# Patient Record
Sex: Female | Born: 1940 | Race: White | Hispanic: No | Marital: Married | Smoking: Never smoker
Health system: Southern US, Community
[De-identification: ages and names within clinical notes are randomized; demographics above are authoritative.]

## PROBLEM LIST (undated history)

## (undated) DIAGNOSIS — M2550 Pain in unspecified joint: Secondary | ICD-10-CM

## (undated) DIAGNOSIS — R3915 Urgency of urination: Secondary | ICD-10-CM

## (undated) DIAGNOSIS — E785 Hyperlipidemia, unspecified: Secondary | ICD-10-CM

## (undated) DIAGNOSIS — I499 Cardiac arrhythmia, unspecified: Secondary | ICD-10-CM

## (undated) DIAGNOSIS — Z950 Presence of cardiac pacemaker: Secondary | ICD-10-CM

## (undated) DIAGNOSIS — I10 Essential (primary) hypertension: Secondary | ICD-10-CM

## (undated) DIAGNOSIS — R350 Frequency of micturition: Secondary | ICD-10-CM

## (undated) DIAGNOSIS — I4891 Unspecified atrial fibrillation: Secondary | ICD-10-CM

## (undated) DIAGNOSIS — Z860100 Personal history of colon polyps, unspecified: Secondary | ICD-10-CM

## (undated) DIAGNOSIS — M1990 Unspecified osteoarthritis, unspecified site: Secondary | ICD-10-CM

## (undated) DIAGNOSIS — E039 Hypothyroidism, unspecified: Secondary | ICD-10-CM

## (undated) DIAGNOSIS — Z9289 Personal history of other medical treatment: Secondary | ICD-10-CM

## (undated) DIAGNOSIS — R197 Diarrhea, unspecified: Secondary | ICD-10-CM

## (undated) HISTORY — PX: OTHER SURGICAL HISTORY: SHX169

## (undated) HISTORY — PX: JOINT REPLACEMENT: SHX530

## (undated) HISTORY — PX: INSERT / REPLACE / REMOVE PACEMAKER: SUR710

## (undated) HISTORY — PX: ABDOMINAL HYSTERECTOMY: SHX81

## (undated) HISTORY — PX: EYE SURGERY: SHX253

## (undated) HISTORY — PX: COLONOSCOPY: SHX174

---

## 1969-12-22 HISTORY — PX: CHOLECYSTECTOMY: SHX55

## 1983-12-23 HISTORY — PX: OTHER SURGICAL HISTORY: SHX169

## 1998-07-03 ENCOUNTER — Inpatient Hospital Stay (HOSPITAL_COMMUNITY): Admission: RE | Admit: 1998-07-03 | Discharge: 1998-07-05 | Payer: Self-pay | Admitting: Gynecology

## 2003-03-29 ENCOUNTER — Encounter: Admission: RE | Admit: 2003-03-29 | Discharge: 2003-06-27 | Payer: Self-pay | Admitting: Internal Medicine

## 2004-07-21 ENCOUNTER — Emergency Department (HOSPITAL_COMMUNITY): Admission: EM | Admit: 2004-07-21 | Discharge: 2004-07-21 | Payer: Self-pay | Admitting: *Deleted

## 2004-09-05 ENCOUNTER — Inpatient Hospital Stay (HOSPITAL_COMMUNITY): Admission: EM | Admit: 2004-09-05 | Discharge: 2004-09-10 | Payer: Self-pay | Admitting: Emergency Medicine

## 2004-10-24 ENCOUNTER — Encounter (HOSPITAL_COMMUNITY): Admission: RE | Admit: 2004-10-24 | Discharge: 2005-01-22 | Payer: Self-pay | Admitting: Interventional Cardiology

## 2009-09-23 DIAGNOSIS — F419 Anxiety disorder, unspecified: Secondary | ICD-10-CM | POA: Insufficient documentation

## 2011-02-24 ENCOUNTER — Encounter (HOSPITAL_COMMUNITY)
Admission: RE | Admit: 2011-02-24 | Discharge: 2011-02-24 | Disposition: A | Discharge status: Home / Self Care | Payer: Medicare Other | Source: Ambulatory Visit | Attending: Orthopedic Surgery | Admitting: Orthopedic Surgery

## 2011-02-24 ENCOUNTER — Ambulatory Visit (HOSPITAL_COMMUNITY)
Admission: RE | Admit: 2011-02-24 | Discharge: 2011-02-24 | Disposition: A | Discharge status: Home / Self Care | Payer: Medicare Other | Source: Ambulatory Visit | Attending: Orthopedic Surgery | Admitting: Orthopedic Surgery

## 2011-02-24 ENCOUNTER — Other Ambulatory Visit (HOSPITAL_COMMUNITY): Payer: Self-pay | Admitting: Orthopedic Surgery

## 2011-02-24 DIAGNOSIS — Z01818 Encounter for other preprocedural examination: Secondary | ICD-10-CM | POA: Insufficient documentation

## 2011-02-24 DIAGNOSIS — M1711 Unilateral primary osteoarthritis, right knee: Secondary | ICD-10-CM

## 2011-02-24 DIAGNOSIS — Z01810 Encounter for preprocedural cardiovascular examination: Secondary | ICD-10-CM | POA: Insufficient documentation

## 2011-02-24 DIAGNOSIS — Z01812 Encounter for preprocedural laboratory examination: Secondary | ICD-10-CM | POA: Insufficient documentation

## 2011-02-24 LAB — DIFFERENTIAL
Basophils Absolute: 0.1 10*3/uL (ref 0.0–0.1)
Basophils Relative: 1 % (ref 0–1)
Eosinophils Absolute: 0.2 10*3/uL (ref 0.0–0.7)
Eosinophils Relative: 3 % (ref 0–5)
Lymphocytes Relative: 26 % (ref 12–46)
Monocytes Absolute: 0.3 10*3/uL (ref 0.1–1.0)
Neutro Abs: 3.8 10*3/uL (ref 1.7–7.7)
Neutrophils Relative %: 65 % (ref 43–77)

## 2011-02-24 LAB — CBC
Hemoglobin: 14.3 g/dL (ref 12.0–15.0)
MCH: 30.2 pg (ref 26.0–34.0)
MCHC: 33.6 g/dL (ref 30.0–36.0)
MCV: 89.7 fL (ref 78.0–100.0)
Platelets: 194 10*3/uL (ref 150–400)
RDW: 13.9 % (ref 11.5–15.5)
WBC: 5.9 10*3/uL (ref 4.0–10.5)

## 2011-02-24 LAB — COMPREHENSIVE METABOLIC PANEL
ALT: 16 U/L (ref 0–35)
AST: 15 U/L (ref 0–37)
Albumin: 4.4 g/dL (ref 3.5–5.2)
Alkaline Phosphatase: 67 U/L (ref 39–117)
BUN: 22 mg/dL (ref 6–23)
Calcium: 10.2 mg/dL (ref 8.4–10.5)
Chloride: 104 mEq/L (ref 96–112)
Creatinine, Ser: 0.94 mg/dL (ref 0.4–1.2)
GFR calc Af Amer: >60 mL/min (ref >60)
Glucose, Bld: 110 mg/dL — ABNORMAL HIGH (ref 70–99)
Potassium: 4.4 mEq/L (ref 3.5–5.1)
Total Bilirubin: 0.9 mg/dL (ref 0.3–1.2)
Total Protein: 7.4 g/dL (ref 6.0–8.3)

## 2011-02-24 LAB — SURGICAL PCR SCREEN
MRSA, PCR: NEGATIVE
Staphylococcus aureus: NEGATIVE

## 2011-02-24 LAB — URINALYSIS, ROUTINE W REFLEX MICROSCOPIC
Ketones, ur: NEGATIVE mg/dL
Nitrite: NEGATIVE
Protein, ur: NEGATIVE mg/dL
Urobilinogen, UA: 0.2 mg/dL (ref 0.0–1.0)

## 2011-02-24 LAB — APTT: aPTT: 31 seconds (ref 24–37)

## 2011-02-24 LAB — PROTIME-INR: INR: 0.99 (ref 0.00–1.49)

## 2011-02-25 LAB — URINE CULTURE
Colony Count: >=100000
Culture  Setup Time: 201203051527

## 2011-03-03 ENCOUNTER — Inpatient Hospital Stay (HOSPITAL_COMMUNITY)
Admission: RE | Admit: 2011-03-03 | Discharge: 2011-03-05 | DRG: 470 | Disposition: A | Discharge status: Home Health | Payer: Medicare Other | Source: Ambulatory Visit | Attending: Orthopedic Surgery | Admitting: Orthopedic Surgery

## 2011-03-03 DIAGNOSIS — M1710 Unilateral primary osteoarthritis, unspecified knee: Principal | ICD-10-CM | POA: Diagnosis present

## 2011-03-03 DIAGNOSIS — I447 Left bundle-branch block, unspecified: Secondary | ICD-10-CM | POA: Diagnosis present

## 2011-03-03 DIAGNOSIS — IMO0002 Reserved for concepts with insufficient information to code with codable children: Principal | ICD-10-CM | POA: Diagnosis present

## 2011-03-03 DIAGNOSIS — I10 Essential (primary) hypertension: Secondary | ICD-10-CM | POA: Diagnosis present

## 2011-03-03 DIAGNOSIS — D62 Acute posthemorrhagic anemia: Secondary | ICD-10-CM | POA: Diagnosis not present

## 2011-03-03 LAB — ABO/RH: ABO/RH(D): O POS

## 2011-03-04 LAB — CBC
HCT: 30.4 % — ABNORMAL LOW (ref 36.0–46.0)
Hemoglobin: 10.1 g/dL — ABNORMAL LOW (ref 12.0–15.0)
MCH: 30.2 pg (ref 26.0–34.0)
MCHC: 33.2 g/dL (ref 30.0–36.0)
Platelets: 141 10*3/uL — ABNORMAL LOW (ref 150–400)
RBC: 3.34 MIL/uL — ABNORMAL LOW (ref 3.87–5.11)
RDW: 14.5 % (ref 11.5–15.5)
WBC: 6.4 10*3/uL (ref 4.0–10.5)

## 2011-03-04 LAB — BASIC METABOLIC PANEL
Calcium: 8.7 mg/dL (ref 8.4–10.5)
GFR calc Af Amer: >60 mL/min (ref >60)
GFR calc non Af Amer: >60 mL/min (ref >60)
Potassium: 3.8 mEq/L (ref 3.5–5.1)
Sodium: 138 mEq/L (ref 135–145)

## 2011-03-05 LAB — BASIC METABOLIC PANEL
BUN: 10 mg/dL (ref 6–23)
CO2: 29 mEq/L (ref 19–32)
Chloride: 104 mEq/L (ref 96–112)
Creatinine, Ser: 0.8 mg/dL (ref 0.4–1.2)
Glucose, Bld: 109 mg/dL — ABNORMAL HIGH (ref 70–99)

## 2011-03-05 LAB — CBC
HCT: 28.4 % — ABNORMAL LOW (ref 36.0–46.0)
Hemoglobin: 9.4 g/dL — ABNORMAL LOW (ref 12.0–15.0)
MCH: 29.7 pg (ref 26.0–34.0)
MCV: 89.9 fL (ref 78.0–100.0)
RBC: 3.16 MIL/uL — ABNORMAL LOW (ref 3.87–5.11)
RDW: 14 % (ref 11.5–15.5)
WBC: 5.1 10*3/uL (ref 4.0–10.5)

## 2011-03-05 NOTE — Op Note (Signed)
Lauren Henry, Lauren Henry                ACCOUNT NO.:  [id number]  MEDICAL RECORD NO.:  [id number]           PATIENT TYPE:  I  LOCATION:  5039                         FACILITY:  [facility]  PHYSICIAN:  Mila Homer. Sherlean Foot, M.D. DATE OF BIRTH:  24-Jul-1941  DATE OF PROCEDURE:  03/03/2011 DATE OF DISCHARGE:                              OPERATIVE REPORT   SURGEON:  Mila Homer. Sherlean Foot, MD  ASSISTANT:  Altamese Cabal, PA-C  ANESTHESIA:  General.  PREOPERATIVE DIAGNOSIS:  Left knee osteoarthritis.  POSTOPERATIVE DIAGNOSIS:  Left knee osteoarthritis.  PROCEDURE:  Left total knee arthroplasty.  INDICATIONS FOR PROCEDURE:  The patient is a 70 year old white female with failure of conservative measures for osteoarthritis of the left knee.  Informed consent was obtained.  PROCEDURE:  The patient was laid supine, administered general anesthesia.  Left leg prepped and draped in usual sterile fashion. Extremity was exsanguinated with the Esmarch and tourniquet inflated to 350 mmHg.  Made a midline incision with a #10 blade.  Used the Nu-Blade to make a medial parapatellar arthrotomy and perform synovectomy.  I then elevated deep MCL off the medial crest of the tibia, everted the patella, measured 24 mm thick.  I reamed down 8.5 mm, drilled 3 lug holes through 32-mm template.  I then went into flexion, subluxing the patella laterally.  We used the extramedullary system to make a perpendicular cut to the anatomic axis of the tibia and 3 degrees posterior slope.  I then used the intramedullary system to make a 6- degree valgus cut on the distal femur.  I marked out the epicondylar axis, posterior condylar angle measured 3 degrees.  I then sized to a size E, pinned in 5 degrees of external rotation matching the posterior condylar angle.  I then made the interposing chamfer cuts through the formal cutting block and sagittal saw.  I then placed a lamina spreader in the knee, removed the ACL, PCL, medial and  lateral menisci, and posterior condylar osteophytes.  At this point, I had good flexion and extension gap balance with a size 12 spacer block.  Finished the femur with a size E finishing block, finished the tibia with a size 3 tibial tray drilling keel.  I then trialed with the E femur, 3 tibia, 12 insert, 32 patella, and had good flexion and extension gap balance dropping the angle back to 125 degrees.  I removed the trial components, copiously irrigated.  I cemented all three components, removing all excess cement, allowing the cement to harden in extension.  Let the tourniquet down after the cement was hardened, obtained hemostasis, irrigated again.  I then closed the arthrotomy with figure-of-eight #1 Vicryl sutures, buried with 0 Vicryl sutures, subcuticular 2-0 Vicryl stitch, leaving the Hemovac coming out superolaterally and deep to the arthrotomy, a pain catheter coming out supermedial and superficial to the arthrotomy.  Closed with skin staples.  Dressed with Xeroform dressing, sponges, sterile Webril, and TED stocking.  COMPLICATIONS:  None.  DRAINS:  One Hemovac and one pain catheter.  ESTIMATED BLOOD LOSS:  300 mL.  TOURNIQUET TIME:  Forty eight minutes.  ______________________________ Mila Homer Sherlean Foot, M.D.     SDL/MEDQ  D:  03/03/2011  T:  03/04/2011  Job:  [id number]  Electronically Signed by Georgena Spurling M.D. on 03/05/2011 11:30:07 AM

## 2011-03-06 LAB — CROSSMATCH
ABO/RH(D): O POS
Antibody Screen: NEGATIVE
Unit division: 0
Unit division: 0

## 2012-01-13 ENCOUNTER — Encounter: Payer: Self-pay | Admitting: Family Medicine

## 2012-01-13 ENCOUNTER — Ambulatory Visit (INDEPENDENT_AMBULATORY_CARE_PROVIDER_SITE_OTHER): Payer: Medicare Other | Admitting: Family Medicine

## 2012-01-13 VITALS — BP 145/77 | HR 73

## 2012-01-13 DIAGNOSIS — R1031 Right lower quadrant pain: Secondary | ICD-10-CM

## 2012-01-13 MED ORDER — MELOXICAM 15 MG PO TABS: 15.0000 mg | ORAL_TABLET | Freq: Every day | ORAL | Status: DC

## 2012-01-13 NOTE — Assessment & Plan Note (Signed)
Suspect muscular strain as negative exam today. LBP rehab exercises. Meloxicam. RTC 2-3 weeks.

## 2012-01-13 NOTE — Progress Notes (Addendum)
  Subjective:    Patient ID: Lauren Henry, female    DOB: [birth year], 71 y.o.   MRN: [id number]  HPI This patient comes in with about 3-4 days of pain in her right groin. She notes no injuries, just that she recently had a long ride from [place]. She noted the pain mostly when getting out of a car. The pain does not radiate, and does not come from her back, and does not go down to her feet. Anti-inflammatories, or home exercise program. It is not worse with coughing, sneezing, or Valsalva maneuvers. She is unaware of it's worse with bending forward bending backwards. She does not limp.  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 Past Surgical History: Right total knee arthroplasty, total hysterectomy, cholecystectomy, appendectomy. Social History: Denies alcohol, tobacco, or drugs, works on a farm. Family History: No family history on file. Allergies: None No Known Allergies Medications: See med rec.  Review of Systems    No fevers, chills, night sweats, weight loss, chest pain, or shortness of breath.   Objective:   Physical Exam  General:  Well developed, well nourished, and in no acute distress. Neuro:  Alert and oriented x3, extra-ocular muscles intact. Skin: Warm and dry, no rashes noted. Respiratory:  Not using accessory muscles, speaking in full sentences. Hip: ROM IR: 45 Deg, ER: 45 Deg, Flexion: 120 Deg, Extension: 100 Deg, Abduction: 45 Deg, Adduction: 45 Deg Strength IR: 5/5, ER: 5/5, Flexion: 5/5, Extension: 5/5, Abduction: 5/5, Adduction: 5/5 Pelvic alignment unremarkable to inspection and palpation. Standing hip rotation and gait without trendelenburg sign / unsteadiness. Greater trochanter without tenderness to palpation. No tenderness over piriformis and greater trochanter. No pain with FABER or FADIR. No SI joint tenderness and  normal minimal SI movement.  Back Exam: Inspection: Unremarkable Motion: Flexion 45 deg, Extension 45 deg, Side Bending to 45 deg bilaterally,  Rotation to 45 deg bilaterally SLR laying:  Negative XSLR laying: Negative Palpable tenderness: None FABER: negative Sensory change: Gross sensation intact to all lumbar and sacral dermatomes. Reflexes: 2+ at both patellar tendons, 2+ at achilles tendons, Babinski's downgoing.  Strength at foot Plantar-flexion: 5/5    Dorsi-flexion: 5/5    Eversion: 5/5   Inversion: 5/5 Leg strength Quad: 5/5   Hamstring: 5/5   Hip flexor: 5/5   Hip abductors: 5/5 Gait unremarkable.  Muscular skeletal ultrasound: I was unable to find any hernias, either clinically nor radiographically. I also imaged her femoral head, neck, and acetabulum. There were no abnormalities noted, and no effusion noted in the joint. Images saved.    Assessment & Plan:

## 2012-01-13 NOTE — Patient Instructions (Signed)
Great to see you [facility] Jora Galluzzo,  Looks like a muscle strain. Anti-inflammatories, then come back to see [place] in 2-3 weeks. Do some rehab exercises that I will give you.   Ihor Austin. Benjamin Stain, M.D. [facility] [address], [place] [address] [phone]

## 2012-01-27 ENCOUNTER — Ambulatory Visit (INDEPENDENT_AMBULATORY_CARE_PROVIDER_SITE_OTHER): Payer: Medicare Other | Admitting: Sports Medicine

## 2012-01-27 VITALS — BP 150/80

## 2012-01-27 DIAGNOSIS — Z09 Encounter for follow-up examination after completed treatment for conditions other than malignant neoplasm: Secondary | ICD-10-CM

## 2012-01-27 DIAGNOSIS — R1031 Right lower quadrant pain: Secondary | ICD-10-CM

## 2012-01-27 NOTE — Progress Notes (Signed)
  Subjective:    Patient ID: Lauren Henry, female    DOB: 1941/12/19, 71 y.o.   MRN: [id number]  HPI Terena comes back to see me today to 3 weeks after she had some right hip and groin pain after a long car ride. At that point her exam was essentially negative, and her hip exam did not give me any suspicion for osteoarthritis. Today she is essentially 100% better, is taking meloxicam, and is doing a few of the rehabilitation exercises. She is very happy with the results.   Review of Systems    No fevers, chills, night sweats, weight loss, chest pain, or shortness of breath.  Objective:   Physical Exam General:  Well developed, well nourished, and in no acute distress. Neuro:  Alert and oriented x3, extra-ocular muscles intact. Skin: Warm and dry, no rashes noted. Respiratory:  Not using accessory muscles, speaking in full sentences. Musculoskeletal: Hip: ROM IR: 45 Deg, ER: 45 Deg, Flexion: 120 Deg, Extension: 100 Deg, Abduction: 45 Deg, Adduction: 45 Deg Strength IR: 5/5, ER: 5/5, Flexion: 5/5, Extension: 5/5, Abduction: 5/5, Adduction: 5/5 Pelvic alignment unremarkable to inspection and palpation. Standing hip rotation and gait without trendelenburg sign / unsteadiness. Greater trochanter without tenderness to palpation. No tenderness over piriformis and greater trochanter. No pain with FABER or FADIR. No SI joint tenderness and normal minimal SI movement.     Assessment & Plan:

## 2012-01-27 NOTE — Assessment & Plan Note (Signed)
Symptoms currently resolved. She may stop a meloxicam and use as needed. I would like her to continue doing the rehabilitation exercises. We will see her back on an as-needed basis.

## 2012-08-27 ENCOUNTER — Ambulatory Visit
Admission: RE | Admit: 2012-08-27 | Discharge: 2012-08-27 | Disposition: A | Discharge status: Home / Self Care | Payer: Medicare Other | Source: Ambulatory Visit | Attending: Family Medicine | Admitting: Family Medicine

## 2012-08-27 ENCOUNTER — Ambulatory Visit (INDEPENDENT_AMBULATORY_CARE_PROVIDER_SITE_OTHER): Payer: Medicare Other | Admitting: Family Medicine

## 2012-08-27 VITALS — BP 182/93 | Ht 64.0 in | Wt 185.0 lb

## 2012-08-27 DIAGNOSIS — M549 Dorsalgia, unspecified: Secondary | ICD-10-CM | POA: Insufficient documentation

## 2012-08-27 MED ORDER — TRAMADOL HCL 50 MG PO TABS: 50.0000 mg | ORAL_TABLET | Freq: Four times a day (QID) | ORAL | Status: AC | PRN

## 2012-08-27 NOTE — Patient Instructions (Addendum)
Thank you for coming in today. I think you have a back muscle strain. We are checking to make sure you do not have compression fracture.  Take tramadol as needed for pain.  Make sure it is not making loopy before you drive with it. I will call you with the results of your x-ray.  Let's give it a few days.  If you're not better call me and we will arrange for formal physical therapy

## 2012-08-27 NOTE — Assessment & Plan Note (Signed)
Low back pain suspicious for muscular strain. However secondary to her age in thoracic kyphosis I am concerned about the possibility of an atraumatic vertebral compression fracture.  Plan to obtain thoracic and lumbar two-view x-rays to assess for compression fracture.  Additionally we'll treat with tramadol for pain control. Would like to avoid high doses of NSAIDs secondary to her elevated blood pressure.  Recommended that she talk to her primary care doctor about further blood pressure control. We'll followup over the phone after x-rays. If mechanical/muscular strain would recommend rest and watchful waiting followed by physical therapy if not improving. If compression fracture would treat with calcitonin and consideration for referral for kyphoplasty. Discussed warning signs or symptoms. Please see discharge instructions. Patient expresses understanding.

## 2012-08-27 NOTE — Progress Notes (Signed)
Lauren Henry is a 71 y.o. female who presents to [facility] today for back pain.  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  3 weeks ago she noted pain in her low thoracic and high lumbar back.  She denies any trauma.  Additionally she denies any radiating pain weakness numbness difficulty walking or bowel or bladder dysfunction.  She is taking Aleve which is somewhat helpful.  She thinks her pain is improving.     PMH reviewed. Significant for hypertension, hypothyroidism, hyperlipidemia. Recently had a normal bone density scan [facility] History  Substance Use Topics  . Smoking status: Never Smoker   . Smokeless tobacco: Never Used  . Alcohol Use: Not on file   ROS as above otherwise neg   Exam:  BP 182/93  Ht 5\' 4"  (1.626 m)  Wt 185 lb (83.915 kg)  BMI 31.76 kg/m2 Gen: Well NAD Neck: Patient has thoracic kyphosis with neck extension MSK: Back: Nontender over spinal midline. Mildly tender over left paraspinal muscles in the area of the low thoracic or hi lumbar back.    Normal back motion.  Neuro: Alert and oriented. Reflexes 2+ and equal bilaterally.  Strength intact to hip flexion, knee flexion and extension, ankle and great toe dorsal and plantar flexion Sensation is intact distally. Patient and get onto and off exam table easily

## 2012-08-30 ENCOUNTER — Telehealth: Payer: Self-pay | Admitting: Family Medicine

## 2012-08-30 NOTE — Telephone Encounter (Signed)
Amy plz call her re her back x rays---they showed a LOT of arthritic change and some really degenerative changes in the area where she hurts--it did NOT show any compression fracture---taht is good news. If herpain is getting better, I would do nothing different. If herpain remains the same but is handled by tramadol, I am very OK refilling that  [facility]! Denny Levy PS she saw Dr Denyse Amass (with me)

## 2012-08-30 NOTE — Telephone Encounter (Signed)
Left pt a VM to return my call  

## 2012-09-01 NOTE — Telephone Encounter (Signed)
Spoke with pt- gave her message from Dr. Jennette Kettle.  She states the tramadol does help her pain, but she has not taken it often.  Advised to take as needed, and Dr. Jennette Kettle would refill.  Pt agreeable.

## 2012-09-01 NOTE — Telephone Encounter (Signed)
Left pt a VM to return my call  

## 2013-01-31 DIAGNOSIS — M25569 Pain in unspecified knee: Secondary | ICD-10-CM | POA: Insufficient documentation

## 2014-01-18 ENCOUNTER — Ambulatory Visit
Admission: RE | Admit: 2014-01-18 | Discharge: 2014-01-18 | Disposition: A | Discharge status: Home / Self Care | Payer: Medicare Other | Source: Ambulatory Visit | Attending: Family Medicine | Admitting: Family Medicine

## 2014-01-18 ENCOUNTER — Other Ambulatory Visit: Payer: Self-pay | Admitting: Family Medicine

## 2014-01-18 DIAGNOSIS — Z006 Encounter for examination for normal comparison and control in clinical research program: Secondary | ICD-10-CM

## 2014-09-14 DIAGNOSIS — Z96659 Presence of unspecified artificial knee joint: Secondary | ICD-10-CM | POA: Insufficient documentation

## 2014-09-14 DIAGNOSIS — M5441 Lumbago with sciatica, right side: Secondary | ICD-10-CM | POA: Insufficient documentation

## 2014-09-21 ENCOUNTER — Other Ambulatory Visit: Payer: Self-pay | Admitting: Orthopedic Surgery

## 2014-10-17 ENCOUNTER — Encounter (HOSPITAL_COMMUNITY): Payer: Self-pay | Admitting: Pharmacy Technician

## 2014-10-19 ENCOUNTER — Ambulatory Visit (HOSPITAL_COMMUNITY)
Admission: RE | Admit: 2014-10-19 | Discharge: 2014-10-19 | Disposition: A | Discharge status: Home / Self Care | Payer: Medicare HMO | Source: Ambulatory Visit | Attending: Orthopedic Surgery | Admitting: Orthopedic Surgery

## 2014-10-19 ENCOUNTER — Encounter (HOSPITAL_COMMUNITY): Payer: Self-pay | Admitting: Anesthesiology

## 2014-10-19 ENCOUNTER — Encounter (HOSPITAL_COMMUNITY)
Admission: RE | Admit: 2014-10-19 | Discharge: 2014-10-19 | Disposition: A | Discharge status: Home / Self Care | Payer: Medicare HMO | Source: Ambulatory Visit | Attending: Orthopedic Surgery | Admitting: Orthopedic Surgery

## 2014-10-19 ENCOUNTER — Encounter (HOSPITAL_COMMUNITY): Payer: Self-pay | Admitting: Vascular Surgery

## 2014-10-19 ENCOUNTER — Encounter (HOSPITAL_COMMUNITY): Payer: Self-pay

## 2014-10-19 DIAGNOSIS — I10 Essential (primary) hypertension: Secondary | ICD-10-CM | POA: Insufficient documentation

## 2014-10-19 DIAGNOSIS — Z01811 Encounter for preprocedural respiratory examination: Secondary | ICD-10-CM | POA: Insufficient documentation

## 2014-10-19 DIAGNOSIS — I447 Left bundle-branch block, unspecified: Secondary | ICD-10-CM | POA: Insufficient documentation

## 2014-10-19 DIAGNOSIS — Z01810 Encounter for preprocedural cardiovascular examination: Secondary | ICD-10-CM | POA: Insufficient documentation

## 2014-10-19 DIAGNOSIS — Z01818 Encounter for other preprocedural examination: Secondary | ICD-10-CM

## 2014-10-19 HISTORY — DX: Personal history of other medical treatment: Z92.89

## 2014-10-19 HISTORY — DX: Unspecified osteoarthritis, unspecified site: M19.90

## 2014-10-19 HISTORY — DX: Personal history of colon polyps, unspecified: Z86.0100

## 2014-10-19 HISTORY — DX: Hypothyroidism, unspecified: E03.9

## 2014-10-19 HISTORY — DX: Urgency of urination: R39.15

## 2014-10-19 HISTORY — DX: Hyperlipidemia, unspecified: E78.5

## 2014-10-19 HISTORY — DX: Diarrhea, unspecified: R19.7

## 2014-10-19 HISTORY — DX: Personal history of colonic polyps: Z86.010

## 2014-10-19 HISTORY — DX: Essential (primary) hypertension: I10

## 2014-10-19 HISTORY — DX: Pain in unspecified joint: M25.50

## 2014-10-19 HISTORY — DX: Frequency of micturition: R35.0

## 2014-10-19 LAB — URINALYSIS, ROUTINE W REFLEX MICROSCOPIC
Bilirubin Urine: NEGATIVE
Glucose, UA: NEGATIVE mg/dL
Hgb urine dipstick: NEGATIVE
Ketones, ur: NEGATIVE mg/dL
Nitrite: NEGATIVE
PH: 5.5 (ref 5.0–8.0)
Protein, ur: NEGATIVE mg/dL
Specific Gravity, Urine: 1.014 (ref 1.005–1.030)
UROBILINOGEN UA: 0.2 mg/dL (ref 0.0–1.0)

## 2014-10-19 LAB — CBC WITH DIFFERENTIAL/PLATELET
Basophils Absolute: 0 10*3/uL (ref 0.0–0.1)
Basophils Relative: 1 % (ref 0–1)
Eosinophils Absolute: 0.3 10*3/uL (ref 0.0–0.7)
Eosinophils Relative: 4 % (ref 0–5)
HCT: 38.5 % (ref 36.0–46.0)
Hemoglobin: 12.9 g/dL (ref 12.0–15.0)
LYMPHS ABS: 1.6 10*3/uL (ref 0.7–4.0)
Lymphocytes Relative: 27 % (ref 12–46)
MCH: 30.1 pg (ref 26.0–34.0)
MCHC: 33.5 g/dL (ref 30.0–36.0)
MCV: 90 fL (ref 78.0–100.0)
Monocytes Absolute: 0.3 10*3/uL (ref 0.1–1.0)
Monocytes Relative: 5 % (ref 3–12)
Neutro Abs: 3.8 10*3/uL (ref 1.7–7.7)
Neutrophils Relative %: 63 % (ref 43–77)
PLATELETS: 203 10*3/uL (ref 150–400)
RBC: 4.28 MIL/uL (ref 3.87–5.11)
RDW: 13.6 % (ref 11.5–15.5)
WBC: 6 10*3/uL (ref 4.0–10.5)

## 2014-10-19 LAB — PROTIME-INR
INR: 1.07 (ref 0.00–1.49)
Prothrombin Time: 14.1 seconds (ref 11.6–15.2)

## 2014-10-19 LAB — COMPREHENSIVE METABOLIC PANEL
ALT: 17 U/L (ref 0–35)
ANION GAP: 15 (ref 5–15)
AST: 16 U/L (ref 0–37)
Albumin: 4.5 g/dL (ref 3.5–5.2)
Alkaline Phosphatase: 72 U/L (ref 39–117)
BUN: 26 mg/dL — ABNORMAL HIGH (ref 6–23)
CALCIUM: 10.3 mg/dL (ref 8.4–10.5)
CO2: 25 mEq/L (ref 19–32)
Chloride: 101 mEq/L (ref 96–112)
Creatinine, Ser: 0.95 mg/dL (ref 0.50–1.10)
GFR calc Af Amer: 67 mL/min — ABNORMAL LOW (ref >90)
GFR calc non Af Amer: 58 mL/min — ABNORMAL LOW (ref >90)
Glucose, Bld: 100 mg/dL — ABNORMAL HIGH (ref 70–99)
Potassium: 3.9 mEq/L (ref 3.7–5.3)
SODIUM: 141 meq/L (ref 137–147)
Total Bilirubin: 0.4 mg/dL (ref 0.3–1.2)
Total Protein: 7.7 g/dL (ref 6.0–8.3)

## 2014-10-19 LAB — SURGICAL PCR SCREEN
MRSA, PCR: NEGATIVE
Staphylococcus aureus: NEGATIVE

## 2014-10-19 LAB — APTT: aPTT: 33 seconds (ref 24–37)

## 2014-10-19 LAB — URINE MICROSCOPIC-ADD ON

## 2014-10-19 MED ORDER — CHLORHEXIDINE GLUCONATE 4 % EX LIQD: 60.0000 mL | Freq: Once | CUTANEOUS | Status: DC

## 2014-10-19 NOTE — Pre-Procedure Instructions (Signed)
Lauren HitchSandra D Henry  10/19/2014   Your procedure is scheduled on:  Mon, Nov 9 @ 7:30 AM  [facility]  at 5:30 AM.  Call this number if you have problems the morning of surgery: [phone]   Remember:   Do not eat food or drink liquids after midnight.   Take these medicines the morning of surgery with A SIP OF WATER: Amlodipine-Olmesartan(Azor),Carvedilol(Coreg),and Synthroid(Levothyroxine)               Stop taking your Aspirin. No Goody's,BC's,Aleve,Ibuprofen,Fish Oil,or any Herbal Medications   Do not wear jewelry, make-up or nail polish.  Do not wear lotions, powders, or perfumes. You may wear deodorant.  Do not shave 48 hours prior to surgery.   Do not bring valuables to the hospital.  [facility] is not responsible                  for any belongings or valuables.               Contacts, dentures or bridgework may not be worn into surgery.  Leave suitcase in the car. After surgery it may be brought to your room.  For patients admitted to the hospital, discharge time is determined by your                treatment team.               Special Instructions:  [place] - Preparing for Surgery  Before surgery, you can play an important role.  Because skin is not sterile, your skin needs to be as free of germs as possible.  You can reduce the number of germs on you skin by washing with CHG (chlorahexidine gluconate) soap before surgery.  CHG is an antiseptic cleaner which kills germs and bonds with the skin to continue killing germs even after washing.  Please DO NOT use if you have an allergy to CHG or antibacterial soaps.  If your skin becomes reddened/irritated stop using the CHG and inform your nurse when you arrive at Short Stay.  Do not shave (including legs and underarms) for at least 48 hours prior to the first CHG shower.  You may shave your face.  Please follow these instructions carefully:   1.  Shower with CHG Soap the night before surgery and the                                 morning of Surgery.  2.  If you choose to wash your hair, wash your hair first as usual with your       normal shampoo.  3.  After you shampoo, rinse your hair and body thoroughly to remove the                      Shampoo.  4.  Use CHG as you would any other liquid soap.  You can apply chg directly       to the skin and wash gently with scrungie or a clean washcloth.  5.  Apply the CHG Soap to your body ONLY FROM THE NECK DOWN.        Do not use on open wounds or open sores.  Avoid contact with your eyes,       ears, mouth and genitals (private parts).  Wash genitals (private parts)       with your  normal soap.  6.  Wash thoroughly, paying special attention to the area where your surgery        will be performed.  7.  Thoroughly rinse your body with warm water from the neck down.  8.  DO NOT shower/wash with your normal soap after using and rinsing off       the CHG Soap.  9.  Pat yourself dry with a clean towel.            10.  Wear clean pajamas.            11.  Place clean sheets on your bed the night of your first shower and do not        sleep with pets.  Day of Surgery  Do not apply any lotions/deoderants the morning of surgery.  Please wear clean clothes to the hospital/surgery center.     Please read over the following fact sheets that you were given: Pain Booklet, Coughing and Deep Breathing, MRSA Information and Surgical Site Infection Prevention

## 2014-10-19 NOTE — Progress Notes (Addendum)
Pt doesn't have a cardiologist  Denies ever having a stress test/heart cath  Denies CXR or EKG in past yr  Medical MD is Dr.Richard Tisovec  Echo report under media tab from 2005

## 2014-10-19 NOTE — Progress Notes (Signed)
Left message with Dr.Lucey for pts medical clearance

## 2014-10-20 NOTE — Progress Notes (Addendum)
Anesthesia Chart Review: Pt is 73 year old female scheduled for L total knee arthroplasty on 10/30/2014 with Dr. Sherlean FootLucey.   PMH: HTN, Graves' disease, hypothyroidism, hyperlipidemia. Never smoker. BMI 31  Medications include: ASA, carvedilol, amlodipine/olmesartan, levothyroxine, pravastatin  Preoperative labs reviewed.    Chest x-ray reviewed. No edema or consolidation.  EKG: NSR. LBBB  Pt has known LBBB. Is seen on EKG from 2005. EKG is stable over last 10 years. In 2005 and 2006 pt [facility]yes [facility]Kersey [facility] for afib. Dr. Silva BandyKersey's progress note dated 01/02/2005 reports EKG with LBBB (on paper chart). Pt also had echo in 2005 showing normal LV function, mild aortic sclerosis (no stenosis), trace MR and TR. Pt had total knee arthroplasty in 2012 without apparent complications.   Pt has medical clearance on chart from PCP Dr. Wylene Simmerisovec.   Given pt's EKG/LBBB appears to be stable over last 10 years and that pt was seen by cardiology in 2005-2006, and that she has medical clearance for surgery, I anticipate she can proceed with surgery as scheduled.   Lauren Mastngela Marshe Shrestha, FNP-BC [facility]Anesthesiology Phone: [phone] 10/20/2014 1:29 PM

## 2014-10-21 LAB — URINE CULTURE: Colony Count: 75000

## 2014-10-29 MED ORDER — BUPIVACAINE LIPOSOME 1.3 % IJ SUSP
20.0000 mL | Freq: Once | INTRAMUSCULAR | Status: DC
  Filled 2014-10-29: qty 20

## 2014-10-29 MED ORDER — TRANEXAMIC ACID 100 MG/ML IV SOLN
1000.0000 mg | INTRAVENOUS | Status: DC
  Filled 2014-10-29: qty 10

## 2014-10-29 MED ORDER — CEFAZOLIN SODIUM-DEXTROSE 2-3 GM-% IV SOLR
2.0000 g | INTRAVENOUS | Status: DC
  Filled 2014-10-29: qty 50

## 2014-10-29 MED ORDER — SODIUM CHLORIDE 0.9 % IV SOLN
INTRAVENOUS | Status: DC
Start: 2014-10-29 — End: 2014-10-30

## 2014-10-30 ENCOUNTER — Encounter (HOSPITAL_COMMUNITY): Payer: Self-pay | Admitting: *Deleted

## 2014-10-30 ENCOUNTER — Encounter (HOSPITAL_COMMUNITY)
Admission: RE | Disposition: A | Discharge status: Other Acute Inpatient Hospital | Payer: Self-pay | Source: Ambulatory Visit | Attending: Orthopedic Surgery

## 2014-10-30 ENCOUNTER — Inpatient Hospital Stay (HOSPITAL_COMMUNITY)
Admission: EM | Admit: 2014-10-30 | Discharge: 2014-11-02 | DRG: 244 | Disposition: A | Discharge status: Home / Self Care | Payer: Medicare PPO | Attending: Cardiovascular Disease | Admitting: Cardiovascular Disease

## 2014-10-30 ENCOUNTER — Ambulatory Visit (HOSPITAL_COMMUNITY)
Admission: RE | Admit: 2014-10-30 | Discharge: 2014-10-30 | Disposition: A | Discharge status: Other Acute Inpatient Hospital | Payer: Medicare PPO | Source: Ambulatory Visit | Attending: Orthopedic Surgery | Admitting: Orthopedic Surgery

## 2014-10-30 ENCOUNTER — Emergency Department (HOSPITAL_COMMUNITY): Payer: Medicare PPO

## 2014-10-30 DIAGNOSIS — Z79899 Other long term (current) drug therapy: Secondary | ICD-10-CM

## 2014-10-30 DIAGNOSIS — I442 Atrioventricular block, complete: Principal | ICD-10-CM | POA: Diagnosis present

## 2014-10-30 DIAGNOSIS — R001 Bradycardia, unspecified: Secondary | ICD-10-CM | POA: Diagnosis present

## 2014-10-30 DIAGNOSIS — E039 Hypothyroidism, unspecified: Secondary | ICD-10-CM | POA: Diagnosis present

## 2014-10-30 DIAGNOSIS — M1990 Unspecified osteoarthritis, unspecified site: Secondary | ICD-10-CM | POA: Diagnosis present

## 2014-10-30 DIAGNOSIS — E785 Hyperlipidemia, unspecified: Secondary | ICD-10-CM | POA: Diagnosis present

## 2014-10-30 DIAGNOSIS — I10 Essential (primary) hypertension: Secondary | ICD-10-CM

## 2014-10-30 DIAGNOSIS — Z7982 Long term (current) use of aspirin: Secondary | ICD-10-CM | POA: Diagnosis not present

## 2014-10-30 DIAGNOSIS — Z950 Presence of cardiac pacemaker: Secondary | ICD-10-CM

## 2014-10-30 DIAGNOSIS — I059 Rheumatic mitral valve disease, unspecified: Secondary | ICD-10-CM

## 2014-10-30 HISTORY — DX: Unspecified atrial fibrillation: I48.91

## 2014-10-30 LAB — CBC WITH DIFFERENTIAL/PLATELET
Basophils Absolute: 0 10*3/uL (ref 0.0–0.1)
Basophils Relative: 1 % (ref 0–1)
Eosinophils Absolute: 0.1 10*3/uL (ref 0.0–0.7)
Eosinophils Relative: 2 % (ref 0–5)
HCT: 38.3 % (ref 36.0–46.0)
Hemoglobin: 13 g/dL (ref 12.0–15.0)
Lymphocytes Relative: 22 % (ref 12–46)
Lymphs Abs: 1.4 10*3/uL (ref 0.7–4.0)
MCH: 31.2 pg (ref 26.0–34.0)
MCHC: 33.9 g/dL (ref 30.0–36.0)
MCV: 91.8 fL (ref 78.0–100.0)
Monocytes Absolute: 0.4 10*3/uL (ref 0.1–1.0)
Monocytes Relative: 5 % (ref 3–12)
Neutro Abs: 4.7 10*3/uL (ref 1.7–7.7)
Neutrophils Relative %: 70 % (ref 43–77)
Platelets: 188 10*3/uL (ref 150–400)
RBC: 4.17 MIL/uL (ref 3.87–5.11)
RDW: 13.9 % (ref 11.5–15.5)
WBC: 6.7 10*3/uL (ref 4.0–10.5)

## 2014-10-30 LAB — PRO B NATRIURETIC PEPTIDE: PRO B NATRI PEPTIDE: 3052 pg/mL — AB (ref 0–125)

## 2014-10-30 LAB — COMPREHENSIVE METABOLIC PANEL
ALT: 31 U/L (ref 0–35)
AST: 19 U/L (ref 0–37)
Albumin: 4.1 g/dL (ref 3.5–5.2)
Alkaline Phosphatase: 60 U/L (ref 39–117)
Anion gap: 16 — ABNORMAL HIGH (ref 5–15)
BUN: 31 mg/dL — AB (ref 6–23)
CALCIUM: 9.6 mg/dL (ref 8.4–10.5)
CO2: 22 meq/L (ref 19–32)
CREATININE: 1.14 mg/dL — AB (ref 0.50–1.10)
Chloride: 104 mEq/L (ref 96–112)
GFR, EST AFRICAN AMERICAN: 54 mL/min — AB (ref >90)
GFR, EST NON AFRICAN AMERICAN: 47 mL/min — AB (ref >90)
GLUCOSE: 109 mg/dL — AB (ref 70–99)
Potassium: 4.2 mEq/L (ref 3.7–5.3)
Sodium: 142 mEq/L (ref 137–147)
Total Bilirubin: 0.6 mg/dL (ref 0.3–1.2)
Total Protein: 6.9 g/dL (ref 6.0–8.3)

## 2014-10-30 LAB — BASIC METABOLIC PANEL
Anion gap: 18 — ABNORMAL HIGH (ref 5–15)
BUN: 32 mg/dL — ABNORMAL HIGH (ref 6–23)
CO2: 20 mEq/L (ref 19–32)
Calcium: 9.9 mg/dL (ref 8.4–10.5)
Chloride: 104 mEq/L (ref 96–112)
Creatinine, Ser: 1.15 mg/dL — ABNORMAL HIGH (ref 0.50–1.10)
GFR calc Af Amer: 53 mL/min — ABNORMAL LOW (ref >90)
GFR calc non Af Amer: 46 mL/min — ABNORMAL LOW (ref >90)
Glucose, Bld: 119 mg/dL — ABNORMAL HIGH (ref 70–99)
Potassium: 4.3 mEq/L (ref 3.7–5.3)
Sodium: 142 mEq/L (ref 137–147)

## 2014-10-30 LAB — TROPONIN I
Troponin I: <0.3 ng/mL (ref <0.30)
Troponin I: <0.3 ng/mL (ref <0.30)
Troponin I: <0.3 ng/mL (ref <0.30)
Troponin I: <0.3 ng/mL (ref <0.30)

## 2014-10-30 LAB — T4, FREE: FREE T4: 2.41 ng/dL — AB (ref 0.80–1.80)

## 2014-10-30 LAB — TSH: TSH: 3.39 u[IU]/mL (ref 0.350–4.500)

## 2014-10-30 LAB — PROTIME-INR
INR: 1.15 (ref 0.00–1.49)
PROTHROMBIN TIME: 14.8 s (ref 11.6–15.2)

## 2014-10-30 LAB — MAGNESIUM: Magnesium: 1.8 mg/dL (ref 1.5–2.5)

## 2014-10-30 LAB — APTT: APTT: 29 s (ref 24–37)

## 2014-10-30 SURGERY — ARTHROPLASTY, KNEE, TOTAL
Anesthesia: Choice

## 2014-10-30 MED ORDER — MIDAZOLAM HCL 2 MG/2ML IJ SOLN
INTRAMUSCULAR | Status: AC
  Filled 2014-10-30: qty 2

## 2014-10-30 MED ORDER — ASPIRIN EC 81 MG PO TBEC
81.0000 mg | DELAYED_RELEASE_TABLET | Freq: Every day | ORAL | Status: DC
  Administered 2014-10-31 – 2014-11-01 (×2): 81 mg via ORAL
  Filled 2014-10-30 (×2): qty 1

## 2014-10-30 MED ORDER — ONDANSETRON HCL 4 MG/2ML IJ SOLN
4.0000 mg | Freq: Four times a day (QID) | INTRAMUSCULAR | Status: DC | PRN
  Administered 2014-10-30: 4 mg via INTRAVENOUS
  Filled 2014-10-30: qty 2

## 2014-10-30 MED ORDER — ATORVASTATIN CALCIUM 20 MG PO TABS
20.0000 mg | ORAL_TABLET | Freq: Every day | ORAL | Status: DC
  Administered 2014-10-30 – 2014-10-31 (×2): 20 mg via ORAL
  Filled 2014-10-30 (×3): qty 1

## 2014-10-30 MED ORDER — PROPOFOL 10 MG/ML IV BOLUS
INTRAVENOUS | Status: AC
  Filled 2014-10-30: qty 20

## 2014-10-30 MED ORDER — NITROGLYCERIN 0.4 MG SL SUBL: 0.4000 mg | SUBLINGUAL_TABLET | SUBLINGUAL | Status: DC | PRN

## 2014-10-30 MED ORDER — SODIUM CHLORIDE 0.9 % IV SOLN
INTRAVENOUS | Status: DC
  Administered 2014-10-30 – 2014-11-01 (×5): via INTRAVENOUS

## 2014-10-30 MED ORDER — ACETAMINOPHEN 325 MG PO TABS
650.0000 mg | ORAL_TABLET | ORAL | Status: DC | PRN
  Administered 2014-11-01 – 2014-11-02 (×2): 650 mg via ORAL
  Filled 2014-10-30 (×2): qty 2

## 2014-10-30 MED ORDER — FENTANYL CITRATE 0.05 MG/ML IJ SOLN
INTRAMUSCULAR | Status: AC
  Filled 2014-10-30: qty 5

## 2014-10-30 SURGICAL SUPPLY — 55 items
BANDAGE ESMARK 6X9 LF (GAUZE/BANDAGES/DRESSINGS) ×2 IMPLANT
BLADE SAGITTAL 13X1.27X60 (BLADE) ×3 IMPLANT
BLADE SAW SGTL 83.5X18.5 (BLADE) ×3 IMPLANT
BLADE SURG 10 STRL SS (BLADE) ×3 IMPLANT
BNDG CMPR 9X6 STRL LF SNTH (GAUZE/BANDAGES/DRESSINGS) ×1
BNDG ESMARK 6X9 LF (GAUZE/BANDAGES/DRESSINGS) ×2
BOWL SMART MIX CTS (DISPOSABLE) ×3 IMPLANT
COVER SURGICAL LIGHT HANDLE (MISCELLANEOUS) ×3 IMPLANT
CUFF TOURNIQUET SINGLE 34IN LL (TOURNIQUET CUFF) ×3 IMPLANT
DRAPE EXTREMITY T 121X128X90 (DRAPE) ×3 IMPLANT
DRAPE IMP U-DRAPE 54X76 (DRAPES) ×3 IMPLANT
DRAPE INCISE IOBAN 66X45 STRL (DRAPES) ×6 IMPLANT
DRAPE PROXIMA HALF (DRAPES) IMPLANT
DRAPE U-SHAPE 47X51 STRL (DRAPES) ×3 IMPLANT
DRSG ADAPTIC 3X8 NADH LF (GAUZE/BANDAGES/DRESSINGS) ×3 IMPLANT
DRSG PAD ABDOMINAL 8X10 ST (GAUZE/BANDAGES/DRESSINGS) ×3 IMPLANT
DURAPREP 26ML APPLICATOR (WOUND CARE) ×6 IMPLANT
ELECT REM PT RETURN 9FT ADLT (ELECTROSURGICAL) ×2
ELECTRODE REM PT RTRN 9FT ADLT (ELECTROSURGICAL) ×2 IMPLANT
EVACUATOR 1/8 PVC DRAIN (DRAIN) ×3 IMPLANT
GAUZE SPONGE 4X4 12PLY STRL (GAUZE/BANDAGES/DRESSINGS) ×3 IMPLANT
GLOVE BIOGEL M 7.0 STRL (GLOVE) IMPLANT
GLOVE BIOGEL PI IND STRL 7.5 (GLOVE) IMPLANT
GLOVE BIOGEL PI IND STRL 8.5 (GLOVE) ×4 IMPLANT
GLOVE BIOGEL PI INDICATOR 7.5 (GLOVE)
GLOVE BIOGEL PI INDICATOR 8.5 (GLOVE) ×2
GLOVE SURG ORTHO 8.0 STRL STRW (GLOVE) ×6 IMPLANT
GOWN STRL REUS W/ TWL LRG LVL3 (GOWN DISPOSABLE) ×2 IMPLANT
GOWN STRL REUS W/ TWL XL LVL3 (GOWN DISPOSABLE) ×4 IMPLANT
GOWN STRL REUS W/TWL LRG LVL3 (GOWN DISPOSABLE) ×2
GOWN STRL REUS W/TWL XL LVL3 (GOWN DISPOSABLE) ×4
HANDPIECE INTERPULSE COAX TIP (DISPOSABLE) ×2
HOOD PEEL AWAY FACE SHEILD DIS (HOOD) ×9 IMPLANT
KIT BASIN OR (CUSTOM PROCEDURE TRAY) ×3 IMPLANT
KIT ROOM TURNOVER OR (KITS) ×3 IMPLANT
MANIFOLD NEPTUNE II (INSTRUMENTS) ×3 IMPLANT
NEEDLE 22X1 1/2 (OR ONLY) (NEEDLE) ×6 IMPLANT
NS IRRIG 1000ML POUR BTL (IV SOLUTION) ×3 IMPLANT
PACK TOTAL JOINT (CUSTOM PROCEDURE TRAY) ×3 IMPLANT
PACK UNIVERSAL I (CUSTOM PROCEDURE TRAY) ×3 IMPLANT
PAD ARMBOARD 7.5X6 YLW CONV (MISCELLANEOUS) ×6 IMPLANT
PADDING CAST COTTON 6X4 STRL (CAST SUPPLIES) ×3 IMPLANT
SET HNDPC FAN SPRY TIP SCT (DISPOSABLE) ×2 IMPLANT
STAPLER VISISTAT 35W (STAPLE) ×3 IMPLANT
SUCTION FRAZIER TIP 10 FR DISP (SUCTIONS) ×3 IMPLANT
SUT BONE WAX W31G (SUTURE) ×3 IMPLANT
SUT VIC AB 0 CTB1 27 (SUTURE) ×6 IMPLANT
SUT VIC AB 1 CT1 27 (SUTURE) ×4
SUT VIC AB 1 CT1 27XBRD ANBCTR (SUTURE) ×4 IMPLANT
SUT VIC AB 2-0 CT1 27 (SUTURE) ×4
SUT VIC AB 2-0 CT1 TAPERPNT 27 (SUTURE) ×4 IMPLANT
SYR 20CC LL (SYRINGE) ×6 IMPLANT
TOWEL OR 17X24 6PK STRL BLUE (TOWEL DISPOSABLE) ×3 IMPLANT
TOWEL OR 17X26 10 PK STRL BLUE (TOWEL DISPOSABLE) ×3 IMPLANT
WATER STERILE IRR 1000ML POUR (IV SOLUTION) ×6 IMPLANT

## 2014-10-30 NOTE — H&P (Signed)
Lauren HitchSandra D Starks MRN:  [id number] DOB/SEX:  11/05/1941/female  CHIEF COMPLAINT:  Painful left Knee  HISTORY: Patient is a [age] y.o. female presented with a history of pain in the left knee. Onset of symptoms was gradual starting several years ago with gradually worsening course since that time. Prior procedures on the knee include none. Patient has been treated conservatively with over-the-counter NSAIDs and activity modification. Patient currently rates pain in the knee at 10 out of 10 with activity. There is no pain at night.  PAST MEDICAL HISTORY: Patient Active Problem List   Diagnosis Date Noted  . Back pain 08/27/2012  . Right groin pain 01/13/2012   Past Medical History  Diagnosis Date  . Hyperlipidemia     takes Pravastatin daily  . Hypothyroidism     takes Synthroid daily  . Hypertension     takes Azor and Metoprolol daily  . Arthritis   . Joint pain   . History of colon polyps   . Urinary urgency   . Urinary frequency   . History of blood transfusion as a child    no abnormal reaction noted  . Diarrhea     since gallbladder removed  . Graves disease    Past Surgical History  Procedure Laterality Date  . Cholecystectomy  1971  . Stomach stapled  1985  . Joint replacement Right     knee  . Colonoscopy    . Cataract surgery Bilateral      MEDICATIONS:   Prescriptions prior to admission  Medication Sig Dispense Refill Last Dose  . acetaminophen (TYLENOL) 500 MG tablet Take 500 mg by mouth at bedtime. For leg pain   Past Month at Unknown time  . amLODipine-olmesartan (AZOR) 5-20 MG per tablet Take 1 tablet by mouth daily.   10/30/2014 at 0500  . aspirin 325 MG tablet Take 325 mg by mouth daily.   Past Week at Unknown time  . carvedilol (COREG CR) 20 MG 24 hr capsule Take 20 mg by mouth daily.   10/30/2014 at 0500  . Cholecalciferol (VITAMIN D3) 5000 UNITS CAPS Take 5,000 Units by mouth daily.   Past Week at Unknown time  . levothyroxine (SYNTHROID, LEVOTHROID) 200  MCG tablet Take 200 mcg by mouth daily.   10/30/2014 at 0500  . Multiple Vitamin (MULTIVITAMIN) tablet Take 1 tablet by mouth daily.   Past Week at Unknown time  . pravastatin (PRAVACHOL) 40 MG tablet Take 40 mg by mouth daily.   10/30/2014 at 0500    ALLERGIES:  No Known Allergies  REVIEW OF SYSTEMS:  A comprehensive review of systems was negative.   FAMILY HISTORY:  History reviewed. No pertinent family history.  SOCIAL HISTORY:   History  Substance Use Topics  . Smoking status: Never Smoker   . Smokeless tobacco: Never Used  . Alcohol Use: No     EXAMINATION:  Vital signs in last 24 hours: Temp:  [97.6 F (36.4 C)] 97.6 F (36.4 C) (11/09 0604) Pulse Rate:  [36] 36 (11/09 0604) Resp:  [20] 20 (11/09 0604) BP: (174)/(41) 174/41 mmHg (11/09 0604) SpO2:  [99 %] 99 % (11/09 0604) Weight:  [83.462 kg (184 lb)] 83.462 kg (184 lb) (11/09 0604)  General appearance: alert, cooperative and no distress Lungs: clear to auscultation bilaterally Heart: regular rate and rhythm, S1, S2 normal, no murmur, click, rub or gallop Abdomen: soft, non-tender; bowel sounds normal; no masses,  no organomegaly Extremities: extremities normal, atraumatic, no cyanosis or edema and Homans sign  is negative, no sign of DVT Pulses: 2+ and symmetric Skin: Skin color, texture, turgor normal. No rashes or lesions Neurologic: Alert and oriented X 3, normal strength and tone. Normal symmetric reflexes. Normal coordination and gait  Musculoskeletal:  ROM 0-100, Ligaments intact,  Imaging Review Plain radiographs demonstrate severe degenerative joint disease of the left knee. The overall alignment is significant varus. The bone quality appears to be good for age and reported activity level.  Assessment/Plan: Primary osteoarthritis, left knee   The patient history, physical examination and imaging studies are consistent with advanced degenerative joint disease of the left knee. The patient has failed  conservative treatment.  The clearance notes were reviewed.  After discussion with the patient it was felt that Total Knee Replacement was indicated. The procedure,  risks, and benefits of total knee arthroplasty were presented and reviewed. The risks including but not limited to aseptic loosening, infection, blood clots, vascular injury, stiffness, patella tracking problems complications among others were discussed. The patient acknowledged the explanation, agreed to proceed with the plan.  Ngai Parcell 10/30/2014, 6:38 AM

## 2014-10-30 NOTE — ED Notes (Signed)
Pt was sent here from a pre-surgical work-up (d/t have L knee replacement today for ekg changes).   EKG states complete heart block.  Pt states she's been a little dizzy since Wed.

## 2014-10-30 NOTE — Progress Notes (Signed)
Echocardiogram 2D Echocardiogram has been performed.  Lauren Henry 10/30/2014, 1:26 PM

## 2014-10-30 NOTE — Anesthesia Preprocedure Evaluation (Deleted)
Anesthesia Evaluation  Patient identified by MRN, date of birth, ID band Patient awake    Reviewed: Allergy & Precautions, H&P , NPO status , Patient's Chart, lab work & pertinent test results  Airway Mallampati: II   Neck ROM: full    Dental   Pulmonary neg pulmonary ROS,          Cardiovascular hypertension,     Neuro/Psych negative neurological ROS  negative psych ROS   GI/Hepatic negative GI ROS, Neg liver ROS,   Endo/Other  Hypothyroidism Hyperthyroidism obese  Renal/GU negative Renal ROS     Musculoskeletal  (+) Arthritis -,   Abdominal   Peds  Hematology negative hematology ROS (+)   Anesthesia Other Findings   Reproductive/Obstetrics                            Anesthesia Physical Anesthesia Plan  ASA: II  Anesthesia Plan: General and Regional   Post-op Pain Management: MAC Combined w/ Regional for Post-op pain   Induction: Intravenous  Airway Management Planned: LMA  Additional Equipment:   Intra-op Plan:   Post-operative Plan:   Informed Consent: I have reviewed the patients History and Physical, chart, labs and discussed the procedure including the risks, benefits and alternatives for the proposed anesthesia with the patient or authorized representative who has indicated his/her understanding and acceptance.     Plan Discussed with: CRNA, Anesthesiologist and Surgeon  Anesthesia Plan Comments:        Anesthesia Quick Evaluation

## 2014-10-30 NOTE — H&P (Signed)
PATIENT PROFILE: Lauren Henry is a 73 y.o. female who was scheduled to undergo elective left knee replacement surgery today and was found to be in complete heart block.   HPI:  Lauren Henry is a 73 y.o. female who has a long-standing history of hypertension for which he takes Azor and long-acting Coreg daily.  She also has a history of previous Graves' disease status post treatment with ultimate need for Synthroid replacement.  She has a history of hyperlipidemia for which he takes pravastatin.  She denies any episodes of chest pain.  For the past 5 days, since last Wednesday, she has noted episodes of some mild dizziness.  She was scheduled to undergo left knee replacement surgery today.  She presented to the preoperative assessment and was found to have complete heart block with heart rate in the 30s.  Cardiology was contacted and she was referred to the emergency room.  She is now admitted for further evaluation and treatment. The patient took her last dose of Coreg CR 20 mg this morning at 5 AM.  Past Medical History  Diagnosis Date  . Hyperlipidemia     takes Pravastatin daily  . Hypothyroidism     takes Synthroid daily  . Hypertension     takes Azor and Metoprolol daily  . Arthritis   . Joint pain   . History of colon polyps   . Urinary urgency   . Urinary frequency   . History of blood transfusion as a child    no abnormal reaction noted  . Diarrhea     since gallbladder removed  . Graves disease   . Atrial fibrillation     Past Surgical History  Procedure Laterality Date  . Cholecystectomy  1971  . Stomach stapled  1985  . Joint replacement Right     knee  . Colonoscopy    . Cataract surgery Bilateral   . Abdominal hysterectomy      No Known Allergies  No current facility-administered medications for this encounter.   Current Outpatient Prescriptions  Medication Sig Dispense Refill  . acetaminophen (TYLENOL) 500 MG tablet Take 500 mg by mouth at bedtime.  For leg pain    . amLODipine-olmesartan (AZOR) 5-20 MG per tablet Take 1 tablet by mouth daily.    Marland Kitchen. aspirin 325 MG tablet Take 325 mg by mouth daily.    . carvedilol (COREG CR) 20 MG 24 hr capsule Take 20 mg by mouth daily.    . Cholecalciferol (VITAMIN D3) 5000 UNITS CAPS Take 5,000 Units by mouth daily.    Marland Kitchen. levothyroxine (SYNTHROID, LEVOTHROID) 200 MCG tablet Take 200 mcg by mouth daily.    . Multiple Vitamin (MULTIVITAMIN) tablet Take 1 tablet by mouth daily.    . pravastatin (PRAVACHOL) 40 MG tablet Take 40 mg by mouth daily.      Socially she is married for 50 years.  She has 2 children and 5 grandchildren.  There is no tobacco history.  Both parents are deceased.   ROS General: Negative; No fevers, chills, or night sweats HEENT: Negative; No changes in vision or hearing, sinus congestion, difficulty swallowing Pulmonary: Negative; No cough, wheezing, shortness of breath, hemoptysis Cardiovascular:  See HPI; No chest pain, presyncope, syncope, palpitations, edema GI: Negative; No nausea, vomiting, diarrhea, or abdominal pain GU: Negative; No dysuria, hematuria, or difficulty voiding Musculoskeletal: positive for left knee pain and need for left knee repeat total replacement;  Hematologic/Oncologic: Negative; no easy bruising, bleeding Endocrine:  positive for treatment of Graves' disease, now on chronic Synthroid replacement Neuro: Negative; no changes in balance, headaches Skin: Negative; No rashes or skin lesions Psychiatric: Negative; No behavioral problems, depression Sleep: Negative; No daytime sleepiness, hypersomnolence, bruxism, restless legs, hypnogagnic hallucinations Other comprehensive 14 point system review is negative   Physical Exam BP 128/45 mmHg  Pulse 33  Temp(Src) 97.9 F (36.6 C) (Oral)  Resp 15  Ht 5\' 5"  (1.651 m)  Wt 183 lb (83.008 kg)  BMI 30.45 kg/m2  SpO2 95% General: Alert, oriented, no distress.  Skin: normal turgor, no rashes, warm and  dry HEENT: Normocephalic, atraumatic. Pupils equal round and reactive to light; sclera anicteric; extraocular muscles intact;  Nose without nasal septal hypertrophy Mouth/Parynx benign; Mallinpatti scale 3 Neck: No JVD, no carotid bruits; normal carotid upstroke Lungs: clear to ausculatation and percussion; no wheezing or rales Chest wall: without tenderness to palpitation Heart: PMI not displaced, RRR, s1 s2 normal, 1/6 systolic murmur, no diastolic murmur, no rubs, gallops, thrills, or heaves Abdomen: soft, nontender; no hepatosplenomehaly, BS+; abdominal aorta nontender and not dilated by palpation. Back: no CVA tenderness Pulses 2+ Musculoskeletal: full range of motion, normal strength, no joint deformities Extremities: no clubbing cyanosis or edema, Homan's sign negative  Neurologic: grossly nonfocal; Cranial nerves grossly wnl Psychologic: Normal mood and affect   ECG (independently read by me): AV dissociation with atrial rate at approximate 75 and ventricular rate at a proximally 34 bpm  LABS:  BMET    Component Value Date/Time   NA 142 10/30/2014 0745   K 4.3 10/30/2014 0745   CL 104 10/30/2014 0745   CO2 20 10/30/2014 0745   GLUCOSE 119* 10/30/2014 0745   BUN 32* 10/30/2014 0745   CREATININE 1.15* 10/30/2014 0745   CALCIUM 9.9 10/30/2014 0745   GFRNONAA 46* 10/30/2014 0745   GFRAA 53* 10/30/2014 0745     Hepatic Function Panel     Component Value Date/Time   PROT 7.7 10/19/2014 1429   ALBUMIN 4.5 10/19/2014 1429   AST 16 10/19/2014 1429   ALT 17 10/19/2014 1429   ALKPHOS 72 10/19/2014 1429   BILITOT 0.4 10/19/2014 1429     CBC    Component Value Date/Time   WBC 6.7 10/30/2014 0745   RBC 4.17 10/30/2014 0745   HGB 13.0 10/30/2014 0745   HCT 38.3 10/30/2014 0745   PLT 188 10/30/2014 0745   MCV 91.8 10/30/2014 0745   MCH 31.2 10/30/2014 0745   MCHC 33.9 10/30/2014 0745   RDW 13.9 10/30/2014 0745   LYMPHSABS 1.4 10/30/2014 0745   MONOABS 0.4  10/30/2014 0745   EOSABS 0.1 10/30/2014 0745   BASOSABS 0.0 10/30/2014 0745     BNP No results found for: PROBNP  Lipid Panel  No results found for: CHOL, TRIG, HDL, CHOLHDL, VLDL, LDLCALC, LDLDIRECT    RADIOLOGY: Dg Chest 2 View  10/19/2014   CLINICAL DATA:  Preoperative total knee replacement.  Hypertension  EXAM: CHEST  2 VIEW  COMPARISON:  February 24, 2011  FINDINGS: There is minimal apical scarring bilaterally. There is no edema or consolidation. Heart size and pulmonary vascularity are normal. No adenopathy. There is mild degenerative change in the thoracic spine.  IMPRESSION: No edema or consolidation.   Electronically Signed   By: Bretta BangWilliam  Woodruff M.D.   On: 10/19/2014 16:53     ASSESSMENT AND PLAN: 1. Complete heart block with AV dissociation with atrial rate at approximate 75 bpm and ventricular rate at 34 bpm with  left bundle branch block escape rhythm 2.  History of hypertension for which she has been on Azor (amlodipine/Benicar combination), Coreg CR 20 mg 3. History of hyperlipidemia for which she has been on pravastatin 4.  History of Graves' disease, status post treatment and on supplemental Synthroid replacement therapy for hypothyroidism 5.  Degenerative joint disease of the left knee in need for left total knee replacement  Ms. Kinter is currently NPO since she was in anticipation of left knee replacement surgery today.  She presents with a five-day history of dizziness, which most likely is due to development of a heart block.  A 2-D echo study will be obtained.  She took her dose of long-acting Coreg CR 20 mg this morning.  She will be admitted to the coronary care unit for close observation with pacer plats in place. She may require for a temporary pacemaker insertion and potential permanent pacemaker if her heart block does not resolve with discontinuance of beta blocker therapy.   Lennette Bihari, MD, [facility] 10/30/2014 9:29 AM

## 2014-10-30 NOTE — ED Provider Notes (Signed)
CSN: [id number]     Arrival date & time 10/30/14  0730 History   First MD Initiated Contact with Patient 10/30/14 0740     Chief Complaint  Patient presents with  . Dizziness     (Consider location/radiation/quality/duration/timing/severity/associated sxs/prior Treatment) HPI Comments: This is a 73 year old female with a past medical history of hypothyroidism, hyperlipidemia and hypertension who presents to the emergency department from short stay with EKG changes. Patient was getting a presurgical workup for a left knee replacement that she was supposed to have today, and during her evaluation she was noted to have heart block. She was then sent down to the emergency department. Patient reports over the past 5 days she has felt slightly dizzy, had a mucous sounding cough and felt like she had a viral respiratory infection. Yesterday she was feeling fine. When she woke up this morning, she reports she was feeling hungry and slightly dizzy. She reports she's been experiencing increased fatigue and has been slightly nauseated. No vomiting. Denies chest pain, shortness of breath, diaphoresis, numbness or tingling. Denies ever having cardiac issues or need to see a cardiologist.  The history is provided by the patient and the spouse.    Past Medical History  Diagnosis Date  . Hyperlipidemia     takes Pravastatin daily  . Hypothyroidism     takes Synthroid daily  . Hypertension     takes Azor and Metoprolol daily  . Arthritis   . Joint pain   . History of colon polyps   . Urinary urgency   . Urinary frequency   . History of blood transfusion as a child    no abnormal reaction noted  . Diarrhea     since gallbladder removed  . Graves disease   . Atrial fibrillation    Past Surgical History  Procedure Laterality Date  . Cholecystectomy  1971  . Stomach stapled  1985  . Joint replacement Right     knee  . Colonoscopy    . Cataract surgery Bilateral   . Abdominal hysterectomy      No family history on file. History  Substance Use Topics  . Smoking status: Never Smoker   . Smokeless tobacco: Never Used  . Alcohol Use: No   OB History    No data available     Review of Systems  10 Systems reviewed and are negative for acute change except as noted in the HPI.  Allergies  Review of patient's allergies indicates no known allergies.  Home Medications   Prior to Admission medications   Medication Sig Start Date End Date Taking? Authorizing Provider  acetaminophen (TYLENOL) 500 MG tablet Take 500 mg by mouth at bedtime. For leg pain   Yes Historical Provider, MD  amLODipine-olmesartan (AZOR) 5-20 MG per tablet Take 1 tablet by mouth daily.   Yes Historical Provider, MD  aspirin 325 MG tablet Take 325 mg by mouth daily.   Yes Historical Provider, MD  carvedilol (COREG CR) 20 MG 24 hr capsule Take 20 mg by mouth daily.   Yes Historical Provider, MD  Cholecalciferol (VITAMIN D3) 5000 UNITS CAPS Take 5,000 Units by mouth daily.   Yes Historical Provider, MD  levothyroxine (SYNTHROID, LEVOTHROID) 200 MCG tablet Take 200-300 mcg by mouth daily. Tuesday, Thursday, Sunday  300 mcg.   Yes Historical Provider, MD  Multiple Vitamin (MULTIVITAMIN) tablet Take 1 tablet by mouth daily.   Yes Historical Provider, MD  pravastatin (PRAVACHOL) 40 MG tablet Take 40 mg by mouth  daily.   Yes Historical Provider, MD   BP 129/48 mmHg  Pulse 62  Temp(Src) 98.4 F (36.9 C) (Oral)  Resp 14  Ht 5\' 5"  (1.651 m)  Wt 183 lb (83.008 kg)  BMI 30.45 kg/m2  SpO2 95% Physical Exam  Constitutional: She is oriented to person, place, and time. She appears well-developed and well-nourished. No distress.  HENT:  Head: Normocephalic and atraumatic.  Mouth/Throat: Oropharynx is clear and moist.  Eyes: Conjunctivae and EOM are normal. Pupils are equal, round, and reactive to light.  Neck: Normal range of motion. Neck supple. No JVD present.  Cardiovascular: Normal heart sounds and intact  distal pulses.  An irregular rhythm present. Bradycardia present.   No extremity edema.  Pulmonary/Chest: Effort normal and breath sounds normal. No respiratory distress.  Abdominal: Soft. Bowel sounds are normal. There is no tenderness.  Musculoskeletal: Normal range of motion. She exhibits no edema.  Neurological: She is alert and oriented to person, place, and time. She has normal strength. No sensory deficit.  Speech fluent, goal oriented. Moves limbs without ataxia. Equal grip strength bilateral.  Skin: Skin is warm and dry. She is not diaphoretic.  Psychiatric: She has a normal mood and affect. Her behavior is normal.  Nursing note and vitals reviewed.   ED Course  Procedures (including critical care time) Labs Review Labs Reviewed  BASIC METABOLIC PANEL - Abnormal; Notable for the following:    Glucose, Bld 119 (*)    BUN 32 (*)    Creatinine, Ser 1.15 (*)    GFR calc non Af Amer 46 (*)    GFR calc Af Amer 53 (*)    Anion gap 18 (*)    All other components within normal limits  COMPREHENSIVE METABOLIC PANEL - Abnormal; Notable for the following:    Glucose, Bld 109 (*)    BUN 31 (*)    Creatinine, Ser 1.14 (*)    GFR calc non Af Amer 47 (*)    GFR calc Af Amer 54 (*)    Anion gap 16 (*)    All other components within normal limits  T4, FREE - Abnormal; Notable for the following:    Free T4 2.41 (*)    All other components within normal limits  PRO B NATRIURETIC PEPTIDE - Abnormal; Notable for the following:    Pro B Natriuretic peptide (BNP) 3052.0 (*)    All other components within normal limits  BASIC METABOLIC PANEL - Abnormal; Notable for the following:    BUN 26 (*)    Creatinine, Ser 1.17 (*)    GFR calc non Af Amer 45 (*)    GFR calc Af Amer 52 (*)    All other components within normal limits  CBC - Abnormal; Notable for the following:    Hemoglobin 11.7 (*)    HCT 35.3 (*)    All other components within normal limits  CBC WITH DIFFERENTIAL  TROPONIN I   MAGNESIUM  TSH  TROPONIN I  TROPONIN I  TROPONIN I  PROTIME-INR  APTT  LIPID PANEL    Imaging Review Dg Chest Portable 1 View  10/30/2014   CLINICAL DATA:  Shortness of breath, bradycardia, 3rd degree AV block discovered during preoperative workup for knee surgery, personal history of hypertension, atrial fibrillation  EXAM: PORTABLE CHEST - 1 VIEW  COMPARISON:  Portable exam at 0756 hrs compared to10/29/2015  FINDINGS: Mild enlargement of cardiac silhouette.  Mediastinal contours and pulmonary vascularity normal.  Lungs clear.  No  pleural effusion or pneumothorax.  External pacing lead noted.  Scattered endplate spur formation thoracic spine.  IMPRESSION: Mild enlargement of cardiac silhouette.  No acute abnormalities.   Electronically Signed   By: Ulyses SouthwardMark  Boles M.D.   On: 10/30/2014 09:37     EKG Interpretation None      MDM   Final diagnoses:  Third degree AV block   Patient presenting from short stay with new onset third-degree AV block. She is in no apparent distress. Bradycardic, vitals otherwise stable. Cardiology immediately consulted who will admit patient.  Discussed with attending Dr. Juleen ChinaKohut who also evaluated patient and agrees with plan of care.   Kathrynn SpeedRobyn M Kyrillos Adams, PA-C 10/31/14 2155  Raeford RazorStephen Kohut, MD 11/01/14 [phone]

## 2014-10-30 NOTE — ED Provider Notes (Signed)
Medical screening examination/treatment/procedure(s) were conducted as a shared visit with non-physician practitioner(s) and myself.  I personally evaluated the patient during the encounter.   EKG Interpretation None     73yF with complete HB. Atrial rate ~75, ventricular rate ~35.  Noted in pre-op eval for knee replacement earlier today and referred to ED. For past several days she has been feeling "lousy." Dizziness and nausea. Worse with exertion. Feels better at rest.   Denies CP.  Does not appear to have diagnosed history of CAD.  Is on carvedilol and amlodipine for hx of HTN. No recent medication changes. No recent known tick exposure. Does not have a cardiologist.  Currently no symptoms while laying in bed. Mentation fine. BP adequate. Needs cardiology evaluation and admission for further eval and PM determination.   Raeford RazorStephen Haislee Corso, MD 10/30/14 [phone]

## 2014-10-30 NOTE — Progress Notes (Addendum)
[phone] notified Dr. Randa EvensEdwards of pt's blood pressure and heart rate. Pt. Denies pain,chest pain or shortness of breath. States she queasy the couple of days. [phone] notified Dr. Sherlean FootLucey.  No new orders.  Dr. Sampson GoonFitzgerald in to  See pt. 12 lead ekg obtained. Stated to cancel surgery due to ekg changes. 0720 pt. Transferred to ED via wheelchair and monitor for transport in place.

## 2014-10-31 LAB — BASIC METABOLIC PANEL
Anion gap: 13 (ref 5–15)
BUN: 26 mg/dL — ABNORMAL HIGH (ref 6–23)
CALCIUM: 9.1 mg/dL (ref 8.4–10.5)
CO2: 24 meq/L (ref 19–32)
Chloride: 108 mEq/L (ref 96–112)
Creatinine, Ser: 1.17 mg/dL — ABNORMAL HIGH (ref 0.50–1.10)
GFR calc Af Amer: 52 mL/min — ABNORMAL LOW (ref >90)
GFR calc non Af Amer: 45 mL/min — ABNORMAL LOW (ref >90)
Glucose, Bld: 95 mg/dL (ref 70–99)
Potassium: 4.3 mEq/L (ref 3.7–5.3)
SODIUM: 145 meq/L (ref 137–147)

## 2014-10-31 LAB — LIPID PANEL
Cholesterol: 126 mg/dL (ref 0–200)
HDL: 49 mg/dL (ref >39)
LDL Cholesterol: 60 mg/dL (ref 0–99)
Total CHOL/HDL Ratio: 2.6 RATIO
Triglycerides: 83 mg/dL (ref <150)
VLDL: 17 mg/dL (ref 0–40)

## 2014-10-31 LAB — CBC
HCT: 35.3 % — ABNORMAL LOW (ref 36.0–46.0)
Hemoglobin: 11.7 g/dL — ABNORMAL LOW (ref 12.0–15.0)
MCH: 30.1 pg (ref 26.0–34.0)
MCHC: 33.1 g/dL (ref 30.0–36.0)
MCV: 90.7 fL (ref 78.0–100.0)
PLATELETS: 166 10*3/uL (ref 150–400)
RBC: 3.89 MIL/uL (ref 3.87–5.11)
RDW: 14 % (ref 11.5–15.5)
WBC: 5.9 10*3/uL (ref 4.0–10.5)

## 2014-10-31 NOTE — Progress Notes (Signed)
Patient ID: Lauren HitchSandra D Pembroke, female   DOB: 12/06/1941, 73 y.o.   MRN: [id number]    Patient Name: Lauren HitchSandra D Henry Date of Encounter: 10/31/2014     Active Problems:   Complete heart block by electrocardiogram   Hypertension   Hyperlipidemia   Hypothyroidism   CHB (complete heart block)    SUBJECTIVE  No complaint. Remains with 2:1 AV block  CURRENT MEDS . aspirin EC  81 mg Oral Daily  . atorvastatin  20 mg Oral q1800    OBJECTIVE  Filed Vitals:   10/31/14 1105 10/31/14 1400 10/31/14 1500 10/31/14 1600  BP: 98/71 143/39 113/44 126/45  Pulse:      Temp: 98.5 F (36.9 C)   98.2 F (36.8 C)  TempSrc: Oral   Oral  Resp: 13 15 12 17   Height:      Weight:      SpO2: 95% 99% 99% 100%    Intake/Output Summary (Last 24 hours) at 10/31/14 1658 Last data filed at 10/31/14 1600  Gross per 24 hour  Intake   2020 ml  Output    900 ml  Net   1120 ml   Filed Weights   10/30/14 0743  Weight: 183 lb (83.008 kg)    PHYSICAL EXAM  General: Pleasant, NAD. Neuro: Alert and oriented X 3. Moves all extremities spontaneously. Psych: Normal affect. HEENT:  Normal  Neck: Supple without bruits or JVD. Lungs:  Resp regular and unlabored, CTA. Heart: Reg bradycardia, no s3, s4, or murmurs. Abdomen: Soft, non-tender, non-distended, BS + x 4.  Extremities: No clubbing, cyanosis or edema. DP/PT/Radials 2+ and equal bilaterally.  Accessory Clinical Findings  CBC  Recent Labs  10/30/14 0745 10/31/14 0214  WBC 6.7 5.9  NEUTROABS 4.7  --   HGB 13.0 11.7*  HCT 38.3 35.3*  MCV 91.8 90.7  PLT 188 166   Basic Metabolic Panel  Recent Labs  10/30/14 0745 10/30/14 1008 10/31/14 0214  NA 142 142 145  K 4.3 4.2 4.3  CL 104 104 108  CO2 20 22 24   GLUCOSE 119* 109* 95  BUN 32* 31* 26*  CREATININE 1.15* 1.14* 1.17*  CALCIUM 9.9 9.6 9.1  MG 1.8  --   --    Liver Function Tests  Recent Labs  10/30/14 1008  AST 19  ALT 31  ALKPHOS 60  BILITOT 0.6  PROT 6.9  ALBUMIN  4.1   No results for input(s): LIPASE, AMYLASE in the last 72 hours. Cardiac Enzymes  Recent Labs  10/30/14 1008 10/30/14 1500 10/30/14 2139  TROPONINI <0.30 <0.30 <0.30   BNP Invalid input(s): POCBNP D-Dimer No results for input(s): DDIMER in the last 72 hours. Hemoglobin A1C No results for input(s): HGBA1C in the last 72 hours. Fasting Lipid Panel  Recent Labs  10/31/14 0214  CHOL 126  HDL 49  LDLCALC 60  TRIG 83  CHOLHDL 2.6   Thyroid Function Tests  Recent Labs  10/30/14 0755  TSH 3.390    TELE  nsr with 2:1 Av block  ECG  NSR with 2:1 AV block  Radiology/Studies  Dg Chest 2 View  10/19/2014   CLINICAL DATA:  Preoperative total knee replacement.  Hypertension  EXAM: CHEST  2 VIEW  COMPARISON:  February 24, 2011  FINDINGS: There is minimal apical scarring bilaterally. There is no edema or consolidation. Heart size and pulmonary vascularity are normal. No adenopathy. There is mild degenerative change in the thoracic spine.  IMPRESSION: No edema or consolidation.  Electronically Signed   By: Bretta BangWilliam  Woodruff M.D.   On: 10/19/2014 16:53   Dg Chest Portable 1 View  10/30/2014   CLINICAL DATA:  Shortness of breath, bradycardia, 3rd degree AV block discovered during preoperative workup for knee surgery, personal history of hypertension, atrial fibrillation  EXAM: PORTABLE CHEST - 1 VIEW  COMPARISON:  Portable exam at 0756 hrs compared to10/29/2015  FINDINGS: Mild enlargement of cardiac silhouette.  Mediastinal contours and pulmonary vascularity normal.  Lungs clear.  No pleural effusion or pneumothorax.  External pacing lead noted.  Scattered endplate spur formation thoracic spine.  IMPRESSION: Mild enlargement of cardiac silhouette.  No acute abnormalities.   Electronically Signed   By: Ulyses SouthwardMark  Boles M.D.   On: 10/30/2014 09:37    ASSESSMENT AND PLAN  1.symptomatic CHB, now 2:1 AV block 36 hours after Coreg CR stopped. Will watch overnight. Will review half life of  coreg cr, I suspect she has had 3 half lives at this point. If AV conduction improves will discharge home and if not, will plan PPM as the patient previously has had a h/o conduction system disease.  Gregg Taylor,M.D.  10/31/2014 4:58 PM

## 2014-11-01 ENCOUNTER — Encounter (HOSPITAL_COMMUNITY)
Admission: EM | Disposition: A | Discharge status: Home / Self Care | Payer: Self-pay | Source: Home / Self Care | Attending: Cardiovascular Disease

## 2014-11-01 DIAGNOSIS — I442 Atrioventricular block, complete: Secondary | ICD-10-CM

## 2014-11-01 HISTORY — PX: PERMANENT PACEMAKER INSERTION: SHX5480

## 2014-11-01 LAB — CBC
HEMATOCRIT: 37.5 % (ref 36.0–46.0)
HEMOGLOBIN: 12.3 g/dL (ref 12.0–15.0)
MCH: 30.8 pg (ref 26.0–34.0)
MCHC: 32.8 g/dL (ref 30.0–36.0)
MCV: 94 fL (ref 78.0–100.0)
Platelets: 161 10*3/uL (ref 150–400)
RBC: 3.99 MIL/uL (ref 3.87–5.11)
RDW: 14 % (ref 11.5–15.5)
WBC: 7.2 10*3/uL (ref 4.0–10.5)

## 2014-11-01 LAB — APTT: aPTT: 31 seconds (ref 24–37)

## 2014-11-01 LAB — PROTIME-INR
INR: 1.12 (ref 0.00–1.49)
Prothrombin Time: 14.5 seconds (ref 11.6–15.2)

## 2014-11-01 SURGERY — PERMANENT PACEMAKER INSERTION
Anesthesia: LOCAL

## 2014-11-01 MED ORDER — LEVOTHYROXINE SODIUM 200 MCG PO TABS
200.0000 ug | ORAL_TABLET | Freq: Every day | ORAL | Status: DC
  Administered 2014-11-01: 200 ug via ORAL
  Filled 2014-11-01: qty 1.5

## 2014-11-01 MED ORDER — ACETAMINOPHEN 500 MG PO TABS
500.0000 mg | ORAL_TABLET | Freq: Every day | ORAL | Status: DC
  Administered 2014-11-01: 500 mg via ORAL
  Filled 2014-11-01 (×2): qty 1

## 2014-11-01 MED ORDER — ASPIRIN 325 MG PO TABS
325.0000 mg | ORAL_TABLET | Freq: Every day | ORAL | Status: DC
  Administered 2014-11-02: 325 mg via ORAL
  Filled 2014-11-01 (×2): qty 1

## 2014-11-01 MED ORDER — CEFAZOLIN SODIUM-DEXTROSE 2-3 GM-% IV SOLR
2.0000 g | INTRAVENOUS | Status: DC
  Filled 2014-11-01: qty 50

## 2014-11-01 MED ORDER — DEXTROSE 5 % IV SOLN
1.5000 g | INTRAVENOUS | Status: DC
  Filled 2014-11-01: qty 1.5

## 2014-11-01 MED ORDER — LIDOCAINE HCL (PF) 1 % IJ SOLN
INTRAMUSCULAR | Status: AC
  Filled 2014-11-01: qty 60

## 2014-11-01 MED ORDER — ONDANSETRON HCL 4 MG/2ML IJ SOLN: 4.0000 mg | Freq: Four times a day (QID) | INTRAMUSCULAR | Status: DC | PRN

## 2014-11-01 MED ORDER — CEFAZOLIN SODIUM 1-5 GM-% IV SOLN
1.0000 g | Freq: Four times a day (QID) | INTRAVENOUS | Status: AC
  Administered 2014-11-01 – 2014-11-02 (×3): 1 g via INTRAVENOUS
  Filled 2014-11-01 (×3): qty 50

## 2014-11-01 MED ORDER — ACETAMINOPHEN 325 MG PO TABS: 325.0000 mg | ORAL_TABLET | ORAL | Status: DC | PRN

## 2014-11-01 MED ORDER — IRBESARTAN 150 MG PO TABS
150.0000 mg | ORAL_TABLET | Freq: Every day | ORAL | Status: DC
  Administered 2014-11-01 – 2014-11-02 (×2): 150 mg via ORAL
  Filled 2014-11-01 (×3): qty 1

## 2014-11-01 MED ORDER — FENTANYL CITRATE 0.05 MG/ML IJ SOLN
INTRAMUSCULAR | Status: AC
  Filled 2014-11-01: qty 2

## 2014-11-01 MED ORDER — SODIUM CHLORIDE 0.9 % IV SOLN: INTRAVENOUS | Status: DC

## 2014-11-01 MED ORDER — CHLORHEXIDINE GLUCONATE 4 % EX LIQD
Freq: Once | CUTANEOUS | Status: AC
  Filled 2014-11-01: qty 60

## 2014-11-01 MED ORDER — PRAVASTATIN SODIUM 40 MG PO TABS
40.0000 mg | ORAL_TABLET | Freq: Every day | ORAL | Status: DC
  Administered 2014-11-01 – 2014-11-02 (×2): 40 mg via ORAL
  Filled 2014-11-01 (×2): qty 1

## 2014-11-01 MED ORDER — SODIUM CHLORIDE 0.9 % IR SOLN
80.0000 mg | Status: DC
  Filled 2014-11-01: qty 2

## 2014-11-01 MED ORDER — YOU HAVE A PACEMAKER BOOK
Freq: Once | Status: AC
  Administered 2014-11-01: 21:00:00
  Filled 2014-11-01: qty 1

## 2014-11-01 MED ORDER — MIDAZOLAM HCL 5 MG/5ML IJ SOLN
INTRAMUSCULAR | Status: AC
  Filled 2014-11-01: qty 5

## 2014-11-01 MED ORDER — LEVOTHYROXINE SODIUM 200 MCG PO TABS
200.0000 ug | ORAL_TABLET | ORAL | Status: DC
  Filled 2014-11-01: qty 1

## 2014-11-01 MED ORDER — LEVOTHYROXINE SODIUM 150 MCG PO TABS
300.0000 ug | ORAL_TABLET | ORAL | Status: DC
  Filled 2014-11-01: qty 2

## 2014-11-01 MED ORDER — CARVEDILOL PHOSPHATE ER 20 MG PO CP24
20.0000 mg | ORAL_CAPSULE | Freq: Every day | ORAL | Status: DC
  Administered 2014-11-01 – 2014-11-02 (×2): 20 mg via ORAL
  Filled 2014-11-01 (×2): qty 1

## 2014-11-01 MED ORDER — CHLORHEXIDINE GLUCONATE 4 % EX LIQD
60.0000 mL | Freq: Once | CUTANEOUS | Status: AC
  Administered 2014-11-01: 4 via TOPICAL

## 2014-11-01 MED ORDER — HEPARIN (PORCINE) IN NACL 2-0.9 UNIT/ML-% IJ SOLN
INTRAMUSCULAR | Status: AC
  Filled 2014-11-01: qty 500

## 2014-11-01 MED ORDER — AMLODIPINE-OLMESARTAN 5-20 MG PO TABS: 1.0000 | ORAL_TABLET | Freq: Every day | ORAL | Status: DC

## 2014-11-01 MED ORDER — AMLODIPINE BESYLATE 5 MG PO TABS
5.0000 mg | ORAL_TABLET | Freq: Every day | ORAL | Status: DC
  Administered 2014-11-01 – 2014-11-02 (×2): 5 mg via ORAL
  Filled 2014-11-01 (×3): qty 1

## 2014-11-01 NOTE — Plan of Care (Signed)
Problem: Phase III Progression Outcomes Goal: Hemodynamically stable Outcome: Completed/Met Date Met:  11/01/14

## 2014-11-01 NOTE — Progress Notes (Signed)
EKG CRITICAL VALUE     12 lead EKG performed.  Critical value noted.  Hillery AldoShanna Stowe, RN notified.   Ernestina PennaROBERTS, Suzy Kugel M, CCT 11/01/2014 7:08 AM

## 2014-11-01 NOTE — Plan of Care (Signed)
Problem: Phase III Progression Outcomes Goal: Other Phase III Outcomes/Goals Outcome: Not Applicable Date Met:  11/01/14     

## 2014-11-01 NOTE — H&P (View-Only) (Signed)
Patient ID: Lauren HitchSandra D Henry, female   DOB: 03/18/1941, 73 y.o.   MRN: [id number]    Patient Name: Lauren Henry Date of Encounter: 11/01/2014     Active Problems:   Complete heart block by electrocardiogram   Hypertension   Hyperlipidemia   Hypothyroidism   CHB (complete heart block)    SUBJECTIVE  No chest pain, sob, or palpitations  CURRENT MEDS . aspirin EC  81 mg Oral Daily  . atorvastatin  20 mg Oral q1800    OBJECTIVE  Filed Vitals:   11/01/14 0400 11/01/14 0500 11/01/14 0600 11/01/14 0700  BP: 111/37 113/40 134/33 160/53  Pulse:      Temp: 98.5 F (36.9 C)   98.1 F (36.7 C)  TempSrc: Oral   Oral  Resp: 17 18 16 13   Height:      Weight:  183 lb 6.8 oz (83.2 kg)    SpO2: 98% 98% 98% 98%    Intake/Output Summary (Last 24 hours) at 11/01/14 0734 Last data filed at 11/01/14 0600  Gross per 24 hour  Intake   2470 ml  Output   1100 ml  Net   1370 ml   Filed Weights   10/30/14 0743 11/01/14 0500  Weight: 183 lb (83.008 kg) 183 lb 6.8 oz (83.2 kg)    PHYSICAL EXAM  General: Pleasant, NAD. Neuro: Alert and oriented X 3. Moves all extremities spontaneously. Psych: Normal affect. HEENT:  Normal  Neck: Supple without bruits or JVD. Lungs:  Resp regular and unlabored, CTA. Heart: Reg bradycardia, no s3, s4, or murmurs. Abdomen: Soft, non-tender, non-distended, BS + x 4.  Extremities: No clubbing, cyanosis or edema. DP/PT/Radials 2+ and equal bilaterally.  Accessory Clinical Findings  CBC  Recent Labs  10/30/14 0745 10/31/14 0214  WBC 6.7 5.9  NEUTROABS 4.7  --   HGB 13.0 11.7*  HCT 38.3 35.3*  MCV 91.8 90.7  PLT 188 166   Basic Metabolic Panel  Recent Labs  10/30/14 0745 10/30/14 1008 10/31/14 0214  NA 142 142 145  K 4.3 4.2 4.3  CL 104 104 108  CO2 20 22 24   GLUCOSE 119* 109* 95  BUN 32* 31* 26*  CREATININE 1.15* 1.14* 1.17*  CALCIUM 9.9 9.6 9.1  MG 1.8  --   --    Liver Function Tests  Recent Labs  10/30/14 1008  AST 19    ALT 31  ALKPHOS 60  BILITOT 0.6  PROT 6.9  ALBUMIN 4.1   No results for input(s): LIPASE, AMYLASE in the last 72 hours. Cardiac Enzymes  Recent Labs  10/30/14 1008 10/30/14 1500 10/30/14 2139  TROPONINI <0.30 <0.30 <0.30   BNP Invalid input(s): POCBNP D-Dimer No results for input(s): DDIMER in the last 72 hours. Hemoglobin A1C No results for input(s): HGBA1C in the last 72 hours. Fasting Lipid Panel  Recent Labs  10/31/14 0214  CHOL 126  HDL 49  LDLCALC 60  TRIG 83  CHOLHDL 2.6   Thyroid Function Tests  Recent Labs  10/30/14 0755  TSH 3.390    TELE  nsr with complete heart block and occaisional PVC's.  Radiology/Studies  Dg Chest 2 View  10/19/2014   CLINICAL DATA:  Preoperative total knee replacement.  Hypertension  EXAM: CHEST  2 VIEW  COMPARISON:  February 24, 2011  FINDINGS: There is minimal apical scarring bilaterally. There is no edema or consolidation. Heart size and pulmonary vascularity are normal. No adenopathy. There is mild degenerative change in the thoracic spine.  IMPRESSION: No edema or consolidation.   Electronically Signed   By: Bretta BangWilliam  Woodruff M.D.   On: 10/19/2014 16:53   Dg Chest Portable 1 View  10/30/2014   CLINICAL DATA:  Shortness of breath, bradycardia, 3rd degree AV block discovered during preoperative workup for knee surgery, personal history of hypertension, atrial fibrillation  EXAM: PORTABLE CHEST - 1 VIEW  COMPARISON:  Portable exam at 0756 hrs compared to10/29/2015  FINDINGS: Mild enlargement of cardiac silhouette.  Mediastinal contours and pulmonary vascularity normal.  Lungs clear.  No pleural effusion or pneumothorax.  External pacing lead noted.  Scattered endplate spur formation thoracic spine.  IMPRESSION: Mild enlargement of cardiac silhouette.  No acute abnormalities.   Electronically Signed   By: Ulyses SouthwardMark  Boles M.D.   On: 10/30/2014 09:37    ASSESSMENT AND PLAN  1. Complete heart block Initially we thought this was due  to Coreg Cr. She has been over 50 hours since taking her last dose and her complete heart block persists. Will plan PPM later today, checking conduction one last time after lunch. I have discussed the risks/benefits/goals/expectations of PPM insertion with the patient and she wishes to proceed.  2. HTN - she will require another drug, or after PPM restart coreg CR.  Lauren Henry,M.D.  11/01/2014 7:34 AM

## 2014-11-01 NOTE — CV Procedure (Signed)
SURGEON:  Lewayne BuntingGregg Georgianne Gritz, MD     PREPROCEDURE DIAGNOSIS:  Symptomatic Bradycardia due to complete heart block    POSTPROCEDURE DIAGNOSIS:  Same as preprocedure diagnosis     PROCEDURES:   1. Pacemaker implantation.     INTRODUCTION: Lauren Henry is a 73 y.o. female  with a history of bradycardia who presents today for pacemaker implantation.  The patient reports intermittent episodes of dizziness over the past few months.  No reversible causes have been identified.  The patient therefore presents today for pacemaker implantation.     DESCRIPTION OF PROCEDURE:  Informed written consent was obtained, and   the patient was brought to the electrophysiology lab in a fasting state.  The patient required no sedation for the procedure today.  The patients left chest was prepped and draped in the usual sterile fashion by the EP lab staff. The skin overlying the left deltopectoral region was infiltrated with lidocaine for local analgesia.  A 4-cm incision was made over the left deltopectoral region.  A left subcutaneous pacemaker pocket was fashioned using a combination of sharp and blunt dissection. Electrocautery was required to assure hemostasis.     RA/RV Lead Placement: The left subclavian vein was therefore directly visualized and cannulated.  Through the left subclavian vein, a Medtronic 5076 (serial number [id number]) right atrial lead and a Medtronic 5076 (serial number [id number] right ventricular lead were advanced with fluoroscopic visualization into the right atrial appendage and right ventricular apical septal positions respectively.  Initial atrial lead P- waves measured 3.2 mV with impedance of 635 ohms and a threshold of 0.7 V at 0.5 msec.  Right ventricular lead R-waves measured 9 mV with an impedance of 744 ohms and a threshold of 1 V at 0.5 msec.  Both leads were secured to the pectoralis fascia using #2-0 silk over the suture sleeves.   Device Placement:  The leads were then connected  to a Medtronic (serial number [id number]) pacemaker.  The pocket was irrigated with copious gentamicin solution.  The pacemaker was then placed into the pocket.  The pocket was then closed in 2 layers with 2.0 Vicryl suture for the subcutaneous and subcuticular layers.  Steri-Strips and a sterile dressing were then applied.  There were no early apparent complications.     CONCLUSIONS:   1. Successful implantation of a Medtronic dual-chamber pacemaker for symptomatic bradycardia due to complete heart block  2. No early apparent complications.           Lewayne BuntingGregg Pattijo Juste, MD 11/01/2014 4:29 PM

## 2014-11-01 NOTE — Plan of Care (Signed)
Problem: Phase III Progression Outcomes Goal: Pain controlled on oral analgesia Outcome: Completed/Met Date Met:  11/01/14 Goal: Tolerating diet Outcome: Completed/Met Date Met:  11/01/14

## 2014-11-01 NOTE — Interval H&P Note (Signed)
History and Physical Interval Note: Since I saw her this morning, her AV conduction is absent and she will proceed with PPM.  11/01/2014 3:21 PM  Lauren Henry  has presented today for surgery, with the diagnosis of bradicardia  The various methods of treatment have been discussed with the patient and family. After consideration of risks, benefits and other options for treatment, the patient has consented to  Procedure(s): PERMANENT PACEMAKER INSERTION (N/A) as a surgical intervention .  The patient's history has been reviewed, patient examined, no change in status, stable for surgery.  I have reviewed the patient's chart and labs.  Questions were answered to the patient's satisfaction.     Leonia ReevesGregg Taylor,M.D.

## 2014-11-01 NOTE — Plan of Care (Signed)
Problem: Phase III Progression Outcomes Goal: Limited arm movement per orders Outcome: Completed/Met Date Met:  11/01/14

## 2014-11-01 NOTE — Progress Notes (Signed)
Patient ID: Lauren Henry, female   DOB: 03/18/1941, 73 y.o.   MRN: [id number]    Patient Name: Lauren Henry Date of Encounter: 11/01/2014     Active Problems:   Complete heart block by electrocardiogram   Hypertension   Hyperlipidemia   Hypothyroidism   CHB (complete heart block)    SUBJECTIVE  No chest pain, sob, or palpitations  CURRENT MEDS . aspirin EC  81 mg Oral Daily  . atorvastatin  20 mg Oral q1800    OBJECTIVE  Filed Vitals:   11/01/14 0400 11/01/14 0500 11/01/14 0600 11/01/14 0700  BP: 111/37 113/40 134/33 160/53  Pulse:      Temp: 98.5 F (36.9 C)   98.1 F (36.7 C)  TempSrc: Oral   Oral  Resp: 17 18 16 13   Height:      Weight:  183 lb 6.8 oz (83.2 kg)    SpO2: 98% 98% 98% 98%    Intake/Output Summary (Last 24 hours) at 11/01/14 0734 Last data filed at 11/01/14 0600  Gross per 24 hour  Intake   2470 ml  Output   1100 ml  Net   1370 ml   Filed Weights   10/30/14 0743 11/01/14 0500  Weight: 183 lb (83.008 kg) 183 lb 6.8 oz (83.2 kg)    PHYSICAL EXAM  General: Pleasant, NAD. Neuro: Alert and oriented X 3. Moves all extremities spontaneously. Psych: Normal affect. HEENT:  Normal  Neck: Supple without bruits or JVD. Lungs:  Resp regular and unlabored, CTA. Heart: Reg bradycardia, no s3, s4, or murmurs. Abdomen: Soft, non-tender, non-distended, BS + x 4.  Extremities: No clubbing, cyanosis or edema. DP/PT/Radials 2+ and equal bilaterally.  Accessory Clinical Findings  CBC  Recent Labs  10/30/14 0745 10/31/14 0214  WBC 6.7 5.9  NEUTROABS 4.7  --   HGB 13.0 11.7*  HCT 38.3 35.3*  MCV 91.8 90.7  PLT 188 166   Basic Metabolic Panel  Recent Labs  10/30/14 0745 10/30/14 1008 10/31/14 0214  NA 142 142 145  K 4.3 4.2 4.3  CL 104 104 108  CO2 20 22 24   GLUCOSE 119* 109* 95  BUN 32* 31* 26*  CREATININE 1.15* 1.14* 1.17*  CALCIUM 9.9 9.6 9.1  MG 1.8  --   --    Liver Function Tests  Recent Labs  10/30/14 1008  AST 19    ALT 31  ALKPHOS 60  BILITOT 0.6  PROT 6.9  ALBUMIN 4.1   No results for input(s): LIPASE, AMYLASE in the last 72 hours. Cardiac Enzymes  Recent Labs  10/30/14 1008 10/30/14 1500 10/30/14 2139  TROPONINI <0.30 <0.30 <0.30   BNP Invalid input(s): POCBNP D-Dimer No results for input(s): DDIMER in the last 72 hours. Hemoglobin A1C No results for input(s): HGBA1C in the last 72 hours. Fasting Lipid Panel  Recent Labs  10/31/14 0214  CHOL 126  HDL 49  LDLCALC 60  TRIG 83  CHOLHDL 2.6   Thyroid Function Tests  Recent Labs  10/30/14 0755  TSH 3.390    TELE  nsr with complete heart block and occaisional PVC's.  Radiology/Studies  Dg Chest 2 View  10/19/2014   CLINICAL DATA:  Preoperative total knee replacement.  Hypertension  EXAM: CHEST  2 VIEW  COMPARISON:  February 24, 2011  FINDINGS: There is minimal apical scarring bilaterally. There is no edema or consolidation. Heart size and pulmonary vascularity are normal. No adenopathy. There is mild degenerative change in the thoracic spine.  IMPRESSION: No edema or consolidation.   Electronically Signed   By: William  Woodruff M.D.   On: 10/19/2014 16:53   Dg Chest Portable 1 View  10/30/2014   CLINICAL DATA:  Shortness of breath, bradycardia, 3rd degree AV block discovered during preoperative workup for knee surgery, personal history of hypertension, atrial fibrillation  EXAM: PORTABLE CHEST - 1 VIEW  COMPARISON:  Portable exam at 0756 hrs compared to10/29/2015  FINDINGS: Mild enlargement of cardiac silhouette.  Mediastinal contours and pulmonary vascularity normal.  Lungs clear.  No pleural effusion or pneumothorax.  External pacing lead noted.  Scattered endplate spur formation thoracic spine.  IMPRESSION: Mild enlargement of cardiac silhouette.  No acute abnormalities.   Electronically Signed   By: Mark  Boles M.D.   On: 10/30/2014 09:37    ASSESSMENT AND PLAN  1. Complete heart block Initially we thought this was due  to Coreg Cr. She has been over 50 hours since taking her last dose and her complete heart block persists. Will plan PPM later today, checking conduction one last time after lunch. I have discussed the risks/benefits/goals/expectations of PPM insertion with the patient and she wishes to proceed.  2. HTN - she will require another drug, or after PPM restart coreg CR.  Gregg Taylor,M.D.  11/01/2014 7:34 AM 

## 2014-11-02 ENCOUNTER — Other Ambulatory Visit: Payer: Self-pay

## 2014-11-02 ENCOUNTER — Inpatient Hospital Stay (HOSPITAL_COMMUNITY): Payer: Medicare PPO

## 2014-11-02 NOTE — Discharge Instructions (Signed)
° °  Supplemental Discharge Instructions for  Pacemaker/Defibrillator Patients  Activity No heavy lifting or vigorous activity with your left/right arm for 6 to 8 weeks.  Do not raise your left/right arm above your head for one week.  Gradually raise your affected arm as drawn below.                       11/15                   11/16                    11/17                    11/18            NO DRIVING for  1 week    ; you may begin driving on     07/6510/19     . WOUND CARE - Keep the wound area clean and dry.  Do not get this area wet for one week. No showers for one week; you may shower on      11/19        . - The tape/steri-strips on your wound will fall off; do not pull them off.  No bandage is needed on the site.  DO  NOT apply any creams, oils, or ointments to the wound area. - If you notice any drainage or discharge from the wound, any swelling or bruising at the site, or you develop a fever > 101? F after you are discharged home, call the office at once.  Special Instructions - You are still able to use cellular telephones; use the ear opposite the side where you have your pacemaker/defibrillator.  Avoid carrying your cellular phone near your device. - When traveling through airports, show security personnel your identification card to avoid being screened in the metal detectors.  Ask the security personnel to use the hand wand. - Avoid arc welding equipment, MRI testing (magnetic resonance imaging), TENS units (transcutaneous nerve stimulators).  Call the office for questions about other devices. - Avoid electrical appliances that are in poor condition or are not properly grounded. - Microwave ovens are safe to be near or to operate.  DO NOT DRIVE YOURSELF OR A FAMILY MEMBER WITH A DEVICE PROBLEM TO THE HOSPITAL--CALL 911.

## 2014-11-02 NOTE — Discharge Summary (Signed)
ELECTROPHYSIOLOGY PROCEDURE DISCHARGE SUMMARY    Patient ID: Lauren Henry,  MRN: [id number], DOB/AGE: 73/02/1941 73 y.o.  Admit date: 10/30/2014 Discharge date: 11/02/2014  Primary Care Physician: Gaspar GarbeISOVEC,RICHARD W, MD Electrophysiologist: Ladona Ridgelaylor  Primary Discharge Diagnosis:  Symptomatic bradycardia (complete heart block) status post pacemaker implantation this admission  Secondary Discharge Diagnosis:  1.  Hypertension 2.  Graves disease - now hypothyroid s/p treatment - on thyroid replacement 3.  Hyperlipidemia 4.  Osteoarthritis   No Known Allergies   Procedures This Admission:  1.  Implantation of a MDT dual chamber PPM on 11-01-14 by Dr Ladona Ridgelaylor.  The patient received a MDT model number [id number] PPM with model number 5076 right atrial lead and 5076 right ventricular lead. There were no immediate post procedure complications. 2.  CXR on 11-02-14 demonstrated no pneumothorax status post device implantation.   Brief HPI/Hospital Course:  Lauren Henry is a 73 y.o. female with a past medical history as outlined above.  She presented on the day of admission for elective knee replacement surgery and was found to be in complete heart block.  Her AVN blocking agents were allowed to wash out without improvement in conduction.  She had had intermittent dizziness over the 5 days prior to admission.  There were no other reversible causes of heart block identified.  Risks, benefits, and alternatives to pacemaker implantation were reviewed with the patient who wished to proceed.   The patient underwent implantation of a MDT dual chamber pacemaker with details as outlined above.  She  was monitored on telemetry overnight which demonstrated sinus rhythm with ventricular pacing.  Left chest was without hematoma or ecchymosis.  The device was interrogated and found to be functioning normally.  CXR was obtained and demonstrated no pneumothorax status post device implantation.  Wound care, arm  mobility, and restrictions were reviewed with the patient.  Dr Johney FrameAllred examined the patient and considered them stable for discharge to home.   Bedside echo done the day of discharge demonstrated no effusion.  She ambulated without chest pain or shortness of breath.    Physical Exam: Filed Vitals:   11/02/14 0500 11/02/14 0620 11/02/14 0658 11/02/14 0825  BP:  165/70 165/70 138/57  Pulse:  60  62  Temp:  97.9 F (36.6 C)  97.7 F (36.5 C)  TempSrc:  Oral  Oral  Resp:  20  18  Height:      Weight: 188 lb 0.8 oz (85.3 kg)     SpO2:  97%  97%    GEN- The patient is well appearing, alert and oriented x 3 today.   Head- normocephalic, atraumatic Eyes-  Sclera clear, conjunctiva pink Ears- hearing intact Oropharynx- clear Neck- supple, Lungs- Clear to ausculation bilaterally, normal work of breathing Heart- Regular rate and rhythm, no murmurs, rubs or gallops, PMI not laterally displaced GI- soft, NT, ND, + BS Extremities- no clubbing, cyanosis, or edema MS- no significant deformity or atrophy Skin- no hematoma     Labs:   Lab Results  Component Value Date   WBC 7.2 11/01/2014   HGB 12.3 11/01/2014   HCT 37.5 11/01/2014   MCV 94.0 11/01/2014   PLT 161 11/01/2014     Recent Labs Lab 10/30/14 1008 10/31/14 0214  NA 142 145  K 4.2 4.3  CL 104 108  CO2 22 24  BUN 31* 26*  CREATININE 1.14* 1.17*  CALCIUM 9.6 9.1  PROT 6.9  --   BILITOT 0.6  --  ALKPHOS 60  --   ALT 31  --   AST 19  --   GLUCOSE 109* 95    Discharge Medications:    Medication List    TAKE these medications        acetaminophen 500 MG tablet  Commonly known as:  TYLENOL  Take 500 mg by mouth at bedtime. For leg pain     aspirin 325 MG tablet  Take 325 mg by mouth daily.     AZOR 5-20 MG per tablet  Generic drug:  amLODipine-olmesartan  Take 1 tablet by mouth daily.     carvedilol 20 MG 24 hr capsule  Commonly known as:  COREG CR  Take 20 mg by mouth daily.     levothyroxine 200  MCG tablet  Commonly known as:  SYNTHROID, LEVOTHROID  Take 200-300 mcg by mouth daily. Tuesday, Thursday, Sunday  300 mcg.     multivitamin tablet  Take 1 tablet by mouth daily.     pravastatin 40 MG tablet  Commonly known as:  PRAVACHOL  Take 40 mg by mouth daily.     Vitamin D3 5000 UNITS Caps  Take 5,000 Units by mouth daily.        Disposition:  Discharge Instructions    Diet - low sodium heart healthy    Complete by:  As directed      Increase activity slowly    Complete by:  As directed           Follow-up Information    Follow up with [facility] On 11/15/2014.   Why:  Wound and device check at 10:30 am   Contact information:   [address] [place] [phone]       Duration of Discharge Encounter: Greater than 30 minutes including physician time.  Signed,  Hillis RangeJames Lashanti Chambless MD

## 2014-11-03 ENCOUNTER — Telehealth: Payer: Self-pay | Admitting: Internal Medicine

## 2014-11-03 NOTE — Telephone Encounter (Signed)
New message     Patient calling need cardiac clearance for upcoming surgery.

## 2014-11-03 NOTE — Telephone Encounter (Signed)
Discussed with Dr. Ladona Ridgelaylor and she is cleared for surgery.  Low risk.

## 2014-11-09 ENCOUNTER — Telehealth: Payer: Self-pay | Admitting: *Deleted

## 2014-11-09 ENCOUNTER — Encounter: Payer: Self-pay | Admitting: Internal Medicine

## 2014-11-09 ENCOUNTER — Ambulatory Visit (HOSPITAL_COMMUNITY): Payer: Medicare PPO | Attending: Cardiovascular Disease

## 2014-11-09 ENCOUNTER — Ambulatory Visit (INDEPENDENT_AMBULATORY_CARE_PROVIDER_SITE_OTHER): Payer: Medicare PPO | Admitting: Internal Medicine

## 2014-11-09 VITALS — BP 148/83 | HR 80 | Ht 65.0 in

## 2014-11-09 DIAGNOSIS — I480 Paroxysmal atrial fibrillation: Secondary | ICD-10-CM

## 2014-11-09 DIAGNOSIS — R071 Chest pain on breathing: Secondary | ICD-10-CM | POA: Diagnosis not present

## 2014-11-09 DIAGNOSIS — R079 Chest pain, unspecified: Secondary | ICD-10-CM | POA: Insufficient documentation

## 2014-11-09 DIAGNOSIS — R0602 Shortness of breath: Secondary | ICD-10-CM | POA: Insufficient documentation

## 2014-11-09 DIAGNOSIS — I10 Essential (primary) hypertension: Secondary | ICD-10-CM | POA: Diagnosis not present

## 2014-11-09 LAB — MDC_IDC_ENUM_SESS_TYPE_INCLINIC
Battery Voltage: 2.79 V
Brady Statistic AP VP Percent: 45 %
Brady Statistic AS VP Percent: 55 %
Brady Statistic AS VS Percent: 0 %
Date Time Interrogation Session: 20151119161554
Lead Channel Impedance Value: 455 Ohm
Lead Channel Impedance Value: 632 Ohm
Lead Channel Pacing Threshold Amplitude: 1 V
Lead Channel Pacing Threshold Pulse Width: 0.4 ms
Lead Channel Pacing Threshold Pulse Width: 0.4 ms
Lead Channel Sensing Intrinsic Amplitude: 2 mV
Lead Channel Setting Pacing Pulse Width: 0.4 ms
MDC IDC MSMT BATTERY IMPEDANCE: 100 Ohm
MDC IDC MSMT BATTERY REMAINING LONGEVITY: 116 mo
MDC IDC MSMT LEADCHNL RV PACING THRESHOLD AMPLITUDE: 0.75 V
MDC IDC SET LEADCHNL RA PACING AMPLITUDE: 3.5 V
MDC IDC SET LEADCHNL RV PACING AMPLITUDE: 3.5 V
MDC IDC SET LEADCHNL RV SENSING SENSITIVITY: 2.8 mV
MDC IDC STAT BRADY AP VS PERCENT: 0 %

## 2014-11-09 NOTE — Assessment & Plan Note (Signed)
Her blood pressure is slightly elevated today. No change in medications.

## 2014-11-09 NOTE — Telephone Encounter (Signed)
Since implant, pt c/o pain while breathing. No chest tightness. Appt made w/ device clinic 11/10/14 @8 :30 to review timing cycle programming w/ AV delays. Pt Vp 99% since implant.

## 2014-11-09 NOTE — Assessment & Plan Note (Addendum)
The etiology of her symptoms is unclear. It's unusual for somebody over a week out from pacemaker insertion to develop perforation. In addition, her pacing thresholds are normal. Her chest pain is somewhat pleuritic, although it does not get worse lying down in fact appears to be worse standing. Today we will plan on obtaining a 2-D echo. If she has evidence of right ventricular enlargement, she would be admitted to rule out pulmonary embolism. If there is any evidence of perforation including pericardial effusion, she would be admitted to undergo pacemaker lead revision. Finally, there is no evidence that her symptoms represent coronary ischemia.

## 2014-11-09 NOTE — Progress Notes (Signed)
HPI Lauren Henry returns today for an unscheduled visit for evaluation of chest pain and shortness of breath. She is a very pleasant 73 year old woman who was admitted to the hospital approximately 11 days ago with symptomatic bradycardia secondary to complete heart block. She underwent permanent pacemaker insertion. She notes some dyspnea at home, but developed chest discomfort yesterday, occurring when standing, and made worse by deep breathing. The symptoms come and go. She has had no syncope. There is no radiation of the pain. Lying down does not make things worse. Her pacemaker was interrogated today and her wound was evaluated. There is no hematoma. Underlying pacemaker function was normal. She did have evidence of atrial fibrillation which she was unaware of. No Known Allergies   Current Outpatient Prescriptions  Medication Sig Dispense Refill  . acetaminophen (TYLENOL) 500 MG tablet Take 500 mg by mouth at bedtime. For leg pain    . amLODipine-olmesartan (AZOR) 5-20 MG per tablet Take 1 tablet by mouth daily.    Marland Kitchen. aspirin 325 MG tablet Take 325 mg by mouth daily.    . carvedilol (COREG CR) 20 MG 24 hr capsule Take 20 mg by mouth daily.    . Cholecalciferol (VITAMIN D3) 5000 UNITS CAPS Take 5,000 Units by mouth daily.    Marland Kitchen. levothyroxine (SYNTHROID, LEVOTHROID) 200 MCG tablet Take 200-300 mcg by mouth daily. Tuesday, Thursday, Sunday  300 mcg.    . Multiple Vitamin (MULTIVITAMIN) tablet Take 1 tablet by mouth daily.    . pravastatin (PRAVACHOL) 40 MG tablet Take 40 mg by mouth daily.     No current facility-administered medications for this visit.     Past Medical History  Diagnosis Date  . Hyperlipidemia     takes Pravastatin daily  . Hypothyroidism     takes Synthroid daily  . Hypertension     takes Azor and Metoprolol daily  . Arthritis   . Joint pain   . History of colon polyps   . Urinary urgency   . Urinary frequency   . History of blood transfusion as a child    no abnormal reaction noted  . Diarrhea     since gallbladder removed  . Graves disease   . Atrial fibrillation     ROS:   All systems reviewed and negative except as noted in the HPI.   Past Surgical History  Procedure Laterality Date  . Cholecystectomy  1971  . Stomach stapled  1985  . Joint replacement Right     knee  . Colonoscopy    . Cataract surgery Bilateral   . Abdominal hysterectomy       No family history on file.   History   Social History  . Marital Status: Married    Spouse Name: N/A    Number of Children: N/A  . Years of Education: N/A   Occupational History  . Not on file.   Social History Main Topics  . Smoking status: Never Smoker   . Smokeless tobacco: Never Used  . Alcohol Use: No  . Drug Use: No  . Sexual Activity: Not Currently    Birth Control/ Protection: Surgical   Other Topics Concern  . Not on file   Social History Narrative     BP 148/83 mmHg  Pulse 80  Ht 5\' 5"  (1.651 m)  Physical Exam:  Well appearing 73 year old woman, NAD HEENT: Unremarkable Neck:  6 cm JVD, no thyromegally Back:  No CVA tenderness Lungs:  Clear with  no wheezes, rales, or rhonchi. Her pacemaker pocket had no hematoma or drainage. HEART:  Regular rate rhythm, no murmurs, no rubs, no clicks Abd:  soft, positive bowel sounds, no organomegally, no rebound, no guarding Ext:  2 plus pulses, no edema, no cyanosis, no clubbing Skin:  No rashes no nodules Neuro:  CN II through XII intact, motor grossly intact   DEVICE  Normal device function.  See PaceArt for details.   Assess/Plan:

## 2014-11-09 NOTE — Assessment & Plan Note (Signed)
The patient is in sinus rhythm today, but over the last week and a half, she has been in atrial fibrillation approximately 40% of the time. She will ultimately need anticoagulation. Until we know what is going on with her pacemaker, I will hold off on anticoagulation for now. She is not symptomatic with her atrial fibrillation.

## 2014-11-09 NOTE — Progress Notes (Signed)
2D Echo completed. 11/09/2014 

## 2014-11-10 ENCOUNTER — Telehealth: Payer: Self-pay | Admitting: *Deleted

## 2014-11-10 NOTE — Telephone Encounter (Signed)
Lauren Henry called to say pt is doing much better today. Her symptoms are not occurring today.

## 2014-11-15 ENCOUNTER — Ambulatory Visit (INDEPENDENT_AMBULATORY_CARE_PROVIDER_SITE_OTHER): Payer: Medicare PPO | Admitting: *Deleted

## 2014-11-15 DIAGNOSIS — I442 Atrioventricular block, complete: Secondary | ICD-10-CM

## 2014-11-15 LAB — MDC_IDC_ENUM_SESS_TYPE_INCLINIC
Battery Remaining Longevity: 114 mo
Battery Voltage: 2.79 V
Brady Statistic AP VP Percent: 34 %
Brady Statistic AS VS Percent: 0 %
Lead Channel Impedance Value: 455 Ohm
Lead Channel Pacing Threshold Pulse Width: 0.4 ms
Lead Channel Sensing Intrinsic Amplitude: 11.2 mV
Lead Channel Sensing Intrinsic Amplitude: 2 mV
Lead Channel Setting Pacing Amplitude: 3.5 V
Lead Channel Setting Pacing Pulse Width: 0.4 ms
MDC IDC MSMT BATTERY IMPEDANCE: 100 Ohm
MDC IDC MSMT LEADCHNL RA PACING THRESHOLD AMPLITUDE: 0.75 V
MDC IDC MSMT LEADCHNL RA PACING THRESHOLD PULSEWIDTH: 0.4 ms
MDC IDC MSMT LEADCHNL RV IMPEDANCE VALUE: 563 Ohm
MDC IDC MSMT LEADCHNL RV PACING THRESHOLD AMPLITUDE: 0.75 V
MDC IDC SESS DTM: 20151125105540
MDC IDC SET LEADCHNL RV PACING AMPLITUDE: 3.5 V
MDC IDC SET LEADCHNL RV SENSING SENSITIVITY: 2.8 mV
MDC IDC STAT BRADY AP VS PERCENT: 0 %
MDC IDC STAT BRADY AS VP PERCENT: 66 %

## 2014-11-15 MED ORDER — APIXABAN 5 MG PO TABS: 5.0000 mg | ORAL_TABLET | Freq: Two times a day (BID) | ORAL | Status: DC

## 2014-11-15 NOTE — Progress Notes (Signed)
Wound check appointment. Steri-strips removed. Wound without redness or edema. Incision edges approximated, wound well healed. Normal device function. Thresholds, sensing, and impedances consistent with implant measurements. Device programmed at 3.5V/auto capture programmed on for extra safety margin until 3 month visit. Histogram distribution appropriate for patient and level of activity. 4 mode switches, 25.9%.  The patient is to start on Eliquis 5mg  bid.  No high ventricular rates noted. Patient educated about wound care, arm mobility, lifting restrictions. ROV in 3 months with implanting physician.

## 2014-11-15 NOTE — Discharge Summary (Signed)
SPORTS MEDICINE & JOINT REPLACEMENT   Georgena SpurlingStephen Lucey, MD   Altamese CabalMaurice Jariyah Hackley, PA-C [address], [place], [place]  [id number]                             [phone]  PATIENT ID: Lauren HitchSandra D Henry        MRN:  [id number]          DOB/AGE: 73/02/1941 / 73 y.o.    DISCHARGE SUMMARY  ADMISSION DATE:    10/30/2014 DISCHARGE DATE:   10/30/2014  ADMISSION DIAGNOSIS: osteoarthritis left knee    DISCHARGE DIAGNOSIS:  osteoarthritis left knee    ADDITIONAL DIAGNOSIS: Active Problems:   * No active hospital problems. *  Past Medical History  Diagnosis Date  . Hyperlipidemia     takes Pravastatin daily  . Hypothyroidism     takes Synthroid daily  . Hypertension     takes Azor and Metoprolol daily  . Arthritis   . Joint pain   . History of colon polyps   . Urinary urgency   . Urinary frequency   . History of blood transfusion as a child    no abnormal reaction noted  . Diarrhea     since gallbladder removed  . Graves disease   . Atrial fibrillation     PROCEDURE: Procedure(s): LEFT TOTAL KNEE ARTHROPLASTY on 10/30/2014  CONSULTS:     HISTORY:  See H&P in chart  HOSPITAL COURSE:  Lauren Henry is a 73 y.o. admitted on 10/30/2014 and found to have a diagnosis of osteoarthritis left knee.  After appropriate laboratory studies were obtained  they were taken to the operating room on 10/30/2014 and underwent Procedure(s): LEFT TOTAL KNEE ARTHROPLASTY.   They were given perioperative antibiotics:  Anti-infectives    Start     Dose/Rate Route Frequency Ordered Stop   10/30/14 0600  ceFAZolin (ANCEF) IVPB 2 g/50 mL premix  Status:  Discontinued     2 g100 mL/hr over 30 Minutes Intravenous On call to O.R. 10/29/14 1415 10/30/14 0738    .  Tolerated the procedure well.  Placed with a foley intraoperatively.  Given Ofirmev at induction and for 48 hours.    POD# 1: Vital signs were stable.  Patient denied Chest pain, shortness of breath, or calf pain.  Patient was started on Lovenox  30 mg subcutaneously twice daily at 8am.  Consults to PT, OT, and care management were made.  The patient was weight bearing as tolerated.  CPM was placed on the operative leg 0-90 degrees for 6-8 hours a day.  Incentive spirometry was taught.  Dressing was changed.  Hemovac was discontinued.      POD #2, Continued  PT for ambulation and exercise program.  IV saline locked.  O2 discontinued.    The remainder of the hospital course was dedicated to ambulation and strengthening.   The patient was discharged on 1 day post op in  Good condition.  Blood products given:none  DIAGNOSTIC STUDIES: Recent vital signs: No data found.      Recent laboratory studies: No results for input(s): WBC, HGB, HCT, PLT in the last 168 hours. No results for input(s): NA, K, CL, CO2, BUN, CREATININE, GLUCOSE, CALCIUM in the last 168 hours. Lab Results  Component Value Date   INR 1.12 11/01/2014   INR 1.15 10/30/2014   INR 1.07 10/19/2014     Recent Radiographic Studies :  Dg Chest 2 View  11/02/2014   CLINICAL DATA:  Post pacemaker insertion  EXAM: CHEST  2 VIEW  COMPARISON:  10/30/2014  FINDINGS: New LEFT subclavian transvenous pacemaker with sequential leads projecting at RIGHT atrium and RIGHT ventricle.  Borderline enlargement of cardiac silhouette.  Atherosclerotic calcification aortic arch.  Mediastinal contours and pulmonary vascularity normal.  Minimal chronic accentuation of interstitial markings stable.  No acute infiltrate, pleural effusion, or pneumothorax.  Bones appear demineralized.  IMPRESSION: No pneumothorax post pacemaker insertion.   Electronically Signed   By: Ulyses SouthwardMark  Boles M.D.   On: 11/02/2014 08:05   Dg Chest 2 View  10/19/2014   CLINICAL DATA:  Preoperative total knee replacement.  Hypertension  EXAM: CHEST  2 VIEW  COMPARISON:  February 24, 2011  FINDINGS: There is minimal apical scarring bilaterally. There is no edema or consolidation. Heart size and pulmonary vascularity are normal. No  adenopathy. There is mild degenerative change in the thoracic spine.  IMPRESSION: No edema or consolidation.   Electronically Signed   By: Bretta BangWilliam  Woodruff M.D.   On: 10/19/2014 16:53   Dg Chest Portable 1 View  10/30/2014   CLINICAL DATA:  Shortness of breath, bradycardia, 3rd degree AV block discovered during preoperative workup for knee surgery, personal history of hypertension, atrial fibrillation  EXAM: PORTABLE CHEST - 1 VIEW  COMPARISON:  Portable exam at 0756 hrs compared to10/29/2015  FINDINGS: Mild enlargement of cardiac silhouette.  Mediastinal contours and pulmonary vascularity normal.  Lungs clear.  No pleural effusion or pneumothorax.  External pacing lead noted.  Scattered endplate spur formation thoracic spine.  IMPRESSION: Mild enlargement of cardiac silhouette.  No acute abnormalities.   Electronically Signed   By: Ulyses SouthwardMark  Boles M.D.   On: 10/30/2014 09:37    DISCHARGE INSTRUCTIONS:   DISCHARGE MEDICATIONS:     Medication List    ASK your doctor about these medications        acetaminophen 500 MG tablet  Commonly known as:  TYLENOL  Take 500 mg by mouth at bedtime. For leg pain     aspirin 325 MG tablet  Take 325 mg by mouth daily.     AZOR 5-20 MG per tablet  Generic drug:  amLODipine-olmesartan  Take 1 tablet by mouth daily.     carvedilol 20 MG 24 hr capsule  Commonly known as:  COREG CR  Take 20 mg by mouth daily.     levothyroxine 200 MCG tablet  Commonly known as:  SYNTHROID, LEVOTHROID  Take 200-300 mcg by mouth daily. Tuesday, Thursday, Sunday  300 mcg.     multivitamin tablet  Take 1 tablet by mouth daily.     pravastatin 40 MG tablet  Commonly known as:  PRAVACHOL  Take 40 mg by mouth daily.     Vitamin D3 5000 UNITS Caps  Take 5,000 Units by mouth daily.        FOLLOW UP VISIT:    DISPOSITION: HOME CONDITION:  Good   Kriste Broman 11/15/2014, 5:27 PM

## 2014-11-30 ENCOUNTER — Encounter (HOSPITAL_COMMUNITY): Payer: Self-pay | Admitting: Internal Medicine

## 2014-12-08 ENCOUNTER — Encounter: Payer: Self-pay | Admitting: Internal Medicine

## 2015-01-04 ENCOUNTER — Encounter (HOSPITAL_COMMUNITY): Payer: Self-pay | Admitting: Orthopedic Surgery

## 2015-02-07 ENCOUNTER — Encounter: Payer: Self-pay | Admitting: Internal Medicine

## 2015-02-07 ENCOUNTER — Ambulatory Visit (INDEPENDENT_AMBULATORY_CARE_PROVIDER_SITE_OTHER): Payer: Medicare PPO | Admitting: Internal Medicine

## 2015-02-07 VITALS — BP 152/82 | HR 69 | Ht 65.0 in | Wt 181.0 lb

## 2015-02-07 DIAGNOSIS — Z950 Presence of cardiac pacemaker: Secondary | ICD-10-CM

## 2015-02-07 DIAGNOSIS — I480 Paroxysmal atrial fibrillation: Secondary | ICD-10-CM

## 2015-02-07 DIAGNOSIS — I10 Essential (primary) hypertension: Secondary | ICD-10-CM

## 2015-02-07 DIAGNOSIS — Z45018 Encounter for adjustment and management of other part of cardiac pacemaker: Secondary | ICD-10-CM

## 2015-02-07 DIAGNOSIS — I442 Atrioventricular block, complete: Secondary | ICD-10-CM

## 2015-02-07 LAB — MDC_IDC_ENUM_SESS_TYPE_INCLINIC
Battery Voltage: 2.8 V
Brady Statistic AP VS Percent: 0 %
Brady Statistic AS VP Percent: 83 %
Brady Statistic AS VS Percent: 0 %
Lead Channel Impedance Value: 631 Ohm
Lead Channel Pacing Threshold Amplitude: 0.75 V
Lead Channel Pacing Threshold Amplitude: 0.75 V
Lead Channel Pacing Threshold Pulse Width: 0.4 ms
Lead Channel Pacing Threshold Pulse Width: 0.4 ms
Lead Channel Sensing Intrinsic Amplitude: 2.8 mV
Lead Channel Setting Pacing Pulse Width: 0.4 ms
MDC IDC MSMT BATTERY IMPEDANCE: 100 Ohm
MDC IDC MSMT BATTERY REMAINING LONGEVITY: 154 mo
MDC IDC MSMT LEADCHNL RA IMPEDANCE VALUE: 461 Ohm
MDC IDC SESS DTM: 20160217102319
MDC IDC SET LEADCHNL RA PACING AMPLITUDE: 1.5 V
MDC IDC SET LEADCHNL RV PACING AMPLITUDE: 2 V
MDC IDC SET LEADCHNL RV SENSING SENSITIVITY: 2.8 mV
MDC IDC STAT BRADY AP VP PERCENT: 17 %

## 2015-02-07 NOTE — Assessment & Plan Note (Signed)
Her blood pressure is slightly elevated today. She is encouraged to lose weight, and reduce her salt intake. She'll continue her antihypertensive medications.

## 2015-02-07 NOTE — Assessment & Plan Note (Signed)
She is out of rhythm approximately 10% of the time. She is asymptomatic. She will continue her current medical therapy.

## 2015-02-07 NOTE — Patient Instructions (Signed)
Your physician recommends that you continue on your current medications as directed. Please refer to the Current Medication list given to you today.  Remote monitoring is used to monitor your Pacemaker of ICD from home. This monitoring reduces the number of office visits required to check your device to one time per year. It allows us to keep an eye on the functioning of your device to ensure it is working properly. You are scheduled for a device check from home on 05/09/15. You may send your transmission at any time that day. If you have a wireless device, the transmission will be sent automatically. After your physician reviews your transmission, you will receive a postcard with your next transmission date.  Your physician wants you to follow-up in: 9 months with Dr. Taylor. You will receive a reminder letter in the mail two months in advance. If you don't receive a letter, please call our office to schedule the follow-up appointment.  

## 2015-02-07 NOTE — Assessment & Plan Note (Signed)
Her Medtronic dual-chamber pacemaker implanted for complete heart block is working normally. We'll plan to recheck in several months.

## 2015-02-07 NOTE — Progress Notes (Signed)
HPI Lauren Henry returns today for pacemaker follow-up. She is a very pleasant 74 year old woman with complete heart block, paroxysmal atrial fibrillation, status post permanent pacemaker insertion. The patient underwent pacemaker insertion several months ago and post procedure had very mild pleuritic chest pain. This is resolved. A 2-D echo demonstrated no evidence of pericardial tamponade. She had a trivial pericardial effusion. Her dyspnea has improved. She has had no syncope. 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 She states that she is retrieved nearly half million chicken eggs in the last year.     No Known Allergies   Current Outpatient Prescriptions  Medication Sig Dispense Refill  . acetaminophen (TYLENOL) 500 MG tablet Take 500 mg by mouth at bedtime. For leg pain    . amLODipine-olmesartan (AZOR) 5-20 MG per tablet Take 1 tablet by mouth daily.    Marland Kitchen apixaban (ELIQUIS) 5 MG TABS tablet Take 1 tablet (5 mg total) by mouth 2 (two) times daily. 60 tablet 2  . carvedilol (COREG CR) 20 MG 24 hr capsule Take 20 mg by mouth daily.    . Cholecalciferol (VITAMIN D3) 5000 UNITS CAPS Take 5,000 Units by mouth daily.    Marland Kitchen levothyroxine (SYNTHROID, LEVOTHROID) 200 MCG tablet Take 200-300 mcg by mouth daily. Tuesday, Thursday, Sunday  300 mcg.    . Multiple Vitamin (MULTIVITAMIN) tablet Take 1 tablet by mouth daily.    . pravastatin (PRAVACHOL) 40 MG tablet Take 40 mg by mouth daily.     No current facility-administered medications for this visit.     Past Medical History  Diagnosis Date  . Hyperlipidemia     takes Pravastatin daily  . Hypothyroidism     takes Synthroid daily  . Hypertension     takes Azor and Metoprolol daily  . Arthritis   . Joint pain   . History of colon polyps   . Urinary urgency   . Urinary frequency   . History of blood transfusion as a child    no abnormal reaction  noted  . Diarrhea     since gallbladder removed  . Graves disease   . Atrial fibrillation     ROS:   All systems reviewed and negative except as noted in the HPI.   Past Surgical History  Procedure Laterality Date  . Cholecystectomy  1971  . Stomach stapled  1985  . Joint replacement Right     knee  . Colonoscopy    . Cataract surgery Bilateral   . Abdominal hysterectomy    . Permanent pacemaker insertion N/A 11/01/2014    Procedure: PERMANENT PACEMAKER INSERTION;  Surgeon: Marinus Maw, MD;  Location: [facility] CATH LAB;  Service: Cardiovascular;  Laterality: N/A;     No family history on file.   History   Social History  . Marital Status: Married    Spouse Name: N/A  . Number of Children: N/A  . Years of Education: N/A   Occupational History  . Not on file.   Social History Main Topics  . Smoking status: Never Smoker   . Smokeless tobacco: Never Used  . Alcohol Use: No  . Drug Use: No  . Sexual Activity: Not Currently    Birth Control/ Protection: Surgical   Other Topics Concern  . Not on file   Social History Narrative     BP 152/82 mmHg  Pulse 69  Ht  (  1.651 m)  Wt 181 lb (82.101 kg)  BMI 30.12 kg/m2  Physical Exam:  Well appearing 74 year old woman, NAD HEENT: Unremarkable Neck:  6 cm JVD, no thyromegally Back:  No CVA tenderness Lungs:  Clear with no wheezes, rales, or rhonchi. Her pacemaker pocket had no hematoma or drainage. HEART:  Regular rate rhythm, no murmurs, no rubs, no clicks Abd:  soft, positive bowel sounds, no organomegally, no rebound, no guarding Ext:  2 plus pulses, no edema, no cyanosis, no clubbing Skin:  No rashes no nodules Neuro:  CN II through XII intact, motor grossly intact   DEVICE  Normal device function.  See PaceArt for details.   Assess/Plan:

## 2015-03-28 ENCOUNTER — Emergency Department (HOSPITAL_COMMUNITY)
Admission: EM | Admit: 2015-03-28 | Discharge: 2015-03-28 | Disposition: A | Discharge status: Home / Self Care | Payer: Medicare PPO | Attending: Emergency Medicine | Admitting: Emergency Medicine

## 2015-03-28 ENCOUNTER — Encounter (HOSPITAL_COMMUNITY): Payer: Self-pay | Admitting: Emergency Medicine

## 2015-03-28 DIAGNOSIS — E039 Hypothyroidism, unspecified: Secondary | ICD-10-CM | POA: Insufficient documentation

## 2015-03-28 DIAGNOSIS — Z950 Presence of cardiac pacemaker: Secondary | ICD-10-CM | POA: Diagnosis not present

## 2015-03-28 DIAGNOSIS — Z86010 Personal history of colonic polyps: Secondary | ICD-10-CM | POA: Insufficient documentation

## 2015-03-28 DIAGNOSIS — Z79899 Other long term (current) drug therapy: Secondary | ICD-10-CM | POA: Diagnosis not present

## 2015-03-28 DIAGNOSIS — I10 Essential (primary) hypertension: Secondary | ICD-10-CM | POA: Insufficient documentation

## 2015-03-28 DIAGNOSIS — E785 Hyperlipidemia, unspecified: Secondary | ICD-10-CM | POA: Diagnosis not present

## 2015-03-28 DIAGNOSIS — M1990 Unspecified osteoarthritis, unspecified site: Secondary | ICD-10-CM | POA: Diagnosis not present

## 2015-03-28 DIAGNOSIS — Z7902 Long term (current) use of antithrombotics/antiplatelets: Secondary | ICD-10-CM | POA: Insufficient documentation

## 2015-03-28 DIAGNOSIS — I4891 Unspecified atrial fibrillation: Secondary | ICD-10-CM | POA: Insufficient documentation

## 2015-03-28 LAB — COMPREHENSIVE METABOLIC PANEL
ALBUMIN: 4.2 g/dL (ref 3.5–5.2)
ALK PHOS: 81 U/L (ref 39–117)
ALT: 16 U/L (ref 0–35)
ANION GAP: 11 (ref 5–15)
AST: 19 U/L (ref 0–37)
BUN: 22 mg/dL (ref 6–23)
CO2: 26 mmol/L (ref 19–32)
Calcium: 9.7 mg/dL (ref 8.4–10.5)
Chloride: 101 mmol/L (ref 96–112)
Creatinine, Ser: 0.93 mg/dL (ref 0.50–1.10)
GFR calc Af Amer: 69 mL/min — ABNORMAL LOW (ref >90)
GFR calc non Af Amer: 60 mL/min — ABNORMAL LOW (ref >90)
Glucose, Bld: 111 mg/dL — ABNORMAL HIGH (ref 70–99)
Potassium: 3.5 mmol/L (ref 3.5–5.1)
SODIUM: 138 mmol/L (ref 135–145)
TOTAL PROTEIN: 7 g/dL (ref 6.0–8.3)
Total Bilirubin: 0.8 mg/dL (ref 0.3–1.2)

## 2015-03-28 LAB — CBC WITH DIFFERENTIAL/PLATELET
Basophils Absolute: 0 10*3/uL (ref 0.0–0.1)
Basophils Relative: 1 % (ref 0–1)
EOS PCT: 4 % (ref 0–5)
Eosinophils Absolute: 0.2 10*3/uL (ref 0.0–0.7)
HCT: 38.2 % (ref 36.0–46.0)
Hemoglobin: 12.8 g/dL (ref 12.0–15.0)
LYMPHS PCT: 38 % (ref 12–46)
Lymphs Abs: 2.4 10*3/uL (ref 0.7–4.0)
MCH: 29.8 pg (ref 26.0–34.0)
MCHC: 33.5 g/dL (ref 30.0–36.0)
MCV: 88.8 fL (ref 78.0–100.0)
Monocytes Absolute: 0.5 10*3/uL (ref 0.1–1.0)
Monocytes Relative: 7 % (ref 3–12)
NEUTROS ABS: 3.3 10*3/uL (ref 1.7–7.7)
Neutrophils Relative %: 50 % (ref 43–77)
PLATELETS: 209 10*3/uL (ref 150–400)
RBC: 4.3 MIL/uL (ref 3.87–5.11)
RDW: 13.9 % (ref 11.5–15.5)
WBC: 6.4 10*3/uL (ref 4.0–10.5)

## 2015-03-28 NOTE — Discharge Instructions (Signed)
Follow up with your md next week. °

## 2015-03-28 NOTE — ED Notes (Signed)
Pt. reports elevated blood pressure at home this evening 185/90 , denies pain or discomfort , respirations unlabored .

## 2015-03-28 NOTE — ED Provider Notes (Signed)
CSN: [id number]     Arrival date & time 03/28/15  1920 History   First MD Initiated Contact with Patient 03/28/15 1941     Chief Complaint  Patient presents with  . Hypertension     (Consider location/radiation/quality/duration/timing/severity/associated sxs/prior Treatment) Patient is a 74 y.o. female presenting with hypertension. The history is provided by the patient (pt was told to come to the er for high bp.  she has no complaints).  Hypertension This is a recurrent problem. The current episode started 12 to 24 hours ago. The problem occurs rarely. The problem has been gradually improving. Pertinent negatives include no chest pain, no abdominal pain and no headaches. Nothing aggravates the symptoms. Nothing relieves the symptoms.    Past Medical History  Diagnosis Date  . Hyperlipidemia     takes Pravastatin daily  . Hypothyroidism     takes Synthroid daily  . Hypertension     takes Azor and Metoprolol daily  . Arthritis   . Joint pain   . History of colon polyps   . Urinary urgency   . Urinary frequency   . History of blood transfusion as a child    no abnormal reaction noted  . Diarrhea     since gallbladder removed  . Graves disease   . Atrial fibrillation    Past Surgical History  Procedure Laterality Date  . Cholecystectomy  1971  . Stomach stapled  1985  . Joint replacement Right     knee  . Colonoscopy    . Cataract surgery Bilateral   . Abdominal hysterectomy    . Permanent pacemaker insertion N/A 11/01/2014    Procedure: PERMANENT PACEMAKER INSERTION;  Surgeon: Marinus MawGregg W Taylor, MD;  [facility];  Service: Cardiovascular;  Laterality: N/A;   No family history on file. History  Substance Use Topics  . Smoking status: Never Smoker   . Smokeless tobacco: Never Used  . Alcohol Use: No   OB History    No data available     Review of Systems  Constitutional: Negative for appetite change and fatigue.  HENT: Negative for congestion, ear  discharge and sinus pressure.   Eyes: Negative for discharge.  Respiratory: Negative for cough.   Cardiovascular: Negative for chest pain.  Gastrointestinal: Negative for abdominal pain and diarrhea.  Genitourinary: Negative for frequency and hematuria.  Musculoskeletal: Negative for back pain.  Skin: Negative for rash.  Neurological: Negative for seizures and headaches.  Psychiatric/Behavioral: Negative for hallucinations.      Allergies  Review of patient's allergies indicates no known allergies.  Home Medications   Prior to Admission medications   Medication Sig Start Date End Date Taking? Authorizing Provider  acetaminophen (TYLENOL) 500 MG tablet Take 500 mg by mouth at bedtime as needed (leg pain). For leg pain   Yes Historical Provider, MD  amLODipine-olmesartan (AZOR) 5-20 MG per tablet Take 1 tablet by mouth daily.   Yes Historical Provider, MD  apixaban (ELIQUIS) 5 MG TABS tablet Take 1 tablet (5 mg total) by mouth 2 (two) times daily. 11/15/14  Yes Marinus MawGregg W Taylor, MD  carvedilol (COREG CR) 20 MG 24 hr capsule Take 20 mg by mouth daily.   Yes Historical Provider, MD  Cholecalciferol (VITAMIN D3) 5000 UNITS CAPS Take 5,000 Units by mouth daily.   Yes Historical Provider, MD  levothyroxine (SYNTHROID, LEVOTHROID) 200 MCG tablet Take 200-300 mcg by mouth daily. Take 300mcg on Tuesday, Thursday, Sunday ONLY Take 200mcg all other days   Yes  Historical Provider, MD  Multiple Vitamin (MULTIVITAMIN) tablet Take 1 tablet by mouth daily.   Yes Historical Provider, MD  pravastatin (PRAVACHOL) 40 MG tablet Take 40 mg by mouth daily.   Yes Historical Provider, MD   BP 147/71 mmHg  Pulse 66  Temp(Src) 98.2 F (36.8 C) (Oral)  Resp 18  Ht  (1.651 m)  Wt 185 lb (83.915 kg)  BMI 30.79 kg/m2  SpO2 98% Physical Exam  Constitutional: She is oriented to person, place, and time. She appears well-developed.  HENT:  Head: Normocephalic.  Eyes: Conjunctivae and EOM are normal. No  scleral icterus.  Neck: Neck supple. No thyromegaly present.  Cardiovascular: Normal rate and regular rhythm.  Exam reveals no gallop and no friction rub.   No murmur heard. Pulmonary/Chest: No stridor. She has no wheezes. She has no rales. She exhibits no tenderness.  Abdominal: She exhibits no distension. There is no tenderness. There is no rebound.  Musculoskeletal: Normal range of motion. She exhibits no edema.  Lymphadenopathy:    She has no cervical adenopathy.  Neurological: She is oriented to person, place, and time. She exhibits normal muscle tone. Coordination normal.  Skin: No rash noted. No erythema.  Psychiatric: She has a normal mood and affect. Her behavior is normal.    ED Course  Procedures (including critical care time) Labs Review Labs Reviewed  COMPREHENSIVE METABOLIC PANEL - Abnormal; Notable for the following:    Glucose, Bld 111 (*)    GFR calc non Af Amer 60 (*)    GFR calc Af Amer 69 (*)    All other components within normal limits  CBC WITH DIFFERENTIAL/PLATELET    Imaging Review No results found.   EKG Interpretation None      MDM   Final diagnoses:  Essential hypertension    bp contoled with medicine.  Pt will follow up with pcp   Bethann Berkshire, MD 03/28/15 2048

## 2015-03-28 NOTE — ED Notes (Signed)
PT ambulated to restroom.

## 2015-04-21 ENCOUNTER — Other Ambulatory Visit: Payer: Self-pay | Admitting: Internal Medicine

## 2015-05-09 ENCOUNTER — Ambulatory Visit (INDEPENDENT_AMBULATORY_CARE_PROVIDER_SITE_OTHER): Payer: Medicare PPO | Admitting: *Deleted

## 2015-05-09 DIAGNOSIS — I442 Atrioventricular block, complete: Secondary | ICD-10-CM

## 2015-05-09 NOTE — Progress Notes (Signed)
Remote pacemaker transmission.   

## 2015-05-21 LAB — CUP PACEART REMOTE DEVICE CHECK
Battery Voltage: 2.8 V
Brady Statistic AP VP Percent: 15 %
Brady Statistic AP VS Percent: 0 %
Brady Statistic AS VS Percent: 0 %
Lead Channel Impedance Value: 436 Ohm
Lead Channel Impedance Value: 700 Ohm
Lead Channel Pacing Threshold Amplitude: 0.875 V
Lead Channel Pacing Threshold Pulse Width: 0.4 ms
Lead Channel Pacing Threshold Pulse Width: 0.4 ms
Lead Channel Setting Pacing Amplitude: 2 V
Lead Channel Setting Sensing Sensitivity: 2.8 mV
MDC IDC MSMT BATTERY IMPEDANCE: 100 Ohm
MDC IDC MSMT BATTERY REMAINING LONGEVITY: 157 mo
MDC IDC MSMT LEADCHNL RA PACING THRESHOLD AMPLITUDE: 0.75 V
MDC IDC MSMT LEADCHNL RA SENSING INTR AMPL: 1.4 mV
MDC IDC SESS DTM: 20160518114214
MDC IDC SET LEADCHNL RA PACING AMPLITUDE: 1.5 V
MDC IDC SET LEADCHNL RV PACING PULSEWIDTH: 0.4 ms
MDC IDC STAT BRADY AS VP PERCENT: 85 %

## 2015-05-31 DIAGNOSIS — E559 Vitamin D deficiency, unspecified: Secondary | ICD-10-CM | POA: Diagnosis not present

## 2015-05-31 DIAGNOSIS — R030 Elevated blood-pressure reading, without diagnosis of hypertension: Secondary | ICD-10-CM | POA: Diagnosis not present

## 2015-05-31 DIAGNOSIS — I10 Essential (primary) hypertension: Secondary | ICD-10-CM | POA: Diagnosis not present

## 2015-05-31 DIAGNOSIS — E785 Hyperlipidemia, unspecified: Secondary | ICD-10-CM | POA: Diagnosis not present

## 2015-05-31 DIAGNOSIS — Z6831 Body mass index (BMI) 31.0-31.9, adult: Secondary | ICD-10-CM | POA: Diagnosis not present

## 2015-05-31 DIAGNOSIS — Z950 Presence of cardiac pacemaker: Secondary | ICD-10-CM | POA: Diagnosis not present

## 2015-05-31 DIAGNOSIS — I442 Atrioventricular block, complete: Secondary | ICD-10-CM | POA: Diagnosis not present

## 2015-05-31 DIAGNOSIS — Z0000 Encounter for general adult medical examination without abnormal findings: Secondary | ICD-10-CM | POA: Diagnosis not present

## 2015-05-31 DIAGNOSIS — M1990 Unspecified osteoarthritis, unspecified site: Secondary | ICD-10-CM | POA: Diagnosis not present

## 2015-06-01 ENCOUNTER — Encounter: Payer: Self-pay | Admitting: Cardiology

## 2015-06-05 ENCOUNTER — Encounter: Payer: Self-pay | Admitting: Internal Medicine

## 2015-06-14 DIAGNOSIS — I10 Essential (primary) hypertension: Secondary | ICD-10-CM | POA: Diagnosis not present

## 2015-06-14 DIAGNOSIS — I442 Atrioventricular block, complete: Secondary | ICD-10-CM | POA: Diagnosis not present

## 2015-06-14 DIAGNOSIS — Z6831 Body mass index (BMI) 31.0-31.9, adult: Secondary | ICD-10-CM | POA: Diagnosis not present

## 2015-06-14 DIAGNOSIS — Z950 Presence of cardiac pacemaker: Secondary | ICD-10-CM | POA: Diagnosis not present

## 2015-06-14 DIAGNOSIS — R030 Elevated blood-pressure reading, without diagnosis of hypertension: Secondary | ICD-10-CM | POA: Diagnosis not present

## 2015-06-14 DIAGNOSIS — F419 Anxiety disorder, unspecified: Secondary | ICD-10-CM | POA: Diagnosis not present

## 2015-06-14 DIAGNOSIS — R05 Cough: Secondary | ICD-10-CM | POA: Diagnosis not present

## 2015-06-27 DIAGNOSIS — Z961 Presence of intraocular lens: Secondary | ICD-10-CM | POA: Diagnosis not present

## 2015-06-27 DIAGNOSIS — H26492 Other secondary cataract, left eye: Secondary | ICD-10-CM | POA: Diagnosis not present

## 2015-06-27 DIAGNOSIS — H52223 Regular astigmatism, bilateral: Secondary | ICD-10-CM | POA: Diagnosis not present

## 2015-06-27 DIAGNOSIS — H524 Presbyopia: Secondary | ICD-10-CM | POA: Diagnosis not present

## 2015-06-27 DIAGNOSIS — H5213 Myopia, bilateral: Secondary | ICD-10-CM | POA: Diagnosis not present

## 2015-08-13 ENCOUNTER — Ambulatory Visit (INDEPENDENT_AMBULATORY_CARE_PROVIDER_SITE_OTHER): Payer: Medicare PPO | Admitting: *Deleted

## 2015-08-13 DIAGNOSIS — I442 Atrioventricular block, complete: Secondary | ICD-10-CM

## 2015-08-14 NOTE — Progress Notes (Signed)
Remote pacemaker transmission.   

## 2015-08-15 DIAGNOSIS — L089 Local infection of the skin and subcutaneous tissue, unspecified: Secondary | ICD-10-CM | POA: Diagnosis not present

## 2015-08-15 DIAGNOSIS — Z6831 Body mass index (BMI) 31.0-31.9, adult: Secondary | ICD-10-CM | POA: Diagnosis not present

## 2015-08-17 LAB — CUP PACEART REMOTE DEVICE CHECK
Battery Voltage: 2.8 V
Brady Statistic AP VP Percent: 18 %
Brady Statistic AP VS Percent: 0 %
Brady Statistic AS VP Percent: 82 %
Date Time Interrogation Session: 20160822111411
Lead Channel Impedance Value: 430 Ohm
Lead Channel Impedance Value: 664 Ohm
Lead Channel Pacing Threshold Amplitude: 0.625 V
Lead Channel Pacing Threshold Amplitude: 0.75 V
Lead Channel Pacing Threshold Pulse Width: 0.4 ms
Lead Channel Sensing Intrinsic Amplitude: 1 mV
Lead Channel Setting Pacing Pulse Width: 0.4 ms
Lead Channel Setting Sensing Sensitivity: 2.8 mV
MDC IDC MSMT BATTERY IMPEDANCE: 100 Ohm
MDC IDC MSMT BATTERY REMAINING LONGEVITY: 156 mo
MDC IDC MSMT LEADCHNL RV PACING THRESHOLD PULSEWIDTH: 0.4 ms
MDC IDC SET LEADCHNL RA PACING AMPLITUDE: 1.5 V
MDC IDC SET LEADCHNL RV PACING AMPLITUDE: 2 V
MDC IDC STAT BRADY AS VS PERCENT: 0 %

## 2015-08-22 ENCOUNTER — Encounter: Payer: Self-pay | Admitting: Cardiology

## 2015-08-28 ENCOUNTER — Encounter: Payer: Self-pay | Admitting: Internal Medicine

## 2015-09-05 ENCOUNTER — Telehealth: Payer: Self-pay | Admitting: Internal Medicine

## 2015-09-05 NOTE — Telephone Encounter (Signed)
New message      Pt is having a regular dental cleaning tomorrow.  Should she stop her eliquis 24hrs prior to cleaning?

## 2015-09-05 NOTE — Telephone Encounter (Signed)
Spoke with pt.  She does not need to hold Eliquis for dental procedure.

## 2015-09-15 DIAGNOSIS — Z23 Encounter for immunization: Secondary | ICD-10-CM | POA: Diagnosis not present

## 2015-09-18 DIAGNOSIS — M1712 Unilateral primary osteoarthritis, left knee: Secondary | ICD-10-CM | POA: Diagnosis not present

## 2015-09-18 DIAGNOSIS — Z96651 Presence of right artificial knee joint: Secondary | ICD-10-CM | POA: Diagnosis not present

## 2015-09-19 ENCOUNTER — Telehealth: Payer: Self-pay | Admitting: Internal Medicine

## 2015-09-19 NOTE — Telephone Encounter (Signed)
Walk in pt form-Sports Medicine & Joint Replacement Clearance dropped off [facility] to [facility]

## 2015-09-25 ENCOUNTER — Telehealth: Payer: Self-pay | Admitting: Internal Medicine

## 2015-09-25 NOTE — Telephone Encounter (Signed)
Cleared for surgery--low risk. May hold Eliquis for 2 days prior and restart when bleeding risk acceptable

## 2015-09-25 NOTE — Telephone Encounter (Signed)
New Message  Pt following up on surgical clearance that was dropped off last Tues- 9/27 for her total knee replacement- for Dr Ladona Ridgel to look over. Please call back and discuss.

## 2015-10-01 ENCOUNTER — Other Ambulatory Visit: Payer: Self-pay | Admitting: Orthopedic Surgery

## 2015-10-14 ENCOUNTER — Other Ambulatory Visit: Payer: Self-pay | Admitting: Internal Medicine

## 2015-10-18 ENCOUNTER — Ambulatory Visit (INDEPENDENT_AMBULATORY_CARE_PROVIDER_SITE_OTHER): Payer: Medicare PPO | Admitting: Internal Medicine

## 2015-10-18 ENCOUNTER — Telehealth: Payer: Self-pay

## 2015-10-18 ENCOUNTER — Encounter: Payer: Self-pay | Admitting: Internal Medicine

## 2015-10-18 VITALS — BP 166/92 | HR 79 | Ht 64.0 in | Wt 190.0 lb

## 2015-10-18 DIAGNOSIS — Z950 Presence of cardiac pacemaker: Secondary | ICD-10-CM

## 2015-10-18 DIAGNOSIS — I442 Atrioventricular block, complete: Secondary | ICD-10-CM | POA: Diagnosis not present

## 2015-10-18 DIAGNOSIS — Z01810 Encounter for preprocedural cardiovascular examination: Secondary | ICD-10-CM | POA: Diagnosis not present

## 2015-10-18 DIAGNOSIS — I480 Paroxysmal atrial fibrillation: Secondary | ICD-10-CM

## 2015-10-18 LAB — CUP PACEART INCLINIC DEVICE CHECK
Battery Remaining Longevity: 156 mo
Battery Voltage: 2.79 V
Brady Statistic AS VP Percent: 81 %
Brady Statistic AS VS Percent: 0 %
Implantable Lead Implant Date: 20151111
Implantable Lead Implant Date: 20151111
Implantable Lead Location: 753859
Implantable Lead Model: 5076
Lead Channel Impedance Value: 461 Ohm
Lead Channel Pacing Threshold Amplitude: 0.75 V
Lead Channel Pacing Threshold Amplitude: 0.75 V
Lead Channel Pacing Threshold Amplitude: 1 V
Lead Channel Pacing Threshold Pulse Width: 0.4 ms
Lead Channel Pacing Threshold Pulse Width: 0.4 ms
Lead Channel Pacing Threshold Pulse Width: 0.4 ms
Lead Channel Sensing Intrinsic Amplitude: 2 mV
Lead Channel Setting Pacing Amplitude: 2 V
MDC IDC LEAD LOCATION: 753860
MDC IDC MSMT BATTERY IMPEDANCE: 100 Ohm
MDC IDC MSMT LEADCHNL RA PACING THRESHOLD PULSEWIDTH: 0.4 ms
MDC IDC MSMT LEADCHNL RV IMPEDANCE VALUE: 662 Ohm
MDC IDC MSMT LEADCHNL RV PACING THRESHOLD AMPLITUDE: 1 V
MDC IDC MSMT LEADCHNL RV SENSING INTR AMPL: 11.2 mV
MDC IDC SESS DTM: 20161027121619
MDC IDC SET LEADCHNL RA PACING AMPLITUDE: 1.5 V
MDC IDC SET LEADCHNL RV PACING PULSEWIDTH: 0.4 ms
MDC IDC SET LEADCHNL RV SENSING SENSITIVITY: 4 mV
MDC IDC STAT BRADY AP VP PERCENT: 18 %
MDC IDC STAT BRADY AP VS PERCENT: 0 %

## 2015-10-18 MED ORDER — CARVEDILOL PHOSPHATE ER 10 MG PO CP24
30.0000 mg | ORAL_CAPSULE | Freq: Every day | ORAL | Status: DC
Start: 2015-10-18 — End: 2016-10-04

## 2015-10-18 MED ORDER — APIXABAN 5 MG PO TABS: 5.0000 mg | ORAL_TABLET | Freq: Two times a day (BID) | ORAL | Status: DC

## 2015-10-18 NOTE — Assessment & Plan Note (Signed)
She is low risk for complications from her pending knee replacement surgery. She will need to stop her Elliquis 2 days prior to the procedure and can restart when ok with Dr. Valentina GuLucy. Eliquis becomes therapeutic within 4 hours of ingestion.

## 2015-10-18 NOTE — Patient Instructions (Signed)
Medication Instructions:  Your physician has recommended you make the following change in your medication: 1) INCREASE Coreg to 30 mg daily.  Please note that new prescription will be different.  The tablets will be 10 mg tablets and you will need to take 3 of them daily.  **Stop Eliquis 48 hours prior to your knee surgery. You may restart it when bleeding risk is acceptable.**  Labwork: None ordered  Testing/Procedures: None ordered  Follow-Up: Remote monitoring is used to monitor your Pacemaker of ICD from home. This monitoring reduces the number of office visits required to check your device to one time per year. It allows us to keep an eye on the functioning of your device to ensure it is working properly. You are scheduled for a device check from home on 01/17/16. You may send your transmission at any time that day. If you have a wireless device, the transmission will be sent automatically. After your physician reviews your transmission, you will receive a postcard with your next transmission date.  Your physician wants you to follow-up in: 1 year with Dr. Ladona Ridgelaylor. You will receive a reminder letter in the mail two months in advance. If you don't receive a letter, please call our office to schedule the follow-up appointment.   Any Other Special Instructions Will Be Listed Below (If Applicable).  If you need a refill on your cardiac medications before your next appointment, please call your pharmacy.  Thank you for choosing [facility]!!

## 2015-10-18 NOTE — Assessment & Plan Note (Signed)
Her blood pressure is high. She will increase her coreg to 30 mg daily

## 2015-10-18 NOTE — Progress Notes (Signed)
HPI Lauren Henry returns today for pacemaker follow-up. She is a very pleasant 74 year old woman with complete heart block, paroxysmal atrial fibrillation, status post permanent pacemaker insertion. The patient underwent pacemaker insertion almost a year ago. This is resolved. A 2-D echo demonstrated no evidence of pericardial tamponade. She had a trivial pericardial effusion. Her dyspnea has improved. She has had no syncope. 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 She has had worsening pain in her knee and is pending knee replacement surgery in the next month. No other complaints except she notes that her blood pressure has been high.     No Known Allergies   Current Outpatient Prescriptions  Medication Sig Dispense Refill  . acetaminophen (TYLENOL) 500 MG tablet Take 500 mg by mouth at bedtime as needed (leg pain). For leg pain    . amLODipine-olmesartan (AZOR) 5-20 MG per tablet Take 1 tablet by mouth daily.    . carvedilol (COREG CR) 20 MG 24 hr capsule Take 20 mg by mouth daily.    . Cholecalciferol (VITAMIN D3) 5000 UNITS CAPS Take 5,000 Units by mouth daily.    . clobetasol cream (TEMOVATE) 0.05 % Apply 1 application topically daily as needed (dry skin on legs).   0  . ELIQUIS 5 MG TABS tablet take 1 tablet by mouth twice a day 60 tablet 1  . levothyroxine (SYNTHROID, LEVOTHROID) 200 MCG tablet Take 200-300 mcg by mouth daily. Take 300mcg on Tuesday, Thursday, Sunday ONLY Take 200mcg all other days    . Multiple Vitamin (MULTIVITAMIN) tablet Take 1 tablet by mouth daily.    . pravastatin (PRAVACHOL) 40 MG tablet Take 40 mg by mouth daily.     No current facility-administered medications for this visit.     Past Medical History  Diagnosis Date  . Hyperlipidemia     takes Pravastatin daily  . Hypothyroidism     takes Synthroid daily  . Hypertension     takes Azor and Metoprolol daily  .  Arthritis   . Joint pain   . History of colon polyps   . Urinary urgency   . Urinary frequency   . History of blood transfusion as a child    no abnormal reaction noted  . Diarrhea     since gallbladder removed  . Graves disease   . Atrial fibrillation (HCC)     ROS:   All systems reviewed and negative except as noted in the HPI.   Past Surgical History  Procedure Laterality Date  . Cholecystectomy  1971  . Stomach stapled  1985  . Joint replacement Right     knee  . Colonoscopy    . Cataract surgery Bilateral   . Abdominal hysterectomy    . Permanent pacemaker insertion N/A 11/01/2014    Procedure: PERMANENT PACEMAKER INSERTION;  Surgeon: Marinus MawGregg W Jvon Meroney, MD;  [facility];  Service: Cardiovascular;  Laterality: N/A;     No family history on file.   Social History   Social History  . Marital Status: Married    Spouse Name: N/A  . Number of Children: N/A  . Years of Education: N/A   Occupational History  . Not on file.   Social History Main Topics  . Smoking status: Never Smoker   . Smokeless tobacco: Never Used  . Alcohol Use: No  . Drug Use: No  . Sexual Activity: Not Currently  Birth Control/ Protection: Surgical   Other Topics Concern  . Not on file   Social History Narrative     BP 166/92 mmHg  Pulse 79  Ht  (1.626 m)  Wt 190 lb (86.183 kg)  BMI 32.60 kg/m2  Physical Exam:  Well appearing 74 year old woman, NAD HEENT: Unremarkable Neck:  6 cm JVD, no thyromegally Back:  No CVA tenderness Lungs:  Clear with no wheezes, rales, or rhonchi. Her pacemaker pocket had no hematoma or drainage. HEART:  Regular rate rhythm, no murmurs, no rubs, no clicks Abd:  soft, positive bowel sounds, no organomegally, no rebound, no guarding Ext:  2 plus pulses, no edema, no cyanosis, no clubbing Skin:  No rashes no nodules Neuro:  CN II through XII intact, motor grossly intact   DEVICE  Normal device function.  See PaceArt for details.     Assess/Plan:

## 2015-10-18 NOTE — Assessment & Plan Note (Signed)
She is currently maintaining NSR. She will continue her anti-coagulation.

## 2015-10-18 NOTE — Assessment & Plan Note (Signed)
Her medtronic DDD PM is working normally. Will recheck in several months. 

## 2015-10-18 NOTE — Telephone Encounter (Signed)
Prior auth for Coreg CR 10mg  3 capsules daily, sent to [facility].

## 2015-10-22 ENCOUNTER — Telehealth: Payer: Self-pay

## 2015-10-22 NOTE — Telephone Encounter (Signed)
Coreg CR 10mg  mg approved by [employer] through 10/18/2016.

## 2015-10-25 NOTE — Pre-Procedure Instructions (Addendum)
Kathryne HitchSandra D Vise  10/25/2015      [facility] - [place], [place] - [address] [place] [id number] Phone: [phone] Fax: [phone]  [id number] - [place], [place] - [address] [place] [id number] Phone: [phone] Fax: [phone]    Your procedure is scheduled on Mon, Nov 14   [facility] at 7:00 AM   Call this number if you have problems the morning of surgery:  [phone]   Remember:  Do not eat food or drink liquids after midnight.  Take these medicines the morning of surgery with A SIP OF WATER ,Carvedilol(Coreg),and Synthroid(Levothyroxine)              Per Dr.Taylor's note hold Eliquis 2 days prior to surgery  . No Goody's,BC's,Aleve,Aspirin,Ibuprofen,Motrin,Advil,Fish Oil,or any Herbal Medications.vitamins(d, multi) starting 10/31/15   Do not wear jewelry, make-up or nail polish.  Do not wear lotions, powders, or perfumes.  You may wear deodorant.  Do not shave 48 hours prior to surgery.    Do not bring valuables to the hospital.  [facility] is not responsible for any belongings or valuables.  Contacts, dentures or bridgework may not be worn into surgery.  Leave your suitcase in the car.  After surgery it may be brought to your room.  For patients admitted to the hospital, discharge time will be determined by your treatment team.  Patients discharged the day of surgery will not be allowed to drive home.    Special instructions:  [facility] - Preparing for Surgery  Before surgery, you can play an important role.  Because skin is not sterile, your skin needs to be as free of germs as possible.  You can reduce the number of germs on you skin by washing with CHG (chlorahexidine gluconate) soap before surgery.  CHG is an antiseptic cleaner which kills germs and bonds with the skin to continue killing germs even after washing.  Please DO NOT  use if you have an allergy to CHG or antibacterial soaps.  If your skin becomes reddened/irritated stop using the CHG and inform your nurse when you arrive at Short Stay.  Do not shave (including legs and underarms) for at least 48 hours prior to the first CHG shower.  You may shave your face.  Please follow these instructions carefully:   1.  Shower with CHG Soap the night before surgery and the                                morning of Surgery.  2.  If you choose to wash your hair, wash your hair first as usual with your       normal shampoo.  3.  After you shampoo, rinse your hair and body thoroughly to remove the                      Shampoo.  4.  Use CHG as you would any other liquid soap.  You can apply chg directly       to the skin and wash gently with scrungie or a clean washcloth.  5.  Apply the CHG Soap to your body ONLY FROM THE NECK DOWN.        Do not use on open wounds or open sores.  Avoid contact with your eyes,  ears, mouth and genitals (private parts).  Wash genitals (private parts)       with your normal soap.  6.  Wash thoroughly, paying special attention to the area where your surgery        will be performed.  7.  Thoroughly rinse your body with warm water from the neck down.  8.  DO NOT shower/wash with your normal soap after using and rinsing off       the CHG Soap.  9.  Pat yourself dry with a clean towel.            10.  Wear clean pajamas.            11.  Place clean sheets on your bed the night of your first shower and do not        sleep with pets.  Day of Surgery  Do not apply any lotions/deoderants the morning of surgery.  Please wear clean clothes to the hospital/surgery center.    Please read over the following fact sheets that you were given. Pain Booklet, Coughing and Deep Breathing, MRSA Information and Surgical Site Infection Prevention

## 2015-10-26 ENCOUNTER — Telehealth: Payer: Self-pay | Admitting: Internal Medicine

## 2015-10-26 ENCOUNTER — Encounter (HOSPITAL_COMMUNITY)
Admission: RE | Admit: 2015-10-26 | Discharge: 2015-10-26 | Disposition: A | Discharge status: Home / Self Care | Payer: Medicare PPO | Source: Ambulatory Visit | Attending: Orthopedic Surgery | Admitting: Orthopedic Surgery

## 2015-10-26 ENCOUNTER — Encounter (HOSPITAL_COMMUNITY): Payer: Self-pay

## 2015-10-26 DIAGNOSIS — E039 Hypothyroidism, unspecified: Secondary | ICD-10-CM | POA: Insufficient documentation

## 2015-10-26 DIAGNOSIS — I442 Atrioventricular block, complete: Secondary | ICD-10-CM | POA: Insufficient documentation

## 2015-10-26 DIAGNOSIS — M179 Osteoarthritis of knee, unspecified: Secondary | ICD-10-CM | POA: Insufficient documentation

## 2015-10-26 DIAGNOSIS — I10 Essential (primary) hypertension: Secondary | ICD-10-CM | POA: Insufficient documentation

## 2015-10-26 DIAGNOSIS — Z01818 Encounter for other preprocedural examination: Secondary | ICD-10-CM | POA: Diagnosis not present

## 2015-10-26 DIAGNOSIS — Z950 Presence of cardiac pacemaker: Secondary | ICD-10-CM | POA: Diagnosis not present

## 2015-10-26 DIAGNOSIS — Z7902 Long term (current) use of antithrombotics/antiplatelets: Secondary | ICD-10-CM | POA: Diagnosis not present

## 2015-10-26 DIAGNOSIS — Z79899 Other long term (current) drug therapy: Secondary | ICD-10-CM | POA: Diagnosis not present

## 2015-10-26 DIAGNOSIS — Z01812 Encounter for preprocedural laboratory examination: Secondary | ICD-10-CM | POA: Insufficient documentation

## 2015-10-26 DIAGNOSIS — E785 Hyperlipidemia, unspecified: Secondary | ICD-10-CM | POA: Insufficient documentation

## 2015-10-26 HISTORY — DX: Cardiac arrhythmia, unspecified: I49.9

## 2015-10-26 HISTORY — DX: Presence of cardiac pacemaker: Z95.0

## 2015-10-26 LAB — URINALYSIS, ROUTINE W REFLEX MICROSCOPIC
Bilirubin Urine: NEGATIVE
Glucose, UA: NEGATIVE mg/dL
Hgb urine dipstick: NEGATIVE
Ketones, ur: NEGATIVE mg/dL
NITRITE: NEGATIVE
PROTEIN: NEGATIVE mg/dL
SPECIFIC GRAVITY, URINE: 1.013 (ref 1.005–1.030)
UROBILINOGEN UA: 0.2 mg/dL (ref 0.0–1.0)
pH: 5.5 (ref 5.0–8.0)

## 2015-10-26 LAB — CBC WITH DIFFERENTIAL/PLATELET
Basophils Absolute: 0 10*3/uL (ref 0.0–0.1)
Basophils Relative: 1 %
Eosinophils Absolute: 0.2 10*3/uL (ref 0.0–0.7)
Eosinophils Relative: 4 %
HEMATOCRIT: 39.9 % (ref 36.0–46.0)
HEMOGLOBIN: 13.1 g/dL (ref 12.0–15.0)
LYMPHS ABS: 1.6 10*3/uL (ref 0.7–4.0)
LYMPHS PCT: 24 %
MCH: 30.4 pg (ref 26.0–34.0)
MCHC: 32.8 g/dL (ref 30.0–36.0)
MCV: 92.6 fL (ref 78.0–100.0)
MONOS PCT: 5 %
Monocytes Absolute: 0.4 10*3/uL (ref 0.1–1.0)
NEUTROS ABS: 4.4 10*3/uL (ref 1.7–7.7)
NEUTROS PCT: 66 %
Platelets: 203 10*3/uL (ref 150–400)
RBC: 4.31 MIL/uL (ref 3.87–5.11)
RDW: 13.8 % (ref 11.5–15.5)
WBC: 6.6 10*3/uL (ref 4.0–10.5)

## 2015-10-26 LAB — SURGICAL PCR SCREEN
MRSA, PCR: NEGATIVE
Staphylococcus aureus: NEGATIVE

## 2015-10-26 LAB — URINE MICROSCOPIC-ADD ON

## 2015-10-26 LAB — COMPREHENSIVE METABOLIC PANEL
ALK PHOS: 61 U/L (ref 38–126)
ALT: 16 U/L (ref 14–54)
ANION GAP: 8 (ref 5–15)
AST: 21 U/L (ref 15–41)
Albumin: 4.2 g/dL (ref 3.5–5.0)
BILIRUBIN TOTAL: 0.7 mg/dL (ref 0.3–1.2)
BUN: 22 mg/dL — ABNORMAL HIGH (ref 6–20)
CALCIUM: 10.3 mg/dL (ref 8.9–10.3)
CO2: 27 mmol/L (ref 22–32)
CREATININE: 1.14 mg/dL — AB (ref 0.44–1.00)
Chloride: 104 mmol/L (ref 101–111)
GFR calc non Af Amer: 46 mL/min — ABNORMAL LOW (ref >60)
GFR, EST AFRICAN AMERICAN: 54 mL/min — AB (ref >60)
GLUCOSE: 104 mg/dL — AB (ref 65–99)
Potassium: 3.6 mmol/L (ref 3.5–5.1)
Sodium: 139 mmol/L (ref 135–145)
TOTAL PROTEIN: 7.3 g/dL (ref 6.5–8.1)

## 2015-10-26 LAB — PROTIME-INR
INR: 1.26 (ref 0.00–1.49)
Prothrombin Time: 16 seconds — ABNORMAL HIGH (ref 11.6–15.2)

## 2015-10-26 LAB — APTT: aPTT: 34 seconds (ref 24–37)

## 2015-10-26 NOTE — Telephone Encounter (Signed)
Request for surgical clearance:  1. What type of surgery is being performed? KNEE REPLACEMENT  2. When is this surgery scheduled? 11/05/15  3. Are there any medications that need to be held prior to surgery and how long?  Eliquis 5mg  2x day  (  CAN PT HOLD 3 DAYS INSTEAD OF 2 DAYS)  4. Name of physician performing surgery? DR. lUCEY  5. What is your office phone and fax number? [phone]

## 2015-10-27 LAB — URINE CULTURE

## 2015-10-29 ENCOUNTER — Encounter (HOSPITAL_COMMUNITY): Payer: Self-pay

## 2015-10-29 NOTE — Telephone Encounter (Signed)
Pt has a CHADS score of 1.  Okay to hold Eliquis x 3 days rather than 2 prior to knee replacement.  Will fax to number listed below.

## 2015-10-29 NOTE — Progress Notes (Addendum)
Anesthesia Chart Review: Patient is a 74 year old female scheduled for left TKA on 11/05/15 by Dr. Sherlean FootLucey. (Case is posted for spinal anesthesia, Exparel.) Surgery was initially scheduled for 10/30/14, but she arrived on the day of surgery with reports of dizziness for the previous five days and was found to be in complete heart block with a rate in the 30's. Surgery was canceled, and she was transferred to the ED and ultimately underwent PPM insertion.   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  BMI is consistent with obesity.   PCP is Dr. Wylene Simmerisovec. Cardiologist is Dr. Lewayne BuntingGregg Taylor who has cleared her for surgery with permission to hold Eliquis for two days prior to surgery.  Since case is posted for spinal anesthesia, I have put in a call (and sent a staff message) to Dr. Ladona Ridgelaylor, asking if it is okay to hold Eliquis for three days.   Medications include  Eliquis 5 mg BID, carvedilol, amlodipine/olmesartan, levothyroxine, pravastatin.  02/07/15 EKG: Electronic v-paced rhythm. Underlying rhythm appears to be SR with first degree AVB.   11/09/14 Limited Echo: Trivial pericardial effusion, no evidence for tamponade physiology. There was no significant pericardial effusion on the prior echo on 10/30/2014.  10/30/14 Echo: Study Conclusions - Left ventricle: The cavity size was normal. There was mild focal basal hypertrophy of the septum. Systolic function was normal. The estimated ejection fraction was in the range of 60% to 65%. Wall motion was normal; there were no regional wall motion abnormalities. - Aortic valve: Trileaflet; mildly thickened, mildly calcified leaflets. Transvalvular velocity was minimally increased. There was no stenosis. - Mitral valve: Calcified annulus.  There was mild regurgitation. - Right ventricle: The cavity size was mildly dilated. Wall thickness was normal.  Preoperative labs noted. BUN 22, Cr 1.14, Glucose 104. CBC WNL. PT 16.0, INR 1.26. PTT 34. Urine culture showed multiple species present, suggest recollection.  If no acute changes then I anticipate that she can proceed as planned. I will follow-up with Dr. Lubertha Basqueaylor's office, if I don't hear back from him over the next few days.  Lauren Henry Lauren Coccia, PA-C [facility] Short Stay Center/Anesthesiology Phone [phone] 10/29/2015 11:35 AM  Addendum: I spoke with Lauren Henry, [facility] with [facility]. Dr. Ladona Ridgelaylor has already cleared patient for surgery with permission to hold Eliquis, so by their protocol it would be okay for patient to hold Eliquis for 3 days rather than 2 since spinal anesthesia is being considered and her CHADS score is 1. I have updated anesthesiologist Dr. Okey Dupreose and patient. Her last dose of Eliquis before surgery will be on 11/01/15. She said Dr. Eulah PontMurphy did not discuss this as an anesthesia option, so will will further discuss options with her anesthesiologist on the day of surgery before making final decision, but seem open to spinal anesthesia.  Lauren Henry Lauren Nazario, PA-C [facility]/Anesthesiology Phone [phone] 10/29/2015 5:57 PM  Addendum: Patient called. Started coughing last night, improved with Robitussin. Feels symptoms are from dust. Doesn't feel sick, no fever, or known wheezing. She wanted to know if she could take Robitussin and/or throat lozenges PRN over the weekend. Advised that she could, but to avoid pseudoephedrine containing products, and no cough suppressants or lozenges on the day of surgery. Also advised that if symptoms persist or worsen then she should notify Dr. Tobin ChadLucey's  office and consider urgent care evaluation by Sunday as we want her at her baseline for surgery. Again, she expressed that she does not feel sick, "just  allergies."  Lauren Henry [facility] Short Stay Center/Anesthesiology Phone [phone] 11/02/2015 11:39 AM

## 2015-10-30 ENCOUNTER — Encounter: Payer: Self-pay | Admitting: Cardiology

## 2015-11-04 NOTE — Anesthesia Preprocedure Evaluation (Addendum)
Anesthesia Evaluation  Patient identified by MRN, date of birth, ID band Patient awake    Reviewed: Allergy & Precautions, NPO status , Patient's Chart, lab work & pertinent test results  Airway Mallampati: II  TM Distance: >3 FB Neck ROM: Full    Dental  (+) Teeth Intact, Partial Upper, Dental Advisory Given   Pulmonary    breath sounds clear to auscultation       Cardiovascular hypertension,  Rhythm:Regular Rate:Normal     Neuro/Psych    GI/Hepatic   Endo/Other    Renal/GU      Musculoskeletal   Abdominal   Peds  Hematology   Anesthesia Other Findings   Reproductive/Obstetrics                           Anesthesia Physical Anesthesia Plan  ASA: III  Anesthesia Plan: MAC and Spinal   Post-op Pain Management:    Induction: Intravenous  Airway Management Planned: Natural Airway and Simple Face Mask  Additional Equipment:   Intra-op Plan:   Post-operative Plan:   Informed Consent: I have reviewed the patients History and Physical, chart, labs and discussed the procedure including the risks, benefits and alternatives for the proposed anesthesia with the patient or authorized representative who has indicated his/her understanding and acceptance.     Plan Discussed with: CRNA and Anesthesiologist  Anesthesia Plan Comments: (H/O Complete HB with pacer inserted 11/15 Paroxysmal Afib on eliquis, last took on 10/31/15 Hypertension  Plan SAB   Lauren Henry  )        Anesthesia Quick Evaluation

## 2015-11-05 ENCOUNTER — Inpatient Hospital Stay (HOSPITAL_COMMUNITY): Payer: Medicare PPO | Admitting: Vascular Surgery

## 2015-11-05 ENCOUNTER — Encounter (HOSPITAL_COMMUNITY): Payer: Self-pay | Admitting: *Deleted

## 2015-11-05 ENCOUNTER — Inpatient Hospital Stay (HOSPITAL_COMMUNITY)
Admission: RE | Admit: 2015-11-05 | Discharge: 2015-11-06 | DRG: 470 | Disposition: A | Discharge status: Home Health | Payer: Medicare PPO | Source: Ambulatory Visit | Attending: Orthopedic Surgery | Admitting: Orthopedic Surgery

## 2015-11-05 ENCOUNTER — Encounter (HOSPITAL_COMMUNITY)
Admission: RE | Disposition: A | Discharge status: Home Health | Payer: Self-pay | Source: Ambulatory Visit | Attending: Orthopedic Surgery

## 2015-11-05 DIAGNOSIS — D62 Acute posthemorrhagic anemia: Secondary | ICD-10-CM | POA: Diagnosis not present

## 2015-11-05 DIAGNOSIS — M1712 Unilateral primary osteoarthritis, left knee: Principal | ICD-10-CM | POA: Diagnosis present

## 2015-11-05 DIAGNOSIS — E785 Hyperlipidemia, unspecified: Secondary | ICD-10-CM | POA: Diagnosis present

## 2015-11-05 DIAGNOSIS — Z96651 Presence of right artificial knee joint: Secondary | ICD-10-CM | POA: Diagnosis not present

## 2015-11-05 DIAGNOSIS — I10 Essential (primary) hypertension: Secondary | ICD-10-CM | POA: Diagnosis present

## 2015-11-05 DIAGNOSIS — E039 Hypothyroidism, unspecified: Secondary | ICD-10-CM | POA: Diagnosis present

## 2015-11-05 DIAGNOSIS — I480 Paroxysmal atrial fibrillation: Secondary | ICD-10-CM

## 2015-11-05 DIAGNOSIS — Z950 Presence of cardiac pacemaker: Secondary | ICD-10-CM

## 2015-11-05 DIAGNOSIS — M179 Osteoarthritis of knee, unspecified: Secondary | ICD-10-CM | POA: Diagnosis present

## 2015-11-05 DIAGNOSIS — M1710 Unilateral primary osteoarthritis, unspecified knee: Secondary | ICD-10-CM | POA: Diagnosis present

## 2015-11-05 HISTORY — PX: TOTAL KNEE ARTHROPLASTY: SHX125

## 2015-11-05 SURGERY — ARTHROPLASTY, KNEE, TOTAL
Anesthesia: Monitor Anesthesia Care | Site: Knee | Laterality: Left

## 2015-11-05 MED ORDER — CHLORHEXIDINE GLUCONATE 4 % EX LIQD: 60.0000 mL | Freq: Once | CUTANEOUS | Status: DC

## 2015-11-05 MED ORDER — CLOBETASOL PROPIONATE 0.05 % EX CREA
1.0000 "application " | TOPICAL_CREAM | Freq: Every day | CUTANEOUS | Status: DC | PRN
  Filled 2015-11-05: qty 15

## 2015-11-05 MED ORDER — HYDROMORPHONE HCL 1 MG/ML IJ SOLN
INTRAMUSCULAR | Status: AC
  Administered 2015-11-05: 0.5 mg via INTRAVENOUS
  Filled 2015-11-05: qty 1

## 2015-11-05 MED ORDER — PROPOFOL 10 MG/ML IV BOLUS
INTRAVENOUS | Status: AC
  Filled 2015-11-05: qty 20

## 2015-11-05 MED ORDER — ALUM & MAG HYDROXIDE-SIMETH 200-200-20 MG/5ML PO SUSP: 30.0000 mL | ORAL | Status: DC | PRN

## 2015-11-05 MED ORDER — SODIUM CHLORIDE 0.9 % IR SOLN
Status: DC | PRN
  Administered 2015-11-05: 1000 mL

## 2015-11-05 MED ORDER — BUPIVACAINE LIPOSOME 1.3 % IJ SUSP
20.0000 mL | Freq: Once | INTRAMUSCULAR | Status: DC
  Filled 2015-11-05: qty 20

## 2015-11-05 MED ORDER — TRANEXAMIC ACID 1000 MG/10ML IV SOLN
1000.0000 mg | INTRAVENOUS | Status: AC
  Administered 2015-11-05: 1000 mg via INTRAVENOUS
  Filled 2015-11-05: qty 10

## 2015-11-05 MED ORDER — MIDAZOLAM HCL 2 MG/2ML IJ SOLN
INTRAMUSCULAR | Status: AC
  Filled 2015-11-05: qty 2

## 2015-11-05 MED ORDER — AMLODIPINE-OLMESARTAN 5-20 MG PO TABS: 3.0000 | ORAL_TABLET | Freq: Every day | ORAL | Status: DC

## 2015-11-05 MED ORDER — MIDAZOLAM HCL 2 MG/2ML IJ SOLN
INTRAMUSCULAR | Status: AC
  Filled 2015-11-05: qty 4

## 2015-11-05 MED ORDER — HYDROMORPHONE HCL 1 MG/ML IJ SOLN: 1.0000 mg | INTRAMUSCULAR | Status: DC | PRN

## 2015-11-05 MED ORDER — DOCUSATE SODIUM 100 MG PO CAPS
100.0000 mg | ORAL_CAPSULE | Freq: Two times a day (BID) | ORAL | Status: DC
  Administered 2015-11-05 – 2015-11-06 (×2): 100 mg via ORAL
  Filled 2015-11-05 (×2): qty 1

## 2015-11-05 MED ORDER — POLYETHYLENE GLYCOL 3350 17 G PO PACK: 17.0000 g | PACK | Freq: Every day | ORAL | Status: DC | PRN

## 2015-11-05 MED ORDER — STERILE WATER FOR INJECTION IJ SOLN
INTRAMUSCULAR | Status: AC
  Filled 2015-11-05: qty 10

## 2015-11-05 MED ORDER — BUPIVACAINE-EPINEPHRINE (PF) 0.25% -1:200000 IJ SOLN
INTRAMUSCULAR | Status: AC
  Filled 2015-11-05: qty 30

## 2015-11-05 MED ORDER — ONDANSETRON HCL 4 MG/2ML IJ SOLN: 4.0000 mg | Freq: Four times a day (QID) | INTRAMUSCULAR | Status: DC | PRN

## 2015-11-05 MED ORDER — IRBESARTAN 300 MG PO TABS
300.0000 mg | ORAL_TABLET | Freq: Every day | ORAL | Status: DC
  Administered 2015-11-05 – 2015-11-06 (×2): 300 mg via ORAL
  Filled 2015-11-05: qty 1
  Filled 2015-11-05: qty 2

## 2015-11-05 MED ORDER — MIDAZOLAM HCL 5 MG/5ML IJ SOLN
INTRAMUSCULAR | Status: DC | PRN
  Administered 2015-11-05 (×2): 1 mg via INTRAVENOUS

## 2015-11-05 MED ORDER — ACETAMINOPHEN 325 MG PO TABS: 650.0000 mg | ORAL_TABLET | Freq: Four times a day (QID) | ORAL | Status: DC | PRN

## 2015-11-05 MED ORDER — LACTATED RINGERS IV SOLN
INTRAVENOUS | Status: DC | PRN
  Administered 2015-11-05 (×2): via INTRAVENOUS

## 2015-11-05 MED ORDER — PROPOFOL 500 MG/50ML IV EMUL
INTRAVENOUS | Status: DC | PRN
  Administered 2015-11-05: 50 ug/kg/min via INTRAVENOUS

## 2015-11-05 MED ORDER — ONDANSETRON HCL 4 MG PO TABS: 4.0000 mg | ORAL_TABLET | Freq: Four times a day (QID) | ORAL | Status: DC | PRN

## 2015-11-05 MED ORDER — ZOLPIDEM TARTRATE 5 MG PO TABS: 5.0000 mg | ORAL_TABLET | Freq: Every evening | ORAL | Status: DC | PRN

## 2015-11-05 MED ORDER — BISACODYL 10 MG RE SUPP: 10.0000 mg | Freq: Every day | RECTAL | Status: DC | PRN

## 2015-11-05 MED ORDER — SODIUM CHLORIDE 0.9 % IJ SOLN
INTRAMUSCULAR | Status: DC | PRN
  Administered 2015-11-05: 20 mL

## 2015-11-05 MED ORDER — METOCLOPRAMIDE HCL 5 MG PO TABS: 5.0000 mg | ORAL_TABLET | Freq: Three times a day (TID) | ORAL | Status: DC | PRN

## 2015-11-05 MED ORDER — FENTANYL CITRATE (PF) 100 MCG/2ML IJ SOLN
INTRAMUSCULAR | Status: DC | PRN
  Administered 2015-11-05 (×2): 12.5 ug via INTRAVENOUS
  Administered 2015-11-05: 50 ug via INTRAVENOUS
  Administered 2015-11-05: 12.5 ug via INTRAVENOUS
  Administered 2015-11-05: 50 ug via INTRAVENOUS
  Administered 2015-11-05: 12.5 ug via INTRAVENOUS

## 2015-11-05 MED ORDER — MAGNESIUM CITRATE PO SOLN: 1.0000 | Freq: Once | ORAL | Status: DC | PRN

## 2015-11-05 MED ORDER — CELECOXIB 200 MG PO CAPS
200.0000 mg | ORAL_CAPSULE | Freq: Two times a day (BID) | ORAL | Status: DC
  Administered 2015-11-05 – 2015-11-06 (×3): 200 mg via ORAL
  Filled 2015-11-05 (×3): qty 1

## 2015-11-05 MED ORDER — CEFAZOLIN SODIUM-DEXTROSE 2-3 GM-% IV SOLR
2.0000 g | INTRAVENOUS | Status: AC
Start: 2015-11-05 — End: 2015-11-05
  Administered 2015-11-05: 2 g via INTRAVENOUS
  Filled 2015-11-05: qty 50

## 2015-11-05 MED ORDER — METOCLOPRAMIDE HCL 5 MG/ML IJ SOLN: 5.0000 mg | Freq: Three times a day (TID) | INTRAMUSCULAR | Status: DC | PRN

## 2015-11-05 MED ORDER — SUCCINYLCHOLINE CHLORIDE 20 MG/ML IJ SOLN
INTRAMUSCULAR | Status: AC
  Filled 2015-11-05: qty 1

## 2015-11-05 MED ORDER — ACETAMINOPHEN 650 MG RE SUPP: 650.0000 mg | Freq: Four times a day (QID) | RECTAL | Status: DC | PRN

## 2015-11-05 MED ORDER — ARTIFICIAL TEARS OP OINT
TOPICAL_OINTMENT | OPHTHALMIC | Status: AC
  Filled 2015-11-05: qty 3.5

## 2015-11-05 MED ORDER — BUPIVACAINE-EPINEPHRINE (PF) 0.25% -1:200000 IJ SOLN
INTRAMUSCULAR | Status: DC | PRN
  Administered 2015-11-05: 30 mL via PERINEURAL

## 2015-11-05 MED ORDER — LACTATED RINGERS IV SOLN
INTRAVENOUS | Status: DC
  Administered 2015-11-05: 08:00:00 via INTRAVENOUS

## 2015-11-05 MED ORDER — APIXABAN 2.5 MG PO TABS: 2.5000 mg | ORAL_TABLET | Freq: Two times a day (BID) | ORAL | Status: DC

## 2015-11-05 MED ORDER — FENTANYL CITRATE (PF) 250 MCG/5ML IJ SOLN
INTRAMUSCULAR | Status: AC
  Filled 2015-11-05: qty 5

## 2015-11-05 MED ORDER — METHOCARBAMOL 500 MG PO TABS
500.0000 mg | ORAL_TABLET | Freq: Four times a day (QID) | ORAL | Status: DC | PRN
  Administered 2015-11-05: 500 mg via ORAL

## 2015-11-05 MED ORDER — TRANEXAMIC ACID 1000 MG/10ML IV SOLN
1000.0000 mg | Freq: Once | INTRAVENOUS | Status: DC
  Filled 2015-11-05: qty 10

## 2015-11-05 MED ORDER — FENTANYL CITRATE (PF) 100 MCG/2ML IJ SOLN
INTRAMUSCULAR | Status: AC
  Filled 2015-11-05: qty 2

## 2015-11-05 MED ORDER — LEVOTHYROXINE SODIUM 150 MCG PO TABS
300.0000 ug | ORAL_TABLET | Freq: Every day | ORAL | Status: DC
  Administered 2015-11-06: 300 ug via ORAL
  Filled 2015-11-05 (×2): qty 2

## 2015-11-05 MED ORDER — PHENOL 1.4 % MT LIQD: 1.0000 | OROMUCOSAL | Status: DC | PRN

## 2015-11-05 MED ORDER — HYDROMORPHONE HCL 1 MG/ML IJ SOLN
0.2500 mg | INTRAMUSCULAR | Status: DC | PRN
  Administered 2015-11-05 (×2): 0.5 mg via INTRAVENOUS

## 2015-11-05 MED ORDER — OXYCODONE HCL 5 MG PO TABS
5.0000 mg | ORAL_TABLET | ORAL | Status: DC | PRN
  Administered 2015-11-05 – 2015-11-06 (×3): 10 mg via ORAL
  Administered 2015-11-06: 5 mg via ORAL
  Administered 2015-11-06: 10 mg via ORAL
  Administered 2015-11-06: 5 mg via ORAL
  Filled 2015-11-05 (×5): qty 2

## 2015-11-05 MED ORDER — OXYCODONE HCL 5 MG/5ML PO SOLN: 5.0000 mg | Freq: Once | ORAL | Status: DC | PRN

## 2015-11-05 MED ORDER — METHOCARBAMOL 1000 MG/10ML IJ SOLN: 500.0000 mg | Freq: Four times a day (QID) | INTRAVENOUS | Status: DC | PRN

## 2015-11-05 MED ORDER — METHOCARBAMOL 500 MG PO TABS
ORAL_TABLET | ORAL | Status: AC
  Filled 2015-11-05: qty 1

## 2015-11-05 MED ORDER — PRAVASTATIN SODIUM 40 MG PO TABS
40.0000 mg | ORAL_TABLET | Freq: Every day | ORAL | Status: DC
  Administered 2015-11-05 – 2015-11-06 (×2): 40 mg via ORAL
  Filled 2015-11-05 (×2): qty 1

## 2015-11-05 MED ORDER — BUPIVACAINE LIPOSOME 1.3 % IJ SUSP
INTRAMUSCULAR | Status: DC | PRN
  Administered 2015-11-05: 20 mL

## 2015-11-05 MED ORDER — SODIUM CHLORIDE 0.9 % IV SOLN: INTRAVENOUS | Status: DC

## 2015-11-05 MED ORDER — AMLODIPINE BESYLATE 10 MG PO TABS
10.0000 mg | ORAL_TABLET | Freq: Every day | ORAL | Status: DC
  Administered 2015-11-05 – 2015-11-06 (×2): 10 mg via ORAL
  Filled 2015-11-05 (×2): qty 1

## 2015-11-05 MED ORDER — ROCURONIUM BROMIDE 50 MG/5ML IV SOLN
INTRAVENOUS | Status: AC
  Filled 2015-11-05: qty 1

## 2015-11-05 MED ORDER — OXYCODONE HCL 5 MG PO TABS: 5.0000 mg | ORAL_TABLET | Freq: Once | ORAL | Status: DC | PRN

## 2015-11-05 MED ORDER — TRANEXAMIC ACID 1000 MG/10ML IV SOLN
1000.0000 mg | Freq: Once | INTRAVENOUS | Status: AC
  Administered 2015-11-05: 1000 mg via INTRAVENOUS
  Filled 2015-11-05 (×3): qty 10

## 2015-11-05 MED ORDER — PHENYLEPHRINE 40 MCG/ML (10ML) SYRINGE FOR IV PUSH (FOR BLOOD PRESSURE SUPPORT)
PREFILLED_SYRINGE | INTRAVENOUS | Status: AC
  Filled 2015-11-05: qty 10

## 2015-11-05 MED ORDER — DIPHENHYDRAMINE HCL 12.5 MG/5ML PO ELIX: 12.5000 mg | ORAL_SOLUTION | ORAL | Status: DC | PRN

## 2015-11-05 MED ORDER — MENTHOL 3 MG MT LOZG: 1.0000 | LOZENGE | OROMUCOSAL | Status: DC | PRN

## 2015-11-05 MED ORDER — OXYCODONE HCL ER 10 MG PO T12A
10.0000 mg | EXTENDED_RELEASE_TABLET | Freq: Two times a day (BID) | ORAL | Status: DC
  Administered 2015-11-05 – 2015-11-06 (×3): 10 mg via ORAL
  Filled 2015-11-05 (×3): qty 1

## 2015-11-05 MED ORDER — ONDANSETRON HCL 4 MG/2ML IJ SOLN
INTRAMUSCULAR | Status: DC | PRN
  Administered 2015-11-05: 4 mg via INTRAVENOUS

## 2015-11-05 MED ORDER — CEFAZOLIN SODIUM-DEXTROSE 2-3 GM-% IV SOLR
2.0000 g | Freq: Four times a day (QID) | INTRAVENOUS | Status: AC
Start: 2015-11-05 — End: 2015-11-05
  Administered 2015-11-05 (×2): 2 g via INTRAVENOUS
  Filled 2015-11-05 (×2): qty 50

## 2015-11-05 MED ORDER — ONDANSETRON HCL 4 MG/2ML IJ SOLN: 4.0000 mg | Freq: Once | INTRAMUSCULAR | Status: DC | PRN

## 2015-11-05 MED ORDER — OXYCODONE HCL 5 MG PO TABS
ORAL_TABLET | ORAL | Status: AC
  Filled 2015-11-05: qty 2

## 2015-11-05 MED ORDER — CARVEDILOL PHOSPHATE ER 20 MG PO CP24
20.0000 mg | ORAL_CAPSULE | Freq: Every day | ORAL | Status: DC
  Administered 2015-11-06: 20 mg via ORAL
  Filled 2015-11-05: qty 1

## 2015-11-05 MED ORDER — LIDOCAINE HCL (CARDIAC) 20 MG/ML IV SOLN
INTRAVENOUS | Status: AC
  Filled 2015-11-05: qty 5

## 2015-11-05 MED ORDER — EPHEDRINE SULFATE 50 MG/ML IJ SOLN
INTRAMUSCULAR | Status: AC
  Filled 2015-11-05: qty 1

## 2015-11-05 MED ORDER — ONDANSETRON HCL 4 MG/2ML IJ SOLN
INTRAMUSCULAR | Status: AC
  Filled 2015-11-05: qty 2

## 2015-11-05 MED ORDER — SODIUM CHLORIDE 0.9 % IV SOLN
INTRAVENOUS | Status: DC
  Administered 2015-11-05: 75 mL/h via INTRAVENOUS

## 2015-11-05 MED ORDER — POTASSIUM CHLORIDE IN NACL 20-0.9 MEQ/L-% IV SOLN
INTRAVENOUS | Status: DC
  Administered 2015-11-05 – 2015-11-06 (×2): via INTRAVENOUS
  Filled 2015-11-05 (×2): qty 1000

## 2015-11-05 MED ORDER — PHENYLEPHRINE HCL 10 MG/ML IJ SOLN
INTRAMUSCULAR | Status: DC | PRN
  Administered 2015-11-05 (×9): 40 ug via INTRAVENOUS

## 2015-11-05 SURGICAL SUPPLY — 59 items
BANDAGE ESMARK 6X9 LF (GAUZE/BANDAGES/DRESSINGS) ×1 IMPLANT
BLADE SAGITTAL 13X1.27X60 (BLADE) ×2 IMPLANT
BLADE SAW SGTL 83.5X18.5 (BLADE) ×2 IMPLANT
BLADE SURG 10 STRL SS (BLADE) ×2 IMPLANT
BNDG CMPR 9X6 STRL LF SNTH (GAUZE/BANDAGES/DRESSINGS) ×1
BNDG ESMARK 6X9 LF (GAUZE/BANDAGES/DRESSINGS) ×2
BOWL SMART MIX CTS (DISPOSABLE) ×2 IMPLANT
CAPT KNEE TOTAL 3 ×2 IMPLANT
CEMENT BONE SIMPLEX SPEEDSET (Cement) ×4 IMPLANT
COVER SURGICAL LIGHT HANDLE (MISCELLANEOUS) ×2 IMPLANT
CUFF TOURNIQUET SINGLE 34IN LL (TOURNIQUET CUFF) ×2 IMPLANT
DRAPE EXTREMITY T 121X128X90 (DRAPE) ×2 IMPLANT
DRAPE INCISE IOBAN 66X45 STRL (DRAPES) ×4 IMPLANT
DRAPE PROXIMA HALF (DRAPES) IMPLANT
DRAPE U-SHAPE 47X51 STRL (DRAPES) ×2 IMPLANT
DRSG ADAPTIC 3X8 NADH LF (GAUZE/BANDAGES/DRESSINGS) ×2 IMPLANT
DRSG PAD ABDOMINAL 8X10 ST (GAUZE/BANDAGES/DRESSINGS) ×2 IMPLANT
DURAPREP 26ML APPLICATOR (WOUND CARE) ×4 IMPLANT
ELECT REM PT RETURN 9FT ADLT (ELECTROSURGICAL) ×2
ELECTRODE REM PT RTRN 9FT ADLT (ELECTROSURGICAL) ×1 IMPLANT
GAUZE SPONGE 4X4 12PLY STRL (GAUZE/BANDAGES/DRESSINGS) ×2 IMPLANT
GLOVE BIOGEL M 7.0 STRL (GLOVE) IMPLANT
GLOVE BIOGEL PI IND STRL 7.0 (GLOVE) IMPLANT
GLOVE BIOGEL PI IND STRL 7.5 (GLOVE) IMPLANT
GLOVE BIOGEL PI IND STRL 8.5 (GLOVE) ×2 IMPLANT
GLOVE BIOGEL PI INDICATOR 7.0 (GLOVE) ×2
GLOVE BIOGEL PI INDICATOR 7.5 (GLOVE)
GLOVE BIOGEL PI INDICATOR 8.5 (GLOVE) ×3
GLOVE SURG ORTHO 8.0 STRL STRW (GLOVE) ×4 IMPLANT
GLOVE SURG SS PI 7.0 STRL IVOR (GLOVE) ×2 IMPLANT
GLOVE SURG SS PI 8.0 STRL IVOR (GLOVE) ×1 IMPLANT
GOWN STRL REUS W/ TWL LRG LVL3 (GOWN DISPOSABLE) ×1 IMPLANT
GOWN STRL REUS W/ TWL XL LVL3 (GOWN DISPOSABLE) ×2 IMPLANT
GOWN STRL REUS W/TWL LRG LVL3 (GOWN DISPOSABLE) ×2
GOWN STRL REUS W/TWL XL LVL3 (GOWN DISPOSABLE) ×4
HANDPIECE INTERPULSE COAX TIP (DISPOSABLE) ×2
HOOD PEEL AWAY FACE SHEILD DIS (HOOD) ×6 IMPLANT
KIT BASIN OR (CUSTOM PROCEDURE TRAY) ×2 IMPLANT
KIT ROOM TURNOVER OR (KITS) ×2 IMPLANT
KNEE CAPITATED TOTAL 3 IMPLANT
MANIFOLD NEPTUNE II (INSTRUMENTS) ×2 IMPLANT
NEEDLE 22X1 1/2 (OR ONLY) (NEEDLE) ×4 IMPLANT
NS IRRIG 1000ML POUR BTL (IV SOLUTION) ×2 IMPLANT
PACK TOTAL JOINT (CUSTOM PROCEDURE TRAY) ×2 IMPLANT
PACK UNIVERSAL I (CUSTOM PROCEDURE TRAY) ×2 IMPLANT
PAD ARMBOARD 7.5X6 YLW CONV (MISCELLANEOUS) ×4 IMPLANT
PADDING CAST COTTON 6X4 STRL (CAST SUPPLIES) ×2 IMPLANT
SET HNDPC FAN SPRY TIP SCT (DISPOSABLE) ×1 IMPLANT
STAPLER VISISTAT 35W (STAPLE) ×2 IMPLANT
SUCTION FRAZIER TIP 10 FR DISP (SUCTIONS) ×2 IMPLANT
SUT BONE WAX W31G (SUTURE) ×2 IMPLANT
SUT VIC AB 0 CTB1 27 (SUTURE) ×4 IMPLANT
SUT VIC AB 1 CT1 27 (SUTURE) ×4
SUT VIC AB 1 CT1 27XBRD ANBCTR (SUTURE) ×2 IMPLANT
SUT VIC AB 2-0 CT1 27 (SUTURE) ×4
SUT VIC AB 2-0 CT1 TAPERPNT 27 (SUTURE) ×2 IMPLANT
SYR 20CC LL (SYRINGE) ×4 IMPLANT
TOWEL OR 17X24 6PK STRL BLUE (TOWEL DISPOSABLE) ×2 IMPLANT
TOWEL OR 17X26 10 PK STRL BLUE (TOWEL DISPOSABLE) ×2 IMPLANT

## 2015-11-05 NOTE — Transfer of Care (Signed)
Immediate Anesthesia Transfer of Care Note  Patient: Lauren Henry  Procedure(s) Performed: Procedure(s): TOTAL KNEE ARTHROPLASTY (Left)  Patient Location: PACU  Anesthesia Type:Spinal  Level of Consciousness: awake and alert   Airway & Oxygen Therapy: Patient Spontanous Breathing  Post-op Assessment: Report given to RN, Post -op Vital signs reviewed and stable and Patient moving all extremities  Post vital signs: Reviewed and stable  Last Vitals:  Filed Vitals:   11/05/15 0734  BP: 158/75  Pulse: 72  Temp: 36.6 C  Resp: 18    Complications: No apparent anesthesia complications

## 2015-11-05 NOTE — H&P (Signed)
Lauren Henry MRN:  [id number] DOB/SEX:  [birth year]/female  CHIEF COMPLAINT:  Painful left Knee  HISTORY: Patient is a 74 y.o. female presented with a history of pain in the left knee. Onset of symptoms was gradual starting several years ago with gradually worsening course since that time. Prior procedures on the knee include arthroscopy. Patient has been treated conservatively with over-the-counter NSAIDs and activity modification. Patient currently rates pain in the knee at 10 out of 10 with activity. There is pain at night.  PAST MEDICAL HISTORY: Patient Active Problem List   Diagnosis Date Noted  . Preop cardiovascular exam 10/18/2015  . Pacemaker 02/07/2015  . Chest pain 11/09/2014  . Atrial fibrillation (HCC) 11/09/2014  . Complete heart block by electrocardiogram (HCC) 10/30/2014  . Hypertension 10/30/2014  . Hyperlipidemia 10/30/2014  . Hypothyroidism 10/30/2014  . CHB (complete heart block) (HCC) 10/30/2014  . Third degree AV block (HCC)   . Back pain 08/27/2012  . Right groin pain 01/13/2012   Past Medical History  Diagnosis Date  . Hyperlipidemia     takes Pravastatin daily  . Hypothyroidism     takes Synthroid daily  . Hypertension     takes Azor and Metoprolol daily  . Arthritis   . Joint pain   . History of colon polyps   . Urinary urgency   . Urinary frequency   . History of blood transfusion as a child    no abnormal reaction noted  . Diarrhea     since gallbladder removed  . Atrial fibrillation (HCC)   . Presence of permanent cardiac pacemaker   . 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 Cholecystectomy  1971  . Stomach stapled  1985  . Joint replacement Right     knee  . Colonoscopy    . Cataract surgery Bilateral   . Abdominal hysterectomy    . Permanent pacemaker insertion N/A 11/01/2014    Procedure: PERMANENT PACEMAKER INSERTION;  Surgeon:  Marinus Maw, MD;  Location: [facility] CATH LAB;  Service: Cardiovascular;  Laterality: N/A;  . Eye surgery    . Insert / replace / remove pacemaker      Medtronic PPM 11/01/14 (Dr. Lewayne Bunting)     MEDICATIONS:   No prescriptions prior to admission    ALLERGIES:  No Known Allergies  REVIEW OF SYSTEMS:  Pertinent items noted in HPI and remainder of comprehensive ROS otherwise negative.   FAMILY HISTORY:  No family history on file.  SOCIAL HISTORY:   Social History  Substance Use Topics  . Smoking status: Never Smoker   . Smokeless tobacco: Never Used  . Alcohol Use: 0.6 oz/week    1 Glasses of wine per week     EXAMINATION:  Vital signs in last 24 hours:    General appearance: alert, cooperative and no distress Lungs: clear to auscultation bilaterally Heart: regular rate and rhythm, S1, S2 normal, no murmur, click, rub or gallop Abdomen: soft, non-tender; bowel sounds normal; no masses,  no organomegaly Extremities: extremities normal, atraumatic, no cyanosis or edema and Homans sign is negative, no sign of DVT Pulses: 2+ and symmetric Skin: Skin color, texture, turgor normal. No rashes or lesions or normal Neurologic: Alert and oriented X 3, normal strength and tone. Normal symmetric reflexes. Normal coordination and gait  Musculoskeletal:  ROM 0-100, Ligaments intact,  Imaging Review Plain radiographs demonstrate severe  degenerative joint disease of the left knee. The overall alignment is significant varus. The bone quality appears to be good for age and reported activity level.  Assessment/Plan: Primary osteoarthritis, left knee   The patient history, physical examination and imaging studies are consistent with advanced degenerative joint disease of the left knee. The patient has failed conservative treatment.  The clearance notes were reviewed.  After discussion with the patient it was felt that Total Knee Replacement was indicated. The procedure,  risks, and benefits  of total knee arthroplasty were presented and reviewed. The risks including but not limited to aseptic loosening, infection, blood clots, vascular injury, stiffness, patella tracking problems complications among others were discussed. The patient acknowledged the explanation, agreed to proceed with the plan.  Isaia Hassell 11/05/2015, 6:07 AM

## 2015-11-05 NOTE — Evaluation (Signed)
Physical Therapy Evaluation Patient Details Name: Lauren Henry MRN: [id number] DOB: [birth year] Today's Date: 11/05/2015   History of Present Illness  Admitted for L TKA; WBAT, No KI;  has a past medical history of Hyperlipidemia; Hypothyroidism; Hypertension; Arthritis; Joint pain; History of colon polyps; Urinary urgency; Urinary frequency; Diarrhea; Atrial fibrillation (HCC); Presence of permanent cardiac pacemaker; and Dysrhythmia.  has past surgical history that includes ; stomach stapled (1985); Joint replacement (Right);(Bilateral); Abdominal hysterectomy; permanent pacemaker insertion (N/A, 11/01/2014);    Clinical Impression   Pt is s/p TKA resulting in the deficits listed below (see PT Problem List).  Pt will benefit from skilled PT to increase their independence and safety with mobility to allow discharge to the venue listed below.      Follow Up Recommendations Home health PT;Supervision - Intermittent    Equipment Recommendations  Rolling walker with 5" wheels;3in1 (PT) (shower chair)    Recommendations for Other Services       Precautions / Restrictions Precautions Precautions: Knee Precaution Comments: Pt educated to not allow any pillow or bolster under knee for healing with optimal range of motion.  Restrictions Weight Bearing Restrictions: Yes LLE Weight Bearing: Weight bearing as tolerated      Mobility  Bed Mobility Overal bed mobility: Needs Assistance Bed Mobility: Supine to Sit     Supine to sit: Supervision     General bed mobility comments: cues for techniwue  Transfers Overall transfer level: Needs assistance Equipment used: Rolling walker (2 wheeled) Transfers: Sit to/from Stand Sit to Stand: Mod assist         General transfer comment: light mod assist to power up  Ambulation/Gait Ambulation/Gait assistance: Min assist Ambulation Distance (Feet): 100 Feet Assistive device: Rolling walker (2 wheeled) Gait Pattern/deviations: Step-to  pattern     General Gait Details: Cues for gait sequence and to activate L quad for stance stability  Stairs            Wheelchair Mobility    Modified Rankin (Stroke Patients Only)       Balance                                             Pertinent Vitals/Pain Pain Assessment: 0-10 Pain Score: 5  Pain Location: L knee Pain Descriptors / Indicators: Aching Pain Intervention(s): Limited activity within patient's tolerance;Monitored during session    Home Living Family/patient expects to be discharged to:: Private residence Living Arrangements: Spouse/significant other Available Help at Discharge: Family;Available PRN/intermittently Type of Home: House Home Access: Stairs to enter Entrance Stairs-Rails: Left Entrance Stairs-Number of Steps: 3 Home Layout: One level Home Equipment: Walker - 2 wheels;Cane - single point      Prior Function Level of Independence: Independent               Hand Dominance        Extremity/Trunk Assessment   Upper Extremity Assessment: Overall WFL for tasks assessed           Lower Extremity Assessment: LLE deficits/detail   LLE Deficits / Details: grossly decr AROM and strength postop; noted good quad activation and ROM approx 5-85 deg actively; quad lag present with straight leg raise  Cervical / Trunk Assessment: Normal  Communication   Communication: No difficulties  Cognition Arousal/Alertness: Awake/alert Behavior During Therapy: WFL for tasks assessed/performed Overall Cognitive Status: Within Functional Limits for tasks  assessed                      General Comments      Exercises Total Joint Exercises Ankle Circles/Pumps: AROM;Both;5 reps Quad Sets: AROM;Left;20 reps Heel Slides: AROM;Left;10 reps Straight Leg Raises: AROM;Left;10 reps      Assessment/Plan    PT Assessment Patient needs continued PT services  PT Diagnosis Difficulty walking;Acute pain   PT Problem  List Decreased strength;Decreased range of motion;Decreased activity tolerance;Decreased balance;Decreased mobility;Decreased knowledge of use of DME;Pain;Decreased knowledge of precautions  PT Treatment Interventions DME instruction;Gait training;Stair training;Functional mobility training;Therapeutic activities;Therapeutic exercise;Patient/family education   PT Goals (Current goals can be found in the Care Plan section) Acute Rehab PT Goals Patient Stated Goal: wants to be abel to walk safely PT Goal Formulation: With patient Time For Goal Achievement: 11/12/15 Potential to Achieve Goals: Good    Frequency 7X/week   Barriers to discharge        Co-evaluation               End of Session Equipment Utilized During Treatment: Gait belt Activity Tolerance: Patient tolerated treatment well Patient left: in chair;with call bell/phone within reach;with family/visitor present Nurse Communication: Mobility status         Time: 1433-1500 PT Time Calculation (min) (ACUTE ONLY): 27 min   Charges:   PT Evaluation $Initial PT Evaluation Tier I: 1 Procedure PT Treatments $Gait Training: 8-22 mins   PT G CodesOlen Pel:        Roshanna Cimino Hamff 11/05/2015, 4:24 PM  Van ClinesHolly Natasa Stigall, [place]  Acute Rehabilitation Services Pager [phone] Office [phone]

## 2015-11-05 NOTE — Progress Notes (Signed)
Orthopedic Tech Progress Note Patient Details:  Lauren HitchSandra D Henry 01/05/1941 [id number]  CPM Left Knee CPM Left Knee: On Left Knee Flexion (Degrees): 90 Left Knee Extension (Degrees): 0 Additional Comments: Trapeze bar and foot roll   Lauren FordyceJennifer C Alphonsus Doyel 11/05/2015, 12:06 PM

## 2015-11-05 NOTE — Anesthesia Procedure Notes (Signed)
Spinal Patient location during procedure: OR Start time: 11/05/2015 9:05 AM End time: 11/05/2015 9:08 AM Staffing Performed by: anesthesiologist  Preanesthetic Checklist Completed: patient identified, site marked, surgical consent, pre-op evaluation, timeout performed, IV checked, risks and benefits discussed and monitors and equipment checked Spinal Block Patient position: left lateral decubitus Prep: ChloraPrep Patient monitoring: heart rate, cardiac monitor, continuous pulse ox and blood pressure Approach: left paramedian Location: L3-4 Injection technique: single-shot Needle Needle type: Tuohy  Needle gauge: 22 G Needle length: 9 cm Needle insertion depth: 5 cm Assessment Sensory level: T8 Additional Notes 10 mg 0.75% Bupivacaine injected easily

## 2015-11-05 NOTE — Progress Notes (Signed)
Utilization review completed.  

## 2015-11-05 NOTE — Anesthesia Postprocedure Evaluation (Signed)
  Anesthesia Post-op Note  Patient: Lauren Henry  Procedure(s) Performed: Procedure(s): TOTAL KNEE ARTHROPLASTY (Left)  Patient Location: PACU  Anesthesia Type:Spinal Level of Consciousness: awake, alert  and oriented  Airway and Oxygen Therapy: Patient Spontanous Breathing and Patient connected to nasal cannula oxygen  Post-op Pain: none  Post-op Assessment: Post-op Vital signs reviewed, Patient's Cardiovascular Status Stable, Respiratory Function Stable, Patent Airway and Pain level controlled LLE Motor Response: Purposeful movement LLE Sensation: Pain RLE Motor Response: Purposeful movement RLE Sensation: Full sensation L Sensory Level: S1-Sole of foot, small toes R Sensory Level: S1-Sole of foot, small toes  Post-op Vital Signs: stable  Last Vitals:  Filed Vitals:   11/05/15 1226  BP: 178/98  Pulse: 63  Temp: 36.5 C  Resp: 15    Complications: No apparent anesthesia complications

## 2015-11-06 ENCOUNTER — Encounter (HOSPITAL_COMMUNITY): Payer: Self-pay | Admitting: Orthopedic Surgery

## 2015-11-06 ENCOUNTER — Encounter: Payer: Medicare PPO | Admitting: Internal Medicine

## 2015-11-06 LAB — CBC
HEMATOCRIT: 31.6 % — AB (ref 36.0–46.0)
HEMOGLOBIN: 10.2 g/dL — AB (ref 12.0–15.0)
MCH: 30.4 pg (ref 26.0–34.0)
MCHC: 32.3 g/dL (ref 30.0–36.0)
MCV: 94.3 fL (ref 78.0–100.0)
Platelets: 135 10*3/uL — ABNORMAL LOW (ref 150–400)
RBC: 3.35 MIL/uL — AB (ref 3.87–5.11)
RDW: 13.7 % (ref 11.5–15.5)
WBC: 6 10*3/uL (ref 4.0–10.5)

## 2015-11-06 LAB — BASIC METABOLIC PANEL
Anion gap: 6 (ref 5–15)
BUN: 15 mg/dL (ref 6–20)
CHLORIDE: 107 mmol/L (ref 101–111)
CO2: 25 mmol/L (ref 22–32)
CREATININE: 1 mg/dL (ref 0.44–1.00)
Calcium: 8.7 mg/dL — ABNORMAL LOW (ref 8.9–10.3)
GFR calc non Af Amer: 54 mL/min — ABNORMAL LOW (ref >60)
Glucose, Bld: 119 mg/dL — ABNORMAL HIGH (ref 65–99)
POTASSIUM: 4.2 mmol/L (ref 3.5–5.1)
Sodium: 138 mmol/L (ref 135–145)

## 2015-11-06 MED ORDER — ONDANSETRON HCL 4 MG PO TABS: 4.0000 mg | ORAL_TABLET | Freq: Three times a day (TID) | ORAL | Status: DC | PRN

## 2015-11-06 MED ORDER — OXYCODONE HCL 5 MG PO TABS: 5.0000 mg | ORAL_TABLET | ORAL | Status: DC | PRN

## 2015-11-06 MED ORDER — APIXABAN 5 MG PO TABS
5.0000 mg | ORAL_TABLET | Freq: Two times a day (BID) | ORAL | Status: DC
  Administered 2015-11-06: 5 mg via ORAL
  Filled 2015-11-06: qty 1

## 2015-11-06 MED ORDER — APIXABAN 5 MG PO TABS: 5.0000 mg | ORAL_TABLET | Freq: Two times a day (BID) | ORAL | Status: DC

## 2015-11-06 MED ORDER — METHOCARBAMOL 500 MG PO TABS: 500.0000 mg | ORAL_TABLET | Freq: Four times a day (QID) | ORAL | Status: DC | PRN

## 2015-11-06 MED ORDER — OXYCODONE HCL 10 MG PO TABS: 10.0000 mg | ORAL_TABLET | Freq: Two times a day (BID) | ORAL | Status: DC

## 2015-11-06 NOTE — Progress Notes (Signed)
Physical Therapy Treatment Patient Details Name: Lauren HitchSandra D Henry MRN: [id number] DOB: 10/12/1941 Today's Date: 11/06/2015    History of Present Illness Admitted for L TKA; WBAT, No KI;  has a past medical history of Hyperlipidemia; Hypothyroidism; Hypertension; Arthritis; Joint pain; History of colon polyps; Urinary urgency; Urinary frequency; Diarrhea; Atrial fibrillation (HCC); Presence of permanent cardiac pacemaker; and Dysrhythmia.  has past surgical history that includes ; stomach stapled (1985); Joint replacement (Right);(Bilateral); Abdominal hysterectomy; permanent pacemaker insertion (N/A, 11/01/2014);      PT Comments    Making very good progress with mobility, amb distance, and stair training; Knee was more painful today, but Lauren Henry was able to progress Range of motion in her knee as well; on track for dc later today from PT standpoint  Follow Up Recommendations  Home health PT;Supervision - Intermittent     Equipment Recommendations  Rolling walker with 5" wheels;3in1 (PT) (shower chair)    Recommendations for Other Services       Precautions / Restrictions Precautions Precautions: Knee Precaution Comments: Pt educated to not allow any pillow or bolster under knee for healing with optimal range of motion.  Restrictions Weight Bearing Restrictions: Yes LLE Weight Bearing: Weight bearing as tolerated    Mobility  Bed Mobility Overal bed mobility: Needs Assistance Bed Mobility: Supine to Sit     Supine to sit: Supervision     General bed mobility comments: cues for technique  Transfers Overall transfer level: Needs assistance Equipment used: Rolling walker (2 wheeled) Transfers: Sit to/from Stand Sit to Stand: Min assist         General transfer comment: Cues for technique, safety and hand palcement  Ambulation/Gait Ambulation/Gait assistance: Supervision Ambulation Distance (Feet): 200 Feet Assistive device: Rolling walker (2 wheeled) Gait  Pattern/deviations: Step-through pattern (emerging)     General Gait Details: Cues for gait sequence and to activate L quad for stance stability; Cues also to self-monitor for activity tolerance as she reported dizziness and some nausea   Stairs Stairs: Yes Stairs assistance: Min guard Stair Management: One rail Left;Step to pattern;Forwards Number of Stairs: 3 General stair comments: verbal and demo cues for sequence and technique; overall managing well  Wheelchair Mobility    Modified Rankin (Stroke Patients Only)       Balance Overall balance assessment: Needs assistance         Standing balance support: Bilateral upper extremity supported Standing balance-Leahy Scale: Poor Standing balance comment: RW for support                    Cognition Arousal/Alertness: Awake/alert Behavior During Therapy: WFL for tasks assessed/performed Overall Cognitive Status: Within Functional Limits for tasks assessed                      Exercises Total Joint Exercises Ankle Circles/Pumps: AROM;Both;10 reps Quad Sets: AROM;Left;20 reps Heel Slides: AROM;Left;10 reps Straight Leg Raises: AROM;Left;10 reps Goniometric ROM: 5-90    General Comments General comments (skin integrity, edema, etc.): No family present for OT eval. Pt with nausea at beginning of session. Pt with emesis during session but stated that she felt better and wanted to continue with therapy session; RN notified.      Pertinent Vitals/Pain Pain Assessment: 0-10 Pain Score: 2  Pain Location: L knee Pain Descriptors / Indicators: Aching;Sore Pain Intervention(s): Monitored during session;Premedicated before session    Home Living Family/patient expects to be discharged to:: Private residence Living Arrangements: Spouse/significant other Available Help at  Discharge: Family;Available PRN/intermittently Type of Home: House Home Access: Stairs to enter Entrance Stairs-Rails: Left Home Layout:  One level Home Equipment: Walker - 2 wheels;Cane - single point      Prior Function Level of Independence: Independent          PT Goals (current goals can now be found in the care plan section) Acute Rehab PT Goals Patient Stated Goal: wants to be abel to walk safely PT Goal Formulation: With patient Time For Goal Achievement: 11/12/15 Potential to Achieve Goals: Good Progress towards PT goals: Progressing toward goals    Frequency  7X/week    PT Plan Current plan remains appropriate    Co-evaluation             End of Session Equipment Utilized During Treatment: Gait belt Activity Tolerance: Patient tolerated treatment well Patient left: in chair;with call bell/phone within reach;with family/visitor present     Time: 0935-1010 PT Time Calculation (min) (ACUTE ONLY): 35 min  Charges:  $Gait Training: 23-37 mins                    G Codes:      Olen Pel 11/06/2015, 12:40 PM  Van Clines, [employer]  Acute Rehabilitation Services Pager [phone] Office [phone]

## 2015-11-06 NOTE — Evaluation (Addendum)
Occupational Therapy Evaluation Patient Details Name: Lauren Henry MRN: [id number] DOB: Jan 18, 1941 Today's Date: 11/06/2015    History of Present Illness Admitted for L TKA; WBAT, No KI;  has a past medical history of Hyperlipidemia; Hypothyroidism; Hypertension; Arthritis; Joint pain; History of colon polyps; Urinary urgency; Urinary frequency; Diarrhea; Atrial fibrillation (HCC); Presence of permanent cardiac pacemaker; and Dysrhythmia.  has past surgical history that includes ; stomach stapled (1985); Joint replacement (Right);(Bilateral); Abdominal hysterectomy; permanent pacemaker insertion (N/A, 11/01/2014);     Clinical Impression   Pt reports she was independent with ADLs PTA. Currently pt is overall min guard for ADLs and mobility with the exception of min A for LB ADLs. Pt with nausea with emesis during OT session; RN notified. All education completed; pt with no further questions or concerns for OT at this time. Pt planning to d/c home with intermittent supervision from family. Pt ready to d/c from an OT standpoint; signing off at this time. Thank you for this referral.     Follow Up Recommendations  No OT follow up;Supervision - Intermittent    Equipment Recommendations  3 in 1 bedside comode    Recommendations for Other Services       Precautions / Restrictions Precautions Precautions: Knee Restrictions Weight Bearing Restrictions: Yes LLE Weight Bearing: Weight bearing as tolerated      Mobility Bed Mobility               General bed mobility comments: OOB in chair  Transfers Overall transfer level: Needs assistance Equipment used: Rolling walker (2 wheeled) Transfers: Sit to/from Stand Sit to Stand: Min guard         General transfer comment: Min guard for safety, increased time required for sit to stand. Sit to stand from chair x 1. Good hand placement     Balance Overall balance assessment: Needs assistance         Standing balance  support: Bilateral upper extremity supported Standing balance-Leahy Scale: Poor Standing balance comment: RW for support                            ADL Overall ADL's : Needs assistance/impaired Eating/Feeding: Set up;Sitting   Grooming: Min guard;Standing       Lower Body Bathing: Minimal assistance;Sit to/from stand       Lower Body Dressing: Minimal assistance;Sit to/from stand Lower Body Dressing Details (indicate cue type and reason): Educated on compensatory strategies for LB ADLs; pt verbalized understanding.  Toilet Transfer: Min guard;Ambulation;BSC;RW (BSC over toilet)       Tub/ Shower Transfer: Min guard;Ambulation;Rolling walker Tub/Shower Transfer Details (indicate cue type and reason): Educated and demonstrated walk in shower transfer technique; pt return demonstrated understanding.  Functional mobility during ADLs: Min guard;Rolling walker       Vision     Perception     Praxis      Pertinent Vitals/Pain Pain Assessment: 0-10 Pain Score: 4  Pain Location: L knee Pain Descriptors / Indicators: Sore Pain Intervention(s): Limited activity within patient's tolerance;Monitored during session;Repositioned;Ice applied     Hand Dominance     Extremity/Trunk Assessment Upper Extremity Assessment Upper Extremity Assessment: Overall WFL for tasks assessed   Lower Extremity Assessment Lower Extremity Assessment: Defer to PT evaluation   Cervical / Trunk Assessment Cervical / Trunk Assessment: Normal   Communication Communication Communication: No difficulties   Cognition Arousal/Alertness: Awake/alert Behavior During Therapy: WFL for tasks assessed/performed Overall Cognitive Status: Within Functional  Limits for tasks assessed                     General Comments       Exercises       Shoulder Instructions      Home Living Family/patient expects to be discharged to:: Private residence Living Arrangements:  Spouse/significant other Available Help at Discharge: Family;Available PRN/intermittently Type of Home: House Home Access: Stairs to enter Enter[employer] CorporationEntrance Stairs-Number of Steps: 3 Entrance Stairs-Rails: Left Home Layout: One level     Bathroom Shower/Tub: Producer, television/film/videoWalk-in shower   Bathroom Toilet: Handicapped height Bathroom Accessibility: Yes How Accessible: Accessible via walker Home Equipment: Walker - 2 wheels;Cane - single point          Prior Functioning/Environment Level of Independence: Independent             OT Diagnosis: Acute pain   OT Problem List:     OT Treatment/Interventions:      OT Goals(Current goals can be found in the care plan section) Acute Rehab OT Goals Patient Stated Goal: to go home when ready OT Goal Formulation: With patient  OT Frequency:     Barriers to D/C:            Co-evaluation              End of Session Equipment Utilized During Treatment: Gait belt;Rolling walker CPM Left Knee CPM Left Knee: Off Nurse Communication: Other (comment) (pt with emesis during session)  Activity Tolerance: Patient tolerated treatment well;Other (comment) (Limited by nausea with emesis ) Patient left: in chair;with call bell/phone within reach (zero degree bone foam applied to LLE)   Time: 4010-27251102-1118 OT Time Calculation (min): 16 min Charges:  OT General Charges $OT Visit: 1 Procedure OT Evaluation $Initial OT Evaluation Tier I: 1 Procedure G-Codes:     Gaye AlkenBailey A Jailin Manocchio M.S., OTR/L Pager: [phone]  11/06/2015, 11:27 AM

## 2015-11-06 NOTE — Op Note (Signed)
TOTAL KNEE REPLACEMENT OPERATIVE NOTE:  11/05/2015  4:47 PM  PATIENT:  Lauren Henry  74 y.o. female  PRE-OPERATIVE DIAGNOSIS:  primary osteoarthritis left knee  POST-OPERATIVE DIAGNOSIS:  primary osteoarthritis left knee  PROCEDURE:  Procedure(s): TOTAL KNEE ARTHROPLASTY  SURGEON:  Surgeon(s): Dannielle Huh, MD  PHYSICIAN ASSISTANT: Altamese Cabal, [facility]  ANESTHESIA:   spinal  DRAINS: Hemovac  SPECIMEN: None  COUNTS:  Correct  TOURNIQUET:   Total Tourniquet Time Documented: Thigh (Left) - 55 minutes Total: Thigh (Left) - 55 minutes   DICTATION:  Indication for procedure:    The patient is a 74 y.o. female who has failed conservative treatment for primary osteoarthritis left knee.  Informed consent was obtained prior to anesthesia. The risks versus benefits of the operation were explain and in a way the patient can, and did, understand.   On the implant demand matching protocol, this patient scored 10.  Therefore, this patient did" "did not receive a polyethylene insert with vitamin E which is a high demand implant.  Description of procedure:     The patient was taken to the operating room and placed under anesthesia.  The patient was positioned in the usual fashion taking care that all body parts were adequately padded and/or protected.  I foley catheter was not placed.  A tourniquet was applied and the leg prepped and draped in the usual sterile fashion.  The extremity was exsanguinated with the esmarch and tourniquet inflated to 350 mmHg.  Pre-operative range of motion was normal.  The knee was in 6 degree of mild varus.  A midline incision approximately 6-7 inches long was made with a #10 blade.  A new blade was used to make a parapatellar arthrotomy going 2-3 cm into the quadriceps tendon, over the patella, and alongside the medial aspect of the patellar tendon.  A synovectomy was then performed with the #10 blade and forceps. I then elevated the deep MCL off the medial  tibial metaphysis subperiosteally around to the semimembranosus attachment.    I everted the patella and used calipers to measure patellar thickness.  I used the reamer to ream down to appropriate thickness to recreate the native thickness.  I then removed excess bone with the rongeur and sagittal saw.  I used the appropriately sized template and drilled the three lug holes.  I then put the trial in place and measured the thickness with the calipers to ensure recreation of the native thickness.  The trial was then removed and the patella subluxed and the knee brought into flexion.  A homan retractor was place to retract and protect the patella and lateral structures.  A Z-retractor was place medially to protect the medial structures.  The extra-medullary alignment system was used to make cut the tibial articular surface perpendicular to the anamotic axis of the tibia and in 3 degrees of posterior slope.  The cut surface and alignment jig was removed.  I then used the intramedullary alignment guide to make a 6 valgus cut on the distal femur.  I then marked out the epicondylar axis on the distal femur.  The posterior condylar axis measured 3 degrees.  I then used the anterior referencing sizer and measured the femur to be a size 8.  The 4-In-1 cutting block was screwed into place in external rotation matching the posterior condylar angle, making our cuts perpendicular to the epicondylar axis.  Anterior, posterior and chamfer cuts were made with the sagittal saw.  The cutting block and cut pieces  were removed.  A lamina spreader was placed in 90 degrees of flexion.  The ACL, PCL, menisci, and posterior condylar osteophytes were removed.  A 11 mm spacer blocked was found to offer good flexion and extension gap balance after mild in degree releasing.   The scoop retractor was then placed and the femoral finishing block was pinned in place.  The small sagittal saw was used as well as the lug drill to finish the  femur.  The block and cut surfaces were removed and the medullary canal hole filled with autograft bone from the cut pieces.  The tibia was delivered forward in deep flexion and external rotation.  A size D tray was selected and pinned into place centered on the medial 1/3 of the tibial tubercle.  The reamer and keel was used to prepare the tibia through the tray.    I then trialed with the size 8 femur, size D tibia, a 11 mm insert and the 32 patella.  I had excellent flexion/extension gap balance, excellent patella tracking.  Flexion was full and beyond 120 degrees; extension was zero.  These components were chosen and the staff opened them to me on the back table while the knee was lavaged copiously and the cement mixed.  The soft tissue was infiltrated with 60cc of exparel 1.3% through a 21 gauge needle.  I cemented in the components and removed all excess cement.  The polyethylene tibial component was snapped into place and the knee placed in extension while cement was hardening.  The capsule was infilltrated with 30cc of .25% Marcaine with epinephrine.  A hemovac was place in the joint exiting superolaterally.  A pain pump was place superomedially superficial to the arthrotomy.  Once the cement was hard, the tourniquet was let down.  Hemostasis was obtained.  The arthrotomy was closed with figure-8 #1 vicryl sutures.  The deep soft tissues were closed with #0 vicryls and the subcuticular layer closed with a running #2-0 vicryl.  The skin was reapproximated and closed with skin staples.  The wound was dressed with xeroform, 4 x4's, 2 ABD sponges, a single layer of webril and a TED stocking.   The patient was then awakened, extubated, and taken to the recovery room in stable condition.  BLOOD LOSS:  300cc DRAINS: 1 hemovac, 1 pain catheter COMPLICATIONS:  None.  PLAN OF CARE: Admit to inpatient   PATIENT DISPOSITION:  PACU - hemodynamically stable.   Delay start of Pharmacological VTE agent  (>24hrs) due to surgical blood loss or risk of bleeding:  not applicable  Please fax a copy of this op note to my office at [phone] (please only include page 1 and 2 of the Case Information op note)

## 2015-11-06 NOTE — Progress Notes (Signed)
SPORTS MEDICINE AND JOINT REPLACEMENT  Lauren SpurlingStephen Lucey, MD   Lauren CabalMaurice Samaiya Awadallah, PA-C [address], [place], [place]  [id number]                             [phone]   PROGRESS NOTE  Subjective:  negative for Chest Pain  negative for Shortness of Breath  negative for Nausea/Vomiting   negative for Calf Pain  negative for Bowel Movement   Tolerating Diet: yes         Patient reports pain as 5 on 0-10 scale.    Objective: Vital signs in last 24 hours:   Patient Vitals for the past 24 hrs:  BP Temp Temp src Pulse Resp SpO2 Height Weight  11/06/15 0444 (!) 146/48 mmHg 98.1 F (36.7 C) Oral 66 17 97 % - -  11/06/15 0027 (!) 130/58 mmHg 99.1 F (37.3 C) Oral 74 16 92 % - -  11/05/15 2007 (!) 127/59 mmHg 98.2 F (36.8 C) Oral 66 - 94 % - -  11/05/15 1226 (!) 178/98 mmHg 97.7 F (36.5 C) Oral 63 15 99 % - -  11/05/15 1220 - - - - - - 5\' 4"  (1.626 m) 85.548 kg (188 lb 9.6 oz)  11/05/15 1200 (!) 150/61 mmHg 97.4 F (36.3 C) - - - - - -  11/05/15 1155 (!) 146/52 mmHg - - (!) 59 12 97 % - -  11/05/15 1140 136/68 mmHg - - (!) 59 13 100 % - -  11/05/15 1125 (!) 148/80 mmHg - - (!) 59 13 99 % - -  11/05/15 1110 (!) 134/91 mmHg 97.6 F (36.4 C) - (!) 52 12 95 % - -    @flow {1959:LAST@   Intake/Output from previous day:   11/14 0701 - 11/15 0700 In: 3305 [P.O.:280; I.V.:2975] Out: 600 [Urine:500]   Intake/Output this shift:       Intake/Output      11/14 0701 - 11/15 0700 11/15 0701 - 11/16 0700   P.O. 280    I.V. (mL/kg) 2975 (34.8)    IV Piggyback 50    Total Intake(mL/kg) 3305 (38.6)    Urine (mL/kg/hr) 500 (0.2)    Blood 100 (0)    Total Output 600     Net +2705          Urine Occurrence 2 x       LABORATORY DATA:  Recent Labs  11/06/15 0540  WBC 6.0  HGB 10.2*  HCT 31.6*  PLT 135*    Recent Labs  11/06/15 0540  NA 138  K 4.2  CL 107  CO2 25  BUN 15  CREATININE 1.00  GLUCOSE 119*  CALCIUM 8.7*   Lab Results  Component Value Date   INR  1.26 10/26/2015   INR 1.12 11/01/2014   INR 1.15 10/30/2014    Examination:  General appearance: alert, cooperative and no distress Extremities: Homans sign is negative, no sign of DVT  Wound Exam: clean, dry, intact   Drainage:  None: wound tissue dry  Motor Exam: Quadriceps and Hamstrings Intact  Sensory Exam: Deep Peroneal normal   Assessment:    1 Day Post-Op  Procedure(s) (LRB): TOTAL KNEE ARTHROPLASTY (Left)  ADDITIONAL DIAGNOSIS:  Active Problems:   DJD (degenerative joint disease) of knee  Acute Blood Loss Anemia   Plan: Physical Therapy as ordered Weight Bearing as Tolerated (WBAT)  DVT Prophylaxis: Eliquis  DISCHARGE PLAN: Home  DISCHARGE NEEDS:  HHPT, CPM, Walker and 3-in-1 comode seat         Lauren Henry 11/06/2015, 7:46 AM

## 2015-11-06 NOTE — Discharge Instructions (Signed)
INSTRUCTIONS AFTER JOINT REPLACEMENT  ° °o Remove items at home which could result in a fall. This includes throw rugs or furniture in walking pathways °o ICE to the affected joint every three hours while awake for 30 minutes at a time, for at least the first 3-5 days, and then as needed for pain and swelling.  Continue to use ice for pain and swelling. You may notice swelling that will progress down to the foot and ankle.  This is normal after surgery.  Elevate your leg when you are not up walking on it.   °o Continue to use the breathing machine you got in the hospital (incentive spirometer) which will help keep your temperature down.  It is common for your temperature to cycle up and down following surgery, especially at night when you are not up moving around and exerting yourself.  The breathing machine keeps your lungs expanded and your temperature down. ° ° °DIET:  As you were doing prior to hospitalization, we recommend a well-balanced diet. ° °DRESSING / WOUND CARE / SHOWERING ° °You may change your dressing 3-5 days after surgery.  Then change the dressing every day with sterile gauze.  Please use good hand washing techniques before changing the dressing.  Do not use any lotions or creams on the incision until instructed by your surgeon. ° °ACTIVITY ° °o Increase activity slowly as tolerated, but follow the weight bearing instructions below.   °o No driving for 6 weeks or until further direction given by your physician.  You cannot drive while taking narcotics.  °o No lifting or carrying greater than 10 lbs. until further directed by your surgeon. °o Avoid periods of inactivity such as sitting longer than an hour when not asleep. This helps prevent blood clots.  °o You may return to work once you are authorized by your doctor.  ° ° ° °WEIGHT BEARING  ° °Weight bearing as tolerated with assist device (walker, cane, etc) as directed, use it as long as suggested by your surgeon or therapist, typically at  least 4-6 weeks. ° ° °EXERCISES ° °Results after joint replacement surgery are often greatly improved when you follow the exercise, range of motion and muscle strengthening exercises prescribed by your doctor. Safety measures are also important to protect the joint from further injury. Any time any of these exercises cause you to have increased pain or swelling, decrease what you are doing until you are comfortable again and then slowly increase them. If you have problems or questions, call your caregiver or physical therapist for advice.  ° °Rehabilitation is important following a joint replacement. After just a few days of immobilization, the muscles of the leg can become weakened and shrink (atrophy).  These exercises are designed to build up the tone and strength of the thigh and leg muscles and to improve motion. Often times heat used for twenty to thirty minutes before working out will loosen up your tissues and help with improving the range of motion but do not use heat for the first two weeks following surgery (sometimes heat can increase post-operative swelling).  ° °These exercises can be done on a training (exercise) mat, on the floor, on a table or on a bed. Use whatever works the best and is most comfortable for you.    Use music or television while you are exercising so that the exercises are a pleasant break in your day. This will make your life better with the exercises acting as a break   in your routine that you can look forward to.   Perform all exercises about fifteen times, three times per day or as directed.  You should exercise both the operative leg and the other leg as well. ° °Exercises include: °  °• Quad Sets - Tighten up the muscle on the front of the thigh (Quad) and hold for 5-10 seconds.   °• Straight Leg Raises - With your knee straight (if you were given a brace, keep it on), lift the leg to 60 degrees, hold for 3 seconds, and slowly lower the leg.  Perform this exercise against  resistance later as your leg gets stronger.  °• Leg Slides: Lying on your back, slowly slide your foot toward your buttocks, bending your knee up off the floor (only go as far as is comfortable). Then slowly slide your foot back down until your leg is flat on the floor again.  °• Angel Wings: Lying on your back spread your legs to the side as far apart as you can without causing discomfort.  °• Hamstring Strength:  Lying on your back, push your heel against the floor with your leg straight by tightening up the muscles of your buttocks.  Repeat, but this time bend your knee to a comfortable angle, and push your heel against the floor.  You may put a pillow under the heel to make it more comfortable if necessary.  ° °A rehabilitation program following joint replacement surgery can speed recovery and prevent re-injury in the future due to weakened muscles. Contact your doctor or a physical therapist for more information on knee rehabilitation.  ° ° °CONSTIPATION ° °Constipation is defined medically as fewer than three stools per week and severe constipation as less than one stool per week.  Even if you have a regular bowel pattern at home, your normal regimen is likely to be disrupted due to multiple reasons following surgery.  Combination of anesthesia, postoperative narcotics, change in appetite and fluid intake all can affect your bowels.  ° °YOU MUST use at least one of the following options; they are listed in order of increasing strength to get the job done.  They are all available over the counter, and you may need to use some, POSSIBLY even all of these options:   ° °Drink plenty of fluids (prune juice may be helpful) and high fiber foods °Colace 100 mg by mouth twice a day  °Senokot for constipation as directed and as needed Dulcolax (bisacodyl), take with full glass of water  °Miralax (polyethylene glycol) once or twice a day as needed. ° °If you have tried all these things and are unable to have a bowel  movement in the first 3-4 days after surgery call either your surgeon or your primary doctor.   ° °If you experience loose stools or diarrhea, hold the medications until you stool forms back up.  If your symptoms do not get better within 1 week or if they get worse, check with your doctor.  If you experience "the worst abdominal pain ever" or develop nausea or vomiting, please contact the office immediately for further recommendations for treatment. ° ° °ITCHING:  If you experience itching with your medications, try taking only a single pain pill, or even half a pain pill at a time.  You can also use Benadryl over the counter for itching or also to help with sleep.  ° °TED HOSE STOCKINGS:  Use stockings on both legs until for at least 2 weeks or as   directed by physician office. They may be removed at night for sleeping.  MEDICATIONS:  See your medication summary on the After Visit Summary that nursing will review with you.  You may have some home medications which will be placed on hold until you complete the course of blood thinner medication.  It is important for you to complete the blood thinner medication as prescribed.  PRECAUTIONS:  If you experience chest pain or shortness of breath - call 911 immediately for transfer to the hospital emergency department.   If you develop a fever greater that 101 F, purulent drainage from wound, increased redness or drainage from wound, foul odor from the wound/dressing, or calf pain - CONTACT YOUR SURGEON.                                                   FOLLOW-UP APPOINTMENTS:  If you do not already have a post-op appointment, please call the office for an appointment to be seen by your surgeon.  Guidelines for how soon to be seen are listed in your After Visit Summary, but are typically between 1-4 weeks after surgery.  OTHER INSTRUCTIONS:   Knee Replacement:  Do not place pillow under knee, focus on keeping the knee straight while resting. CPM  instructions: 0-90 degrees, 2 hours in the morning, 2 hours in the afternoon, and 2 hours in the evening. Place foam block, curve side up under heel at all times except when in CPM or when walking.  DO NOT modify, tear, cut, or change the foam block in any way.  MAKE SURE YOU:   Understand these instructions.   Get help right away if you are not doing well or get worse.    Thank you for letting us be a part of your medical care team.  It is a privilege we respect greatly.  We hope these instructions will help you stay on track for a fast and full recovery!   Information on my medicine - ELIQUIS (apixaban)  This medication education was reviewed with me or my healthcare representative as part of my discharge preparation.  The pharmacist that spoke with me during my hospital stay was:  Sheron NightingaleJames A Dorsie Burich, [facility]  Why was Eliquis prescribed for you? Eliquis was prescribed for you to reduce the risk of a blood clot forming that can cause a stroke if you have a medical condition called atrial fibrillation (a type of irregular heartbeat).  What do You need to know about Eliquis ? Take your Eliquis TWICE DAILY - one tablet in the morning and one tablet in the evening with or without food. If you have difficulty swallowing the tablet whole please discuss with your pharmacist how to take the medication safely.  Take Eliquis exactly as prescribed by your doctor and DO NOT stop taking Eliquis without talking to the doctor who prescribed the medication.  Stopping may increase your risk of developing a stroke.  Refill your prescription before you run out.  After discharge, you should have regular check-up appointments with your healthcare provider that is prescribing your Eliquis.  In the future your dose may need to be changed if your kidney function or weight changes by a significant amount or as you get older.  What do you do if you miss a dose? If you miss a dose, take it as soon  as you remember  on the same day and resume taking twice daily.  Do not take more than one dose of ELIQUIS at the same time to make up a missed dose.  Important Safety Information A possible side effect of Eliquis is bleeding. You should call your healthcare provider right away if you experience any of the following: ? Bleeding from an injury or your nose that does not stop. ? Unusual colored urine (red or dark brown) or unusual colored stools (red or black). ? Unusual bruising for unknown reasons. ? A serious fall or if you hit your head (even if there is no bleeding).  Some medicines may interact with Eliquis and might increase your risk of bleeding or clotting while on Eliquis. To help avoid this, consult your healthcare provider or pharmacist prior to using any new prescription or non-prescription medications, including herbals, vitamins, non-steroidal anti-inflammatory drugs (NSAIDs) and supplements.  This website has more information on Eliquis (apixaban): http://www.eliquis.com/eliquis/home

## 2015-11-06 NOTE — Care Management Note (Signed)
Case Management Note  Patient Details  Name: Lauren Henry MRN: [id number] Date of Birth: 04/05/1941  Subjective/Objective:    74 yr old female s/p left total knee arthroplasty.                 Action/Plan: Case manager spoke with patient concerning home health and DME needs. [facility], no changes. Patient states rolling walker, 3in1 and CPM have been delivered to her home. She will have family support at discharge. No further case management needs identified.   Expected Discharge Date:  11/06/15               Expected Discharge Plan:   home with Home Health  In-House Referral:     Discharge planning Services  CM Consult  Post Acute Care Choice:  Durable Medical Equipment, Home Health Choice offered to:     DME Arranged:  3-N-1, Walker rolling, CPM DME Agency:  Other - Comment ([facility])  HH Arranged:  PT [facility]  Status of Service:  Completed, signed off  Medicare Important Message Given:    Date Medicare IM Given:    Medicare IM give by:    Date Additional Medicare IM Given:    Additional Medicare Important Message give by:     If discussed at Long Length of Stay Meetings, dates discussed:    Additional Comments:  Durenda GuthrieBrady, Aram Domzalski Naomi, RN 11/06/2015, 11:51 AM

## 2015-11-07 DIAGNOSIS — I10 Essential (primary) hypertension: Secondary | ICD-10-CM | POA: Diagnosis not present

## 2015-11-07 DIAGNOSIS — I442 Atrioventricular block, complete: Secondary | ICD-10-CM | POA: Diagnosis not present

## 2015-11-07 DIAGNOSIS — M1990 Unspecified osteoarthritis, unspecified site: Secondary | ICD-10-CM | POA: Diagnosis not present

## 2015-11-07 DIAGNOSIS — Z471 Aftercare following joint replacement surgery: Secondary | ICD-10-CM | POA: Diagnosis not present

## 2015-11-07 DIAGNOSIS — I4891 Unspecified atrial fibrillation: Secondary | ICD-10-CM | POA: Diagnosis not present

## 2015-11-07 DIAGNOSIS — Z96653 Presence of artificial knee joint, bilateral: Secondary | ICD-10-CM | POA: Diagnosis not present

## 2015-11-10 DIAGNOSIS — M1990 Unspecified osteoarthritis, unspecified site: Secondary | ICD-10-CM | POA: Diagnosis not present

## 2015-11-10 DIAGNOSIS — Z96653 Presence of artificial knee joint, bilateral: Secondary | ICD-10-CM | POA: Diagnosis not present

## 2015-11-10 DIAGNOSIS — I442 Atrioventricular block, complete: Secondary | ICD-10-CM | POA: Diagnosis not present

## 2015-11-10 DIAGNOSIS — Z471 Aftercare following joint replacement surgery: Secondary | ICD-10-CM | POA: Diagnosis not present

## 2015-11-10 DIAGNOSIS — I4891 Unspecified atrial fibrillation: Secondary | ICD-10-CM | POA: Diagnosis not present

## 2015-11-10 DIAGNOSIS — I10 Essential (primary) hypertension: Secondary | ICD-10-CM | POA: Diagnosis not present

## 2015-11-12 DIAGNOSIS — I4891 Unspecified atrial fibrillation: Secondary | ICD-10-CM | POA: Diagnosis not present

## 2015-11-12 DIAGNOSIS — M1990 Unspecified osteoarthritis, unspecified site: Secondary | ICD-10-CM | POA: Diagnosis not present

## 2015-11-12 DIAGNOSIS — I10 Essential (primary) hypertension: Secondary | ICD-10-CM | POA: Diagnosis not present

## 2015-11-12 DIAGNOSIS — Z471 Aftercare following joint replacement surgery: Secondary | ICD-10-CM | POA: Diagnosis not present

## 2015-11-12 DIAGNOSIS — I442 Atrioventricular block, complete: Secondary | ICD-10-CM | POA: Diagnosis not present

## 2015-11-12 DIAGNOSIS — Z96653 Presence of artificial knee joint, bilateral: Secondary | ICD-10-CM | POA: Diagnosis not present

## 2015-11-13 DIAGNOSIS — M1990 Unspecified osteoarthritis, unspecified site: Secondary | ICD-10-CM | POA: Diagnosis not present

## 2015-11-13 DIAGNOSIS — I4891 Unspecified atrial fibrillation: Secondary | ICD-10-CM | POA: Diagnosis not present

## 2015-11-13 DIAGNOSIS — Z96653 Presence of artificial knee joint, bilateral: Secondary | ICD-10-CM | POA: Diagnosis not present

## 2015-11-13 DIAGNOSIS — I10 Essential (primary) hypertension: Secondary | ICD-10-CM | POA: Diagnosis not present

## 2015-11-13 DIAGNOSIS — I442 Atrioventricular block, complete: Secondary | ICD-10-CM | POA: Diagnosis not present

## 2015-11-13 DIAGNOSIS — Z471 Aftercare following joint replacement surgery: Secondary | ICD-10-CM | POA: Diagnosis not present

## 2015-11-14 DIAGNOSIS — I4891 Unspecified atrial fibrillation: Secondary | ICD-10-CM | POA: Diagnosis not present

## 2015-11-14 DIAGNOSIS — I10 Essential (primary) hypertension: Secondary | ICD-10-CM | POA: Diagnosis not present

## 2015-11-14 DIAGNOSIS — I442 Atrioventricular block, complete: Secondary | ICD-10-CM | POA: Diagnosis not present

## 2015-11-14 DIAGNOSIS — M1990 Unspecified osteoarthritis, unspecified site: Secondary | ICD-10-CM | POA: Diagnosis not present

## 2015-11-14 DIAGNOSIS — Z96653 Presence of artificial knee joint, bilateral: Secondary | ICD-10-CM | POA: Diagnosis not present

## 2015-11-14 DIAGNOSIS — Z471 Aftercare following joint replacement surgery: Secondary | ICD-10-CM | POA: Diagnosis not present

## 2015-11-16 DIAGNOSIS — Z96653 Presence of artificial knee joint, bilateral: Secondary | ICD-10-CM | POA: Diagnosis not present

## 2015-11-16 DIAGNOSIS — I4891 Unspecified atrial fibrillation: Secondary | ICD-10-CM | POA: Diagnosis not present

## 2015-11-16 DIAGNOSIS — I442 Atrioventricular block, complete: Secondary | ICD-10-CM | POA: Diagnosis not present

## 2015-11-16 DIAGNOSIS — M1990 Unspecified osteoarthritis, unspecified site: Secondary | ICD-10-CM | POA: Diagnosis not present

## 2015-11-16 DIAGNOSIS — I10 Essential (primary) hypertension: Secondary | ICD-10-CM | POA: Diagnosis not present

## 2015-11-16 DIAGNOSIS — Z471 Aftercare following joint replacement surgery: Secondary | ICD-10-CM | POA: Diagnosis not present

## 2015-11-19 DIAGNOSIS — I4891 Unspecified atrial fibrillation: Secondary | ICD-10-CM | POA: Diagnosis not present

## 2015-11-19 DIAGNOSIS — Z471 Aftercare following joint replacement surgery: Secondary | ICD-10-CM | POA: Diagnosis not present

## 2015-11-19 DIAGNOSIS — I10 Essential (primary) hypertension: Secondary | ICD-10-CM | POA: Diagnosis not present

## 2015-11-19 DIAGNOSIS — Z96653 Presence of artificial knee joint, bilateral: Secondary | ICD-10-CM | POA: Diagnosis not present

## 2015-11-19 DIAGNOSIS — I442 Atrioventricular block, complete: Secondary | ICD-10-CM | POA: Diagnosis not present

## 2015-11-19 DIAGNOSIS — M1990 Unspecified osteoarthritis, unspecified site: Secondary | ICD-10-CM | POA: Diagnosis not present

## 2015-11-20 DIAGNOSIS — Z96652 Presence of left artificial knee joint: Secondary | ICD-10-CM | POA: Diagnosis not present

## 2015-11-20 DIAGNOSIS — M25662 Stiffness of left knee, not elsewhere classified: Secondary | ICD-10-CM | POA: Diagnosis not present

## 2015-11-20 DIAGNOSIS — M25462 Effusion, left knee: Secondary | ICD-10-CM | POA: Diagnosis not present

## 2015-11-20 DIAGNOSIS — R262 Difficulty in walking, not elsewhere classified: Secondary | ICD-10-CM | POA: Diagnosis not present

## 2015-11-20 NOTE — Discharge Summary (Signed)
SPORTS MEDICINE & JOINT REPLACEMENT   Georgena Spurling, MD   Altamese Cabal, PA-C [address] [place], [place], [place]  [address]                             [phone]  PATIENT ID: Lauren Henry        MRN:  [id number]          DOB/AGE: [birth year] / 74 y.o.    DISCHARGE SUMMARY  ADMISSION DATE:    11/05/2015 DISCHARGE DATE:   11/06/2015  ADMISSION DIAGNOSIS: primary osteoarthritis left knee    DISCHARGE DIAGNOSIS:  primary osteoarthritis left knee    ADDITIONAL DIAGNOSIS: Active Problems:   DJD (degenerative joint disease) of knee  Past Medical History  Diagnosis Date  . Hyperlipidemia     takes Pravastatin daily  . Hypothyroidism     takes Synthroid daily  . Hypertension     takes Azor and Metoprolol daily  . Arthritis   . Joint pain   . History of colon polyps   . Urinary urgency   . Urinary frequency   . History of blood transfusion as a child    no abnormal reaction noted  . Diarrhea     since gallbladder removed  . Atrial fibrillation (HCC)   . Presence of permanent cardiac pacemaker   . 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 admitted on 11/05/2015 and found to have a diagnosis of primary osteoarthritis left knee.  After appropriate laboratory studies were obtained  they were taken to the operating room on 11/05/2015 and underwent Procedure(s): TOTAL KNEE ARTHROPLASTY.   They were given perioperative antibiotics:  Anti-infectives    Start     Dose/Rate Route Frequency Ordered Stop   11/05/15 1500  ceFAZolin (ANCEF) IVPB 2 g/50 mL premix     2 g 100 mL/hr over 30 Minutes Intravenous Every 6 hours 11/05/15 1226 11/05/15 2247   11/05/15 0900  ceFAZolin (ANCEF) IVPB 2 g/50 mL premix     2 g 100 mL/hr over 30 Minutes Intravenous To [facility]  11/05/15 0710 11/05/15 0900    .  Tolerated the procedure well.  Placed with a foley intraoperatively.  Given Ofirmev at induction and for 48 hours.    POD# 1: Vital signs were stable.  Patient denied Chest pain, shortness of breath, or calf pain.  Patient was started on Lovenox 30 mg subcutaneously twice daily at 8am.  Consults to PT, OT, and care management were made.  The patient was weight bearing as tolerated.  CPM was placed on the operative leg 0-90 degrees for 6-8 hours a day.  Incentive spirometry was taught.  Dressing was changed.  Hemovac was discontinued.      Continued  PT for ambulation and exercise program.  IV saline locked.  O2 discontinued.    The remainder of the hospital course was dedicated to ambulation and strengthening.   The patient was discharged on day one post op in  Good condition.  Blood products given:none  DIAGNOSTIC STUDIES: Recent vital signs: No data found.      Recent laboratory studies: No results for input(s): WBC, HGB, HCT, PLT in the last 168 hours. No results  for input(s): NA, K, CL, CO2, BUN, CREATININE, GLUCOSE, CALCIUM in the last 168 hours. Lab Results  Component Value Date   INR 1.26 10/26/2015   INR 1.12 11/01/2014   INR 1.15 10/30/2014     Recent Radiographic Studies :  No results found.  DISCHARGE INSTRUCTIONS: Discharge Instructions    CPM    Complete by:  As directed   Continuous passive motion machine (CPM):      Use the CPM from 0 to 90 for 6-8 hours per day.      You may increase by 10 per day.  You may break it up into 2 or 3 sessions per day.      Use CPM for 2 weeks or until you are told to stop.     Call MD / Call 911    Complete by:  As directed   If you experience chest pain or shortness of breath, CALL 911 and be transported to the hospital emergency room.  If you develope a fever above 101 F, pus (white drainage) or increased drainage or redness at the wound, or calf pain, call your surgeon's office.     Change  dressing    Complete by:  As directed   Change dressing on Wednesday, then change the dressing daily with sterile 4 x 4 inch gauze dressing and apply TED hose.     Constipation Prevention    Complete by:  As directed   Drink plenty of fluids.  Prune juice may be helpful.  You may use a stool softener, such as Colace (over the counter) 100 mg twice a day.  Use MiraLax (over the counter) for constipation as needed.     Diet - low sodium heart healthy    Complete by:  As directed      Do not put a pillow under the knee. Place it under the heel.    Complete by:  As directed      Driving restrictions    Complete by:  As directed   No driving for 6 weeks     Increase activity slowly as tolerated    Complete by:  As directed      Lifting restrictions    Complete by:  As directed   No lifting for 6 weeks     TED hose    Complete by:  As directed   Use stockings (TED hose) for 2 weeks on both leg(s).  You may remove them at night for sleeping.           DISCHARGE MEDICATIONS:     Medication List    TAKE these medications        acetaminophen 500 MG tablet  Commonly known as:  TYLENOL  Take 500 mg by mouth at bedtime as needed (leg pain). For leg pain     apixaban 5 MG Tabs tablet  Commonly known as:  ELIQUIS  Take 1 tablet (5 mg total) by mouth 2 (two) times daily.     AZOR 5-20 MG tablet  Generic drug:  amLODipine-olmesartan  Take 3 tablets by mouth daily.     carvedilol 10 MG 24 hr capsule  Commonly known as:  COREG CR  Take 3 capsules (30 mg total) by mouth daily.     clobetasol cream 0.05 %  Commonly known as:  TEMOVATE  Apply 1 application topically daily as needed (dry skin on legs).     levothyroxine 200 MCG tablet  Commonly known as:  SYNTHROID, LEVOTHROID  Take 300 mcg by mouth daily. Take on Tuesday, Thursday, Sunday ONLY Take all other days     methocarbamol 500 MG tablet  Commonly known as:  ROBAXIN  Take 1 tablet (500 mg total) by mouth every  6 (six) hours as needed for muscle spasms.     multivitamin tablet  Take 1 tablet by mouth daily.     ondansetron 4 MG tablet  Commonly known as:  ZOFRAN  Take 1 tablet (4 mg total) by mouth every 8 (eight) hours as needed for nausea or vomiting.     oxyCODONE 5 MG immediate release tablet  Commonly known as:  Oxy IR/ROXICODONE  Take 1-2 tablets (5-10 mg total) by mouth every 3 (three) hours as needed for breakthrough pain.     Oxycodone HCl 10 MG Tabs  Take 1 tablet (10 mg total) by mouth 2 (two) times daily.     pravastatin 40 MG tablet  Commonly known as:  PRAVACHOL  Take 40 mg by mouth daily.     Vitamin D3 5000 UNITS Caps  Take 5,000 Units by mouth daily.        FOLLOW UP VISIT:       Follow-up Information    Follow up with Raymon Mutton, MD. Call on 11/20/2015.   Specialty:  Orthopedic Surgery   Contact information:   [address] [place] [address] [phone]       Follow up with [facility].   Why:  Someone from [facility] will contact you concerning start date and time for therapy.   Contact information:   [address] [place] [address] [phone]       DISPOSITION: HOME   CONDITION:  Good   Ortencia Askari 11/20/2015, 7:50 AM

## 2015-11-22 DIAGNOSIS — R262 Difficulty in walking, not elsewhere classified: Secondary | ICD-10-CM | POA: Diagnosis not present

## 2015-11-22 DIAGNOSIS — M25662 Stiffness of left knee, not elsewhere classified: Secondary | ICD-10-CM | POA: Diagnosis not present

## 2015-11-22 DIAGNOSIS — Z96652 Presence of left artificial knee joint: Secondary | ICD-10-CM | POA: Diagnosis not present

## 2015-11-27 DIAGNOSIS — R262 Difficulty in walking, not elsewhere classified: Secondary | ICD-10-CM | POA: Diagnosis not present

## 2015-11-27 DIAGNOSIS — M25662 Stiffness of left knee, not elsewhere classified: Secondary | ICD-10-CM | POA: Diagnosis not present

## 2015-11-27 DIAGNOSIS — Z96652 Presence of left artificial knee joint: Secondary | ICD-10-CM | POA: Diagnosis not present

## 2015-11-29 DIAGNOSIS — Z96652 Presence of left artificial knee joint: Secondary | ICD-10-CM | POA: Diagnosis not present

## 2015-11-29 DIAGNOSIS — M25662 Stiffness of left knee, not elsewhere classified: Secondary | ICD-10-CM | POA: Diagnosis not present

## 2015-11-29 DIAGNOSIS — R262 Difficulty in walking, not elsewhere classified: Secondary | ICD-10-CM | POA: Diagnosis not present

## 2015-12-03 DIAGNOSIS — R262 Difficulty in walking, not elsewhere classified: Secondary | ICD-10-CM | POA: Diagnosis not present

## 2015-12-03 DIAGNOSIS — M25662 Stiffness of left knee, not elsewhere classified: Secondary | ICD-10-CM | POA: Diagnosis not present

## 2015-12-03 DIAGNOSIS — Z96652 Presence of left artificial knee joint: Secondary | ICD-10-CM | POA: Diagnosis not present

## 2015-12-05 DIAGNOSIS — I442 Atrioventricular block, complete: Secondary | ICD-10-CM | POA: Diagnosis not present

## 2015-12-05 DIAGNOSIS — Z7901 Long term (current) use of anticoagulants: Secondary | ICD-10-CM | POA: Diagnosis not present

## 2015-12-05 DIAGNOSIS — F419 Anxiety disorder, unspecified: Secondary | ICD-10-CM | POA: Diagnosis not present

## 2015-12-05 DIAGNOSIS — E668 Other obesity: Secondary | ICD-10-CM | POA: Diagnosis not present

## 2015-12-05 DIAGNOSIS — J029 Acute pharyngitis, unspecified: Secondary | ICD-10-CM | POA: Diagnosis not present

## 2015-12-05 DIAGNOSIS — M1990 Unspecified osteoarthritis, unspecified site: Secondary | ICD-10-CM | POA: Diagnosis not present

## 2015-12-05 DIAGNOSIS — Z950 Presence of cardiac pacemaker: Secondary | ICD-10-CM | POA: Diagnosis not present

## 2015-12-05 DIAGNOSIS — E559 Vitamin D deficiency, unspecified: Secondary | ICD-10-CM | POA: Diagnosis not present

## 2015-12-05 DIAGNOSIS — E7800 Pure hypercholesterolemia, unspecified: Secondary | ICD-10-CM | POA: Diagnosis not present

## 2015-12-11 DIAGNOSIS — R262 Difficulty in walking, not elsewhere classified: Secondary | ICD-10-CM | POA: Diagnosis not present

## 2015-12-11 DIAGNOSIS — M25662 Stiffness of left knee, not elsewhere classified: Secondary | ICD-10-CM | POA: Diagnosis not present

## 2015-12-11 DIAGNOSIS — Z96652 Presence of left artificial knee joint: Secondary | ICD-10-CM | POA: Diagnosis not present

## 2015-12-19 DIAGNOSIS — M25662 Stiffness of left knee, not elsewhere classified: Secondary | ICD-10-CM | POA: Diagnosis not present

## 2015-12-19 DIAGNOSIS — R262 Difficulty in walking, not elsewhere classified: Secondary | ICD-10-CM | POA: Diagnosis not present

## 2015-12-19 DIAGNOSIS — Z96652 Presence of left artificial knee joint: Secondary | ICD-10-CM | POA: Diagnosis not present

## 2016-01-09 ENCOUNTER — Encounter (HOSPITAL_COMMUNITY): Payer: Self-pay | Admitting: Emergency Medicine

## 2016-01-09 ENCOUNTER — Emergency Department (HOSPITAL_COMMUNITY): Payer: Medicare Other

## 2016-01-09 ENCOUNTER — Emergency Department (HOSPITAL_COMMUNITY)
Admission: EM | Admit: 2016-01-09 | Discharge: 2016-01-09 | Disposition: A | Discharge status: Home / Self Care | Payer: Medicare Other | Attending: Emergency Medicine | Admitting: Emergency Medicine

## 2016-01-09 DIAGNOSIS — E039 Hypothyroidism, unspecified: Secondary | ICD-10-CM | POA: Diagnosis not present

## 2016-01-09 DIAGNOSIS — Z950 Presence of cardiac pacemaker: Secondary | ICD-10-CM | POA: Insufficient documentation

## 2016-01-09 DIAGNOSIS — S81012A Laceration without foreign body, left knee, initial encounter: Secondary | ICD-10-CM | POA: Diagnosis present

## 2016-01-09 DIAGNOSIS — I10 Essential (primary) hypertension: Secondary | ICD-10-CM | POA: Insufficient documentation

## 2016-01-09 DIAGNOSIS — Z79899 Other long term (current) drug therapy: Secondary | ICD-10-CM | POA: Diagnosis not present

## 2016-01-09 DIAGNOSIS — W010XXA Fall on same level from slipping, tripping and stumbling without subsequent striking against object, initial encounter: Secondary | ICD-10-CM | POA: Insufficient documentation

## 2016-01-09 DIAGNOSIS — W19XXXA Unspecified fall, initial encounter: Secondary | ICD-10-CM

## 2016-01-09 DIAGNOSIS — S81812A Laceration without foreign body, left lower leg, initial encounter: Secondary | ICD-10-CM

## 2016-01-09 DIAGNOSIS — E785 Hyperlipidemia, unspecified: Secondary | ICD-10-CM | POA: Insufficient documentation

## 2016-01-09 DIAGNOSIS — M1990 Unspecified osteoarthritis, unspecified site: Secondary | ICD-10-CM | POA: Insufficient documentation

## 2016-01-09 DIAGNOSIS — Y9389 Activity, other specified: Secondary | ICD-10-CM | POA: Diagnosis not present

## 2016-01-09 DIAGNOSIS — Y9289 Other specified places as the place of occurrence of the external cause: Secondary | ICD-10-CM | POA: Insufficient documentation

## 2016-01-09 DIAGNOSIS — Y998 Other external cause status: Secondary | ICD-10-CM | POA: Diagnosis not present

## 2016-01-09 DIAGNOSIS — Z86010 Personal history of colonic polyps: Secondary | ICD-10-CM | POA: Insufficient documentation

## 2016-01-09 DIAGNOSIS — I4891 Unspecified atrial fibrillation: Secondary | ICD-10-CM | POA: Diagnosis not present

## 2016-01-09 MED ORDER — CEPHALEXIN 500 MG PO CAPS: 500.0000 mg | ORAL_CAPSULE | Freq: Four times a day (QID) | ORAL | Status: DC

## 2016-01-09 MED ORDER — LIDOCAINE-EPINEPHRINE (PF) 2 %-1:200000 IJ SOLN
10.0000 mL | Freq: Once | INTRAMUSCULAR | Status: AC
  Administered 2016-01-09: 10 mL via INTRADERMAL
  Filled 2016-01-09: qty 10

## 2016-01-09 NOTE — ED Notes (Signed)
Suture cart at bedside 

## 2016-01-09 NOTE — ED Notes (Signed)
Pt is 8 weeks post op knee replacement and was outside and tripped over a stone and landed onto left knee causing a laceration to left knee. Pressure dressing applied and sterile gauze. Bleeding controlled. Pt denies hitting head or LOC.

## 2016-01-09 NOTE — ED Provider Notes (Signed)
Procedure note - Laceration repair HPI: 75 y.o. female with history of left knee replacement 8 weeks ago, on eliquis, with mechanical fall causing laceration of anterior left knee. Laceration: anterior left knee, midline extending from lower pole patella to upper tibia, approx 5cm, linear with well demarcated edges (appeared to be over her TKR surgical scar) Procedure: Area was prepped in usual sterile fashion with alcohol iodine swabs. 2cc of 2% lidocaine with epi was injected on the outside of the laceration with adequate anesthesia. A 3-0 ethilon was used for 5 simple interrupted sutures with good approximation. There was a moderate amount of swelling. Hemostasis achieved with occlusive dressing and pressure. Note that during cleaning skin wound again started oozing some blood, hemostatic with again occlusive dressing and pressure for approx 1 minute. Finished cleaning and covered with antibiotic ointment, gauze and kerlix bandage.   Patient instructed to expect some bleeding, if occurs apply pressure to area with bandage; if unable to stop or bleeding becomes profuse to return to the ED. Patient also instructed to schedule follow up appointment with orthopedic surgeon for wound evaluation.  Tawni Carnes, MD 01/09/2016, 3:53 PM PGY-3, [facility]   Nani Ravens, MD 01/09/16 1557  Blane Ohara, MD 01/09/16 [phone]

## 2016-01-09 NOTE — Discharge Instructions (Signed)
[name],  Nice meeting you! Please follow-up with your orthopedist. Return to the emergency department if you develop fevers, chills, increased pain, or if your sutures open. Feel better soon!  S. Lane Hacker, PA-C  Laceration Care, Adult A laceration is a cut that goes through all of the layers of the skin and into the tissue that is right under the skin. Some lacerations heal on their own. Others need to be closed with stitches (sutures), staples, skin adhesive strips, or skin glue. Proper laceration care minimizes the risk of infection and helps the laceration to heal better. HOW TO CARE FOR YOUR LACERATION If sutures or staples were used:  Keep the wound clean and dry.  If you were given a bandage (dressing), you should change it at least one time per day or as told by your health care provider. You should also change it if it becomes wet or dirty.  Keep the wound completely dry for the first 24 hours or as told by your health care provider. After that time, you may shower or bathe. However, make sure that the wound is not soaked in water until after the sutures or staples have been removed.  Clean the wound one time each day or as told by your health care provider:  Wash the wound with soap and water.  Rinse the wound with water to remove all soap.  Pat the wound dry with a clean towel. Do not rub the wound.  After cleaning the wound, apply a thin layer of antibiotic ointmentas told by your health care provider. This will help to prevent infection and keep the dressing from sticking to the wound.  Have the sutures or staples removed as told by your health care provider. If skin adhesive strips were used:  Keep the wound clean and dry.  If you were given a bandage (dressing), you should change it at least one time per day or as told by your health care provider. You should also change it if it becomes dirty or wet.  Do not get the skin adhesive strips wet. You may  shower or bathe, but be careful to keep the wound dry.  If the wound gets wet, pat it dry with a clean towel. Do not rub the wound.  Skin adhesive strips fall off on their own. You may trim the strips as the wound heals. Do not remove skin adhesive strips that are still stuck to the wound. They will fall off in time. If skin glue was used:  Try to keep the wound dry, but you may briefly wet it in the shower or bath. Do not soak the wound in water, such as by swimming.  After you have showered or bathed, gently pat the wound dry with a clean towel. Do not rub the wound.  Do not do any activities that will make you sweat heavily until the skin glue has fallen off on its own.  Do not apply liquid, cream, or ointment medicine to the wound while the skin glue is in place. Using those may loosen the film before the wound has healed.  If you were given a bandage (dressing), you should change it at least one time per day or as told by your health care provider. You should also change it if it becomes dirty or wet.  If a dressing is placed over the wound, be careful not to apply tape directly over the skin glue. Doing that may cause the glue to  be pulled off before the wound has healed.  Do not pick at the glue. The skin glue usually remains in place for 5-10 days, then it falls off of the skin. General Instructions  Take over-the-counter and prescription medicines only as told by your health care provider.  If you were prescribed an antibiotic medicine or ointment, take or apply it as told by your doctor. Do not stop using it even if your condition improves.  To help prevent scarring, make sure to cover your wound with sunscreen whenever you are outside after stitches are removed, after adhesive strips are removed, or when glue remains in place and the wound is healed. Make sure to wear a sunscreen of at least 30 SPF.  Do not scratch or pick at the wound.  Keep all follow-up visits as told by  your health care provider. This is important.  Check your wound every day for signs of infection. Watch for:  Redness, swelling, or pain.  Fluid, blood, or pus.  Raise (elevate) the injured area above the level of your heart while you are sitting or lying down, if possible. SEEK MEDICAL CARE IF:  You received a tetanus shot and you have swelling, severe pain, redness, or bleeding at the injection site.  You have a fever.  A wound that was closed breaks open.  You notice a bad smell coming from your wound or your dressing.  You notice something coming out of the wound, such as wood or glass.  Your pain is not controlled with medicine.  You have increased redness, swelling, or pain at the site of your wound.  You have fluid, blood, or pus coming from your wound.  You notice a change in the color of your skin near your wound.  You need to change the dressing frequently due to fluid, blood, or pus draining from the wound.  You develop a new rash.  You develop numbness around the wound. SEEK IMMEDIATE MEDICAL CARE IF:  You develop severe swelling around the wound.  Your pain suddenly increases and is severe.  You develop painful lumps near the wound or on skin that is anywhere on your body.  You have a red streak going away from your wound.  The wound is on your hand or foot and you cannot properly move a finger or toe.  The wound is on your hand or foot and you notice that your fingers or toes look pale or bluish.   This information is not intended to replace advice given to you by your health care provider. Make sure you discuss any questions you have with your health care provider.   Document Released: 12/08/2005 Document Revised: 04/24/2015 Document Reviewed: 12/04/2014 Elsevier Interactive Patient Education [employer]

## 2016-01-09 NOTE — ED Provider Notes (Signed)
CSN: [id number]     Arrival date & time 01/09/16  1118 History   First MD Initiated Contact with Patient 01/09/16 1410     Chief Complaint  Patient presents with  . Fall  . Extremity Laceration   HPI   Lauren Henry is a 75 y.o. F PMH significant for HLD, HTN, s/p L TKR 8 weeks ago presenting s/p mechanical fall with left knee laceration and pain.  She tripped over a stone and landed on her left knee. She is on eliquis but her bleeding was controlled PTA. No LOC, head injury, CP, SOB, abdominal pain, N/V/D, loss of bladder/bowel control.  Past Medical History  Diagnosis Date  . Hyperlipidemia     takes Pravastatin daily  . Hypothyroidism     takes Synthroid daily  . Hypertension     takes Azor and Metoprolol daily  . Arthritis   . Joint pain   . History of colon polyps   . Urinary urgency   . Urinary frequency   . History of blood transfusion as a child    no abnormal reaction noted  . Diarrhea     since gallbladder removed  . Atrial fibrillation (HCC)   . Presence of permanent cardiac pacemaker   . 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 Cholecystectomy  1971  . Stomach stapled  1985  . Joint replacement Right     knee  . Colonoscopy    . Cataract surgery Bilateral   . Abdominal hysterectomy    . Permanent pacemaker insertion N/A 11/01/2014    Procedure: PERMANENT PACEMAKER INSERTION;  Surgeon: Marinus Maw, MD;  Location: [facility] CATH LAB;  Service: Cardiovascular;  Laterality: N/A;  . Eye surgery    . Insert / replace / remove pacemaker      Medtronic PPM 11/01/14 (Dr. Lewayne Bunting)  . Total knee arthroplasty Left 11/05/2015    Procedure: TOTAL KNEE ARTHROPLASTY;  Surgeon: Dannielle Huh, MD;  Location: [facility] OR;  Service: Orthopedics;  Laterality: Left;   No family history on file. Social History  Substance Use Topics  . Smoking status: Never Smoker   . Smokeless  tobacco: Never Used  . Alcohol Use: 0.6 oz/week    1 Glasses of wine per week   OB History    No data available     Review of Systems  Ten systems are reviewed and are negative for acute change except as noted in the HPI  Allergies  Review of patient's allergies indicates no known allergies.  Home Medications   Prior to Admission medications   Medication Sig Start Date End Date Taking? Authorizing Provider  acetaminophen (TYLENOL) 500 MG tablet Take 500 mg by mouth at bedtime as needed (leg pain). For leg pain   Yes Historical Provider, MD  amLODipine-olmesartan (AZOR) 5-20 MG per tablet Take 1 tablet by mouth daily.    Yes Historical Provider, MD  apixaban (ELIQUIS) 5 MG TABS tablet Take 1 tablet (5 mg total) by mouth 2 (two) times daily. 11/06/15  Yes Altamese Cabal, PA-C  carvedilol (COREG CR) 10 MG 24 hr capsule Take 3 capsules (30 mg total) by mouth daily. 10/18/15  Yes Marinus Maw, MD  Cholecalciferol (VITAMIN D3) 5000 UNITS CAPS Take 5,000 Units by mouth daily.   Yes Historical Provider, MD  clobetasol cream (TEMOVATE) 0.05 % Apply 1 application topically daily as  needed (dry skin on legs).  08/14/15  Yes Historical Provider, MD  levothyroxine (SYNTHROID, LEVOTHROID) 200 MCG tablet Take 300 mcg by mouth daily. Take on Monday and Thursday  ONLY Take all other days   Yes Historical Provider, MD  Multiple Vitamin (MULTIVITAMIN) tablet Take 1 tablet by mouth daily.   Yes Historical Provider, MD  pravastatin (PRAVACHOL) 40 MG tablet Take 40 mg by mouth daily.   Yes Historical Provider, MD  methocarbamol (ROBAXIN) 500 MG tablet Take 1 tablet (500 mg total) by mouth every 6 (six) hours as needed for muscle spasms. 11/06/15   Altamese Cabal, PA-C   BP 171/65 mmHg  Pulse 62  Temp(Src) 98.1 F (36.7 C) (Oral)  Resp 16  Ht  (1.651 m)  Wt 85.276 kg  BMI 31.28 kg/m2  SpO2 100% Physical Exam  Constitutional: She is oriented to person, place, and time. She appears  well-developed and well-nourished. No distress.  HENT:  Head: Normocephalic and atraumatic.  Mouth/Throat: Oropharynx is clear and moist. No oropharyngeal exudate.  Eyes: Conjunctivae are normal. Pupils are equal, round, and reactive to light. Right eye exhibits no discharge. Left eye exhibits no discharge. No scleral icterus.  Neck: Normal range of motion. No tracheal deviation present.  Cardiovascular: Normal rate, regular rhythm, normal heart sounds and intact distal pulses.  Exam reveals no gallop and no friction rub.   No murmur heard. Pulmonary/Chest: Effort normal and breath sounds normal. No respiratory distress. She has no wheezes. She has no rales. She exhibits no tenderness.  Abdominal: Soft. Bowel sounds are normal. She exhibits no distension and no mass. There is no tenderness. There is no rebound and no guarding.  Musculoskeletal: She exhibits no edema.  Left knee with effusion and 5 cm laceration. Neurovascularly intact BL.   Lymphadenopathy:    She has no cervical adenopathy.  Neurological: She is alert and oriented to person, place, and time. No cranial nerve deficit. Coordination normal.  Skin: Skin is warm and dry. No rash noted. She is not diaphoretic. No erythema.  Psychiatric: She has a normal mood and affect. Her behavior is normal.  Nursing note and vitals reviewed.   ED Course  Procedures   Imaging Review Dg Knee Complete 4 Views Left  01/09/2016  CLINICAL DATA:  Status post total knee arthroplasty.  Fall today. EXAM: LEFT KNEE - COMPLETE 4+ VIEW COMPARISON:  01/18/2014 FINDINGS: Interval total knee arthroplasty. No acute hardware complication. No joint effusion. prepatellar edema and gas with overlying bandage. Vascular calcifications. IMPRESSION: Prepatellar soft tissue swelling, without acute osseous abnormality. Electronically Signed   By: Jeronimo Greaves M.D.   On: 01/09/2016 13:14   I have personally reviewed and evaluated these images and lab results as part of  my medical decision-making.  MDM   Final diagnoses:  Fall, initial encounter  Laceration of lower extremity, left, initial encounter   Patient non-toxic appearing and VSS. Left knee xray unremarkable for fracture. Prepatellar ST swelling noted.  Laceration repaired by Marney Setting, MD.  Patient may be safely discharged home with prophylactic keflex. Discussed reasons for return. Patient to follow-up with orthopedics. Patient in understanding and agreement with the plan.   Melton Krebs, PA-C 01/12/16 0006  Blane Ohara, MD 01/13/16 0001

## 2016-01-09 NOTE — ED Notes (Signed)
Pt verbalized understanding of d/c instructions, prescriptions, and follow-up care. No further questions/concerns, VSS, assisted to lobby in wheelchair.  

## 2016-01-17 ENCOUNTER — Ambulatory Visit (INDEPENDENT_AMBULATORY_CARE_PROVIDER_SITE_OTHER): Payer: Medicare Other | Admitting: *Deleted

## 2016-01-17 DIAGNOSIS — I442 Atrioventricular block, complete: Secondary | ICD-10-CM

## 2016-01-17 NOTE — Progress Notes (Signed)
Remote pacemaker transmission.   

## 2016-01-19 ENCOUNTER — Encounter (HOSPITAL_COMMUNITY): Payer: Self-pay | Admitting: Emergency Medicine

## 2016-01-19 ENCOUNTER — Emergency Department (HOSPITAL_COMMUNITY)
Admission: EM | Admit: 2016-01-19 | Discharge: 2016-01-19 | Disposition: A | Discharge status: Home / Self Care | Payer: Medicare Other | Attending: Emergency Medicine | Admitting: Emergency Medicine

## 2016-01-19 DIAGNOSIS — Y658 Other specified misadventures during surgical and medical care: Secondary | ICD-10-CM | POA: Insufficient documentation

## 2016-01-19 DIAGNOSIS — Z8739 Personal history of other diseases of the musculoskeletal system and connective tissue: Secondary | ICD-10-CM | POA: Diagnosis not present

## 2016-01-19 DIAGNOSIS — E785 Hyperlipidemia, unspecified: Secondary | ICD-10-CM | POA: Diagnosis not present

## 2016-01-19 DIAGNOSIS — I4891 Unspecified atrial fibrillation: Secondary | ICD-10-CM | POA: Insufficient documentation

## 2016-01-19 DIAGNOSIS — Z7902 Long term (current) use of antithrombotics/antiplatelets: Secondary | ICD-10-CM | POA: Diagnosis not present

## 2016-01-19 DIAGNOSIS — Z86010 Personal history of colonic polyps: Secondary | ICD-10-CM | POA: Diagnosis not present

## 2016-01-19 DIAGNOSIS — E039 Hypothyroidism, unspecified: Secondary | ICD-10-CM | POA: Insufficient documentation

## 2016-01-19 DIAGNOSIS — Z950 Presence of cardiac pacemaker: Secondary | ICD-10-CM | POA: Diagnosis not present

## 2016-01-19 DIAGNOSIS — T8131XA Disruption of external operation (surgical) wound, not elsewhere classified, initial encounter: Secondary | ICD-10-CM | POA: Insufficient documentation

## 2016-01-19 DIAGNOSIS — Z79899 Other long term (current) drug therapy: Secondary | ICD-10-CM | POA: Diagnosis not present

## 2016-01-19 NOTE — ED Notes (Signed)
Pt ambulates independently and with steady gait at time of discharge. Discharge instructions and follow up information reviewed with patient. No other questions or concerns voiced at this time.  

## 2016-01-19 NOTE — ED Notes (Signed)
Pt. reports left knee incision dehisced this afternoon , history of left knee replacement last 01/09/16 by Dr. Sherlean Foot. Pt. stated sutures were removed 2 days ago.

## 2016-01-19 NOTE — ED Provider Notes (Signed)
CSN: [id number]     Arrival date & time 01/19/16  1933 History   First MD Initiated Contact with Patient 01/19/16 2001     Chief Complaint  Patient presents with  . Post-op Problem  . Wound Dehiscence     (Consider location/radiation/quality/duration/timing/severity/associated sxs/prior Treatment) HPI Comments: Patient had sutures removed for knee surgery 2 days was doing well until this evening when noticed dehiscence of wound small amount of bleeding that has now stopped. Is on blood thinners at this time  The history is provided by the patient.    Past Medical History  Diagnosis Date  . Hyperlipidemia     takes Pravastatin daily  . Hypothyroidism     takes Synthroid daily  . Hypertension     takes Azor and Metoprolol daily  . Arthritis   . Joint pain   . History of colon polyps   . Urinary urgency   . Urinary frequency   . History of blood transfusion as a child    no abnormal reaction noted  . Diarrhea     since gallbladder removed  . Atrial fibrillation (HCC)   . Presence of permanent cardiac pacemaker   . 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 Cholecystectomy  1971  . Stomach stapled  1985  . Joint replacement Right     knee  . Colonoscopy    . Cataract surgery Bilateral   . Abdominal hysterectomy    . Permanent pacemaker insertion N/A 11/01/2014    Procedure: PERMANENT PACEMAKER INSERTION;  Surgeon: Marinus Maw, MD;  Location: [facility] CATH LAB;  Service: Cardiovascular;  Laterality: N/A;  . Eye surgery    . Insert / replace / remove pacemaker      Medtronic PPM 11/01/14 (Dr. Lewayne Bunting)  . Total knee arthroplasty Left 11/05/2015    Procedure: TOTAL KNEE ARTHROPLASTY;  Surgeon: Dannielle Huh, MD;  Location: [facility] OR;  Service: Orthopedics;  Laterality: Left;   No family history on file. Social History  Substance Use Topics  . Smoking status: Never Smoker     . Smokeless tobacco: Never Used  . Alcohol Use: 0.6 oz/week    1 Glasses of wine per week   OB History    No data available     Review of Systems  Constitutional: Negative for fever and chills.  Musculoskeletal: Negative for joint swelling.  Skin: Positive for wound.  All other systems reviewed and are negative.     Allergies  Review of patient's allergies indicates no known allergies.  Home Medications   Prior to Admission medications   Medication Sig Start Date End Date Taking? Authorizing Provider  acetaminophen (TYLENOL) 500 MG tablet Take 1,000 mg by mouth at bedtime as needed (pain). For leg pain   Yes Historical Provider, MD  amLODipine-olmesartan (AZOR) 5-20 MG per tablet Take 1 tablet by mouth daily.    Yes Historical Provider, MD  apixaban (ELIQUIS) 5 MG TABS tablet Take 1 tablet (5 mg total) by mouth 2 (two) times daily. 11/06/15  Yes Altamese Cabal, PA-C  carvedilol (COREG CR) 10 MG 24 hr capsule Take 3 capsules (30 mg total) by mouth daily. 10/18/15  Yes Marinus Maw, MD  Cholecalciferol (VITAMIN D3) 5000 UNITS CAPS Take 5,000 Units by mouth daily.   Yes Historical Provider, MD  clobetasol cream (TEMOVATE) 0.05 % Apply 1 application topically daily as needed (  dry skin on legs).  08/14/15  Yes Historical Provider, MD  levothyroxine (SYNTHROID, LEVOTHROID) 100 MCG tablet Take 100 mcg by mouth 2 (two) times a week. Take with a 200 mcg tablet on Mondays and Thursdays for a 300 mcg dose NAME BRAND   Yes Historical Provider, MD  levothyroxine (SYNTHROID, LEVOTHROID) 200 MCG tablet Take 200 mcg by mouth daily. Take with a 100 mcg tablet on Mondays and Thursdays for a 300 mcg dose (all other days take 200 mcg tablet) NAME BRAND   Yes Historical Provider, MD  Multiple Vitamin (MULTIVITAMIN WITH MINERALS) TABS tablet Take 1 tablet by mouth daily.   Yes Historical Provider, MD  neomycin-bacitracin-polymyxin (NEOSPORIN) OINT Apply 1 application topically daily as needed for wound  care.   Yes Historical Provider, MD  pravastatin (PRAVACHOL) 40 MG tablet Take 40 mg by mouth daily.   Yes Historical Provider, MD  methocarbamol (ROBAXIN) 500 MG tablet Take 1 tablet (500 mg total) by mouth every 6 (six) hours as needed for muscle spasms. Patient not taking: Reported on 01/19/2016 11/06/15   Altamese Cabal, PA-C   BP 195/93 mmHg  Pulse 75  Temp(Src) 98 F (36.7 C) (Oral)  Resp 18  SpO2 99% Physical Exam  Constitutional: She appears well-developed and well-nourished.  HENT:  Head: Normocephalic.  Eyes: Pupils are equal, round, and reactive to light.  Cardiovascular: Normal rate.   Pulmonary/Chest: Effort normal.  Musculoskeletal: She exhibits no edema or tenderness.       Left knee: She exhibits normal range of motion, no swelling, no effusion and no laceration. No tenderness found.       Legs: Neurological: She is alert.  Skin: Skin is warm.  Vitals reviewed.   ED Course  Procedures (including critical care time) Labs Review Labs Reviewed - No data to display  Imaging Review No results found. I have personally reviewed and evaluated these images and lab results as part of my medical decision-making.   EKG Interpretation None      MDM   Final diagnoses:  Wound dehiscence, initial encounter         Earley Favor, NP 01/19/16 2111  Laurence Spates, MD 01/19/16 2306

## 2016-01-19 NOTE — Discharge Instructions (Signed)
Wound Dehiscence Wound dehiscence is when a surgical cut (incision) opens up and does not heal properly. It usually happens 7-10 days after surgery. You may have bleeding from the cut. You may also have pain or a fever. This condition should be treated early. HOME CARE  Only take medicines as told by your doctor.  Take your antibiotic medicine as told. Finish it even if you start to feel better.  Wash your wound with warm, soapy water 2 times a day, or as told. Pat the wound dry. Do not rub the wound.  Change bandages (dressings) as often as told. Wash your hands before and after changing bandages. Apply bandages as told.  Take showers. Do not soak the wound, bathe, swim, or use a hot tub until directed by your doctor.  Avoid exercises that make you sweat.  Use medicines that stop itching as told by your doctor. The wound may itch as it heals. Do not pick or scratch at the wound.  Do not lift more than 10 pounds (4.5 kilograms) until the wound is healed, or as told by your doctor.  Keep all doctor visits as told. GET HELP IF:  You have a lot of bleeding from your surgical cut.  Your wound does not seem to be healing right.  You have a fever. GET HELP RIGHT AWAY IF:  You have more puffiness (swelling) or redness around the wound.  You have more pain in the wound.  You have yellowish white fluid (pus) coming from the wound.  More of the wound breaks open. MAKE SURE YOU:  Understand these instructions.  Will watch your condition.  Will get help right away if you are not doing well or get worse.   This information is not intended to replace advice given to you by your health care provider. Make sure you discuss any questions you have with your health care provider.   Document Released: 11/26/2009 Document Revised: 12/29/2014 Document Reviewed: 08/15/2013 Elsevier Interactive Patient Education 2016 [employer]. You have been placed in knee immobilizer that you should  wear when up and about.  Please watch the dressing for any bleeding or purulent discharge.  Also be aware of any change in temperature If you develop any of these symptoms please return immediately to the ED for evaluation  Please call Dr. Sherlean Foot first thing Monday morning to set an appointment time

## 2016-01-23 ENCOUNTER — Telehealth: Payer: Self-pay | Admitting: Internal Medicine

## 2016-01-23 NOTE — Telephone Encounter (Signed)
Pt called in stating that she has switched insurance companies and she is now with [facility]. She says that her Eliquis is no longer covered [facility] and she will need a prior authorization . Please assist

## 2016-01-23 NOTE — Telephone Encounter (Signed)
Advised patient that I will need her new Insurance info. She will ask her pharmacy to fax it to [place].

## 2016-01-25 ENCOUNTER — Telehealth: Payer: Self-pay

## 2016-01-25 NOTE — Telephone Encounter (Signed)
Prior auth for Eliquis 5 mg submitted to [facility]. 

## 2016-01-28 ENCOUNTER — Telehealth: Payer: Self-pay

## 2016-01-28 NOTE — Telephone Encounter (Signed)
Prior auth for Eliquis  approved through 12/21/2016. [id number]#[id number].

## 2016-01-30 LAB — CUP PACEART REMOTE DEVICE CHECK
Battery Remaining Longevity: 156 mo
Brady Statistic AS VS Percent: 5 %
Implantable Lead Implant Date: 20151111
Implantable Lead Location: 753859
Implantable Lead Model: 5076
Lead Channel Pacing Threshold Amplitude: 1 V
Lead Channel Pacing Threshold Pulse Width: 0.4 ms
Lead Channel Sensing Intrinsic Amplitude: 2.8 mV
MDC IDC LEAD IMPLANT DT: 20151111
MDC IDC LEAD LOCATION: 753860
MDC IDC MSMT BATTERY IMPEDANCE: 100 Ohm
MDC IDC MSMT BATTERY VOLTAGE: 2.79 V
MDC IDC MSMT LEADCHNL RA IMPEDANCE VALUE: 448 Ohm
MDC IDC MSMT LEADCHNL RA PACING THRESHOLD AMPLITUDE: 0.75 V
MDC IDC MSMT LEADCHNL RA PACING THRESHOLD PULSEWIDTH: 0.4 ms
MDC IDC MSMT LEADCHNL RV IMPEDANCE VALUE: 629 Ohm
MDC IDC SESS DTM: 20170126134606
MDC IDC SET LEADCHNL RA PACING AMPLITUDE: 1.5 V
MDC IDC SET LEADCHNL RV PACING AMPLITUDE: 2 V
MDC IDC SET LEADCHNL RV PACING PULSEWIDTH: 0.4 ms
MDC IDC SET LEADCHNL RV SENSING SENSITIVITY: 4 mV
MDC IDC STAT BRADY AP VP PERCENT: 20 %
MDC IDC STAT BRADY AP VS PERCENT: 0 %
MDC IDC STAT BRADY AS VP PERCENT: 75 %

## 2016-02-01 ENCOUNTER — Encounter: Payer: Self-pay | Admitting: Cardiology

## 2016-04-17 ENCOUNTER — Ambulatory Visit (INDEPENDENT_AMBULATORY_CARE_PROVIDER_SITE_OTHER): Payer: Medicare Other | Admitting: *Deleted

## 2016-04-17 DIAGNOSIS — I442 Atrioventricular block, complete: Secondary | ICD-10-CM | POA: Diagnosis not present

## 2016-04-17 LAB — CUP PACEART REMOTE DEVICE CHECK
Brady Statistic AP VS Percent: 0 %
Brady Statistic AS VS Percent: 3 %
Implantable Lead Implant Date: 20151111
Implantable Lead Implant Date: 20151111
Lead Channel Impedance Value: 623 Ohm
Lead Channel Pacing Threshold Amplitude: 1.125 V
Lead Channel Pacing Threshold Pulse Width: 0.4 ms
Lead Channel Pacing Threshold Pulse Width: 0.4 ms
Lead Channel Setting Pacing Amplitude: 1.75 V
Lead Channel Setting Sensing Sensitivity: 4 mV
MDC IDC LEAD LOCATION: 753859
MDC IDC LEAD LOCATION: 753860
MDC IDC MSMT BATTERY IMPEDANCE: 113 Ohm
MDC IDC MSMT BATTERY REMAINING LONGEVITY: 147 mo
MDC IDC MSMT BATTERY VOLTAGE: 2.79 V
MDC IDC MSMT LEADCHNL RA IMPEDANCE VALUE: 454 Ohm
MDC IDC MSMT LEADCHNL RA PACING THRESHOLD AMPLITUDE: 0.875 V
MDC IDC MSMT LEADCHNL RA SENSING INTR AMPL: 2.8 mV
MDC IDC SESS DTM: 20170427104550
MDC IDC SET LEADCHNL RV PACING AMPLITUDE: 2.25 V
MDC IDC SET LEADCHNL RV PACING PULSEWIDTH: 0.4 ms
MDC IDC STAT BRADY AP VP PERCENT: 21 %
MDC IDC STAT BRADY AS VP PERCENT: 76 %

## 2016-04-17 NOTE — Progress Notes (Signed)
Remote pacemaker transmission.   

## 2016-05-28 ENCOUNTER — Encounter: Payer: Self-pay | Admitting: Cardiology

## 2016-07-17 ENCOUNTER — Telehealth: Payer: Self-pay | Admitting: Cardiology

## 2016-07-17 ENCOUNTER — Ambulatory Visit (INDEPENDENT_AMBULATORY_CARE_PROVIDER_SITE_OTHER): Payer: Medicare Other | Admitting: *Deleted

## 2016-07-17 DIAGNOSIS — I442 Atrioventricular block, complete: Secondary | ICD-10-CM | POA: Diagnosis not present

## 2016-07-17 NOTE — Telephone Encounter (Signed)
Attempted to confirm remote transmission with pt. No answer and was unable to leave a message.   

## 2016-07-18 ENCOUNTER — Encounter: Payer: Self-pay | Admitting: Cardiology

## 2016-07-18 LAB — CUP PACEART REMOTE DEVICE CHECK
Battery Remaining Longevity: 147 mo
Brady Statistic AP VS Percent: 0 %
Brady Statistic AS VP Percent: 77 %
Brady Statistic AS VS Percent: 2 %
Date Time Interrogation Session: 20170727192448
Implantable Lead Implant Date: 20151111
Implantable Lead Location: 753860
Implantable Lead Model: 5076
Lead Channel Impedance Value: 606 Ohm
Lead Channel Pacing Threshold Amplitude: 1.125 V
Lead Channel Pacing Threshold Pulse Width: 0.4 ms
Lead Channel Pacing Threshold Pulse Width: 0.4 ms
Lead Channel Setting Sensing Sensitivity: 4 mV
MDC IDC LEAD IMPLANT DT: 20151111
MDC IDC LEAD LOCATION: 753859
MDC IDC MSMT BATTERY IMPEDANCE: 113 Ohm
MDC IDC MSMT BATTERY VOLTAGE: 2.79 V
MDC IDC MSMT LEADCHNL RA IMPEDANCE VALUE: 448 Ohm
MDC IDC MSMT LEADCHNL RA PACING THRESHOLD AMPLITUDE: 0.75 V
MDC IDC SET LEADCHNL RA PACING AMPLITUDE: 1.5 V
MDC IDC SET LEADCHNL RV PACING AMPLITUDE: 2.25 V
MDC IDC SET LEADCHNL RV PACING PULSEWIDTH: 0.4 ms
MDC IDC STAT BRADY AP VP PERCENT: 21 %

## 2016-07-18 NOTE — Progress Notes (Signed)
Remote pacemaker transmission.   

## 2016-10-04 ENCOUNTER — Other Ambulatory Visit: Payer: Self-pay | Admitting: Internal Medicine

## 2016-10-04 DIAGNOSIS — Z01810 Encounter for preprocedural cardiovascular examination: Secondary | ICD-10-CM

## 2016-10-20 ENCOUNTER — Encounter: Payer: Medicare Other | Admitting: Internal Medicine

## 2016-11-05 ENCOUNTER — Ambulatory Visit (INDEPENDENT_AMBULATORY_CARE_PROVIDER_SITE_OTHER): Payer: Medicare Other | Admitting: Internal Medicine

## 2016-11-05 ENCOUNTER — Encounter: Payer: Self-pay | Admitting: Internal Medicine

## 2016-11-05 VITALS — BP 162/80 | HR 62 | Ht 64.5 in | Wt 193.8 lb

## 2016-11-05 DIAGNOSIS — I480 Paroxysmal atrial fibrillation: Secondary | ICD-10-CM | POA: Diagnosis not present

## 2016-11-05 DIAGNOSIS — Z950 Presence of cardiac pacemaker: Secondary | ICD-10-CM | POA: Diagnosis not present

## 2016-11-05 DIAGNOSIS — I442 Atrioventricular block, complete: Secondary | ICD-10-CM

## 2016-11-05 LAB — CUP PACEART INCLINIC DEVICE CHECK
Battery Impedance: 137 Ohm
Battery Voltage: 2.79 V
Brady Statistic AP VS Percent: 0 %
Implantable Lead Location: 753859
Implantable Lead Model: 5076
Implantable Lead Model: 5076
Lead Channel Pacing Threshold Pulse Width: 0.4 ms
Lead Channel Setting Pacing Amplitude: 2.5 V
Lead Channel Setting Pacing Pulse Width: 0.4 ms
MDC IDC LEAD IMPLANT DT: 20151111
MDC IDC LEAD IMPLANT DT: 20151111
MDC IDC LEAD LOCATION: 753860
MDC IDC MSMT BATTERY REMAINING LONGEVITY: 124 mo
MDC IDC MSMT LEADCHNL RA IMPEDANCE VALUE: 442 Ohm
MDC IDC MSMT LEADCHNL RA PACING THRESHOLD AMPLITUDE: 0.75 V
MDC IDC MSMT LEADCHNL RV IMPEDANCE VALUE: 580 Ohm
MDC IDC MSMT LEADCHNL RV PACING THRESHOLD AMPLITUDE: 1.25 V
MDC IDC MSMT LEADCHNL RV PACING THRESHOLD PULSEWIDTH: 0.4 ms
MDC IDC PG IMPLANT DT: 20151111
MDC IDC SESS DTM: 20171115152058
MDC IDC SET LEADCHNL RA PACING AMPLITUDE: 1.5 V
MDC IDC SET LEADCHNL RV SENSING SENSITIVITY: 4 mV
MDC IDC STAT BRADY AP VP PERCENT: 21 %
MDC IDC STAT BRADY AS VP PERCENT: 77 %
MDC IDC STAT BRADY AS VS PERCENT: 2 %

## 2016-11-05 NOTE — Patient Instructions (Addendum)
Medication Instructions:  Your physician recommends that you continue on your current medications as directed. Please refer to the Current Medication list given to you today.   Labwork: None Ordered   Testing/Procedures: None Ordered   Follow-Up: Your physician wants you to follow-up in: 1 year with Dr. Taylor.  You will receive a reminder letter in the mail two months in advance. If you don't receive a letter, please call our office to schedule the follow-up appointment.  Remote monitoring is used to monitor your ICD from home. This monitoring reduces the number of office visits required to check your device to one time per year. It allows us to keep an eye on the functioning of your device to ensure it is working properly. You are scheduled for a device check from home on 02/04/17. You may send your transmission at any time that day. If you have a wireless device, the transmission will be sent automatically. After your physician reviews your transmission, you will receive a postcard with your next transmission date.    Any Other Special Instructions Will Be Listed Below (If Applicable).     If you need a refill on your cardiac medications before your next appointment, please call your pharmacy.   

## 2016-11-05 NOTE — Progress Notes (Signed)
HPI Lauren Henry returns today for pacemaker follow-up. She is a very pleasant 75 year old woman with complete heart block, paroxysmal atrial fibrillation, status post permanent pacemaker insertion. She had a trivial pericardial effusion. Her dyspnea has improved. She has had no syncope. 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 She has undergone knee replacement surgery. No other complaints today. She is about to travel to [place].      No Known Allergies   Current Outpatient Prescriptions  Medication Sig Dispense Refill  . acetaminophen (TYLENOL) 500 MG tablet Take 1,000 mg by mouth at bedtime as needed (pain). For leg pain    . amLODipine-olmesartan (AZOR) 5-20 MG per tablet Take 1 tablet by mouth daily.     Marland Kitchen. apixaban (ELIQUIS) 5 MG TABS tablet Take 1 tablet (5 mg total) by mouth 2 (two) times daily. 60 tablet 1  . Cholecalciferol (VITAMIN D3) 5000 UNITS CAPS Take 5,000 Units by mouth daily.    . clobetasol cream (TEMOVATE) 0.05 % Apply 1 application topically daily as needed (dry skin on legs).   0  . COREG CR 10 MG 24 hr capsule take 3 capsules by mouth once daily 270 capsule 0  . levothyroxine (SYNTHROID, LEVOTHROID) 100 MCG tablet Take 100 mcg by mouth 2 (two) times a week. Take with a 200 mcg tablet on Mondays and Thursdays for a 300 mcg dose NAME BRAND    . levothyroxine (SYNTHROID, LEVOTHROID) 200 MCG tablet Take 200 mcg by mouth daily. Take with a 100 mcg tablet on Mondays and Thursdays for a 300 mcg dose (all other days take 200 mcg tablet) NAME BRAND    . methocarbamol (ROBAXIN) 500 MG tablet Take 1 tablet (500 mg total) by mouth every 6 (six) hours as needed for muscle spasms. 60 tablet 2  . Multiple Vitamin (MULTIVITAMIN WITH MINERALS) TABS tablet Take 1 tablet by mouth daily.    Marland Kitchen. neomycin-bacitracin-polymyxin (NEOSPORIN) OINT Apply 1 application topically daily as needed for wound care.    . pravastatin (PRAVACHOL) 40 MG tablet Take 40 mg by mouth  daily.     No current facility-administered medications for this visit.      Past Medical History:  Diagnosis Date  . Arthritis   . Atrial fibrillation (HCC)   . Diarrhea    since gallbladder removed  . 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 History of blood transfusion as a child   no abnormal reaction noted  . History of colon polyps   . Hyperlipidemia    takes Pravastatin daily  . Hypertension    takes Azor and Metoprolol daily  . Hypothyroidism    takes Synthroid daily  . Joint pain   . Presence of permanent cardiac pacemaker   . Urinary frequency   . Urinary urgency     ROS:   All systems reviewed and negative except as noted in the HPI.   Past Surgical History:  Procedure Laterality Date  . ABDOMINAL HYSTERECTOMY    . cataract surgery Bilateral   . CHOLECYSTECTOMY  1971  . COLONOSCOPY    . EYE SURGERY    . INSERT / REPLACE / REMOVE PACEMAKER     Medtronic PPM 11/01/14 (Dr. Lewayne BuntingGregg Kelley Knoth)  . JOINT REPLACEMENT Right    knee  . PERMANENT PACEMAKER INSERTION N/A 11/01/2014   Procedure: PERMANENT PACEMAKER INSERTION;  Surgeon: Marinus MawGregg W Adelise Buswell, MD;  [facility];  Service:  Cardiovascular;  Laterality: N/A;  . stomach stapled  1985  . TOTAL KNEE ARTHROPLASTY Left 11/05/2015   Procedure: TOTAL KNEE ARTHROPLASTY;  Surgeon: Dannielle HuhSteve Lucey, MD;  Location: [facility] OR;  Service: Orthopedics;  Laterality: Left;     No family history on file.   Social History   Social History  . Marital status: Married    Spouse name: N/A  . Number of children: N/A  . Years of education: N/A   Occupational History  . Not on file.   Social History Main Topics  . Smoking status: Never Smoker  . Smokeless tobacco: Never Used  . Alcohol use 0.6 oz/week    1 Glasses of wine per week  . Drug use: No  . Sexual activity: Not Currently    Birth control/ protection: Surgical   Other Topics Concern  . Not on file   Social  History Narrative  . No narrative on file     BP (!) 162/80   Pulse 62   Ht 5' 4.5" (1.638 m)   Wt 193 lb 12.8 oz (87.9 kg)   BMI 32.75 kg/m   Physical Exam:  Well appearing 75 year old woman, NAD HEENT: Unremarkable Neck:  6 cm JVD, no thyromegally Back:  No CVA tenderness Lungs:  Clear with no wheezes, rales, or rhonchi. Her pacemaker pocket had no hematoma or drainage. HEART:  Regular rate rhythm, no murmurs, no rubs, no clicks Abd:  soft, positive bowel sounds, no organomegally, no rebound, no guarding Ext:  2 plus pulses, no edema, no cyanosis, no clubbing Skin:  No rashes no nodules Neuro:  CN II through XII intact, motor grossly intact   DEVICE  Normal device function.  See PaceArt for details.   Assess/Plan: 1. Complete heart block - she is doing well, s/p PPM insertion. 2. HTN heart disease - she did not take her meds this morning and she notes that at home her pressures run in the low 130s.  3. PPM - her Medtronic DDD PM is working normally. Will follow. 4. PAF - she is maintaining NSR very nicely. She will continue her current meds.  Leonia ReevesGregg Syerra Abdelrahman,M.D.

## 2017-01-01 ENCOUNTER — Other Ambulatory Visit: Payer: Self-pay | Admitting: Internal Medicine

## 2017-01-01 DIAGNOSIS — I480 Paroxysmal atrial fibrillation: Secondary | ICD-10-CM

## 2017-01-05 ENCOUNTER — Other Ambulatory Visit: Payer: Self-pay | Admitting: Internal Medicine

## 2017-01-05 DIAGNOSIS — Z01810 Encounter for preprocedural cardiovascular examination: Secondary | ICD-10-CM

## 2017-01-06 NOTE — Telephone Encounter (Signed)
Spoke with patient about labs - had labs at Dr. Wylene Simmerisovec [phone] - labs in June 2017.   GM to fax results today.   [phone] - pt's home to call back

## 2017-01-14 ENCOUNTER — Encounter: Payer: Self-pay | Admitting: Internal Medicine

## 2017-02-04 ENCOUNTER — Ambulatory Visit (INDEPENDENT_AMBULATORY_CARE_PROVIDER_SITE_OTHER): Payer: Medicare Other | Admitting: *Deleted

## 2017-02-04 DIAGNOSIS — I442 Atrioventricular block, complete: Secondary | ICD-10-CM | POA: Diagnosis not present

## 2017-02-04 NOTE — Progress Notes (Signed)
Remote pacemaker transmission.   

## 2017-02-05 ENCOUNTER — Encounter: Payer: Self-pay | Admitting: Cardiology

## 2017-02-06 LAB — CUP PACEART REMOTE DEVICE CHECK
Battery Remaining Longevity: 130 mo
Battery Voltage: 2.79 V
Brady Statistic AP VP Percent: 20 %
Brady Statistic AS VP Percent: 76 %
Implantable Lead Implant Date: 20151111
Implantable Lead Location: 753859
Implantable Lead Model: 5076
Implantable Pulse Generator Implant Date: 20151111
Lead Channel Impedance Value: 418 Ohm
Lead Channel Pacing Threshold Amplitude: 0.625 V
Lead Channel Pacing Threshold Pulse Width: 0.4 ms
Lead Channel Setting Pacing Amplitude: 1.5 V
Lead Channel Setting Pacing Amplitude: 2.5 V
Lead Channel Setting Pacing Pulse Width: 0.4 ms
MDC IDC LEAD IMPLANT DT: 20151111
MDC IDC LEAD LOCATION: 753860
MDC IDC MSMT BATTERY IMPEDANCE: 137 Ohm
MDC IDC MSMT LEADCHNL RV IMPEDANCE VALUE: 599 Ohm
MDC IDC MSMT LEADCHNL RV PACING THRESHOLD AMPLITUDE: 1.25 V
MDC IDC MSMT LEADCHNL RV PACING THRESHOLD PULSEWIDTH: 0.4 ms
MDC IDC SESS DTM: 20180214120348
MDC IDC SET LEADCHNL RV SENSING SENSITIVITY: 4 mV
MDC IDC STAT BRADY AP VS PERCENT: 0 %
MDC IDC STAT BRADY AS VS PERCENT: 4 %

## 2017-05-06 ENCOUNTER — Ambulatory Visit (INDEPENDENT_AMBULATORY_CARE_PROVIDER_SITE_OTHER): Payer: Medicare Other | Admitting: *Deleted

## 2017-05-06 DIAGNOSIS — I442 Atrioventricular block, complete: Secondary | ICD-10-CM | POA: Diagnosis not present

## 2017-05-06 LAB — CUP PACEART REMOTE DEVICE CHECK
Battery Impedance: 137 Ohm
Battery Voltage: 2.79 V
Brady Statistic AP VS Percent: 0 %
Brady Statistic AS VP Percent: 79 %
Brady Statistic AS VS Percent: 2 %
Date Time Interrogation Session: 20180516115141
Implantable Lead Implant Date: 20151111
Implantable Lead Location: 753860
Implantable Lead Model: 5076
Implantable Lead Model: 5076
Lead Channel Impedance Value: 569 Ohm
Lead Channel Pacing Threshold Amplitude: 0.625 V
Lead Channel Pacing Threshold Pulse Width: 0.4 ms
Lead Channel Pacing Threshold Pulse Width: 0.4 ms
MDC IDC LEAD IMPLANT DT: 20151111
MDC IDC LEAD LOCATION: 753859
MDC IDC MSMT BATTERY REMAINING LONGEVITY: 129 mo
MDC IDC MSMT LEADCHNL RA IMPEDANCE VALUE: 430 Ohm
MDC IDC MSMT LEADCHNL RV PACING THRESHOLD AMPLITUDE: 1.375 V
MDC IDC PG IMPLANT DT: 20151111
MDC IDC SET LEADCHNL RA PACING AMPLITUDE: 1.5 V
MDC IDC SET LEADCHNL RV PACING AMPLITUDE: 2.5 V
MDC IDC SET LEADCHNL RV PACING PULSEWIDTH: 0.4 ms
MDC IDC SET LEADCHNL RV SENSING SENSITIVITY: 4 mV
MDC IDC STAT BRADY AP VP PERCENT: 19 %

## 2017-05-06 NOTE — Progress Notes (Signed)
Remote pacemaker transmission.   

## 2017-05-07 ENCOUNTER — Encounter: Payer: Self-pay | Admitting: Cardiology

## 2017-07-08 ENCOUNTER — Other Ambulatory Visit: Payer: Self-pay | Admitting: Internal Medicine

## 2017-07-08 DIAGNOSIS — I480 Paroxysmal atrial fibrillation: Secondary | ICD-10-CM

## 2017-08-05 ENCOUNTER — Encounter: Payer: Medicare Other | Admitting: *Deleted

## 2017-08-07 ENCOUNTER — Encounter: Payer: Medicare Other | Admitting: *Deleted

## 2017-09-18 ENCOUNTER — Other Ambulatory Visit: Payer: Self-pay | Admitting: Internal Medicine

## 2017-09-18 DIAGNOSIS — Z01810 Encounter for preprocedural cardiovascular examination: Secondary | ICD-10-CM

## 2017-10-23 ENCOUNTER — Telehealth: Payer: Self-pay | Admitting: Internal Medicine

## 2017-10-23 NOTE — Telephone Encounter (Signed)
Pt c/o BP issue: STAT if pt c/o blurred vision, one-sided weakness or slurred speech  1. What are your last 5 BP readings? 193/102    174/93    145/86   2. Are you having any other symptoms (ex. Dizziness, headache, blurred vision, passed out)?  Dizziness  3. What is your BP issue? high

## 2017-10-23 NOTE — Telephone Encounter (Signed)
Call returned to Pt.  Per Pt she has been having high blood pressures for awhile (?) she had called her PCP who had asked her to double her Azor (2 tabs daily).  Per Pt states the increased Azor has not decreased her high blood pressure.   Spoke with EP APP, will have Pt increase her Coreg Cr to 40 mg (4 tablets) daily.  Asked Pt to take 2 tabs in the am and 2 tabs in the pm and continue to monitor her blood pressure.  Notified Pt to call office if her blood pressure does not come down.  Notified Pt that if BP gets too low, she should go back to Azor 1 tablet daily.  Pt has follow up with Dr. Ladona Ridgelaylor 11/04/17, will have him review pt medications at that time.

## 2017-11-04 ENCOUNTER — Ambulatory Visit: Payer: Medicare Other | Admitting: Internal Medicine

## 2017-11-04 ENCOUNTER — Encounter: Payer: Self-pay | Admitting: Internal Medicine

## 2017-11-04 ENCOUNTER — Encounter (INDEPENDENT_AMBULATORY_CARE_PROVIDER_SITE_OTHER): Payer: Self-pay

## 2017-11-04 VITALS — BP 138/72 | HR 65 | Ht 64.5 in | Wt 187.4 lb

## 2017-11-04 DIAGNOSIS — I442 Atrioventricular block, complete: Secondary | ICD-10-CM | POA: Diagnosis not present

## 2017-11-04 DIAGNOSIS — Z950 Presence of cardiac pacemaker: Secondary | ICD-10-CM | POA: Diagnosis not present

## 2017-11-04 DIAGNOSIS — I10 Essential (primary) hypertension: Secondary | ICD-10-CM

## 2017-11-04 MED ORDER — AMLODIPINE-OLMESARTAN 5-20 MG PO TABS: 2.0000 | ORAL_TABLET | Freq: Every day | ORAL | 3 refills | Status: DC

## 2017-11-04 MED ORDER — CARVEDILOL PHOSPHATE ER 10 MG PO CP24: 20.0000 mg | ORAL_CAPSULE | Freq: Two times a day (BID) | ORAL | 3 refills | Status: DC

## 2017-11-04 MED ORDER — CARVEDILOL PHOSPHATE ER 10 MG PO CP24: 20.0000 mg | ORAL_CAPSULE | Freq: Two times a day (BID) | ORAL | Status: DC

## 2017-11-04 NOTE — Progress Notes (Signed)
HPI [name] returns today for ongoing evaluation and management of complete heart block, hypertension, and obesity. She is status post pacemaker insertion. In the interim, she has been bothered by difficult to control high blood pressure. She carries a log today which demonstrates that her systolic blood pressures are elevated, particularly at nighttime. She is on a fairly large list of medications. In addition, the patient notes that her thyroid is been abnormal. No Known Allergies   Current Outpatient Medications  Medication Sig Dispense Refill  . acetaminophen (TYLENOL) 500 MG tablet Take 1,000 mg by mouth at bedtime as needed (pain). For leg pain    . Cholecalciferol (VITAMIN D3) 5000 UNITS CAPS Take 5,000 Units by mouth daily.    . clobetasol cream (TEMOVATE) 0.05 % Apply 1 application topically daily as needed (dry skin on legs).   0  . ELIQUIS 5 MG TABS tablet take 1 tablet by mouth twice a day 180 tablet 4  . levothyroxine (SYNTHROID, LEVOTHROID) 150 MCG tablet Take 150 mcg by mouth. Take 1 tablet by mouth and take an extra 1/2 tablet on two days  0  . Multiple Vitamin (MULTIVITAMIN WITH MINERALS) TABS tablet Take 1 tablet by mouth daily.    . mupirocin ointment (BACTROBAN) 2 % Apply 1 application as needed topically.  0  . neomycin-bacitracin-polymyxin (NEOSPORIN) OINT Apply 1 application topically daily as needed for wound care.    . pravastatin (PRAVACHOL) 40 MG tablet Take 40 mg by mouth daily.    Marland Kitchen. amLODipine-olmesartan (AZOR) 5-20 MG tablet Take 2 tablets daily by mouth. 180 tablet 3  . carvedilol (COREG CR) 10 MG 24 hr capsule Take 2 capsules (20 mg total) 2 (two) times daily by mouth. 360 capsule 3   No current facility-administered medications for this visit.      Past Medical History:  Diagnosis Date  . Arthritis   . Atrial fibrillation (HCC)   . Diarrhea    since gallbladder removed  . Dysrhythmia    left BBB '05, developed CHB 10/2014 s/p Medtronic PPM;  atrial fib (PAF)  . History of blood transfusion as a child   no abnormal reaction noted  . History of colon polyps   . Hyperlipidemia    takes Pravastatin daily  . Hypertension    takes Azor and Metoprolol daily  . Hypothyroidism    takes Synthroid daily  . Joint pain   . Presence of permanent cardiac pacemaker   . Urinary frequency   . Urinary urgency     ROS:   All systems reviewed and negative except as noted in the HPI.   Past Surgical History:  Procedure Laterality Date  . ABDOMINAL HYSTERECTOMY    . cataract surgery Bilateral   . CHOLECYSTECTOMY  1971  . COLONOSCOPY    . EYE SURGERY    . INSERT / REPLACE / REMOVE PACEMAKER     Medtronic PPM 11/01/14 (Dr. Lewayne BuntingGregg Taylor)  . JOINT REPLACEMENT Right    knee  . stomach stapled  1985     History reviewed. No pertinent family history.   Social History   Socioeconomic History  . Marital status: Married    Spouse name: Not on file  . Number of children: Not on file  . Years of education: Not on file  . Highest education level: Not on file  Social Needs  . Financial resource strain: Not on file  . Food insecurity - worry: Not on file  . Food insecurity -  inability: Not on file  . Transportation needs - medical: Not on file  . Transportation needs - non-medical: Not on file  Occupational History  . Not on file  Tobacco Use  . Smoking status: Never Smoker  . Smokeless tobacco: Never Used  Substance and Sexual Activity  . Alcohol use: Yes    Alcohol/week: 0.6 oz    Types: 1 Glasses of wine per week  . Drug use: No  . Sexual activity: Not Currently    Birth control/protection: Surgical  Other Topics Concern  . Not on file  Social History Narrative  . Not on file     BP 138/72   Pulse 65   Ht 5' 4.5" (1.638 m)   Wt 187 lb 6.4 oz (85 kg)   SpO2 99%   BMI 31.67 kg/m   Physical Exam:  Well appearing obese, elderly woman, NAD HEENT: Unremarkable Neck:  No JVD, no thyromegally Lymphatics:  No  adenopathy Back:  No CVA tenderness Lungs:  Clear, with no wheezes, rales, or rhonchi. Well-healed pacemaker incision HEART:  Regular rate rhythm, no murmurs, no rubs, no clicks Abd:  soft, positive bowel sounds, no organomegally, no rebound, no guarding Ext:  2 plus pulses, no edema, no cyanosis, no clubbing Skin:  No rashes no nodules Neuro:  CN II through XII intact, motor grossly intact  EKG - normal sinus rhythm with AV sequential pacing  DEVICE  Normal device function.  See PaceArt for details.   Assess/Plan: 1. Complete heart block - she is asymptomatic status post pacemaker insertion. No escape today. 2. Pacemaker - her Medtronic dual-chamber pacemaker is working normally. She has approximately 10 years of battery longevity. She is pacing approximately 99% of the time in the ventricle. 3. Obesity - despite her advanced age, I've asked the patient to try and lose weight. 4. Hypertension - her blood pressure has been elevated in the evening. She was taking long-acting carvedilol in the morning. I've asked her to break up her dosed and take 20 mg in the morning and 20 mg in the evening. In addition she is encouraged to reduce her salt intake.  Lewayne BuntingGregg Taylor, M.D.

## 2017-11-04 NOTE — Patient Instructions (Signed)
Medication Instructions:  Your physician recommends that you continue on your current medications as directed. Please refer to the Current Medication list given to you today.  Labwork: None ordered.  Testing/Procedures: None ordered.  Follow-Up: Your physician wants you to follow-up in: one year with Dr. Ladona Ridgelaylor.   You will receive a reminder letter in the mail two months in advance. If you don't receive a letter, please call our office to schedule the follow-up appointment.  Remote monitoring is used to monitor your Pacemaker from home. This monitoring reduces the number of office visits required to check your device to one time per year. It allows us to keep an eye on the functioning of your device to ensure it is working properly. You are scheduled for a device check from home on 02/03/2017. You may send your transmission at any time that day. If you have a wireless device, the transmission will be sent automatically. After your physician reviews your transmission, you will receive a postcard with your next transmission date.   Any Other Special Instructions Will Be Listed Below (If Applicable).   If you need a refill on your cardiac medications before your next appointment, please call your pharmacy.

## 2017-11-06 LAB — CUP PACEART INCLINIC DEVICE CHECK
Battery Voltage: 2.79 V
Brady Statistic AP VS Percent: 0 %
Brady Statistic AS VS Percent: 1 %
Date Time Interrogation Session: 20181114201759
Implantable Lead Implant Date: 20151111
Implantable Lead Implant Date: 20151111
Implantable Lead Location: 753859
Lead Channel Impedance Value: 626 Ohm
Lead Channel Pacing Threshold Amplitude: 0.75 V
Lead Channel Pacing Threshold Pulse Width: 0.4 ms
Lead Channel Pacing Threshold Pulse Width: 0.4 ms
Lead Channel Setting Pacing Amplitude: 1.5 V
Lead Channel Setting Pacing Amplitude: 2.5 V
Lead Channel Setting Sensing Sensitivity: 4 mV
MDC IDC LEAD LOCATION: 753860
MDC IDC MSMT BATTERY IMPEDANCE: 161 Ohm
MDC IDC MSMT BATTERY REMAINING LONGEVITY: 125 mo
MDC IDC MSMT LEADCHNL RA IMPEDANCE VALUE: 467 Ohm
MDC IDC MSMT LEADCHNL RA SENSING INTR AMPL: 1.4 mV
MDC IDC MSMT LEADCHNL RV PACING THRESHOLD AMPLITUDE: 1.25 V
MDC IDC PG IMPLANT DT: 20151111
MDC IDC SET LEADCHNL RV PACING PULSEWIDTH: 0.4 ms
MDC IDC STAT BRADY AP VP PERCENT: 22 %
MDC IDC STAT BRADY AS VP PERCENT: 77 %

## 2017-12-18 ENCOUNTER — Ambulatory Visit: Payer: Medicare Other | Admitting: Cardiovascular Disease

## 2017-12-18 ENCOUNTER — Encounter: Payer: Self-pay | Admitting: Cardiovascular Disease

## 2017-12-18 VITALS — BP 143/77 | HR 63 | Ht 65.0 in | Wt 189.0 lb

## 2017-12-18 DIAGNOSIS — I442 Atrioventricular block, complete: Secondary | ICD-10-CM

## 2017-12-18 DIAGNOSIS — I10 Essential (primary) hypertension: Secondary | ICD-10-CM

## 2017-12-18 MED ORDER — CLONIDINE HCL 0.1 MG PO TABS: ORAL_TABLET | ORAL | 11 refills | Status: DC

## 2017-12-18 NOTE — Progress Notes (Signed)
Cardiology Office Note   Date:  12/18/2017   ID:  Lauren HitchSandra D Yarbrough, DOB 03/23/1941, MRN [id number]  PCP:  Gaspar Garbeisovec, Richard W, MD  Cardiologist:   Chilton Siiffany [place], MD   No chief complaint on file.    History of Present Illness: Lauren Henry is a 76 y.o. female with complete heart block s/p PPM and hypertension who is being seen today for the evaluation of hypertension at the request of Tisovec, Adelfa Kohichard W, MD.  Lauren Henry had a pacemaker implanted by Dr. Ladona Ridgelaylor in 2015 due to complete heart block.  She was minimally symptomatic and reports that it was incidentally found when she presented for knee surgery.  Since that time she has been doing well.  She continues to have difficult to manage hypertension.  She last followed up with Dr. Ladona Ridgelaylor on 10/2017 and her blood pressure was elevated.  Carvedilol was switched to twice daily dosing from once daily long-acting dosing. However she reports that she has been taking all her carvedilol in the AM.  Her BP has been mostly within range in the moring and increases throughout the day.  It is typically around 120 systolic in the AM.  She has been working with Dr. Wylene Simmerisovec and takes clonidine as needed for SBP >160 mmHg.  She has been taking this most days in the evening because her average BP has ranged from the 150s-180s.  She saw him 10/2017 and there was concern that anxiety may be contributing, as her AM BP is well controlled and it seems to increase throughout the day.  She was prescribed clonazepam but hasn't seen any improvement in her BP since starting this medication.  She notes that when her BP is very elevate she is unable to sleep.  She denies chest pain or shortness of breath.  Lauren Henry is very physically active.  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  She denies lower extremity edema, orthopnea or PND.     Past Medical History:  Diagnosis Date  . Arthritis   . Atrial fibrillation (HCC)   . Diarrhea     since gallbladder removed  . 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 History of blood transfusion as a child   no abnormal reaction noted  . History of colon polyps   . Hyperlipidemia    takes Pravastatin daily  . Hypertension    takes Azor and Metoprolol daily  . Hypothyroidism    takes Synthroid daily  . Joint pain   . Presence of permanent cardiac pacemaker   . Urinary frequency   . Urinary urgency     Past Surgical History:  Procedure Laterality Date  . ABDOMINAL HYSTERECTOMY    . cataract surgery Bilateral   . CHOLECYSTECTOMY  1971  . COLONOSCOPY    . EYE SURGERY    . INSERT / REPLACE / REMOVE PACEMAKER     Medtronic PPM 11/01/14 (Dr. Lewayne BuntingGregg Taylor)  . JOINT REPLACEMENT Right    knee  . PERMANENT PACEMAKER INSERTION N/A 11/01/2014   Procedure: PERMANENT PACEMAKER INSERTION;  Surgeon: Marinus MawGregg W Taylor, MD;  [facility];  Service: Cardiovascular;  Laterality: N/A;  . stomach stapled  1985  . TOTAL KNEE ARTHROPLASTY Left 11/05/2015   Procedure: TOTAL KNEE ARTHROPLASTY;  Surgeon: Dannielle HuhSteve Lucey, MD;  Location: [facility] OR;  Service: Orthopedics;  Laterality: Left;  Current Outpatient Medications  Medication Sig Dispense Refill  . acetaminophen (TYLENOL) 500 MG tablet Take 1,000 mg by mouth at bedtime as needed (pain). For leg pain    . amLODipine-olmesartan (AZOR) 5-20 MG tablet Take 2 tablets daily by mouth. 180 tablet 3  . carvedilol (COREG CR) 10 MG 24 hr capsule Take 2 capsules (20 mg total) 2 (two) times daily by mouth. 360 capsule 3  . Cholecalciferol (VITAMIN D3) 5000 UNITS CAPS Take 5,000 Units by mouth daily.    . cloNIDine (CATAPRES) 0.1 MG tablet Take 1 tablet at lunch time and 1 tablet at bedtime 60 tablet 11  . ELIQUIS 5 MG TABS tablet take 1 tablet by mouth twice a day 180 tablet 4  . levothyroxine (SYNTHROID, LEVOTHROID) 150 MCG tablet Take 150 mcg by mouth. Take 1 tablet by mouth and take an extra 1/2  tablet on two days  0  . Multiple Vitamin (MULTIVITAMIN WITH MINERALS) TABS tablet Take 1 tablet by mouth daily.    . pravastatin (PRAVACHOL) 40 MG tablet Take 40 mg by mouth daily.    Marland Kitchen spironolactone (ALDACTONE) 25 MG tablet Take 1 tablet by mouth daily.  0   No current facility-administered medications for this visit.     Allergies:   Patient has no known allergies.    Social History:  The patient  reports that  has never smoked. she has never used smokeless tobacco. She reports that she drinks about 0.6 oz of alcohol per week. She reports that she does not use drugs.   Family History:  The patient's family history includes Heart attack in her father; Heart disease in her father; Stroke in her sister.    ROS:  Please see the history of present illness.   Otherwise, review of systems are positive for none.   All other systems are reviewed and negative.    PHYSICAL EXAM: VS:  BP (!) 143/77   Pulse 63   Ht 5\' 5"  (1.651 m)   Wt 189 lb (85.7 kg)   SpO2 100%   BMI 31.45 kg/m  , BMI Body mass index is 31.45 kg/m. GENERAL:  Well appearing HEENT:  Pupils equal round and reactive, fundi not visualized, oral mucosa unremarkable NECK:  No jugular venous distention, waveform within normal limits, carotid upstroke brisk and symmetric, no bruits, no thyromegaly LUNGS:  Clear to auscultation bilaterally HEART:  RRR.  PMI not displaced or sustained,S1 and S2 within normal limits, no S3, no S4, no clicks, no rubs, no murmurs ABD:  Flat, positive bowel sounds normal in frequency in pitch, no bruits, no rebound, no guarding, no midline pulsatile mass, no hepatomegaly, no splenomegaly EXT:  2 plus pulses throughout, no edema, no cyanosis no clubbing SKIN:  No rashes no nodules NEURO:  Cranial nerves II through XII grossly intact, motor grossly intact throughout PSYCH:  Cognitively intact, oriented to person place and time   EKG:  EKG is not ordered today.  Echo 10/30/14: Study  Conclusions  - Left ventricle: The cavity size was normal. There was mild focal basal hypertrophy of the septum. Systolic function was normal. The estimated ejection fraction was in the range of 60% to 65%. Wall motion was normal; there were no regional wall motion abnormalities. - Aortic valve: Trileaflet; mildly thickened, mildly calcified leaflets. Transvalvular velocity was minimally increased. There was no stenosis. - Mitral valve: Calcified annulus. There was mild regurgitation. - Right ventricle: The cavity size was mildly dilated. Wall thickness was normal.  Recent Labs: No results found for requested labs within last 8760 hours.    Lipid Panel    Component Value Date/Time   CHOL 126 10/31/2014 0214   TRIG 83 10/31/2014 0214   HDL 49 10/31/2014 0214   CHOLHDL 2.6 10/31/2014 0214   VLDL 17 10/31/2014 0214   LDLCALC 60 10/31/2014 0214      Wt Readings from Last 3 Encounters:  12/18/17 189 lb (85.7 kg)  11/04/17 187 lb 6.4 oz (85 kg)  11/05/16 193 lb 12.8 oz (87.9 kg)      ASSESSMENT AND PLAN:  # Hypertension: Blood pressure is poorly-controlled. It seems good in the morning and increases throughout the day.  It seems to decrease well with clonidine.  We will start clonidine 0.1mg  with lunch and bedtime.  Continue amlodipine, olmesartane and spironolactone.  # CHB s/p PPM: Managed by Dr. Ladona Ridgelaylor.  Device stable 10/2017.   Current medicines are reviewed at length with the patient today.  The patient does not have concerns regarding medicines.  The following changes have been made:  no change  Labs/ tests ordered today include:  No orders of the defined types were placed in this encounter.    Disposition:   FU with Eilidh Marcano C. Duke Salviaandolph, MD, [facility] in 3 months.  PharmD in 2 weeks.   Time spent: 30 minutes-Greater than 50% of this time was spent in counseling, explanation of diagnosis, planning of further management, and coordination of  care.   This note was written with the assistance of speech recognition software.  Please excuse any transcriptional errors.  Signed, Duran Ohern C. Duke Salviaandolph, MD, [facility]  12/18/2017 10:28 PM    [facility]

## 2017-12-18 NOTE — Patient Instructions (Signed)
Medication changes   --start taking again Clonidine 0.1mg   Twice a day -- tablet at lunch time and a tablet at bedtime.  No other changes    Your physician recommends that you schedule a follow-up appointment in 2 weeks with CV[facility]- BLOOD PRESSURE     Your physician recommends that you schedule a follow-up appointment in 3 MONTHS WITH DR [place].    If you need a refill on your cardiac medications before your next appointment, please call your pharmacy.

## 2017-12-21 DIAGNOSIS — R3915 Urgency of urination: Secondary | ICD-10-CM | POA: Insufficient documentation

## 2017-12-21 DIAGNOSIS — R197 Diarrhea, unspecified: Secondary | ICD-10-CM | POA: Insufficient documentation

## 2017-12-21 DIAGNOSIS — Z860100 Personal history of colon polyps, unspecified: Secondary | ICD-10-CM | POA: Insufficient documentation

## 2017-12-21 DIAGNOSIS — R350 Frequency of micturition: Secondary | ICD-10-CM | POA: Insufficient documentation

## 2017-12-21 DIAGNOSIS — Z950 Presence of cardiac pacemaker: Secondary | ICD-10-CM | POA: Insufficient documentation

## 2017-12-21 DIAGNOSIS — M1990 Unspecified osteoarthritis, unspecified site: Secondary | ICD-10-CM | POA: Insufficient documentation

## 2017-12-21 DIAGNOSIS — M2550 Pain in unspecified joint: Secondary | ICD-10-CM | POA: Insufficient documentation

## 2017-12-21 DIAGNOSIS — Z9289 Personal history of other medical treatment: Secondary | ICD-10-CM | POA: Insufficient documentation

## 2017-12-21 DIAGNOSIS — I499 Cardiac arrhythmia, unspecified: Secondary | ICD-10-CM | POA: Insufficient documentation

## 2018-01-03 NOTE — Progress Notes (Signed)
Patient ID: Lauren Henry                 DOB: 01/07/1941                      MRN: [id number]     HPI: Lauren Henry is a 77 y.o. female referred by Dr. Duke Salvia to HTN clinic. PMHI includes complete heart block, atrial fibrillation, hypothyroidism, and hypertension. Blood pressure usually well control in the morning but increases throughout the day. Dr Duke Salvia stated clonidine 0.1mg  twice daily on 12/18/2017 for better BP control. Patient does not believe her clonidine is helping to control her blood pressure. Patient believes her anxiety is causing her to have high blood pressures in the evening. She is currently taking clonazepam 0.5 mg once daily (per PCP orders) to control her blood pressure.  She denies hypotension, increased fatigue or shortness of breath. She has mild peripheral edema, but nothing bothersome.  Current HTN meds:  Amlodipine-olmesartan 10-40mg  every monring Carvedilol CR  40mg  every morning Clonidine 0.1mg  twice daily (lunch and bedtime) Spironolactone 25mg  every morning  Previously tried: none   BP goal: 130/80  Family History: includes Hypertension and Heart attack in her father; Heart disease in her father; Stroke in her sister; heart disease in her grandmother  Social History: reports that has never smoked. she has never used smokeless tobacco. She reports that she drinks wine several nights a week with dinner. She reports that she does not use drugs.   Diet: She does not follow a low sodium diet. She drinks several cups of coffee in the morning, but does not drink soft drinks.  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   Home BP readings:  15 morning readings; average 126/70 26 evening readings; average 149/72  Wt Readings from Last 3 Encounters:  12/18/17 189 lb (85.7 kg)  11/04/17 187 lb 6.4 oz (85 kg)  11/05/16 193 lb 12.8 oz (87.9 kg)   BP Readings from Last 3 Encounters:  01/04/18 (!) 146/70  12/18/17  (!) 143/77  11/04/17 138/72   Pulse Readings from Last 3 Encounters:  01/04/18 60  12/18/17 63  11/04/17 65    Past Medical History:  Diagnosis Date  . Arthritis   . Atrial fibrillation (HCC)   . Diarrhea    since gallbladder removed  . 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 History of blood transfusion as a child   no abnormal reaction noted  . History of colon polyps   . Hyperlipidemia    takes Pravastatin daily  . Hypertension    takes Azor and Metoprolol daily  . Hypothyroidism    takes Synthroid daily  . Joint pain   . Presence of permanent cardiac pacemaker   . Urinary frequency   . Urinary urgency     Current Outpatient Medications on File Prior to Visit  Medication Sig Dispense Refill  . acetaminophen (TYLENOL) 500 MG tablet Take 1,000 mg by mouth at bedtime as needed (pain). For leg pain    . amLODipine-olmesartan (AZOR) 5-20 MG tablet Take 2 tablets daily by mouth. 180 tablet 3  . carvedilol (COREG CR) 10 MG 24 hr capsule Take 2 capsules (20 mg total) 2 (two) times daily by mouth. 360 capsule 3  . Cholecalciferol (VITAMIN D3) 5000 UNITS CAPS Take 5,000 Units by mouth daily.    Marland Kitchen  cloNIDine (CATAPRES) 0.1 MG tablet Take 1 tablet at lunch time and 1 tablet at bedtime 60 tablet 11  . ELIQUIS 5 MG TABS tablet take 1 tablet by mouth twice a day 180 tablet 4  . levothyroxine (SYNTHROID, LEVOTHROID) 150 MCG tablet Take 150 mcg by mouth. Take 1 tablet by mouth and take an extra 1/2 tablet on two days  0  . Multiple Vitamin (MULTIVITAMIN WITH MINERALS) TABS tablet Take 1 tablet by mouth daily.    . pravastatin (PRAVACHOL) 40 MG tablet Take 40 mg by mouth daily.    Marland Kitchen. spironolactone (ALDACTONE) 25 MG tablet Take 1 tablet by mouth daily.  0   No current facility-administered medications on file prior to visit.     No Known Allergies  Blood pressure (!) 146/70, pulse 60, SpO2 98 %.  Hypertension According to the 2017  Hypertension Guidelines, patient's blood pressure is controlled in the morning, but not controlled in the evening. Patient's blood pressure goal is <130/80. Patient's anxiety is believed to be contributing to her high blood pressures at night. Will start taking her carvedilol 40 mg at lunch together with clonidine 0.1mg  in an effort  to lower her blood pressures in the evening. Patient is currently not following a low sodium diet. Patient is going to start to limit her salt intake as much as possible. Recommended to discussed further titration of clonazepam dose during follow up with Dr. Wylene Simmerisovec on January 31st.. Will follow up with Dr. Duke Salviaandolph in March as previously schedule and with HTN clinic as needed.   Jacqualine Codeebecca Dey PharmD Candidate [facility] [employer]   Plan and assessment discussed and approved by :  Pearletha Furlaquel Rodriguez-Guzman PharmD, BCPS, CPP [facility] [address] [place] [phone] 01/05/2018 8:09 AM

## 2018-01-04 ENCOUNTER — Ambulatory Visit (INDEPENDENT_AMBULATORY_CARE_PROVIDER_SITE_OTHER): Payer: Medicare Other | Admitting: Pharmacist

## 2018-01-04 VITALS — BP 146/70 | HR 60

## 2018-01-04 DIAGNOSIS — I10 Essential (primary) hypertension: Secondary | ICD-10-CM

## 2018-01-04 NOTE — Assessment & Plan Note (Addendum)
According to the 2017 Hypertension Guidelines, patient's blood pressure is controlled in the morning, but not controlled in the evening. Patient's blood pressure goal is <130/80. Patient's anxiety is believed to be contributing to her high blood pressures at night. Will start taking her carvedilol 40 mg at lunch together with clonidine 0.1mg  in an effort  to lower her blood pressures in the evening. Patient is currently not following a low sodium diet. Patient is going to start to limit her salt intake as much as possible. Recommended to discussed further titration of clonazepam dose during follow up with Dr. Wylene Simmerisovec on January 31st.. Will follow up with Dr. Duke Salviaandolph in March as previously schedule and with HTN clinic as needed.

## 2018-01-04 NOTE — Patient Instructions (Addendum)
Return for a  follow up appointment as previously schedule with DR Duke Salviaandolph  Check your blood pressure at home daily (if able) and keep record of the readings.  Take your BP meds as follows:  *Morning blood pressure medication* Amlodipine-olmesartan 10-40mg  daily Spironolactone 25mg  daily  *Lunch time BP medication* Carvedilol CR  40mg  daily Clonidine 0.1mg  lunch  *Dinner/Supper* Clonidine 0.1mg    Take as needed for blod pressure/anxiety Clonazepam 0.5mg    Bring all of your meds, your BP cuff and your record of home blood pressures to your next appointment.  Exercise as you're able, try to walk approximately 30 minutes per day.  Keep salt intake to a minimum, especially watch canned and prepared boxed foods.  Eat more fresh fruits and vegetables and fewer canned items.  Avoid eating in fast food restaurants.    HOW TO TAKE YOUR BLOOD PRESSURE: . Rest 5 minutes before taking your blood pressure. .  Don't smoke or drink caffeinated beverages for at least 30 minutes before. . Take your blood pressure before (not after) you eat. . Sit comfortably with your back supported and both feet on the floor (don't cross your legs). . Elevate your arm to heart level on a table or a desk. . Use the proper sized cuff. It should fit smoothly and snugly around your bare upper arm. There should be enough room to slip a fingertip under the cuff. The bottom edge of the cuff should be 1 inch above the crease of the elbow. . Ideally, take 3 measurements at one sitting and record the average.     DASH Eating Plan DASH stands for "Dietary Approaches to Stop Hypertension." The DASH eating plan is a healthy eating plan that has been shown to reduce high blood pressure (hypertension). It may also reduce your risk for type 2 diabetes, heart disease, and stroke. The DASH eating plan may also help with weight loss. What are tips for following this plan? General guidelines  Avoid eating more than 2,300 mg  (milligrams) of salt (sodium) a day. If you have hypertension, you may need to reduce your sodium intake to 1,500 mg a day.  Limit alcohol intake to no more than 1 drink a day for nonpregnant women and 2 drinks a day for men. One drink equals 12 oz of beer, 5 oz of wine, or 1 oz of hard liquor.  Work with your health care provider to maintain a healthy body weight or to lose weight. Ask what an ideal weight is for you.  Get at least 30 minutes of exercise that causes your heart to beat faster (aerobic exercise) most days of the week. Activities may include walking, swimming, or biking.  Work with your health care provider or diet and nutrition specialist (dietitian) to adjust your eating plan to your individual calorie needs. Reading food labels  Check food labels for the amount of sodium per serving. Choose foods with less than 5 percent of the Daily Value of sodium. Generally, foods with less than 300 mg of sodium per serving fit into this eating plan.  To find whole grains, look for the word "whole" as the first word in the ingredient list. Shopping  Buy products labeled as "low-sodium" or "no salt added."  Buy fresh foods. Avoid canned foods and premade or frozen meals. Cooking  Avoid adding salt when cooking. Use salt-free seasonings or herbs instead of table salt or sea salt. Check with your health care provider or pharmacist before using salt substitutes.  Do  not fry foods. Cook foods using healthy methods such as baking, boiling, grilling, and broiling instead.  Cook with heart-healthy oils, such as olive, canola, soybean, or sunflower oil. Meal planning   Eat a balanced diet that includes: ? 5 or more servings of fruits and vegetables each day. At each meal, try to fill half of your plate with fruits and vegetables. ? Up to 6-8 servings of whole grains each day. ? Less than 6 oz of lean meat, poultry, or fish each day. A 3-oz serving of meat is about the same size as a deck  of cards. One egg equals 1 oz. ? 2 servings of low-fat dairy each day. ? A serving of nuts, seeds, or beans 5 times each week. ? Heart-healthy fats. Healthy fats called Omega-3 fatty acids are found in foods such as flaxseeds and coldwater fish, like sardines, salmon, and mackerel.  Limit how much you eat of the following: ? Canned or prepackaged foods. ? Food that is high in trans fat, such as fried foods. ? Food that is high in saturated fat, such as fatty meat. ? Sweets, desserts, sugary drinks, and other foods with added sugar. ? Full-fat dairy products.  Do not salt foods before eating.  Try to eat at least 2 vegetarian meals each week.  Eat more home-cooked food and less restaurant, buffet, and fast food.  When eating at a restaurant, ask that your food be prepared with less salt or no salt, if possible. What foods are recommended? The items listed may not be a complete list. Talk with your dietitian about what dietary choices are best for you. Grains Whole-grain or whole-wheat bread. Whole-grain or whole-wheat pasta. Brown rice. Orpah Cobb. Bulgur. Whole-grain and low-sodium cereals. Pita bread. Low-fat, low-sodium crackers. Whole-wheat flour tortillas. Vegetables Fresh or frozen vegetables (raw, steamed, roasted, or grilled). Low-sodium or reduced-sodium tomato and vegetable juice. Low-sodium or reduced-sodium tomato sauce and tomato paste. Low-sodium or reduced-sodium canned vegetables. Fruits All fresh, dried, or frozen fruit. Canned fruit in natural juice (without added sugar). Meat and other protein foods Skinless chicken or [place]. Ground chicken or [place]. Pork with fat trimmed off. Fish and seafood. Egg whites. Dried beans, peas, or lentils. Unsalted nuts, nut butters, and seeds. Unsalted canned beans. Lean cuts of beef with fat trimmed off. Low-sodium, lean deli meat. Dairy Low-fat (1%) or fat-free (skim) milk. Fat-free, low-fat, or reduced-fat cheeses. Nonfat,  low-sodium ricotta or cottage cheese. Low-fat or nonfat yogurt. Low-fat, low-sodium cheese. Fats and oils Soft margarine without trans fats. Vegetable oil. Low-fat, reduced-fat, or light mayonnaise and salad dressings (reduced-sodium). Canola, safflower, olive, soybean, and sunflower oils. Avocado. Seasoning and other foods Herbs. Spices. Seasoning mixes without salt. Unsalted popcorn and pretzels. Fat-free sweets. What foods are not recommended? The items listed may not be a complete list. Talk with your dietitian about what dietary choices are best for you. Grains Baked goods made with fat, such as croissants, muffins, or some breads. Dry pasta or rice meal packs. Vegetables Creamed or fried vegetables. Vegetables in a cheese sauce. Regular canned vegetables (not low-sodium or reduced-sodium). Regular canned tomato sauce and paste (not low-sodium or reduced-sodium). Regular tomato and vegetable juice (not low-sodium or reduced-sodium). Rosita Fire. Olives. Fruits Canned fruit in a light or heavy syrup. Fried fruit. Fruit in cream or butter sauce. Meat and other protein foods Fatty cuts of meat. Ribs. Fried meat. Tomasa Blase. Sausage. Bologna and other processed lunch meats. Salami. Fatback. Hotdogs. Bratwurst. Salted nuts and seeds. Canned beans with  added salt. Canned or smoked fish. Whole eggs or egg yolks. Chicken or [place] with skin. Dairy Whole or 2% milk, cream, and half-and-half. Whole or full-fat cream cheese. Whole-fat or sweetened yogurt. Full-fat cheese. Nondairy creamers. Whipped toppings. Processed cheese and cheese spreads. Fats and oils Butter. Stick margarine. Lard. Shortening. Ghee. Bacon fat. Tropical oils, such as coconut, palm kernel, or palm oil. Seasoning and other foods Salted popcorn and pretzels. Onion salt, garlic salt, seasoned salt, table salt, and sea salt. Worcestershire sauce. Tartar sauce. Barbecue sauce. Teriyaki sauce. Soy sauce, including reduced-sodium. Steak sauce.  Canned and packaged gravies. Fish sauce. Oyster sauce. Cocktail sauce. Horseradish that you find on the shelf. Ketchup. Mustard. Meat flavorings and tenderizers. Bouillon cubes. Hot sauce and Tabasco sauce. Premade or packaged marinades. Premade or packaged taco seasonings. Relishes. Regular salad dressings. Where to find more information:  National Heart, Lung, and Blood Institute: PopSteam.is  American Heart Association: www.heart.org Summary  The DASH eating plan is a healthy eating plan that has been shown to reduce high blood pressure (hypertension). It may also reduce your risk for type 2 diabetes, heart disease, and stroke.  With the DASH eating plan, you should limit salt (sodium) intake to 2,300 mg a day. If you have hypertension, you may need to reduce your sodium intake to 1,500 mg a day.  When on the DASH eating plan, aim to eat more fresh fruits and vegetables, whole grains, lean proteins, low-fat dairy, and heart-healthy fats.  Work with your health care provider or diet and nutrition specialist (dietitian) to adjust your eating plan to your individual calorie needs. This information is not intended to replace advice given to you by your health care provider. Make sure you discuss any questions you have with your health care provider. Document Released: 11/27/2011 Document Revised: 12/01/2016 Document Reviewed: 12/01/2016 Elsevier Interactive Patient Education  [employer].

## 2018-02-03 ENCOUNTER — Ambulatory Visit (INDEPENDENT_AMBULATORY_CARE_PROVIDER_SITE_OTHER): Payer: Medicare Other | Admitting: *Deleted

## 2018-02-03 DIAGNOSIS — I442 Atrioventricular block, complete: Secondary | ICD-10-CM | POA: Diagnosis not present

## 2018-02-03 NOTE — Progress Notes (Signed)
Remote pacemaker transmission.   

## 2018-02-04 ENCOUNTER — Telehealth: Payer: Self-pay | Admitting: Pharmacist

## 2018-02-04 ENCOUNTER — Encounter: Payer: Self-pay | Admitting: Cardiology

## 2018-02-04 NOTE — Telephone Encounter (Signed)
Patient reports leg swollen after recent trip and not back to normal yet.  Patient denies pain, shortness or breath, or increased BP  Reccomendations:  1. Decrease AZOR to 1 tablets daily for 2 days, then resume 2 tablets daily  2. Keep legs elevated as much as possible   3. Keep sodium intake to a minimum   4. Contact clinic is swelling not improved by next week or shortness of breath develops.   Saloma Cadena Rodriguez-Guzman PharmD, BCPS, CPP [facility] [address] [place] [phone] 02/04/2018 5:07 PM

## 2018-02-17 LAB — CUP PACEART REMOTE DEVICE CHECK
Battery Impedance: 161 Ohm
Battery Voltage: 2.79 V
Brady Statistic AP VP Percent: 41 %
Brady Statistic AP VS Percent: 0 %
Date Time Interrogation Session: 20190213135345
Implantable Lead Implant Date: 20151111
Implantable Lead Location: 753859
Implantable Lead Model: 5076
Lead Channel Impedance Value: 454 Ohm
Lead Channel Pacing Threshold Pulse Width: 0.4 ms
Lead Channel Pacing Threshold Pulse Width: 0.4 ms
Lead Channel Setting Pacing Amplitude: 2.5 V
Lead Channel Setting Pacing Pulse Width: 0.4 ms
MDC IDC LEAD IMPLANT DT: 20151111
MDC IDC LEAD LOCATION: 753860
MDC IDC MSMT BATTERY REMAINING LONGEVITY: 122 mo
MDC IDC MSMT LEADCHNL RA PACING THRESHOLD AMPLITUDE: 0.875 V
MDC IDC MSMT LEADCHNL RV IMPEDANCE VALUE: 626 Ohm
MDC IDC MSMT LEADCHNL RV PACING THRESHOLD AMPLITUDE: 1.125 V
MDC IDC PG IMPLANT DT: 20151111
MDC IDC SET LEADCHNL RA PACING AMPLITUDE: 1.75 V
MDC IDC SET LEADCHNL RV SENSING SENSITIVITY: 4 mV
MDC IDC STAT BRADY AS VP PERCENT: 59 %
MDC IDC STAT BRADY AS VS PERCENT: 0 %

## 2018-03-09 ENCOUNTER — Ambulatory Visit: Payer: Medicare Other | Admitting: Cardiovascular Disease

## 2018-03-09 ENCOUNTER — Encounter: Payer: Self-pay | Admitting: Cardiovascular Disease

## 2018-03-09 VITALS — BP 138/66 | HR 62 | Ht 66.0 in | Wt 193.0 lb

## 2018-03-09 DIAGNOSIS — I10 Essential (primary) hypertension: Secondary | ICD-10-CM

## 2018-03-09 DIAGNOSIS — E7800 Pure hypercholesterolemia, unspecified: Secondary | ICD-10-CM | POA: Diagnosis not present

## 2018-03-09 DIAGNOSIS — I442 Atrioventricular block, complete: Secondary | ICD-10-CM | POA: Diagnosis not present

## 2018-03-09 DIAGNOSIS — I480 Paroxysmal atrial fibrillation: Secondary | ICD-10-CM | POA: Diagnosis not present

## 2018-03-09 NOTE — Patient Instructions (Signed)
Medication Instructions:  Your physician recommends that you continue on your current medications as directed. Please refer to the Current Medication list given to you today.  Labwork: NONE  Testing/Procedures: NONE  Follow-Up: Your physician recommends that you schedule a follow-up appointment in: 6 MONTH OV   If you need a refill on your cardiac medications before your next appointment, please call your pharmacy.  

## 2018-03-09 NOTE — Progress Notes (Signed)
Cardiology Office Note   Date:  03/09/2018   ID:  Lauren Henry, DOB 02/13/1941, MRN [id number]  PCP:  Lauren Garbe, MD  Cardiologist:   Lauren Si, MD   Chief Complaint  Patient presents with  . Follow-up    3 months;     History of Present Illness: Lauren Henry is a 78 y.o. female with complete heart block s/p PPM, paroxysmal atrial fibrillation, and hypertension who is being seen today for the evaluation of hypertension at the request of Lauren Henry, Lauren Koh, MD.  Lauren Henry had a pacemaker implanted by Dr. Ladona Henry in 2015 due to complete heart block.  She was minimally symptomatic and reports that it was incidentally found when she presented for knee surgery.  Since that time she has been doing well.  She continues to have difficult to manage hypertension.  She last followed up with Dr. Ladona Henry on 10/2017 and her blood pressure was elevated.  Carvedilol was switched to twice daily dosing from once daily long-acting dosing. However she reports that she has been taking all her carvedilol in the AM.  Her BP has been mostly within range in the moring and increases throughout the day.  It is typically around 120 systolic in the AM.  She has been working with Dr. Wylene Henry and takes clonidine as needed for SBP >160 mmHg.  She has been taking this most days in the evening because her average BP has ranged from the 150s-180s.  She saw him 10/2017 and there was concern that anxiety may be contributing, as her AM BP is well controlled and it seems to increase throughout the day.  She was prescribed clonazepam but hasn't seen any improvement in her BP since starting this medication.  She notes that when her BP is very elevate she is unable to sleep.  She denies chest pain or shortness of breath.  Lauren Henry is very physically active.  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  She denies lower extremity edema, orthopnea or PND.    At her last appointment  clonidine was increased to tid. She followed up with our pharmacist 12/2017 and carvedilol was switched to lunch time.  It was also felt that anxiety was contributing to her hypertension.  Since her last appointment she has been doing well.  She is been checking her blood pressures and they have been mostly in the 110s-130s.  She has occasional episodes in the 100s or 140s.  She was complaining of some swelling in her legs.  She saw her primary care provider who found that she had cellulitis.  She was treated for this and instructed to start wearing compression stockings.  The edema has been much better since that time.  She has no orthopnea or PND.  She denies chest pain or shortness of breath.  Clonazepam is helped her anxiety.    Past Medical History:  Diagnosis Date  . Arthritis   . Atrial fibrillation (HCC)   . Diarrhea    since gallbladder removed  . 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 History of blood transfusion as a child   no abnormal reaction noted  . History of colon polyps   . Hyperlipidemia    takes Pravastatin daily  . Hypertension    takes Azor and Metoprolol daily  . Hypothyroidism    takes Synthroid daily  .  Joint pain   . Presence of permanent cardiac pacemaker   . Urinary frequency   . Urinary urgency     Past Surgical History:  Procedure Laterality Date  . ABDOMINAL HYSTERECTOMY    . cataract surgery Bilateral   . CHOLECYSTECTOMY  1971  . COLONOSCOPY    . EYE SURGERY    . INSERT / REPLACE / REMOVE PACEMAKER     Medtronic PPM 11/01/14 (Dr. Lewayne Bunting)  . JOINT REPLACEMENT Right    knee  . PERMANENT PACEMAKER INSERTION N/A 11/01/2014   Procedure: PERMANENT PACEMAKER INSERTION;  Surgeon: Marinus Maw, MD;  Location: [facility] CATH LAB;  Service: Cardiovascular;  Laterality: N/A;  . stomach stapled  1985  . TOTAL KNEE ARTHROPLASTY Left 11/05/2015   Procedure: TOTAL KNEE ARTHROPLASTY;  Surgeon: Dannielle Huh, MD;   Location: [facility] OR;  Service: Orthopedics;  Laterality: Left;     Current Outpatient Medications  Medication Sig Dispense Refill  . acetaminophen (TYLENOL) 500 MG tablet Take 1,000 mg by mouth at bedtime as needed (pain). For leg pain    . amLODipine-olmesartan (AZOR) 5-20 MG tablet Take 2 tablets daily by mouth. 180 tablet 3  . carvedilol (COREG CR) 10 MG 24 hr capsule Take 2 capsules (20 mg total) 2 (two) times daily by mouth. 360 capsule 3  . Cholecalciferol (VITAMIN D3) 5000 UNITS CAPS Take 5,000 Units by mouth daily.    . cloNIDine (CATAPRES) 0.1 MG tablet Take 1 tablet at lunch time and 1 tablet at bedtime 60 tablet 11  . ELIQUIS 5 MG TABS tablet take 1 tablet by mouth twice a day 180 tablet 4  . levothyroxine (SYNTHROID, LEVOTHROID) 150 MCG tablet Take 150 mcg by mouth. Take 1 tablet by mouth and take an extra 1/2 tablet on two days  0  . Multiple Vitamin (MULTIVITAMIN WITH MINERALS) TABS tablet Take 1 tablet by mouth daily.    . pravastatin (PRAVACHOL) 40 MG tablet Take 40 mg by mouth daily.    Marland Kitchen spironolactone (ALDACTONE) 25 MG tablet Take 1 tablet by mouth daily.  0   No current facility-administered medications for this visit.     Allergies:   Patient has no known allergies.    Social History:  The patient  reports that  has never smoked. she has never used smokeless tobacco. She reports that she drinks about 0.6 oz of alcohol per week. She reports that she does not use drugs.   Family History:  The patient's family history includes Heart attack in her father; Heart disease in her father; Stroke in her sister.    ROS:  Please see the history of present illness.   Otherwise, review of systems are positive for burning in feet.   All other systems are reviewed and negative.    PHYSICAL EXAM: VS:  BP 138/66   Pulse 62   Ht 5\' 6"  (1.676 m)   Wt 193 lb (87.5 kg)   BMI 31.15 kg/m  , BMI Body mass index is 31.15 kg/m. GENERAL:  Well appearing HEENT:  Pupils equal round and  reactive, fundi not visualized, oral mucosa unremarkable NECK:  No jugular venous distention, waveform within normal limits, carotid upstroke brisk and symmetric, no bruits, no thyromegaly LUNGS:  Clear to auscultation bilaterally HEART:  RRR.  PMI not displaced or sustained,S1 and S2 within normal limits, no S3, no S4, no clicks, no rubs, no murmurs ABD:  Flat, positive bowel sounds normal in frequency in pitch, no bruits, no rebound, no  guarding, no midline pulsatile mass, no hepatomegaly, no splenomegaly EXT:  2 plus pulses throughout, no edema, no cyanosis no clubbing SKIN:  No rashes no nodules NEURO:  Cranial nerves II through XII grossly intact, motor grossly intact throughout PSYCH:  Cognitively intact, oriented to person place and time   EKG:  EKG is not ordered today.  Echo 10/30/14: Study Conclusions  - Left ventricle: The cavity size was normal. There was mild focal basal hypertrophy of the septum. Systolic function was normal. The estimated ejection fraction was in the range of 60% to 65%. Wall motion was normal; there were no regional wall motion abnormalities. - Aortic valve: Trileaflet; mildly thickened, mildly calcified leaflets. Transvalvular velocity was minimally increased. There was no stenosis. - Mitral valve: Calcified annulus. There was mild regurgitation. - Right ventricle: The cavity size was mildly dilated. Wall thickness was normal.   Recent Labs: No results found for requested labs within last 8760 hours.    Lipid Panel    Component Value Date/Time   CHOL 126 10/31/2014 0214   TRIG 83 10/31/2014 0214   HDL 49 10/31/2014 0214   CHOLHDL 2.6 10/31/2014 0214   VLDL 17 10/31/2014 0214   LDLCALC 60 10/31/2014 0214     01/21/18: Sodium 140, BUN 27, creatinine 1.2 AST TSH 5.03 07/15/17: Total cholesterol 169, triglycerides 89, HDL 52, LDL 99  Wt Readings from Last 3 Encounters:  03/09/18 193 lb (87.5 kg)  12/18/17 189 lb (85.7 kg)    11/04/17 187 lb 6.4 oz (85 kg)      ASSESSMENT AND PLAN:  # PAF: Currently in sinus rhythm.  Continue Eliquis and carvedilol.   # Hypertension: Blood pressure is much better controlled.  It seems that anxiety contributes given that clonazepam has helped to stabilize her BP.  Continue amlodipine, carvedilol, olmesartan, clonidine, and spironolactone.  # CHB s/p PPM: Managed by Dr. Ladona Ridgelaylor.    # Hyperlipidemia: Managed by Dr. Wylene Simmerisovec.  Continue pravastatin.  Current medicines are reviewed at length with the patient today.  The patient does not have concerns regarding medicines.  The following changes have been made:  no change  Labs/ tests ordered today include:  No orders of the defined types were placed in this encounter.    Disposition:   FU with Lauren Pudwill C. Duke Salviaandolph, MD, [facility] in 6 months.  Time spent: 30 minutes-Greater than 50% of this time was spent in counseling, explanation of diagnosis, planning of further management, and coordination of care.   This note was written with the assistance of speech recognition software.  Please excuse any transcriptional errors.  Signed, Nan Maya C. Duke Salviaandolph, MD, [facility]  03/09/2018 2:02 PM    [facility]

## 2018-03-14 ENCOUNTER — Encounter: Payer: Self-pay | Admitting: Cardiovascular Disease

## 2018-05-05 ENCOUNTER — Ambulatory Visit (INDEPENDENT_AMBULATORY_CARE_PROVIDER_SITE_OTHER): Payer: Medicare Other | Admitting: *Deleted

## 2018-05-05 DIAGNOSIS — I442 Atrioventricular block, complete: Secondary | ICD-10-CM | POA: Diagnosis not present

## 2018-05-05 DIAGNOSIS — I480 Paroxysmal atrial fibrillation: Secondary | ICD-10-CM

## 2018-05-05 NOTE — Progress Notes (Signed)
Remote pacemaker transmission.   

## 2018-05-06 LAB — CUP PACEART REMOTE DEVICE CHECK
Battery Remaining Longevity: 117 mo
Brady Statistic AS VP Percent: 60 %
Date Time Interrogation Session: 20190515120709
Implantable Lead Implant Date: 20151111
Implantable Lead Location: 753860
Implantable Lead Model: 5076
Implantable Lead Model: 5076
Implantable Pulse Generator Implant Date: 20151111
Lead Channel Pacing Threshold Amplitude: 0.75 V
Lead Channel Pacing Threshold Amplitude: 1.125 V
Lead Channel Pacing Threshold Pulse Width: 0.4 ms
Lead Channel Sensing Intrinsic Amplitude: 1.4 mV
Lead Channel Setting Pacing Amplitude: 1.5 V
Lead Channel Setting Pacing Pulse Width: 0.4 ms
Lead Channel Setting Sensing Sensitivity: 4 mV
MDC IDC LEAD IMPLANT DT: 20151111
MDC IDC LEAD LOCATION: 753859
MDC IDC MSMT BATTERY IMPEDANCE: 185 Ohm
MDC IDC MSMT BATTERY VOLTAGE: 2.79 V
MDC IDC MSMT LEADCHNL RA IMPEDANCE VALUE: 429 Ohm
MDC IDC MSMT LEADCHNL RA PACING THRESHOLD PULSEWIDTH: 0.4 ms
MDC IDC MSMT LEADCHNL RV IMPEDANCE VALUE: 544 Ohm
MDC IDC SET LEADCHNL RV PACING AMPLITUDE: 2.5 V
MDC IDC STAT BRADY AP VP PERCENT: 40 %
MDC IDC STAT BRADY AP VS PERCENT: 0 %
MDC IDC STAT BRADY AS VS PERCENT: 0 %

## 2018-05-07 ENCOUNTER — Telehealth: Payer: Self-pay | Admitting: Pharmacist

## 2018-05-07 ENCOUNTER — Encounter: Payer: Self-pay | Admitting: Cardiology

## 2018-05-07 NOTE — Telephone Encounter (Signed)
Received a phone call from Mrs Neuser. She is to undergo dental procedure with Dr Alanson Puls DDS and will like instructions for her anticoagulation.  Patient is unable to clearly stated type or procedure. Date is unknown as well.  LMOM for Dr Mayford Knife.  "Please send request for incoming procedure with procedure description and date" to [phone] or call at [phone] if additional information needed.   Terrall Bley Rodriguez-Guzman PharmD, BCPS, CPP [facility] [address] [place] [address] 05/07/2018 1:18 PM

## 2018-05-10 NOTE — Telephone Encounter (Signed)
Spoke with Sales executive at [facility].  Patient is to have 3 crowns placed and was told by DDS that she did not need to be pre-medicated.    Agreed with assistant - knee replacement is no longer an indication for dental pre-medication.  Office is aware of this.  Also, patient is on Eliquis, anticoagulation.  Because crowns are above the gum, there is no bleed risk and patient should continue Eliquis uninterrupted.    LMOM for patient with above instructions.

## 2018-08-04 ENCOUNTER — Ambulatory Visit (INDEPENDENT_AMBULATORY_CARE_PROVIDER_SITE_OTHER): Payer: Medicare Other | Admitting: *Deleted

## 2018-08-04 ENCOUNTER — Telehealth: Payer: Self-pay | Admitting: Cardiology

## 2018-08-04 DIAGNOSIS — I442 Atrioventricular block, complete: Secondary | ICD-10-CM

## 2018-08-04 NOTE — Telephone Encounter (Signed)
LMOVM reminding pt to send remote transmission.   

## 2018-08-05 ENCOUNTER — Encounter: Payer: Self-pay | Admitting: Cardiology

## 2018-08-05 NOTE — Progress Notes (Signed)
Remote pacemaker transmission.   

## 2018-08-16 ENCOUNTER — Telehealth: Payer: Self-pay | Admitting: Internal Medicine

## 2018-08-16 DIAGNOSIS — I480 Paroxysmal atrial fibrillation: Secondary | ICD-10-CM

## 2018-08-16 NOTE — Telephone Encounter (Signed)
New Message    *STAT* If patient is at the pharmacy, call can be transferred to refill team.  Pt states she tried to get it filled with the pharmacy but it was denied and she wants to know why  1. Which medications need to be refilled? (please list name of each medication and dose if known) ELIQUIS 5 MG TABS tablet  2. Which pharmacy/location (including street and city if local pharmacy) is medication to be sent to? [facility] [phone] - [place], [place] - [address]  3. Do they need a 30 day or 90 day supply?

## 2018-08-17 MED ORDER — APIXABAN 5 MG PO TABS: 5.0000 mg | ORAL_TABLET | Freq: Two times a day (BID) | ORAL | 1 refills | Status: DC

## 2018-08-17 NOTE — Telephone Encounter (Signed)
Refill sent and confirmed received by pharmacy.  LMOM to notify patient

## 2018-08-17 NOTE — Telephone Encounter (Signed)
Patient calling to f/u on request to refill eliquis as she is out of medication. Pharmacy told her that the refill request was denied by our office. She also states that she called the office yesterday morning and her pharmacy still hasn't received an order. She would like a call back at [phone] when this has been addressed. Thanks, MI

## 2018-09-04 LAB — CUP PACEART REMOTE DEVICE CHECK
Battery Remaining Longevity: 116 mo
Battery Voltage: 2.79 V
Brady Statistic AP VP Percent: 39 %
Brady Statistic AP VS Percent: 0 %
Brady Statistic AS VP Percent: 61 %
Brady Statistic AS VS Percent: 0 %
Implantable Lead Location: 753860
Implantable Lead Model: 5076
Implantable Lead Model: 5076
Lead Channel Impedance Value: 396 Ohm
Lead Channel Pacing Threshold Amplitude: 0.625 V
Lead Channel Pacing Threshold Amplitude: 1.125 V
Lead Channel Pacing Threshold Pulse Width: 0.4 ms
Lead Channel Pacing Threshold Pulse Width: 0.4 ms
Lead Channel Setting Pacing Amplitude: 1.5 V
Lead Channel Setting Sensing Sensitivity: 4 mV
MDC IDC LEAD IMPLANT DT: 20151111
MDC IDC LEAD IMPLANT DT: 20151111
MDC IDC LEAD LOCATION: 753859
MDC IDC MSMT BATTERY IMPEDANCE: 185 Ohm
MDC IDC MSMT LEADCHNL RV IMPEDANCE VALUE: 555 Ohm
MDC IDC PG IMPLANT DT: 20151111
MDC IDC SESS DTM: 20190814182839
MDC IDC SET LEADCHNL RV PACING AMPLITUDE: 2.5 V
MDC IDC SET LEADCHNL RV PACING PULSEWIDTH: 0.4 ms

## 2018-09-09 ENCOUNTER — Ambulatory Visit: Payer: Medicare Other | Admitting: Cardiovascular Disease

## 2018-09-09 ENCOUNTER — Encounter: Payer: Self-pay | Admitting: Cardiovascular Disease

## 2018-09-09 VITALS — BP 142/70 | HR 69 | Ht 65.0 in | Wt 195.8 lb

## 2018-09-09 DIAGNOSIS — E7800 Pure hypercholesterolemia, unspecified: Secondary | ICD-10-CM

## 2018-09-09 DIAGNOSIS — R600 Localized edema: Secondary | ICD-10-CM

## 2018-09-09 DIAGNOSIS — I480 Paroxysmal atrial fibrillation: Secondary | ICD-10-CM | POA: Diagnosis not present

## 2018-09-09 DIAGNOSIS — I442 Atrioventricular block, complete: Secondary | ICD-10-CM

## 2018-09-09 DIAGNOSIS — I10 Essential (primary) hypertension: Secondary | ICD-10-CM | POA: Diagnosis not present

## 2018-09-09 MED ORDER — CLONIDINE HCL 0.2 MG PO TABS: ORAL_TABLET | ORAL | 3 refills | Status: DC

## 2018-09-09 MED ORDER — OLMESARTAN MEDOXOMIL 40 MG PO TABS: 40.0000 mg | ORAL_TABLET | Freq: Every day | ORAL | 3 refills | Status: DC

## 2018-09-09 NOTE — Progress Notes (Signed)
Cardiology Office Note   Date:  09/09/2018   ID:  Lauren Henry, DOB 11-Mar-1941, MRN [id number]  PCP:  Gaspar Garbe, MD  Cardiologist:   Chilton Si, MD   Chief Complaint  Patient presents with  . Follow-up     History of Present Illness: Lauren Henry is a 77 y.o. female with complete heart block s/p PPM, paroxysmal atrial fibrillation, and hypertension who is being seen today for the evaluation of hypertension at the request of Tisovec, Adelfa Koh, MD.  Lauren Henry had a pacemaker implanted by Dr. Ladona Ridgel in 2015 due to complete heart block.  She was minimally symptomatic and reports that it was incidentally found when she presented for knee surgery.  Since that time she has been doing well.  She continues to have difficult to manage hypertension.  She last followed up with Dr. Ladona Ridgel on 10/2017 and her blood pressure was elevated.  Carvedilol was switched to twice daily dosing from once daily long-acting dosing. However she reports that she has been taking all her carvedilol in the AM.  Her BP has been mostly within range in the moring and increases throughout the day.  It is typically around 120 systolic in the AM.  She has been working with Dr. Wylene Simmer and takes clonidine as needed for SBP >160 mmHg.  She has been taking this most days in the evening because her average BP has ranged from the 150s-180s.  She saw him 10/2017 and there was concern that anxiety may be contributing, as her AM BP is well controlled and it seems to increase throughout the day.  She was prescribed clonazepam but hasn't seen any improvement in her BP since starting this medication.  She notes that when her BP is very elevate she is unable to sleep.  She denies chest pain or shortness of breath.  Lauren Henry is very physically active.  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  She denies lower extremity edema, orthopnea or PND.    Since her last appointment Lauren Henry'  blood pressure has been mostly in the 120s to 130s.  She got a rash last spring that became infected.  She saw the dermatologist who apparently felt she had cellulitis and gave her an antibiotic.  He said that she had to have a swelling of her leg before he can do anything else for her.  It eventually improved.  6 weeks ago she again saw her PCP and her legs were swollen.  He reportedly told her that it was due to 1 of her medications.  However, given that she was doing so well he recommended that she not make any changes.  He did recommend that she wear compression socks and use Vaseline.  This helped somewhat but the swelling seemed to have gotten worse.  At some point it was so bad that her legs were weeping and were very pruritic.  Since that time it is again improved.  She has swelling that she says improves with elevation but gets worse it later in the day.  She has not been wearing compression socks because it has been so hot out.  She has no chest pain or shortness of breath.  She continues to walk for exercise 30 minutes every day and has no exertional symptoms.  She has no orthopnea or PND.   Past Medical History:  Diagnosis Date  . Arthritis   . Atrial fibrillation (  HCC)   . Diarrhea    since gallbladder removed  . 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 History of blood transfusion as a child   no abnormal reaction noted  . History of colon polyps   . Hyperlipidemia    takes Pravastatin daily  . Hypertension    takes Azor and Metoprolol daily  . Hypothyroidism    takes Synthroid daily  . Joint pain   . Presence of permanent cardiac pacemaker   . Urinary frequency   . Urinary urgency     Past Surgical History:  Procedure Laterality Date  . ABDOMINAL HYSTERECTOMY    . cataract surgery Bilateral   . CHOLECYSTECTOMY  1971  . COLONOSCOPY    . EYE SURGERY    . INSERT / REPLACE / REMOVE PACEMAKER     Medtronic PPM 11/01/14 (Dr.  Lewayne Bunting)  . JOINT REPLACEMENT Right    knee  . PERMANENT PACEMAKER INSERTION N/A 11/01/2014   Procedure: PERMANENT PACEMAKER INSERTION;  Surgeon: Marinus Maw, MD;  Location: [facility] CATH LAB;  Service: Cardiovascular;  Laterality: N/A;  . stomach stapled  1985  . TOTAL KNEE ARTHROPLASTY Left 11/05/2015   Procedure: TOTAL KNEE ARTHROPLASTY;  Surgeon: Dannielle Huh, MD;  Location: [facility] OR;  Service: Orthopedics;  Laterality: Left;     Current Outpatient Medications  Medication Sig Dispense Refill  . acetaminophen (TYLENOL) 500 MG tablet Take 1,000 mg by mouth at bedtime as needed (pain). For leg pain    . apixaban (ELIQUIS) 5 MG TABS tablet Take 1 tablet (5 mg total) by mouth 2 (two) times daily. 180 tablet 1  . carvedilol (COREG CR) 40 MG 24 hr capsule Take 40 mg by mouth daily.    . Cholecalciferol (VITAMIN D3) 5000 UNITS CAPS Take 5,000 Units by mouth daily.    . cloNIDine (CATAPRES) 0.2 MG tablet Take 1 tablet at lunch time and 1 tablet at bedtime 60 tablet 3  . levothyroxine (SYNTHROID, LEVOTHROID) 150 MCG tablet Take 150 mcg by mouth. Take 1 tablet by mouth and take an extra 1/2 tablet on two days  0  . Multiple Vitamin (MULTIVITAMIN WITH MINERALS) TABS tablet Take 1 tablet by mouth daily.    . pravastatin (PRAVACHOL) 40 MG tablet Take 40 mg by mouth daily.    Marland Kitchen spironolactone (ALDACTONE) 25 MG tablet Take 1 tablet by mouth daily.  0  . olmesartan (BENICAR) 40 MG tablet Take 1 tablet (40 mg total) by mouth daily. 90 tablet 3   No current facility-administered medications for this visit.     Allergies:   Patient has no known allergies.    Social History:  The patient  reports that she has never smoked. She has never used smokeless tobacco. She reports that she drinks about 1.0 standard drinks of alcohol per week. She reports that she does not use drugs.   Family History:  The patient's family history includes Heart attack in her father; Heart disease in her father; Stroke in her  sister.    ROS:  Please see the history of present illness.   Otherwise, review of systems are positive for burning in feet.   All other systems are reviewed and negative.    PHYSICAL EXAM: VS:  BP (!) 142/70   Pulse 69   Ht 5\' 5"  (1.651 m)   Wt 195 lb 12.8 oz (88.8 kg)   BMI 32.58 kg/m  , BMI Body  mass index is 32.58 kg/m. GENERAL:  Well appearing HEENT: Pupils equal round and reactive, fundi not visualized, oral mucosa unremarkable NECK:  No jugular venous distention, waveform within normal limits, carotid upstroke brisk and symmetric, no bruits LUNGS:  Clear to auscultation bilaterally HEART:  RRR.  PMI not displaced or sustained,S1 and S2 within normal limits, no S3, no S4, no clicks, no rubs, no murmurs ABD:  Flat, positive bowel sounds normal in frequency in pitch, no bruits, no rebound, no guarding, no midline pulsatile mass, no hepatomegaly, no splenomegaly EXT:  2 plus pulses throughout, tense, 1+ non-pitting edema to mid tibia bilaterally, no cyanosis no clubbing SKIN:  No rashes no nodules.  Hypopigmentation of both calves and tibia.  Excoriations and erythematous macules on lower legs. NEURO:  Cranial nerves II through XII grossly intact, motor grossly intact throughout PSYCH:  Cognitively intact, oriented to person place and time  EKG:  EKG is ordered today. 09/09/18:  ASVP.  Rate 69 bpm.  Echo 10/30/14: Study Conclusions  - Left ventricle: The cavity size was normal. There was mild focal basal hypertrophy of the septum. Systolic function was normal. The estimated ejection fraction was in the range of 60% to 65%. Wall motion was normal; there were no regional wall motion abnormalities. - Aortic valve: Trileaflet; mildly thickened, mildly calcified leaflets. Transvalvular velocity was minimally increased. There was no stenosis. - Mitral valve: Calcified annulus. There was mild regurgitation. - Right ventricle: The cavity size was mildly dilated.  Wall thickness was normal.   Recent Labs: No results found for requested labs within last 8760 hours.    Lipid Panel    Component Value Date/Time   CHOL 126 10/31/2014 0214   TRIG 83 10/31/2014 0214   HDL 49 10/31/2014 0214   CHOLHDL 2.6 10/31/2014 0214   VLDL 17 10/31/2014 0214   LDLCALC 60 10/31/2014 0214     01/21/18: Sodium 140, BUN 27, creatinine 1.2 AST TSH 5.03 07/15/17: Total cholesterol 169, triglycerides 89, HDL 52, LDL 99  Wt Readings from Last 3 Encounters:  09/09/18 195 lb 12.8 oz (88.8 kg)  03/09/18 193 lb (87.5 kg)  12/18/17 189 lb (85.7 kg)      ASSESSMENT AND PLAN:  # PAF: Currently in sinus rhythm.  Continue Eliquis and carvedilol.   # Hypertension: Blood pressure is much better controlled.  However her blood pressure medication may be causing her rash.  We will try stopping amlodipine as this can certainly cause swelling.  Continue telmisartan 40 mg daily.  Continue carvedilol and increase clonidine to 0.2 mg twice daily.  # CHB s/p PPM: Currently ASVP.  Managed by Dr. Ladona Ridgelaylor.    # Hyperlipidemia: Managed by Dr. Wylene Simmerisovec.  Continue pravastatin.  # Rash: # LE Edema: Stop amlodipine as above.  We will also get an echo.   Current medicines are reviewed at length with the patient today.  The patient does not have concerns regarding medicines.  The following changes have been made:  no change  Labs/ tests ordered today include:   Orders Placed This Encounter  Procedures  . EKG 12-Lead  . ECHOCARDIOGRAM COMPLETE     Disposition:   FU with Oree Hislop C. Duke Salviaandolph, MD, [facility] in 1 month.      Signed, Bethaney Oshana C. Duke Salviaandolph, MD, [facility]  09/09/2018 2:14 PM    [facility]

## 2018-09-09 NOTE — Patient Instructions (Addendum)
Medication Instructions:  STOP AMLODIPINE-OLMESARTAN   START OLMESARTAN 40 MG DAILY   INCREASE YOUR CLONIDINE TO 0.2 MG TWICE A DAY   Labwork: NONE  Testing/Procedures: Your physician has requested that you have an echocardiogram. Echocardiography is a painless test that uses sound waves to create images of your heart. It provides your doctor with information about the size and shape of your heart and how well your heart's chambers and valves are working. This procedure takes approximately one hour. There are no restrictions for this procedure. [facility] AT DR Lubertha BasqueAYLOR'S OFFICE   Follow-Up: Your physician recommends that you schedule a follow-up appointment in: 1 MONTH   If you need a refill on your cardiac medications before your next appointment, please call your pharmacy.

## 2018-09-13 ENCOUNTER — Other Ambulatory Visit: Payer: Self-pay

## 2018-09-13 ENCOUNTER — Ambulatory Visit (HOSPITAL_COMMUNITY): Payer: Medicare Other | Attending: Cardiovascular Disease

## 2018-09-13 DIAGNOSIS — I10 Essential (primary) hypertension: Secondary | ICD-10-CM | POA: Diagnosis not present

## 2018-09-13 DIAGNOSIS — E785 Hyperlipidemia, unspecified: Secondary | ICD-10-CM | POA: Diagnosis not present

## 2018-09-13 DIAGNOSIS — R600 Localized edema: Secondary | ICD-10-CM | POA: Diagnosis present

## 2018-09-13 DIAGNOSIS — I4891 Unspecified atrial fibrillation: Secondary | ICD-10-CM | POA: Diagnosis not present

## 2018-09-13 DIAGNOSIS — I442 Atrioventricular block, complete: Secondary | ICD-10-CM | POA: Insufficient documentation

## 2018-09-17 ENCOUNTER — Telehealth: Payer: Self-pay | Admitting: *Deleted

## 2018-09-17 DIAGNOSIS — M7989 Other specified soft tissue disorders: Secondary | ICD-10-CM

## 2018-09-17 DIAGNOSIS — R9430 Abnormal result of cardiovascular function study, unspecified: Secondary | ICD-10-CM

## 2018-09-17 NOTE — Telephone Encounter (Signed)
-----   Message from Chilton Si, MD sent at 09/14/2018  4:41 PM EDT ----- Echo shows that her heart is squeezing a little bit weaker than in the past.  This may be because of chronic pacing of the ventricle.  However, recommend getting a Lexiscan Myoview to make sure that blockages in the heart arteries are not the cause.

## 2018-09-17 NOTE — Telephone Encounter (Signed)
Left message to call back  

## 2018-09-23 NOTE — Telephone Encounter (Signed)
Advised patient, orders placed, and sent to scheduling to arrange  

## 2018-09-24 ENCOUNTER — Telehealth (HOSPITAL_COMMUNITY): Payer: Self-pay

## 2018-09-24 NOTE — Telephone Encounter (Signed)
Encounter complete. 

## 2018-09-27 ENCOUNTER — Telehealth: Payer: Self-pay | Admitting: Cardiovascular Disease

## 2018-09-27 NOTE — Telephone Encounter (Signed)
New message:       Pt is calling to see how much the myocardial test will be due to her insurance not covering her echo.

## 2018-09-28 ENCOUNTER — Inpatient Hospital Stay (HOSPITAL_COMMUNITY): Admission: RE | Admit: 2018-09-28 | Payer: Medicare Other | Source: Ambulatory Visit

## 2018-09-29 ENCOUNTER — Telehealth: Payer: Self-pay | Admitting: *Deleted

## 2018-09-29 NOTE — Telephone Encounter (Signed)
Was following up on patient getting her myoview study. After reviewing chart saw where patient had cancelled and message taken that her Echo had not been covered by insurance. Did speak with Pam in the billing department and no bill has been sent to patient at this time. Her EOB may say that it was not covered however there may have been something not correct in billing process for Echo and should be refiled with insurance. Left message for patient to call back and discuss further

## 2018-10-01 NOTE — Telephone Encounter (Signed)
Discussed with patient and advised would have someone from billing reach out to her. Also she will go ahead and try to get myoview prior to follow up visit

## 2018-10-01 NOTE — Telephone Encounter (Signed)
Follow Up:     Returning your call from 09-29-18.

## 2018-10-04 NOTE — Telephone Encounter (Signed)
Left message for patient to call back Patient is scheduled for follow up prior to recommended nuclear, was going to offer patient getting nuclear sooner or rescheduling f/u until afterwards

## 2018-10-04 NOTE — Telephone Encounter (Signed)
Follow Up:; ° ° °Returning your call. °

## 2018-10-04 NOTE — Telephone Encounter (Signed)
Also sent message to scheduling to see if could arrange myoview prior to ov next   Discussed with patient and advised would have someone from billing reach out to her. Also she will go ahead and try to get myoview prior to follow up visit

## 2018-10-04 NOTE — Telephone Encounter (Signed)
Spoke with patient and message sent to scheduling to try to move up [facility]

## 2018-10-05 ENCOUNTER — Telehealth (HOSPITAL_COMMUNITY): Payer: Self-pay

## 2018-10-05 NOTE — Telephone Encounter (Signed)
Encounter complete. 

## 2018-10-06 ENCOUNTER — Telehealth (HOSPITAL_COMMUNITY): Payer: Self-pay

## 2018-10-06 NOTE — Telephone Encounter (Signed)
Encounter complete. 

## 2018-10-07 ENCOUNTER — Ambulatory Visit (HOSPITAL_COMMUNITY)
Admission: RE | Admit: 2018-10-07 | Discharge: 2018-10-07 | Disposition: A | Discharge status: Home / Self Care | Payer: Medicare Other | Source: Ambulatory Visit | Attending: Cardiology | Admitting: Cardiology

## 2018-10-07 DIAGNOSIS — R9430 Abnormal result of cardiovascular function study, unspecified: Secondary | ICD-10-CM | POA: Diagnosis present

## 2018-10-07 DIAGNOSIS — M7989 Other specified soft tissue disorders: Secondary | ICD-10-CM | POA: Diagnosis present

## 2018-10-07 LAB — MYOCARDIAL PERFUSION IMAGING
CHL CUP RESTING HR STRESS: 60 {beats}/min
LV dias vol: 85 mL (ref 46–106)
LV sys vol: 39 mL
NUC STRESS TID: 1.14
Peak HR: 83 {beats}/min
SDS: 7
SRS: 9
SSS: 14

## 2018-10-07 MED ORDER — TECHNETIUM TC 99M TETROFOSMIN IV KIT
10.2000 | PACK | Freq: Once | INTRAVENOUS | Status: AC | PRN
  Administered 2018-10-07: 10.2 via INTRAVENOUS
  Filled 2018-10-07: qty 11

## 2018-10-07 MED ORDER — TECHNETIUM TC 99M TETROFOSMIN IV KIT
32.8000 | PACK | Freq: Once | INTRAVENOUS | Status: AC | PRN
  Administered 2018-10-07: 32.8 via INTRAVENOUS
  Filled 2018-10-07: qty 33

## 2018-10-07 MED ORDER — REGADENOSON 0.4 MG/5ML IV SOLN
0.4000 mg | Freq: Once | INTRAVENOUS | Status: AC
  Administered 2018-10-07: 0.4 mg via INTRAVENOUS

## 2018-10-13 ENCOUNTER — Encounter: Payer: Self-pay | Admitting: Cardiovascular Disease

## 2018-10-13 ENCOUNTER — Ambulatory Visit: Payer: Medicare Other | Admitting: Cardiovascular Disease

## 2018-10-13 VITALS — BP 166/92 | HR 63 | Ht 65.0 in | Wt 197.0 lb

## 2018-10-13 DIAGNOSIS — I10 Essential (primary) hypertension: Secondary | ICD-10-CM | POA: Diagnosis not present

## 2018-10-13 DIAGNOSIS — I2510 Atherosclerotic heart disease of native coronary artery without angina pectoris: Secondary | ICD-10-CM

## 2018-10-13 DIAGNOSIS — E7800 Pure hypercholesterolemia, unspecified: Secondary | ICD-10-CM | POA: Diagnosis not present

## 2018-10-13 DIAGNOSIS — Z5181 Encounter for therapeutic drug level monitoring: Secondary | ICD-10-CM | POA: Diagnosis not present

## 2018-10-13 MED ORDER — CLONIDINE HCL 0.2 MG PO TABS: 0.2000 mg | ORAL_TABLET | Freq: Three times a day (TID) | ORAL | 3 refills | Status: DC

## 2018-10-13 MED ORDER — ROSUVASTATIN CALCIUM 40 MG PO TABS: 40.0000 mg | ORAL_TABLET | Freq: Every day | ORAL | 3 refills | Status: DC

## 2018-10-13 NOTE — Progress Notes (Signed)
Cardiology Office Note   Date:  10/13/2018   ID:  Lauren Henry, DOB 05/25/1941, MRN [id number]  PCP:  Gaspar Garbe, MD  Cardiologist:   Chilton Si, MD   No chief complaint on file.    History of Present Illness: Lauren Henry is a 77 y.o. female with complete heart block s/p PPM, paroxysmal atrial fibrillation, and hypertension who is being seen today for the evaluation of hypertension at the request of Tisovec, Adelfa Koh, MD.  Lauren Henry had a pacemaker implanted by Dr. Ladona Ridgel in 2015 due to complete heart block.  She was minimally symptomatic and reports that it was incidentally found when she presented for knee surgery.  Since that time she has been doing well.  She continues to have difficult to manage hypertension.  She last followed up with Dr. Ladona Ridgel on 10/2017 and her blood pressure was elevated.  Carvedilol was switched to twice daily dosing from once daily long-acting dosing. However she reports that she has been taking all her carvedilol in the AM.  Her BP has been mostly within range in the moring and increases throughout the day.  It is typically around 120 systolic in the AM.  She has been working with Dr. Wylene Simmer and takes clonidine as needed for SBP >160 mmHg.  She has been taking this most days in the evening because her average BP has ranged from the 150s-180s.  She saw him 10/2017 and there was concern that anxiety may be contributing, as her AM BP is well controlled and it seems to increase throughout the day.  She was prescribed clonazepam but hasn't seen any improvement in her BP since starting this medication.  She notes that when her BP is very elevate she is unable to sleep.  She denies chest pain or shortness of breath.  Lauren Henry is very physically active.  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  She denies lower extremity edema, orthopnea or PND.    At her last appointment [name] had LE swelling and a rash that  were thought to be due to amlodipine.  This was discontinued and clonidine was increased.  She was also referred for an echo 09/13/18 that revealed LVEF 45-50% with diffuse hypokinesis and severe mitral annular calcification.  She was referred for a Lexiscan Myoview 10/07/18 that revealed a reversible defect in the inferior, apical and inferolateral regions.  This was associated with apical akinesis and inferoapical dyskinesis.  This was thought to be either ischemia or a pacemaker artifact.  She has been feeling well.  She has not had any chest pain and her breathing has been stable.  Her edema has improved since stopping amlodipine and she no longer has the rash.  Her blood pressure at home is been in the 140s over 60s.  She limits her salt intake and is trying to do more walking.   Past Medical History:  Diagnosis Date  . Arthritis   . Atrial fibrillation (HCC)   . Diarrhea    since gallbladder removed  . 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 History of blood transfusion as a child   no abnormal reaction noted  . History of colon polyps   . Hyperlipidemia    takes Pravastatin daily  . Hypertension    takes Azor and Metoprolol daily  . Hypothyroidism    takes Synthroid  daily  . Joint pain   . Presence of permanent cardiac pacemaker   . Urinary frequency   . Urinary urgency     Past Surgical History:  Procedure Laterality Date  . ABDOMINAL HYSTERECTOMY    . cataract surgery Bilateral   . CHOLECYSTECTOMY  1971  . COLONOSCOPY    . EYE SURGERY    . INSERT / REPLACE / REMOVE PACEMAKER     Medtronic PPM 11/01/14 (Dr. Lewayne Bunting)  . JOINT REPLACEMENT Right    knee  . PERMANENT PACEMAKER INSERTION N/A 11/01/2014   Procedure: PERMANENT PACEMAKER INSERTION;  Surgeon: Marinus Maw, MD;  Location: [facility] CATH LAB;  Service: Cardiovascular;  Laterality: N/A;  . stomach stapled  1985  . TOTAL KNEE ARTHROPLASTY Left 11/05/2015    Procedure: TOTAL KNEE ARTHROPLASTY;  Surgeon: Dannielle Huh, MD;  Location: [facility] OR;  Service: Orthopedics;  Laterality: Left;     Current Outpatient Medications  Medication Sig Dispense Refill  . acetaminophen (TYLENOL) 500 MG tablet Take 1,000 mg by mouth at bedtime as needed (pain). For leg pain    . apixaban (ELIQUIS) 5 MG TABS tablet Take 1 tablet (5 mg total) by mouth 2 (two) times daily. 180 tablet 1  . carvedilol (COREG CR) 40 MG 24 hr capsule Take 40 mg by mouth daily.    . Cholecalciferol (VITAMIN D3) 5000 UNITS CAPS Take 5,000 Units by mouth daily.    . cloNIDine (CATAPRES) 0.2 MG tablet Take 1 tablet (0.2 mg total) by mouth 3 (three) times daily. 270 tablet 3  . levothyroxine (SYNTHROID, LEVOTHROID) 150 MCG tablet Take 150 mcg by mouth. Take 1 tablet by mouth and take an extra 1/2 tablet on two days  0  . Multiple Vitamin (MULTIVITAMIN WITH MINERALS) TABS tablet Take 1 tablet by mouth daily.    Marland Kitchen olmesartan (BENICAR) 40 MG tablet Take 1 tablet (40 mg total) by mouth daily. 90 tablet 3  . spironolactone (ALDACTONE) 25 MG tablet Take 1 tablet by mouth daily.  0  . rosuvastatin (CRESTOR) 40 MG tablet Take 1 tablet (40 mg total) by mouth daily. 90 tablet 3   No current facility-administered medications for this visit.     Allergies:   Amlodipine    Social History:  The patient  reports that she has never smoked. She has never used smokeless tobacco. She reports that she drinks about 1.0 standard drinks of alcohol per week. She reports that she does not use drugs.   Family History:  The patient's family history includes Heart attack in her father; Heart disease in her father; Stroke in her sister.    ROS:  Please see the history of present illness.   Otherwise, review of systems are positive for burning in feet.   All other systems are reviewed and negative.    PHYSICAL EXAM: VS:  BP (!) 166/92 (BP Location: Right Arm, Patient Position: Sitting, Cuff Size: Normal)   Pulse 63   Ht  5\' 5"  (1.651 m)   Wt 197 lb (89.4 kg)   SpO2 99%   BMI 32.78 kg/m  , BMI Body mass index is 32.78 kg/m. GENERAL:  Well appearing HEENT: Pupils equal round and reactive, fundi not visualized, oral mucosa unremarkable NECK:  No jugular venous distention, waveform within normal limits, carotid upstroke brisk and symmetric, no bruits LUNGS:  Clear to auscultation bilaterally HEART:  RRR.  PMI not displaced or sustained,S1 and S2 within normal limits, no S3, no S4, no clicks, no rubs,  no murmurs ABD:  Flat, positive bowel sounds normal in frequency in pitch, no bruits, no rebound, no guarding, no midline pulsatile mass, no hepatomegaly, no splenomegaly EXT:  2 plus pulses throughout, tense, 1+ non-pitting edema to mid tibia bilaterally, no cyanosis no clubbing SKIN:  No rashes no nodules.  Hypopigmentation of both calves and tibia.  Excoriations and erythematous macules on lower legs. NEURO:  Cranial nerves II through XII grossly intact, motor grossly intact throughout PSYCH:  Cognitively intact, oriented to person place and time  EKG:  EKG is ordered today. 09/09/18:  ASVP.  Rate 69 bpm.  Echo 10/30/14: Study Conclusions  - Left ventricle: The cavity size was normal. There was mild focal basal hypertrophy of the septum. Systolic function was normal. The estimated ejection fraction was in the range of 60% to 65%. Wall motion was normal; there were no regional wall motion abnormalities. - Aortic valve: Trileaflet; mildly thickened, mildly calcified leaflets. Transvalvular velocity was minimally increased. There was no stenosis. - Mitral valve: Calcified annulus. There was mild regurgitation. - Right ventricle: The cavity size was mildly dilated. Wall thickness was normal.  Echo 09/13/18: Study Conclusions  - Left ventricle: The cavity size was normal. There was mild focal   basal hypertrophy of the septum. Systolic function was mildly   reduced. The estimated ejection  fraction was in the range of 45%   to 50%. Diffuse hypokinesis. Doppler parameters are consistent   with both elevated ventricular end-diastolic filling pressure and   elevated left atrial filling pressure. - Mitral valve: Severely calcified annulus. Mildly thickened,   mildly calcified leaflets . - Left atrium: The atrium was mildly dilated. - Atrial septum: No defect or patent foramen ovale was identified.  Lexiscan Myoview 10/07/18:   The left ventricular ejection fraction is mildly decreased (45-54%).  Nuclear stress EF: 54%.  No T wave inversion was noted during stress.  There was no ST segment deviation noted during stress.  Defect 1: There is a large defect of moderate severity.   Large size, moderate severity partially reversible inferior, apical and inferolateral perfusion defect, suggestive of ischemia. This could also represent pacemaker-related artifact. There is apical akinesis and inferoapical dyskinesis. LVEF 54%. This is an intermediate risk study. Clinical correlation is advised.   Recent Labs: No results found for requested labs within last 8760 hours.    Lipid Panel    Component Value Date/Time   CHOL 126 10/31/2014 0214   TRIG 83 10/31/2014 0214   HDL 49 10/31/2014 0214   CHOLHDL 2.6 10/31/2014 0214   VLDL 17 10/31/2014 0214   LDLCALC 60 10/31/2014 0214     01/21/18: Sodium 140, BUN 27, creatinine 1.2 AST TSH 5.03 07/15/17: Total cholesterol 169, triglycerides 89, HDL 52, LDL 99  Wt Readings from Last 3 Encounters:  10/13/18 197 lb (89.4 kg)  10/07/18 195 lb (88.5 kg)  09/09/18 195 lb 12.8 oz (88.8 kg)      ASSESSMENT AND PLAN:  # PAF: Currently in sinus rhythm.  Continue Eliquis and carvedilol.   # Hypertension: Blood pressure is elevated today.  Increase clonidine to 0.2 mg 3 times daily.  Continue carvedilol, olmesartan, and spironolactone.  # Presumed CAD:  Unclear if her Lexiscan Myoview abnormality is due to ischemia or pacemaker.   She has no chest pain or shortness of breath.  Her edema has improved since stopping amlodipine.  Her creatinine is elevated consistent with CKD 3.  Therefore it seems that the risk of left heart catheterization  outweighs the benefit.  We discussed this.  We will treat her for resumed CAD.  Given that she is already on Eliquis we will not start aspirin.  LDL was 99 12/2017.  Switch pravastatin to rosuvastatin with a goal of 70.  # CHB s/p PPM: Currently ASVP.  Managed by Dr. Ladona Ridgel.    # Hyperlipidemia: LDL goal is less than 70.  We will switch pravastatin to rosuvastatin.  Check lipids and a CMP in 6 to 8 weeks.  # Rash: # LE Edema: Resolved after stopping amlodipine.   Current medicines are reviewed at length with the patient today.  The patient does not have concerns regarding medicines.  The following changes have been made: Switch pravastatin to rosuvastatin. Labs/ tests ordered today include:   Orders Placed This Encounter  Procedures  . Lipid panel  . Comprehensive metabolic panel     Disposition:   FU with Symiah Nowotny C. Duke Salvia, MD, [facility] in 3 months.  PharmD in 1 month.    Time spent: 45 minutes-Greater than 50% of this time was spent in counseling, explanation of diagnosis, planning of further management, and coordination of care.    Signed, Greyson Riccardi C. Duke Salvia, MD, [facility]  10/13/2018 1:34 PM    [facility]

## 2018-10-13 NOTE — Patient Instructions (Addendum)
Medication Instructions:  INCREASE YOUR CLONIDINE TO 0.2 MG THREE TIMES A DAY   STOP PRAVASTATIN   START ROSUVASTATIN 40 MG DAILY   If you need a refill on your cardiac medications before your next appointment, please call your pharmacy.   Lab work: FASTING LP/CMET 6-8 WEEKS   If you have labs (blood work) drawn today and your tests are completely normal, you will receive your results only by: Marland Kitchen MyChart Message (if you have MyChart) OR . A paper copy in the mail If you have any lab test that is abnormal or we need to change your treatment, we will call you to review the results.  Testing/Procedures: NONE  Follow-Up: At [facility], you and your health needs are our priority.  As part of our continuing mission to provide you with exceptional heart care, we have created designated Provider Care Teams.  These Care Teams include your primary Cardiologist (physician) and Advanced Practice Providers (APPs -  Physician Assistants and Nurse Practitioners) who all work together to provide you with the care you need, when you need it. You will need a follow up appointment in 3 months. You may see Chilton Si, MD  or one of the following Advanced Practice Providers on your designated Care Team:   Corine Shelter, PA-C Judy Pimple, [place] . Marjie Skiff, PA-C  Your physician recommends that you schedule a follow-up appointment in: PHARM D 1 MONTH  LOG BLOOD PRESSURE AND BRING TO YOUR FOLLOW UP

## 2018-10-15 ENCOUNTER — Encounter (HOSPITAL_COMMUNITY): Payer: Medicare Other

## 2018-10-30 ENCOUNTER — Emergency Department (HOSPITAL_COMMUNITY): Payer: Medicare Other

## 2018-10-30 ENCOUNTER — Encounter (HOSPITAL_COMMUNITY): Payer: Self-pay

## 2018-10-30 ENCOUNTER — Inpatient Hospital Stay (HOSPITAL_COMMUNITY)
Admission: EM | Admit: 2018-10-30 | Discharge: 2018-11-03 | DRG: 287 | Disposition: A | Discharge status: Home / Self Care | Payer: Medicare Other | Attending: Internal Medicine | Admitting: Internal Medicine

## 2018-10-30 ENCOUNTER — Other Ambulatory Visit: Payer: Self-pay

## 2018-10-30 DIAGNOSIS — Z888 Allergy status to other drugs, medicaments and biological substances status: Secondary | ICD-10-CM

## 2018-10-30 DIAGNOSIS — Z90710 Acquired absence of both cervix and uterus: Secondary | ICD-10-CM

## 2018-10-30 DIAGNOSIS — I25110 Atherosclerotic heart disease of native coronary artery with unstable angina pectoris: Secondary | ICD-10-CM | POA: Diagnosis not present

## 2018-10-30 DIAGNOSIS — R778 Other specified abnormalities of plasma proteins: Secondary | ICD-10-CM

## 2018-10-30 DIAGNOSIS — R030 Elevated blood-pressure reading, without diagnosis of hypertension: Secondary | ICD-10-CM | POA: Diagnosis present

## 2018-10-30 DIAGNOSIS — R079 Chest pain, unspecified: Secondary | ICD-10-CM | POA: Diagnosis present

## 2018-10-30 DIAGNOSIS — Z79890 Hormone replacement therapy (postmenopausal): Secondary | ICD-10-CM

## 2018-10-30 DIAGNOSIS — I129 Hypertensive chronic kidney disease with stage 1 through stage 4 chronic kidney disease, or unspecified chronic kidney disease: Secondary | ICD-10-CM | POA: Diagnosis present

## 2018-10-30 DIAGNOSIS — Z823 Family history of stroke: Secondary | ICD-10-CM

## 2018-10-30 DIAGNOSIS — E039 Hypothyroidism, unspecified: Secondary | ICD-10-CM | POA: Diagnosis present

## 2018-10-30 DIAGNOSIS — Z79899 Other long term (current) drug therapy: Secondary | ICD-10-CM

## 2018-10-30 DIAGNOSIS — Z9049 Acquired absence of other specified parts of digestive tract: Secondary | ICD-10-CM

## 2018-10-30 DIAGNOSIS — D631 Anemia in chronic kidney disease: Secondary | ICD-10-CM | POA: Diagnosis present

## 2018-10-30 DIAGNOSIS — N183 Chronic kidney disease, stage 3 (moderate): Secondary | ICD-10-CM | POA: Diagnosis present

## 2018-10-30 DIAGNOSIS — I10 Essential (primary) hypertension: Secondary | ICD-10-CM

## 2018-10-30 DIAGNOSIS — Z8249 Family history of ischemic heart disease and other diseases of the circulatory system: Secondary | ICD-10-CM

## 2018-10-30 DIAGNOSIS — Z96653 Presence of artificial knee joint, bilateral: Secondary | ICD-10-CM | POA: Diagnosis present

## 2018-10-30 DIAGNOSIS — I959 Hypotension, unspecified: Secondary | ICD-10-CM | POA: Diagnosis not present

## 2018-10-30 DIAGNOSIS — R3915 Urgency of urination: Secondary | ICD-10-CM | POA: Diagnosis present

## 2018-10-30 DIAGNOSIS — I214 Non-ST elevation (NSTEMI) myocardial infarction: Secondary | ICD-10-CM | POA: Diagnosis not present

## 2018-10-30 DIAGNOSIS — I482 Chronic atrial fibrillation, unspecified: Secondary | ICD-10-CM | POA: Diagnosis present

## 2018-10-30 DIAGNOSIS — Z7901 Long term (current) use of anticoagulants: Secondary | ICD-10-CM

## 2018-10-30 DIAGNOSIS — I160 Hypertensive urgency: Secondary | ICD-10-CM | POA: Diagnosis not present

## 2018-10-30 DIAGNOSIS — Z8719 Personal history of other diseases of the digestive system: Secondary | ICD-10-CM

## 2018-10-30 DIAGNOSIS — E785 Hyperlipidemia, unspecified: Secondary | ICD-10-CM | POA: Diagnosis present

## 2018-10-30 DIAGNOSIS — R7989 Other specified abnormal findings of blood chemistry: Secondary | ICD-10-CM

## 2018-10-30 DIAGNOSIS — Z950 Presence of cardiac pacemaker: Secondary | ICD-10-CM

## 2018-10-30 DIAGNOSIS — I442 Atrioventricular block, complete: Secondary | ICD-10-CM | POA: Diagnosis present

## 2018-10-30 LAB — CBC WITH DIFFERENTIAL/PLATELET
ABS IMMATURE GRANULOCYTES: 0.03 10*3/uL (ref 0.00–0.07)
BASOS PCT: 1 %
Basophils Absolute: 0.1 10*3/uL (ref 0.0–0.1)
Eosinophils Absolute: 0.1 10*3/uL (ref 0.0–0.5)
Eosinophils Relative: 2 %
HCT: 39.3 % (ref 36.0–46.0)
Hemoglobin: 12.8 g/dL (ref 12.0–15.0)
Immature Granulocytes: 0 %
Lymphocytes Relative: 17 %
Lymphs Abs: 1.4 10*3/uL (ref 0.7–4.0)
MCH: 31.1 pg (ref 26.0–34.0)
MCHC: 32.6 g/dL (ref 30.0–36.0)
MCV: 95.6 fL (ref 80.0–100.0)
Monocytes Absolute: 0.5 10*3/uL (ref 0.1–1.0)
Monocytes Relative: 7 %
Neutro Abs: 5.9 10*3/uL (ref 1.7–7.7)
Neutrophils Relative %: 73 %
PLATELETS: 201 10*3/uL (ref 150–400)
RBC: 4.11 MIL/uL (ref 3.87–5.11)
RDW: 12.3 % (ref 11.5–15.5)
WBC: 8.1 10*3/uL (ref 4.0–10.5)
nRBC: 0 % (ref 0.0–0.2)

## 2018-10-30 LAB — CBC
HCT: 35.7 % — ABNORMAL LOW (ref 36.0–46.0)
Hemoglobin: 11.9 g/dL — ABNORMAL LOW (ref 12.0–15.0)
MCH: 31.2 pg (ref 26.0–34.0)
MCHC: 33.3 g/dL (ref 30.0–36.0)
MCV: 93.5 fL (ref 80.0–100.0)
Platelets: 185 10*3/uL (ref 150–400)
RBC: 3.82 MIL/uL — ABNORMAL LOW (ref 3.87–5.11)
RDW: 12.3 % (ref 11.5–15.5)
WBC: 6.9 10*3/uL (ref 4.0–10.5)
nRBC: 0 % (ref 0.0–0.2)

## 2018-10-30 LAB — TROPONIN I
Troponin I: 0.09 ng/mL (ref <0.03)
Troponin I: 0.21 ng/mL (ref <0.03)

## 2018-10-30 LAB — BASIC METABOLIC PANEL
ANION GAP: 9 (ref 5–15)
BUN: 21 mg/dL (ref 8–23)
CO2: 25 mmol/L (ref 22–32)
Calcium: 10.3 mg/dL (ref 8.9–10.3)
Chloride: 106 mmol/L (ref 98–111)
Creatinine, Ser: 1.45 mg/dL — ABNORMAL HIGH (ref 0.44–1.00)
GFR calc Af Amer: 39 mL/min — ABNORMAL LOW (ref >60)
GFR calc non Af Amer: 34 mL/min — ABNORMAL LOW (ref >60)
GLUCOSE: 106 mg/dL — AB (ref 70–99)
Potassium: 3.9 mmol/L (ref 3.5–5.1)
Sodium: 140 mmol/L (ref 135–145)

## 2018-10-30 LAB — LIPID PANEL
Cholesterol: 122 mg/dL (ref 0–200)
HDL: 52 mg/dL (ref >40)
LDL Cholesterol: 60 mg/dL (ref 0–99)
Total CHOL/HDL Ratio: 2.3 RATIO
Triglycerides: 51 mg/dL (ref <150)
VLDL: 10 mg/dL (ref 0–40)

## 2018-10-30 LAB — CREATININE, SERUM
Creatinine, Ser: 1.45 mg/dL — ABNORMAL HIGH (ref 0.44–1.00)
GFR calc Af Amer: 39 mL/min — ABNORMAL LOW (ref >60)
GFR calc non Af Amer: 34 mL/min — ABNORMAL LOW (ref >60)

## 2018-10-30 LAB — TSH: TSH: 4.554 u[IU]/mL — ABNORMAL HIGH (ref 0.350–4.500)

## 2018-10-30 LAB — MAGNESIUM: Magnesium: 1.8 mg/dL (ref 1.7–2.4)

## 2018-10-30 MED ORDER — IRBESARTAN 150 MG PO TABS: 300.0000 mg | ORAL_TABLET | Freq: Every day | ORAL | Status: DC

## 2018-10-30 MED ORDER — ASPIRIN 325 MG PO TABS: 325.0000 mg | ORAL_TABLET | Freq: Every day | ORAL | Status: DC

## 2018-10-30 MED ORDER — ZOLPIDEM TARTRATE 5 MG PO TABS: 5.0000 mg | ORAL_TABLET | Freq: Every evening | ORAL | Status: DC | PRN

## 2018-10-30 MED ORDER — CLONIDINE HCL 0.2 MG PO TABS
0.2000 mg | ORAL_TABLET | Freq: Three times a day (TID) | ORAL | Status: DC
  Administered 2018-10-30 – 2018-10-31 (×2): 0.2 mg via ORAL
  Filled 2018-10-30 (×3): qty 1

## 2018-10-30 MED ORDER — ROSUVASTATIN CALCIUM 20 MG PO TABS
40.0000 mg | ORAL_TABLET | Freq: Every day | ORAL | Status: DC
  Administered 2018-10-31 – 2018-11-03 (×4): 40 mg via ORAL
  Filled 2018-10-30 (×5): qty 2

## 2018-10-30 MED ORDER — HEPARIN (PORCINE) 25000 UT/250ML-% IV SOLN
750.0000 [IU]/h | INTRAVENOUS | Status: DC
  Administered 2018-10-30: 1000 [IU]/h via INTRAVENOUS
  Filled 2018-10-30 (×3): qty 250

## 2018-10-30 MED ORDER — HEPARIN SODIUM (PORCINE) 5000 UNIT/ML IJ SOLN: 5000.0000 [IU] | Freq: Three times a day (TID) | INTRAMUSCULAR | Status: DC

## 2018-10-30 MED ORDER — APIXABAN 5 MG PO TABS: 5.0000 mg | ORAL_TABLET | Freq: Two times a day (BID) | ORAL | Status: DC

## 2018-10-30 MED ORDER — ADULT MULTIVITAMIN W/MINERALS CH
1.0000 | ORAL_TABLET | Freq: Every day | ORAL | Status: DC
  Administered 2018-10-31 – 2018-11-03 (×4): 1 via ORAL
  Filled 2018-10-30 (×5): qty 1

## 2018-10-30 MED ORDER — SPIRONOLACTONE 25 MG PO TABS: 25.0000 mg | ORAL_TABLET | Freq: Every day | ORAL | Status: DC

## 2018-10-30 MED ORDER — LEVOTHYROXINE SODIUM 75 MCG PO TABS
150.0000 ug | ORAL_TABLET | Freq: Every day | ORAL | Status: DC
  Administered 2018-10-31 – 2018-11-03 (×4): 150 ug via ORAL
  Filled 2018-10-30 (×4): qty 2

## 2018-10-30 MED ORDER — HYDRALAZINE HCL 25 MG PO TABS: 25.0000 mg | ORAL_TABLET | ORAL | Status: DC | PRN

## 2018-10-30 MED ORDER — ASPIRIN 81 MG PO CHEW
324.0000 mg | CHEWABLE_TABLET | Freq: Once | ORAL | Status: AC
  Administered 2018-10-30: 324 mg via ORAL
  Filled 2018-10-30: qty 4

## 2018-10-30 MED ORDER — VITAMIN D 25 MCG (1000 UNIT) PO TABS
5000.0000 [IU] | ORAL_TABLET | Freq: Every day | ORAL | Status: DC
  Administered 2018-10-31 – 2018-11-03 (×3): 5000 [IU] via ORAL

## 2018-10-30 MED ORDER — CARVEDILOL PHOSPHATE ER 40 MG PO CP24
40.0000 mg | ORAL_CAPSULE | Freq: Every day | ORAL | Status: DC
  Administered 2018-10-31 – 2018-11-01 (×2): 40 mg via ORAL
  Filled 2018-10-30 (×2): qty 1

## 2018-10-30 NOTE — ED Notes (Signed)
Patient transported to X-ray 

## 2018-10-30 NOTE — ED Provider Notes (Signed)
[facility] EMERGENCY DEPARTMENT Provider Note   CSN: [id number] Arrival date & time: 10/30/18  1136     History   Chief Complaint Chief Complaint  Patient presents with  . Hypertension    HPI Lauren Henry is a 77 y.o. female.  HPI  77 year old female with a history of A. fib, difficult to control hypertension, and heart block with a pacemaker presents with hypertension.  She states she is been having difficulty control blood pressure for about 2 or 3 weeks.  Recently had some of her medications adjusted by Dr. Duke Salvia.  She states that despite this her blood pressure is consistently running 180, 190, or 200 systolic at home.  It was elevated when she checked it at home and so she went to the fire department to have it checked there as well.  She has done this before.  It was 200+ systolic and she was feeling on and off chest discomfort and some lightheadedness so EMS was called.  EMS reports unchanging blood pressure, about 208/100.  The patient states the chest discomfort is not really a pain but also not a heaviness.  It comes and goes very quickly.  Sometimes she is nauseated but other times there is no symptoms.  No shortness of breath or radiation of the pain.  No real headache but she feels lightheaded.  No blurry vision or focal weakness/numbness.  Currently besides lightheadedness is asymptomatic.  Past Medical History:  Diagnosis Date  . Arthritis   . Atrial fibrillation (HCC)   . Diarrhea    since gallbladder removed  . 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 History of blood transfusion as a child   no abnormal reaction noted  . History of colon polyps   . Hyperlipidemia    takes Pravastatin daily  . Hypertension    takes Azor and Metoprolol daily  . Hypothyroidism    takes Synthroid daily  . Joint pain   . Presence of permanent cardiac pacemaker   . Urinary frequency   . Urinary urgency      Patient Active Problem List   Diagnosis Date Noted  . Urinary urgency   . Urinary frequency   . Presence of permanent cardiac pacemaker   . Joint pain   . History of colon polyps   . History of blood transfusion   . Diarrhea   . Dysrhythmia   . Arthritis   . 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 Preop cardiovascular exam 10/18/2015  . Pacemaker 02/07/2015  . Chest pain 11/09/2014  . Atrial fibrillation (HCC) 11/09/2014  . Complete heart block by electrocardiogram (HCC) 10/30/2014  . Hypertension 10/30/2014  . Hyperlipidemia 10/30/2014  . Hypothyroidism 10/30/2014  . CHB (complete heart block) (HCC) 10/30/2014  . Third degree AV block (HCC)   . 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 Right-sided low back pain with right-sided sciatica 09/14/2014  . Knee pain 01/31/2013  . Back pain 08/27/2012  . Right groin pain 01/13/2012    Past Surgical History:  Procedure Laterality Date  . ABDOMINAL HYSTERECTOMY    . cataract surgery Bilateral   . CHOLECYSTECTOMY  1971  . COLONOSCOPY    . EYE SURGERY    . INSERT / REPLACE / REMOVE PACEMAKER     Medtronic PPM 11/01/14 (Dr. Lewayne Bunting)  . JOINT REPLACEMENT Right    knee  . PERMANENT PACEMAKER INSERTION N/A  11/01/2014   Procedure: PERMANENT PACEMAKER INSERTION;  Surgeon: Marinus Maw, MD;  Location: [facility] CATH LAB;  Service: Cardiovascular;  Laterality: N/A;  . stomach stapled  1985  . TOTAL KNEE ARTHROPLASTY Left 11/05/2015   Procedure: TOTAL KNEE ARTHROPLASTY;  Surgeon: Dannielle Huh, MD;  Location: [facility] OR;  Service: Orthopedics;  Laterality: Left;     OB History   None      Home Medications    Prior to Admission medications   Medication Sig Start Date End Date Taking? Authorizing Provider  acetaminophen (TYLENOL) 500 MG tablet Take 1,000 mg by mouth at bedtime as needed (pain). For leg pain   Yes [provider]  apixaban (ELIQUIS) 5 MG TABS tablet Take 1 tablet (5 mg total) by mouth  2 (two) times daily. 08/17/18  Yes Chilton Si, MD  carvedilol (COREG CR) 40 MG 24 hr capsule Take 40 mg by mouth daily.   Yes [provider]  Cholecalciferol (VITAMIN D3) 5000 UNITS CAPS Take 5,000 Units by mouth daily.   Yes [provider]  cloNIDine (CATAPRES) 0.2 MG tablet Take 1 tablet (0.2 mg total) by mouth 3 (three) times daily. 10/13/18  Yes Chilton Si, MD  levothyroxine (SYNTHROID, LEVOTHROID) 150 MCG tablet Take 150 mcg by mouth. Take 1 tablet by mouth and take an extra 1/2 tablet on two days 10/15/17  Yes [provider]  Multiple Vitamin (MULTIVITAMIN WITH MINERALS) TABS tablet Take 1 tablet by mouth daily.   Yes [provider]  olmesartan (BENICAR) 40 MG tablet Take 1 tablet (40 mg total) by mouth daily. 09/09/18  Yes Chilton Si, MD  rosuvastatin (CRESTOR) 40 MG tablet Take 1 tablet (40 mg total) by mouth daily. 10/13/18 01/11/19 Yes Chilton Si, MD  spironolactone (ALDACTONE) 25 MG tablet Take 25 mg by mouth daily.  12/12/17  Yes [provider]    Family History Family History  Problem Relation Age of Onset  . Heart disease Father   . Heart attack Father   . Stroke Sister     Social History Social History   Tobacco Use  . Smoking status: Never Smoker  . Smokeless tobacco: Never Used  Substance Use Topics  . Alcohol use: Yes    Alcohol/week: 1.0 standard drinks    Types: 1 Glasses of wine per week  . Drug use: No     Allergies   Amlodipine   Review of Systems Review of Systems  Eyes: Negative for visual disturbance.  Respiratory: Negative for shortness of breath.   Cardiovascular: Positive for chest pain.  Gastrointestinal: Positive for nausea. Negative for abdominal pain and vomiting.  Neurological: Positive for light-headedness. Negative for weakness and headaches.  All other systems reviewed and are negative.    Physical Exam Updated Vital Signs BP (!) 142/82   Pulse (!) 58    Temp 98 F (36.7 C)   Resp 19   Ht 5\' 5"  (1.651 m)   Wt 89.4 kg   SpO2 100%   BMI 32.78 kg/m   Physical Exam  Constitutional: She appears well-developed and well-nourished. No distress.  HENT:  Head: Normocephalic and atraumatic.  Right Ear: External ear normal.  Left Ear: External ear normal.  Nose: Nose normal.  Eyes: Pupils are equal, round, and reactive to light. EOM are normal. Right eye exhibits no discharge. Left eye exhibits no discharge.  Cardiovascular: Normal rate, regular rhythm and normal heart sounds.  Pulmonary/Chest: Effort normal and breath sounds normal.  Abdominal: Soft. There is  no tenderness.  Neurological: She is alert.  CN 3-12 grossly intact. 5/5 strength in all 4 extremities. Grossly normal sensation. Normal finger to nose.   Skin: Skin is warm and dry. She is not diaphoretic.  Psychiatric: Her mood appears not anxious.  Nursing note and vitals reviewed.    ED Treatments / Results  Labs (all labs ordered are listed, but only abnormal results are displayed) Labs Reviewed  BASIC METABOLIC PANEL - Abnormal; Notable for the following components:      Result Value   Glucose, Bld 106 (*)    Creatinine, Ser 1.45 (*)    GFR calc non Af Amer 34 (*)    GFR calc Af Amer 39 (*)    All other components within normal limits  TROPONIN I - Abnormal; Notable for the following components:   Troponin I 0.09 (*)    All other components within normal limits  TROPONIN I  CBC WITH DIFFERENTIAL/PLATELET    EKG EKG Interpretation  Date/Time:  Saturday October 30 2018 11:45:22 EST Ventricular Rate:  60 PR Interval:    QRS Duration: 158 QT Interval:  469 QTC Calculation: 469 R Axis:   -72 Text Interpretation:  Sinus rhythm LVH with IVCD, LAD and secondary repol abnrm no pacer spikes, but otherwise is unchanged compared to 2015 Confirmed by Pricilla Loveless [phone]) on 10/30/2018 11:51:55 AM   EKG Interpretation  Date/Time:  Saturday October 30 2018 14:52:55  EST Ventricular Rate:  60 PR Interval:    QRS Duration: 174 QT Interval:  522 QTC Calculation: 522 R Axis:   -75 Text Interpretation:  Age not entered, assumed to be  77 years old for purpose of ECG interpretation Sinus rhythm Short PR interval Left bundle branch block no significant change since earlier in the day Confirmed by Pricilla Loveless [phone]) on 10/30/2018 3:26:52 PM        Radiology Dg Chest 2 View  Result Date: 10/30/2018 CLINICAL DATA:  Pt arrived via [facility]. 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 Pt reports that she did have some chest pain this morning for a few seconds, non radiating. Pt states that her clonidine dose was increased from 2x to 3x per day three weeks ago. Initial blood pressure for EMS was 130/100. Hx of HTN, A-fib, dysrhythmia. EXAM: CHEST - 2 VIEW COMPARISON:  11/02/2014 FINDINGS: Cardiac silhouette is normal in size. Stable left anterior chest wall sequential pacemaker. No mediastinal or hilar masses. No evidence of adenopathy. There are prominent bronchovascular markings diffusely, stable. No evidence of pneumonia or pulmonary edema. No pleural effusion or pneumothorax. Skeletal structures are demineralized but intact. IMPRESSION: No active cardiopulmonary disease. Electronically Signed   By: Amie Portland M.D.   On: 10/30/2018 13:11   Ct Head Wo Contrast  Result Date: 10/30/2018 CLINICAL DATA:  Hypertension, suspected subarachnoid hemorrhage clinically. EXAM: CT HEAD WITHOUT CONTRAST TECHNIQUE: Contiguous axial images were obtained from the base of the skull through the vertex without intravenous contrast. COMPARISON:  None. FINDINGS: Brain: The brainstem, cerebellum, cerebral peduncles, thalami, basal ganglia, basilar cisterns, and ventricular system appear within normal limits. No intracranial hemorrhage, mass lesion, or acute CVA. Vascular: There is atherosclerotic calcification of the cavernous carotid  arteries bilaterally. Skull: Unremarkable Sinuses/Orbits: Chronic right maxillary sinusitis. Other: No supplemental non-categorized findings. IMPRESSION: 1. No acute intracranial findings. 2. There is atherosclerotic calcification of the cavernous carotid arteries bilaterally. 3. Chronic right maxillary sinusitis. Electronically Signed   By: Zollie Beckers  Ova Freshwater M.D.   On: 10/30/2018 13:44    Procedures .Critical Care Performed by: Pricilla Loveless, MD Authorized by: Pricilla Loveless, MD   Critical care provider statement:    Critical care time (minutes):  35   Critical care time was exclusive of:  Separately billable procedures and treating other patients   Critical care was necessary to treat or prevent imminent or life-threatening deterioration of the following conditions:  Cardiac failure   Critical care was time spent personally by me on the following activities:  Discussions with consultants, evaluation of patient's response to treatment, examination of patient, ordering and performing treatments and interventions, ordering and review of laboratory studies, ordering and review of radiographic studies, pulse oximetry, re-evaluation of patient's condition, obtaining history from patient or surrogate, review of old charts and development of treatment plan with patient or surrogate   (including critical care time)  Medications Ordered in ED Medications  aspirin chewable tablet 324 mg (324 mg Oral Given 10/30/18 1155)     Initial Impression / Assessment and Plan / ED Course  I have reviewed the triage vital signs and the nursing notes.  Pertinent labs & imaging results that were available during my care of the patient were reviewed by me and considered in my medical decision making (see chart for details).     Patient has not had any chest pain since arriving to the ED.  She does have some dizziness although this seems improved with steady decrease in blood pressure.  Initial troponin  negative.  Given the vague chest pain, second obtained given her history but this has now become mildly positive.  I discussed with Dr.Koneswaran of cardiology who is reviewed her chart.  No immediate cardiology intervention.  Thus I will consult the hospitalist for admission as I think she needs to have serial troponins and better management of her blood pressure.  She has just now taken her midday medicine and I think this will help control some of her BP.  Otherwise, she will need monitoring on telemetry.  Doubt dissection.  Final Clinical Impressions(s) / ED Diagnoses   Final diagnoses:  Elevated troponin    ED Discharge Orders    None       Pricilla Loveless, MD 10/30/18 [phone]

## 2018-10-30 NOTE — H&P (Signed)
History and Physical  PRISEIS CRATTY ZOX:[id number] DOB: 07-Dec-1941 DOA: 10/30/2018  Referring physician: ER physician PCP: Gaspar Garbe, MD  Outpatient Specialists: Cardiology Patient coming from: Home  Chief Complaint: Elevated blood pressure (systolic blood pressure of 240 mmHg) and vague chest pain.  HPI:  Patient is a 77 year old female with past medical history significant for hypothyroidism, status post pacemaker placement for heart block, hyperlipidemia, atrial fibrillation on Eliquis and joint pains.  Patient has had problems controlling her blood pressure and was being followed up by the cardiology team on outpatient basis.  Patient also underwent cardiac stress test on October 07, 2018 that was interpreted as intermediate risk.  Patient has been checking her blood pressure twice daily since being followed by the cardiology team for blood pressure control.  Patient noted earlier today that systolic blood pressure was 240 mmHg.  According to the patient and patient's husband, this was confirmed at the local fire station.  Based on accelerated blood pressure, patient was advised to come to the hospital for further assessment and management.  On further questioning, patient endorsed left-sided chest pain, dull and annoying, rated as 0-2 out of 10, and will last for seconds.  There is associated nausea, no associated shortness of breath, diaphoresis or dizziness.  First troponin was less than 0.03, but second troponin was 0.09.  ER provider has already consulted with the cardiology team.  No headache, no neck pain, no fever or chills, no shortness of breath, no vomiting, no urinary symptoms.  Current systolic blood pressure ranges from 140-170/82 mmHg.  ED Course: Cardiac enzymes have been cycled.  Blood pressure has been cautiously controlled.  Kindly see above. Pertinent labs: CBC reveals WBC of 8.1, hemoglobin of 12.8, hematocrit of 39.3, MCV of 95.6, platelet count of 201.  BMP revealed  sodium of 140, potassium of 2.9, chloride 106, CO2 25, BUN of 20, creatinine of 1.45 with blood sugar 106. EKG: Independently reviewed.  No new changes compared to old EKGs. Imaging: independently reviewed.  Chest x-ray has revealed no acute cardiopulmonary disease.  Review of Systems:  Negative for fever, visual changes, sore throat, rash, new muscle aches, chest pain, SOB, dysuria, bleeding, n/v/abdominal pain.  Past Medical History:  Diagnosis Date  . Arthritis   . Atrial fibrillation (HCC)   . Diarrhea    since gallbladder removed  . 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 History of blood transfusion as a child   no abnormal reaction noted  . History of colon polyps   . Hyperlipidemia    takes Pravastatin daily  . Hypertension    takes Azor and Metoprolol daily  . Hypothyroidism    takes Synthroid daily  . Joint pain   . Presence of permanent cardiac pacemaker   . Urinary frequency   . Urinary urgency     Past Surgical History:  Procedure Laterality Date  . ABDOMINAL HYSTERECTOMY    . cataract surgery Bilateral   . CHOLECYSTECTOMY  1971  . COLONOSCOPY    . EYE SURGERY    . INSERT / REPLACE / REMOVE PACEMAKER     Medtronic PPM 11/01/14 (Dr. Lewayne Bunting)  . JOINT REPLACEMENT Right    knee  . PERMANENT PACEMAKER INSERTION N/A 11/01/2014   Procedure: PERMANENT PACEMAKER INSERTION;  Surgeon: Marinus Maw, MD;  Location: [facility] CATH LAB;  Service: Cardiovascular;  Laterality: N/A;  . stomach stapled  1985  . TOTAL KNEE  ARTHROPLASTY Left 11/05/2015   Procedure: TOTAL KNEE ARTHROPLASTY;  Surgeon: Dannielle Huh, MD;  Location: [facility] OR;  Service: Orthopedics;  Laterality: Left;     reports that she has never smoked. She has never used smokeless tobacco. She reports that she drinks about 1.0 standard drinks of alcohol per week. She reports that she does not use drugs.  Allergies  Allergen Reactions  . Amlodipine Swelling and  Rash    Family History  Problem Relation Age of Onset  . Heart disease Father   . Heart attack Father   . Stroke Sister      Prior to Admission medications   Medication Sig Start Date End Date Taking? Authorizing Provider  acetaminophen (TYLENOL) 500 MG tablet Take 1,000 mg by mouth at bedtime as needed (pain). For leg pain   Yes [provider]  apixaban (ELIQUIS) 5 MG TABS tablet Take 1 tablet (5 mg total) by mouth 2 (two) times daily. 08/17/18  Yes Chilton Si, MD  carvedilol (COREG CR) 40 MG 24 hr capsule Take 40 mg by mouth daily.   Yes [provider]  Cholecalciferol (VITAMIN D3) 5000 UNITS CAPS Take 5,000 Units by mouth daily.   Yes [provider]  cloNIDine (CATAPRES) 0.2 MG tablet Take 1 tablet (0.2 mg total) by mouth 3 (three) times daily. 10/13/18  Yes Chilton Si, MD  levothyroxine (SYNTHROID, LEVOTHROID) 150 MCG tablet Take 150 mcg by mouth. Take 1 tablet by mouth and take an extra 1/2 tablet on two days 10/15/17  Yes [provider]  Multiple Vitamin (MULTIVITAMIN WITH MINERALS) TABS tablet Take 1 tablet by mouth daily.   Yes [provider]  olmesartan (BENICAR) 40 MG tablet Take 1 tablet (40 mg total) by mouth daily. 09/09/18  Yes Chilton Si, MD  rosuvastatin (CRESTOR) 40 MG tablet Take 1 tablet (40 mg total) by mouth daily. 10/13/18 01/11/19 Yes Chilton Si, MD  spironolactone (ALDACTONE) 25 MG tablet Take 25 mg by mouth daily.  12/12/17  Yes [provider]    Physical Exam: Vitals:   10/30/18 1415 10/30/18 1430 10/30/18 1610 10/30/18 1630  BP: (!) 146/75 (!) 142/82 (!) 176/71 (!) 171/83  Pulse: (!) 58 (!) 58 60 (!) 59  Resp: (!) 22 19 16 16   Temp:      SpO2: 98% 100% 98% 99%  Weight:      Height:         Constitutional:  . Appears calm and comfortable Eyes:  . No pallor. No jaundice.  ENMT:  . external ears, nose appear normal Neck:  . Neck is supple. No JVD Respiratory:   . CTA bilaterally, no w/r/r.  . Respiratory effort normal. No retractions or accessory muscle use Cardiovascular:  . S1S2 . No LE extremity edema   Abdomen:  . Abdomen is soft and non tender. Organs are difficult to assess. Neurologic:  . Awake and alert. . Moves all limbs.  Wt Readings from Last 3 Encounters:  10/30/18 89.4 kg  10/13/18 89.4 kg  10/07/18 88.5 kg    I have personally reviewed following labs and imaging studies  Labs on Admission:  CBC: Recent Labs  Lab 10/30/18 1153  WBC 8.1  NEUTROABS 5.9  HGB 12.8  HCT 39.3  MCV 95.6  PLT 201   Basic Metabolic Panel: Recent Labs  Lab 10/30/18 1153  NA 140  K 3.9  CL 106  CO2 25  GLUCOSE 106*  BUN 21  CREATININE 1.45*  CALCIUM 10.3  Liver Function Tests: No results for input(s): AST, ALT, ALKPHOS, BILITOT, PROT, ALBUMIN in the last 168 hours. No results for input(s): LIPASE, AMYLASE in the last 168 hours. No results for input(s): AMMONIA in the last 168 hours. Coagulation Profile: No results for input(s): INR, PROTIME in the last 168 hours. Cardiac Enzymes: Recent Labs  Lab 10/30/18 1153 10/30/18 1456  TROPONINI <0.03 0.09*   BNP (last 3 results) No results for input(s): PROBNP in the last 8760 hours. HbA1C: No results for input(s): HGBA1C in the last 72 hours. CBG: No results for input(s): GLUCAP in the last 168 hours. Lipid Profile: No results for input(s): CHOL, HDL, LDLCALC, TRIG, CHOLHDL, LDLDIRECT in the last 72 hours. Thyroid Function Tests: No results for input(s): TSH, T4TOTAL, FREET4, T3FREE, THYROIDAB in the last 72 hours. Anemia Panel: No results for input(s): VITAMINB12, FOLATE, FERRITIN, TIBC, IRON, RETICCTPCT in the last 72 hours. Urine analysis:    Component Value Date/Time   COLORURINE YELLOW 10/26/2015 0957   APPEARANCEUR CLOUDY (A) 10/26/2015 0957   LABSPEC 1.013 10/26/2015 0957   PHURINE 5.5 10/26/2015 0957   GLUCOSEU NEGATIVE 10/26/2015 0957   HGBUR NEGATIVE  10/26/2015 0957   BILIRUBINUR NEGATIVE 10/26/2015 0957   KETONESUR NEGATIVE 10/26/2015 0957   PROTEINUR NEGATIVE 10/26/2015 0957   UROBILINOGEN 0.2 10/26/2015 0957   NITRITE NEGATIVE 10/26/2015 0957   LEUKOCYTESUR TRACE (A) 10/26/2015 0957   Sepsis Labs: @LABRCNTIP (procalcitonin:4,lacticidven:4) )No results found for this or any previous visit (from the past 240 hour(s)).    Radiological Exams on Admission: Dg Chest 2 View  Result Date: 10/30/2018 CLINICAL DATA:  Pt arrived via [facility]. 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 Pt reports that she did have some chest pain this morning for a few seconds, non radiating. Pt states that her clonidine dose was increased from 2x to 3x per day three weeks ago. Initial blood pressure for EMS was 130/100. Hx of HTN, A-fib, dysrhythmia. EXAM: CHEST - 2 VIEW COMPARISON:  11/02/2014 FINDINGS: Cardiac silhouette is normal in size. Stable left anterior chest wall sequential pacemaker. No mediastinal or hilar masses. No evidence of adenopathy. There are prominent bronchovascular markings diffusely, stable. No evidence of pneumonia or pulmonary edema. No pleural effusion or pneumothorax. Skeletal structures are demineralized but intact. IMPRESSION: No active cardiopulmonary disease. Electronically Signed   By: Amie Portland M.D.   On: 10/30/2018 13:11   Ct Head Wo Contrast  Result Date: 10/30/2018 CLINICAL DATA:  Hypertension, suspected subarachnoid hemorrhage clinically. EXAM: CT HEAD WITHOUT CONTRAST TECHNIQUE: Contiguous axial images were obtained from the base of the skull through the vertex without intravenous contrast. COMPARISON:  None. FINDINGS: Brain: The brainstem, cerebellum, cerebral peduncles, thalami, basal ganglia, basilar cisterns, and ventricular system appear within normal limits. No intracranial hemorrhage, mass lesion, or acute CVA. Vascular: There is atherosclerotic calcification of the  cavernous carotid arteries bilaterally. Skull: Unremarkable Sinuses/Orbits: Chronic right maxillary sinusitis. Other: No supplemental non-categorized findings. IMPRESSION: 1. No acute intracranial findings. 2. There is atherosclerotic calcification of the cavernous carotid arteries bilaterally. 3. Chronic right maxillary sinusitis. Electronically Signed   By: Gaylyn Rong M.D.   On: 10/30/2018 13:44    EKG: Independently reviewed.   Active Problems:   Chest pain   Assessment/Plan Hypertensive urgency: Cautiously controlled patient's blood pressure, and avoid excessive drop. Monitor closely. May need to work patient up for secondary causes of hypertension.  NSTEMI: Cycle cardiac enzymes. Aspirin. Heparin. Cardiology has already been consulted.  Elevated troponin may be related to severity of blood pressure.  However, recent cardiac stress test is worrying.  Atrial fibrillation, chronic, on Eliquis prior to presentation: -Continue beta-blocker and Eliquis.  Hyperlipidemia: Fasting lipid profile  DVT prophylaxis: Patient was on Eliquis prior to presentation.  Patient be started on heparin drip. Code Status: Full code Family Communication: Husband Disposition Plan: Home eventually Consults called: ER physician has already consulted cardiology Admission status: Observation  Time spent: 62 minutes.     Berton Mount, MD  [facility] Pager #: [phone] 7PM-7AM contact night coverage as above  10/30/2018, 4:47 PM

## 2018-10-30 NOTE — ED Notes (Signed)
Admitting provider at bedside. Patient to be transported to floor when assessment completed.

## 2018-10-30 NOTE — Progress Notes (Signed)
ANTICOAGULATION CONSULT NOTE - Initial Consult  Pharmacy Consult for heparin Indication: chest pain/ACS  Allergies  Allergen Reactions  . Amlodipine Swelling and Rash    Patient Measurements: Height: 5\' 5"  (165.1 cm) Weight: 197 lb (89.4 kg) IBW/kg (Calculated) : 57 Heparin Dosing Weight: 76.7kg  Vital Signs: Temp: 98 F (36.7 C) (11/09 1145) BP: 171/83 (11/09 1630) Pulse Rate: 59 (11/09 1630)  Labs: Recent Labs    10/30/18 1153 10/30/18 1456  HGB 12.8  --   HCT 39.3  --   PLT 201  --   CREATININE 1.45*  --   TROPONINI <0.03 0.09*    Estimated Creatinine Clearance: 35.9 mL/min (A) (by C-G formula based on SCr of 1.45 mg/dL (H)).   Medical History: Past Medical History:  Diagnosis Date  . Arthritis   . Atrial fibrillation (HCC)   . Diarrhea    since gallbladder removed  . 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 History of blood transfusion as a child   no abnormal reaction noted  . History of colon polyps   . Hyperlipidemia    takes Pravastatin daily  . Hypertension    takes Azor and Metoprolol daily  . Hypothyroidism    takes Synthroid daily  . Joint pain   . Presence of permanent cardiac pacemaker   . Urinary frequency   . Urinary urgency     Medications:  Infusions:  . heparin      Assessment: 38 yof presented to the ED with HTN. Troponin elevated and now starting IV heparin. Pt is on apixaban PTA for history of afib. Last dose of apixaban was this morning at 9am. CBC is WNL and no bleeding noted.   Goal of Therapy:  Heparin level 0.3-0.7 units/ml Monitor platelets by anticoagulation protocol: Yes   Plan:  Heparin gtt 1000 units/hr - start at 9pm when next apixaban dose would have been due Check an 8 hr heparin level and aPTT Daily heparin level, aPTT and CBC  Jessieca Rhem, Drake Leach 10/30/2018,5:02 PM

## 2018-10-30 NOTE — ED Triage Notes (Addendum)
Pt arrived via [facility]. 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 Pt reports that she did have some chest pain this morning for a few seconds. Pt states that her clonidine dose was increased from 2x to 3x per day three weeks ago. Initial blood pressure for EMS was 230/100.

## 2018-10-30 NOTE — ED Notes (Signed)
Patient transported to CT 

## 2018-10-30 NOTE — ED Notes (Signed)
Pt took home blood pressure medications per ED physician.

## 2018-10-31 ENCOUNTER — Observation Stay (HOSPITAL_BASED_OUTPATIENT_CLINIC_OR_DEPARTMENT_OTHER): Payer: Medicare Other

## 2018-10-31 DIAGNOSIS — Z8719 Personal history of other diseases of the digestive system: Secondary | ICD-10-CM | POA: Diagnosis not present

## 2018-10-31 DIAGNOSIS — Z96653 Presence of artificial knee joint, bilateral: Secondary | ICD-10-CM | POA: Diagnosis present

## 2018-10-31 DIAGNOSIS — I10 Essential (primary) hypertension: Secondary | ICD-10-CM

## 2018-10-31 DIAGNOSIS — Z7901 Long term (current) use of anticoagulants: Secondary | ICD-10-CM | POA: Diagnosis not present

## 2018-10-31 DIAGNOSIS — I2510 Atherosclerotic heart disease of native coronary artery without angina pectoris: Secondary | ICD-10-CM | POA: Diagnosis not present

## 2018-10-31 DIAGNOSIS — E785 Hyperlipidemia, unspecified: Secondary | ICD-10-CM | POA: Diagnosis present

## 2018-10-31 DIAGNOSIS — R7989 Other specified abnormal findings of blood chemistry: Secondary | ICD-10-CM | POA: Diagnosis present

## 2018-10-31 DIAGNOSIS — Z823 Family history of stroke: Secondary | ICD-10-CM | POA: Diagnosis not present

## 2018-10-31 DIAGNOSIS — I160 Hypertensive urgency: Secondary | ICD-10-CM | POA: Diagnosis present

## 2018-10-31 DIAGNOSIS — Z950 Presence of cardiac pacemaker: Secondary | ICD-10-CM

## 2018-10-31 DIAGNOSIS — R030 Elevated blood-pressure reading, without diagnosis of hypertension: Secondary | ICD-10-CM | POA: Diagnosis present

## 2018-10-31 DIAGNOSIS — Z79890 Hormone replacement therapy (postmenopausal): Secondary | ICD-10-CM | POA: Diagnosis not present

## 2018-10-31 DIAGNOSIS — I959 Hypotension, unspecified: Secondary | ICD-10-CM | POA: Diagnosis not present

## 2018-10-31 DIAGNOSIS — Z888 Allergy status to other drugs, medicaments and biological substances status: Secondary | ICD-10-CM | POA: Diagnosis not present

## 2018-10-31 DIAGNOSIS — I200 Unstable angina: Secondary | ICD-10-CM | POA: Diagnosis not present

## 2018-10-31 DIAGNOSIS — Z9049 Acquired absence of other specified parts of digestive tract: Secondary | ICD-10-CM | POA: Diagnosis not present

## 2018-10-31 DIAGNOSIS — R3915 Urgency of urination: Secondary | ICD-10-CM | POA: Diagnosis present

## 2018-10-31 DIAGNOSIS — N183 Chronic kidney disease, stage 3 (moderate): Secondary | ICD-10-CM | POA: Diagnosis present

## 2018-10-31 DIAGNOSIS — Z90710 Acquired absence of both cervix and uterus: Secondary | ICD-10-CM | POA: Diagnosis not present

## 2018-10-31 DIAGNOSIS — R079 Chest pain, unspecified: Secondary | ICD-10-CM | POA: Diagnosis not present

## 2018-10-31 DIAGNOSIS — I214 Non-ST elevation (NSTEMI) myocardial infarction: Secondary | ICD-10-CM

## 2018-10-31 DIAGNOSIS — I482 Chronic atrial fibrillation, unspecified: Secondary | ICD-10-CM

## 2018-10-31 DIAGNOSIS — D631 Anemia in chronic kidney disease: Secondary | ICD-10-CM | POA: Diagnosis present

## 2018-10-31 DIAGNOSIS — I5030 Unspecified diastolic (congestive) heart failure: Secondary | ICD-10-CM | POA: Diagnosis not present

## 2018-10-31 DIAGNOSIS — R9439 Abnormal result of other cardiovascular function study: Secondary | ICD-10-CM

## 2018-10-31 DIAGNOSIS — Z8249 Family history of ischemic heart disease and other diseases of the circulatory system: Secondary | ICD-10-CM | POA: Diagnosis not present

## 2018-10-31 DIAGNOSIS — I25110 Atherosclerotic heart disease of native coronary artery with unstable angina pectoris: Secondary | ICD-10-CM | POA: Diagnosis present

## 2018-10-31 DIAGNOSIS — I442 Atrioventricular block, complete: Secondary | ICD-10-CM | POA: Diagnosis present

## 2018-10-31 DIAGNOSIS — I129 Hypertensive chronic kidney disease with stage 1 through stage 4 chronic kidney disease, or unspecified chronic kidney disease: Secondary | ICD-10-CM | POA: Diagnosis present

## 2018-10-31 DIAGNOSIS — Z79899 Other long term (current) drug therapy: Secondary | ICD-10-CM | POA: Diagnosis not present

## 2018-10-31 DIAGNOSIS — E039 Hypothyroidism, unspecified: Secondary | ICD-10-CM | POA: Diagnosis present

## 2018-10-31 DIAGNOSIS — I480 Paroxysmal atrial fibrillation: Secondary | ICD-10-CM

## 2018-10-31 LAB — COMPREHENSIVE METABOLIC PANEL
ALT: 17 U/L (ref 0–44)
AST: 20 U/L (ref 15–41)
Albumin: 3.6 g/dL (ref 3.5–5.0)
Alkaline Phosphatase: 44 U/L (ref 38–126)
Anion gap: 9 (ref 5–15)
BUN: 26 mg/dL — ABNORMAL HIGH (ref 8–23)
CO2: 24 mmol/L (ref 22–32)
Calcium: 9.9 mg/dL (ref 8.9–10.3)
Chloride: 106 mmol/L (ref 98–111)
Creatinine, Ser: 1.72 mg/dL — ABNORMAL HIGH (ref 0.44–1.00)
GFR calc Af Amer: 32 mL/min — ABNORMAL LOW (ref >60)
GFR calc non Af Amer: 27 mL/min — ABNORMAL LOW (ref >60)
Glucose, Bld: 95 mg/dL (ref 70–99)
Potassium: 3.8 mmol/L (ref 3.5–5.1)
Sodium: 139 mmol/L (ref 135–145)
Total Bilirubin: 0.6 mg/dL (ref 0.3–1.2)
Total Protein: 6.6 g/dL (ref 6.5–8.1)

## 2018-10-31 LAB — CBC
HEMATOCRIT: 33.9 % — AB (ref 36.0–46.0)
Hemoglobin: 11.3 g/dL — ABNORMAL LOW (ref 12.0–15.0)
MCH: 31.5 pg (ref 26.0–34.0)
MCHC: 33.3 g/dL (ref 30.0–36.0)
MCV: 94.4 fL (ref 80.0–100.0)
Platelets: 178 10*3/uL (ref 150–400)
RBC: 3.59 MIL/uL — ABNORMAL LOW (ref 3.87–5.11)
RDW: 12.4 % (ref 11.5–15.5)
WBC: 6.6 10*3/uL (ref 4.0–10.5)
nRBC: 0 % (ref 0.0–0.2)

## 2018-10-31 LAB — ECHOCARDIOGRAM COMPLETE
HEIGHTINCHES: 65 in
Weight: 3016 oz

## 2018-10-31 LAB — APTT
aPTT: 111 seconds — ABNORMAL HIGH (ref 24–36)
aPTT: 111 seconds — ABNORMAL HIGH (ref 24–36)

## 2018-10-31 LAB — TROPONIN I
TROPONIN I: 0.09 ng/mL — AB (ref <0.03)
TROPONIN I: 0.07 ng/mL — AB (ref <0.03)
TROPONIN I: 0.04 ng/mL — AB (ref <0.03)

## 2018-10-31 LAB — HEPARIN LEVEL (UNFRACTIONATED): Heparin Unfractionated: >2.2 IU/mL — ABNORMAL HIGH (ref 0.30–0.70)

## 2018-10-31 MED ORDER — SODIUM CHLORIDE 0.9% FLUSH: 3.0000 mL | Freq: Two times a day (BID) | INTRAVENOUS | Status: DC

## 2018-10-31 MED ORDER — SODIUM CHLORIDE 0.9 % IV SOLN
INTRAVENOUS | Status: DC
  Administered 2018-10-31 (×2): via INTRAVENOUS

## 2018-10-31 MED ORDER — HYDRALAZINE HCL 50 MG PO TABS
50.0000 mg | ORAL_TABLET | Freq: Three times a day (TID) | ORAL | Status: DC
  Administered 2018-10-31: 50 mg via ORAL
  Filled 2018-10-31 (×2): qty 1

## 2018-10-31 MED ORDER — SODIUM CHLORIDE 0.9 % IV SOLN: 250.0000 mL | INTRAVENOUS | Status: DC | PRN

## 2018-10-31 MED ORDER — ASPIRIN 81 MG PO CHEW
81.0000 mg | CHEWABLE_TABLET | ORAL | Status: AC
  Administered 2018-11-01: 81 mg via ORAL
  Filled 2018-10-31: qty 1

## 2018-10-31 MED ORDER — ASPIRIN EC 81 MG PO TBEC
81.0000 mg | DELAYED_RELEASE_TABLET | Freq: Every day | ORAL | Status: DC
  Administered 2018-10-31: 81 mg via ORAL
  Filled 2018-10-31 (×3): qty 1

## 2018-10-31 MED ORDER — SODIUM CHLORIDE 0.9% FLUSH: 3.0000 mL | INTRAVENOUS | Status: DC | PRN

## 2018-10-31 MED ORDER — SODIUM CHLORIDE 0.9 % IV BOLUS
1000.0000 mL | Freq: Once | INTRAVENOUS | Status: AC
  Administered 2018-10-31: 1000 mL via INTRAVENOUS

## 2018-10-31 NOTE — Consult Note (Addendum)
Cardiology Consultation:   Patient ID: [name] MRN: [id number]; DOB: 29-Jan-1941  Admit date: 10/30/2018 Date of Consult: 10/31/2018  Primary Care Provider: Gaspar Garbe, MD Primary Cardiologist: Chilton Si, MD   Primary Electrophysiologist:  Lewayne Bunting, MD     Patient Profile:   Lauren Henry is a 77 y.o. female with a hx of complete heart block s/p pacemaker, difficult to control hypertension, chronic kidney disease stage 3, paroxysmal atrial fibrillation on long term anticoagulation with Apixaban and presumed CAD who is being seen today for the evaluation of a non-ST elevation MI at the request of Dr. Berton Mount.  History of Present Illness:   [name] has recently been evaluated by Dr. Duke Salvia for difficult to control hypertension.  An echocardiogram in September 2019 demonstrated an EF of 45-50% with diffuse hypokinesis.  A nuclear stress test in October 2019 demonstrated an inferoapical and inferolateral defect felt to represent ischemia versus pacemaker artifact.  Given the patient's chronic kidney disease and no symptoms of angina, medical therapy was recommended.    She presented to the hospital yesterday for further evaluation of markedly elevated blood pressure.  Notes indicate her systolic blood pressure was 240 mmHg and she presented to the emergency room for further evaluation.  She has been checking her BP often.  Her BP has remained uncontrolle and she has an appointment with the PharmD clinic later this week.  She noticed some fleeting chest pain in her L chest yesterday when her BP was up.  She noted that this came and went several times.  She felt somewhat nauseated.  She denies associated shortness of breath, diaphoresis.  She was somewhat dizzy.  She denies syncope.  She has note noticed any recent dyspnea on exertion, paroxysmal nocturnal dyspnea.  Her leg edema resolved after stopping Amlodipine.  Initial troponin was <0.03.  This increased to  0.09 and most recently 0.21.  She is currently pain free.    Past Medical History:  Diagnosis Date  . Arthritis   . Atrial fibrillation (HCC)   . Diarrhea    since gallbladder removed  . 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 History of blood transfusion as a child   no abnormal reaction noted  . History of colon polyps   . Hyperlipidemia    takes Pravastatin daily  . Hypertension    takes Azor and Metoprolol daily  . Hypothyroidism    takes Synthroid daily  . Joint pain   . Presence of permanent cardiac pacemaker   . Urinary frequency   . Urinary urgency     Past Surgical History:  Procedure Laterality Date  . ABDOMINAL HYSTERECTOMY    . cataract surgery Bilateral   . CHOLECYSTECTOMY  1971  . COLONOSCOPY    . EYE SURGERY    . INSERT / REPLACE / REMOVE PACEMAKER     Medtronic PPM 11/01/14 (Dr. Lewayne Bunting)  . JOINT REPLACEMENT Right    knee  . PERMANENT PACEMAKER INSERTION N/A 11/01/2014   Procedure: PERMANENT PACEMAKER INSERTION;  Surgeon: Marinus Maw, MD;  Location: [facility] CATH LAB;  Service: Cardiovascular;  Laterality: N/A;  . stomach stapled  1985  . TOTAL KNEE ARTHROPLASTY Left 11/05/2015   Procedure: TOTAL KNEE ARTHROPLASTY;  Surgeon: Dannielle Huh, MD;  Location: [facility] OR;  Service: Orthopedics;  Laterality: Left;     Home Medications:  Prior to Admission medications   Medication Sig Start Date  End Date Taking? Authorizing Provider  acetaminophen (TYLENOL) 500 MG tablet Take 1,000 mg by mouth at bedtime as needed (pain). For leg pain   Yes [provider]  apixaban (ELIQUIS) 5 MG TABS tablet Take 1 tablet (5 mg total) by mouth 2 (two) times daily. 08/17/18  Yes Chilton Si, MD  carvedilol (COREG CR) 40 MG 24 hr capsule Take 40 mg by mouth daily.   Yes [provider]  Cholecalciferol (VITAMIN D3) 5000 UNITS CAPS Take 5,000 Units by mouth daily.   Yes [provider]  cloNIDine  (CATAPRES) 0.2 MG tablet Take 1 tablet (0.2 mg total) by mouth 3 (three) times daily. 10/13/18  Yes Chilton Si, MD  levothyroxine (SYNTHROID, LEVOTHROID) 150 MCG tablet Take 150 mcg by mouth. Take 1 tablet by mouth and take an extra 1/2 tablet on two days 10/15/17  Yes [provider]  Multiple Vitamin (MULTIVITAMIN WITH MINERALS) TABS tablet Take 1 tablet by mouth daily.   Yes [provider]  olmesartan (BENICAR) 40 MG tablet Take 1 tablet (40 mg total) by mouth daily. 09/09/18  Yes Chilton Si, MD  rosuvastatin (CRESTOR) 40 MG tablet Take 1 tablet (40 mg total) by mouth daily. 10/13/18 01/11/19 Yes Chilton Si, MD  spironolactone (ALDACTONE) 25 MG tablet Take 25 mg by mouth daily.  12/12/17  Yes [provider]    Inpatient Medications: Scheduled Meds: . aspirin  325 mg Oral Daily  . carvedilol  40 mg Oral Daily  . cholecalciferol  5,000 Units Oral Daily  . cloNIDine  0.2 mg Oral TID  . irbesartan  300 mg Oral Daily  . levothyroxine  150 mcg Oral QAC breakfast  . multivitamin with minerals  1 tablet Oral Daily  . rosuvastatin  40 mg Oral Daily  . spironolactone  25 mg Oral Daily   Continuous Infusions: . heparin 850 Units/hr (10/31/18 0650)   PRN Meds: hydrALAZINE, zolpidem  Allergies:    Allergies  Allergen Reactions  . Amlodipine Swelling and Rash    Social History:   Social History   Socioeconomic History  . Marital status: Married    Spouse name: Not on file  . Number of children: Not on file  . Years of education: Not on file  . Highest education level: Not on file  Occupational History  . Not on file  Social Needs  . Financial resource strain: Not on file  . Food insecurity:    Worry: Not on file    Inability: Not on file  . Transportation needs:    Medical: Not on file    Non-medical: Not on file  Tobacco Use  . Smoking status: Never Smoker  . Smokeless tobacco: Never Used  Substance and Sexual Activity  .  Alcohol use: Yes    Alcohol/week: 1.0 standard drinks    Types: 1 Glasses of wine per week  . Drug use: No  . Sexual activity: Not Currently    Birth control/protection: Surgical  Lifestyle  . Physical activity:    Days per week: Not on file    Minutes per session: Not on file  . Stress: Not on file  Relationships  . Social connections:    Talks on phone: Not on file    Gets together: Not on file    Attends religious service: Not on file    Active member of club or organization: Not on file    Attends meetings of clubs or organizations: Not on file    Relationship  status: Not on file  . Intimate partner violence:    Fear of current or ex partner: Not on file    Emotionally abused: Not on file    Physically abused: Not on file    Forced sexual activity: Not on file  Other Topics Concern  . Not on file  Social History Narrative  . Not on file    Family History:    Family History  Problem Relation Age of Onset  . Heart disease Father   . Heart attack Father   . Stroke Sister      ROS:  Please see the history of present illness.  All other ROS reviewed and negative.     Physical Exam/Data:   Vitals:   10/30/18 1734 10/30/18 1735 10/30/18 2110 10/31/18 0429  BP:  (!) 171/85 136/65 124/63  Pulse:  60  60  Resp:  18  18  Temp:  98 F (36.7 C) 97.9 F (36.6 C) (!) 97.5 F (36.4 C)  TempSrc:  Oral Oral Oral  SpO2:  99%  98%  Weight:  85.8 kg  85.5 kg  Height: 5\' 5"  (1.651 m)       Intake/Output Summary (Last 24 hours) at 10/31/2018 0758 Last data filed at 10/31/2018 0400 Gross per 24 hour  Intake 66.23 ml  Output -  Net 66.23 ml   Filed Weights   10/30/18 1147 10/30/18 1735 10/31/18 0429  Weight: 89.4 kg 85.8 kg 85.5 kg   Body mass index is 31.37 kg/m.  General:  Well nourished, well developed, in no acute distress  HEENT: normal Lymph: no adenopathy Neck: no JVD Endocrine:  No thryomegaly Vascular: No carotid bruits; DP 2+ bilat  Cardiac:  normal  S1, S2; RRR; 1/6 early/brief systolic murmur RUSB Lungs:  clear to auscultation bilaterally, no wheezing, rhonchi or rales  Abd: soft, nontender, no hepatomegaly  Ext: no edema Musculoskeletal:  No deformities  Skin: warm and dry  Neuro:  CNs 2-12 intact, no focal abnormalities noted Psych:  Normal affect   EKG:  The EKG was personally reviewed and demonstrates:  V paced Telemetry:  Telemetry was personally reviewed and demonstrates:  A sensed V paced   Relevant CV Studies:  Nuclear stress test 10/07/2018 Large size, moderate severity partially reversible inferior, apical and inferolateral perfusion defect, suggestive of ischemia. This could also represent pacemaker-related artifact. There is apical akinesis and inferoapical dyskinesis. LVEF 54%. This is an intermediate risk study. Clinical correlation is advised.  Echocardiogram 09/13/2018 - Left ventricle: The cavity size was normal. There was mild focal   basal hypertrophy of the septum. Systolic function was mildly   reduced. The estimated ejection fraction was in the range of 45%   to 50%. Diffuse hypokinesis. Doppler parameters are consistent   with both elevated ventricular end-diastolic filling pressure and   elevated left atrial filling pressure. - Mitral valve: Severely calcified annulus. Mildly thickened,   mildly calcified leaflets . - Left atrium: The atrium was mildly dilated. - Atrial septum: No defect or patent foramen ovale was identified.  Laboratory Data:  Chemistry Recent Labs  Lab 10/30/18 1153 10/30/18 1852 10/31/18 0348  NA 140  --  139  K 3.9  --  3.8  CL 106  --  106  CO2 25  --  24  GLUCOSE 106*  --  95  BUN 21  --  26*  CREATININE 1.45* 1.45* 1.72*  CALCIUM 10.3  --  9.9  GFRNONAA 34* 34* 27*  GFRAA  39* 39* 32*  ANIONGAP 9  --  9    Recent Labs  Lab 10/31/18 0348  PROT 6.6  ALBUMIN 3.6  AST 20  ALT 17  ALKPHOS 44  BILITOT 0.6   Hematology Recent Labs  Lab 10/30/18 1153  10/30/18 1852 10/31/18 0348  WBC 8.1 6.9 6.6  RBC 4.11 3.82* 3.59*  HGB 12.8 11.9* 11.3*  HCT 39.3 35.7* 33.9*  MCV 95.6 93.5 94.4  MCH 31.1 31.2 31.5  MCHC 32.6 33.3 33.3  RDW 12.3 12.3 12.4  PLT 201 185 178   Cardiac Enzymes Recent Labs  Lab 10/30/18 1153 10/30/18 1456 10/30/18 1722  TROPONINI <0.03 0.09* 0.21*      Radiology/Studies:  Dg Chest 2 View  Result Date: 10/30/2018 CLINICAL DATA:  Pt arrived via [facility]. 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 Pt reports that she did have some chest pain this morning for a few seconds, non radiating. Pt states that her clonidine dose was increased from 2x to 3x per day three weeks ago. Initial blood pressure for EMS was 130/100. Hx of HTN, A-fib, dysrhythmia. EXAM: CHEST - 2 VIEW COMPARISON:  11/02/2014 FINDINGS: Cardiac silhouette is normal in size. Stable left anterior chest wall sequential pacemaker. No mediastinal or hilar masses. No evidence of adenopathy. There are prominent bronchovascular markings diffusely, stable. No evidence of pneumonia or pulmonary edema. No pleural effusion or pneumothorax. Skeletal structures are demineralized but intact. IMPRESSION: No active cardiopulmonary disease. Electronically Signed   By: Amie Portland M.D.   On: 10/30/2018 13:11   Ct Head Wo Contrast  Result Date: 10/30/2018 CLINICAL DATA:  Hypertension, suspected subarachnoid hemorrhage clinically. EXAM: CT HEAD WITHOUT CONTRAST TECHNIQUE: Contiguous axial images were obtained from the base of the skull through the vertex without intravenous contrast. COMPARISON:  None. FINDINGS: Brain: The brainstem, cerebellum, cerebral peduncles, thalami, basal ganglia, basilar cisterns, and ventricular system appear within normal limits. No intracranial hemorrhage, mass lesion, or acute CVA. Vascular: There is atherosclerotic calcification of the cavernous carotid arteries bilaterally. Skull: Unremarkable  Sinuses/Orbits: Chronic right maxillary sinusitis. Other: No supplemental non-categorized findings. IMPRESSION: 1. No acute intracranial findings. 2. There is atherosclerotic calcification of the cavernous carotid arteries bilaterally. 3. Chronic right maxillary sinusitis. Electronically Signed   By: Gaylyn Rong M.D.   On: 10/30/2018 13:44    Assessment and Plan:   1. NSTEMI - She has a hx of presumed CAD based upon a recent nuclear stress test and mildly reduced LVF on recent echo.  She did not undergo cardiac catheterization due to CKD and a lack of anginal symptoms.  She now presents with L chest pain and somewhat elevated Troponin levels.  They are trending up, suggesting an acute coronary syndrome.  She will need a Cardiac Catheterization for further evaluation and management.  Given her CKD, she is at risk for contrast induced nephropathy.  She will need IVFs and we will need to hold her ARB and aldosterone antagonist.  Risks and benefits of cardiac catheterization have been discussed with the patient.  These include bleeding, infection, kidney damage, stroke, heart attack, death.  The patient understands these risks and is willing to proceed.   -Hold Avapro, Spironolactone  -Start IVFs 75 cc/hour  -Continue Heparin, ASA, statin  -Consider Cardiac Catheterization tomorrow depending on Creatinine  2. Chronic Kidney Disease - Creatinine today 1.7.  She will need IV hydration and to hold nephrotoxic agents in order to reduce her risk  of contrast induced nephropathy.  -IVFs at 75 cc/hour  -Hold Avapro, Spironolactone  -Tentative Cardiac Catheterization tomorrow for NSTEMI  -Will need repeat BMET in AM to decide if ok to proceed with LHC  3. HTN - Continue Coreg.  Hold Avapro and Spironolactone for LHC.  Start Hydralazine 50 mg 3x/day to control BP while off Avapro, Spironolactone.  We could add Isosorbide if needed.   4. Paroxsymal AF - Maintaining NSR.  Apixaban on hold.  She is now on  Heparin per pharmacy.    5. S/p Pacer - V paced on Tele.  Dispo:  We will follow.  Attending MD to see.  For questions or updates, please contact [facility] Please consult [email or web address] for contact info under     Signed, Tereso Newcomer, PA-C  10/31/2018 7:58 AM   The patient was seen and examined, and I agree with the history, physical exam, assessment and plan as documented above, with modifications as noted below. I have also personally reviewed all relevant documentation, old records, labs, and both radiographic and cardiovascular studies. I have also independently interpreted old and new ECG's.  Briefly, this is a 77 year old woman with cardiac history as noted above with complete heart block status post pacemaker, resistant hypertension, chronic kidney disease stage III, and paroxysmal atrial fibrillation.  Nuclear stress test reviewed above demonstrating inferoapical and inferolateral defect indicative of ischemia versus pacemaker artifact.  Medical therapy was pursued.  Yesterday morning she had some sharp pains beneath the left breast.  She checked her blood pressure with her wrist cuff and systolic readings were over 200.  She then went to the fire department in [place] and had her blood pressure checked where systolic readings were greater than 220.  Amlodipine was recently discontinued due to leg edema.  She was instructed to come to the ED.  Troponins were found to be mildly elevated at 0.09 and 0.21.  She currently denies chest pain.  While mild troponin elevation could be is consistent with demand ischemia in the context of severe hypertension, coronary artery disease will need to be ruled out given symptoms and abnormal stress test.  Creatinine is elevated and thus Avapro and spironolactone will be held.  We will start hydralazine for blood pressure control.  We will start gentle IV fluid hydration.  Continue aspirin, IV heparin, carvedilol, and statin.  Apixaban  on hold.  We will tentatively plan on cardiac catheterization tomorrow pending reassessment of renal function.   Prentice Docker, MD, [facility]  10/31/2018 9:07 AM

## 2018-10-31 NOTE — Progress Notes (Signed)
Family notified nurse patient c/o dizziness; BP 67/47, Hr 60 AV paced.  Patient had been up to BR and became dizzy after returning to bed.  Dr. Dartha Lodge notified, order for IV NS bolus 1L and d/c clonidine and hydralazine po meds.  IVF infusing at this time.  Will continue to monitor BP.

## 2018-10-31 NOTE — Progress Notes (Signed)
ANTICOAGULATION CONSULT NOTE   Pharmacy Consult for Heparin Indication: chest pain/ACS  Allergies  Allergen Reactions  . Amlodipine Swelling and Rash    Patient Measurements: Height: 5\' 5"  (165.1 cm) Weight: 188 lb 8 oz (85.5 kg) IBW/kg (Calculated) : 57 Heparin Dosing Weight: 76.7kg  Vital Signs: Temp: 97.8 F (36.6 C) (11/10 1314) Temp Source: Oral (11/10 1314) BP: 107/54 (11/10 1438) Pulse Rate: 59 (11/10 1314)  Labs: Recent Labs    10/30/18 1153  10/30/18 1722 10/30/18 1852 10/31/18 0348 10/31/18 0727 10/31/18 1302 10/31/18 1446  HGB 12.8  --   --  11.9* 11.3*  --   --   --   HCT 39.3  --   --  35.7* 33.9*  --   --   --   PLT 201  --   --  185 178  --   --   --   APTT  --   --   --   --  111*  --   --  111*  HEPARINUNFRC  --   --   --   --  >2.20*  --   --   --   CREATININE 1.45*  --   --  1.45* 1.72*  --   --   --   TROPONINI <0.03   < > 0.21*  --   --  0.09* 0.07*  --    < > = values in this interval not displayed.    Estimated Creatinine Clearance: 29.6 mL/min (A) (by C-G formula based on SCr of 1.72 mg/dL (H)).   Medical History: Past Medical History:  Diagnosis Date  . Arthritis   . Atrial fibrillation (HCC)   . Diarrhea    since gallbladder removed  . 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 History of blood transfusion as a child   no abnormal reaction noted  . History of colon polyps   . Hyperlipidemia    takes Pravastatin daily  . Hypertension    takes Azor and Metoprolol daily  . Hypothyroidism    takes Synthroid daily  . Joint pain   . Presence of permanent cardiac pacemaker   . Urinary frequency   . Urinary urgency     Medications:  Infusions:  . sodium chloride    . sodium chloride 999 mL/hr at 10/31/18 1411  . heparin 850 Units/hr (10/31/18 1411)    Assessment: 77 yof presented to the ED with HTN. Troponin elevated and now starting IV heparin. Pt is on apixaban PTA for  history of afib. Last dose of apixaban was this morning at 9am. CBC is WNL and no bleeding noted.   Despite rate adjustment aPTT remains high. Infusion adjusted appropriately this morning by RN and no S/Sx bleeding noted - will reduce further.  Goal of Therapy: APTT 66-102 secs  Heparin level 0.3-0.7 units/ml Monitor platelets by anticoagulation protocol: Yes   Plan:  -Decrease heparin to 750 units/hr -Recheck aPTT with am labs  Fredonia Highland, PharmD, BCPS Clinical Pharmacist [phone] Please check AMION for all [facility] numbers 10/31/2018

## 2018-10-31 NOTE — Progress Notes (Signed)
  Echocardiogram 2D Echocardiogram has been performed.  Delcie Roch 10/31/2018, 12:27 PM

## 2018-10-31 NOTE — Progress Notes (Signed)
PROGRESS NOTE    Lauren Henry  ONG:[id number] DOB: 1941/08/29 DOA: 10/30/2018 PCP: Gaspar Garbe, MD  Outpatient Specialists:   Brief Narrative:  Patient is a 77 year old female with past medical history significant for hypothyroidism, status post pacemaker placement for heart block, hyperlipidemia, atrial fibrillation on Eliquis and joint pains.  Patient was admitted with NSTEMI, vague chest pain and uncontrolled blood pressure.  Elevated troponin.  Recent history of abnormal cardiac stress test.  Cardiology input is appreciated.  Likely cardiac catheterization in a.m.    Assessment & Plan:   Active Problems:   Chest pain  Hypertensive urgency: Cautiously controlled patient's blood pressure, and avoid excessive drop. Monitor closely. May need to work patient up for secondary causes of hypertension if blood pressure control is difficult to achieve..  NSTEMI: Cycle cardiac enzymes. Aspirin. Heparin. Cardiology has already been consulted. Elevated troponin may be related to severity of blood pressure.  However, recent cardiac stress test is worrying. 10/31/2018: For cardiac catheterization in the morning.  Chronic kidney disease stage III: Patient is currently on gentle hydration as cardiac catheter is planned in the morning Monitor renal function closely post cardiac   Atrial fibrillation, chronic, on Eliquis prior to presentation: -Continue beta-blocker and Eliquis.  Hyperlipidemia: Fasting lipid profile  DVT prophylaxis: Patient was on Eliquis prior to presentation.  Patient be started on heparin drip. Code Status: Full code Family Communication: Husband Disposition Plan: Home eventually  Consultants:   Cardiology  Procedures:   Cardiac cath is planned for tomorrow morning  Antimicrobials:   None   Subjective: No chest pain. Blood pressure control is improving.  Objective: Vitals:   10/30/18 1735 10/30/18 2110 10/31/18 0429 10/31/18 0800  BP:  (!) 171/85 136/65 124/63 (!) 143/84  Pulse: 60  60 (!) 58  Resp: 18  18 16   Temp: 98 F (36.7 C) 97.9 F (36.6 C) (!) 97.5 F (36.4 C) 97.7 F (36.5 C)  TempSrc: Oral Oral Oral Oral  SpO2: 99%  98% 96%  Weight: 85.8 kg  85.5 kg   Height:        Intake/Output Summary (Last 24 hours) at 10/31/2018 1025 Last data filed at 10/31/2018 0400 Gross per 24 hour  Intake 66.23 ml  Output -  Net 66.23 ml   Filed Weights   10/30/18 1147 10/30/18 1735 10/31/18 0429  Weight: 89.4 kg 85.8 kg 85.5 kg    Examination:  General exam: Appears calm and comfortable  Respiratory system: Clear to auscultation. Respiratory effort normal. Cardiovascular system: S1 & S2 heard.  No pedal edema. Gastrointestinal system: Abdomen is nondistended, soft and nontender. No organomegaly or masses felt. Normal bowel sounds heard. Central nervous system: Alert and oriented. No focal neurological deficits. Extremities: Symmetric 5 x 5 power.   Data Reviewed: I have personally reviewed following labs and imaging studies  CBC: Recent Labs  Lab 10/30/18 1153 10/30/18 1852 10/31/18 0348  WBC 8.1 6.9 6.6  NEUTROABS 5.9  --   --   HGB 12.8 11.9* 11.3*  HCT 39.3 35.7* 33.9*  MCV 95.6 93.5 94.4  PLT 201 185 178   Basic Metabolic Panel: Recent Labs  Lab 10/30/18 1153 10/30/18 1852 10/31/18 0348  NA 140  --  139  K 3.9  --  3.8  CL 106  --  106  CO2 25  --  24  GLUCOSE 106*  --  95  BUN 21  --  26*  CREATININE 1.45* 1.45* 1.72*  CALCIUM 10.3  --  9.9  MG  --  1.8  --    GFR: Estimated Creatinine Clearance: 29.6 mL/min (A) (by C-G formula based on SCr of 1.72 mg/dL (H)). Liver Function Tests: Recent Labs  Lab 10/31/18 0348  AST 20  ALT 17  ALKPHOS 44  BILITOT 0.6  PROT 6.6  ALBUMIN 3.6   No results for input(s): LIPASE, AMYLASE in the last 168 hours. No results for input(s): AMMONIA in the last 168 hours. Coagulation Profile: No results for input(s): INR, PROTIME in the last 168  hours. Cardiac Enzymes: Recent Labs  Lab 10/30/18 1153 10/30/18 1456 10/30/18 1722 10/31/18 0727  TROPONINI <0.03 0.09* 0.21* 0.09*   BNP (last 3 results) No results for input(s): PROBNP in the last 8760 hours. HbA1C: No results for input(s): HGBA1C in the last 72 hours. CBG: No results for input(s): GLUCAP in the last 168 hours. Lipid Profile: Recent Labs    10/30/18 1852  CHOL 122  HDL 52  LDLCALC 60  TRIG 51  CHOLHDL 2.3   Thyroid Function Tests: Recent Labs    10/30/18 1852  TSH 4.554*   Anemia Panel: No results for input(s): VITAMINB12, FOLATE, FERRITIN, TIBC, IRON, RETICCTPCT in the last 72 hours. Urine analysis:    Component Value Date/Time   COLORURINE YELLOW 10/26/2015 0957   APPEARANCEUR CLOUDY (A) 10/26/2015 0957   LABSPEC 1.013 10/26/2015 0957   PHURINE 5.5 10/26/2015 0957   GLUCOSEU NEGATIVE 10/26/2015 0957   HGBUR NEGATIVE 10/26/2015 0957   BILIRUBINUR NEGATIVE 10/26/2015 0957   KETONESUR NEGATIVE 10/26/2015 0957   PROTEINUR NEGATIVE 10/26/2015 0957   UROBILINOGEN 0.2 10/26/2015 0957   NITRITE NEGATIVE 10/26/2015 0957   LEUKOCYTESUR TRACE (A) 10/26/2015 0957   Sepsis Labs: @LABRCNTIP (procalcitonin:4,lacticidven:4)  )No results found for this or any previous visit (from the past 240 hour(s)).       Radiology Studies: Dg Chest 2 View  Result Date: 10/30/2018 CLINICAL DATA:  Pt arrived via [facility]. 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 Pt reports that she did have some chest pain this morning for a few seconds, non radiating. Pt states that her clonidine dose was increased from 2x to 3x per day three weeks ago. Initial blood pressure for EMS was 130/100. Hx of HTN, A-fib, dysrhythmia. EXAM: CHEST - 2 VIEW COMPARISON:  11/02/2014 FINDINGS: Cardiac silhouette is normal in size. Stable left anterior chest wall sequential pacemaker. No mediastinal or hilar masses. No evidence of adenopathy. There  are prominent bronchovascular markings diffusely, stable. No evidence of pneumonia or pulmonary edema. No pleural effusion or pneumothorax. Skeletal structures are demineralized but intact. IMPRESSION: No active cardiopulmonary disease. Electronically Signed   By: Amie Portland M.D.   On: 10/30/2018 13:11   Ct Head Wo Contrast  Result Date: 10/30/2018 CLINICAL DATA:  Hypertension, suspected subarachnoid hemorrhage clinically. EXAM: CT HEAD WITHOUT CONTRAST TECHNIQUE: Contiguous axial images were obtained from the base of the skull through the vertex without intravenous contrast. COMPARISON:  None. FINDINGS: Brain: The brainstem, cerebellum, cerebral peduncles, thalami, basal ganglia, basilar cisterns, and ventricular system appear within normal limits. No intracranial hemorrhage, mass lesion, or acute CVA. Vascular: There is atherosclerotic calcification of the cavernous carotid arteries bilaterally. Skull: Unremarkable Sinuses/Orbits: Chronic right maxillary sinusitis. Other: No supplemental non-categorized findings. IMPRESSION: 1. No acute intracranial findings. 2. There is atherosclerotic calcification of the cavernous carotid arteries bilaterally. 3. Chronic right maxillary sinusitis. Electronically Signed   By: Gaylyn Rong M.D.   On:  10/30/2018 13:44        Scheduled Meds: . [START ON 11/01/2018] aspirin  81 mg Oral Pre-Cath  . aspirin EC  81 mg Oral Daily  . carvedilol  40 mg Oral Daily  . cholecalciferol  5,000 Units Oral Daily  . cloNIDine  0.2 mg Oral TID  . hydrALAZINE  50 mg Oral Q8H  . levothyroxine  150 mcg Oral QAC breakfast  . multivitamin with minerals  1 tablet Oral Daily  . rosuvastatin  40 mg Oral Daily  . sodium chloride flush  3 mL Intravenous Q12H   Continuous Infusions: . sodium chloride    . sodium chloride    . heparin 850 Units/hr (10/31/18 0650)     LOS: 0 days    Time spent: 25 minutes.    Berton Mount, MD  [facility] Pager #: [phone] 7PM-7AM contact night coverage as above

## 2018-10-31 NOTE — Progress Notes (Signed)
ANTICOAGULATION CONSULT NOTE   Pharmacy Consult for Heparin Indication: chest pain/ACS  Allergies  Allergen Reactions  . Amlodipine Swelling and Rash    Patient Measurements: Height: 5\' 5"  (165.1 cm) Weight: 188 lb 8 oz (85.5 kg) IBW/kg (Calculated) : 57 Heparin Dosing Weight: 76.7kg  Vital Signs: Temp: 97.5 F (36.4 C) (11/10 0429) Temp Source: Oral (11/10 0429) BP: 124/63 (11/10 0429) Pulse Rate: 60 (11/10 0429)  Labs: Recent Labs    10/30/18 1153 10/30/18 1456 10/30/18 1722 10/30/18 1852 10/31/18 0348  HGB 12.8  --   --  11.9* 11.3*  HCT 39.3  --   --  35.7* 33.9*  PLT 201  --   --  185 178  APTT  --   --   --   --  111*  HEPARINUNFRC  --   --   --   --  >2.20*  CREATININE 1.45*  --   --  1.45* 1.72*  TROPONINI <0.03 0.09* 0.21*  --   --     Estimated Creatinine Clearance: 29.6 mL/min (A) (by C-G formula based on SCr of 1.72 mg/dL (H)).   Medical History: Past Medical History:  Diagnosis Date  . Arthritis   . Atrial fibrillation (HCC)   . Diarrhea    since gallbladder removed  . 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 History of blood transfusion as a child   no abnormal reaction noted  . History of colon polyps   . Hyperlipidemia    takes Pravastatin daily  . Hypertension    takes Azor and Metoprolol daily  . Hypothyroidism    takes Synthroid daily  . Joint pain   . Presence of permanent cardiac pacemaker   . Urinary frequency   . Urinary urgency     Medications:  Infusions:  . heparin 1,000 Units/hr (10/31/18 0400)    Assessment: 49 yof presented to the ED with HTN. Troponin elevated and now starting IV heparin. Pt is on apixaban PTA for history of afib. Last dose of apixaban was this morning at 9am. CBC is WNL and no bleeding noted.   11/10 AM update: aPTT above goal this AM, using aPTT due to apixaban effect on heparin levels (which is elevated at >2.2). No issues per RN.   Goal of  Therapy: APTT 66-102 secs  Heparin level 0.3-0.7 units/ml Monitor platelets by anticoagulation protocol: Yes   Plan:  Dec heparin to 850 units/hr Re-check aPTT in 8 hours Daily heparin level, aPTT and CBC  Nemiah Bubar 10/31/2018,6:45 AM

## 2018-11-01 ENCOUNTER — Encounter (HOSPITAL_COMMUNITY)
Admission: EM | Disposition: A | Discharge status: Home / Self Care | Payer: Self-pay | Source: Home / Self Care | Attending: Internal Medicine

## 2018-11-01 ENCOUNTER — Encounter (HOSPITAL_COMMUNITY): Payer: Self-pay | Admitting: Internal Medicine

## 2018-11-01 DIAGNOSIS — I200 Unstable angina: Secondary | ICD-10-CM

## 2018-11-01 DIAGNOSIS — I160 Hypertensive urgency: Secondary | ICD-10-CM

## 2018-11-01 DIAGNOSIS — I2510 Atherosclerotic heart disease of native coronary artery without angina pectoris: Secondary | ICD-10-CM

## 2018-11-01 HISTORY — PX: LEFT HEART CATH AND CORONARY ANGIOGRAPHY: CATH118249

## 2018-11-01 LAB — CBC
HCT: 33.5 % — ABNORMAL LOW (ref 36.0–46.0)
HCT: 31.6 % — ABNORMAL LOW (ref 36.0–46.0)
Hemoglobin: 11.2 g/dL — ABNORMAL LOW (ref 12.0–15.0)
Hemoglobin: 10.2 g/dL — ABNORMAL LOW (ref 12.0–15.0)
MCH: 31.5 pg (ref 26.0–34.0)
MCH: 30.4 pg (ref 26.0–34.0)
MCHC: 33.4 g/dL (ref 30.0–36.0)
MCHC: 32.3 g/dL (ref 30.0–36.0)
MCV: 94.1 fL (ref 80.0–100.0)
MCV: 94 fL (ref 80.0–100.0)
NRBC: 0 % (ref 0.0–0.2)
PLATELETS: 162 10*3/uL (ref 150–400)
PLATELETS: 178 10*3/uL (ref 150–400)
RBC: 3.56 MIL/uL — ABNORMAL LOW (ref 3.87–5.11)
RBC: 3.36 MIL/uL — AB (ref 3.87–5.11)
RDW: 12.3 % (ref 11.5–15.5)
RDW: 12.4 % (ref 11.5–15.5)
WBC: 6.7 10*3/uL (ref 4.0–10.5)
WBC: 7 10*3/uL (ref 4.0–10.5)
nRBC: 0 % (ref 0.0–0.2)

## 2018-11-01 LAB — BASIC METABOLIC PANEL
Anion gap: 6 (ref 5–15)
BUN: 24 mg/dL — AB (ref 8–23)
CALCIUM: 9.2 mg/dL (ref 8.9–10.3)
CHLORIDE: 111 mmol/L (ref 98–111)
CO2: 22 mmol/L (ref 22–32)
CREATININE: 1.5 mg/dL — AB (ref 0.44–1.00)
GFR calc non Af Amer: 32 mL/min — ABNORMAL LOW (ref >60)
GFR, EST AFRICAN AMERICAN: 38 mL/min — AB (ref >60)
GLUCOSE: 101 mg/dL — AB (ref 70–99)
Potassium: 3.7 mmol/L (ref 3.5–5.1)
Sodium: 139 mmol/L (ref 135–145)

## 2018-11-01 LAB — POCT ACTIVATED CLOTTING TIME: Activated Clotting Time: 285 seconds

## 2018-11-01 LAB — HEPARIN LEVEL (UNFRACTIONATED): Heparin Unfractionated: 1.28 IU/mL — ABNORMAL HIGH (ref 0.30–0.70)

## 2018-11-01 LAB — APTT: aPTT: 88 seconds — ABNORMAL HIGH (ref 24–36)

## 2018-11-01 SURGERY — LEFT HEART CATH AND CORONARY ANGIOGRAPHY
Anesthesia: LOCAL

## 2018-11-01 MED ORDER — VERAPAMIL HCL 2.5 MG/ML IV SOLN
INTRAVENOUS | Status: AC
  Filled 2018-11-01: qty 2

## 2018-11-01 MED ORDER — LABETALOL HCL 5 MG/ML IV SOLN: 10.0000 mg | INTRAVENOUS | Status: DC | PRN

## 2018-11-01 MED ORDER — APIXABAN 5 MG PO TABS: 5.0000 mg | ORAL_TABLET | Freq: Two times a day (BID) | ORAL | Status: DC

## 2018-11-01 MED ORDER — ISOSORB DINITRATE-HYDRALAZINE 20-37.5 MG PO TABS: 1.0000 | ORAL_TABLET | Freq: Three times a day (TID) | ORAL | Status: DC

## 2018-11-01 MED ORDER — SODIUM CHLORIDE 0.9% FLUSH: 3.0000 mL | INTRAVENOUS | Status: DC | PRN

## 2018-11-01 MED ORDER — HYDRALAZINE HCL 20 MG/ML IJ SOLN: 5.0000 mg | INTRAMUSCULAR | Status: DC | PRN

## 2018-11-01 MED ORDER — SODIUM CHLORIDE 0.9% FLUSH
3.0000 mL | Freq: Two times a day (BID) | INTRAVENOUS | Status: DC
  Administered 2018-11-02 (×2): 3 mL via INTRAVENOUS

## 2018-11-01 MED ORDER — SODIUM CHLORIDE 0.9 % IV BOLUS
250.0000 mL | Freq: Once | INTRAVENOUS | Status: AC
  Administered 2018-11-01: 250 mL via INTRAVENOUS

## 2018-11-01 MED ORDER — HEPARIN (PORCINE) IN NACL 1000-0.9 UT/500ML-% IV SOLN
INTRAVENOUS | Status: AC
  Filled 2018-11-01: qty 1000

## 2018-11-01 MED ORDER — ONDANSETRON HCL 4 MG/2ML IJ SOLN
4.0000 mg | Freq: Three times a day (TID) | INTRAMUSCULAR | Status: DC | PRN
  Administered 2018-11-01: 4 mg via INTRAVENOUS
  Filled 2018-11-01: qty 2

## 2018-11-01 MED ORDER — NITROGLYCERIN 1 MG/10 ML FOR IR/CATH LAB
INTRA_ARTERIAL | Status: AC
  Filled 2018-11-01: qty 10

## 2018-11-01 MED ORDER — SODIUM CHLORIDE 0.9 % IV SOLN: INTRAVENOUS | Status: AC

## 2018-11-01 MED ORDER — MIDAZOLAM HCL 2 MG/2ML IJ SOLN
INTRAMUSCULAR | Status: AC
  Filled 2018-11-01: qty 2

## 2018-11-01 MED ORDER — SODIUM CHLORIDE 0.9 % IV SOLN: 250.0000 mL | INTRAVENOUS | Status: DC | PRN

## 2018-11-01 MED ORDER — MIDAZOLAM HCL 2 MG/2ML IJ SOLN
INTRAMUSCULAR | Status: DC | PRN
  Administered 2018-11-01: 1 mg via INTRAVENOUS

## 2018-11-01 MED ORDER — FENTANYL CITRATE (PF) 100 MCG/2ML IJ SOLN
INTRAMUSCULAR | Status: DC | PRN
  Administered 2018-11-01: 25 ug via INTRAVENOUS

## 2018-11-01 MED ORDER — HEPARIN SODIUM (PORCINE) 5000 UNIT/ML IJ SOLN: 5000.0000 [IU] | Freq: Three times a day (TID) | INTRAMUSCULAR | Status: DC

## 2018-11-01 MED ORDER — HEPARIN (PORCINE) IN NACL 1000-0.9 UT/500ML-% IV SOLN
INTRAVENOUS | Status: DC | PRN
  Administered 2018-11-01 (×2): 500 mL

## 2018-11-01 MED ORDER — NITROGLYCERIN 1 MG/10 ML FOR IR/CATH LAB
INTRA_ARTERIAL | Status: DC | PRN
  Administered 2018-11-01: 200 ug via INTRACORONARY

## 2018-11-01 MED ORDER — IOHEXOL 350 MG/ML SOLN
INTRAVENOUS | Status: DC | PRN
  Administered 2018-11-01: 70 mL via INTRA_ARTERIAL

## 2018-11-01 MED ORDER — ASPIRIN 81 MG PO CHEW
81.0000 mg | CHEWABLE_TABLET | Freq: Every day | ORAL | Status: DC
  Administered 2018-11-02 – 2018-11-03 (×2): 81 mg via ORAL
  Filled 2018-11-01 (×2): qty 1

## 2018-11-01 MED ORDER — HEPARIN SODIUM (PORCINE) 1000 UNIT/ML IJ SOLN
INTRAMUSCULAR | Status: DC | PRN
  Administered 2018-11-01 (×2): 4500 [IU] via INTRAVENOUS

## 2018-11-01 MED ORDER — FENTANYL CITRATE (PF) 100 MCG/2ML IJ SOLN
INTRAMUSCULAR | Status: AC
  Filled 2018-11-01: qty 2

## 2018-11-01 MED ORDER — CARVEDILOL 25 MG PO TABS: 25.0000 mg | ORAL_TABLET | Freq: Two times a day (BID) | ORAL | Status: DC

## 2018-11-01 MED ORDER — VERAPAMIL HCL 2.5 MG/ML IV SOLN
INTRAVENOUS | Status: DC | PRN
  Administered 2018-11-01: 10 mL via INTRA_ARTERIAL

## 2018-11-01 MED ORDER — LIDOCAINE HCL (PF) 1 % IJ SOLN
INTRAMUSCULAR | Status: DC | PRN
  Administered 2018-11-01: 2 mL

## 2018-11-01 MED ORDER — APIXABAN 5 MG PO TABS
5.0000 mg | ORAL_TABLET | Freq: Two times a day (BID) | ORAL | Status: DC
  Administered 2018-11-02 – 2018-11-03 (×3): 5 mg via ORAL
  Filled 2018-11-01 (×3): qty 1

## 2018-11-01 MED ORDER — LIDOCAINE HCL (PF) 1 % IJ SOLN
INTRAMUSCULAR | Status: AC
  Filled 2018-11-01: qty 30

## 2018-11-01 SURGICAL SUPPLY — 14 items
CATH 5FR JL3.5 JR4 ANG PIG MP (CATHETERS) ×1 IMPLANT
CATH INFINITI 5 FR 3DRC (CATHETERS) ×1 IMPLANT
CATH INFINITI 5 FR AL2 (CATHETERS) ×1 IMPLANT
CATH INFINITI 5FR AL1 (CATHETERS) ×1 IMPLANT
CATH LAUNCHER 6FR EBU3.5 (CATHETERS) ×1 IMPLANT
DEVICE RAD COMP TR BAND LRG (VASCULAR PRODUCTS) ×2 IMPLANT
GLIDESHEATH SLEND SS 6F .021 (SHEATH) ×1 IMPLANT
GUIDEWIRE INQWIRE 1.5J.035X260 (WIRE) IMPLANT
GUIDEWIRE PRESSURE COMET II (WIRE) ×1 IMPLANT
INQWIRE 1.5J .035X260CM (WIRE) ×2
KIT HEART LEFT (KITS) ×2 IMPLANT
PACK CARDIAC CATHETERIZATION (CUSTOM PROCEDURE TRAY) ×2 IMPLANT
TRANSDUCER W/STOPCOCK (MISCELLANEOUS) ×2 IMPLANT
TUBING CIL FLEX 10 FLL-RA (TUBING) ×2 IMPLANT

## 2018-11-01 NOTE — Progress Notes (Signed)
[facility] Post-Cath Note  Date: 11/01/18 Time: 4:36 PM  Subjective:  I was alerted by cath lab team that bleeding had occurred at radial arteriotomy site this afternoon.  Patient currently feels nauseated and also has a HA.  She denies chest pain and shortness of breath.  Bleeding ceased after TR band replaced by cath lab staff.  Objective: Temp:  [97.9 F (36.6 C)-98.6 F (37 C)] 98.6 F (37 C) (11/11 1543) Pulse Rate:  [59-73] 65 (11/11 1543) Resp:  [6-19] 15 (11/11 1206) BP: (85-187)/(52-104) 102/57 (11/11 1543) SpO2:  [96 %-100 %] 97 % (11/11 1543) Weight:  [85.5 kg] 85.5 kg (11/11 0626) Gen: Pale, somnolent-appearing woman. Heart: RRR w/o murmurs. Ext: Right forearm with scant bruising.  No active bleeding.  TR band still in place.  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  Post-cath, she had bleeding from her TR band site and transient hypotension that resolved with IVF.  I suspect the hypotension was due to a vasovagal event.  I agree with continued fluid resuscitation.  Apixaban should not be restarted until site has been reassessed by the rounding team tomorrow.  Yvonne Kendall, MD [facility] Pager: [phone]

## 2018-11-01 NOTE — Brief Op Note (Signed)
BRIEF CARDIAC CATHETERIZATION NOTE  DATE: 11/01/2018 TIME: 12:15 PM  PATIENT:  Lauren Henry  77 y.o. female  PRE-OPERATIVE DIAGNOSIS:  Unstable angina  POST-OPERATIVE DIAGNOSIS:  Same  PROCEDURE:  Procedure(s): LEFT HEART CATH AND CORONARY ANGIOGRAPHY (N/A)  SURGEON:  Surgeon(s) and Role:    * Nakari Bracknell, MD - Primary  FINDINGS: 1. Mild to moderate, non-obstructive CAD.  Most severe lesion is a 60% stenosis in large OM1 branch that is not hemodynamically significant (DFR 1.0). 2. Normal left ventricular filling pressure. 3. Systemic hypertension.  RECOMMENDATIONS: 1. Medical therapy and risk factor modification to prevent progression of disease. 2. Consider noninvasive evaluation for renal artery stenosis (if not already performed as an outpatient) given hypertension and renal insufficiency.  Lauren Kendall, MD [facility] Pager: [phone]

## 2018-11-01 NOTE — Progress Notes (Signed)
PROGRESS NOTE    Lauren Henry  ZOX:[id number] DOB: 1941/09/19 DOA: 10/30/2018 PCP: Gaspar Garbe, MD  Outpatient Specialists:   Brief Narrative:  Patient is a 77 year old female with past medical history significant for hypothyroidism, status post pacemaker placement for heart block, hyperlipidemia, atrial fibrillation on Eliquis and joint pains.  Patient was admitted with elevated troponin, vague chest pain and uncontrolled blood pressure.  Recent history of abnormal cardiac stress test.  Cardiology input is appreciated.  Patient underwent cardiac catheterization without significant coronary artery disease noted.    11/01/2018: Cardiac cath did not reveal significant coronary artery disease.  Blood pressure control is improving.  Will check renal panel in the morning.  Likely DC home in a.m.  Assessment & Plan:   Active Problems:   Chest pain  Hypertensive urgency: Cautiously control patient's blood pressure, and avoid excessive drop. Monitor closely.  NSTEMI: Cardiac enzymes were initially elevated.  Aspirin. Heparin. Cardiology input is appreciated, patient underwent cardiac catheterization.    Chronic kidney disease stage III: Continue to monitor.   Patient was exposed to die a couple of days.   Monitor renal function closely post cardiac catheterization.  Atrial fibrillation, chronic, on Eliquis prior to presentation: -Continue beta-blocker and Eliquis.  Hyperlipidemia: Fasting lipid profile  DVT prophylaxis: Patient was on Eliquis prior to presentation.  Patient be started on heparin drip. Code Status: Full code Family Communication: Husband Disposition Plan: Home eventually  Consultants:   Cardiology  Procedures:   Cardiac cath  Antimicrobials:   None   Subjective: No chest pain. Blood pressure control is improving.  Objective: Vitals:   11/01/18 1657 11/01/18 1711 11/01/18 1726 11/01/18 1931  BP: 128/65 (!) 127/59 (!) 137/59 (!) 129/57    Pulse: (!) 59 60 61   Resp:      Temp:    98.6 F (37 C)  TempSrc:    Oral  SpO2: 96% 96% 98%   Weight:      Height:        Intake/Output Summary (Last 24 hours) at 11/01/2018 2220 Last data filed at 11/01/2018 1744 Gross per 24 hour  Intake 2377.35 ml  Output -  Net 2377.35 ml   Filed Weights   10/30/18 1735 10/31/18 0429 11/01/18 0626  Weight: 85.8 kg 85.5 kg 85.5 kg    Examination:  General exam: Appears calm and comfortable  Respiratory system: Clear to auscultation. Respiratory effort normal. Cardiovascular system: S1 & S2 heard.  No pedal edema. Gastrointestinal system: Abdomen is nondistended, soft and nontender. No organomegaly or masses felt. Normal bowel sounds heard. Central nervous system: Alert and oriented. No focal neurological deficits. Extremities: Symmetric 5 x 5 power.   Data Reviewed: I have personally reviewed following labs and imaging studies  CBC: Recent Labs  Lab 10/30/18 1153 10/30/18 1852 10/31/18 0348 11/01/18 0510 11/01/18 1547  WBC 8.1 6.9 6.6 6.7 7.0  NEUTROABS 5.9  --   --   --   --   HGB 12.8 11.9* 11.3* 11.2* 10.2*  HCT 39.3 35.7* 33.9* 33.5* 31.6*  MCV 95.6 93.5 94.4 94.1 94.0  PLT 201 185 178 162 178   Basic Metabolic Panel: Recent Labs  Lab 10/30/18 1153 10/30/18 1852 10/31/18 0348 11/01/18 0510  NA 140  --  139 139  K 3.9  --  3.8 3.7  CL 106  --  106 111  CO2 25  --  24 22  GLUCOSE 106*  --  95 101*  BUN 21  --  26* 24*  CREATININE 1.45* 1.45* 1.72* 1.50*  CALCIUM 10.3  --  9.9 9.2  MG  --  1.8  --   --    GFR: Estimated Creatinine Clearance: 33.9 mL/min (A) (by C-G formula based on SCr of 1.5 mg/dL (H)). Liver Function Tests: Recent Labs  Lab 10/31/18 0348  AST 20  ALT 17  ALKPHOS 44  BILITOT 0.6  PROT 6.6  ALBUMIN 3.6   No results for input(s): LIPASE, AMYLASE in the last 168 hours. No results for input(s): AMMONIA in the last 168 hours. Coagulation Profile: No results for input(s): INR,  PROTIME in the last 168 hours. Cardiac Enzymes: Recent Labs  Lab 10/30/18 1456 10/30/18 1722 10/31/18 0727 10/31/18 1302 10/31/18 1856  TROPONINI 0.09* 0.21* 0.09* 0.07* 0.04*   BNP (last 3 results) No results for input(s): PROBNP in the last 8760 hours. HbA1C: No results for input(s): HGBA1C in the last 72 hours. CBG: No results for input(s): GLUCAP in the last 168 hours. Lipid Profile: Recent Labs    10/30/18 1852  CHOL 122  HDL 52  LDLCALC 60  TRIG 51  CHOLHDL 2.3   Thyroid Function Tests: Recent Labs    10/30/18 1852  TSH 4.554*   Anemia Panel: No results for input(s): VITAMINB12, FOLATE, FERRITIN, TIBC, IRON, RETICCTPCT in the last 72 hours. Urine analysis:    Component Value Date/Time   COLORURINE YELLOW 10/26/2015 0957   APPEARANCEUR CLOUDY (A) 10/26/2015 0957   LABSPEC 1.013 10/26/2015 0957   PHURINE 5.5 10/26/2015 0957   GLUCOSEU NEGATIVE 10/26/2015 0957   HGBUR NEGATIVE 10/26/2015 0957   BILIRUBINUR NEGATIVE 10/26/2015 0957   KETONESUR NEGATIVE 10/26/2015 0957   PROTEINUR NEGATIVE 10/26/2015 0957   UROBILINOGEN 0.2 10/26/2015 0957   NITRITE NEGATIVE 10/26/2015 0957   LEUKOCYTESUR TRACE (A) 10/26/2015 0957   Sepsis Labs: @LABRCNTIP (procalcitonin:4,lacticidven:4)  )No results found for this or any previous visit (from the past 240 hour(s)).       Radiology Studies: No results found.      Scheduled Meds: . apixaban  5 mg Oral BID  . [START ON 11/02/2018] aspirin  81 mg Oral Daily  . cholecalciferol  5,000 Units Oral Daily  . levothyroxine  150 mcg Oral QAC breakfast  . multivitamin with minerals  1 tablet Oral Daily  . rosuvastatin  40 mg Oral Daily  . sodium chloride flush  3 mL Intravenous Q12H   Continuous Infusions: . sodium chloride       LOS: 1 day    Time spent: 25 minutes.    Berton Mount, MD  [facility] Pager #: [phone] 7PM-7AM contact night coverage as above

## 2018-11-01 NOTE — Progress Notes (Signed)
RN notified to come assess the patient. When RN came to assess, there was a moderate amount of blood from TR band on the patient's gown, sheets, and floor. RN held pressure and called cath lab. Cath lab staff came and assessed and applied new TR band. Blood pressure decreased to low 100s. Last one was 102/57. Other vitals stable. Notified cardiology NP on call. Orders to get stat CBC and D/C eliquis at this time. Family called RN to assess patient again after a few moments. Patient BP dropped to 55/37. Patient stated that she was feeling sick and dizzy. Notified cards NP. Orders for 250 bolus Will continue to monitor.

## 2018-11-01 NOTE — Progress Notes (Signed)
 Progress Note  Patient Name: Lauren Henry Date of Encounter: 11/01/2018  Primary Cardiologist: Tiffany [place], MD  EP:  GT  Subjective   No chest pain,  No SOB    Inpatient Medications    Scheduled Meds: . aspirin EC  81 mg Oral Daily  . carvedilol  40 mg Oral Daily  . cholecalciferol  5,000 Units Oral Daily  . levothyroxine  150 mcg Oral QAC breakfast  . multivitamin with minerals  1 tablet Oral Daily  . rosuvastatin  40 mg Oral Daily  . sodium chloride flush  3 mL Intravenous Q12H   Continuous Infusions: . sodium chloride    . sodium chloride 75 mL/hr at 10/31/18 2000  . heparin 750 Units/hr (10/31/18 2000)   PRN Meds: sodium chloride, sodium chloride flush, zolpidem   Vital Signs    Vitals:   10/31/18 1623 10/31/18 2029 11/01/18 0606 11/01/18 0626  BP: 137/70 (!) 146/76 (!) 168/72   Pulse:  60 65   Resp:      Temp:  97.9 F (36.6 C) 98.3 F (36.8 C)   TempSrc:  Oral Oral   SpO2:  98% 98%   Weight:    85.5 kg  Height:        Intake/Output Summary (Last 24 hours) at 11/01/2018 0736 Last data filed at 10/31/2018 2000 Gross per 24 hour  Intake 2264.25 ml  Output 450 ml  Net 1814.25 ml   Filed Weights   10/30/18 1735 10/31/18 0429 11/01/18 0626  Weight: 85.8 kg 85.5 kg 85.5 kg    Telemetry    AV pacing  - Personally Reviewed  ECG    None today - Personally Reviewed  Physical Exam   GEN: No acute distress.   Neck: No JVD Cardiac: RRR, no murmurs, rubs, or gallops.  Respiratory: Clear to auscultation bilaterally. GI: Soft, nontender, non-distended  MS: No edema; No deformity. Neuro:  Nonfocal  Psych: Normal affect   Labs    Chemistry Recent Labs  Lab 10/30/18 1153 10/30/18 1852 10/31/18 0348 11/01/18 0510  NA 140  --  139 139  K 3.9  --  3.8 3.7  CL 106  --  106 111  CO2 25  --  24 22  GLUCOSE 106*  --  95 101*  BUN 21  --  26* 24*  CREATININE 1.45* 1.45* 1.72* 1.50*  CALCIUM 10.3  --  9.9 9.2  PROT  --   --  6.6  --     ALBUMIN  --   --  3.6  --   AST  --   --  20  --   ALT  --   --  17  --   ALKPHOS  --   --  44  --   BILITOT  --   --  0.6  --   GFRNONAA 34* 34* 27* 32*  GFRAA 39* 39* 32* 38*  ANIONGAP 9  --  9 6     Hematology Recent Labs  Lab 10/30/18 1852 10/31/18 0348 11/01/18 0510  WBC 6.9 6.6 6.7  RBC 3.82* 3.59* 3.56*  HGB 11.9* 11.3* 11.2*  HCT 35.7* 33.9* 33.5*  MCV 93.5 94.4 94.1  MCH 31.2 31.5 31.5  MCHC 33.3 33.3 33.4  RDW 12.3 12.4 12.3  PLT 185 178 162    Cardiac Enzymes Recent Labs  Lab 10/30/18 1722 10/31/18 0727 10/31/18 1302 10/31/18 1856  TROPONINI 0.21* 0.09* 0.07* 0.04*   No results for input(s): TROPIPOC in   the last 168 hours.   BNPNo results for input(s): BNP, PROBNP in the last 168 hours.   DDimer No results for input(s): DDIMER in the last 168 hours.   Radiology    Dg Chest 2 View  Result Date: 10/30/2018 CLINICAL DATA:  Pt arrived via [facility]. 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 Pt reports that she did have some chest pain this morning for a few seconds, non radiating. Pt states that her clonidine dose was increased from 2x to 3x per day three weeks ago. Initial blood pressure for EMS was 130/100. Hx of HTN, A-fib, dysrhythmia. EXAM: CHEST - 2 VIEW COMPARISON:  11/02/2014 FINDINGS: Cardiac silhouette is normal in size. Stable left anterior chest wall sequential pacemaker. No mediastinal or hilar masses. No evidence of adenopathy. There are prominent bronchovascular markings diffusely, stable. No evidence of pneumonia or pulmonary edema. No pleural effusion or pneumothorax. Skeletal structures are demineralized but intact. IMPRESSION: No active cardiopulmonary disease. Electronically Signed   By: David  Ormond M.D.   On: 10/30/2018 13:11   Ct Head Wo Contrast  Result Date: 10/30/2018 CLINICAL DATA:  Hypertension, suspected subarachnoid hemorrhage clinically. EXAM: CT HEAD WITHOUT CONTRAST TECHNIQUE:  Contiguous axial images were obtained from the base of the skull through the vertex without intravenous contrast. COMPARISON:  None. FINDINGS: Brain: The brainstem, cerebellum, cerebral peduncles, thalami, basal ganglia, basilar cisterns, and ventricular system appear within normal limits. No intracranial hemorrhage, mass lesion, or acute CVA. Vascular: There is atherosclerotic calcification of the cavernous carotid arteries bilaterally. Skull: Unremarkable Sinuses/Orbits: Chronic right maxillary sinusitis. Other: No supplemental non-categorized findings. IMPRESSION: 1. No acute intracranial findings. 2. There is atherosclerotic calcification of the cavernous carotid arteries bilaterally. 3. Chronic right maxillary sinusitis. Electronically Signed   By: Walter  Liebkemann M.D.   On: 10/30/2018 13:44    Cardiac Studies   Echo 11/01/18 Study Conclusions  - Left ventricle: The cavity size was normal. Wall thickness was   increased in a pattern of mild LVH. Systolic function was normal.   The estimated ejection fraction was in the range of 55% to 60%.   Wall motion was normal; there were no regional wall motion   abnormalities. Doppler parameters are consistent with abnormal   left ventricular relaxation (grade 1 diastolic dysfunction).   Doppler parameters are consistent with indeterminate ventricular   filling pressure. - Aortic valve: Moderately calcified annulus. Trileaflet; mildly   thickened leaflets. - Mitral valve: Moderately calcified annulus. - Atrial septum: No defect or patent foramen ovale was identified. - Tricuspid valve: There was mild regurgitation.   Nuclear stress test 10/07/2018 Large size, moderate severity partially reversible inferior, apical and inferolateral perfusion defect, suggestive of ischemia. This could also represent pacemaker-related artifact. There is apical akinesis and inferoapical dyskinesis. LVEF 54%. This is an intermediate risk study. Clinical  correlation is advised.  Echocardiogram 09/13/2018 - Left ventricle: The cavity size was normal. There was mild focal basal hypertrophy of the septum. Systolic function was mildly reduced. The estimated ejection fraction was in the range of 45% to 50%. Diffuse hypokinesis. Doppler parameters are consistent with both elevated ventricular end-diastolic filling pressure and elevated left atrial filling pressure. - Mitral valve: Severely calcified annulus. Mildly thickened, mildly calcified leaflets . - Left atrium: The atrium was mildly dilated. - Atrial septum: No defect or patent foramen ovale was identified.   Patient Profile     77 y.o. female with a hx of CHB, s/p pacemaker,   difficult to control hypertension, chronic kidney disease stage 3, paroxysmal atrial fibrillation on long term anticoagulation with Apixaban and presumed CAD with + stress test 10/07/18, now admitted for NSTEMI with significant HTN.    Assessment & Plan    NSTEMI - with abnormal nuc study with medical therapy related to CKD.  She was having no angina prior to this admit,  troponins pk 0.21, possible cath today.    --on IV heparin  CKD- she has had hydration NS at 75/hr.   cr. Today 1.50 will check with Dr. Turner to proceed with cath  - yes --avapro and spironolactone on hold   HTN --hydralazine could be added added for BP control.  --yesterday developed hypotension to 60s systolic after clonidine and hydralazine.   S.p pacer -V paced.   PAF on Eliquis on hold. Last dose on 10/29/18 on IV heparin. Now with AV pacing.           For questions or updates, please contact [facility] Please consult [email or web address] for contact info under        Signed,  , NP  11/01/2018, 7:36 AM    

## 2018-11-01 NOTE — Progress Notes (Signed)
Removed TR Band from right radial. Site is a level 1. Applied gauze and Tegaderm. Educated patient to leave in place for 24 hrs. Pt verbalized understanding. Will continue to monitor

## 2018-11-01 NOTE — Progress Notes (Signed)
Oozing at cath site will hold eliquis until after rounds tomorrow

## 2018-11-01 NOTE — H&P (View-Only) (Signed)
Progress Note  Patient Name: Lauren Henry Date of Encounter: 11/01/2018  Primary Cardiologist: Chilton Si, MD  EP:  GT  Subjective   No chest pain,  No SOB    Inpatient Medications    Scheduled Meds: . aspirin EC  81 mg Oral Daily  . carvedilol  40 mg Oral Daily  . cholecalciferol  5,000 Units Oral Daily  . levothyroxine  150 mcg Oral QAC breakfast  . multivitamin with minerals  1 tablet Oral Daily  . rosuvastatin  40 mg Oral Daily  . sodium chloride flush  3 mL Intravenous Q12H   Continuous Infusions: . sodium chloride    . sodium chloride 75 mL/hr at 10/31/18 2000  . heparin 750 Units/hr (10/31/18 2000)   PRN Meds: sodium chloride, sodium chloride flush, zolpidem   Vital Signs    Vitals:   10/31/18 1623 10/31/18 2029 11/01/18 0606 11/01/18 0626  BP: 137/70 (!) 146/76 (!) 168/72   Pulse:  60 65   Resp:      Temp:  97.9 F (36.6 C) 98.3 F (36.8 C)   TempSrc:  Oral Oral   SpO2:  98% 98%   Weight:    85.5 kg  Height:        Intake/Output Summary (Last 24 hours) at 11/01/2018 0736 Last data filed at 10/31/2018 2000 Gross per 24 hour  Intake 2264.25 ml  Output 450 ml  Net 1814.25 ml   Filed Weights   10/30/18 1735 10/31/18 0429 11/01/18 0626  Weight: 85.8 kg 85.5 kg 85.5 kg    Telemetry    AV pacing  - Personally Reviewed  ECG    None today - Personally Reviewed  Physical Exam   GEN: No acute distress.   Neck: No JVD Cardiac: RRR, no murmurs, rubs, or gallops.  Respiratory: Clear to auscultation bilaterally. GI: Soft, nontender, non-distended  MS: No edema; No deformity. Neuro:  Nonfocal  Psych: Normal affect   Labs    Chemistry Recent Labs  Lab 10/30/18 1153 10/30/18 1852 10/31/18 0348 11/01/18 0510  NA 140  --  139 139  K 3.9  --  3.8 3.7  CL 106  --  106 111  CO2 25  --  24 22  GLUCOSE 106*  --  95 101*  BUN 21  --  26* 24*  CREATININE 1.45* 1.45* 1.72* 1.50*  CALCIUM 10.3  --  9.9 9.2  PROT  --   --  6.6  --     ALBUMIN  --   --  3.6  --   AST  --   --  20  --   ALT  --   --  17  --   ALKPHOS  --   --  44  --   BILITOT  --   --  0.6  --   GFRNONAA 34* 34* 27* 32*  GFRAA 39* 39* 32* 38*  ANIONGAP 9  --  9 6     Hematology Recent Labs  Lab 10/30/18 1852 10/31/18 0348 11/01/18 0510  WBC 6.9 6.6 6.7  RBC 3.82* 3.59* 3.56*  HGB 11.9* 11.3* 11.2*  HCT 35.7* 33.9* 33.5*  MCV 93.5 94.4 94.1  MCH 31.2 31.5 31.5  MCHC 33.3 33.3 33.4  RDW 12.3 12.4 12.3  PLT 185 178 162    Cardiac Enzymes Recent Labs  Lab 10/30/18 1722 10/31/18 0727 10/31/18 1302 10/31/18 1856  TROPONINI 0.21* 0.09* 0.07* 0.04*   No results for input(s): TROPIPOC in  the last 168 hours.   BNPNo results for input(s): BNP, PROBNP in the last 168 hours.   DDimer No results for input(s): DDIMER in the last 168 hours.   Radiology    Dg Chest 2 View  Result Date: 10/30/2018 CLINICAL DATA:  Pt arrived via [facility]. 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 Pt reports that she did have some chest pain this morning for a few seconds, non radiating. Pt states that her clonidine dose was increased from 2x to 3x per day three weeks ago. Initial blood pressure for EMS was 130/100. Hx of HTN, A-fib, dysrhythmia. EXAM: CHEST - 2 VIEW COMPARISON:  11/02/2014 FINDINGS: Cardiac silhouette is normal in size. Stable left anterior chest wall sequential pacemaker. No mediastinal or hilar masses. No evidence of adenopathy. There are prominent bronchovascular markings diffusely, stable. No evidence of pneumonia or pulmonary edema. No pleural effusion or pneumothorax. Skeletal structures are demineralized but intact. IMPRESSION: No active cardiopulmonary disease. Electronically Signed   By: Amie Portland M.D.   On: 10/30/2018 13:11   Ct Head Wo Contrast  Result Date: 10/30/2018 CLINICAL DATA:  Hypertension, suspected subarachnoid hemorrhage clinically. EXAM: CT HEAD WITHOUT CONTRAST TECHNIQUE:  Contiguous axial images were obtained from the base of the skull through the vertex without intravenous contrast. COMPARISON:  None. FINDINGS: Brain: The brainstem, cerebellum, cerebral peduncles, thalami, basal ganglia, basilar cisterns, and ventricular system appear within normal limits. No intracranial hemorrhage, mass lesion, or acute CVA. Vascular: There is atherosclerotic calcification of the cavernous carotid arteries bilaterally. Skull: Unremarkable Sinuses/Orbits: Chronic right maxillary sinusitis. Other: No supplemental non-categorized findings. IMPRESSION: 1. No acute intracranial findings. 2. There is atherosclerotic calcification of the cavernous carotid arteries bilaterally. 3. Chronic right maxillary sinusitis. Electronically Signed   By: Gaylyn Rong M.D.   On: 10/30/2018 13:44    Cardiac Studies   Echo 11/01/18 Study Conclusions  - Left ventricle: The cavity size was normal. Wall thickness was   increased in a pattern of mild LVH. Systolic function was normal.   The estimated ejection fraction was in the range of 55% to 60%.   Wall motion was normal; there were no regional wall motion   abnormalities. Doppler parameters are consistent with abnormal   left ventricular relaxation (grade 1 diastolic dysfunction).   Doppler parameters are consistent with indeterminate ventricular   filling pressure. - Aortic valve: Moderately calcified annulus. Trileaflet; mildly   thickened leaflets. - Mitral valve: Moderately calcified annulus. - Atrial septum: No defect or patent foramen ovale was identified. - Tricuspid valve: There was mild regurgitation.   Nuclear stress test 10/07/2018 Large size, moderate severity partially reversible inferior, apical and inferolateral perfusion defect, suggestive of ischemia. This could also represent pacemaker-related artifact. There is apical akinesis and inferoapical dyskinesis. LVEF 54%. This is an intermediate risk study. Clinical  correlation is advised.  Echocardiogram 09/13/2018 - Left ventricle: The cavity size was normal. There was mild focal basal hypertrophy of the septum. Systolic function was mildly reduced. The estimated ejection fraction was in the range of 45% to 50%. Diffuse hypokinesis. Doppler parameters are consistent with both elevated ventricular end-diastolic filling pressure and elevated left atrial filling pressure. - Mitral valve: Severely calcified annulus. Mildly thickened, mildly calcified leaflets . - Left atrium: The atrium was mildly dilated. - Atrial septum: No defect or patent foramen ovale was identified.   Patient Profile     77 y.o. female with a hx of CHB, s/p pacemaker,  difficult to control hypertension, chronic kidney disease stage 3, paroxysmal atrial fibrillation on long term anticoagulation with Apixaban and presumed CAD with + stress test 10/07/18, now admitted for NSTEMI with significant HTN.    Assessment & Plan    NSTEMI - with abnormal nuc study with medical therapy related to CKD.  She was having no angina prior to this admit,  troponins pk 0.21, possible cath today.    --on IV heparin  CKD- she has had hydration NS at 75/hr.   cr. Today 1.50 will check with Dr. Mayford Knife to proceed with cath  - yes --avapro and spironolactone on hold   HTN --hydralazine could be added added for BP control.  --yesterday developed hypotension to 60s systolic after clonidine and hydralazine.   S.p pacer -V paced.   PAF on Eliquis on hold. Last dose on 10/29/18 on IV heparin. Now with AV pacing.           For questions or updates, please contact [facility] Please consult [email or web address] for contact info under        Signed, Nada Boozer, NP  11/01/2018, 7:36 AM

## 2018-11-01 NOTE — Interval H&P Note (Signed)
History and Physical Interval Note:  11/01/2018 11:12 AM  [name]  has presented today for cardiac catheterization, with the diagnosis of NSTEMI. The various methods of treatment have been discussed with the patient and family. After consideration of risks, benefits and other options for treatment, the patient has consented to  Procedure(s): LEFT HEART CATH AND CORONARY ANGIOGRAPHY (N/A) as a surgical intervention .  The patient's history has been reviewed, patient examined, no change in status, stable for surgery.  I have reviewed the patient's chart and labs.  Questions were answered to the patient's satisfaction.    Cath Lab Visit (complete for each Cath Lab visit)  Clinical Evaluation Leading to the Procedure:   ACS: Yes.    Non-ACS:  N/A  Lauren Henry

## 2018-11-01 NOTE — Progress Notes (Signed)
6E called Cath Lab main desk asking for assistance with a TR band. They reported the had issues with bleeding after TR band deflation and wanted verification that the placement of the band was still appropriate. Upon arrival to patient's bedside, this RN noted that the patient's gown and bed sheets were stained with a large amount of fresh blood. There were sheets and towels in a pile on the floor beside the patient's bed that had fresh blood stains. Upon inspection, the TR band placement was accurate and the patient was not actively bleeding. The bedside RN stated that after two deflations of 3 mL of air each the patient called her because she was bleeding. At that time the bedside RN stated she inflated the band back to it's original placement amount of 14 mL. With assistance from a second RN from the Cath Lab a new TR band was appropriately placed after manual blood pressure cuff was inflated on upper arm of patient to impede arterial flow and 15 mL of air was placed in band. This RN reeducated patient on TR band instructions and when to call for help. This RN notified Dr. Okey Dupre of situation. No further orders received at this time.

## 2018-11-01 NOTE — Progress Notes (Signed)
ANTICOAGULATION CONSULT NOTE   Pharmacy Consult for Heparin Indication: chest pain/ACS  Allergies  Allergen Reactions  . Amlodipine Swelling and Rash    Patient Measurements: Height: 5\' 5"  (165.1 cm) Weight: 188 lb 7.9 oz (85.5 kg) IBW/kg (Calculated) : 57 Heparin Dosing Weight: 76.7kg  Vital Signs: Temp: 98.3 F (36.8 C) (11/11 0606) Temp Source: Oral (11/11 0606) BP: 168/72 (11/11 0606) Pulse Rate: 65 (11/11 0606)  Labs: Recent Labs    10/30/18 1852 10/31/18 0348 10/31/18 0727 10/31/18 1302 10/31/18 1446 10/31/18 1856 11/01/18 0510  HGB 11.9* 11.3*  --   --   --   --  11.2*  HCT 35.7* 33.9*  --   --   --   --  33.5*  PLT 185 178  --   --   --   --  162  APTT  --  111*  --   --  111*  --  88*  HEPARINUNFRC  --  >2.20*  --   --   --   --  1.28*  CREATININE 1.45* 1.72*  --   --   --   --  1.50*  TROPONINI  --   --  0.09* 0.07*  --  0.04*  --     Estimated Creatinine Clearance: 33.9 mL/min (A) (by C-G formula based on SCr of 1.5 mg/dL (H)).   Medical History: Past Medical History:  Diagnosis Date  . Arthritis   . Atrial fibrillation (HCC)   . Diarrhea    since gallbladder removed  . 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 History of blood transfusion as a child   no abnormal reaction noted  . History of colon polyps   . Hyperlipidemia    takes Pravastatin daily  . Hypertension    takes Azor and Metoprolol daily  . Hypothyroidism    takes Synthroid daily  . Joint pain   . Presence of permanent cardiac pacemaker   . Urinary frequency   . Urinary urgency     Medications:  Infusions:  . sodium chloride    . sodium chloride 75 mL/hr at 10/31/18 2000  . heparin 750 Units/hr (10/31/18 2000)    Assessment: 36 yof presented to the ED with HTN. Troponin elevated peak at 0.2 now trending down   Pt is on apixaban PTA for history of afib. Last dose of apixaban was this morning at 9am. CBC is WNL and no  bleeding noted.   Heparin drip 750 uts/hr HL falsely elevated 1.28 from apixaban - slow to clear with AKI but aptt 88sec at goal  Plan for cath lab today   Goal of Therapy: APTT 66-102 secs  Heparin level 0.3-0.7 units/ml Monitor platelets by anticoagulation protocol: Yes   Plan:  continue heparin  750 units/hr Daily HL, APtt CBC  F/u after cath   Leota Sauers Pharm.D. CPP, BCPS Clinical Pharmacist [phone] 11/01/2018 8:13 AM

## 2018-11-02 ENCOUNTER — Ambulatory Visit: Payer: Medicare Other

## 2018-11-02 ENCOUNTER — Inpatient Hospital Stay (HOSPITAL_COMMUNITY): Payer: Medicare Other

## 2018-11-02 ENCOUNTER — Other Ambulatory Visit (HOSPITAL_COMMUNITY): Payer: Medicare Other

## 2018-11-02 DIAGNOSIS — R7989 Other specified abnormal findings of blood chemistry: Secondary | ICD-10-CM

## 2018-11-02 LAB — BASIC METABOLIC PANEL
ANION GAP: 5 (ref 5–15)
BUN: 20 mg/dL (ref 8–23)
CALCIUM: 9 mg/dL (ref 8.9–10.3)
CO2: 24 mmol/L (ref 22–32)
CREATININE: 1.6 mg/dL — AB (ref 0.44–1.00)
Chloride: 110 mmol/L (ref 98–111)
GFR calc Af Amer: 35 mL/min — ABNORMAL LOW (ref >60)
GFR, EST NON AFRICAN AMERICAN: 30 mL/min — AB (ref >60)
GLUCOSE: 113 mg/dL — AB (ref 70–99)
Potassium: 3.7 mmol/L (ref 3.5–5.1)
Sodium: 139 mmol/L (ref 135–145)

## 2018-11-02 LAB — CBC
HCT: 28.3 % — ABNORMAL LOW (ref 36.0–46.0)
Hemoglobin: 9.3 g/dL — ABNORMAL LOW (ref 12.0–15.0)
MCH: 31.5 pg (ref 26.0–34.0)
MCHC: 32.9 g/dL (ref 30.0–36.0)
MCV: 95.9 fL (ref 80.0–100.0)
NRBC: 0 % (ref 0.0–0.2)
PLATELETS: 141 10*3/uL — AB (ref 150–400)
RBC: 2.95 MIL/uL — ABNORMAL LOW (ref 3.87–5.11)
RDW: 12.5 % (ref 11.5–15.5)
WBC: 7.3 10*3/uL (ref 4.0–10.5)

## 2018-11-02 MED ORDER — CLONIDINE HCL 0.1 MG PO TABS
0.1000 mg | ORAL_TABLET | Freq: Once | ORAL | Status: AC
  Administered 2018-11-02: 0.1 mg via ORAL
  Filled 2018-11-02: qty 1

## 2018-11-02 MED ORDER — HYDRALAZINE HCL 20 MG/ML IJ SOLN
5.0000 mg | Freq: Once | INTRAMUSCULAR | Status: AC
  Administered 2018-11-02: 5 mg via INTRAVENOUS
  Filled 2018-11-02: qty 1

## 2018-11-02 MED ORDER — IRBESARTAN 150 MG PO TABS
300.0000 mg | ORAL_TABLET | Freq: Every day | ORAL | Status: DC
  Administered 2018-11-02 – 2018-11-03 (×2): 300 mg via ORAL
  Filled 2018-11-02 (×2): qty 2

## 2018-11-02 NOTE — Progress Notes (Signed)
CARDIAC REHAB PHASE I   PRE:  Rate/Rhythm: AV paced 60  BP:  Supine: 119/66  Sitting: 117/62  Standing: `124/60   SaO2: 97%RA  MODE:  Ambulation: 550 ft   POST:  Rate/Rhythm: 82 AV paced   BP:  Supine:   Sitting: 139/59  Standing:    SaO2: 97%RA 0930-1035 Pt walked 550 ft on RA with hand held asst with steady gait. No dizziness or CP. BPs as documented above. MI education completed with pt and husband who voiced understanding. Reviewed NTG use, MI restrictions, heart healthy diet and low sodium diet, ex ed and CRP 2. [facility]. Encouraged to continue monitoring BP at home.   Luetta Nuttingharlene Zafar Debrosse, RN BSN  11/02/2018 10:30 AM

## 2018-11-02 NOTE — Progress Notes (Signed)
PT Cancellation Note  Patient Details Name: Lauren Henry MRN: [id number] DOB: 08/31/1941   Cancelled Treatment:    Reason Eval/Treat Not Completed: Other (comment) attempted to work with patient, she was currently eating lunch. Will attempt to return as/if time and schedule allow.    Nedra HaiKristen  PT, DPT, CBIS  Supplemental Physical Therapist [facility]    Pager [phone] Acute Rehab Office [phone]

## 2018-11-02 NOTE — Discharge Instructions (Signed)

## 2018-11-02 NOTE — Progress Notes (Signed)
Physical therapy evaluation complete with PT recommendation for cardiac rehab, no assistive device or further PT recommended as patient appears to be at her general baseline for mobility. Further documentation to follow later.   Nedra Hai PT, DPT, CBIS  Supplemental Physical Therapist [facility]    Pager [phone] Acute Rehab Office [phone]

## 2018-11-02 NOTE — Progress Notes (Signed)
Progress Note  Patient Name: Lauren Henry Date of Encounter: 11/02/2018  Primary Cardiologist: Chilton Si, MD   Subjective   No chest pain, no SOB. Feels better today.   She did bleed a lot last pm.   Inpatient Medications    Scheduled Meds: . apixaban  5 mg Oral BID  . aspirin  81 mg Oral Daily  . cholecalciferol  5,000 Units Oral Daily  . levothyroxine  150 mcg Oral QAC breakfast  . multivitamin with minerals  1 tablet Oral Daily  . rosuvastatin  40 mg Oral Daily  . sodium chloride flush  3 mL Intravenous Q12H   Continuous Infusions: . sodium chloride     PRN Meds: sodium chloride, ondansetron (ZOFRAN) IV, sodium chloride flush, zolpidem   Vital Signs    Vitals:   11/01/18 1711 11/01/18 1726 11/01/18 1931 11/02/18 0414  BP: (!) 127/59 (!) 137/59 (!) 129/57 123/79  Pulse: 60 61  61  Resp:      Temp:   98.6 F (37 C) 98.6 F (37 C)  TempSrc:   Oral Oral  SpO2: 96% 98%  96%  Weight:    85.8 kg  Height:        Intake/Output Summary (Last 24 hours) at 11/02/2018 0755 Last data filed at 11/02/2018 0300 Gross per 24 hour  Intake 2386.05 ml  Output -  Net 2386.05 ml   Filed Weights   10/31/18 0429 11/01/18 0626 11/02/18 0414  Weight: 85.5 kg 85.5 kg 85.8 kg    Telemetry    AV pacing - Personally Reviewed  ECG    No new - Personally Reviewed  Physical Exam   GEN: No acute distress.   Neck: No JVD Cardiac: RRR, no murmurs, rubs, or gallops. Rt wrist cath site with ecchymosis  Respiratory: Clear to auscultation bilaterally. GI: Soft, nontender, non-distended  MS: No edema; No deformity. Neuro:  Nonfocal  Psych: Normal affect   Labs    Chemistry Recent Labs  Lab 10/31/18 0348 11/01/18 0510 11/02/18 0351  NA 139 139 139  K 3.8 3.7 3.7  CL 106 111 110  CO2 24 22 24   GLUCOSE 95 101* 113*  BUN 26* 24* 20  CREATININE 1.72* 1.50* 1.60*  CALCIUM 9.9 9.2 9.0  PROT 6.6  --   --   ALBUMIN 3.6  --   --   AST 20  --   --   ALT 17  --    --   ALKPHOS 44  --   --   BILITOT 0.6  --   --   GFRNONAA 27* 32* 30*  GFRAA 32* 38* 35*  ANIONGAP 9 6 5      Hematology Recent Labs  Lab 11/01/18 0510 11/01/18 1547 11/02/18 0351  WBC 6.7 7.0 7.3  RBC 3.56* 3.36* 2.95*  HGB 11.2* 10.2* 9.3*  HCT 33.5* 31.6* 28.3*  MCV 94.1 94.0 95.9  MCH 31.5 30.4 31.5  MCHC 33.4 32.3 32.9  RDW 12.3 12.4 12.5  PLT 162 178 141*    Cardiac Enzymes Recent Labs  Lab 10/30/18 1722 10/31/18 0727 10/31/18 1302 10/31/18 1856  TROPONINI 0.21* 0.09* 0.07* 0.04*   No results for input(s): TROPIPOC in the last 168 hours.   BNPNo results for input(s): BNP, PROBNP in the last 168 hours.   DDimer No results for input(s): DDIMER in the last 168 hours.   Radiology    No results found.  Cardiac Studies   Cardiac cath Conclusions: 1. Mild to  moderate, non-obstructive CAD. Most severe lesion is a 50-60% stenosis in the proximal LCx that is not hemodynamically significant (DFR 1.0).  I suspect mild troponin elevation reflects supply-demand mismatch in the setting of uncontrolled hypertension and renal insufficiency. 2. Normal left ventricular filling pressure.  Recommendations: 1. Medical therapy and risk factor modification to prevent progression of disease. 2. Consider noninvasive evaluation for renal artery stenosis (if not already performed as an outpatient), given hypertension and renal insufficiency.  Recommend to resume Apixaban, at currently prescribed dose and frequency, on 11/02/18 if no evidence of bleeding or vascular injury.  Concurrent antiplatelet therapy not recommended.   Echo 11/01/18 Study Conclusions  - Left ventricle: The cavity size was normal. Wall thickness was increased in a pattern of mild LVH. Systolic function was normal. The estimated ejection fraction was in the range of 55% to 60%. Wall motion was normal; there were no regional wall motion abnormalities. Doppler parameters are consistent with  abnormal left ventricular relaxation (grade 1 diastolic dysfunction). Doppler parameters are consistent with indeterminate ventricular filling pressure. - Aortic valve: Moderately calcified annulus. Trileaflet; mildly thickened leaflets. - Mitral valve: Moderately calcified annulus. - Atrial septum: No defect or patent foramen ovale was identified. - Tricuspid valve: There was mild regurgitation.   Nuclear stress test 10/07/2018 Large size, moderate severity partially reversible inferior, apical and inferolateral perfusion defect, suggestive of ischemia. This could also represent pacemaker-related artifact. There is apical akinesis and inferoapical dyskinesis. LVEF 54%. This is an intermediate risk study. Clinical correlation is advised.  Echocardiogram 09/13/2018 - Left ventricle: The cavity size was normal. There was mild focal basal hypertrophy of the septum. Systolic function was mildly reduced. The estimated ejection fraction was in the range of 45% to 50%. Diffuse hypokinesis. Doppler parameters are consistent with both elevated ventricular end-diastolic filling pressure and elevated left atrial filling pressure. - Mitral valve: Severely calcified annulus. Mildly thickened, mildly calcified leaflets . - Left atrium: The atrium was mildly dilated. - Atrial septum: No defect or patent foramen ovale was identified.    Patient Profile     77 y.o. female with a hx of CHB, s/p pacemaker, difficult to controlhypertension, chronic kidney disease stage 3, paroxysmalatrial fibrillationon long termanticoagulationwith Apixaban and presumed CAD with + stress test 10/07/18, now admitted for NSTEMI with significant HTN.   Assessment & Plan    NSTEMI with abnl. nuc but non obstructive CAD by cath.  Treat medically --keep today to moniotor BP  CKD today 1.60 similar to prior to cath.  ? Resume avapro  Hypotension and Hypertension.  Labile BP at one point was 74  systolic and IV fluids given.  Off all BP meds.   S/p PPM AV pacing   PAF - may resume today if no bleeding at cath site.  Anemia drop from 11.2 to 9.3 today.  ? Hold eliquis  Bled at cath site, and she has rec'd fluids ? dilute      For questions or updates, please contact [facility] Please consult [email or web address] for contact info under        Signed, Nada BoozerLaura Rebeka Kimble, NP  11/02/2018, 7:55 AM

## 2018-11-02 NOTE — Progress Notes (Signed)
PROGRESS NOTE    Lauren Henry  GNF:[id number] DOB: August 27, 1941 DOA: 10/30/2018 PCP: Gaspar Garbe, MD   Brief Narrative:  77 year old with past medical history relevant for difficult to control hypertension, hypothyroidism, hyperlipidemia, CKD stage III, heart block status post permanent pacemaker, chronic atrial fibrillation on apixaban who was admitted for uncontrolled hypertension and found to have mildly elevated troponin status post cardiac catheterization showing nonobstructive coronary artery disease.   Assessment & Plan:   Active Problems:   Chest pain   #) accelerated hypertension: Patient apparently was admitted because of very elevated blood pressures at home.  She had minimal symptoms other than some mild chest pressure that led to the below catheterization.  Because of her elevated creatinine which is unclear if this is acute or chronic she does merit a work-up for secondary hypertension.  She did have an episode of hypotension immediately post catheterization. -Renal ultrasound ordered - Restart spironolactone 25 mg daily - Hold home losartan 40 mg daily - Hold carvedilol 40 mg daily -Hold clonidine 0.2 mg 3 times daily  #) Chest pressure with nonobstructive coronary artery disease: Cardiac catheterization on 11/01/2018 showed nonobstructive coronary artery disease. -We will restart beta-blocker per above -No need for aspirin as patient is on apixaban -Continue statin  #) Paroxysmal atrial fibrillation: -Continue apixaban 5 mg twice daily - Holding beta-blocker at this time  #) Hypothyroidism: -Continue levothyroxine 150 mcg  #) Hyperlipidemia: -Continue rosuvastatin 40 mg daily  Fluids: Tolerating p.o. Electro lites: Monitor and supplement Nutrition: Heart healthy diet  Prophylaxis: Apixaban  Disposition: Pending secondary hypertension work-up  Full code   Consultants:   Cardiology  Procedures: Echo 10/31/2018:- Left ventricle: The cavity size  was normal. Wall thickness was   increased in a pattern of mild LVH. Systolic function was normal.   The estimated ejection fraction was in the range of 55% to 60%.   Wall motion was normal; there were no regional wall motion   abnormalities. Doppler parameters are consistent with abnormal   left ventricular relaxation (grade 1 diastolic dysfunction).   Doppler parameters are consistent with indeterminate ventricular   filling pressure. - Aortic valve: Moderately calcified annulus. Trileaflet; mildly   thickened leaflets. - Mitral valve: Moderately calcified annulus. - Atrial septum: No defect or patent foramen ovale was identified. - Tricuspid valve: There was mild regurgitation.   11/01/2018 cardiac catheterization:Conclusions: 1. Mild to moderate, non-obstructive CAD. Most severe lesion is a 50-60% stenosis in the proximal LCx that is not hemodynamically significant (DFR 1.0).  I suspect mild troponin elevation reflects supply-demand mismatch in the setting of uncontrolled hypertension and renal insufficiency. 2. Normal left ventricular filling pressure.  Recommendations: 1. Medical therapy and risk factor modification to prevent progression of disease.  Consider noninvasive evaluation for renal artery stenosis (if not already performed as an outpatient), given hypertension and renal insufficiency.  Antimicrobials:  None   Subjective: This morning patient reports feeling quite weak.  She reports that her blood pressure was quite low yesterday and she did have significant amounts of bleeding from a cardiac catheterization site.  She has not been able to get up out of bed yet.  She denies any nausea, vomiting, diarrhea, cough, congestion, rhinorrhea.  She has not had any chest pain.  Objective: Vitals:   11/01/18 1711 11/01/18 1726 11/01/18 1931 11/02/18 0414  BP: (!) 127/59 (!) 137/59 (!) 129/57 123/79  Pulse: 60 61  61  Resp:      Temp:   98.6 F (37 C)  98.6 F (37 C)    TempSrc:   Oral Oral  SpO2: 96% 98%  96%  Weight:    85.8 kg  Height:        Intake/Output Summary (Last 24 hours) at 11/02/2018 1034 Last data filed at 11/02/2018 1030 Gross per 24 hour  Intake 2389.05 ml  Output -  Net 2389.05 ml   Filed Weights   10/31/18 0429 11/01/18 0626 11/02/18 0414  Weight: 85.5 kg 85.5 kg 85.8 kg    Examination:  General exam: Appears calm and comfortable  Respiratory system: Clear to auscultation. Respiratory effort normal. Cardiovascular system: Regular rate and rhythm, no murmurs Gastrointestinal system: Soft, nondistended, no rebound or guarding Central nervous system: Alert and oriented. No focal neurological deficits. Extremities: No lower extremity edema Skin: Cardiac catheterization site in right upper extremity wrist is clean dry and intact Psychiatry: Judgement and insight appear normal. Mood & affect appropriate.     Data Reviewed: I have personally reviewed following labs and imaging studies  CBC: Recent Labs  Lab 10/30/18 1153 10/30/18 1852 10/31/18 0348 11/01/18 0510 11/01/18 1547 11/02/18 0351  WBC 8.1 6.9 6.6 6.7 7.0 7.3  NEUTROABS 5.9  --   --   --   --   --   HGB 12.8 11.9* 11.3* 11.2* 10.2* 9.3*  HCT 39.3 35.7* 33.9* 33.5* 31.6* 28.3*  MCV 95.6 93.5 94.4 94.1 94.0 95.9  PLT 201 185 178 162 178 141*   Basic Metabolic Panel: Recent Labs  Lab 10/30/18 1153 10/30/18 1852 10/31/18 0348 11/01/18 0510 11/02/18 0351  NA 140  --  139 139 139  K 3.9  --  3.8 3.7 3.7  CL 106  --  106 111 110  CO2 25  --  24 22 24   GLUCOSE 106*  --  95 101* 113*  BUN 21  --  26* 24* 20  CREATININE 1.45* 1.45* 1.72* 1.50* 1.60*  CALCIUM 10.3  --  9.9 9.2 9.0  MG  --  1.8  --   --   --    GFR: Estimated Creatinine Clearance: 31.8 mL/min (A) (by C-G formula based on SCr of 1.6 mg/dL (H)). Liver Function Tests: Recent Labs  Lab 10/31/18 0348  AST 20  ALT 17  ALKPHOS 44  BILITOT 0.6  PROT 6.6  ALBUMIN 3.6   No results for  input(s): LIPASE, AMYLASE in the last 168 hours. No results for input(s): AMMONIA in the last 168 hours. Coagulation Profile: No results for input(s): INR, PROTIME in the last 168 hours. Cardiac Enzymes: Recent Labs  Lab 10/30/18 1456 10/30/18 1722 10/31/18 0727 10/31/18 1302 10/31/18 1856  TROPONINI 0.09* 0.21* 0.09* 0.07* 0.04*   BNP (last 3 results) No results for input(s): PROBNP in the last 8760 hours. HbA1C: No results for input(s): HGBA1C in the last 72 hours. CBG: No results for input(s): GLUCAP in the last 168 hours. Lipid Profile: Recent Labs    10/30/18 1852  CHOL 122  HDL 52  LDLCALC 60  TRIG 51  CHOLHDL 2.3   Thyroid Function Tests: Recent Labs    10/30/18 1852  TSH 4.554*   Anemia Panel: No results for input(s): VITAMINB12, FOLATE, FERRITIN, TIBC, IRON, RETICCTPCT in the last 72 hours. Sepsis Labs: No results for input(s): PROCALCITON, LATICACIDVEN in the last 168 hours.  No results found for this or any previous visit (from the past 240 hour(s)).       Radiology Studies: No results found.      Scheduled  Meds: . apixaban  5 mg Oral BID  . aspirin  81 mg Oral Daily  . cholecalciferol  5,000 Units Oral Daily  . levothyroxine  150 mcg Oral QAC breakfast  . multivitamin with minerals  1 tablet Oral Daily  . rosuvastatin  40 mg Oral Daily  . sodium chloride flush  3 mL Intravenous Q12H   Continuous Infusions: . sodium chloride       LOS: 2 days    Time spent: 35    Delaine LameShrey C Enis Leatherwood, MD [facility]  If 7PM-7AM, please contact night-coverage [email or web address] Password [email or web address] 11/02/2018, 10:34 AM

## 2018-11-02 NOTE — Evaluation (Signed)
Physical Therapy Evaluation Patient Details Name: Lauren Henry MRN: [id number] DOB: 08/19/1941 Today's Date: 11/02/2018   History of Present Illness  77yo female arriving to ED with systolic BP at 240, L chest pain 2/10, also with recent cardiac stress test showing intermediate risk. Admitted for HTN urgency, CAD, possible NSTEMI. PMH PPM, hypothyroidism, A-fib, TKR   Clinical Impression  Patient received in bed with family present, pleasant and willing to work with PT today. Able to complete bed mobility with Mod(I), and requires only S for functional transfers, toileting, and gait approximately 650ft with no device and slow but steady self-selected pace. No shortness of breath or chest pain noted during activity. Per her report she is generally at her baseline level of function. She was left sitting at the edge of the bed with all needs met, family present, and all questions/concerns addressed. She does not appear to be in need of skilled PT services in the acute setting or moving forward, although do strongly recommend she participate in cardiac rehab programming. PT signing off for now, thank you for the referral.     Follow Up Recommendations Other (comment)(cardiac rehab )    Equipment Recommendations  None recommended by PT    Recommendations for Other Services Other (comment)(cardiac rehab )     Precautions / Restrictions Precautions Precautions: Fall Restrictions Weight Bearing Restrictions: No      Mobility  Bed Mobility Overal bed mobility: Modified Independent                Transfers Overall transfer level: Needs assistance Equipment used: None Transfers: Sit to/from Stand Sit to Stand: Supervision         General transfer comment: S for safety, no physical assist given   Ambulation/Gait Ambulation/Gait assistance: Supervision Gait Distance (Feet): 650 Feet Assistive device: None Gait Pattern/deviations: Step-through pattern;Decreased step length -  right;Decreased step length - left;Decreased stride length Gait velocity: decreased    General Gait Details: slow but steady gait speed with minimal gait deviation, no significant unsteadiness noted; no SOB or symptoms during gait   Stairs            Wheelchair Mobility    Modified Rankin (Stroke Patients Only)       Balance Overall balance assessment: No apparent balance deficits (not formally assessed)                                           Pertinent Vitals/Pain Pain Assessment: 0-10 Pain Score: 2  Pain Location: L shoulder  Pain Descriptors / Indicators: Aching;Sore Pain Intervention(s): Limited activity within patient's tolerance;Monitored during session    Home Living Family/patient expects to be discharged to:: Private residence Living Arrangements: Spouse/significant other Available Help at Discharge: Family;Available PRN/intermittently Type of Home: House Home Access: Stairs to enter Entrance Stairs-Rails: Left Entrance Stairs-Number of Steps: 3 Home Layout: One level Home Equipment: Walker - 2 wheels;Cane - single point      Prior Function Level of Independence: Independent               Hand Dominance        Extremity/Trunk Assessment   Upper Extremity Assessment Upper Extremity Assessment: Generalized weakness    Lower Extremity Assessment Lower Extremity Assessment: Generalized weakness    Cervical / Trunk Assessment Cervical / Trunk Assessment: Kyphotic  Communication   Communication: No difficulties  Cognition Arousal/Alertness: Awake/alert Behavior   During Therapy: WFL for tasks assessed/performed Overall Cognitive Status: Within Functional Limits for tasks assessed                                        General Comments      Exercises     Assessment/Plan    PT Assessment Patent does not need any further PT services  PT Problem List         PT Treatment Interventions      PT  Goals (Current goals can be found in the Care Plan section)  Acute Rehab PT Goals Patient Stated Goal: go home  PT Goal Formulation: With patient/family Time For Goal Achievement: 11/16/18 Potential to Achieve Goals: Good    Frequency     Barriers to discharge        Co-evaluation               AM-PAC PT "6 Clicks" Daily Activity  Outcome Measure Difficulty turning over in bed (including adjusting bedclothes, sheets and blankets)?: None Difficulty moving from lying on back to sitting on the side of the bed? : None Difficulty sitting down on and standing up from a chair with arms (e.g., wheelchair, bedside commode, etc,.)?: None Help needed moving to and from a bed to chair (including a wheelchair)?: None Help needed walking in hospital room?: A Little Help needed climbing 3-5 steps with a railing? : A Little 6 Click Score: 22    End of Session   Activity Tolerance: Patient tolerated treatment well Patient left: in bed;with family/visitor present;with call bell/phone within reach(sitting at EOB )   PT Visit Diagnosis: Muscle weakness (generalized) (M62.81)    Time: 0017-4944 PT Time Calculation (min) (ACUTE ONLY): 17 min   Charges:   PT Evaluation $PT Eval Low Complexity: 1 Low          Deniece Ree PT, DPT, CBIS  Supplemental Physical Therapist [place]    Pager [phone] Acute Rehab Office [phone]

## 2018-11-03 ENCOUNTER — Ambulatory Visit (INDEPENDENT_AMBULATORY_CARE_PROVIDER_SITE_OTHER): Payer: Medicare Other | Admitting: *Deleted

## 2018-11-03 ENCOUNTER — Telehealth: Payer: Self-pay | Admitting: Cardiology

## 2018-11-03 DIAGNOSIS — R778 Other specified abnormalities of plasma proteins: Secondary | ICD-10-CM

## 2018-11-03 DIAGNOSIS — R7989 Other specified abnormal findings of blood chemistry: Secondary | ICD-10-CM

## 2018-11-03 DIAGNOSIS — I10 Essential (primary) hypertension: Secondary | ICD-10-CM

## 2018-11-03 DIAGNOSIS — I442 Atrioventricular block, complete: Secondary | ICD-10-CM | POA: Diagnosis not present

## 2018-11-03 LAB — CBC
HEMATOCRIT: 29.9 % — AB (ref 36.0–46.0)
Hemoglobin: 9.8 g/dL — ABNORMAL LOW (ref 12.0–15.0)
MCH: 30.7 pg (ref 26.0–34.0)
MCHC: 32.8 g/dL (ref 30.0–36.0)
MCV: 93.7 fL (ref 80.0–100.0)
Platelets: 145 10*3/uL — ABNORMAL LOW (ref 150–400)
RBC: 3.19 MIL/uL — ABNORMAL LOW (ref 3.87–5.11)
RDW: 12.3 % (ref 11.5–15.5)
WBC: 7.9 10*3/uL (ref 4.0–10.5)
nRBC: 0 % (ref 0.0–0.2)

## 2018-11-03 MED ORDER — HYDRALAZINE HCL 20 MG/ML IJ SOLN
10.0000 mg | Freq: Once | INTRAMUSCULAR | Status: AC
  Administered 2018-11-03: 10 mg via INTRAVENOUS
  Filled 2018-11-03: qty 1

## 2018-11-03 MED ORDER — ASPIRIN 81 MG PO CHEW: 81.0000 mg | CHEWABLE_TABLET | Freq: Every day | ORAL | 1 refills | Status: DC

## 2018-11-03 NOTE — Telephone Encounter (Signed)
LMOVM reminding pt to send remote transmission.   

## 2018-11-03 NOTE — Discharge Summary (Signed)
Physician Discharge Summary  Lauren Henry [name]:[id number] DOB: 01/14/1941 DOA: 10/30/2018  PCP: Gaspar Garbeisovec, Lauren Henry  Admit date: 10/30/2018 Discharge date: 11/03/2018  Admitted From Home Disposition:  Home  Recommendations for Outpatient Follow-up:  1. Follow up with PCP in 1-2 weeks 2. Please obtain BMP/CBC in one week 3. Please check your blood pressure and write it down every day.  Home Health: No Equipment/Devices:No  Discharge Condition: stable CODE STATUS: FULL Diet recommendation: Heart Healthy  Brief/Interim Summary:  #) Accelerated hypertension: She was admitted with home readings that showed systolics in the 240s.  Initially she was admitted with a severely elevated blood pressure but immediately post catheterization patient had significant hypotension in her home blood pressure medications were discontinued.  Because she had mild renal insufficiency and very hard to control blood pressures a renal ultrasound was ordered which showed signs and symptoms consistent with chronic kidney disease.  She was restarted on her home spironolactone with improvement in her blood pressure.  She may restart her home blood pressure medications including carvedilol, clonidine, losartan, spinal lactone on discharge.  On discharge.  She likely has a new diagnosis of CKD which explains her accelerated hypertension.  #) Stage III CKD: This is likely a new diagnosis.  Patient presented with elevated creatinine likely consistent with CKD.  This likely explains why her blood pressures been more difficult to control.  She will need outpatient referral to a nephrologist.  #) Chest pressure/elevated troponin: Patient was admitted with mild chest pressure and elevated troponin.  EKG did not show any acute MI.  Patient was taken for cardiac catheterization that showed nonobstructive coronary artery disease.  She was started on a baby aspirin per the recommendations of cardiology.  #) Paroxysmal atrial  fibrillation: Patient was continued on apixaban.  #) Hypothyroidism: Patient was continued on home levothyroxine.  Discharge Diagnoses:  Active Problems:   Chest pain   Elevated troponin   HTN (hypertension)    Discharge Instructions  Discharge Instructions    Amb Referral to Cardiac Rehabilitation   Complete by:  As directed    Diagnosis:  NSTEMI   Call Henry for:  difficulty breathing, headache or visual disturbances   Complete by:  As directed    Call Henry for:  hives   Complete by:  As directed    Call Henry for:  persistant dizziness or light-headedness   Complete by:  As directed    Call Henry for:  persistant nausea and vomiting   Complete by:  As directed    Call Henry for:  redness, tenderness, or signs of infection (pain, swelling, redness, odor or green/yellow discharge around incision site)   Complete by:  As directed    Call Henry for:  severe uncontrolled pain   Complete by:  As directed    Call Henry for:  temperature >100.4   Complete by:  As directed    Diet - low sodium heart healthy   Complete by:  As directed    Discharge instructions   Complete by:  As directed    Please follow-up with your primary care doctor in 1 week.  Please follow-up with your cardiologist in 1 to 2 weeks.  Please check your blood pressure daily.   Increase activity slowly   Complete by:  As directed      Allergies as of 11/03/2018      Reactions   Amlodipine Swelling, Rash      Medication List    TAKE these medications  acetaminophen 500 MG tablet Commonly known as:  TYLENOL Take 1,000 mg by mouth at bedtime as needed (pain). For leg pain   apixaban 5 MG Tabs tablet Commonly known as:  ELIQUIS Take 1 tablet (5 mg total) by mouth 2 (two) times daily.   aspirin 81 MG chewable tablet Chew 1 tablet (81 mg total) by mouth daily. Start taking on:  11/04/2018   carvedilol 40 MG 24 hr capsule Commonly known as:  COREG CR Take 40 mg by mouth daily.   cloNIDine 0.2 MG tablet Commonly  known as:  CATAPRES Take 1 tablet (0.2 mg total) by mouth 3 (three) times daily.   levothyroxine 150 MCG tablet Commonly known as:  SYNTHROID, LEVOTHROID Take 150 mcg by mouth. Take 1 tablet by mouth and take an extra 1/2 tablet on two days   multivitamin with minerals Tabs tablet Take 1 tablet by mouth daily.   olmesartan 40 MG tablet Commonly known as:  BENICAR Take 1 tablet (40 mg total) by mouth daily.   rosuvastatin 40 MG tablet Commonly known as:  CRESTOR Take 1 tablet (40 mg total) by mouth daily.   spironolactone 25 MG tablet Commonly known as:  ALDACTONE Take 25 mg by mouth daily.   Vitamin D3 125 MCG (5000 UT) Caps Take 5,000 Units by mouth daily.       Allergies  Allergen Reactions  . Amlodipine Swelling and Rash    Consultations:  Cardiology   Procedures/Studies: Dg Chest 2 View  Result Date: 10/30/2018 CLINICAL DATA:  Pt arrived via [facility]. 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 Pt reports that she did have some chest pain this morning for a few seconds, non radiating. Pt states that her clonidine dose was increased from 2x to 3x per day three weeks ago. Initial blood pressure for EMS was 130/100. Hx of HTN, A-fib, dysrhythmia. EXAM: CHEST - 2 VIEW COMPARISON:  11/02/2014 FINDINGS: Cardiac silhouette is normal in size. Stable left anterior chest wall sequential pacemaker. No mediastinal or hilar masses. No evidence of adenopathy. There are prominent bronchovascular markings diffusely, stable. No evidence of pneumonia or pulmonary edema. No pleural effusion or pneumothorax. Skeletal structures are demineralized but intact. IMPRESSION: No active cardiopulmonary disease. Electronically Signed   By: Amie Portland M.D.   On: 10/30/2018 13:11   Ct Head Wo Contrast  Result Date: 10/30/2018 CLINICAL DATA:  Hypertension, suspected subarachnoid hemorrhage clinically. EXAM: CT HEAD WITHOUT CONTRAST TECHNIQUE:  Contiguous axial images were obtained from the base of the skull through the vertex without intravenous contrast. COMPARISON:  None. FINDINGS: Brain: The brainstem, cerebellum, cerebral peduncles, thalami, basal ganglia, basilar cisterns, and ventricular system appear within normal limits. No intracranial hemorrhage, mass lesion, or acute CVA. Vascular: There is atherosclerotic calcification of the cavernous carotid arteries bilaterally. Skull: Unremarkable Sinuses/Orbits: Chronic right maxillary sinusitis. Other: No supplemental non-categorized findings. IMPRESSION: 1. No acute intracranial findings. 2. There is atherosclerotic calcification of the cavernous carotid arteries bilaterally. 3. Chronic right maxillary sinusitis. Electronically Signed   By: Gaylyn Rong M.D.   On: 10/30/2018 13:44   US Renal  Result Date: 11/02/2018 CLINICAL DATA:  Hypertension.  Urinary frequency. EXAM: RENAL / URINARY TRACT ULTRASOUND COMPLETE COMPARISON:  No prior. FINDINGS: Right Kidney: Renal measurements: 9.8 x 4.2 x 4.9 cm = volume: 104.4 mL. Cortical thinning. Echogenicity within normal limits. No mass or hydronephrosis visualized. Left Kidney: Renal measurements: 8.9 x 4.5 x 4.4 = volume: 92.5 mL.  Cortical thinning. Echogenicity within normal limits. No mass or hydronephrosis visualized. Bladder: Not visualized. IMPRESSION: 1.  Bilateral renal cortical thinning. 2.  Exam otherwise unremarkable. Electronically Signed   By: Maisie Fus  Register   On: 11/02/2018 14:23   Echo 10/31/2018:- Left ventricle: The cavity size was normal. Wall thickness was increased in a pattern of mild LVH. Systolic function was normal. The estimated ejection fraction was in the range of 55% to 60%. Wall motion was normal; there were no regional wall motion abnormalities. Doppler parameters are consistent with abnormal left ventricular relaxation (grade 1 diastolic dysfunction). Doppler parameters are consistent with  indeterminate ventricular filling pressure. - Aortic valve: Moderately calcified annulus. Trileaflet; mildly thickened leaflets. - Mitral valve: Moderately calcified annulus. - Atrial septum: No defect or patent foramen ovale was identified. - Tricuspid valve: There was mild regurgitation.     11/01/2018 cardiac catheterization:Conclusions: 1. Mild to moderate, non-obstructive CAD. Most severe lesion is a 50-60% stenosis in the proximal LCx that is not hemodynamically significant (DFR 1.0). I suspect mild troponin elevation reflects supply-demand mismatch in the setting of uncontrolled hypertension and renal insufficiency. 2. Normal left ventricular filling pressure.  Recommendations: 1. Medical therapy and risk factor modification to prevent progression of disease.  Consider noninvasive evaluation for renal artery stenosis (if not already performed as an outpatient), given hypertension and renal insufficiency.    Subjective:   Discharge Exam: Vitals:   11/03/18 0938 11/03/18 0949  BP: 137/71 (!) 145/72  Pulse:  73  Resp:    Temp:    SpO2:  99%   Vitals:   11/03/18 0300 11/03/18 0429 11/03/18 0938 11/03/18 0949  BP: (!) 146/68 (!) 126/54 137/71 (!) 145/72  Pulse: 62 74  73  Resp: 16 16    Temp:  98.3 F (36.8 C)    TempSrc:  Oral    SpO2: 97% 98%  99%  Weight:      Height:        General exam: Appears calm and comfortable  Respiratory system: Clear to auscultation. Respiratory effort normal. Cardiovascular system: Regular rate and rhythm, no murmurs Gastrointestinal system: Soft, nondistended, no rebound or guarding Central nervous system: Alert and oriented. No focal neurological deficits. Extremities: No lower extremity edema Skin: Cardiac catheterization site in right upper extremity wrist is clean dry and intact Psychiatry: Judgement and insight appear normal. Mood & affect appropriate.     The results of significant diagnostics from this  hospitalization (including imaging, microbiology, ancillary and laboratory) are listed below for reference.     Microbiology: No results found for this or any previous visit (from the past 240 hour(s)).   Labs: BNP (last 3 results) No results for input(s): BNP in the last 8760 hours. Basic Metabolic Panel: Recent Labs  Lab 10/30/18 1153 10/30/18 1852 10/31/18 0348 11/01/18 0510 11/02/18 0351  NA 140  --  139 139 139  K 3.9  --  3.8 3.7 3.7  CL 106  --  106 111 110  CO2 25  --  24 22 24   GLUCOSE 106*  --  95 101* 113*  BUN 21  --  26* 24* 20  CREATININE 1.45* 1.45* 1.72* 1.50* 1.60*  CALCIUM 10.3  --  9.9 9.2 9.0  MG  --  1.8  --   --   --    Liver Function Tests: Recent Labs  Lab 10/31/18 0348  AST 20  ALT 17  ALKPHOS 44  BILITOT 0.6  PROT 6.6  ALBUMIN 3.6  No results for input(s): LIPASE, AMYLASE in the last 168 hours. No results for input(s): AMMONIA in the last 168 hours. CBC: Recent Labs  Lab 10/30/18 1153  10/31/18 0348 11/01/18 0510 11/01/18 1547 11/02/18 0351 11/03/18 0353  WBC 8.1   < > 6.6 6.7 7.0 7.3 7.9  NEUTROABS 5.9  --   --   --   --   --   --   HGB 12.8   < > 11.3* 11.2* 10.2* 9.3* 9.8*  HCT 39.3   < > 33.9* 33.5* 31.6* 28.3* 29.9*  MCV 95.6   < > 94.4 94.1 94.0 95.9 93.7  PLT 201   < > 178 162 178 141* 145*   < > = values in this interval not displayed.   Cardiac Enzymes: Recent Labs  Lab 10/30/18 1456 10/30/18 1722 10/31/18 0727 10/31/18 1302 10/31/18 1856  TROPONINI 0.09* 0.21* 0.09* 0.07* 0.04*   BNP: Invalid input(s): POCBNP CBG: No results for input(s): GLUCAP in the last 168 hours. D-Dimer No results for input(s): DDIMER in the last 72 hours. Hgb A1c No results for input(s): HGBA1C in the last 72 hours. Lipid Profile No results for input(s): CHOL, HDL, LDLCALC, TRIG, CHOLHDL, LDLDIRECT in the last 72 hours. Thyroid function studies No results for input(s): TSH, T4TOTAL, T3FREE, THYROIDAB in the last 72  hours.  Invalid input(s): FREET3 Anemia work up No results for input(s): VITAMINB12, FOLATE, FERRITIN, TIBC, IRON, RETICCTPCT in the last 72 hours. Urinalysis    Component Value Date/Time   COLORURINE YELLOW 10/26/2015 0957   APPEARANCEUR CLOUDY (A) 10/26/2015 0957   LABSPEC 1.013 10/26/2015 0957   PHURINE 5.5 10/26/2015 0957   GLUCOSEU NEGATIVE 10/26/2015 0957   HGBUR NEGATIVE 10/26/2015 0957   BILIRUBINUR NEGATIVE 10/26/2015 0957   KETONESUR NEGATIVE 10/26/2015 0957   PROTEINUR NEGATIVE 10/26/2015 0957   UROBILINOGEN 0.2 10/26/2015 0957   NITRITE NEGATIVE 10/26/2015 0957   LEUKOCYTESUR TRACE (A) 10/26/2015 0957   Sepsis Labs Invalid input(s): PROCALCITONIN,  WBC,  LACTICIDVEN Microbiology No results found for this or any previous visit (from the past 240 hour(s)).   Time coordinating discharge: 35  SIGNED:   Delaine Lame, Henry  [facility] 11/03/2018, 10:47 AM  If 7PM-7AM, please contact night-coverage [email or web address] Password [email or web address]

## 2018-11-03 NOTE — Progress Notes (Signed)
CARDIAC REHAB PHASE I   PRE:  Rate/Rhythm: v paced 68  BP:  Supine: 137/71  Sitting:   Standing:    SaO2: 96%RA  MODE:  Ambulation: 600 ft   POST:  Rate/Rhythm: v paced 80's  BP:  Supine:   Sitting: 145/72  Standing:    SaO2: 99%RA 0935-0955 Pt walked 600 ft on RA with hand held asst and tolerated well. No CP. BP as documented.   Luetta Nuttingharlene Wilver Tignor, RN BSN  11/03/2018 9:52 AM

## 2018-11-03 NOTE — Progress Notes (Signed)
Progress Note  Patient Name: Lauren Henry Date of Encounter: 11/03/2018  Primary Cardiologist: Chilton Siiffany Tye, MD   Subjective   Denies any CP or SOB. "had a bad night" due to uncontrolled BP.  Inpatient Medications    Scheduled Meds: . apixaban  5 mg Oral BID  . aspirin  81 mg Oral Daily  . cholecalciferol  5,000 Units Oral Daily  . irbesartan  300 mg Oral Daily  . levothyroxine  150 mcg Oral QAC breakfast  . multivitamin with minerals  1 tablet Oral Daily  . rosuvastatin  40 mg Oral Daily  . sodium chloride flush  3 mL Intravenous Q12H   Continuous Infusions: . sodium chloride     PRN Meds: sodium chloride, ondansetron (ZOFRAN) IV, sodium chloride flush, zolpidem   Vital Signs    Vitals:   11/02/18 2338 11/03/18 0131 11/03/18 0300 11/03/18 0429  BP: (!) 181/72 (!) 127/57 (!) 146/68 (!) 126/54  Pulse:   62 74  Resp:   16 16  Temp:    98.3 F (36.8 C)  TempSrc:    Oral  SpO2:   97% 98%  Weight:      Height:        Intake/Output Summary (Last 24 hours) at 11/03/2018 0751 Last data filed at 11/02/2018 1758 Gross per 24 hour  Intake 723 ml  Output -  Net 723 ml   Filed Weights   10/31/18 0429 11/01/18 0626 11/02/18 0414  Weight: 85.5 kg 85.5 kg 85.8 kg    Telemetry    Paced rhythm - Personally Reviewed  ECG    Paced rhythm - Personally Reviewed  Physical Exam   GEN: No acute distress.   Neck: No JVD Cardiac: RRR, no murmurs, rubs, or gallops.  Respiratory: Clear to auscultation bilaterally. GI: Soft, nontender, non-distended  MS: No edema; No deformity. Neuro:  Nonfocal  Psych: Normal affect   Labs    Chemistry Recent Labs  Lab 10/31/18 0348 11/01/18 0510 11/02/18 0351  NA 139 139 139  K 3.8 3.7 3.7  CL 106 111 110  CO2 24 22 24   GLUCOSE 95 101* 113*  BUN 26* 24* 20  CREATININE 1.72* 1.50* 1.60*  CALCIUM 9.9 9.2 9.0  PROT 6.6  --   --   ALBUMIN 3.6  --   --   AST 20  --   --   ALT 17  --   --   ALKPHOS 44  --   --     BILITOT 0.6  --   --   GFRNONAA 27* 32* 30*  GFRAA 32* 38* 35*  ANIONGAP 9 6 5      Hematology Recent Labs  Lab 11/01/18 1547 11/02/18 0351 11/03/18 0353  WBC 7.0 7.3 7.9  RBC 3.36* 2.95* 3.19*  HGB 10.2* 9.3* 9.8*  HCT 31.6* 28.3* 29.9*  MCV 94.0 95.9 93.7  MCH 30.4 31.5 30.7  MCHC 32.3 32.9 32.8  RDW 12.4 12.5 12.3  PLT 178 141* 145*    Cardiac Enzymes Recent Labs  Lab 10/30/18 1722 10/31/18 0727 10/31/18 1302 10/31/18 1856  TROPONINI 0.21* 0.09* 0.07* 0.04*   No results for input(s): TROPIPOC in the last 168 hours.   BNPNo results for input(s): BNP, PROBNP in the last 168 hours.   DDimer No results for input(s): DDIMER in the last 168 hours.   Radiology    [place] Renal  Result Date: 11/02/2018 CLINICAL DATA:  Hypertension.  Urinary frequency. EXAM: RENAL / URINARY TRACT ULTRASOUND  COMPLETE COMPARISON:  No prior. FINDINGS: Right Kidney: Renal measurements: 9.8 x 4.2 x 4.9 cm = volume: 104.4 mL. Cortical thinning. Echogenicity within normal limits. No mass or hydronephrosis visualized. Left Kidney: Renal measurements: 8.9 x 4.5 x 4.4 = volume: 92.5 mL. Cortical thinning. Echogenicity within normal limits. No mass or hydronephrosis visualized. Bladder: Not visualized. IMPRESSION: 1.  Bilateral renal cortical thinning. 2.  Exam otherwise unremarkable. Electronically Signed   By: Maisie Fus  Register   On: 11/02/2018 14:23    Cardiac Studies   Echo 10/31/2018 LV EF: 55% -   60% Study Conclusions  - Left ventricle: The cavity size was normal. Wall thickness was   increased in a pattern of mild LVH. Systolic function was normal.   The estimated ejection fraction was in the range of 55% to 60%.   Wall motion was normal; there were no regional wall motion   abnormalities. Doppler parameters are consistent with abnormal   left ventricular relaxation (grade 1 diastolic dysfunction).   Doppler parameters are consistent with indeterminate ventricular   filling pressure. -  Aortic valve: Moderately calcified annulus. Trileaflet; mildly   thickened leaflets. - Mitral valve: Moderately calcified annulus. - Atrial septum: No defect or patent foramen ovale was identified. - Tricuspid valve: There was mild regurgitation.    Cath 11/01/2018 Conclusions: 1. Mild to moderate, non-obstructive CAD. Most severe lesion is a 50-60% stenosis in the proximal LCx that is not hemodynamically significant (DFR 1.0).  I suspect mild troponin elevation reflects supply-demand mismatch in the setting of uncontrolled hypertension and renal insufficiency. 2. Normal left ventricular filling pressure.  Recommendations: 1. Medical therapy and risk factor modification to prevent progression of disease. 2. Consider noninvasive evaluation for renal artery stenosis (if not already performed as an outpatient), given hypertension and renal insufficiency.  Recommend to resume Apixaban, at currently prescribed dose and frequency, on 11/02/18 if no evidence of bleeding or vascular injury.  Concurrent antiplatelet therapy not recommended.    Patient Profile     77 y.o. female with a hx ofCHB,s/p pacemaker, difficult to controlhypertension, chronic kidney disease stage 3, paroxysmalatrial fibrillationon long termanticoagulationwith Apixaban and presumed CADwith + stress test 10/07/18, now admitted for NSTEMI with significant HTN  Assessment & Plan    1. NSTEMI:  - abnormal myoview, cath 11/01/2018 showed 50-60% LCx, otherwise no culprit lesion. Elevated trop felt to be related to supply-demand mismatch in the setting of uncontrolled HTN and CKD. medical therapy.   - Echo 10/31/2018 shows EF 55-60%, grade 1 DD.  - continue ASA and statin.   2. CKD stage III: Cr yesterday was 1.6, near baseline. On Avapro.   3. S/p PPM: paced rhythm on telemetry  4. PAF: continue eliquis.   5. Anemia: hgb dropped 1 unit yesterday but stable overnight, 9.3 --> 9.8. Repeat CBC in 1 week  6.  Labile HTN: unclear why her BP is so labile. SBP was 180s overnight, improved to 120s this morning after given a dose of clonidine. Avapro restarted yesterday afternoon. Need repeat BMET in 1 week. May consider some PRN hydralazine at home for SBP >160.   - otherwise stable for discharge.   For questions or updates, please contact [facility] Please consult [email or web address] for contact info under        Signed, Azalee Course, PA  11/03/2018, 7:51 AM

## 2018-11-04 ENCOUNTER — Telehealth (HOSPITAL_COMMUNITY): Payer: Self-pay

## 2018-11-04 NOTE — Telephone Encounter (Signed)
Pt insurance is active and benefits verified through [facility] Medicare. Co-pay $20.00, DED $0.00/$0.00 met, out of pocket $4,000.00/$536.85 met, co-insurance 0%. No pre-authorization required. Passport, 11/04/18 @ 11:47AM, REF# [id number]  Will contact patient to see if she is interested in the Cardiac Rehab Program. If interested, patient will need to complete follow up appt. Once completed, patient will be contacted for scheduling upon review by the RN Navigator.

## 2018-11-04 NOTE — Progress Notes (Signed)
Remote pacemaker transmission.   

## 2018-11-04 NOTE — Telephone Encounter (Signed)
Called patient to see if she is interested in the Cardiac Rehab Program. Patient expressed interest. Explained scheduling process and went over insurance, patient verbalized understanding. Will contact patient for scheduling once f/u has been completed. 

## 2018-11-05 ENCOUNTER — Telehealth: Payer: Self-pay | Admitting: *Deleted

## 2018-11-05 NOTE — Telephone Encounter (Signed)
-----   Message from Chilton Siiffany [place], MD sent at 11/03/2018 12:58 PM EST ----- Continue statin.  No need to be on aspirin given that she is on Eliquis.

## 2018-11-05 NOTE — Telephone Encounter (Signed)
Advised patient, verbalized understanding  

## 2018-11-11 ENCOUNTER — Ambulatory Visit: Payer: Medicare Other | Admitting: Internal Medicine

## 2018-11-11 ENCOUNTER — Encounter: Payer: Self-pay | Admitting: Internal Medicine

## 2018-11-11 VITALS — BP 132/72 | HR 63 | Ht 65.0 in | Wt 190.2 lb

## 2018-11-11 DIAGNOSIS — I442 Atrioventricular block, complete: Secondary | ICD-10-CM | POA: Diagnosis not present

## 2018-11-11 DIAGNOSIS — I480 Paroxysmal atrial fibrillation: Secondary | ICD-10-CM

## 2018-11-11 DIAGNOSIS — Z950 Presence of cardiac pacemaker: Secondary | ICD-10-CM | POA: Diagnosis not present

## 2018-11-11 NOTE — Patient Instructions (Signed)

## 2018-11-11 NOTE — Progress Notes (Signed)
HPI Lauren Henry returns today for ongoing evaluation and management of complete heart block, hypertension, and obesity. She is status post pacemaker insertion. In the interim, she has been bothered by difficult to control high blood pressure. Her meds have been adjusted and her bp is improved. She was in the hospital and had a heart cath which revealed non-obstructive disease. Allergies  Allergen Reactions  . Amlodipine Swelling and Rash     Current Outpatient Medications  Medication Sig Dispense Refill  . acetaminophen (TYLENOL) 500 MG tablet Take 1,000 mg by mouth at bedtime as needed (pain). For leg pain    . apixaban (ELIQUIS) 5 MG TABS tablet Take 1 tablet (5 mg total) by mouth 2 (two) times daily. 180 tablet 1  . carvedilol (COREG CR) 40 MG 24 hr capsule Take 40 mg by mouth daily.    . Cholecalciferol (VITAMIN D3) 5000 UNITS CAPS Take 5,000 Units by mouth daily.    . cloNIDine (CATAPRES) 0.2 MG tablet Take 1 tablet (0.2 mg total) by mouth 3 (three) times daily. 270 tablet 3  . levothyroxine (SYNTHROID, LEVOTHROID) 150 MCG tablet Take 150 mcg by mouth. Take 1 tablet by mouth and take an extra 1/2 tablet on two days  0  . Multiple Vitamin (MULTIVITAMIN WITH MINERALS) TABS tablet Take 1 tablet by mouth daily.    Marland Kitchen olmesartan (BENICAR) 40 MG tablet Take 1 tablet (40 mg total) by mouth daily. 90 tablet 3  . rosuvastatin (CRESTOR) 40 MG tablet Take 1 tablet (40 mg total) by mouth daily. 90 tablet 3  . spironolactone (ALDACTONE) 25 MG tablet Take 25 mg by mouth daily.   0   No current facility-administered medications for this visit.      Past Medical History:  Diagnosis Date  . Arthritis   . Atrial fibrillation (HCC)   . Diarrhea    since gallbladder removed  . 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 History of blood transfusion as a child   no abnormal reaction noted  . History of colon polyps   . Hyperlipidemia    takes  Pravastatin daily  . Hypertension    takes Azor and Metoprolol daily  . Hypothyroidism    takes Synthroid daily  . Joint pain   . Presence of permanent cardiac pacemaker   . Urinary frequency   . Urinary urgency     ROS:   All systems reviewed and negative except as noted in the HPI.   Past Surgical History:  Procedure Laterality Date  . ABDOMINAL HYSTERECTOMY    . cataract surgery Bilateral   . CHOLECYSTECTOMY  1971  . COLONOSCOPY    . EYE SURGERY    . INSERT / REPLACE / REMOVE PACEMAKER     Medtronic PPM 11/01/14 (Dr. Lewayne Bunting)  . JOINT REPLACEMENT Right    knee  . LEFT HEART CATH AND CORONARY ANGIOGRAPHY N/A 11/01/2018   Procedure: LEFT HEART CATH AND CORONARY ANGIOGRAPHY;  Surgeon: Yvonne Kendall, MD;  Location: [facility] INVASIVE CV LAB;  Service: Cardiovascular;  Laterality: N/A;  . PERMANENT PACEMAKER INSERTION N/A 11/01/2014   Procedure: PERMANENT PACEMAKER INSERTION;  Surgeon: Marinus Maw, MD;  Location: [facility] CATH LAB;  Service: Cardiovascular;  Laterality: N/A;  . stomach stapled  1985  . TOTAL KNEE ARTHROPLASTY Left 11/05/2015   Procedure: TOTAL KNEE ARTHROPLASTY;  Surgeon: Dannielle Huh, MD;  Location: [facility] OR;  Service: Orthopedics;  Laterality:  Left;     Family History  Problem Relation Age of Onset  . Heart disease Father   . Heart attack Father   . Stroke Sister      Social History   Socioeconomic History  . Marital status: Married    Spouse name: Not on file  . Number of children: Not on file  . Years of education: Not on file  . Highest education level: Not on file  Occupational History  . Not on file  Social Needs  . Financial resource strain: Not on file  . Food insecurity:    Worry: Not on file    Inability: Not on file  . Transportation needs:    Medical: Not on file    Non-medical: Not on file  Tobacco Use  . Smoking status: Never Smoker  . Smokeless tobacco: Never Used  Substance and Sexual Activity  . Alcohol use: Yes     Alcohol/week: 1.0 standard drinks    Types: 1 Glasses of wine per week  . Drug use: No  . Sexual activity: Not Currently    Birth control/protection: Surgical  Lifestyle  . Physical activity:    Days per week: Not on file    Minutes per session: Not on file  . Stress: Not on file  Relationships  . Social connections:    Talks on phone: Not on file    Gets together: Not on file    Attends religious service: Not on file    Active member of club or organization: Not on file    Attends meetings of clubs or organizations: Not on file    Relationship status: Not on file  . Intimate partner violence:    Fear of current or ex partner: Not on file    Emotionally abused: Not on file    Physically abused: Not on file    Forced sexual activity: Not on file  Other Topics Concern  . Not on file  Social History Narrative  . Not on file     BP 132/72   Pulse 63   Ht 5\' 5"  (1.651 m)   Wt 190 lb 3.2 oz (86.3 kg)   SpO2 97%   BMI 31.65 kg/m   Physical Exam:  Well appearing NAD HEENT: Unremarkable Neck:  No JVD, no thyromegally Lymphatics:  No adenopathy Back:  No CVA tenderness Lungs:  Clear with no wheezes HEART:  Regular rate rhythm, no murmurs, no rubs, no clicks Abd:  soft, positive bowel sounds, no organomegally, no rebound, no guarding Ext:  2 plus pulses, no edema, no cyanosis, no clubbing Skin:  No rashes no nodules Neuro:  CN II through XII intact, motor grossly intact   DEVICE  Normal device function.  See PaceArt for details.   Assess/Plan: 1. Complete heart block - she has no escape today and is doing well, s/p PPM 2. HTN - she has had refractory HTN. Her BP is good today. She will continue her current meds and she is encouraged to lose some weight. 3. PPM - her Medtronic DDD PM is working normally. 4. Obesity - I have asked her to try and lose some weight.  Leonia ReevesGregg Lisandro Meggett,M.D.

## 2018-11-12 ENCOUNTER — Telehealth: Payer: Self-pay | Admitting: *Deleted

## 2018-11-12 NOTE — Telephone Encounter (Signed)
   [facility] Pre-operative Risk Assessment    Request for surgical clearance:  1. What type of surgery is being performed?  EGD and colonoscopy   2. When is this surgery scheduled? 11/23/18    3. What type of clearance is required (medical clearance vs. Pharmacy clearance to hold med vs. Both)? pharmacy  4. Are there any medications that need to be held prior to surgery and how long? Eliquis, length of time not specified. Stop Date _____; Restart Date _____.   5. Practice name and name of physician performing surgery? [facility] Dr. Dellis Filbert Medoff   6. What is your office phone number [phone]    7.   What is your office fax number [phone]  8.   Anesthesia type (None, local, MAC, general) ? Not listed   Rodman Key 11/12/2018, 11:09 AM  _________________________________________________________________   (provider comments below)

## 2018-11-12 NOTE — Telephone Encounter (Signed)
Pt takes Eliquis for afib with CHADS2VASc score of 5 (age x2, sex, HTN, CAD). CrCl is 7140mL/min, SCr 1.6. Ok to hold Eliquis for 2-3 days prior to procedure and resume within 24 hours after.

## 2018-11-12 NOTE — Telephone Encounter (Signed)
   Primary Cardiologist: Chilton Siiffany Luna, MD  Chart reviewed as part of pre-operative protocol coverage. Given past medical history and time since last visit (yesterday by Dr. Ladona Ridgelaylor), based on ACC/AHA guidelines, Kathryne HitchSandra D Kazmi would be at acceptable risk for the planned procedure without further cardiovascular testing.   Pt takes Eliquis for afib with CHADS2VASc score of 5 (age x2, sex, HTN, CAD). CrCl is 7640mL/min, SCr 1.6. Ok to hold Eliquis for 2-3 days prior to procedure and resume within 24 hours after.  I will route this recommendation to the requesting party via Epic fax function and remove from pre-op pool.  Please call with questions.  Berton BonJanine Kaisyn Reinhold, NP 11/12/2018, 3:36 PM

## 2018-11-15 ENCOUNTER — Ambulatory Visit: Payer: Medicare Other

## 2018-11-24 ENCOUNTER — Ambulatory Visit: Payer: Medicare Other | Admitting: Cardiology

## 2018-11-24 ENCOUNTER — Encounter: Payer: Self-pay | Admitting: Cardiology

## 2018-11-24 VITALS — BP 132/78 | HR 60 | Ht 65.0 in | Wt 189.0 lb

## 2018-11-24 DIAGNOSIS — I4891 Unspecified atrial fibrillation: Secondary | ICD-10-CM | POA: Diagnosis not present

## 2018-11-24 DIAGNOSIS — D649 Anemia, unspecified: Secondary | ICD-10-CM | POA: Insufficient documentation

## 2018-11-24 DIAGNOSIS — R778 Other specified abnormalities of plasma proteins: Secondary | ICD-10-CM

## 2018-11-24 DIAGNOSIS — D508 Other iron deficiency anemias: Secondary | ICD-10-CM

## 2018-11-24 DIAGNOSIS — R7989 Other specified abnormal findings of blood chemistry: Secondary | ICD-10-CM | POA: Diagnosis not present

## 2018-11-24 DIAGNOSIS — I10 Essential (primary) hypertension: Secondary | ICD-10-CM

## 2018-11-24 DIAGNOSIS — I2510 Atherosclerotic heart disease of native coronary artery without angina pectoris: Secondary | ICD-10-CM

## 2018-11-24 DIAGNOSIS — N183 Chronic kidney disease, stage 3 unspecified: Secondary | ICD-10-CM | POA: Insufficient documentation

## 2018-11-24 DIAGNOSIS — Z950 Presence of cardiac pacemaker: Secondary | ICD-10-CM

## 2018-11-24 DIAGNOSIS — Z7901 Long term (current) use of anticoagulants: Secondary | ICD-10-CM

## 2018-11-24 NOTE — Assessment & Plan Note (Signed)
On admission 11/01/18 when admitted with uncontrolled HTN and renal insignificancy. Troponin elevation was not felt to be secondary to [place] or CAD

## 2018-11-24 NOTE — Assessment & Plan Note (Signed)
Etiology not yet determined- colonoscopy and endoscopy negative (done 11/23/18). She may need small bowel capsul endoscopy, she is to f/u with Dr Kinnie ScalesMedoff.

## 2018-11-24 NOTE — Assessment & Plan Note (Signed)
50-60% mCFX at cath Nov 2019 after positive stress 

## 2018-11-24 NOTE — Assessment & Plan Note (Signed)
Eliquis- CHADS VASC=5 

## 2018-11-24 NOTE — Assessment & Plan Note (Signed)
She had labile HTN that started after she was taken of Norvasc for edema and rash.  She has never had renal artery dopplers and I would consider this if her B/P becomes uncontrolled.

## 2018-11-24 NOTE — Assessment & Plan Note (Signed)
MDT pacemaker implanted Nov 2015

## 2018-11-24 NOTE — Assessment & Plan Note (Signed)
Chronic renal insufficiency- 3b renal dopplers showed not hydronephrosis

## 2018-11-24 NOTE — Patient Instructions (Signed)
Medication Instructions:  Your physician recommends that you continue on your current medications as directed. Please refer to the Current Medication list given to you today.  If you need a refill on your cardiac medications before your next appointment, please call your pharmacy.   Lab work: Your physician recommends that you return for lab work 1 weeks prior to your office visit (Bmet, Cbc)  If you have labs (blood work) drawn today and your tests are completely normal, you will receive your results only by: Marland Kitchen. MyChart Message (if you have MyChart) OR . A paper copy in the mail If you have any lab test that is abnormal or we need to change your treatment, we will call you to review the results.  Testing/Procedures: None ordered  Follow-Up: At [facility], you and your health needs are our priority.  As part of our continuing mission to provide you with exceptional heart care, we have created designated Provider Care Teams.  These Care Teams include your primary Cardiologist (physician) and Advanced Practice Providers (APPs -  Physician Assistants and Nurse Practitioners) who all work together to provide you with the care you need, when you need it.  Follow up as planned with Dr.Hazen on 01/13/19 @ 9:20am  Any Other Special Instructions Will Be Listed Below (If Applicable).

## 2018-11-24 NOTE — Progress Notes (Signed)
11/24/2018 Lauren Henry   [birth year]  [id number]  Primary Physician Tisovec, Adelfa Koh, MD Primary Cardiologist: Dr Valetta Fuller Dr Ladona Ridgel  HPI: Lauren Henry is a pleasant 77 year old female followed by Dr. Duke Salvia and Dr. Ladona Ridgel.  She has a history of PAF and is on Eliquis.  She has complete heart block as well and had a Medtronic pacemaker implanted in November 2015.  She has hypertension in the month or so ago was taken off amlodipine because of a rash and lower extremity edema.  After that her blood pressure became difficult to control.  She presented to the emergency room 10/30/2018 with shortness of breath.  Her blood pressure was uncontrolled and she had acute on chronic renal insufficiency.  Her troponin was also elevated.  She was admitted and underwent diagnostic catheterization which revealed a 50 to 60% mid circumflex lesion and no other significant coronary disease.  It was felt that her elevated troponin was secondary to uncontrolled hypertension and acute on chronic renal insufficiency.  Her blood came under better control with adjustment in her medications.  Echocardiogram done 10/31/2018 showed mild LVH with an EF of 55 to 60% and grade 1 diastolic dysfunction.  She had moderate aortic calcification.  Her creatinine at discharge was 1.72.  She did have renal Dopplers which did not show hydronephrosis though she has not had renal artery Dopplers.  Since discharge she is been doing well.  During that admission she was also noted to be anemic and yesterday had a colonoscopy and endoscopy.  She tells me that no apparent source for bleeding was noted.  She has an appointment for follow-up to consider a capsule small bowel endoscopy.  Her blood pressure has been under better control since she got out of the hospital.  She brought in readings from home which shows several readings in the 120 systolic and an occasional reading of 160.  She is tolerating her current medications well.  She assures me  she is taking her clonidine 3 times a day.   Current Outpatient Medications  Medication Sig Dispense Refill  . acetaminophen (TYLENOL) 500 MG tablet Take 1,000 mg by mouth at bedtime as needed (pain). For leg pain    . apixaban (ELIQUIS) 5 MG TABS tablet Take 1 tablet (5 mg total) by mouth 2 (two) times daily. 180 tablet 1  . carvedilol (COREG CR) 40 MG 24 hr capsule Take 40 mg by mouth daily.    . Cholecalciferol (VITAMIN D3) 5000 UNITS CAPS Take 5,000 Units by mouth daily.    . cloNIDine (CATAPRES) 0.2 MG tablet Take 1 tablet (0.2 mg total) by mouth 3 (three) times daily. 270 tablet 3  . Ferrous Sulfate (IRON) 142 (45 Fe) MG TBCR Take by mouth.    . levothyroxine (SYNTHROID, LEVOTHROID) 150 MCG tablet Take 150 mcg by mouth. Take 1 tablet by mouth and take an extra 1/2 tablet on two days  0  . mirtazapine (REMERON) 7.5 MG tablet TAKE 1 TABLET BY MOUTH AT BEDTIME FOR DEPRESSION/SLEEP    . Multiple Vitamin (MULTIVITAMIN WITH MINERALS) TABS tablet Take 1 tablet by mouth daily.    Marland Kitchen olmesartan (BENICAR) 40 MG tablet Take 1 tablet (40 mg total) by mouth daily. 90 tablet 3  . pantoprazole (PROTONIX) 40 MG tablet Take 40 mg by mouth daily.  3  . rosuvastatin (CRESTOR) 40 MG tablet Take 1 tablet (40 mg total) by mouth daily. 90 tablet 3  . spironolactone (ALDACTONE) 25 MG tablet Take 25  mg by mouth daily.   0   No current facility-administered medications for this visit.     Allergies  Allergen Reactions  . Amlodipine Swelling and Rash    Past Medical History:  Diagnosis Date  . Arthritis   . Atrial fibrillation (HCC)   . Diarrhea    since gallbladder removed  . 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 History of blood transfusion as a child   no abnormal reaction noted  . History of colon polyps   . Hyperlipidemia    takes Pravastatin daily  . Hypertension    takes Azor and Metoprolol daily  . Hypothyroidism    takes Synthroid  daily  . Joint pain   . Presence of permanent cardiac pacemaker   . Urinary frequency   . Urinary urgency     Social History   Socioeconomic History  . Marital status: Married    Spouse name: Not on file  . Number of children: Not on file  . Years of education: Not on file  . Highest education level: Not on file  Occupational History  . Not on file  Social Needs  . Financial resource strain: Not on file  . Food insecurity:    Worry: Not on file    Inability: Not on file  . Transportation needs:    Medical: Not on file    Non-medical: Not on file  Tobacco Use  . Smoking status: Never Smoker  . Smokeless tobacco: Never Used  Substance and Sexual Activity  . Alcohol use: Yes    Alcohol/week: 1.0 standard drinks    Types: 1 Glasses of wine per week  . Drug use: No  . Sexual activity: Not Currently    Birth control/protection: Surgical  Lifestyle  . Physical activity:    Days per week: Not on file    Minutes per session: Not on file  . Stress: Not on file  Relationships  . Social connections:    Talks on phone: Not on file    Gets together: Not on file    Attends religious service: Not on file    Active member of club or organization: Not on file    Attends meetings of clubs or organizations: Not on file    Relationship status: Not on file  . Intimate partner violence:    Fear of current or ex partner: Not on file    Emotionally abused: Not on file    Physically abused: Not on file    Forced sexual activity: Not on file  Other Topics Concern  . Not on file  Social History Narrative  . Not on file     Family History  Problem Relation Age of Onset  . Heart disease Father   . Heart attack Father   . Stroke Sister      Review of Systems: General: negative for chills, fever, night sweats or weight changes.  Cardiovascular: negative for chest pain, dyspnea on exertion, edema, orthopnea, palpitations, paroxysmal nocturnal dyspnea or shortness of  breath Dermatological: negative for rash Respiratory: negative for cough or wheezing Urologic: negative for hematuria Abdominal: negative for nausea, vomiting, diarrhea, bright red blood per rectum, melena, or hematemesis Neurologic: negative for visual changes, syncope, or dizziness All other systems reviewed and are otherwise negative except as noted above.    Blood pressure 132/78, pulse 60, height 5\' 5"  (1.651 m), weight 189 lb (85.7 kg).  General appearance:  alert, cooperative, no distress and mildly obese Neck: no carotid bruit and no JVD Lungs: clear to auscultation bilaterally Heart: regular rate and rhythm and soft systolic mumur Extremities: no edema Skin: warm and dry Neurologic: Grossly normal   ASSESSMENT AND PLAN:   PAF (paroxysmal atrial fibrillation) (HCC) H/O PAF- SSS  Chronic anticoagulation Eliquis-CHADS VASC=5  CAD in native artery 50-60% mCFX at cath Nov 2019 after positive stress  Elevated troponin On admission 11/01/18 when admitted with uncontrolled HTN and renal insignificancy. Troponin elevation was not felt to be secondary to [place] or CAD  Chronic renal disease, stage III (HCC) Chronic renal insufficiency- 3b renal dopplers showed not hydronephrosis  Anemia Etiology not yet determined- colonoscopy and endoscopy negative (done 11/23/18). She may need small bowel capsul endoscopy, she is to f/u with Dr Kinnie ScalesMedoff.    HTN (hypertension) She had labile HTN that started after she was taken of Norvasc for edema and rash.  She has never had renal artery dopplers and I would consider this if her B/P becomes uncontrolled.  Presence of permanent cardiac pacemaker MDT pacemaker implanted Nov 2015   PLAN I suggested we continue with her current medical therapy.  Blood pressure seems to be under better control, if it became uncontrolled again I would consider renal artery Dopplers.  I will have her get a BM P and a CBC in a couple weeks.  She has a follow-up  with Dr. Kinnie ScalesMedoff and Dr. Duke Salviaandolph in January.  Corine ShelterLuke Anay Rathe PA-C 11/24/2018 2:17 PM

## 2018-11-24 NOTE — Assessment & Plan Note (Signed)
H/O PAF- SSS 

## 2018-11-26 NOTE — Telephone Encounter (Signed)
Called patient to see if she was interested in participating in the Cardiac Rehab Program. Patient stated yes. Patient will come in for orientation on 12/09/18 @ 130PM and will attend the 11:15AM exercise class.  Mailed homework package.

## 2018-12-03 NOTE — Progress Notes (Signed)
Lauren Henry 77 y.o. female DOB 03/02/1941 MRN [id number]       Nutrition  No diagnosis found. Past Medical History:  Diagnosis Date  . Arthritis   . Atrial fibrillation (HCC)   . Diarrhea    since gallbladder removed  . 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 History of blood transfusion as a child   no abnormal reaction noted  . History of colon polyps   . Hyperlipidemia    takes Pravastatin daily  . Hypertension    takes Azor and Metoprolol daily  . Hypothyroidism    takes Synthroid daily  . Joint pain   . Presence of permanent cardiac pacemaker   . Urinary frequency   . Urinary urgency    Meds reviewed.    Current Outpatient Medications (Endocrine & Metabolic):  .  levothyroxine (SYNTHROID, LEVOTHROID) 150 MCG tablet, Take 150 mcg by mouth. Take 1 tablet by mouth and take an extra 1/2 tablet on two days  Current Outpatient Medications (Cardiovascular):  .  carvedilol (COREG CR) 40 MG 24 hr capsule, Take 40 mg by mouth daily. .  cloNIDine (CATAPRES) 0.2 MG tablet, Take 1 tablet (0.2 mg total) by mouth 3 (three) times daily. Marland Kitchen.  olmesartan (BENICAR) 40 MG tablet, Take 1 tablet (40 mg total) by mouth daily. .  rosuvastatin (CRESTOR) 40 MG tablet, Take 1 tablet (40 mg total) by mouth daily. Marland Kitchen.  spironolactone (ALDACTONE) 25 MG tablet, Take 25 mg by mouth daily.    Current Outpatient Medications (Analgesics):  .  acetaminophen (TYLENOL) 500 MG tablet, Take 1,000 mg by mouth at bedtime as needed (pain). For leg pain  Current Outpatient Medications (Hematological):  .  apixaban (ELIQUIS) 5 MG TABS tablet, Take 1 tablet (5 mg total) by mouth 2 (two) times daily. .  Ferrous Sulfate (IRON) 142 (45 Fe) MG TBCR, Take by mouth.  Current Outpatient Medications (Other):  Marland Kitchen.  Cholecalciferol (VITAMIN D3) 5000 UNITS CAPS, Take 5,000 Units by mouth daily. .  mirtazapine (REMERON) 7.5 MG tablet, TAKE 1 TABLET BY MOUTH AT BEDTIME FOR  DEPRESSION/SLEEP .  Multiple Vitamin (MULTIVITAMIN WITH MINERALS) TABS tablet, Take 1 tablet by mouth daily. .  pantoprazole (PROTONIX) 40 MG tablet, Take 40 mg by mouth daily.   HT: Ht Readings from Last 1 Encounters:  11/24/18 5\' 5"  (1.651 m)    WT: Wt Readings from Last 5 Encounters:  11/24/18 189 lb (85.7 kg)  11/11/18 190 lb 3.2 oz (86.3 kg)  11/02/18 189 lb 3.2 oz (85.8 kg)  10/13/18 197 lb (89.4 kg)  10/07/18 195 lb (88.5 kg)     BMI = 31.45    11/24/18  Current tobacco use? No       Labs:  Lipid Panel     Component Value Date/Time   CHOL 122 10/30/2018 1852   TRIG 51 10/30/2018 1852   HDL 52 10/30/2018 1852   CHOLHDL 2.3 10/30/2018 1852   VLDL 10 10/30/2018 1852   LDLCALC 60 10/30/2018 1852    No results found for: HGBA1C CBG (last 3)  No results for input(s): GLUCAP in the last 72 hours.  Nutrition Diagnosis ? Food-and nutrition-related knowledge deficit related to lack of exposure to information as related to diagnosis of: ? CVD  ? Obese  I = 30-34.9 related to excessive energy intake as evidenced by a BMI = 31.45    11/24/18  Nutrition Goal(s):  ? To be determined  Plan:  Pt to attend nutrition classes ? Nutrition I ? Nutrition II ? Portion Distortion  ? Diabetes Blitz ? Diabetes Q & A Will provide client-centered nutrition education as part of interdisciplinary care.   Monitor and evaluate progress toward nutrition goal with team.  Ross Marcus, MS, RD, LDN 12/03/2018 2:27 PM

## 2018-12-04 ENCOUNTER — Other Ambulatory Visit: Payer: Self-pay | Admitting: Cardiovascular Disease

## 2018-12-07 ENCOUNTER — Telehealth (HOSPITAL_COMMUNITY): Payer: Self-pay | Admitting: Pharmacist

## 2018-12-08 ENCOUNTER — Other Ambulatory Visit: Payer: Self-pay | Admitting: Cardiovascular Disease

## 2018-12-09 ENCOUNTER — Inpatient Hospital Stay (HOSPITAL_COMMUNITY)
Admission: RE | Admit: 2018-12-09 | Discharge: 2018-12-09 | Disposition: A | Discharge status: Home / Self Care | Payer: Medicare Other | Source: Ambulatory Visit

## 2018-12-13 ENCOUNTER — Ambulatory Visit (HOSPITAL_COMMUNITY): Payer: Medicare Other

## 2018-12-17 ENCOUNTER — Ambulatory Visit (HOSPITAL_COMMUNITY): Payer: Medicare Other

## 2018-12-20 ENCOUNTER — Ambulatory Visit (HOSPITAL_COMMUNITY): Payer: Medicare Other

## 2018-12-24 ENCOUNTER — Ambulatory Visit (HOSPITAL_COMMUNITY): Payer: Medicare Other

## 2018-12-27 ENCOUNTER — Ambulatory Visit (HOSPITAL_COMMUNITY): Payer: Medicare Other

## 2018-12-29 ENCOUNTER — Ambulatory Visit (HOSPITAL_COMMUNITY): Payer: Medicare Other

## 2018-12-31 ENCOUNTER — Ambulatory Visit (HOSPITAL_COMMUNITY): Payer: Medicare Other

## 2019-01-02 LAB — CUP PACEART REMOTE DEVICE CHECK
Battery Impedance: 234 Ohm
Battery Remaining Longevity: 109 mo
Battery Voltage: 2.79 V
Brady Statistic AP VP Percent: 38 %
Brady Statistic AP VS Percent: 0 %
Brady Statistic AS VP Percent: 62 %
Brady Statistic AS VS Percent: 0 %
Date Time Interrogation Session: 20191113222828
Implantable Lead Implant Date: 20151111
Implantable Lead Location: 753859
Implantable Lead Location: 753860
Implantable Lead Model: 5076
Implantable Lead Model: 5076
Implantable Pulse Generator Implant Date: 20151111
Lead Channel Impedance Value: 418 Ohm
Lead Channel Impedance Value: 543 Ohm
Lead Channel Pacing Threshold Amplitude: 1.25 V
Lead Channel Pacing Threshold Pulse Width: 0.4 ms
Lead Channel Pacing Threshold Pulse Width: 0.4 ms
Lead Channel Setting Pacing Amplitude: 1.5 V
Lead Channel Setting Pacing Amplitude: 2.5 V
Lead Channel Setting Pacing Pulse Width: 0.4 ms
Lead Channel Setting Sensing Sensitivity: 4 mV
MDC IDC LEAD IMPLANT DT: 20151111
MDC IDC MSMT LEADCHNL RA PACING THRESHOLD AMPLITUDE: 0.5 V

## 2019-01-03 ENCOUNTER — Ambulatory Visit (HOSPITAL_COMMUNITY): Payer: Medicare Other

## 2019-01-05 ENCOUNTER — Ambulatory Visit (HOSPITAL_COMMUNITY): Payer: Medicare Other

## 2019-01-06 LAB — BASIC METABOLIC PANEL
BUN/Creatinine Ratio: 23 (ref 12–28)
BUN: 33 mg/dL — ABNORMAL HIGH (ref 8–27)
CO2: 22 mmol/L (ref 20–29)
Calcium: 9.8 mg/dL (ref 8.7–10.3)
Chloride: 106 mmol/L (ref 96–106)
Creatinine, Ser: 1.43 mg/dL — ABNORMAL HIGH (ref 0.57–1.00)
GFR calc Af Amer: 41 mL/min/{1.73_m2} — ABNORMAL LOW (ref >59)
GFR calc non Af Amer: 35 mL/min/{1.73_m2} — ABNORMAL LOW (ref >59)
Glucose: 99 mg/dL (ref 65–99)
Potassium: 4.8 mmol/L (ref 3.5–5.2)
Sodium: 142 mmol/L (ref 134–144)

## 2019-01-06 LAB — CBC WITH DIFFERENTIAL/PLATELET
Basophils Absolute: 0.1 10*3/uL (ref 0.0–0.2)
Basos: 1 %
EOS (ABSOLUTE): 0.3 10*3/uL (ref 0.0–0.4)
Eos: 4 %
Hematocrit: 29.8 % — ABNORMAL LOW (ref 34.0–46.6)
Hemoglobin: 9.8 g/dL — ABNORMAL LOW (ref 11.1–15.9)
Immature Grans (Abs): 0 10*3/uL (ref 0.0–0.1)
Immature Granulocytes: 0 %
Lymphocytes Absolute: 1.6 10*3/uL (ref 0.7–3.1)
Lymphs: 24 %
MCH: 30.5 pg (ref 26.6–33.0)
MCHC: 32.9 g/dL (ref 31.5–35.7)
MCV: 93 fL (ref 79–97)
Monocytes Absolute: 0.6 10*3/uL (ref 0.1–0.9)
Monocytes: 8 %
Neutrophils Absolute: 4.3 10*3/uL (ref 1.4–7.0)
Neutrophils: 63 %
Platelets: 161 10*3/uL (ref 150–450)
RBC: 3.21 x10E6/uL — ABNORMAL LOW (ref 3.77–5.28)
RDW: 12.1 % (ref 11.7–15.4)
WBC: 6.9 10*3/uL (ref 3.4–10.8)

## 2019-01-07 ENCOUNTER — Ambulatory Visit (HOSPITAL_COMMUNITY): Payer: Medicare Other

## 2019-01-07 ENCOUNTER — Telehealth: Payer: Self-pay

## 2019-01-07 ENCOUNTER — Encounter: Payer: Self-pay | Admitting: Cardiovascular Disease

## 2019-01-07 NOTE — Telephone Encounter (Signed)
Advised patient of labs   Notes recorded by Abelino Derrick, PA-C on 01/06/2019 at 4:38 PM EST Please let the patient know her labs look stable- no real change from prior. Moderate renal insufficiency and mild anemia.

## 2019-01-07 NOTE — Progress Notes (Signed)
Left voice message for the patient to callback for lab results.

## 2019-01-07 NOTE — Telephone Encounter (Signed)
LVM for patient to call back for lab results 

## 2019-01-07 NOTE — Telephone Encounter (Signed)
This encounter was created in error - please disregard.

## 2019-01-07 NOTE — Telephone Encounter (Signed)
Patient returning call regarding lab results  ?

## 2019-01-10 ENCOUNTER — Ambulatory Visit (HOSPITAL_COMMUNITY): Payer: Medicare Other

## 2019-01-12 ENCOUNTER — Ambulatory Visit (HOSPITAL_COMMUNITY): Payer: Medicare Other

## 2019-01-13 ENCOUNTER — Encounter: Payer: Self-pay | Admitting: Cardiovascular Disease

## 2019-01-13 ENCOUNTER — Ambulatory Visit: Payer: Medicare Other | Admitting: Cardiovascular Disease

## 2019-01-13 VITALS — BP 168/92 | HR 72 | Ht 65.0 in | Wt 198.2 lb

## 2019-01-13 DIAGNOSIS — I480 Paroxysmal atrial fibrillation: Secondary | ICD-10-CM

## 2019-01-13 DIAGNOSIS — I10 Essential (primary) hypertension: Secondary | ICD-10-CM | POA: Diagnosis not present

## 2019-01-13 DIAGNOSIS — I442 Atrioventricular block, complete: Secondary | ICD-10-CM | POA: Diagnosis not present

## 2019-01-13 MED ORDER — HYDRALAZINE HCL 25 MG PO TABS: 25.0000 mg | ORAL_TABLET | Freq: Three times a day (TID) | ORAL | 5 refills | Status: DC

## 2019-01-13 NOTE — Patient Instructions (Signed)
Medication Instructions:  INCREASE YOUR HYDRALAZINE TO 25 MG THREE TIMES A DAY  If you need a refill on your cardiac medications before your next appointment, please call your pharmacy.   Lab work: NONE  Testing/Procedures: NONE  Follow-Up: At [facility], you and your health needs are our priority.  As part of our continuing mission to provide you with exceptional heart care, we have created designated Provider Care Teams.  These Care Teams include your primary Cardiologist (physician) and Advanced Practice Providers (APPs -  Physician Assistants and Nurse Practitioners) who all work together to provide you with the care you need, when you need it. You will need a follow up appointment in 3 months.  Please call our office 2 months in advance to schedule this appointment.  You may see Chilton Si, MD or one of the following Advanced Practice Providers on your designated Care Team:   Corine Shelter, PA-C Judy Pimple, [place] . Marjie Skiff, PA-C  Your physician recommends that you schedule a follow-up appointment in: PHARM D 1 MONTH

## 2019-01-13 NOTE — Progress Notes (Signed)
Cardiology Office Note   Date:  01/13/2019   ID:  Lauren Henry, DOB 12/31/1940, MRN [id number]  PCP:  Gaspar Garbeisovec, Richard W, MD  Cardiologist:   Chilton Siiffany Canadian, MD   No chief complaint on file.    History of Present Illness: Lauren Henry is a 78 y.o. female with complete heart block s/p PPM, paroxysmal atrial fibrillation, and hypertension here for follow up.  Lauren Henry had a pacemaker implanted by Dr. Ladona Ridgelaylor in 2015 due to complete heart block.  She was minimally symptomatic and reports that it was incidentally found when she presented for knee surgery.  Since that time she has been doing well.  She continues to have difficult to manage hypertension.  She last followed up with Dr. Ladona Ridgelaylor on 10/2017 and her blood pressure was elevated.  Carvedilol was switched to twice daily dosing from once daily long-acting dosing. However she reports that she has been taking all her carvedilol in the AM.  Her BP has been mostly within range in the moring and increases throughout the day.  It is typically around 120 systolic in the AM.  She has been working with Dr. Wylene Simmerisovec and takes clonidine as needed for SBP >160 mmHg.  She has been taking this most days in the evening because her average BP has ranged from the 150s-180s.  She saw him 10/2017 and there was concern that anxiety may be contributing, as her AM BP is well controlled and it seems to increase throughout the day.  She was prescribed clonazepam but hasn't seen any improvement in her BP since starting this medication.  She notes that when her BP is very elevate she is unable to sleep.  She denies chest pain or shortness of breath.  Lauren Henry is very physically active.  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  She denies lower extremity edema, orthopnea or PND.    At her last appointment Lauren Henry had LE swelling and a rash that were thought to be due to amlodipine.  This was discontinued and clonidine was  increased.  She was also referred for an echo 09/13/18 that revealed LVEF 45-50% with diffuse hypokinesis and severe mitral annular calcification.  She was referred for a Lexiscan Myoview 10/07/18 that revealed a reversible defect in the inferior, apical and inferolateral regions.  This was associated with apical akinesis and inferoapical dyskinesis.  This was thought to be either ischemia or a pacemaker artifact.  Given that she was feeling well and has chronic kidney disease she elected medical management.  At her last appointment clonidine was increased due to poorly controlled blood pressure.  She was admitted with hypertensive urgency.  Her blood pressure was as high as the 240s at home.  Troponin was mildly elevated but cardiac catheterization that revealed mild to moderate non-obstructive CAD.  There was some concern about the proximal LAD.  However FFR was unremarkable.  Since leaving the hospital Lauren Henry has been feeling well.  She has not experienced any chest pain or shortness of breath.  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  She works for about 4 hours/day and has no exertional symptoms.  She denies lower extremity edema, orthopnea, or PND.  She has been checking her blood pressure at home and it has been mostly in the 120s to 130s.  It is occasionally in the 1 teens or 140s.  She is limiting her salt intake.  Past Medical History:  Diagnosis Date  . Arthritis   . Atrial fibrillation (HCC)   . Diarrhea    since gallbladder removed  . 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 History of blood transfusion as a child   no abnormal reaction noted  . History of colon polyps   . Hyperlipidemia    takes Pravastatin daily  . Hypertension    takes Azor and Metoprolol daily  . Hypothyroidism    takes Synthroid daily  . Joint pain   . Presence of permanent cardiac pacemaker   . Urinary frequency   . Urinary urgency     Past Surgical  History:  Procedure Laterality Date  . ABDOMINAL HYSTERECTOMY    . cataract surgery Bilateral   . CHOLECYSTECTOMY  1971  . COLONOSCOPY    . EYE SURGERY    . INSERT / REPLACE / REMOVE PACEMAKER     Medtronic PPM 11/01/14 (Dr. Lewayne Bunting)  . JOINT REPLACEMENT Right    knee  . LEFT HEART CATH AND CORONARY ANGIOGRAPHY N/A 11/01/2018   Procedure: LEFT HEART CATH AND CORONARY ANGIOGRAPHY;  Surgeon: Yvonne Kendall, MD;  Location: [facility] INVASIVE CV LAB;  Service: Cardiovascular;  Laterality: N/A;  . PERMANENT PACEMAKER INSERTION N/A 11/01/2014   Procedure: PERMANENT PACEMAKER INSERTION;  Surgeon: Marinus Maw, MD;  Location: [facility] CATH LAB;  Service: Cardiovascular;  Laterality: N/A;  . stomach stapled  1985  . TOTAL KNEE ARTHROPLASTY Left 11/05/2015   Procedure: TOTAL KNEE ARTHROPLASTY;  Surgeon: Dannielle Huh, MD;  Location: [facility] OR;  Service: Orthopedics;  Laterality: Left;     Current Outpatient Medications  Medication Sig Dispense Refill  . acetaminophen (TYLENOL) 500 MG tablet Take 1,000 mg by mouth at bedtime as needed (pain). For leg pain    . apixaban (ELIQUIS) 5 MG TABS tablet Take 1 tablet (5 mg total) by mouth 2 (two) times daily. 180 tablet 1  . carvedilol (COREG CR) 40 MG 24 hr capsule Take 40 mg by mouth daily.    . cloNIDine (CATAPRES) 0.2 MG tablet Take 0.2 mg by mouth 3 (three) times daily.    . Ferrous Sulfate (IRON) 142 (45 Fe) MG TBCR Take by mouth.    . hydrALAZINE (APRESOLINE) 25 MG tablet Take 1 tablet (25 mg total) by mouth 3 (three) times daily. 90 tablet 5  . levothyroxine (SYNTHROID, LEVOTHROID) 150 MCG tablet Take 150 mcg by mouth. Take 1 tablet by mouth and take an extra 1/2 tablet on two days  0  . mirtazapine (REMERON) 7.5 MG tablet TAKE 1 TABLET BY MOUTH AT BEDTIME FOR DEPRESSION/SLEEP    . Multiple Vitamin (MULTIVITAMIN WITH MINERALS) TABS tablet Take 1 tablet by mouth daily.    Marland Kitchen olmesartan (BENICAR) 40 MG tablet Take 1 tablet (40 mg total) by mouth daily. 90  tablet 3  . pantoprazole (PROTONIX) 40 MG tablet Take 40 mg by mouth daily.  3  . spironolactone (ALDACTONE) 25 MG tablet Take 25 mg by mouth daily.   0  . rosuvastatin (CRESTOR) 40 MG tablet Take 1 tablet (40 mg total) by mouth daily. 90 tablet 3   No current facility-administered medications for this visit.     Allergies:   Amlodipine    Social History:  The patient  reports that she has never smoked. She has never used smokeless tobacco. She reports current alcohol use of about 1.0 standard drinks of alcohol per week. She  reports that she does not use drugs.   Family History:  The patient's family history includes Heart attack in her father; Heart disease in her father; Stroke in her sister.    ROS:  Please see the history of present illness.   Otherwise, review of systems are positive for burning in feet.   All other systems are reviewed and negative.    PHYSICAL EXAM: VS:  BP (!) 168/92   Pulse 72   Ht 5\' 5"  (1.651 m)   Wt 198 lb 3.2 oz (89.9 kg)   BMI 32.98 kg/m  , BMI Body mass index is 32.98 kg/m. GENERAL:  Well appearing HEENT: Pupils equal round and reactive, fundi not visualized, oral mucosa unremarkable NECK:  No jugular venous distention, waveform within normal limits, carotid upstroke brisk and symmetric, no bruits LUNGS:  Clear to auscultation bilaterally HEART:  RRR.  PMI not displaced or sustained,S1 and S2 within normal limits, no S3, no S4, no clicks, no rubs, no murmurs ABD:  Flat, positive bowel sounds normal in frequency in pitch, no bruits, no rebound, no guarding, no midline pulsatile mass, no hepatomegaly, no splenomegaly EXT:  2 plus pulses throughout, no edema, no cyanosis no clubbing SKIN:  No rashes no nodules NEURO:  Cranial nerves II through XII grossly intact, motor grossly intact throughout PSYCH:  Cognitively intact, oriented to person place and time   EKG:  EKG is not ordered today. 09/09/18:  ASVP.  Rate 69 bpm.  Echo 10/30/14: Study  Conclusions  - Left ventricle: The cavity size was normal. There was mild focal basal hypertrophy of the septum. Systolic function was normal. The estimated ejection fraction was in the range of 60% to 65%. Wall motion was normal; there were no regional wall motion abnormalities. - Aortic valve: Trileaflet; mildly thickened, mildly calcified leaflets. Transvalvular velocity was minimally increased. There was no stenosis. - Mitral valve: Calcified annulus. There was mild regurgitation. - Right ventricle: The cavity size was mildly dilated. Wall thickness was normal.  Echo 09/13/18: Study Conclusions  - Left ventricle: The cavity size was normal. There was mild focal   basal hypertrophy of the septum. Systolic function was mildly   reduced. The estimated ejection fraction was in the range of 45%   to 50%. Diffuse hypokinesis. Doppler parameters are consistent   with both elevated ventricular end-diastolic filling pressure and   elevated left atrial filling pressure. - Mitral valve: Severely calcified annulus. Mildly thickened,   mildly calcified leaflets . - Left atrium: The atrium was mildly dilated. - Atrial septum: No defect or patent foramen ovale was identified.  Lexiscan Myoview 10/07/18:   The left ventricular ejection fraction is mildly decreased (45-54%).  Nuclear stress EF: 54%.  No T wave inversion was noted during stress.  There was no ST segment deviation noted during stress.  Defect 1: There is a large defect of moderate severity.   Large size, moderate severity partially reversible inferior, apical and inferolateral perfusion defect, suggestive of ischemia. This could also represent pacemaker-related artifact. There is apical akinesis and inferoapical dyskinesis. LVEF 54%. This is an intermediate risk study. Clinical correlation is advised.  LHC 10/2018: Conclusions: 1. Mild to moderate, non-obstructive CAD. Most severe lesion is a 50-60%  stenosis in the proximal LCx that is not hemodynamically significant (DFR 1.0).  I suspect mild troponin elevation reflects supply-demand mismatch in the setting of uncontrolled hypertension and renal insufficiency. 2. Normal left ventricular filling pressure.  Recommendations: 1. Medical therapy and risk factor modification  to prevent progression of disease. 2. Consider noninvasive evaluation for renal artery stenosis (if not already performed as an outpatient), given hypertension and renal insufficiency.  Recent Labs: 10/30/2018: Magnesium 1.8; TSH 4.554 10/31/2018: ALT 17 01/05/2019: BUN 33; Creatinine, Ser 1.43; Hemoglobin 9.8; Platelets 161; Potassium 4.8; Sodium 142    Lipid Panel    Component Value Date/Time   CHOL 122 10/30/2018 1852   TRIG 51 10/30/2018 1852   HDL 52 10/30/2018 1852   CHOLHDL 2.3 10/30/2018 1852   VLDL 10 10/30/2018 1852   LDLCALC 60 10/30/2018 1852     01/21/18: Sodium 140, BUN 27, creatinine 1.2 AST TSH 5.03 07/15/17: Total cholesterol 169, triglycerides 89, HDL 52, LDL 99  Wt Readings from Last 3 Encounters:  01/13/19 198 lb 3.2 oz (89.9 kg)  11/24/18 189 lb (85.7 kg)  11/11/18 190 lb 3.2 oz (86.3 kg)      ASSESSMENT AND PLAN:  # PAF: Currently in sinus rhythm.  Continue Eliquis and carvedilol.   # Hypertension: Blood pressure remains elevated.  Her blood pressure on her machine was 171/93.  On repeat when I checked it was 190/88.  This makes me concerned that her blood pressure may be actually higher than her home machine is recording.  Her blood pressure is mostly controlled at home.Marland Kitchen.  However I worry that the 120s max to be 140s at her 130s may be 150s.  We will make a small change by increasing hydralazine to 25 mg 3 times daily.  I have asked her to continue tracking at home and she will also go have a check to her local fire station.  I am suspicious that we may need to increase the hydralazine further.  #Non-obstructive CAD: #  Hyperlipidemia:   Left heart cath 10/2018 showed nonobstructive disease.  LDL was 60 on 10/2018.  She is not on aspirin given that she takes Eliquis.  Continue rosuvastatin.  # CHB s/p PPM: Currently ASVP.  Managed by Dr. Ladona Ridgelaylor.    # Rash: # LE Edema: Resolved after stopping amlodipine.   Current medicines are reviewed at length with the patient today.  The patient does not have concerns regarding medicines.  The following changes have been made: Increase hydralazine.  Labs/ tests ordered today include:   No orders of the defined types were placed in this encounter.    Disposition:   FU with Ming Mcmannis C. Duke Salviaandolph, MD, [facility] in 3 months.  PharmD in 1 month.       Signed, Indie Boehne C. Duke Salviaandolph, MD, [facility]FACC  01/13/2019 1:19 PM    [facility]

## 2019-01-14 ENCOUNTER — Ambulatory Visit (HOSPITAL_COMMUNITY): Payer: Medicare Other

## 2019-01-15 LAB — CUP PACEART INCLINIC DEVICE CHECK
Battery Impedance: 209 Ohm
Battery Remaining Longevity: 112 mo
Battery Voltage: 2.79 V
Brady Statistic AP VP Percent: 39 %
Brady Statistic AS VP Percent: 61 %
Brady Statistic AS VS Percent: 0 %
Date Time Interrogation Session: 20191121192644
Implantable Lead Implant Date: 20151111
Implantable Lead Location: 753859
Implantable Lead Location: 753860
Implantable Lead Model: 5076
Implantable Lead Model: 5076
Implantable Pulse Generator Implant Date: 20151111
Lead Channel Impedance Value: 430 Ohm
Lead Channel Impedance Value: 520 Ohm
Lead Channel Pacing Threshold Amplitude: 0.75 V
Lead Channel Pacing Threshold Pulse Width: 0.4 ms
Lead Channel Pacing Threshold Pulse Width: 0.4 ms
Lead Channel Sensing Intrinsic Amplitude: 2 mV
Lead Channel Setting Pacing Amplitude: 1.5 V
Lead Channel Setting Pacing Amplitude: 2.5 V
Lead Channel Setting Pacing Pulse Width: 0.4 ms
Lead Channel Setting Sensing Sensitivity: 4 mV
MDC IDC LEAD IMPLANT DT: 20151111
MDC IDC MSMT LEADCHNL RV PACING THRESHOLD AMPLITUDE: 1.25 V
MDC IDC STAT BRADY AP VS PERCENT: 0 %

## 2019-01-17 ENCOUNTER — Ambulatory Visit (HOSPITAL_COMMUNITY): Payer: Medicare Other

## 2019-01-19 ENCOUNTER — Ambulatory Visit (HOSPITAL_COMMUNITY): Payer: Medicare Other

## 2019-01-21 ENCOUNTER — Ambulatory Visit (HOSPITAL_COMMUNITY): Payer: Medicare Other

## 2019-01-24 ENCOUNTER — Telehealth: Payer: Self-pay | Admitting: Cardiovascular Disease

## 2019-01-24 ENCOUNTER — Ambulatory Visit (HOSPITAL_COMMUNITY): Payer: Medicare Other

## 2019-01-24 NOTE — Telephone Encounter (Signed)
Returned pt call.[email or web address] 

## 2019-01-24 NOTE — Telephone Encounter (Signed)
New Message   Pt c/o BP issue:  1. What are your last 5 BP readings? 169/80 2. Are you having any other symptoms (ex. Dizziness, headache, blurred vision, passed out)? No symptoms 3. What is your medication issue? None  Patient is calling because she was told to call and give her BP reading this morning. Please call to discuss.

## 2019-01-26 ENCOUNTER — Ambulatory Visit (HOSPITAL_COMMUNITY): Payer: Medicare Other

## 2019-01-27 NOTE — Telephone Encounter (Signed)
Need more than one reading to make a determination, LMOM for patient to return call

## 2019-01-28 ENCOUNTER — Ambulatory Visit (HOSPITAL_COMMUNITY): Payer: Medicare Other

## 2019-01-28 NOTE — Telephone Encounter (Signed)
Reviewed meds and BP readings.  Home readings mostly 160-180 systolic.  Diastolic appears to be WNL.    Current meds:  Spironolactone 25 mg qd  olmesartan 40 mg qd  Carvedilol 40 mg qd  Hydralazine 25 mg tid  Clonidine .02 mg tid  States good with taking tid meds, has routine and rarely misses.    Will have her increase hydralazine to 50 mg tid and moved CVRR appt up to Feb 20 at 9am.    Patient voiced understanding.

## 2019-01-31 ENCOUNTER — Ambulatory Visit (HOSPITAL_COMMUNITY): Payer: Medicare Other

## 2019-02-02 ENCOUNTER — Ambulatory Visit (INDEPENDENT_AMBULATORY_CARE_PROVIDER_SITE_OTHER): Payer: Medicare Other

## 2019-02-02 ENCOUNTER — Ambulatory Visit (HOSPITAL_COMMUNITY): Payer: Medicare Other

## 2019-02-02 DIAGNOSIS — I442 Atrioventricular block, complete: Secondary | ICD-10-CM

## 2019-02-02 LAB — CUP PACEART REMOTE DEVICE CHECK
Battery Remaining Longevity: 111 mo
Battery Voltage: 2.79 V
Brady Statistic AP VP Percent: 43 %
Brady Statistic AP VS Percent: 0 %
Brady Statistic AS VP Percent: 57 %
Brady Statistic AS VS Percent: 1 %
Implantable Lead Implant Date: 20151111
Implantable Lead Implant Date: 20151111
Implantable Lead Location: 753860
Implantable Lead Model: 5076
Implantable Lead Model: 5076
Implantable Pulse Generator Implant Date: 20151111
Lead Channel Impedance Value: 442 Ohm
Lead Channel Impedance Value: 580 Ohm
Lead Channel Pacing Threshold Amplitude: 0.625 V
Lead Channel Pacing Threshold Amplitude: 1.5 V
Lead Channel Pacing Threshold Pulse Width: 0.4 ms
Lead Channel Pacing Threshold Pulse Width: 0.4 ms
Lead Channel Setting Pacing Amplitude: 1.5 V
Lead Channel Setting Pacing Amplitude: 2.5 V
Lead Channel Setting Pacing Pulse Width: 0.4 ms
Lead Channel Setting Sensing Sensitivity: 2 mV
MDC IDC LEAD LOCATION: 753859
MDC IDC MSMT BATTERY IMPEDANCE: 234 Ohm
MDC IDC SESS DTM: 20200212124801

## 2019-02-02 NOTE — Telephone Encounter (Signed)
Agree with Kristin's plan to increase hydralazine.

## 2019-02-04 ENCOUNTER — Ambulatory Visit (HOSPITAL_COMMUNITY): Payer: Medicare Other

## 2019-02-07 ENCOUNTER — Other Ambulatory Visit: Payer: Self-pay | Admitting: *Deleted

## 2019-02-07 ENCOUNTER — Ambulatory Visit (HOSPITAL_COMMUNITY): Payer: Medicare Other

## 2019-02-07 DIAGNOSIS — I480 Paroxysmal atrial fibrillation: Secondary | ICD-10-CM

## 2019-02-07 MED ORDER — APIXABAN 5 MG PO TABS: 5.0000 mg | ORAL_TABLET | Freq: Two times a day (BID) | ORAL | 3 refills | Status: DC

## 2019-02-07 NOTE — Telephone Encounter (Signed)
Pt is a 78 yr old female who last saw Dr. Duke Salvia on 01/13/19, weight at that visit was 89.9Kg. SCr on 01/05/19 was 1.43. Will refill Eliquis 5mg  BID.

## 2019-02-09 ENCOUNTER — Ambulatory Visit (HOSPITAL_COMMUNITY): Payer: Medicare Other

## 2019-02-10 ENCOUNTER — Ambulatory Visit (INDEPENDENT_AMBULATORY_CARE_PROVIDER_SITE_OTHER): Payer: Medicare Other | Admitting: Pharmacist

## 2019-02-10 VITALS — BP 128/56 | HR 78 | Resp 16 | Ht 65.0 in | Wt 201.8 lb

## 2019-02-10 DIAGNOSIS — I10 Essential (primary) hypertension: Secondary | ICD-10-CM

## 2019-02-10 MED ORDER — HYDRALAZINE HCL 25 MG PO TABS: 50.0000 mg | ORAL_TABLET | Freq: Three times a day (TID) | ORAL | 1 refills | Status: DC

## 2019-02-10 NOTE — Progress Notes (Signed)
Patient ID: Lauren Henry                 DOB: [birth year]                      MRN: [id number]     HPI: Lauren Henry is a 78 y.o. female referred by Dr. Duke Salvia to HTN clinic. PMH includes Afib, hyperlipidemia, uncontrolled hypertension, and complete heart block. Patient presents to hypertension clinic for initial visit and medication titration. Reports compliance with all medication and denies dizziness, swelling, headaches or blurry vision. Complains of "jittery" feeling shortly after supper but no there issues.  Current HTN meds:  Carvedilol CR 40mg  daily  - lunch time Clonidine 0.2mg  TID (am, lunch,hs) 9am-1pm- 10pm Hydralazine 50mg  TID (am, lunch,hs) 9am-1pm-10pm Olmesartan 40mg  daily (morning) 9am Spironolactone 25mg  daily(morning) 9am  Previously tried: amlodipine - swelling and rash  BP goal: 130/80  Family History: The patient's family history includes Heart attack in her father; Heart disease in her father; Stroke in her sister.  Social History: The patient  reports that she has never smoked. She has never used smokeless tobacco. She reports current alcohol use of about 1.0 standard drinks of alcohol per week. She reports that she does not use drugs.   Diet: home cooked meals, work on low sodium and avoid adding salt to cooked food.   Exercise: activities of daily living  Home BP readings: home BP device (arm cuff-Omron) accurate with proper technique 7 readings (all in AM before medication administration); average 145/70  Wt Readings from Last 3 Encounters:  02/10/19 201 lb 12.8 oz (91.5 kg)  01/13/19 198 lb 3.2 oz (89.9 kg)  11/24/18 189 lb (85.7 kg)   BP Readings from Last 3 Encounters:  02/10/19 (!) 128/56  01/13/19 (!) 168/92  11/24/18 132/78   Pulse Readings from Last 3 Encounters:  02/10/19 78  01/13/19 72  11/24/18 60    Past Medical History:  Diagnosis Date  . Arthritis   . Atrial fibrillation (HCC)   . Diarrhea    since gallbladder removed  .  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 History of blood transfusion as a child   no abnormal reaction noted  . History of colon polyps   . Hyperlipidemia    takes Pravastatin daily  . Hypertension    takes Azor and Metoprolol daily  . Hypothyroidism    takes Synthroid daily  . Joint pain   . Presence of permanent cardiac pacemaker   . Urinary frequency   . Urinary urgency     Current Outpatient Medications on File Prior to Visit  Medication Sig Dispense Refill  . acetaminophen (TYLENOL) 500 MG tablet Take 1,000 mg by mouth at bedtime as needed (pain). For leg pain    . apixaban (ELIQUIS) 5 MG TABS tablet Take 1 tablet (5 mg total) by mouth 2 (two) times daily. 180 tablet 3  . carvedilol (COREG CR) 40 MG 24 hr capsule Take 40 mg by mouth daily.    . cloNIDine (CATAPRES) 0.2 MG tablet Take 0.2 mg by mouth 3 (three) times daily.    . Ferrous Sulfate (IRON) 142 (45 Fe) MG TBCR Take by mouth.    . levothyroxine (SYNTHROID, LEVOTHROID) 150 MCG tablet Take 150 mcg by mouth. Take 1 tablet by mouth and take an extra 1/2 tablet on two days  0  . mirtazapine (REMERON) 7.5 MG  tablet TAKE 1 TABLET BY MOUTH AT BEDTIME FOR DEPRESSION/SLEEP    . Multiple Vitamin (MULTIVITAMIN WITH MINERALS) TABS tablet Take 1 tablet by mouth daily.    Marland Kitchen olmesartan (BENICAR) 40 MG tablet Take 1 tablet (40 mg total) by mouth daily. 90 tablet 3  . pantoprazole (PROTONIX) 40 MG tablet Take 40 mg by mouth daily.  3  . rosuvastatin (CRESTOR) 40 MG tablet Take 1 tablet (40 mg total) by mouth daily. 90 tablet 3  . spironolactone (ALDACTONE) 25 MG tablet Take 25 mg by mouth daily.   0   No current facility-administered medications on file prior to visit.     Allergies  Allergen Reactions  . Amlodipine Swelling and Rash    Blood pressure (!) 128/56, pulse 78, resp. rate 16, height 5\' 5"  (1.651 m), weight 201 lb 12.8 oz (91.5 kg), SpO2 98 %.  Hypertension Blood  pressure at goal during office visit, but home BP average remains at 145/70. All home readings provided are in the morning before medication administration. No readings were done after immediate release clonidine and hydralazine taken. I suspect her "jittery" feeling every afternoon may be related to  BP drop after 2nd dose of hydralazine and clonidine in top of long acting medication.  Will continue all medication without changes , monitor BP twice daily, follow up by phone in 2 weeks to determine if afternoon readings are low or not, follow up in clinic in 5 weeks.  Duante Arocho Rodriguez-Guzman PharmD, BCPS, CPP [facility] [address] [place] [address] 02/10/2019 10:53 AM

## 2019-02-10 NOTE — Patient Instructions (Signed)
Return for a  follow up appointment in 4 weeks (2 weeks by phone)  Check your blood pressure at home daily (if able) and keep record of the readings.  Take your BP meds as follows: *NO medication change*  *MONITOR blood pressure twice daily*  Bring all of your meds, your BP cuff and your record of home blood pressures to your next appointment.  Exercise as you're able, try to walk approximately 30 minutes per day.  Keep salt intake to a minimum, especially watch canned and prepared boxed foods.  Eat more fresh fruits and vegetables and fewer canned items.  Avoid eating in fast food restaurants.    HOW TO TAKE YOUR BLOOD PRESSURE: . Rest 5 minutes before taking your blood pressure. .  Don't smoke or drink caffeinated beverages for at least 30 minutes before. . Take your blood pressure before (not after) you eat. . Sit comfortably with your back supported and both feet on the floor (don't cross your legs). . Elevate your arm to heart level on a table or a desk. . Use the proper sized cuff. It should fit smoothly and snugly around your bare upper arm. There should be enough room to slip a fingertip under the cuff. The bottom edge of the cuff should be 1 inch above the crease of the elbow. . Ideally, take 3 measurements at one sitting and record the average.

## 2019-02-10 NOTE — Assessment & Plan Note (Signed)
Blood pressure at goal during office visit, but home BP average remains at 145/70. All home readings provided are in the morning before medication administration. No readings were done after immediate release clonidine and hydralazine taken. I suspect her "jittery" feeling every afternoon may be related to  BP drop after 2nd dose of hydralazine and clonidine in top of long acting medication.  Will continue all medication without changes , monitor BP twice daily, follow up by phone in 2 weeks to determine if afternoon readings are low or not, follow up in clinic in 5 weeks.

## 2019-02-11 ENCOUNTER — Ambulatory Visit (HOSPITAL_COMMUNITY): Payer: Medicare Other

## 2019-02-14 ENCOUNTER — Ambulatory Visit (HOSPITAL_COMMUNITY): Payer: Medicare Other

## 2019-02-14 NOTE — Progress Notes (Signed)
Remote pacemaker transmission.   

## 2019-02-14 NOTE — Telephone Encounter (Signed)
Cardiac Rehab - Pharmacy Resident Documentation   Patient unable to be reached after three call attempts. Please complete allergy verification and medication review during patient's cardiac rehab appointment.     

## 2019-02-15 ENCOUNTER — Ambulatory Visit: Payer: Medicare Other

## 2019-02-16 ENCOUNTER — Ambulatory Visit (HOSPITAL_COMMUNITY): Payer: Medicare Other

## 2019-02-18 ENCOUNTER — Ambulatory Visit (HOSPITAL_COMMUNITY): Payer: Medicare Other

## 2019-02-21 ENCOUNTER — Ambulatory Visit (HOSPITAL_COMMUNITY): Payer: Medicare Other

## 2019-02-22 ENCOUNTER — Telehealth: Payer: Self-pay | Admitting: Pharmacist

## 2019-02-22 NOTE — Telephone Encounter (Signed)
Patient emailed BP reading for last 10 days. No dizziness, swelling, HA or blurry vision.  Average morning readings (before medication): 148/72  Average evening readings: 143/68   Recommendation:  1. Continue current regimen without changes  2. Continue to monitor BP twice daily (morning 1hr after medication & evening)  3. Will re-assess in 10-14 days

## 2019-02-23 ENCOUNTER — Telehealth: Payer: Self-pay

## 2019-02-23 ENCOUNTER — Ambulatory Visit (HOSPITAL_COMMUNITY): Payer: Medicare Other

## 2019-02-23 NOTE — Telephone Encounter (Signed)
Drastic drop in bp @ 10:20 93/51 this am is 110/57 @ 9:30 pt is concerned and feels "woozy" changed times of when some of the meds are taken

## 2019-02-23 NOTE — Telephone Encounter (Signed)
Spoke with patient - today is the first day her BP has dropped low and been symptomatic.  Advised her to hold noon dose of clonidine but otherwise continue all medications.  She should continue with daily home BP checks and call if pressure continues to drop to symptomatic levels.  Also advised she drink more fluids today.    Patient voiced understanding.

## 2019-02-24 ENCOUNTER — Telehealth: Payer: Self-pay | Admitting: Pharmacist Clinician (PhC)/ Clinical Pharmacy Specialist

## 2019-02-24 NOTE — Telephone Encounter (Signed)
Patient called again to report feeling dizzy/loopy this am after taking meds. States BP 1 hour after meds was 90/50.  She wonders if taking 2 clonidine tabs tid is too much.  Reviewed chart.  Not sure how she got to be taking 2 tabs tid, as she has the 0.2 mg tabs.  She states she increased the dose herself, but I'm not sure how long ago.  Advised she skip her noon dose, stay hydrated and eat some crackers or chips this am, and resume tonight with just 1 tablet tid.    Patient voiced understanding

## 2019-02-25 ENCOUNTER — Ambulatory Visit (HOSPITAL_COMMUNITY): Payer: Medicare Other

## 2019-02-28 ENCOUNTER — Ambulatory Visit (HOSPITAL_COMMUNITY): Payer: Medicare Other

## 2019-03-01 ENCOUNTER — Telehealth: Payer: Self-pay

## 2019-03-01 NOTE — Telephone Encounter (Signed)
Returned call, patient notes that home BP has been better for the past few days, but low again at 84/42 this am.  Had her repeat while on the phone, new reading at 188 systolic.     Will have her decrease clonidine 0.2 mg to just bid for now and have her continue to watch home readings no more than 3 times per day unless symptomatic.  We will see her in CVRR next Thursday

## 2019-03-01 NOTE — Telephone Encounter (Signed)
Pt called stating their bp is 84/42 this morning pt is concerned and would like a callback from kristin alvstad

## 2019-03-02 ENCOUNTER — Ambulatory Visit (HOSPITAL_COMMUNITY): Payer: Medicare Other

## 2019-03-04 ENCOUNTER — Ambulatory Visit (HOSPITAL_COMMUNITY): Payer: Medicare Other

## 2019-03-07 ENCOUNTER — Ambulatory Visit (HOSPITAL_COMMUNITY): Payer: Medicare Other

## 2019-03-09 ENCOUNTER — Ambulatory Visit (HOSPITAL_COMMUNITY): Payer: Medicare Other

## 2019-03-10 ENCOUNTER — Ambulatory Visit (INDEPENDENT_AMBULATORY_CARE_PROVIDER_SITE_OTHER): Payer: Medicare Other | Admitting: Pharmacist Clinician (PhC)/ Clinical Pharmacy Specialist

## 2019-03-10 ENCOUNTER — Other Ambulatory Visit: Payer: Self-pay

## 2019-03-10 DIAGNOSIS — I10 Essential (primary) hypertension: Secondary | ICD-10-CM

## 2019-03-10 MED ORDER — HYDRALAZINE HCL 50 MG PO TABS: 50.0000 mg | ORAL_TABLET | Freq: Three times a day (TID) | ORAL | 3 refills | Status: DC

## 2019-03-10 NOTE — Progress Notes (Signed)
Patient ID: Lauren Henry                 DOB: [birth year]                      MRN: [id number]     HPI: Lauren Henry is a 78 y.o. female referred by Dr. Duke Salvia to HTN clinic. In addition to hypertension her medical history is significant for Afib, hyperlipidemia, uncontrolled hypertension, and complete heart block.  Se was seen in [facility] last month for initial medication evaluation and titration.  At that time she reported compliance with her medications and denied any problems except for some "jittery" feelings after supper.  At that visit there were no medication changes, but patient was asked to watch her BP at home more closely and let us know what her readings were.  She called on March 3 to report feeling well with average BP of 143/68, however the next day called because of a symptomatic hypotensive episode.  Because it appeared to be an isolated incident, she was asked to continue with monitoring.  Hypotensive episode occurred again the next day, and in reviewing her medications discovered she was taking clonidine 0.4 mg tid, while our chart indicated 0.2 mg tid.  I had her cut the dose to .0.2 mg tid and several days later she reported still having some hypotensive episodes, but fewer than previously.    Today she is in the office for follow up.  She has noted in the past several days her hypotensive episodes tend to occur in the mornings, about an hour or so after taking meds.  In general she is seeing higher readings in the evenings and lower in the mornings.    Current HTN meds:  Carvedilol CR 40mg  daily  - lunch time Clonidine 0.2mg  TID (am, lunch,hs) 9am-1pm- 10pm - was actually taking 0.4 mg bid ,cut back on 3/5 Hydralazine 50mg  TID (am, lunch,hs) 9am-1pm-10pm  -  Olmesartan 40mg  daily (morning) 9am Spironolactone 25mg  daily(morning) 9am  Previously tried: amlodipine - swelling and rash  BP goal: 130/80  Family History: The patient's family history includes Heart attack in her  father; Heart disease in her father; Stroke in her sister.  Social History: The patient  reports that she has never smoked. She has never used smokeless tobacco. She reports current alcohol use of about 1.0 standard drinks of alcohol per week. She reports that she does not use drugs.   Diet: home cooked meals, work on low sodium and avoid adding salt to cooked food.   Exercise: activities of daily living    Home BP readings: has been checking twice daily for past 2-3 weeks, for past 7 days the morning average was 117/ 55, while evenings was 152/70.   She is noting the occasional systolic reading < 100 in the mornings.     Wt Readings from Last 3 Encounters:  02/10/19 201 lb 12.8 oz (91.5 kg)  01/13/19 198 lb 3.2 oz (89.9 kg)  11/24/18 189 lb (85.7 kg)   BP Readings from Last 3 Encounters:  03/10/19 122/66  02/10/19 (!) 128/56  01/13/19 (!) 168/92   Pulse Readings from Last 3 Encounters:  03/10/19 64  02/10/19 78  01/13/19 72    Past Medical History:  Diagnosis Date  . Arthritis   . Atrial fibrillation (HCC)   . Diarrhea    since gallbladder removed  . 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 History of blood transfusion as a child   no abnormal reaction noted  . History of colon polyps   . Hyperlipidemia    takes Pravastatin daily  . Hypertension    takes Azor and Metoprolol daily  . Hypothyroidism    takes Synthroid daily  . Joint pain   . Presence of permanent cardiac pacemaker   . Urinary frequency   . Urinary urgency     Current Outpatient Medications on File Prior to Visit  Medication Sig Dispense Refill  . apixaban (ELIQUIS) 5 MG TABS tablet Take 1 tablet (5 mg total) by mouth 2 (two) times daily. 180 tablet 3  . carvedilol (COREG CR) 40 MG 24 hr capsule Take 40 mg by mouth daily.    . cloNIDine (CATAPRES) 0.2 MG tablet Take 0.2 mg by mouth 3 (three) times daily.    . Ferrous Sulfate (IRON) 142 (45 Fe) MG  TBCR Take by mouth.    . levothyroxine (SYNTHROID, LEVOTHROID) 150 MCG tablet Take 150 mcg by mouth. Take 1 tablet by mouth and take an extra 1/2 tablet on two days  0  . mirtazapine (REMERON) 7.5 MG tablet TAKE 1 TABLET BY MOUTH AT BEDTIME FOR DEPRESSION/SLEEP    . Multiple Vitamin (MULTIVITAMIN WITH MINERALS) TABS tablet Take 1 tablet by mouth daily.    Marland Kitchen olmesartan (BENICAR) 40 MG tablet Take 1 tablet (40 mg total) by mouth daily. 90 tablet 3  . pantoprazole (PROTONIX) 40 MG tablet Take 40 mg by mouth daily.  3  . rosuvastatin (CRESTOR) 40 MG tablet Take 1 tablet (40 mg total) by mouth daily. 90 tablet 3  . spironolactone (ALDACTONE) 25 MG tablet Take 25 mg by mouth daily.   0   No current facility-administered medications on file prior to visit.     Allergies  Allergen Reactions  . Amlodipine Swelling and Rash    Blood pressure 122/66, pulse 64.  Hypertension Have asked patient to move her spironolactone to noon rather than morning to decrease risk of morning hypotension.  She will also hold the clonidine whenever her BP is < 120 systolic.  I have asked that she continue with home BP checks 2-3 times daily and reach out to [place] in 2-3 weeks via MyChart to let us know how her BP is doing.  Hopefully we can wean her down on the clonidine to 0.1 mg tablets in the next month or so and use them for only elevated readings.    Phillips Hay PharmD CPP [facility] [address] [place] [address] 03/15/2019 2:40 PM

## 2019-03-10 NOTE — Patient Instructions (Addendum)
Send [place] a MyChart message in 2-3 weeks and let us know how your blood pressure is doing.  Your blood pressure today is 122/66    Check your blood pressure at home three times each day (before med times), and keep record of the readings.   Every 7 days add the numbers and divide by 7 to get a weekly average.   Take your BP meds as follows:  AM:  Olmesartan, hydralazine, clonidine  LUNCH:  Carvedilol, spironolactone, hydralazine, clonidine  PM:  Hydralazine, clonidine  SKIP YOUR DOSE OF CLONIDINE IF YOUR BLOOD PRESSURE IS <120 SYSTOLIC  Bring all of your meds, your BP cuff and your record of home blood pressures to your next appointment.  Exercise as you're able, try to walk approximately 30 minutes per day.  Keep salt intake to a minimum, especially watch canned and prepared boxed foods.  Eat more fresh fruits and vegetables and fewer canned items.  Avoid eating in fast food restaurants.    HOW TO TAKE YOUR BLOOD PRESSURE: . Rest 5 minutes before taking your blood pressure. .  Don't smoke or drink caffeinated beverages for at least 30 minutes before. . Take your blood pressure before (not after) you eat. . Sit comfortably with your back supported and both feet on the floor (don't cross your legs). . Elevate your arm to heart level on a table or a desk. . Use the proper sized cuff. It should fit smoothly and snugly around your bare upper arm. There should be enough room to slip a fingertip under the cuff. The bottom edge of the cuff should be 1 inch above the crease of the elbow. . Ideally, take 3 measurements at one sitting and record the average.

## 2019-03-11 ENCOUNTER — Ambulatory Visit (HOSPITAL_COMMUNITY): Payer: Medicare Other

## 2019-03-11 NOTE — Assessment & Plan Note (Signed)
Have asked patient to move her spironolactone to noon rather than morning to decrease risk of morning hypotension.  She will also hold the clonidine whenever her BP is < 120 systolic.  I have asked that she continue with home BP checks 2-3 times daily and reach out to [place] in 2-3 weeks via MyChart to let us know how her BP is doing.  Hopefully we can wean her down on the clonidine to 0.1 mg tablets in the next month or so and use them for only elevated readings.

## 2019-03-14 ENCOUNTER — Ambulatory Visit (HOSPITAL_COMMUNITY): Payer: Medicare Other

## 2019-03-15 NOTE — Assessment & Plan Note (Deleted)
Patient with essential hypertension, doing well with morning readings, but developing hypertension as the day goes on.   For now I have asked her to move the spironolactone from mornings to noon for better evening control, and hold doses of clonidine if her systolic pressure is < 120.  Because of the COVID-19 outbreak, I have asked her to send [place] her readings via MyChart in 2-3 weeks.

## 2019-03-16 ENCOUNTER — Ambulatory Visit (HOSPITAL_COMMUNITY): Payer: Medicare Other

## 2019-03-28 ENCOUNTER — Encounter: Payer: Self-pay | Admitting: *Deleted

## 2019-03-28 ENCOUNTER — Telehealth: Payer: Self-pay

## 2019-03-28 NOTE — Telephone Encounter (Signed)
TELEPHONE CALL NOTE  Lauren Henry has been deemed a candidate for a follow-up tele-health visit to limit community exposure during the Covid-19 pandemic. I spoke with the patient via phone to ensure availability of phone/video source, confirm preferred email & phone number, and discuss instructions and expectations.  I reminded Lauren Henry to be prepared with any vital sign and/or heart rhythm information that could potentially be obtained via home monitoring, at the time of her visit. I reminded Lauren Henry to expect a phone call at the time of her visit if her visit.  Did the patient verbally acknowledge consent to treatment? Patient provided verbal consent.  Garey Ham, CMA 03/28/2019 3:40 PM   DOWNLOADING THE WEBEX SOFTWARE TO SMARTPHONE  - If Apple, go to [employer] and type in WebEx in the search bar. Download [employer], the blue/green circle. The app is free but as with any other app downloads, their phone may require them to verify saved payment information or Apple password. The patient does NOT have to create an account.  - If Android, ask patient to go to [employer] and type in WebEx in the search bar. Download [employer], the blue/green circle. The app is free but as with any other app downloads, their phone may require them to verify saved payment information or Android password. The patient does NOT have to create an account.   CONSENT FOR TELE-HEALTH VISIT - PLEASE REVIEW  I hereby voluntarily request, consent and authorize [facility] and its employed or contracted physicians, physician assistants, nurse practitioners or other licensed health care professionals (the Practitioner), to provide me with telemedicine health care services (the "Services") as deemed necessary by the treating Practitioner. I acknowledge and consent to receive the Services by the Practitioner via telemedicine. I understand that the telemedicine visit will  involve communicating with the Practitioner through live audiovisual communication technology and the disclosure of certain medical information by electronic transmission. I acknowledge that I have been given the opportunity to request an in-person assessment or other available alternative prior to the telemedicine visit and am voluntarily participating in the telemedicine visit.  I understand that I have the right to withhold or withdraw my consent to the use of telemedicine in the course of my care at any time, without affecting my right to future care or treatment, and that the Practitioner or I may terminate the telemedicine visit at any time. I understand that I have the right to inspect all information obtained and/or recorded in the course of the telemedicine visit and may receive copies of available information for a reasonable fee.  I understand that some of the potential risks of receiving the Services via telemedicine include:  Marland Kitchen Delay or interruption in medical evaluation due to technological equipment failure or disruption; . Information transmitted may not be sufficient (e.g. poor resolution of images) to allow for appropriate medical decision making by the Practitioner; and/or  . In rare instances, security protocols could fail, causing a breach of personal health information.  Furthermore, I acknowledge that it is my responsibility to provide information about my medical history, conditions and care that is complete and accurate to the best of my ability. I acknowledge that Practitioner's advice, recommendations, and/or decision may be based on factors not within their control, such as incomplete or inaccurate data provided by me or distortions of diagnostic images or specimens that may result from electronic transmissions. I understand that the practice  of medicine is not an Chief Strategy Officer and that Practitioner makes no warranties or guarantees regarding treatment outcomes. I acknowledge that  I will receive a copy of this consent concurrently upon execution via email to the email address I last provided but may also request a printed copy by calling the office of [place].    I understand that my insurance will be billed for this visit.   I have read or had this consent read to me. . I understand the contents of this consent, which adequately explains the benefits and risks of the Services being provided via telemedicine.  . I have been provided ample opportunity to ask questions regarding this consent and the Services and have had my questions answered to my satisfaction. . I give my informed consent for the services to be provided through the use of telemedicine in my medical care  By participating in this telemedicine visit I agree to the above.

## 2019-03-29 ENCOUNTER — Encounter: Payer: Self-pay | Admitting: Cardiovascular Disease

## 2019-03-29 ENCOUNTER — Telehealth (INDEPENDENT_AMBULATORY_CARE_PROVIDER_SITE_OTHER): Payer: Medicare Other | Admitting: Cardiovascular Disease

## 2019-03-29 ENCOUNTER — Other Ambulatory Visit: Payer: Self-pay

## 2019-03-29 VITALS — BP 136/68 | HR 62 | Ht 65.0 in | Wt 182.0 lb

## 2019-03-29 DIAGNOSIS — I2510 Atherosclerotic heart disease of native coronary artery without angina pectoris: Secondary | ICD-10-CM

## 2019-03-29 DIAGNOSIS — E7800 Pure hypercholesterolemia, unspecified: Secondary | ICD-10-CM

## 2019-03-29 DIAGNOSIS — I442 Atrioventricular block, complete: Secondary | ICD-10-CM

## 2019-03-29 DIAGNOSIS — I10 Essential (primary) hypertension: Secondary | ICD-10-CM | POA: Diagnosis not present

## 2019-03-29 DIAGNOSIS — I480 Paroxysmal atrial fibrillation: Secondary | ICD-10-CM

## 2019-03-29 MED ORDER — HYDRALAZINE HCL 50 MG PO TABS: ORAL_TABLET | ORAL | 3 refills | Status: DC

## 2019-03-29 NOTE — Progress Notes (Signed)
Virtual Visit via Video Note   This visit type was conducted due to national recommendations for restrictions regarding the COVID-19 Pandemic (e.g. social distancing) in an effort to limit this patient's exposure and mitigate transmission in our community.  Due to her co-morbid illnesses, this patient is at least at moderate risk for complications without adequate follow up.  This format is felt to be most appropriate for this patient at this time.  All issues noted in this document were discussed and addressed.  A limited physical exam was performed with this format.  Please refer to the patient's chart for her consent to telehealth for [facility]   Evaluation Performed:  Follow-up visit  Date:  03/29/2019   ID:  AKESHA TRACE, DOB 1941-01-23, MRN [id number]  Patient Location: Home  Provider Location: Office  PCP:  Gaspar Garbe, MD  Cardiologist:  Chilton Si, MD  Electrophysiologist:  Lewayne Bunting, MD   Chief Complaint:  Hypertension  History of Present Illness:    ANTANETTE CONRY is a 78 y.o. female who presents via audio/video conferencing for a telehealth visit today.    LATRESHA CINA is a 78 y.o. female with complete heart block s/p PPM, paroxysmal atrial fibrillation, mild-moderate CAD, and hypertension here for follow up.  Ms. Bergstrand had a pacemaker implanted by Dr. Ladona Ridgel in 2015 due to complete heart block.  She was minimally symptomatic and reports that it was incidentally found when she presented for knee surgery.  Since that time she has been doing well.  She continues to have difficult to manage hypertension.  She last followed up with Dr. Ladona Ridgel on 10/2017 and her blood pressure was elevated.  Carvedilol was switched to twice daily dosing from once daily long-acting dosing. However she reports that she has been taking all her carvedilol in the AM.  Her BP has been mostly within range in the moring and increases throughout the day.  It is typically around 120  systolic in the AM.  She has been working with Dr. Wylene Simmer and takes clonidine as needed for SBP >160 mmHg.  She has been taking this most days in the evening because her average BP has ranged from the 150s-180s.  She saw him 10/2017 and there was concern that anxiety may be contributing, as her AM BP is well controlled and it seems to increase throughout the day.  She was prescribed clonazepam but hasn't seen any improvement in her BP since starting this medication.  She notes that when her BP is very elevate she is unable to sleep.   Ms. Klimas had LE swelling and a rash that were thought to be due to amlodipine.  This was discontinued and clonidine was increased.  She was also referred for an echo 09/13/18 that revealed LVEF 45-50% with diffuse hypokinesis and severe mitral annular calcification.  She was referred for a Lexiscan Myoview 10/07/18 that revealed a reversible defect in the inferior, apical and inferolateral regions.  This was associated with apical akinesis and inferoapical dyskinesis.  This was thought to be either ischemia or a pacemaker artifact.  Given that she was feeling well and has chronic kidney disease she elected medical management.  Clonidine was increased due to poorly controlled blood pressure.  She was admitted with hypertensive urgency.  Her blood pressure was as high as the 240s at home.  Troponin was mildly elevated and she underwent cardiac catheterization 10/2018 that revealed mild to moderate non-obstructive CAD.  There was some concern about  the proximal LAD.  However FFR was unremarkable.  At her last appointment Ms. Collier was feeling well and exercising.  Her BP in the office was elevated, though at home it was in the 120s-130s.  Her personal machine was running 20 mmHg lower that the in office machine, so hydralazine was increased to .  This was increased to  tid given that her BP remained elevated.  She has followed up with our pharmacist and struggled with both  high and low BPs. Most recently she was instructed to take spironolactone at noon.  She has been taking it in the evening.  She doesn't take clonidine if her SBP is <120.  She has been feeling great.  Her BP log shows that her average blood pressure in the morning before taking her medication is 135/68.  Before her afternoon pills it is 142/70, and before the evening is 136/60.  The only time she has readings in the 150s to 170s is in the afternoon or evening.  She has not had any low blood pressures in the last 2 weeks.  Overall she is feeling great.  She has no chest pain or shortness of breath.  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  She has no lower extremity edema, orthopnea, or PND.  The patient does not have symptoms concerning for COVID-19 infection (fever, chills, cough, or new shortness of breath).    Past Medical History:  Diagnosis Date  . Arthritis   . Atrial fibrillation (HCC)   . Diarrhea    since gallbladder removed  . 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 History of blood transfusion as a child   no abnormal reaction noted  . History of colon polyps   . Hyperlipidemia    takes Pravastatin daily  . Hypertension    takes Azor and Metoprolol daily  . Hypothyroidism    takes Synthroid daily  . Joint pain   . Presence of permanent cardiac pacemaker   . Urinary frequency   . Urinary urgency    Past Surgical History:  Procedure Laterality Date  . ABDOMINAL HYSTERECTOMY    . cataract surgery Bilateral   . CHOLECYSTECTOMY  1971  . COLONOSCOPY    . EYE SURGERY    . INSERT / REPLACE / REMOVE PACEMAKER     Medtronic PPM 11/01/14 (Dr. Lewayne Bunting)  . JOINT REPLACEMENT Right    knee  . LEFT HEART CATH AND CORONARY ANGIOGRAPHY N/A 11/01/2018   Procedure: LEFT HEART CATH AND CORONARY ANGIOGRAPHY;  Surgeon: Yvonne Kendall, MD;  Location: [facility] INVASIVE CV LAB;  Service:  Cardiovascular;  Laterality: N/A;  . PERMANENT PACEMAKER INSERTION N/A 11/01/2014   Procedure: PERMANENT PACEMAKER INSERTION;  Surgeon: Marinus Maw, MD;  Location: [facility] CATH LAB;  Service: Cardiovascular;  Laterality: N/A;  . stomach stapled  1985  . TOTAL KNEE ARTHROPLASTY Left 11/05/2015   Procedure: TOTAL KNEE ARTHROPLASTY;  Surgeon: Dannielle Huh, MD;  Location: [facility] OR;  Service: Orthopedics;  Laterality: Left;     No outpatient medications have been marked as taking for the 03/29/19 encounter (Appointment) with Chilton Si, MD.     Allergies:   Amlodipine   Social History   Tobacco Use  . Smoking status: Never Smoker  . Smokeless tobacco: Never Used  Substance Use Topics  . Alcohol use: Yes    Alcohol/week: 1.0 standard drinks  Types: 1 Glasses of wine per week  . Drug use: No     Family Hx: The patient's family history includes Heart attack in her father; Heart disease in her father; Stroke in her sister.  ROS:   Please see the history of present illness.     All other systems reviewed and are negative.   Prior CV studies:   The following studies were reviewed today:  Echo 10/30/14: Study Conclusions  - Left ventricle: The cavity size was normal. There was mild focal basal hypertrophy of the septum. Systolic function was normal. The estimated ejection fraction was in the range of 60% to 65%. Wall motion was normal; there were no regional wall motion abnormalities. - Aortic valve: Trileaflet; mildly thickened, mildly calcified leaflets. Transvalvular velocity was minimally increased. There was no stenosis. - Mitral valve: Calcified annulus. There was mild regurgitation. - Right ventricle: The cavity size was mildly dilated. Wall thickness was normal.  Echo 09/13/18: Study Conclusions  - Left ventricle: The cavity size was normal. There was mild focal basal hypertrophy of the septum. Systolic function was mildly reduced. The estimated  ejection fraction was in the range of 45% to 50%. Diffuse hypokinesis. Doppler parameters are consistent with both elevated ventricular end-diastolic filling pressure and elevated left atrial filling pressure. - Mitral valve: Severely calcified annulus. Mildly thickened, mildly calcified leaflets . - Left atrium: The atrium was mildly dilated. - Atrial septum: No defect or patent foramen ovale was identified.  Lexiscan Myoview 10/07/18:   The left ventricular ejection fraction is mildly decreased (45-54%).  Nuclear stress EF: 54%.  No T wave inversion was noted during stress.  There was no ST segment deviation noted during stress.  Defect 1: There is a large defect of moderate severity.  Large size, moderate severity partially reversible inferior, apical and inferolateral perfusion defect, suggestive of ischemia. This could also represent pacemaker-related artifact. There is apical akinesis and inferoapical dyskinesis. LVEF 54%. This is an intermediate risk study. Clinical correlation is advised.  LHC 10/2018: Conclusions: 1. Mild to moderate, non-obstructive CAD. Most severe lesion is a 50-60% stenosis in the proximal LCx that is not hemodynamically significant (DFR 1.0). I suspect mild troponin elevation reflects supply-demand mismatch in the setting of uncontrolled hypertension and renal insufficiency. 2. Normal left ventricular filling pressure.  Recommendations: 1. Medical therapy and risk factor modification to prevent progression of disease. 2. Consider noninvasive evaluation for renal artery stenosis (if not already performed as an outpatient), given hypertension and renal insufficiency.  Labs/Other Tests and Data Reviewed:    EKG:  No ECG reviewed.  Recent Labs: 10/30/2018: Magnesium 1.8; TSH 4.554 10/31/2018: ALT 17 01/05/2019: BUN 33; Creatinine, Ser 1.43; Hemoglobin 9.8; Platelets 161; Potassium 4.8; Sodium 142   Recent Lipid Panel Lab Results   Component Value Date/Time   CHOL 122 10/30/2018 06:52 PM   TRIG 51 10/30/2018 06:52 PM   HDL 52 10/30/2018 06:52 PM   CHOLHDL 2.3 10/30/2018 06:52 PM   LDLCALC 60 10/30/2018 06:52 PM    Wt Readings from Last 3 Encounters:  02/10/19 201 lb 12.8 oz (91.5 kg)  01/13/19 198 lb 3.2 oz (89.9 kg)  11/24/18 189 lb (85.7 kg)     Objective:    BP 136/68 Comment: 8:30 am  Pulse 62   Ht  (1.651 m)   Wt 182 lb (82.6 kg)   BMI 30.29 kg/m  GENERAL: Well-appearing.  No acute distress. HEENT: Pupils equal round.  Oral mucosa unremarkable NECK:  No jugular  venous distention, no visible thyromegaly EXT:  No edema, no cyanosis no clubbing SKIN:  No rashes no nodules NEURO:  Speech fluent.  Cranial nerves grossly intact.  Moves all 4 extremities freely PSYCH:  Cognitively intact, oriented to person place and time   ASSESSMENT & PLAN:    # PAF: Remains in sinus rhythm.  Continue Eliquis and carvedilol.   # Hypertension:  Blood pressure control is improving but continues to be high in the afternoon and evenings at times.  We will continue carvedilol, clonidine (holding if SBP is less than 120), on losartan, spironolactone, and increase hydralazine to 75 mg in the afternoon and morning.  She will continue 50 mg in the evening.  #Non-obstructive CAD: # Hyperlipidemia:   Left heart cath 10/2018 showed nonobstructive disease.  LDL was 60 on 10/2018.  She is not on aspirin given that she takes Eliquis.  Continue rosuvastatin.  # CHB s/p PPM: Currently ASVP.  Managed by Dr. Ladona Ridgelaylor.    # Rash: # LE Edema: Resolved after stopping amlodipine.   COVID-19 Education: The signs and symptoms of COVID-19 were discussed with the patient and how to seek care for testing (follow up with PCP or arrange E-visit).  The importance of social distancing was discussed today.  Time:   Today, I have spent 22 minutes with the patient with telehealth technology discussing the above problems.      Medication Adjustments/Labs and Tests Ordered: Current medicines are reviewed at length with the patient today.  Concerns regarding medicines are outlined above.   Tests Ordered: No orders of the defined types were placed in this encounter.  Medication Changes: No orders of the defined types were placed in this encounter.   Disposition:  Follow up in 1 month(s)    Signed, Chilton Siiffany [place], MD  03/29/2019 1:38 PM    [facility]

## 2019-03-29 NOTE — Patient Instructions (Addendum)
Medication Instructions:  INCREASE YOUR HYDRALAZINE TO 75 MG IN THE MORNING, 75 MG AT LUNCH, AND 50 MG IN THE EVENING If you need a refill on your cardiac medications before your next appointment, please call your pharmacy.   Lab work: NONE  Testing/Procedures: NONE  Follow-Up: Your physician recommends that you schedule a follow-up appointment in: 1 MONTH  THE OFFICE WILL CALL YOU WITH APPOINTMENT MONITOR AND TRACK YOUR BLOOD PRESSURE

## 2019-04-15 ENCOUNTER — Telehealth: Payer: Self-pay | Admitting: Pharmacist

## 2019-04-15 MED ORDER — HYDRALAZINE HCL 50 MG PO TABS: ORAL_TABLET | ORAL | 3 refills | Status: DC

## 2019-04-15 NOTE — Telephone Encounter (Signed)
*  PHONE FOLLOW UP*  Lauren Henry is a 78 y.o. female with PMH relevant for Afib, BBB, hyperlipidemia, and hypertension.   Patient reports compliance with therapy. Complaint of increased fatigue around 10am to 11am after morning medication doses.   Current HTN meds:  Carvedilol 40mg  CR daily Clonidine 0.2mg  twice daily (morning and evening) Hydralazine 75mg  in AM and noon, 50mg  HS Olmesartan 40mg  daily  BP goal: <130/80  Home BP readings:  Average morning readings (before medication): 130/66 Average home BP readings (11 readings) : 135/65   Current Outpatient Medications on File Prior to Visit  Medication Sig Dispense Refill  . apixaban (ELIQUIS) 5 MG TABS tablet Take 1 tablet (5 mg total) by mouth 2 (two) times daily. 180 tablet 3  . carvedilol (COREG CR) 40 MG 24 hr capsule Take 40 mg by mouth daily.    . cloNIDine (CATAPRES) 0.2 MG tablet Take 0.2 mg by mouth 3 (three) times daily. Hold if SBP less than 200    . Ferrous Sulfate (IRON) 142 (45 Fe) MG TBCR Take by mouth.    . hydrALAZINE (APRESOLINE) 50 MG tablet TAKE 1 AND 1/2 IN THE AM AND AT LUNCH. TAKE 1 IN THE EVENING 360 tablet 3  . levothyroxine (SYNTHROID, LEVOTHROID) 150 MCG tablet Take 150 mcg by mouth. Take 1 tablet by mouth and take an extra 1/2 tablet on two days  0  . mirtazapine (REMERON) 7.5 MG tablet TAKE 1 TABLET BY MOUTH AT BEDTIME FOR DEPRESSION/SLEEP    . Multiple Vitamin (MULTIVITAMIN WITH MINERALS) TABS tablet Take 1 tablet by mouth daily.    Marland Kitchen olmesartan (BENICAR) 40 MG tablet Take 1 tablet (40 mg total) by mouth daily. 90 tablet 3  . pantoprazole (PROTONIX) 40 MG tablet Take 40 mg by mouth daily.  3  . rosuvastatin (CRESTOR) 40 MG tablet Take 1 tablet (40 mg total) by mouth daily. 90 tablet 3  . spironolactone (ALDACTONE) 25 MG tablet Take 25 mg by mouth daily.   0   No current facility-administered medications on file prior to visit.     Allergies  Allergen Reactions  . Amlodipine Swelling and Rash    Assessment and Plan: Patient only conplain is low BPO and increased fatigue 2-3 hours after taking morning medication. Noted her morning BP reaing before medication is 130/66, then she is taking clonidine and hydralazine. More difficult to decrease morning clonidine to 0.1mg  in AM and 0.2mg  in PM due to tables size and inhabiliyt to cut them in half.  Will decrease hydralazinedose to 50mg  in AM , 75mg  at noon and 50mg  at HS. Plan to follow up by phone in 2 weeks once BP reading from patient received.  Time spent: 15 mi

## 2019-04-15 NOTE — Telephone Encounter (Signed)
rx sent

## 2019-04-18 NOTE — Telephone Encounter (Addendum)
Patient feeling ligh-headed and blurry vision.  BP readings 124/60 in AM; 79/46 at 1pm  AM medication:    Clonidine 0.2mg      Olmesartan 40mg     Hydralazine 50mg   Lunch time    Carvedilol CR    Hydralazine 75mg   Supper    Spironolactone    Clonidine 0.2mg     Hydralazine 50mg   *Instructed to stop taking clonidine 0.2mg  in morning*

## 2019-04-21 ENCOUNTER — Ambulatory Visit: Payer: Medicare Other | Admitting: Cardiovascular Disease

## 2019-04-26 ENCOUNTER — Telehealth: Payer: Self-pay | Admitting: Cardiovascular Disease

## 2019-04-26 NOTE — Telephone Encounter (Signed)
Smartphone/consent/ my chart/ pre reg completed °

## 2019-04-26 NOTE — Progress Notes (Signed)
Virtual Visit via Video Note   This visit type was conducted due to national recommendations for restrictions regarding the COVID-19 Pandemic (e.g. social distancing) in an effort to limit this patient's exposure and mitigate transmission in our community.  Due to her co-morbid illnesses, this patient is at least at moderate risk for complications without adequate follow up.  This format is felt to be most appropriate for this patient at this time.  All issues noted in this document were discussed and addressed.  A limited physical exam was performed with this format.  Please refer to the patient's chart for her consent to telehealth for [facility].   Evaluation Performed:  Follow-up visit  Date:  04/27/2019   ID:  ASHLI SELDERS, DOB Aug 08, 1941, MRN [id number]  Patient Location: Home  Provider Location: Office  PCP:  Gaspar Garbe, MD  Cardiologist:  Chilton Si, MD  Electrophysiologist:  Lewayne Bunting, MD   Chief Complaint:  Hypertension  History of Present Illness:    Lauren Henry is a 78 y.o. female who presents via audio/video conferencing for a telehealth visit today.    ZYAIRA VEJAR is a 78 y.o. female with complete heart block s/p PPM, paroxysmal atrial fibrillation, mild-moderate CAD, and hypertension here for follow up.  Ms. Bribiesca had a pacemaker implanted by Dr. Ladona Ridgel in 2015 due to complete heart block.  She was minimally symptomatic and reports that it was incidentally found when she presented for knee surgery.  Since that time she has been doing well.  She continues to have difficult to manage hypertension.  She last followed up with Dr. Ladona Ridgel on 10/2017 and her blood pressure was elevated.  Carvedilol was switched to twice daily dosing from once daily long-acting dosing. However she reports that she has been taking all her carvedilol in the AM.  Her BP has been mostly within range in the moring and increases throughout the day.  It is typically around 120  systolic in the AM.  She has been working with Dr. Wylene Simmer and takes clonidine as needed for SBP >160 mmHg.  She has been taking this most days in the evening because her average BP has ranged from the 150s-180s.  She saw him 10/2017 and there was concern that anxiety may be contributing, as her AM BP is well controlled and it seems to increase throughout the day.  She was prescribed clonazepam but hasn't seen any improvement in her BP since starting this medication.  She notes that when her BP is very elevate she is unable to sleep.   Ms. Stephani had LE swelling and a rash that were thought to be due to amlodipine.  This was discontinued and clonidine was increased.  She was also referred for an echo 09/13/18 that revealed LVEF 45-50% with diffuse hypokinesis and severe mitral annular calcification.  She was referred for a Lexiscan Myoview 10/07/18 that revealed a reversible defect in the inferior, apical and inferolateral regions.  This was associated with apical akinesis and inferoapical dyskinesis.  This was thought to be either ischemia or a pacemaker artifact.  Given that she was feeling well and has chronic kidney disease she elected medical management.  Clonidine was increased due to poorly controlled blood pressure.  She was admitted with hypertensive urgency.  Her blood pressure was as high as the 240s at home.  Troponin was mildly elevated and she underwent cardiac catheterization 10/2018 that revealed mild to moderate non-obstructive CAD.  There was some concern about  the proximal LAD.  However FFR was unremarkable.  At her last appointment Ms. Lacy DuverneyKerns was struggling with labile hypertension.  Her blood pressure was better controlled but was high in the afternoon and evenings.  We decided to continue hydralazine 50 mg in the evening and increase her morning and afternoon doses to 75 mg.  However she developed significant dizziness and did not tolerate that.  She called our pharmacist, Raquel  Rodriguez-Guzman, who decreased the morning dose back to 50 mg.  Since making that change she has been feeling much better.  Her blood pressure has been pretty well-controlled.  In the morning her systolic runs between 120 and [id number].  At lunch it is between 115 and 134.  In the evenings it runs between 138 and 145.  She has very rare episodes of dizziness.  She does describe photophobia but is otherwise well.  She has no chest pain, shortness of breath, lower extremity edema, orthopnea, or PND.  The patient does not have symptoms concerning for COVID-19 infection (fever, chills, cough, or new shortness of breath).    Past Medical History:  Diagnosis Date  . Arthritis   . Atrial fibrillation (HCC)   . Diarrhea    since gallbladder removed  . 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 History of blood transfusion as a child   no abnormal reaction noted  . History of colon polyps   . Hyperlipidemia    takes Pravastatin daily  . Hypertension    takes Azor and Metoprolol daily  . Hypothyroidism    takes Synthroid daily  . Joint pain   . Presence of permanent cardiac pacemaker   . Urinary frequency   . Urinary urgency    Past Surgical History:  Procedure Laterality Date  . ABDOMINAL HYSTERECTOMY    . cataract surgery Bilateral   . CHOLECYSTECTOMY  1971  . COLONOSCOPY    . EYE SURGERY    . INSERT / REPLACE / REMOVE PACEMAKER     Medtronic PPM 11/01/14 (Dr. Lewayne BuntingGregg Taylor)  . JOINT REPLACEMENT Right    knee  . LEFT HEART CATH AND CORONARY ANGIOGRAPHY N/A 11/01/2018   Procedure: LEFT HEART CATH AND CORONARY ANGIOGRAPHY;  Surgeon: Yvonne KendallEnd, Christopher, MD;  Location: [facility] INVASIVE CV LAB;  Service: Cardiovascular;  Laterality: N/A;  . PERMANENT PACEMAKER INSERTION N/A 11/01/2014   Procedure: PERMANENT PACEMAKER INSERTION;  Surgeon: Marinus MawGregg W Taylor, MD;  [facility];  Service: Cardiovascular;  Laterality: N/A;  . stomach stapled  1985  .  TOTAL KNEE ARTHROPLASTY Left 11/05/2015   Procedure: TOTAL KNEE ARTHROPLASTY;  Surgeon: Dannielle HuhSteve Lucey, MD;  Location: [facility] OR;  Service: Orthopedics;  Laterality: Left;     Current Meds  Medication Sig  . apixaban (ELIQUIS) 5 MG TABS tablet Take 1 tablet (5 mg total) by mouth 2 (two) times daily.  . carvedilol (COREG CR) 40 MG 24 hr capsule Take 40 mg by mouth daily.  . Ferrous Sulfate (IRON) 142 (45 Fe) MG TBCR Take by mouth.  . hydrALAZINE (APRESOLINE) 50 MG tablet TAKE 1 tablet in  AM and 1 IN THE EVENING. Take 1 and 1/2 tablet at lunch.  . levothyroxine (SYNTHROID, LEVOTHROID) 150 MCG tablet Take 150 mcg by mouth. Take 1 tablet by mouth and take an extra 1/2 tablet on two days  . mirtazapine (REMERON) 7.5 MG tablet TAKE 1 TABLET BY MOUTH AT BEDTIME FOR DEPRESSION/SLEEP  . Multiple Vitamin (MULTIVITAMIN  WITH MINERALS) TABS tablet Take 1 tablet by mouth daily.  Marland Kitchen olmesartan (BENICAR) 40 MG tablet Take 1 tablet (40 mg total) by mouth daily.  . pantoprazole (PROTONIX) 40 MG tablet Take 40 mg by mouth daily.  . rosuvastatin (CRESTOR) 40 MG tablet Take 1 tablet (40 mg total) by mouth daily.  Marland Kitchen spironolactone (ALDACTONE) 25 MG tablet Take 25 mg by mouth daily.      Allergies:   Amlodipine   Social History   Tobacco Use  . Smoking status: Never Smoker  . Smokeless tobacco: Never Used  Substance Use Topics  . Alcohol use: Yes    Alcohol/week: 1.0 standard drinks    Types: 1 Glasses of wine per week  . Drug use: No     Family Hx: The patient's family history includes Heart attack in her father; Heart disease in her father; Stroke in her sister.  ROS:   Please see the history of present illness.     All other systems reviewed and are negative.   Prior CV studies:   The following studies were reviewed today:  Echo 10/30/14: Study Conclusions  - Left ventricle: The cavity size was normal. There was mild focal basal hypertrophy of the septum. Systolic function was normal. The  estimated ejection fraction was in the range of 60% to 65%. Wall motion was normal; there were no regional wall motion abnormalities. - Aortic valve: Trileaflet; mildly thickened, mildly calcified leaflets. Transvalvular velocity was minimally increased. There was no stenosis. - Mitral valve: Calcified annulus. There was mild regurgitation. - Right ventricle: The cavity size was mildly dilated. Wall thickness was normal.  Echo 09/13/18: Study Conclusions  - Left ventricle: The cavity size was normal. There was mild focal basal hypertrophy of the septum. Systolic function was mildly reduced. The estimated ejection fraction was in the range of 45% to 50%. Diffuse hypokinesis. Doppler parameters are consistent with both elevated ventricular end-diastolic filling pressure and elevated left atrial filling pressure. - Mitral valve: Severely calcified annulus. Mildly thickened, mildly calcified leaflets . - Left atrium: The atrium was mildly dilated. - Atrial septum: No defect or patent foramen ovale was identified.  Lexiscan Myoview 10/07/18:   The left ventricular ejection fraction is mildly decreased (45-54%).  Nuclear stress EF: 54%.  No T wave inversion was noted during stress.  There was no ST segment deviation noted during stress.  Defect 1: There is a large defect of moderate severity.  Large size, moderate severity partially reversible inferior, apical and inferolateral perfusion defect, suggestive of ischemia. This could also represent pacemaker-related artifact. There is apical akinesis and inferoapical dyskinesis. LVEF 54%. This is an intermediate risk study. Clinical correlation is advised.  LHC 10/2018: Conclusions: 1. Mild to moderate, non-obstructive CAD. Most severe lesion is a 50-60% stenosis in the proximal LCx that is not hemodynamically significant (DFR 1.0). I suspect mild troponin elevation reflects supply-demand mismatch in the  setting of uncontrolled hypertension and renal insufficiency. 2. Normal left ventricular filling pressure.  Recommendations: 1. Medical therapy and risk factor modification to prevent progression of disease. 2. Consider noninvasive evaluation for renal artery stenosis (if not already performed as an outpatient), given hypertension and renal insufficiency.  Labs/Other Tests and Data Reviewed:    EKG:  No ECG reviewed.  Recent Labs: 10/30/2018: Magnesium 1.8; TSH 4.554 10/31/2018: ALT 17 01/05/2019: BUN 33; Creatinine, Ser 1.43; Hemoglobin 9.8; Platelets 161; Potassium 4.8; Sodium 142   Recent Lipid Panel Lab Results  Component Value Date/Time   CHOL 122  10/30/2018 06:52 PM   TRIG 51 10/30/2018 06:52 PM   HDL 52 10/30/2018 06:52 PM   CHOLHDL 2.3 10/30/2018 06:52 PM   LDLCALC 60 10/30/2018 06:52 PM    Wt Readings from Last 3 Encounters:  04/27/19 186 lb (84.4 kg)  03/29/19 182 lb (82.6 kg)  02/10/19 201 lb 12.8 oz (91.5 kg)     Objective:    BP 124/65   Pulse 70   Ht 5\' 5"  (1.651 m)   Wt 186 lb (84.4 kg)   BMI 30.95 kg/m  GENERAL: Well-appearing.  No acute distress. HEENT: Pupils equal round.  Oral mucosa unremarkable NECK:  No jugular venous distention, no visible thyromegaly EXT:  No edema, no cyanosis no clubbing SKIN:  No rashes no nodules NEURO:  Speech fluent.  Cranial nerves grossly intact.  Moves all 4 extremities freely PSYCH:  Cognitively intact, oriented to person place and time   ASSESSMENT & PLAN:    # PAF: Remains in sinus rhythm.    She has not experienced any recent palpitations.  Continue Eliquis and carvedilol.   # Hypertension:  Blood pressure is much better-controlled.  Her blood pressure still tends to run higher in the evenings.  However she is no longer dizzy.  Overall her systolic blood pressure averages around 130.  Therefore we will not make any additional changes at this time.  Continue carvedilol, clonidine, hydralazine, losartan, and  spironolactone.  #Non-obstructive CAD: # Hyperlipidemia:  Left heart cath 10/2018 showed nonobstructive disease.  LDL was 60 on 10/2018.  She is not on aspirin given that she takes Eliquis.  Continue rosuvastatin.  # CHB s/p PPM: Currently ASVP.  Managed by Dr. Ladona Ridgel.    # Rash: # LE Edema: Resolved after stopping amlodipine.   COVID-19 Education: The signs and symptoms of COVID-19 were discussed with the patient and how to seek care for testing (follow up with PCP or arrange E-visit).  The importance of social distancing was discussed today.  Time:   Today, I have spent 16 minutes with the patient with telehealth technology discussing the above problems.     Medication Adjustments/Labs and Tests Ordered: Current medicines are reviewed at length with the patient today.  Concerns regarding medicines are outlined above.   Tests Ordered: No orders of the defined types were placed in this encounter.  Medication Changes: No orders of the defined types were placed in this encounter.   Disposition:  Follow up in 6 month(s)    Signed, Chilton Si, MD  04/27/2019 8:58 AM    [facility]

## 2019-04-27 ENCOUNTER — Encounter: Payer: Self-pay | Admitting: Cardiovascular Disease

## 2019-04-27 ENCOUNTER — Telehealth (INDEPENDENT_AMBULATORY_CARE_PROVIDER_SITE_OTHER): Payer: Medicare Other | Admitting: Cardiovascular Disease

## 2019-04-27 DIAGNOSIS — I10 Essential (primary) hypertension: Secondary | ICD-10-CM | POA: Diagnosis not present

## 2019-04-27 DIAGNOSIS — Z7901 Long term (current) use of anticoagulants: Secondary | ICD-10-CM | POA: Diagnosis not present

## 2019-04-27 DIAGNOSIS — I2510 Atherosclerotic heart disease of native coronary artery without angina pectoris: Secondary | ICD-10-CM | POA: Diagnosis not present

## 2019-04-27 DIAGNOSIS — I442 Atrioventricular block, complete: Secondary | ICD-10-CM | POA: Diagnosis not present

## 2019-04-27 NOTE — Patient Instructions (Addendum)
Medication Instructions:  Your physician recommends that you continue on your current medications as directed. Please refer to the Current Medication list given to you today.  If you need a refill on your cardiac medications before your next appointment, please call your pharmacy.   Lab work: NONE  Testing/Procedures: NONE   Follow-Up: At [facility], you and your health needs are our priority.  As part of our continuing mission to provide you with exceptional heart care, we have created designated Provider Care Teams.  These Care Teams include your primary Cardiologist (physician) and Advanced Practice Providers (APPs -  Physician Assistants and Nurse Practitioners) who all work together to provide you with the care you need, when you need it. You will need a follow up appointment in 6 months.  Please call our office 2 months in advance to schedule this appointment.  You may see Jasslyn Finkel [place], MD or one of the following Advanced Practice Providers on your designated Care Team:   Luke Kilroy, PA-C Krista Kroeger, PA-C . Callie Goodrich, PA-C   

## 2019-05-04 ENCOUNTER — Ambulatory Visit (INDEPENDENT_AMBULATORY_CARE_PROVIDER_SITE_OTHER): Payer: Medicare Other | Admitting: *Deleted

## 2019-05-04 ENCOUNTER — Other Ambulatory Visit: Payer: Self-pay

## 2019-05-04 DIAGNOSIS — I442 Atrioventricular block, complete: Secondary | ICD-10-CM

## 2019-05-04 DIAGNOSIS — I480 Paroxysmal atrial fibrillation: Secondary | ICD-10-CM

## 2019-05-05 ENCOUNTER — Telehealth: Payer: Self-pay

## 2019-05-05 NOTE — Telephone Encounter (Signed)
Left message for patient to remind of missed remote transmission.  

## 2019-05-06 LAB — CUP PACEART REMOTE DEVICE CHECK
Battery Impedance: 258 Ohm
Battery Remaining Longevity: 108 mo
Battery Voltage: 2.79 V
Brady Statistic AP VP Percent: 42 %
Brady Statistic AP VS Percent: 0 %
Brady Statistic AS VP Percent: 58 %
Brady Statistic AS VS Percent: 0 %
Date Time Interrogation Session: 20200514180101
Implantable Lead Implant Date: 20151111
Implantable Lead Implant Date: 20151111
Implantable Lead Location: 753859
Implantable Lead Location: 753860
Implantable Lead Model: 5076
Implantable Lead Model: 5076
Implantable Pulse Generator Implant Date: 20151111
Lead Channel Impedance Value: 435 Ohm
Lead Channel Impedance Value: 578 Ohm
Lead Channel Pacing Threshold Amplitude: 0.75 V
Lead Channel Pacing Threshold Amplitude: 1.25 V
Lead Channel Pacing Threshold Pulse Width: 0.4 ms
Lead Channel Pacing Threshold Pulse Width: 0.4 ms
Lead Channel Setting Pacing Amplitude: 1.5 V
Lead Channel Setting Pacing Amplitude: 2.5 V
Lead Channel Setting Pacing Pulse Width: 0.4 ms
Lead Channel Setting Sensing Sensitivity: 2 mV

## 2019-05-23 NOTE — Progress Notes (Signed)
Remote pacemaker transmission.   

## 2019-06-20 ENCOUNTER — Other Ambulatory Visit: Payer: Self-pay | Admitting: Cardiovascular Disease

## 2019-06-20 NOTE — Telephone Encounter (Signed)
Rx(s) sent to pharmacy electronically.  

## 2019-08-03 ENCOUNTER — Ambulatory Visit (INDEPENDENT_AMBULATORY_CARE_PROVIDER_SITE_OTHER): Payer: Medicare Other | Admitting: *Deleted

## 2019-08-03 DIAGNOSIS — I442 Atrioventricular block, complete: Secondary | ICD-10-CM | POA: Diagnosis not present

## 2019-08-03 LAB — CUP PACEART REMOTE DEVICE CHECK
Battery Impedance: 282 Ohm
Battery Remaining Longevity: 104 mo
Battery Voltage: 2.79 V
Brady Statistic AP VP Percent: 38 %
Brady Statistic AP VS Percent: 0 %
Brady Statistic AS VP Percent: 62 %
Brady Statistic AS VS Percent: 0 %
Date Time Interrogation Session: 20200812123446
Implantable Lead Implant Date: 20151111
Implantable Lead Implant Date: 20151111
Implantable Lead Location: 753859
Implantable Lead Location: 753860
Implantable Lead Model: 5076
Implantable Lead Model: 5076
Implantable Pulse Generator Implant Date: 20151111
Lead Channel Impedance Value: 395 Ohm
Lead Channel Impedance Value: 578 Ohm
Lead Channel Pacing Threshold Amplitude: 0.625 V
Lead Channel Pacing Threshold Amplitude: 1.25 V
Lead Channel Pacing Threshold Pulse Width: 0.4 ms
Lead Channel Pacing Threshold Pulse Width: 0.4 ms
Lead Channel Setting Pacing Amplitude: 1.5 V
Lead Channel Setting Pacing Amplitude: 2.5 V
Lead Channel Setting Pacing Pulse Width: 0.4 ms
Lead Channel Setting Sensing Sensitivity: 2 mV

## 2019-08-05 ENCOUNTER — Other Ambulatory Visit (HOSPITAL_COMMUNITY): Payer: Self-pay | Admitting: Internal Medicine

## 2019-08-05 DIAGNOSIS — I1310 Hypertensive heart and chronic kidney disease without heart failure, with stage 1 through stage 4 chronic kidney disease, or unspecified chronic kidney disease: Secondary | ICD-10-CM

## 2019-08-06 ENCOUNTER — Other Ambulatory Visit: Payer: Self-pay | Admitting: Cardiovascular Disease

## 2019-08-08 ENCOUNTER — Ambulatory Visit (HOSPITAL_COMMUNITY)
Admission: RE | Admit: 2019-08-08 | Discharge: 2019-08-08 | Disposition: A | Discharge status: Home / Self Care | Payer: Medicare Other | Source: Ambulatory Visit | Attending: Surgery | Admitting: Surgery

## 2019-08-08 ENCOUNTER — Other Ambulatory Visit: Payer: Self-pay

## 2019-08-08 DIAGNOSIS — I1310 Hypertensive heart and chronic kidney disease without heart failure, with stage 1 through stage 4 chronic kidney disease, or unspecified chronic kidney disease: Secondary | ICD-10-CM | POA: Diagnosis present

## 2019-08-11 ENCOUNTER — Encounter: Payer: Self-pay | Admitting: Cardiology

## 2019-08-11 NOTE — Progress Notes (Signed)
Remote pacemaker transmission.   

## 2019-09-18 ENCOUNTER — Other Ambulatory Visit: Payer: Self-pay | Admitting: Cardiovascular Disease

## 2019-11-02 ENCOUNTER — Ambulatory Visit: Payer: Medicare Other | Admitting: Cardiovascular Disease

## 2019-11-02 ENCOUNTER — Ambulatory Visit (INDEPENDENT_AMBULATORY_CARE_PROVIDER_SITE_OTHER): Payer: Medicare Other | Admitting: *Deleted

## 2019-11-02 DIAGNOSIS — I480 Paroxysmal atrial fibrillation: Secondary | ICD-10-CM | POA: Diagnosis not present

## 2019-11-02 DIAGNOSIS — I442 Atrioventricular block, complete: Secondary | ICD-10-CM

## 2019-11-02 LAB — CUP PACEART REMOTE DEVICE CHECK
Battery Impedance: 307 Ohm
Battery Remaining Longevity: 102 mo
Battery Voltage: 2.79 V
Brady Statistic AP VP Percent: 38 %
Brady Statistic AP VS Percent: 0 %
Brady Statistic AS VP Percent: 62 %
Brady Statistic AS VS Percent: 0 %
Date Time Interrogation Session: 20201111124757
Implantable Lead Implant Date: 20151111
Implantable Lead Implant Date: 20151111
Implantable Lead Location: 753859
Implantable Lead Location: 753860
Implantable Lead Model: 5076
Implantable Lead Model: 5076
Implantable Pulse Generator Implant Date: 20151111
Lead Channel Impedance Value: 402 Ohm
Lead Channel Impedance Value: 592 Ohm
Lead Channel Pacing Threshold Amplitude: 0.625 V
Lead Channel Pacing Threshold Amplitude: 1.375 V
Lead Channel Pacing Threshold Pulse Width: 0.4 ms
Lead Channel Pacing Threshold Pulse Width: 0.4 ms
Lead Channel Setting Pacing Amplitude: 1.5 V
Lead Channel Setting Pacing Amplitude: 2.5 V
Lead Channel Setting Pacing Pulse Width: 0.4 ms
Lead Channel Setting Sensing Sensitivity: 2 mV

## 2019-11-24 NOTE — Progress Notes (Signed)
Remote pacemaker transmission.   

## 2019-12-21 ENCOUNTER — Other Ambulatory Visit: Payer: Self-pay

## 2019-12-21 ENCOUNTER — Encounter: Payer: Self-pay | Admitting: Cardiovascular Disease

## 2019-12-21 ENCOUNTER — Ambulatory Visit: Payer: Medicare Other | Admitting: Cardiovascular Disease

## 2019-12-21 VITALS — BP 111/57 | HR 71 | Ht 65.0 in | Wt 198.0 lb

## 2019-12-21 DIAGNOSIS — I2510 Atherosclerotic heart disease of native coronary artery without angina pectoris: Secondary | ICD-10-CM

## 2019-12-21 DIAGNOSIS — E7800 Pure hypercholesterolemia, unspecified: Secondary | ICD-10-CM | POA: Diagnosis not present

## 2019-12-21 DIAGNOSIS — I10 Essential (primary) hypertension: Secondary | ICD-10-CM

## 2019-12-21 MED ORDER — HYDRALAZINE HCL 50 MG PO TABS: ORAL_TABLET | ORAL | 3 refills | Status: DC

## 2019-12-21 NOTE — Patient Instructions (Signed)
Medication Instructions:  HYDRALAZINE: TAKE 50MG  IN THE MORNING TAKE 100MG  (2TABLETS) AT LUNCH TAKE 50MG  AT NIGHT *If you need a refill on your cardiac medications before your next appointment, please call your pharmacy*  Lab Work: NONE NEEDED  Testing/Procedures: NONE NEEDED  Follow-Up: At [facility], you and your health needs are our priority.  As part of our continuing mission to provide you with exceptional heart care, we have created designated Provider Care Teams.  These Care Teams include your primary Cardiologist (physician) and Advanced Practice Providers (APPs -  Physician Assistants and Nurse Practitioners) who all work together to provide you with the care you need, when you need it.  Your next appointment:   3 month(s)  The format for your next appointment:   In Person  Provider:   Skeet Latch, MD

## 2019-12-21 NOTE — Progress Notes (Signed)
Cardiology Office Note   Date:  12/21/2019   ID:  Lauren Henry, DOB 07/22/1941, MRN [id number]  PCP:  Gaspar Garbeisovec, Richard W, MD  Cardiologist:   Chilton Siiffany , MD   No chief complaint on file.    History of Present Illness: Lauren Henry is a 78 y.o. female with complete heart block s/p PPM, paroxysmal atrial fibrillation, mild-moderate CAD, and hypertensionhere for follow up.Lauren Henry had a pacemaker implanted by Dr. Ladona Ridgelaylor in 2015 due to complete heart block. She was minimally symptomatic and reports that it was incidentally found when she presented for knee surgery. Since that time she has been doing well. She continues to have difficult to manage hypertension. She last followed up with Dr. Ladona Ridgelaylor on 10/2017 and her blood pressure was elevated. Carvedilol was switched to twice daily dosing from once daily long-acting dosing. However she reports that she has been taking all her carvedilol in the AM. Her BP has been mostly within range in the moring and increases throughout the day. It is typically around 120 systolic in the AM. She has been working with Dr. Wylene Simmerisovec and takes clonidine as needed for SBP >160 mmHg. She has been taking this most days in the evening because her average BP has ranged from the 150s-180s. She saw him 10/2017 and there was concern that anxiety may be contributing, as her AM BP is well controlled and it seems to increase throughout the day. She was prescribed clonazepam but hasn't seen any improvement in her BP since starting this medication. She notes that when her BP is very elevate she is unable to sleep.   Lauren Henry had LE swelling and a rash that were thought to be due to amlodipine. This was discontinued and clonidine was increased. She was also referred for an echo 09/13/18 that revealed LVEF 45-50% with diffuse hypokinesis and severe mitral annular calcification. She was referred for a Lexiscan Myoview10/17/19 that revealed a reversible  defect in the inferior, apical and inferolateral regions. This was associated with apical akinesis and inferoapical dyskinesis. This was thought to be either ischemia or a pacemaker artifact. Given that she was feeling well and has chronic kidney disease she elected medical management. Clonidine was increased due to poorly controlled blood pressure. She was admitted with hypertensive urgency. Her blood pressure was as high as the 240s at home. Troponin was mildly elevated and she underwent cardiac catheterization 10/2018 that revealed mild to moderate non-obstructive CAD. There was some concern about the proximal LAD. However FFR was unremarkable.  Lauren Henry was struggling with labile hypertension.  Her blood pressure was better controlled but was high in the afternoon and evenings.  We decided to continue hydralazine 50 mg in the evening and increase her morning and afternoon doses to 75 mg.  However she developed significant dizziness and did not tolerate that.  She called our pharmacist, Lauren Henry, who decreased the morning dose back to 50 mg.  Since her last appointment she had shingles.  She has otherwise been well.  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  She denies lower extremity edema, orthopnea or PND.  She notices that her evening BP is consistently elevated.     Past Medical History:  Diagnosis Date  . Arthritis   . Atrial fibrillation (HCC)   . Diarrhea    since gallbladder removed  . 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 History of  blood transfusion as a child   no abnormal reaction noted  . History of colon polyps   . Hyperlipidemia    takes Pravastatin daily  . Hypertension    takes Azor and Metoprolol daily  . Hypothyroidism    takes Synthroid daily  . Joint pain   . Presence of permanent cardiac pacemaker   . Urinary frequency   . Urinary urgency     Past Surgical History:   Procedure Laterality Date  . ABDOMINAL HYSTERECTOMY    . cataract surgery Bilateral   . CHOLECYSTECTOMY  1971  . COLONOSCOPY    . EYE SURGERY    . INSERT / REPLACE / REMOVE PACEMAKER     Medtronic PPM 11/01/14 (Dr. Cristopher Peru)  . JOINT REPLACEMENT Right    knee  . LEFT HEART CATH AND CORONARY ANGIOGRAPHY N/A 11/01/2018   Procedure: LEFT HEART CATH AND CORONARY ANGIOGRAPHY;  Surgeon: Nelva Bush, MD;  Location: [facility];  Service: Cardiovascular;  Laterality: N/A;  . PERMANENT PACEMAKER INSERTION N/A 11/01/2014   Procedure: PERMANENT PACEMAKER INSERTION;  Surgeon: Evans Lance, MD;  Location: [facility] CATH LAB;  Service: Cardiovascular;  Laterality: N/A;  . stomach stapled  1985  . TOTAL KNEE ARTHROPLASTY Left 11/05/2015   Procedure: TOTAL KNEE ARTHROPLASTY;  Surgeon: Vickey Huger, MD;  Location: [facility];  Service: Orthopedics;  Laterality: Left;     Current Outpatient Medications  Medication Sig Dispense Refill  . apixaban (ELIQUIS) 5 MG TABS tablet Take 1 tablet (5 mg total) by mouth 2 (two) times daily. 180 tablet 3  . carvedilol (COREG CR) 40 MG 24 hr capsule Take 40 mg by mouth daily.    . cloNIDine (CATAPRES) 0.2 MG tablet Take 1 tablet (0.2 mg total) by mouth at bedtime. Hold if SBP less than 200. 90 tablet 3  . Ferrous Sulfate (IRON) 142 (45 Fe) MG TBCR Take by mouth.    . levothyroxine (SYNTHROID, LEVOTHROID) 150 MCG tablet Take 150 mcg by mouth. Take 1 tablet by mouth and take an extra 1/2 tablet on two days  0  . mirtazapine (REMERON) 7.5 MG tablet TAKE 1 TABLET BY MOUTH AT BEDTIME FOR DEPRESSION/SLEEP    . Multiple Vitamin (MULTIVITAMIN WITH MINERALS) TABS tablet Take 1 tablet by mouth daily.    Marland Kitchen olmesartan (BENICAR) 40 MG tablet TAKE 1 TABLET BY MOUTH DAILY 90 tablet 1  . pantoprazole (PROTONIX) 40 MG tablet Take 40 mg by mouth daily.  3  . rosuvastatin (CRESTOR) 40 MG tablet TAKE 1 TABLET BY MOUTH DAILY 90 tablet 0  . spironolactone (ALDACTONE) 25 MG tablet  Take 25 mg by mouth daily.   0  . hydrALAZINE (APRESOLINE) 50 MG tablet TAKE 50MG  IN THE MORNING, TAKE 100MG  (2TABLETS) AT LUNCH, TAKE 50MG  AT NIGHT 360 tablet 3   No current facility-administered medications for this visit.    Allergies:   Amlodipine    Social History:  The patient  reports that she has never smoked. She has never used smokeless tobacco. She reports current alcohol use of about 1.0 standard drinks of alcohol per week. She reports that she does not use drugs.   Family History:  The patient's family history includes Heart attack in her father; Heart disease in her father; Stroke in her sister.    ROS:  Please see the history of present illness.   Otherwise, review of systems are positive for none.   All other systems are reviewed and negative.    PHYSICAL  EXAM: VS:  BP (!) 111/57   Pulse 71   Ht 5\' 5"  (1.651 m)   Wt 198 lb (89.8 kg)   SpO2 99%   BMI 32.95 kg/m  , BMI Body mass index is 32.95 kg/m. GENERAL:  Well appearing HEENT:  Pupils equal round and reactive, fundi not visualized, oral mucosa unremarkable NECK:  No jugular venous distention, waveform within normal limits, carotid upstroke brisk and symmetric, no bruits, no thyromegaly LYMPHATICS:  No cervical adenopathy LUNGS:  Clear to auscultation bilaterally HEART:  RRR.  PMI not displaced or sustained,S1 and S2 within normal limits, no S3, no S4, no clicks, no rubs, no murmurs ABD:  Flat, positive bowel sounds normal in frequency in pitch, no bruits, no rebound, no guarding, no midline pulsatile mass, no hepatomegaly, no splenomegaly EXT:  2 plus pulses throughout, no edema, no cyanosis no clubbing SKIN:  No rashes no nodules NEURO:  Cranial nerves II through XII grossly intact, motor grossly intact throughout PSYCH:  Cognitively intact, oriented to person place and time   EKG:  EKG is ordered today. The ekg ordered today demonstrates AS-VP.  Rate 71 bpm.   Echo 10/30/14: Study Conclusions  - Left  ventricle: The cavity size was normal. There was mild focal basal hypertrophy of the septum. Systolic function was normal. The estimated ejection fraction was in the range of 60% to 65%. Wall motion was normal; there were no regional wall motion abnormalities. - Aortic valve: Trileaflet; mildly thickened, mildly calcified leaflets. Transvalvular velocity was minimally increased. There was no stenosis. - Mitral valve: Calcified annulus. There was mild regurgitation. - Right ventricle: The cavity size was mildly dilated. Wall thickness was normal.  Echo 09/13/18: Study Conclusions  - Left ventricle: The cavity size was normal. There was mild focal basal hypertrophy of the septum. Systolic function was mildly reduced. The estimated ejection fraction was in the range of 45% to 50%. Diffuse hypokinesis. Doppler parameters are consistent with both elevated ventricular end-diastolic filling pressure and elevated left atrial filling pressure. - Mitral valve: Severely calcified annulus. Mildly thickened, mildly calcified leaflets . - Left atrium: The atrium was mildly dilated. - Atrial septum: No defect or patent foramen ovale was identified.  Lexiscan Myoview10/17/19:   The left ventricular ejection fraction is mildly decreased (45-54%).  Nuclear stress EF: 54%.  No T wave inversion was noted during stress.  There was no ST segment deviation noted during stress.  Defect 1: There is a large defect of moderate severity.  Large size, moderate severity partially reversible inferior, apical and inferolateral perfusion defect, suggestive of ischemia. This could also represent pacemaker-related artifact. There is apical akinesis and inferoapical dyskinesis. LVEF 54%. This is an intermediate risk study. Clinical correlation is advised.  LHC 10/2018: Conclusions: 1. Mild to moderate, non-obstructive CAD. Most severe lesion is a 50-60% stenosis in the  proximal LCx that is not hemodynamically significant (DFR 1.0). I suspect mild troponin elevation reflects supply-demand mismatch in the setting of uncontrolled hypertension and renal insufficiency. 2. Normal left ventricular filling pressure.  Recommendations: 1. Medical therapy and risk factor modification to prevent progression of disease. 2. Consider noninvasive evaluation for renal artery stenosis (if not already performed as an outpatient), given hypertension and renal insufficiency.  Recent Labs: 01/05/2019: BUN 33; Creatinine, Ser 1.43; Hemoglobin 9.8; Platelets 161; Potassium 4.8; Sodium 142    Lipid Panel    Component Value Date/Time   CHOL 122 10/30/2018 1852   TRIG 51 10/30/2018 1852   HDL 52 10/30/2018  1852   CHOLHDL 2.3 10/30/2018 1852   VLDL 10 10/30/2018 1852   LDLCALC 60 10/30/2018 1852   07/22/2019: Total cholesterol 127, triglycerides 80, HDL 42, LDL 69  Wt Readings from Last 3 Encounters:  12/21/19 198 lb (89.8 kg)  04/27/19 186 lb (84.4 kg)  03/29/19 182 lb (82.6 kg)      ASSESSMENT AND PLAN:  # PAF: Remains in sinus rhythm. She is asymptomatic. No afib on device interrogation 10/2019.  She has not experienced any recent palpitations.  Continue Eliquis and carvedilol.   # Hypertension: Blood pressure is much better-controlled but she is concerned about her evening BP.  We will increase the afternoon hydralazine to .  Otherwise continue current regimen.    #Non-obstructive CAD: # Hyperlipidemia: Left heart cath 10/2018 showed nonobstructive disease. LDL was 69 on 06/2019. She is not on aspirin given that she takes Eliquis. Continue rosuvastatin.  # CHB s/p PPM: Currently ASVP.Managed by Dr. Ladona Ridgel.      Current medicines are reviewed at length with the patient today.  The patient does not have concerns regarding medicines.  The following changes have been made:  Afternoon hydralazine  daily   Labs/ tests ordered today  include:  No orders of the defined types were placed in this encounter.    Disposition:   FU with Kristol Almanzar C. Duke Salvia, MD, [facility] in 3 months     Signed, Reford Olliff C. Duke Salvia, MD, [facility]  12/21/2019 5:22 PM    [facility]

## 2019-12-24 ENCOUNTER — Other Ambulatory Visit: Payer: Self-pay | Admitting: Cardiovascular Disease

## 2019-12-26 ENCOUNTER — Other Ambulatory Visit (INDEPENDENT_AMBULATORY_CARE_PROVIDER_SITE_OTHER): Payer: Medicare PPO

## 2019-12-26 DIAGNOSIS — I4891 Unspecified atrial fibrillation: Secondary | ICD-10-CM

## 2019-12-26 DIAGNOSIS — I2510 Atherosclerotic heart disease of native coronary artery without angina pectoris: Secondary | ICD-10-CM

## 2019-12-26 DIAGNOSIS — I10 Essential (primary) hypertension: Secondary | ICD-10-CM | POA: Diagnosis not present

## 2019-12-26 DIAGNOSIS — I480 Paroxysmal atrial fibrillation: Secondary | ICD-10-CM

## 2019-12-26 DIAGNOSIS — I442 Atrioventricular block, complete: Secondary | ICD-10-CM

## 2019-12-26 DIAGNOSIS — I1310 Hypertensive heart and chronic kidney disease without heart failure, with stage 1 through stage 4 chronic kidney disease, or unspecified chronic kidney disease: Secondary | ICD-10-CM

## 2019-12-26 DIAGNOSIS — Z7901 Long term (current) use of anticoagulants: Secondary | ICD-10-CM

## 2019-12-26 DIAGNOSIS — E7800 Pure hypercholesterolemia, unspecified: Secondary | ICD-10-CM | POA: Diagnosis not present

## 2020-01-10 ENCOUNTER — Encounter: Payer: Medicare Other | Admitting: Internal Medicine

## 2020-01-23 ENCOUNTER — Ambulatory Visit: Payer: Medicare PPO | Admitting: Internal Medicine

## 2020-01-23 ENCOUNTER — Other Ambulatory Visit: Payer: Self-pay

## 2020-01-23 VITALS — BP 132/66 | HR 71 | Ht 65.0 in | Wt 198.0 lb

## 2020-01-23 DIAGNOSIS — I10 Essential (primary) hypertension: Secondary | ICD-10-CM | POA: Diagnosis not present

## 2020-01-23 DIAGNOSIS — I442 Atrioventricular block, complete: Secondary | ICD-10-CM | POA: Diagnosis not present

## 2020-01-23 DIAGNOSIS — Z950 Presence of cardiac pacemaker: Secondary | ICD-10-CM | POA: Diagnosis not present

## 2020-01-23 NOTE — Patient Instructions (Signed)
Medication Instructions:  Your physician recommends that you continue on your current medications as directed. Please refer to the Current Medication list given to you today.  Labwork: None ordered.  Testing/Procedures: None ordered.  Follow-Up: Your physician wants you to follow-up in: one year with Dr. Ladona Ridgel.   You will receive a reminder letter in the mail two months in advance. If you don't receive a letter, please call our office to schedule the follow-up appointment.  Remote monitoring is used to monitor your Pacemaker from home. This monitoring reduces the number of office visits required to check your device to one time per year. It allows [place] to keep an eye on the functioning of your device to ensure it is working properly. You are scheduled for a device check from home on 02/01/2020. You may send your transmission at any time that day. If you have a wireless device, the transmission will be sent automatically.    Any Other Special Instructions Will Be Listed Below (If Applicable).  If you need a refill on your cardiac medications before your next appointment, please call your pharmacy.

## 2020-01-23 NOTE — Progress Notes (Signed)
HPI [name] returns today for followup. She is a pleasant 79 yo woman with CHB, s/p PPM, HTN, non-obstructive CAD, and chronic renal insufficiency. She has done well in the interim with no chest pain, sob, edema or syncope. She has not been in the hospital. Allergies  Allergen Reactions  . Amlodipine Swelling and Rash     Current Outpatient Medications  Medication Sig Dispense Refill  . apixaban (ELIQUIS) 5 MG TABS tablet Take 1 tablet (5 mg total) by mouth 2 (two) times daily. 180 tablet 3  . carvedilol (COREG CR) 40 MG 24 hr capsule Take 40 mg by mouth daily.    . cloNIDine (CATAPRES) 0.2 MG tablet Take 1 tablet (0.2 mg total) by mouth at bedtime. Hold if SBP less than 200. 90 tablet 3  . Ferrous Sulfate (IRON) 142 (45 Fe) MG TBCR Take by mouth.    . hydrALAZINE (APRESOLINE) 50 MG tablet TAKE 50MG  IN THE MORNING, TAKE 100MG  (2TABLETS) AT LUNCH, TAKE 50MG  AT NIGHT 360 tablet 3  . levothyroxine (SYNTHROID, LEVOTHROID) 150 MCG tablet Take 150 mcg by mouth. Take 1 tablet by mouth and take an extra 1/2 tablet on two days  0  . mirtazapine (REMERON) 7.5 MG tablet TAKE 1 TABLET BY MOUTH AT BEDTIME FOR DEPRESSION/SLEEP    . Multiple Vitamin (MULTIVITAMIN WITH MINERALS) TABS tablet Take 1 tablet by mouth daily.    Marland Kitchen olmesartan (BENICAR) 40 MG tablet TAKE 1 TABLET BY MOUTH DAILY 90 tablet 1  . pantoprazole (PROTONIX) 40 MG tablet Take 40 mg by mouth daily.  3  . rosuvastatin (CRESTOR) 40 MG tablet TAKE 1 TABLET BY MOUTH DAILY 90 tablet 3  . spironolactone (ALDACTONE) 25 MG tablet Take 25 mg by mouth daily.   0   No current facility-administered medications for this visit.     Past Medical History:  Diagnosis Date  . Arthritis   . Atrial fibrillation ([place])   . Diarrhea    since gallbladder removed  . 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 History of blood transfusion as a child   no abnormal reaction noted  . History of colon  polyps   . Hyperlipidemia    takes Pravastatin daily  . Hypertension    takes Azor and Metoprolol daily  . Hypothyroidism    takes Synthroid daily  . Joint pain   . Presence of permanent cardiac pacemaker   . Urinary frequency   . Urinary urgency     ROS:   All systems reviewed and negative except as noted in the HPI.   Past Surgical History:  Procedure Laterality Date  . ABDOMINAL HYSTERECTOMY    . cataract surgery Bilateral   . CHOLECYSTECTOMY  1971  . COLONOSCOPY    . EYE SURGERY    . INSERT / REPLACE / REMOVE PACEMAKER     Medtronic PPM 11/01/14 (Dr. Cristopher Peru)  . JOINT REPLACEMENT Right    knee  . LEFT HEART CATH AND CORONARY ANGIOGRAPHY N/A 11/01/2018   Procedure: LEFT HEART CATH AND CORONARY ANGIOGRAPHY;  Surgeon: Nelva Bush, MD;  Location: [place] [facility] LAB;  Service: Cardiovascular;  Laterality: N/A;  . PERMANENT PACEMAKER INSERTION N/A 11/01/2014   Procedure: PERMANENT PACEMAKER INSERTION;  Surgeon: Evans Lance, MD;  [facility] CATH LAB;  Service: Cardiovascular;  Laterality: N/A;  . stomach stapled  1985  . TOTAL KNEE ARTHROPLASTY Left 11/05/2015   Procedure:  TOTAL KNEE ARTHROPLASTY;  Surgeon: Dannielle Huh, MD;  Location: [facility] OR;  Service: Orthopedics;  Laterality: Left;     Family History  Problem Relation Age of Onset  . Heart disease Father   . Heart attack Father   . Stroke Sister      Social History   Socioeconomic History  . Marital status: Married    Spouse name: Not on file  . Number of children: Not on file  . Years of education: Not on file  . Highest education level: Not on file  Occupational History  . Not on file  Tobacco Use  . Smoking status: Never Smoker  . Smokeless tobacco: Never Used  Substance and Sexual Activity  . Alcohol use: Yes    Alcohol/week: 1.0 standard drinks    Types: 1 Glasses of wine per week  . Drug use: No  . Sexual activity: Not Currently    Birth control/protection: Surgical  Other Topics  Concern  . Not on file  Social History Narrative  . Not on file   Social Determinants of Health   Financial Resource Strain:   . Difficulty of Paying Living Expenses: Not on file  Food Insecurity:   . Worried About Programme researcher, broadcasting/film/video in the Last Year: Not on file  . Ran Out of Food in the Last Year: Not on file  Transportation Needs:   . Lack of Transportation (Medical): Not on file  . Lack of Transportation (Non-Medical): Not on file  Physical Activity:   . Days of Exercise per Week: Not on file  . Minutes of Exercise per Session: Not on file  Stress:   . Feeling of Stress : Not on file  Social Connections:   . Frequency of Communication with Friends and Family: Not on file  . Frequency of Social Gatherings with Friends and Family: Not on file  . Attends Religious Services: Not on file  . Active Member of Clubs or Organizations: Not on file  . Attends Banker Meetings: Not on file  . Marital Status: Not on file  Intimate Partner Violence:   . Fear of Current or Ex-Partner: Not on file  . Emotionally Abused: Not on file  . Physically Abused: Not on file  . Sexually Abused: Not on file     BP 132/66   Pulse 71   Ht 5\' 5"  (1.651 m)   Wt 198 lb (89.8 kg)   SpO2 97%   BMI 32.95 kg/m   Physical Exam:  Well appearing NAD HEENT: Unremarkable Neck:  No JVD, no thyromegally Lymphatics:  No adenopathy Back:  No CVA tenderness Lungs:  Clear with no wheezes HEART:  Regular rate rhythm, no murmurs, no rubs, no clicks Abd:  soft, positive bowel sounds, no organomegally, no rebound, no guarding Ext:  2 plus pulses, no edema, no cyanosis, no clubbing Skin:  No rashes no nodules Neuro:  CN II through XII intact, motor grossly intact   DEVICE  Normal device function.  See PaceArt for details.   Assess/Plan: 1. CHB -she is asymptomatic, s/p PPM insertion. 2. PPM - her Medtronic DDD PM is working normally. We will recheck quarterly remotely.  3. HTN - her bp  is minimally elevated. We will follow.  .D.

## 2020-02-07 ENCOUNTER — Telehealth: Payer: Self-pay | Admitting: Internal Medicine

## 2020-02-07 ENCOUNTER — Ambulatory Visit (INDEPENDENT_AMBULATORY_CARE_PROVIDER_SITE_OTHER): Payer: Medicare PPO | Admitting: *Deleted

## 2020-02-07 DIAGNOSIS — I480 Paroxysmal atrial fibrillation: Secondary | ICD-10-CM

## 2020-02-07 LAB — CUP PACEART REMOTE DEVICE CHECK
Battery Impedance: 333 Ohm
Battery Remaining Longevity: 98 mo
Battery Voltage: 2.79 V
Brady Statistic AP VP Percent: 39 %
Brady Statistic AP VS Percent: 0 %
Brady Statistic AS VP Percent: 61 %
Brady Statistic AS VS Percent: 0 %
Date Time Interrogation Session: 20210216164112
Implantable Lead Implant Date: 20151111
Implantable Lead Implant Date: 20151111
Implantable Lead Location: 753859
Implantable Lead Location: 753860
Implantable Lead Model: 5076
Implantable Lead Model: 5076
Implantable Pulse Generator Implant Date: 20151111
Lead Channel Impedance Value: 423 Ohm
Lead Channel Impedance Value: 645 Ohm
Lead Channel Pacing Threshold Amplitude: 0.5 V
Lead Channel Pacing Threshold Amplitude: 1.5 V
Lead Channel Pacing Threshold Pulse Width: 0.4 ms
Lead Channel Pacing Threshold Pulse Width: 0.4 ms
Lead Channel Setting Pacing Amplitude: 1.5 V
Lead Channel Setting Pacing Amplitude: 2.5 V
Lead Channel Setting Pacing Pulse Width: 0.46 ms
Lead Channel Setting Sensing Sensitivity: 2 mV

## 2020-02-07 NOTE — Telephone Encounter (Signed)
I help the pt send a manual transmission. Transmission received. The pt is added to the schedule in epic

## 2020-02-07 NOTE — Telephone Encounter (Signed)
Patient is calling due to being advised by our office to reach out once she received her new monitor for her pacemaker. Please advise.

## 2020-02-07 NOTE — Telephone Encounter (Signed)
Attempted to return call, no answer.  LVM with direct # for device clinic.

## 2020-02-08 NOTE — Progress Notes (Signed)
PPM Remote  

## 2020-02-17 ENCOUNTER — Other Ambulatory Visit: Payer: Self-pay

## 2020-02-17 DIAGNOSIS — I480 Paroxysmal atrial fibrillation: Secondary | ICD-10-CM

## 2020-02-17 MED ORDER — APIXABAN 5 MG PO TABS: 5.0000 mg | ORAL_TABLET | Freq: Two times a day (BID) | ORAL | 1 refills | Status: DC

## 2020-02-17 NOTE — Telephone Encounter (Signed)
Pt last saw Dr Duke Salvia 12/21/19, last labs 07/22/19 Creat 1.9, age 79, weight 89.8kg, based on specified criteria pt is on appropriate dosage of Eliquis 5mg  BID.  Will refill rx.

## 2020-03-06 ENCOUNTER — Encounter: Payer: Medicare PPO | Admitting: Internal Medicine

## 2020-03-09 ENCOUNTER — Other Ambulatory Visit: Payer: Self-pay | Admitting: Cardiovascular Disease

## 2020-03-15 ENCOUNTER — Other Ambulatory Visit: Payer: Self-pay

## 2020-03-15 ENCOUNTER — Ambulatory Visit (INDEPENDENT_AMBULATORY_CARE_PROVIDER_SITE_OTHER): Payer: Medicare PPO | Admitting: Cardiovascular Disease

## 2020-03-15 VITALS — BP 140/82 | HR 63 | Ht 65.0 in | Wt 193.0 lb

## 2020-03-15 DIAGNOSIS — I2510 Atherosclerotic heart disease of native coronary artery without angina pectoris: Secondary | ICD-10-CM

## 2020-03-15 DIAGNOSIS — E7800 Pure hypercholesterolemia, unspecified: Secondary | ICD-10-CM | POA: Diagnosis not present

## 2020-03-15 DIAGNOSIS — I10 Essential (primary) hypertension: Secondary | ICD-10-CM | POA: Diagnosis not present

## 2020-03-15 DIAGNOSIS — I480 Paroxysmal atrial fibrillation: Secondary | ICD-10-CM | POA: Diagnosis not present

## 2020-03-15 MED ORDER — OLMESARTAN MEDOXOMIL 40 MG PO TABS: 40.0000 mg | ORAL_TABLET | Freq: Every day | ORAL | 3 refills | Status: DC

## 2020-03-15 MED ORDER — SPIRONOLACTONE 25 MG PO TABS: 25.0000 mg | ORAL_TABLET | Freq: Every day | ORAL | 3 refills | Status: DC

## 2020-03-15 NOTE — Patient Instructions (Signed)
Medication Instructions:  DECREASE YOUR CLONIDINE TO 1/2 TABLET AT BEDTIME  *If you need a refill on your cardiac medications before your next appointment, please call your pharmacy*  Lab Work: NONE  If you have labs (blood work) drawn today and your tests are completely normal, you will receive your results only by: Marland Kitchen MyChart Message (if you have MyChart) OR . A paper copy in the mail If you have any lab test that is abnormal or we need to change your treatment, we will call you to review the results.  Testing/Procedures: NONE   Follow-Up: At [facility], you and your health needs are our priority.  As part of our continuing mission to provide you with exceptional heart care, we have created designated Provider Care Teams.  These Care Teams include your primary Cardiologist (physician) and Advanced Practice Providers (APPs -  Physician Assistants and Nurse Practitioners) who all work together to provide you with the care you need, when you need it.  We recommend signing up for the patient portal called "MyChart".  Sign up information is provided on this After Visit Summary.  MyChart is used to connect with patients for Virtual Visits (Telemedicine).  Patients are able to view lab/test results, encounter notes, upcoming appointments, etc.  Non-urgent messages can be sent to your provider as well.   To learn more about what you can do with MyChart, go to [email or web address].    Your next appointment:   2 month(s)  The format for your next appointment:   In Person  Provider:   You may see Chilton Si, MD or one of the following Advanced Practice Providers on your designated Care Team:    Corine Shelter, PA-C  [place], [place]  Edd Fabian, [place]  Other Instructions MONITOR YOUR BLOOD PRESSURE AT HOME. CALL OR SEND READINGS BY MYCHART IN 1 TO 2 WEEKS
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
## 2020-03-19 ENCOUNTER — Encounter: Payer: Self-pay | Admitting: Cardiovascular Disease

## 2020-04-17 DIAGNOSIS — H35033 Hypertensive retinopathy, bilateral: Secondary | ICD-10-CM | POA: Diagnosis not present

## 2020-04-17 DIAGNOSIS — H40052 Ocular hypertension, left eye: Secondary | ICD-10-CM | POA: Diagnosis not present

## 2020-05-08 ENCOUNTER — Ambulatory Visit (INDEPENDENT_AMBULATORY_CARE_PROVIDER_SITE_OTHER): Payer: Medicare PPO | Admitting: *Deleted

## 2020-05-08 DIAGNOSIS — I442 Atrioventricular block, complete: Secondary | ICD-10-CM

## 2020-05-08 LAB — CUP PACEART REMOTE DEVICE CHECK
Battery Impedance: 381 Ohm
Battery Remaining Longevity: 94 mo
Battery Voltage: 2.79 V
Brady Statistic AP VP Percent: 34 %
Brady Statistic AP VS Percent: 0 %
Brady Statistic AS VP Percent: 66 %
Brady Statistic AS VS Percent: 0 %
Date Time Interrogation Session: 20210518084236
Implantable Lead Implant Date: 20151111
Implantable Lead Implant Date: 20151111
Implantable Lead Location: 753859
Implantable Lead Location: 753860
Implantable Lead Model: 5076
Implantable Lead Model: 5076
Implantable Pulse Generator Implant Date: 20151111
Lead Channel Impedance Value: 453 Ohm
Lead Channel Impedance Value: 629 Ohm
Lead Channel Pacing Threshold Amplitude: 0.625 V
Lead Channel Pacing Threshold Amplitude: 1.75 V
Lead Channel Pacing Threshold Pulse Width: 0.4 ms
Lead Channel Pacing Threshold Pulse Width: 0.4 ms
Lead Channel Setting Pacing Amplitude: 1.5 V
Lead Channel Setting Pacing Amplitude: 2.5 V
Lead Channel Setting Pacing Pulse Width: 0.46 ms
Lead Channel Setting Sensing Sensitivity: 2 mV

## 2020-05-10 NOTE — Progress Notes (Signed)
Remote pacemaker transmission.   

## 2020-05-16 ENCOUNTER — Other Ambulatory Visit: Payer: Self-pay

## 2020-05-16 ENCOUNTER — Ambulatory Visit: Payer: Medicare PPO | Admitting: Cardiology

## 2020-05-16 ENCOUNTER — Encounter: Payer: Self-pay | Admitting: Cardiology

## 2020-05-16 VITALS — BP 132/64 | HR 72 | Temp 97.2°F | Ht 65.0 in | Wt 189.0 lb

## 2020-05-16 DIAGNOSIS — I442 Atrioventricular block, complete: Secondary | ICD-10-CM | POA: Diagnosis not present

## 2020-05-16 DIAGNOSIS — N183 Chronic kidney disease, stage 3 unspecified: Secondary | ICD-10-CM

## 2020-05-16 DIAGNOSIS — I10 Essential (primary) hypertension: Secondary | ICD-10-CM | POA: Diagnosis not present

## 2020-05-16 DIAGNOSIS — I4891 Unspecified atrial fibrillation: Secondary | ICD-10-CM

## 2020-05-16 DIAGNOSIS — I480 Paroxysmal atrial fibrillation: Secondary | ICD-10-CM

## 2020-05-16 DIAGNOSIS — D509 Iron deficiency anemia, unspecified: Secondary | ICD-10-CM | POA: Diagnosis not present

## 2020-05-16 DIAGNOSIS — Z7901 Long term (current) use of anticoagulants: Secondary | ICD-10-CM

## 2020-05-16 DIAGNOSIS — I2510 Atherosclerotic heart disease of native coronary artery without angina pectoris: Secondary | ICD-10-CM | POA: Diagnosis not present

## 2020-05-16 DIAGNOSIS — Z950 Presence of cardiac pacemaker: Secondary | ICD-10-CM

## 2020-05-16 NOTE — Patient Instructions (Signed)
Medication Instructions:   Change Spironolactone---take in the morning.  *If you need a refill on your cardiac medications before your next appointment, please call your pharmacy*   Follow-Up: At [facility], you and your health needs are our priority.  As part of our continuing mission to provide you with exceptional heart care, we have created designated Provider Care Teams.  These Care Teams include your primary Cardiologist (physician) and Advanced Practice Providers (APPs -  Physician Assistants and Nurse Practitioners) who all work together to provide you with the care you need, when you need it.  We recommend signing up for the patient portal called "MyChart".  Sign up information is provided on this After Visit Summary.  MyChart is used to connect with patients for Virtual Visits (Telemedicine).  Patients are able to view lab/test results, encounter notes, upcoming appointments, etc.  Non-urgent messages can be sent to your provider as well.   To learn more about what you can do with MyChart, go to [email or web address].    Your next appointment:   6 month(s)  The format for your next appointment:   In Person  Provider:   You may see Chilton Si, MD or one of the following Advanced Practice Providers on your designated Care Team:    Corine Shelter, PA-C  [place], [place]  Edd Fabian, [place]    Other Instructions Your physician has requested that you regularly monitor and record your blood pressure readings at home, 2-3 times weekly. Please call our office at [phone] if your blood pressure is consistently 140/85 or above.  Please call our office 2 months in advance to schedule your follow-up appointment with Dr. Duke Salvia.

## 2020-05-16 NOTE — Assessment & Plan Note (Signed)
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

## 2020-05-16 NOTE — Progress Notes (Signed)
Cardiology Office Note:    Date:  05/16/2020   ID:  Lauren Henry, DOB December 14, 1941, MRN [id number]  PCP:  Gaspar Garbe, MD  Cardiologist:  Chilton Si, MD  Electrophysiologist:  Lewayne Bunting, MD   Referring MD: Gaspar Garbe, MD   Chief Complaint  Patient presents with  . Follow-up    2 months.    History of Present Illness:    Lauren Henry is a pleasant 79 y.o. female followed by Dr Duke Salvia and Dr Ladona Ridgel.  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  She has a hx of PAF and is on Eliquis.  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    She has hypertension which became uncontrolled in Oct 2019 after she was taken off Amlodipine secondary to a rash.  She presented to the emergency room 10/30/2018 with shortness of breath.  Her blood pressure was uncontrolled and she had acute on chronic renal insufficiency.  Her troponin was also elevated.  She was admitted and underwent diagnostic catheterization which revealed a 50 to 60% mid CFX lesion and no other significant coronary disease.  It was felt that her elevated troponin was secondary to uncontrolled hypertension and acute on chronic renal insufficiency.  Her B/P came under better control with adjustment in her medications.  Echocardiogram done 10/31/2018 showed mild LVH with an EF of 55 to 60% and grade 1 diastolic dysfunction.  She had moderate aortic calcification. Renal dopplers done Aug 2020 showed no RAS. During that admission she was also noted to be anemic and had a colonoscopy and endoscopy (Dr Brayton Layman. She tells me that no apparent source for bleeding was noted.    She last saw Dr Duke Salvia 03/15/2020.  The patient reported a history of falls in the prior months before that visit.  The sounded mechanical in nature but the patient also reported low blood pressure readings at home.  Dr. Duke Salvia suggested she reduce her clonidine to 0.1 mg daily and track her  blood pressure.  She is seen in the office today for follow-up.  Since we saw her last she has been doing well.  She is not had any further falls.  She brought in a notebook full of blood pressure readings at home, she has been checking it twice a day.  Her blood pressure seems consistently between 110 systolic and 135 systolic.  In reviewing her medications she tells me she was taking her Benicar in the morning, her Aldactone 25 mg at night, her clonidine 0.1 mg at night, her hydralazine p.m. dose at night, and her carvedilol in the afternoon.  She noted that her morning blood pressures were a little bit on the low side.  I suggested she change her Aldactone to every morning.    Past Medical History:  Diagnosis Date  . Arthritis   . Atrial fibrillation (HCC)   . Diarrhea    since gallbladder removed  . 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 History of blood transfusion as a child   no abnormal reaction noted  . History of colon polyps   . Hyperlipidemia    takes Pravastatin daily  . Hypertension    takes Azor and Metoprolol daily  . Hypothyroidism    takes Synthroid daily  . Joint pain   . Presence of permanent cardiac pacemaker   . Urinary frequency   .  Urinary urgency     Past Surgical History:  Procedure Laterality Date  . ABDOMINAL HYSTERECTOMY    . cataract surgery Bilateral   . CHOLECYSTECTOMY  1971  . COLONOSCOPY    . EYE SURGERY    . INSERT / REPLACE / REMOVE PACEMAKER     Medtronic PPM 11/01/14 (Dr. Lewayne Bunting)  . JOINT REPLACEMENT Right    knee  . LEFT HEART CATH AND CORONARY ANGIOGRAPHY N/A 11/01/2018   Procedure: LEFT HEART CATH AND CORONARY ANGIOGRAPHY;  Surgeon: Yvonne Kendall, MD;  Location: [facility] INVASIVE CV LAB;  Service: Cardiovascular;  Laterality: N/A;  . PERMANENT PACEMAKER INSERTION N/A 11/01/2014   Procedure: PERMANENT PACEMAKER INSERTION;  Surgeon: Marinus Maw, MD;  Location: [facility] CATH LAB;   Service: Cardiovascular;  Laterality: N/A;  . stomach stapled  1985  . TOTAL KNEE ARTHROPLASTY Left 11/05/2015   Procedure: TOTAL KNEE ARTHROPLASTY;  Surgeon: Dannielle Huh, MD;  Location: [facility] OR;  Service: Orthopedics;  Laterality: Left;    Current Medications: Current Meds  Medication Sig  . apixaban (ELIQUIS) 5 MG TABS tablet Take 1 tablet (5 mg total) by mouth 2 (two) times daily.  . carvedilol (COREG CR) 40 MG 24 hr capsule Take 40 mg by mouth daily.  . cloNIDine (CATAPRES) 0.2 MG tablet Take 0.2 mg by mouth as directed. Take 1/2 tablet at bedtime  . ferrous sulfate 325 (65 FE) MG tablet Take 325 mg by mouth daily with breakfast.  . hydrALAZINE (APRESOLINE) 50 MG tablet Take 50 mg by mouth 3 (three) times daily.  Marland Kitchen levothyroxine (SYNTHROID, LEVOTHROID) 150 MCG tablet Take 150 mcg by mouth. Take 1 tablet by mouth and take an extra 1/2 tablet on two days  . mirtazapine (REMERON) 7.5 MG tablet TAKE 1 TABLET BY MOUTH AT BEDTIME FOR DEPRESSION/SLEEP  . Multiple Vitamin (MULTIVITAMIN WITH MINERALS) TABS tablet Take 1 tablet by mouth daily.  Marland Kitchen olmesartan (BENICAR) 40 MG tablet Take 1 tablet (40 mg total) by mouth daily.  . pantoprazole (PROTONIX) 40 MG tablet Take 40 mg by mouth daily.  . rosuvastatin (CRESTOR) 40 MG tablet TAKE 1 TABLET BY MOUTH DAILY  . spironolactone (ALDACTONE) 25 MG tablet Take 1 tablet (25 mg total) by mouth daily.  Marland Kitchen VITAMIN D, CHOLECALCIFEROL, PO Take 1 tablet by mouth daily.  . [DISCONTINUED] Ferrous Sulfate (IRON) 142 (45 Fe) MG TBCR Take by mouth.     Allergies:   Amlodipine   Social History   Socioeconomic History  . Marital status: Married    Spouse name: Not on file  . Number of children: Not on file  . Years of education: Not on file  . Highest education level: Not on file  Occupational History  . Not on file  Tobacco Use  . Smoking status: Never Smoker  . Smokeless tobacco: Never Used  Substance and Sexual Activity  . Alcohol use: Yes     Alcohol/week: 1.0 standard drinks    Types: 1 Glasses of wine per week  . Drug use: No  . Sexual activity: Not Currently    Birth control/protection: Surgical  Other Topics Concern  . Not on file  Social History Narrative  . Not on file   Social Determinants of Health   Financial Resource Strain:   . Difficulty of Paying Living Expenses:   Food Insecurity:   . Worried About Programme researcher, broadcasting/film/video in the Last Year:   . Barista in the Last Year:   Transportation Needs:   .  Lack of Transportation (Medical):   Marland Kitchen Lack of Transportation (Non-Medical):   Physical Activity:   . Days of Exercise per Week:   . Minutes of Exercise per Session:   Stress:   . Feeling of Stress :   Social Connections:   . Frequency of Communication with Friends and Family:   . Frequency of Social Gatherings with Friends and Family:   . Attends Religious Services:   . Active Member of Clubs or Organizations:   . Attends Archivist Meetings:   Marland Kitchen Marital Status:      Family History: The patient's family history includes Heart attack in her father; Heart disease in her father; Stroke in her sister.  ROS:   Please see the history of present illness.     All other systems reviewed and are negative.  EKGs/Labs/Other Studies Reviewed:    The following studies were reviewed today:   EKG:  EKG is not ordered today.  The ekg ordered 03/15/2020 demonstrates AV paced  Recent Labs: No results found for requested labs within last 8760 hours.  Recent Lipid Panel    Component Value Date/Time   CHOL 122 10/30/2018 1852   TRIG 51 10/30/2018 1852   HDL 52 10/30/2018 1852   CHOLHDL 2.3 10/30/2018 1852   VLDL 10 10/30/2018 1852   LDLCALC 60 10/30/2018 1852    Physical Exam:    VS:  BP 132/64 (BP Location: Left Arm, Patient Position: Sitting, Cuff Size: Normal)   Pulse 72   Temp (!) 97.2 F (36.2 C)   Ht 5\' 5"  (1.651 m)   Wt 189 lb (85.7 kg)   BMI 31.45 kg/m     Wt Readings from Last  3 Encounters:  05/16/20 189 lb (85.7 kg)  03/15/20 193 lb (87.5 kg)  01/23/20 198 lb (89.8 kg)     GEN:  Well nourished, well developed in no acute distress HEENT: Normal NECK: No JVD; No carotid bruits CARDIAC: RRR, no murmurs, rubs, gallops RESPIRATORY:  Clear to auscultation without rales, wheezing or rhonchi  ABDOMEN: Soft, non-tender, non-distended MUSCULOSKELETAL:  No edema; No deformity  SKIN: Warm and dry NEUROLOGIC:  Alert and oriented x 3 PSYCHIATRIC:  Normal affect   ASSESSMENT:    Essential hypertension Renal dopplers negative for RAS Aug 2020. B/P by me with large cuff was 152/78.  Her readings look good at home, only change suggested was to take Aldactone in the am. She will continue to monitor her B/P at home.  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   PLAN:    Take Aldactone in AM.  Continue to monitor B/P at home (does not need to take it BID- can change to 3-4 x week).     Medication Adjustments/Labs and Tests Ordered: Current medicines are reviewed at length with the patient today.  Concerns regarding medicines are outlined above.  No orders of the defined types were placed in this encounter.  No orders of the defined types were placed in this encounter.   Patient Instructions  Medication Instructions:   Change Spironolactone---take in the morning.  *If you need  a refill on your cardiac medications before your next appointment, please call your pharmacy*   Follow-Up: At [facility], you and  your health needs are our priority.  As part of our continuing mission to provide you with exceptional heart care, we have created designated Provider Care Teams.  These Care Teams include your primary Cardiologist (physician) and Advanced Practice Providers (APPs -  Physician Assistants and Nurse Practitioners) who all work together to provide you with the care you need, when you need it.  We recommend signing up for the patient portal called "MyChart".  Sign up information is provided on this After Visit Summary.  MyChart is used to connect with patients for Virtual Visits (Telemedicine).  Patients are able to view lab/test results, encounter notes, upcoming appointments, etc.  Non-urgent messages can be sent to your provider as well.   To learn more about what you can do with MyChart, go to [email or web address].    Your next appointment:   6 month(s)  The format for your next appointment:   In Person  Provider:   You may see Chilton Si, MD or one of the following Advanced Practice Providers on your designated Care Team:    Corine Shelter, PA-C  [place], [place]  Edd Fabian, [place]    Other Instructions Your physician has requested that you regularly monitor and record your blood pressure readings at home, 2-3 times weekly. Please call our office at [phone] if your blood pressure is consistently 140/85 or above.  Please call our office 2 months in advance to schedule your follow-up appointment with Dr. Duke Salvia.       Signed, Corine Shelter, PA-C  05/16/2020 2:52 PM    [facility]

## 2020-05-16 NOTE — Assessment & Plan Note (Signed)
50-60% mCFX at cath Nov 2019 after positive stress

## 2020-05-16 NOTE — Assessment & Plan Note (Signed)
H/O PAF- SSS

## 2020-05-16 NOTE — Assessment & Plan Note (Signed)
Etiology not yet determined- colonoscopy and endoscopy negative (done 11/23/18).

## 2020-05-16 NOTE — Assessment & Plan Note (Signed)
Eliquis- CHADS VASC=5 

## 2020-05-16 NOTE — Assessment & Plan Note (Signed)
Chronic renal insufficiency- 3b renal dopplers showed not hydronephrosis PCP follows

## 2020-05-16 NOTE — Assessment & Plan Note (Signed)
Renal dopplers negative for RAS Aug 2020. B/P by me with large cuff was 152/78.  Her readings look good at home, only change suggested was to take Aldactone in the am. She will continue to monitor her B/P at home.

## 2020-05-26 ENCOUNTER — Other Ambulatory Visit: Payer: Self-pay

## 2020-05-26 ENCOUNTER — Ambulatory Visit (HOSPITAL_COMMUNITY)
Admission: EM | Admit: 2020-05-26 | Discharge: 2020-05-26 | Disposition: A | Discharge status: Home / Self Care | Payer: Medicare PPO

## 2020-05-26 ENCOUNTER — Emergency Department (HOSPITAL_COMMUNITY): Payer: Medicare PPO

## 2020-05-26 ENCOUNTER — Encounter (HOSPITAL_COMMUNITY): Payer: Self-pay | Admitting: Pharmacy Technician

## 2020-05-26 ENCOUNTER — Emergency Department (HOSPITAL_COMMUNITY)
Admission: EM | Admit: 2020-05-26 | Discharge: 2020-05-26 | Disposition: A | Discharge status: Home / Self Care | Payer: Medicare PPO | Attending: Emergency Medicine | Admitting: Emergency Medicine

## 2020-05-26 DIAGNOSIS — Y939 Activity, unspecified: Secondary | ICD-10-CM | POA: Insufficient documentation

## 2020-05-26 DIAGNOSIS — E039 Hypothyroidism, unspecified: Secondary | ICD-10-CM | POA: Diagnosis not present

## 2020-05-26 DIAGNOSIS — W19XXXA Unspecified fall, initial encounter: Secondary | ICD-10-CM | POA: Insufficient documentation

## 2020-05-26 DIAGNOSIS — Z7901 Long term (current) use of anticoagulants: Secondary | ICD-10-CM | POA: Diagnosis not present

## 2020-05-26 DIAGNOSIS — I10 Essential (primary) hypertension: Secondary | ICD-10-CM | POA: Diagnosis not present

## 2020-05-26 DIAGNOSIS — Y999 Unspecified external cause status: Secondary | ICD-10-CM | POA: Insufficient documentation

## 2020-05-26 DIAGNOSIS — S0003XA Contusion of scalp, initial encounter: Secondary | ICD-10-CM | POA: Insufficient documentation

## 2020-05-26 DIAGNOSIS — Y929 Unspecified place or not applicable: Secondary | ICD-10-CM | POA: Insufficient documentation

## 2020-05-26 DIAGNOSIS — I2510 Atherosclerotic heart disease of native coronary artery without angina pectoris: Secondary | ICD-10-CM | POA: Diagnosis not present

## 2020-05-26 DIAGNOSIS — S0990XA Unspecified injury of head, initial encounter: Secondary | ICD-10-CM | POA: Diagnosis present

## 2020-05-26 DIAGNOSIS — Z79899 Other long term (current) drug therapy: Secondary | ICD-10-CM | POA: Insufficient documentation

## 2020-05-26 DIAGNOSIS — Z950 Presence of cardiac pacemaker: Secondary | ICD-10-CM | POA: Insufficient documentation

## 2020-05-26 NOTE — ED Notes (Signed)
Patient Alert and oriented to baseline. Stable and ambulatory to baseline. Patient verbalized understanding of the discharge instructions.  Patient belongings were taken by the patient.   

## 2020-05-26 NOTE — ED Provider Notes (Signed)
[facility] EMERGENCY DEPARTMENT Provider Note   CSN: [id number] Arrival date & time: 05/26/20  1133     History Chief Complaint  Patient presents with  . Fall    Lauren Henry is a 79 y.o. female.  Patient with fall prior to arrival.  Mechanical fall hit the back of her head.  On Eliquis.  No loss of consciousness.  No neck pain.  No extremity pain.  Was ambulatory after the fall.  The history is provided by the patient.  Fall This is a new problem. The current episode started less than 1 hour ago. The problem has been resolved. Associated symptoms include headaches. Pertinent negatives include no chest pain, no abdominal pain and no shortness of breath. Nothing aggravates the symptoms. Nothing relieves the symptoms. She has tried nothing for the symptoms. The treatment provided no relief.       Past Medical History:  Diagnosis Date  . Arthritis   . Atrial fibrillation (HCC)   . Diarrhea    since gallbladder removed  . 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 History of blood transfusion as a child   no abnormal reaction noted  . History of colon polyps   . Hyperlipidemia    takes Pravastatin daily  . Hypertension    takes Azor and Metoprolol daily  . Hypothyroidism    takes Synthroid daily  . Joint pain   . Presence of permanent cardiac pacemaker   . Urinary frequency   . Urinary urgency     Patient Active Problem List   Diagnosis Date Noted  . Chronic anticoagulation 11/24/2018  . CAD in native artery 11/24/2018  . Chronic renal disease, stage III 11/24/2018  . Anemia 11/24/2018  . Elevated troponin   . Urinary urgency   . Urinary frequency   . Presence of permanent cardiac pacemaker   . Joint pain   . History of colon polyps   . History of blood transfusion   . Diarrhea   . Arthritis   . 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 Preop cardiovascular exam 10/18/2015  .  Chest pain 11/09/2014  . PAF (paroxysmal atrial fibrillation) (HCC) 11/09/2014  . Essential hypertension 10/30/2014  . Hyperlipidemia 10/30/2014  . Hypothyroidism 10/30/2014  . CHB (complete heart block) (HCC) 10/30/2014  . 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 Right-sided low back pain with right-sided sciatica 09/14/2014  . Knee pain 01/31/2013  . Back pain 08/27/2012  . Right groin pain 01/13/2012    Past Surgical History:  Procedure Laterality Date  . ABDOMINAL HYSTERECTOMY    . cataract surgery Bilateral   . CHOLECYSTECTOMY  1971  . COLONOSCOPY    . EYE SURGERY    . INSERT / REPLACE / REMOVE PACEMAKER     Medtronic PPM 11/01/14 (Dr. Lewayne Bunting)  . JOINT REPLACEMENT Right    knee  . LEFT HEART CATH AND CORONARY ANGIOGRAPHY N/A 11/01/2018   Procedure: LEFT HEART CATH AND CORONARY ANGIOGRAPHY;  Surgeon: Yvonne Kendall, MD;  [facility]: [facility] INVASIVE CV LAB;  Service: Cardiovascular;  Laterality: N/A;  . PERMANENT PACEMAKER INSERTION N/A 11/01/2014   Procedure: PERMANENT PACEMAKER INSERTION;  Surgeon: Marinus Maw, MD;  Location: [facility] CATH LAB;  Service: Cardiovascular;  Laterality: N/A;  . stomach stapled  1985  . TOTAL KNEE ARTHROPLASTY Left 11/05/2015   Procedure: TOTAL KNEE ARTHROPLASTY;  Surgeon: Dannielle Huh, MD;  Location:  [facility] OR;  Service: Orthopedics;  Laterality: Left;     OB History   No obstetric history on file.     Family History  Problem Relation Age of Onset  . Heart disease Father   . Heart attack Father   . Stroke Sister     Social History   Tobacco Use  . Smoking status: Never Smoker  . Smokeless tobacco: Never Used  Substance Use Topics  . Alcohol use: Yes    Alcohol/week: 1.0 standard drinks    Types: 1 Glasses of wine per week  . Drug use: No    Home Medications Prior to Admission medications   Medication Sig Start Date End Date Taking? Authorizing Provider  apixaban (ELIQUIS) 5 MG TABS tablet Take 1 tablet (5 mg total) by  mouth 2 (two) times daily. 02/17/20  Yes Chilton Si, MD  carvedilol (COREG CR) 40 MG 24 hr capsule Take 40 mg by mouth daily.   Yes [provider]  cloNIDine (CATAPRES) 0.2 MG tablet Take 0.2 mg by mouth as directed. Take 1/2 tablet at bedtime   Yes [provider]  ferrous sulfate 325 (65 FE) MG tablet Take 325 mg by mouth daily with breakfast.   Yes [provider]  hydrALAZINE (APRESOLINE) 50 MG tablet Take 50 mg by mouth 3 (three) times daily.   Yes [provider]  levothyroxine (SYNTHROID, LEVOTHROID) 150 MCG tablet Take 150 mcg by mouth daily before breakfast.  10/15/17  Yes [provider]  mirtazapine (REMERON) 7.5 MG tablet TAKE 1 TABLET BY MOUTH AT BEDTIME FOR DEPRESSION/SLEEP 11/03/18  Yes [provider]  Multiple Vitamin (MULTIVITAMIN WITH MINERALS) TABS tablet Take 1 tablet by mouth daily.   Yes [provider]  olmesartan (BENICAR) 40 MG tablet Take 1 tablet (40 mg total) by mouth daily. 03/15/20  Yes Chilton Si, MD  pantoprazole (PROTONIX) 40 MG tablet Take 40 mg by mouth daily. 11/10/18  Yes [provider]  rosuvastatin (CRESTOR) 40 MG tablet TAKE 1 TABLET BY MOUTH DAILY 12/27/19  Yes Chilton Si, MD  spironolactone (ALDACTONE) 25 MG tablet Take 1 tablet (25 mg total) by mouth daily. 03/15/20  Yes Chilton Si, MD  VITAMIN D, CHOLECALCIFEROL, PO Take 1 tablet by mouth daily.   Yes [provider]    Allergies    Amlodipine  Review of Systems   Review of Systems  Constitutional: Negative for chills and fever.  HENT: Negative for ear pain and sore throat.   Eyes: Negative for pain and visual disturbance.  Respiratory: Negative for cough and shortness of breath.   Cardiovascular: Negative for chest pain and palpitations.  Gastrointestinal: Negative for abdominal pain and vomiting.  Genitourinary: Negative for dysuria and hematuria.  Musculoskeletal: Negative for arthralgias  and back pain.  Skin: Negative for color change and rash.  Neurological: Positive for headaches. Negative for seizures and syncope.  All other systems reviewed and are negative.   Physical Exam Updated Vital Signs  ED Triage Vitals  Enc Vitals Group     BP 05/26/20 1142 (!) 200/86     Pulse Rate 05/26/20 1145 67     Resp 05/26/20 1148 16     Temp 05/26/20 1142 98 F (36.7 C)     Temp Source 05/26/20 1142 Temporal     SpO2 05/26/20 1145 96 %     Weight 05/26/20 1144 188 lb 15 oz (85.7 kg)     Height 05/26/20 1144 5\' 5"  (1.651 m)  Head Circumference --      Peak Flow --      Pain Score 05/26/20 1146 3     Pain Loc --      Pain Edu? --      Excl. in [place]? --     Physical Exam Vitals and nursing note reviewed.  Constitutional:      General: She is not in acute distress.    Appearance: She is well-developed. She is not ill-appearing.  HENT:     Head:     Comments: Posterior scalp hematoma    Nose: Nose normal.     Mouth/Throat:     Mouth: Mucous membranes are moist.  Eyes:     Conjunctiva/sclera: Conjunctivae normal.     Pupils: Pupils are equal, round, and reactive to light.  Cardiovascular:     Rate and Rhythm: Normal rate and regular rhythm.     Pulses: Normal pulses.     Heart sounds: Normal heart sounds. No murmur.  Pulmonary:     Effort: Pulmonary effort is normal. No respiratory distress.     Breath sounds: Normal breath sounds.  Abdominal:     Palpations: Abdomen is soft.     Tenderness: There is no abdominal tenderness.  Musculoskeletal:        General: No tenderness. Normal range of motion.     Cervical back: Normal range of motion and neck supple.     Comments: No midline spinal tenderness  Skin:    General: Skin is warm and dry.     Capillary Refill: Capillary refill takes less than 2 seconds.  Neurological:     General: No focal deficit present.     Mental Status: She is alert and oriented to person, place, and time.     Cranial Nerves: No  cranial nerve deficit.     Sensory: No sensory deficit.     Motor: No weakness.     Coordination: Coordination normal.     Comments: 5+ out of 5 strength throughout, normal sensation, no drift, normal finger-to-nose finger, normal speech     ED Results / Procedures / Treatments   Labs (all labs ordered are listed, but only abnormal results are displayed) Labs Reviewed - No data to display  EKG EKG Interpretation  Date/Time:  Saturday May 26 2020 11:47:56 EDT Ventricular Rate:  70 PR Interval:    QRS Duration: 179 QT Interval:  482 QTC Calculation: 521 R Axis:   -73 Text Interpretation: Sinus tachycardia Multiform ventricular premature complexes Nonspecific IVCD with LAD Left ventricular hypertrophy Confirmed by Lennice Sites [phone]) on 05/26/2020 11:56:52 AM   Radiology CT Head Wo Contrast  Result Date: 05/26/2020 CLINICAL DATA:  Head trauma, fall on blood thinners EXAM: CT HEAD WITHOUT CONTRAST CT CERVICAL SPINE WITHOUT CONTRAST TECHNIQUE: Multidetector CT imaging of the head and cervical spine was performed following the standard protocol without intravenous contrast. Multiplanar CT image reconstructions of the cervical spine were also generated. COMPARISON:  10/30/2018 FINDINGS: CT HEAD FINDINGS Brain: No evidence of acute infarction, hemorrhage, hydrocephalus, extra-axial collection or mass lesion/mass effect. Vascular: No hyperdense vessel or unexpected calcification. Skull: Normal. Negative for fracture or focal lesion. Sinuses/Orbits: Mucosal thickening of the right maxillary sinus with a calcified sinolith or polyp and bony thickening of the sinus wall. Other: Soft tissue laceration and hematoma of the left scalp vertex. CT CERVICAL SPINE FINDINGS Alignment: Normal. Skull base and vertebrae: No acute fracture. No primary bone lesion or focal pathologic process. Soft tissues and spinal  canal: No prevertebral fluid or swelling. No visible canal hematoma. Disc levels: No fracture or  static subluxation of the cervical spine. Upper chest: Negative. Other: None. IMPRESSION: 1. No acute intracranial pathology. 2. Soft tissue laceration and hematoma of the left scalp vertex. 3. No fracture or static subluxation of the cervical spine. Electronically Signed   By: Lauralyn Primes M.D.   On: 05/26/2020 12:28   CT Cervical Spine Wo Contrast  Result Date: 05/26/2020 CLINICAL DATA:  Head trauma, fall on blood thinners EXAM: CT HEAD WITHOUT CONTRAST CT CERVICAL SPINE WITHOUT CONTRAST TECHNIQUE: Multidetector CT imaging of the head and cervical spine was performed following the standard protocol without intravenous contrast. Multiplanar CT image reconstructions of the cervical spine were also generated. COMPARISON:  10/30/2018 FINDINGS: CT HEAD FINDINGS Brain: No evidence of acute infarction, hemorrhage, hydrocephalus, extra-axial collection or mass lesion/mass effect. Vascular: No hyperdense vessel or unexpected calcification. Skull: Normal. Negative for fracture or focal lesion. Sinuses/Orbits: Mucosal thickening of the right maxillary sinus with a calcified sinolith or polyp and bony thickening of the sinus wall. Other: Soft tissue laceration and hematoma of the left scalp vertex. CT CERVICAL SPINE FINDINGS Alignment: Normal. Skull base and vertebrae: No acute fracture. No primary bone lesion or focal pathologic process. Soft tissues and spinal canal: No prevertebral fluid or swelling. No visible canal hematoma. Disc levels: No fracture or static subluxation of the cervical spine. Upper chest: Negative. Other: None. IMPRESSION: 1. No acute intracranial pathology. 2. Soft tissue laceration and hematoma of the left scalp vertex. 3. No fracture or static subluxation of the cervical spine. Electronically Signed   By: Lauralyn Primes M.D.   On: 05/26/2020 12:28    Procedures Procedures (including critical care time)  Medications Ordered in ED Medications - No data to display  ED Course  I have reviewed  the triage vital signs and the nursing notes.  Pertinent labs & imaging results that were available during my care of the patient were reviewed by me and considered in my medical decision making (see chart for details).    MDM Rules/Calculators/A&P                      HADEN CAVENAUGH is a 79 year old female with history of high cholesterol, high blood pressure, A. fib on Eliquis who presents to the ED after mechanical fall.  Unremarkable vitals.  Larey Seat and hit the back of her head.  No loss of consciousness.  Has large scalp hematoma.  Neurovascularly neuromuscularly and neurologically she is intact throughout.  No extremity tenderness.  Well-appearing.  CT of her head and neck are unremarkable except for scalp hematoma.  No overlying head laceration.  Recommend ice, Tylenol.  Educated about concussions.  Overall appeared well and was able to ambulate without any issues.  Given return precautions and discharged in ED in good condition.  This chart was dictated using voice recognition software.  Despite best efforts to proofread,  errors can occur which can change the documentation meaning.    Final Clinical Impression(s) / ED Diagnoses Final diagnoses:  Contusion of scalp, initial encounter    Rx / DC Orders ED Discharge Orders    None       Virgina Norfolk, DO 05/26/20 1317

## 2020-05-26 NOTE — Progress Notes (Signed)
Orthopedic Tech Progress Note Patient Details:  Lauren Henry [birth year] [id number]  Level 2 Trauma  Lauren Henry 05/26/2020, 12:18 PM

## 2020-05-26 NOTE — ED Notes (Signed)
Pt transported to CT ?

## 2020-05-26 NOTE — ED Triage Notes (Signed)
Pt arrives pov with reports of falling off of a box backward onto concrete and hitting her head this morning. Pt denies LOC. Alert and oriented. Level 2 trauma activated. Pt on eliquis.

## 2020-05-26 NOTE — ED Notes (Signed)
CT notified pt ready for scan 

## 2020-06-26 ENCOUNTER — Other Ambulatory Visit: Payer: Self-pay | Admitting: Cardiovascular Disease

## 2020-08-03 DIAGNOSIS — E039 Hypothyroidism, unspecified: Secondary | ICD-10-CM | POA: Diagnosis not present

## 2020-08-03 DIAGNOSIS — E7800 Pure hypercholesterolemia, unspecified: Secondary | ICD-10-CM | POA: Diagnosis not present

## 2020-08-03 DIAGNOSIS — E559 Vitamin D deficiency, unspecified: Secondary | ICD-10-CM | POA: Diagnosis not present

## 2020-08-07 ENCOUNTER — Ambulatory Visit (INDEPENDENT_AMBULATORY_CARE_PROVIDER_SITE_OTHER): Payer: Medicare PPO | Admitting: *Deleted

## 2020-08-07 DIAGNOSIS — I442 Atrioventricular block, complete: Secondary | ICD-10-CM | POA: Diagnosis not present

## 2020-08-07 LAB — CUP PACEART REMOTE DEVICE CHECK
Battery Impedance: 405 Ohm
Battery Remaining Longevity: 90 mo
Battery Voltage: 2.79 V
Brady Statistic AP VP Percent: 35 %
Brady Statistic AP VS Percent: 0 %
Brady Statistic AS VP Percent: 65 %
Brady Statistic AS VS Percent: 0 %
Date Time Interrogation Session: 20210817081506
Implantable Lead Implant Date: 20151111
Implantable Lead Implant Date: 20151111
Implantable Lead Location: 753859
Implantable Lead Location: 753860
Implantable Lead Model: 5076
Implantable Lead Model: 5076
Implantable Pulse Generator Implant Date: 20151111
Lead Channel Impedance Value: 436 Ohm
Lead Channel Impedance Value: 584 Ohm
Lead Channel Pacing Threshold Amplitude: 0.5 V
Lead Channel Pacing Threshold Amplitude: 1.75 V
Lead Channel Pacing Threshold Pulse Width: 0.4 ms
Lead Channel Pacing Threshold Pulse Width: 0.4 ms
Lead Channel Setting Pacing Amplitude: 1.5 V
Lead Channel Setting Pacing Amplitude: 2.5 V
Lead Channel Setting Pacing Pulse Width: 0.46 ms
Lead Channel Setting Sensing Sensitivity: 2 mV

## 2020-08-09 NOTE — Progress Notes (Signed)
Remote pacemaker transmission.   

## 2020-08-10 DIAGNOSIS — Z0000 Encounter for general adult medical examination without abnormal findings: Secondary | ICD-10-CM | POA: Diagnosis not present

## 2020-08-10 DIAGNOSIS — R82998 Other abnormal findings in urine: Secondary | ICD-10-CM | POA: Diagnosis not present

## 2020-08-10 DIAGNOSIS — I480 Paroxysmal atrial fibrillation: Secondary | ICD-10-CM | POA: Diagnosis not present

## 2020-08-10 DIAGNOSIS — I1310 Hypertensive heart and chronic kidney disease without heart failure, with stage 1 through stage 4 chronic kidney disease, or unspecified chronic kidney disease: Secondary | ICD-10-CM | POA: Diagnosis not present

## 2020-08-10 DIAGNOSIS — N184 Chronic kidney disease, stage 4 (severe): Secondary | ICD-10-CM | POA: Diagnosis not present

## 2020-08-10 DIAGNOSIS — Z1212 Encounter for screening for malignant neoplasm of rectum: Secondary | ICD-10-CM | POA: Diagnosis not present

## 2020-08-10 DIAGNOSIS — D6869 Other thrombophilia: Secondary | ICD-10-CM | POA: Diagnosis not present

## 2020-08-10 DIAGNOSIS — I442 Atrioventricular block, complete: Secondary | ICD-10-CM | POA: Diagnosis not present

## 2020-08-10 DIAGNOSIS — D631 Anemia in chronic kidney disease: Secondary | ICD-10-CM | POA: Diagnosis not present

## 2020-08-10 DIAGNOSIS — E7800 Pure hypercholesterolemia, unspecified: Secondary | ICD-10-CM | POA: Diagnosis not present

## 2020-08-10 DIAGNOSIS — Z7901 Long term (current) use of anticoagulants: Secondary | ICD-10-CM | POA: Diagnosis not present

## 2020-08-17 DIAGNOSIS — Z7901 Long term (current) use of anticoagulants: Secondary | ICD-10-CM | POA: Diagnosis not present

## 2020-08-17 DIAGNOSIS — R112 Nausea with vomiting, unspecified: Secondary | ICD-10-CM | POA: Diagnosis not present

## 2020-08-17 DIAGNOSIS — R197 Diarrhea, unspecified: Secondary | ICD-10-CM | POA: Diagnosis not present

## 2020-08-17 DIAGNOSIS — N184 Chronic kidney disease, stage 4 (severe): Secondary | ICD-10-CM | POA: Diagnosis not present

## 2020-08-17 DIAGNOSIS — I1310 Hypertensive heart and chronic kidney disease without heart failure, with stage 1 through stage 4 chronic kidney disease, or unspecified chronic kidney disease: Secondary | ICD-10-CM | POA: Diagnosis not present

## 2020-08-17 DIAGNOSIS — I480 Paroxysmal atrial fibrillation: Secondary | ICD-10-CM | POA: Diagnosis not present

## 2020-08-20 ENCOUNTER — Other Ambulatory Visit: Payer: Self-pay

## 2020-08-20 ENCOUNTER — Observation Stay (HOSPITAL_COMMUNITY)
Admission: EM | Admit: 2020-08-20 | Discharge: 2020-08-22 | Disposition: A | Discharge status: Home / Self Care | Payer: Medicare PPO | Attending: Internal Medicine | Admitting: Internal Medicine

## 2020-08-20 ENCOUNTER — Encounter (HOSPITAL_COMMUNITY): Payer: Self-pay

## 2020-08-20 DIAGNOSIS — I480 Paroxysmal atrial fibrillation: Secondary | ICD-10-CM | POA: Diagnosis present

## 2020-08-20 DIAGNOSIS — E860 Dehydration: Secondary | ICD-10-CM | POA: Diagnosis not present

## 2020-08-20 DIAGNOSIS — N179 Acute kidney failure, unspecified: Secondary | ICD-10-CM | POA: Diagnosis not present

## 2020-08-20 DIAGNOSIS — E039 Hypothyroidism, unspecified: Secondary | ICD-10-CM | POA: Diagnosis present

## 2020-08-20 DIAGNOSIS — N1832 Chronic kidney disease, stage 3b: Secondary | ICD-10-CM | POA: Diagnosis present

## 2020-08-20 DIAGNOSIS — Z96653 Presence of artificial knee joint, bilateral: Secondary | ICD-10-CM | POA: Insufficient documentation

## 2020-08-20 DIAGNOSIS — N184 Chronic kidney disease, stage 4 (severe): Secondary | ICD-10-CM | POA: Diagnosis not present

## 2020-08-20 DIAGNOSIS — I1310 Hypertensive heart and chronic kidney disease without heart failure, with stage 1 through stage 4 chronic kidney disease, or unspecified chronic kidney disease: Secondary | ICD-10-CM | POA: Diagnosis not present

## 2020-08-20 DIAGNOSIS — E785 Hyperlipidemia, unspecified: Secondary | ICD-10-CM | POA: Diagnosis present

## 2020-08-20 DIAGNOSIS — I10 Essential (primary) hypertension: Secondary | ICD-10-CM | POA: Diagnosis not present

## 2020-08-20 DIAGNOSIS — Z20822 Contact with and (suspected) exposure to covid-19: Secondary | ICD-10-CM | POA: Insufficient documentation

## 2020-08-20 DIAGNOSIS — I2510 Atherosclerotic heart disease of native coronary artery without angina pectoris: Secondary | ICD-10-CM | POA: Diagnosis present

## 2020-08-20 DIAGNOSIS — Z79899 Other long term (current) drug therapy: Secondary | ICD-10-CM | POA: Insufficient documentation

## 2020-08-20 DIAGNOSIS — I129 Hypertensive chronic kidney disease with stage 1 through stage 4 chronic kidney disease, or unspecified chronic kidney disease: Secondary | ICD-10-CM | POA: Diagnosis not present

## 2020-08-20 DIAGNOSIS — N183 Chronic kidney disease, stage 3 unspecified: Secondary | ICD-10-CM | POA: Diagnosis not present

## 2020-08-20 DIAGNOSIS — I119 Hypertensive heart disease without heart failure: Secondary | ICD-10-CM | POA: Insufficient documentation

## 2020-08-20 DIAGNOSIS — Z950 Presence of cardiac pacemaker: Secondary | ICD-10-CM | POA: Insufficient documentation

## 2020-08-20 DIAGNOSIS — R197 Diarrhea, unspecified: Secondary | ICD-10-CM | POA: Diagnosis not present

## 2020-08-20 DIAGNOSIS — R112 Nausea with vomiting, unspecified: Secondary | ICD-10-CM | POA: Diagnosis not present

## 2020-08-20 DIAGNOSIS — R7989 Other specified abnormal findings of blood chemistry: Secondary | ICD-10-CM | POA: Diagnosis present

## 2020-08-20 LAB — COMPREHENSIVE METABOLIC PANEL
ALT: 18 U/L (ref 0–44)
AST: 17 U/L (ref 15–41)
Albumin: 4 g/dL (ref 3.5–5.0)
Alkaline Phosphatase: 58 U/L (ref 38–126)
Anion gap: 14 (ref 5–15)
BUN: 45 mg/dL — ABNORMAL HIGH (ref 8–23)
CO2: 13 mmol/L — ABNORMAL LOW (ref 22–32)
Calcium: 9.6 mg/dL (ref 8.9–10.3)
Chloride: 110 mmol/L (ref 98–111)
Creatinine, Ser: 3.22 mg/dL — ABNORMAL HIGH (ref 0.44–1.00)
GFR calc Af Amer: 15 mL/min — ABNORMAL LOW (ref >60)
GFR calc non Af Amer: 13 mL/min — ABNORMAL LOW (ref >60)
Glucose, Bld: 111 mg/dL — ABNORMAL HIGH (ref 70–99)
Potassium: 4.6 mmol/L (ref 3.5–5.1)
Sodium: 137 mmol/L (ref 135–145)
Total Bilirubin: 0.7 mg/dL (ref 0.3–1.2)
Total Protein: 7.3 g/dL (ref 6.5–8.1)

## 2020-08-20 LAB — CBC
HCT: 33.9 % — ABNORMAL LOW (ref 36.0–46.0)
Hemoglobin: 11.1 g/dL — ABNORMAL LOW (ref 12.0–15.0)
MCH: 31.1 pg (ref 26.0–34.0)
MCHC: 32.7 g/dL (ref 30.0–36.0)
MCV: 95 fL (ref 80.0–100.0)
Platelets: 176 10*3/uL (ref 150–400)
RBC: 3.57 MIL/uL — ABNORMAL LOW (ref 3.87–5.11)
RDW: 13.9 % (ref 11.5–15.5)
WBC: 8.1 10*3/uL (ref 4.0–10.5)
nRBC: 0 % (ref 0.0–0.2)

## 2020-08-20 LAB — LIPASE, BLOOD: Lipase: 47 U/L (ref 11–51)

## 2020-08-20 MED ORDER — LACTATED RINGERS IV BOLUS
1000.0000 mL | Freq: Once | INTRAVENOUS | Status: AC
  Administered 2020-08-20: 1000 mL via INTRAVENOUS

## 2020-08-20 NOTE — ED Provider Notes (Signed)
[facility]-EMERGENCY DEPT Provider Note   CSN: [id number] Arrival date & time: 08/20/20  1631     History Chief Complaint  Patient presents with  . Diarrhea  . Emesis  . Abnormal Lab    Lauren Henry is a 79 y.o. female.  Patient presents to the emergency department with a chief complaint of elevated creatinine. She was sent in by her PCP. She reports having had nausea, vomiting, and diarrhea for the past week. She states that the vomiting and diarrhea have eased up today, but when she saw her doctor for recheck, her creatinine was significantly elevated when compared to 3 days ago. For this cause, the patient was sent to the emergency department for further treatment and evaluation. Patient denies any fever, chills, cough. She states that she does feel worn out and fatigued. She denies any known Covid exposures.  The history is provided by the patient. No language interpreter was used.       Past Medical History:  Diagnosis Date  . Arthritis   . Atrial fibrillation (HCC)   . Diarrhea    since gallbladder removed  . 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 History of blood transfusion as a child   no abnormal reaction noted  . History of colon polyps   . Hyperlipidemia    takes Pravastatin daily  . Hypertension    takes Azor and Metoprolol daily  . Hypothyroidism    takes Synthroid daily  . Joint pain   . Presence of permanent cardiac pacemaker   . Urinary frequency   . Urinary urgency     Patient Active Problem List   Diagnosis Date Noted  . Chronic anticoagulation 11/24/2018  . CAD in native artery 11/24/2018  . Chronic renal disease, stage III 11/24/2018  . Anemia 11/24/2018  . Elevated troponin   . Urinary urgency   . Urinary frequency   . Presence of permanent cardiac pacemaker   . Joint pain   . History of colon polyps   . History of blood transfusion   . Diarrhea   . Arthritis     . 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 Preop cardiovascular exam 10/18/2015  . Chest pain 11/09/2014  . PAF (paroxysmal atrial fibrillation) (HCC) 11/09/2014  . Essential hypertension 10/30/2014  . Hyperlipidemia 10/30/2014  . Hypothyroidism 10/30/2014  . CHB (complete heart block) (HCC) 10/30/2014  . 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 Right-sided low back pain with right-sided sciatica 09/14/2014  . Knee pain 01/31/2013  . Back pain 08/27/2012  . Right groin pain 01/13/2012    Past Surgical History:  Procedure Laterality Date  . ABDOMINAL HYSTERECTOMY    . cataract surgery Bilateral   . CHOLECYSTECTOMY  1971  . COLONOSCOPY    . EYE SURGERY    . INSERT / REPLACE / REMOVE PACEMAKER     Medtronic PPM 11/01/14 (Dr. Lewayne Bunting)  . JOINT REPLACEMENT Right    knee  . LEFT HEART CATH AND CORONARY ANGIOGRAPHY N/A 11/01/2018   Procedure: LEFT HEART CATH AND CORONARY ANGIOGRAPHY;  Surgeon: Yvonne Kendall, MD;  Location: [facility] INVASIVE CV LAB;  Service: Cardiovascular;  Laterality: N/A;  . PERMANENT PACEMAKER INSERTION N/A 11/01/2014   Procedure: PERMANENT PACEMAKER INSERTION;  Surgeon: Marinus Maw, MD;  Location: [facility] CATH LAB;  Service: Cardiovascular;  Laterality: N/A;  . stomach stapled  1985  . TOTAL KNEE  ARTHROPLASTY Left 11/05/2015   Procedure: TOTAL KNEE ARTHROPLASTY;  Surgeon: Dannielle Huh, MD;  Location: [facility] OR;  Service: Orthopedics;  Laterality: Left;     OB History   No obstetric history on file.     Family History  Problem Relation Age of Onset  . Heart disease Father   . Heart attack Father   . Stroke Sister     Social History   Tobacco Use  . Smoking status: Never Smoker  . Smokeless tobacco: Never Used  Vaping Use  . Vaping Use: Never used  Substance Use Topics  . Alcohol use: Yes    Alcohol/week: 1.0 standard drink    Types: 1 Glasses of wine per week  . Drug use: No    Home Medications Prior to Admission  medications   Medication Sig Start Date End Date Taking? Authorizing Provider  apixaban (ELIQUIS) 5 MG TABS tablet Take 1 tablet (5 mg total) by mouth 2 (two) times daily. 02/17/20   Chilton Si, MD  carvedilol (COREG CR) 40 MG 24 hr capsule Take 40 mg by mouth daily.    [provider]  cloNIDine (CATAPRES) 0.2 MG tablet TAKE 1 TABLET(0.2MG ) BY MOUTH AT BEDTIME. HOLD IF SYSTOLIC BLOOD PRESSURE LESS THAN 200 06/26/20   Chilton Si, MD  ferrous sulfate 325 (65 FE) MG tablet Take 325 mg by mouth daily with breakfast.    [provider]  hydrALAZINE (APRESOLINE) 50 MG tablet Take 50 mg by mouth 3 (three) times daily.    [provider]  levothyroxine (SYNTHROID, LEVOTHROID) 150 MCG tablet Take 150 mcg by mouth daily before breakfast.  10/15/17   [provider]  mirtazapine (REMERON) 7.5 MG tablet Take 7.5 mg by mouth at bedtime.  11/03/18   [provider]  Multiple Vitamin (MULTIVITAMIN WITH MINERALS) TABS tablet Take 1 tablet by mouth daily.    [provider]  olmesartan (BENICAR) 40 MG tablet Take 1 tablet (40 mg total) by mouth daily. 03/15/20   Chilton Si, MD  pantoprazole (PROTONIX) 40 MG tablet Take 40 mg by mouth daily. 11/10/18   [provider]  rosuvastatin (CRESTOR) 40 MG tablet TAKE 1 TABLET BY MOUTH DAILY 12/27/19   Chilton Si, MD  spironolactone (ALDACTONE) 25 MG tablet Take 1 tablet (25 mg total) by mouth daily. 03/15/20   Chilton Si, MD  vitamin C (ASCORBIC ACID) 250 MG tablet Take 250 mg by mouth daily.    [provider]  VITAMIN D, CHOLECALCIFEROL, PO Take 1 tablet by mouth daily.    [provider]    Allergies    Amlodipine  Review of Systems   Review of Systems  All other systems reviewed and are negative.   Physical Exam Updated Vital Signs BP 133/89 (BP Location: Left Arm)   Pulse 94   Temp 98 F (36.7 C) (Oral)   Resp 15   Ht 5\' 5"  (1.651 m)   Wt 83 kg    SpO2 93%   BMI 30.44 kg/m   Physical Exam Vitals and nursing note reviewed.  Constitutional:      General: She is not in acute distress.    Appearance: She is well-developed.  HENT:     Head: Normocephalic and atraumatic.  Eyes:     Conjunctiva/sclera: Conjunctivae normal.  Cardiovascular:     Rate and Rhythm: Normal rate and regular rhythm.     Heart sounds: No murmur heard.   Pulmonary:     Effort: Pulmonary effort is  normal. No respiratory distress.     Breath sounds: Normal breath sounds.  Abdominal:     Palpations: Abdomen is soft.     Tenderness: There is no abdominal tenderness.  Musculoskeletal:        General: Normal range of motion.     Cervical back: Neck supple.  Skin:    General: Skin is warm and dry.  Neurological:     Mental Status: She is alert and oriented to person, place, and time.  Psychiatric:        Mood and Affect: Mood normal.        Behavior: Behavior normal.     ED Results / Procedures / Treatments   Labs (all labs ordered are listed, but only abnormal results are displayed) Labs Reviewed  COMPREHENSIVE METABOLIC PANEL - Abnormal; Notable for the following components:      Result Value   CO2 13 (*)    Glucose, Bld 111 (*)    BUN 45 (*)    Creatinine, Ser 3.22 (*)    GFR calc non Af Amer 13 (*)    GFR calc Af Amer 15 (*)    All other components within normal limits  CBC - Abnormal; Notable for the following components:   RBC 3.57 (*)    Hemoglobin 11.1 (*)    HCT 33.9 (*)    All other components within normal limits  LIPASE, BLOOD  URINALYSIS, ROUTINE W REFLEX MICROSCOPIC    EKG None  Radiology No results found.  Procedures Procedures (including critical care time)  Medications Ordered in ED Medications - No data to display  ED Course  I have reviewed the triage vital signs and the nursing notes.  Pertinent labs & imaging results that were available during my care of the patient were reviewed by me and considered in  my medical decision making (see chart for details).    MDM Rules/Calculators/A&P                          This patient complains of abnormal lab, n/v/d, this involves an extensive number of treatment options, and is a complaint that carries with it a high risk of complications and morbidity.    Differential Dx Gastro, AKI, dehydration, covid  Pertinent Labs I ordered, reviewed, and interpreted labs, which included CMP notable for creatinine of 3.2, bicarb 13, BUN 45, otherwise fairly reassuring labs.  Creatinine from PCP office on 8/27 was 2.2.  Medications I ordered medication lactated Ringer's for AKI.  Sources Previous records obtained and reviewed show creatinine of 2.2 on Friday.  Reassessments After the interventions stated above, I reevaluated the patient and found agreeable with plan for admission.  Consultants Patient seen by and discussed with Dr. Pilar Plate, who advises admission based on worsening creatinine and low bicarb. Advised starting lactated Ringer's.  Hospitalist- Dr. Mikeal Hawthorne, who is appreciated for admitting the patient.  Plan Admit    Final Clinical Impression(s) / ED Diagnoses Final diagnoses:  Dehydration  AKI (acute kidney injury) [facility])    Rx / DC Orders ED Discharge Orders    None       Roxy Horseman, PA-C 08/20/20 2301    Sabas Sous, MD 08/20/20 [phone]

## 2020-08-20 NOTE — ED Triage Notes (Signed)
Patient c/o N/V/D x 2 weeks. Patient went to her PCP 3 days ago and was told to come to the ED for IV fluids. Patient denies any abdominal pain.

## 2020-08-20 NOTE — H&P (Signed)
History and Physical   Lauren Henry OZH:[id number] DOB: 02-09-1941 DOA: 08/20/2020  Referring MD/NP/PA: Dr. Pilar Plate  PCP: Tisovec, Adelfa Koh, MD   Outpatient Specialists: None  Patient coming from: Home  Chief Complaint: Nausea vomiting diarrhea  HPI: ILAMAE GENG is a 79 y.o. female with medical history significant of atrial fibrillation, hyperlipidemia, hypothyroidism, hypertension, history of cardiac pacemaker who presents with many days of nausea, Vomiting and Diarrhea. She has significantly worsened creatinine compared with few days ago and PCP advises her to come to the ER for assessment. No Covid exposure. Has not had any vomiting since this morning.  Creatinine however is not elevated appears to have acute kidney injury..  ED Course: Temperature 98.3 blood pressure 144/57 pulse 100 respirate 20 oxygen sat 93% on room air.  Sodium 137 potassium 4.3 chloride 110 CO2 of 13 glucose 111 BUN 45 creatinine 3.22 the rest of the chemistry and CBC appear to be within normal.  COVID-19 is negative.  Patient being admitted with acute kidney injury probably prerenal from nausea vomiting and diarrhea  Review of Systems: As per HPI otherwise 10 point review of systems negative.    Past Medical History:  Diagnosis Date  . Arthritis   . Atrial fibrillation (HCC)   . Diarrhea    since gallbladder removed  . 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 History of blood transfusion as a child   no abnormal reaction noted  . History of colon polyps   . Hyperlipidemia    takes Pravastatin daily  . Hypertension    takes Azor and Metoprolol daily  . Hypothyroidism    takes Synthroid daily  . Joint pain   . Presence of permanent cardiac pacemaker   . Urinary frequency   . Urinary urgency     Past Surgical History:  Procedure Laterality Date  . ABDOMINAL HYSTERECTOMY    . cataract surgery Bilateral   . CHOLECYSTECTOMY  1971  . COLONOSCOPY      . EYE SURGERY    . INSERT / REPLACE / REMOVE PACEMAKER     Medtronic PPM 11/01/14 (Dr. Lewayne Bunting)  . JOINT REPLACEMENT Right    knee  . LEFT HEART CATH AND CORONARY ANGIOGRAPHY N/A 11/01/2018   Procedure: LEFT HEART CATH AND CORONARY ANGIOGRAPHY;  Surgeon: Yvonne Kendall, MD;  Location: [facility] INVASIVE CV LAB;  Service: Cardiovascular;  Laterality: N/A;  . PERMANENT PACEMAKER INSERTION N/A 11/01/2014   Procedure: PERMANENT PACEMAKER INSERTION;  Surgeon: Marinus Maw, MD;  Location: [facility] CATH LAB;  Service: Cardiovascular;  Laterality: N/A;  . stomach stapled  1985  . TOTAL KNEE ARTHROPLASTY Left 11/05/2015   Procedure: TOTAL KNEE ARTHROPLASTY;  Surgeon: Dannielle Huh, MD;  Location: [facility] OR;  Service: Orthopedics;  Laterality: Left;     reports that she has never smoked. She has never used smokeless tobacco. She reports current alcohol use of about 1.0 standard drink of alcohol per week. She reports that she does not use drugs.  Allergies  Allergen Reactions  . Amlodipine Swelling and Rash    Family History  Problem Relation Age of Onset  . Heart disease Father   . Heart attack Father   . Stroke Sister      Prior to Admission medications   Medication Sig Start Date End Date Taking? Authorizing Provider  apixaban (ELIQUIS) 5 MG TABS tablet Take 1 tablet (5 mg total) by mouth 2 (two) times daily. 02/17/20  Chilton Si, MD  carvedilol (COREG CR) 40 MG 24 hr capsule Take 40 mg by mouth daily.    [provider]  cloNIDine (CATAPRES) 0.2 MG tablet TAKE 1 TABLET(0.2MG ) BY MOUTH AT BEDTIME. HOLD IF SYSTOLIC BLOOD PRESSURE LESS THAN 200 06/26/20   Chilton Si, MD  ferrous sulfate 325 (65 FE) MG tablet Take 325 mg by mouth daily with breakfast.    [provider]  hydrALAZINE (APRESOLINE) 50 MG tablet Take 50 mg by mouth 3 (three) times daily.    [provider]  levothyroxine (SYNTHROID, LEVOTHROID) 150 MCG tablet Take 150 mcg by mouth daily before  breakfast.  10/15/17   [provider]  mirtazapine (REMERON) 7.5 MG tablet Take 7.5 mg by mouth at bedtime.  11/03/18   [provider]  Multiple Vitamin (MULTIVITAMIN WITH MINERALS) TABS tablet Take 1 tablet by mouth daily.    [provider]  olmesartan (BENICAR) 40 MG tablet Take 1 tablet (40 mg total) by mouth daily. 03/15/20   Chilton Si, MD  pantoprazole (PROTONIX) 40 MG tablet Take 40 mg by mouth daily. 11/10/18   [provider]  rosuvastatin (CRESTOR) 40 MG tablet TAKE 1 TABLET BY MOUTH DAILY 12/27/19   Chilton Si, MD  spironolactone (ALDACTONE) 25 MG tablet Take 1 tablet (25 mg total) by mouth daily. 03/15/20   Chilton Si, MD  vitamin C (ASCORBIC ACID) 250 MG tablet Take 250 mg by mouth daily.    [provider]  VITAMIN D, CHOLECALCIFEROL, PO Take 1 tablet by mouth daily.    [provider]    Physical Exam: Vitals:   08/20/20 1641 08/20/20 1642 08/20/20 2102  BP:  (!) 144/57 133/89  Pulse:  100 94  Resp:  18 15  Temp:  97.7 F (36.5 C) 98 F (36.7 C)  TempSrc:  Oral Oral  SpO2:  98% 93%  Weight: 85.3 kg  83 kg  Height: 5\' 5"  (1.651 m)        Constitutional: Acutely ill looking no distress Vitals:   08/20/20 1641 08/20/20 1642 08/20/20 2102  BP:  (!) 144/57 133/89  Pulse:  100 94  Resp:  18 15  Temp:  97.7 F (36.5 C) 98 F (36.7 C)  TempSrc:  Oral Oral  SpO2:  98% 93%  Weight: 85.3 kg  83 kg  Height: 5\' 5"  (1.651 m)     Eyes: PERRL, lids and conjunctivae normal ENMT: Mucous membranes are dry. Posterior pharynx clear of any exudate or lesions.Normal dentition.  Neck: normal, supple, no masses, no thyromegaly Respiratory: clear to auscultation bilaterally, no wheezing, no crackles. Normal respiratory effort. No accessory muscle use.  Cardiovascular: Sinus tachycardia no murmurs / rubs / gallops. No extremity edema. 2+ pedal pulses. No carotid bruits.  Abdomen: no tenderness, no masses  palpated. No hepatosplenomegaly. Bowel sounds positive.  Musculoskeletal: no clubbing / cyanosis. No joint deformity upper and lower extremities. Good ROM, no contractures. Normal muscle tone.  Skin: no rashes, lesions, ulcers. No induration Neurologic: CN 2-12 grossly intact. Sensation intact, DTR normal. Strength 5/5 in all 4.  Psychiatric: Normal judgment and insight. Alert and oriented x 3. Normal mood.     Labs on Admission: I have personally reviewed following labs and imaging studies  CBC: Recent Labs  Lab 08/20/20 1654  WBC 8.1  HGB 11.1*  HCT 33.9*  MCV 95.0  PLT 176   Basic Metabolic Panel: Recent Labs  Lab 08/20/20 1654  NA 137  K 4.6  CL 110  CO2 13*  GLUCOSE 111*  BUN 45*  CREATININE 3.22*  CALCIUM 9.6   GFR: Estimated Creatinine Clearance: 15.3 mL/min (A) (by C-G formula based on SCr of 3.22 mg/dL (H)). Liver Function Tests: Recent Labs  Lab 08/20/20 1654  AST 17  ALT 18  ALKPHOS 58  BILITOT 0.7  PROT 7.3  ALBUMIN 4.0   Recent Labs  Lab 08/20/20 1654  LIPASE 47   No results for input(s): AMMONIA in the last 168 hours. Coagulation Profile: No results for input(s): INR, PROTIME in the last 168 hours. Cardiac Enzymes: No results for input(s): CKTOTAL, CKMB, CKMBINDEX, TROPONINI in the last 168 hours. BNP (last 3 results) No results for input(s): PROBNP in the last 8760 hours. HbA1C: No results for input(s): HGBA1C in the last 72 hours. CBG: No results for input(s): GLUCAP in the last 168 hours. Lipid Profile: No results for input(s): CHOL, HDL, LDLCALC, TRIG, CHOLHDL, LDLDIRECT in the last 72 hours. Thyroid Function Tests: No results for input(s): TSH, T4TOTAL, FREET4, T3FREE, THYROIDAB in the last 72 hours. Anemia Panel: No results for input(s): VITAMINB12, FOLATE, FERRITIN, TIBC, IRON, RETICCTPCT in the last 72 hours. Urine analysis:    Component Value Date/Time   COLORURINE YELLOW 10/26/2015 0957   APPEARANCEUR CLOUDY (A)  10/26/2015 0957   LABSPEC 1.013 10/26/2015 0957   PHURINE 5.5 10/26/2015 0957   GLUCOSEU NEGATIVE 10/26/2015 0957   HGBUR NEGATIVE 10/26/2015 0957   BILIRUBINUR NEGATIVE 10/26/2015 0957   KETONESUR NEGATIVE 10/26/2015 0957   PROTEINUR NEGATIVE 10/26/2015 0957   UROBILINOGEN 0.2 10/26/2015 0957   NITRITE NEGATIVE 10/26/2015 0957   LEUKOCYTESUR TRACE (A) 10/26/2015 0957   Sepsis Labs: @LABRCNTIP (procalcitonin:4,lacticidven:4) )No results found for this or any previous visit (from the past 240 hour(s)).   Radiological Exams on Admission: No results found.  EKG: Independently reviewed.  No significant EKG change.  Assessment/Plan Principal Problem:   ARF (acute renal failure) (HCC) Active Problems:   Essential hypertension   Hyperlipidemia   Hypothyroidism   PAF (paroxysmal atrial fibrillation) (HCC)   Chronic anticoagulation   CAD in native artery   Chronic renal disease, stage III     #1 acute on chronic renal failure: Patient seems to have AKI from nausea vomiting diarrhea.  We will admit the patient aggressively hydrate.  Sodium bicarb drip.  Avoid nephrotoxic medications.  Follow renal function.  #2 essential hypertension: Continue with blood pressure control.  #3 hypothyroidism: Resume home regimen.  #4 hyperlipidemia: Continue with statin  #5 paroxysmal atrial fibrillation: Continue home regimen with anticoagulation.  #6 chronic kidney disease stage III: As per #1  #7 nausea vomiting and diarrhea: Appears slowing down.  Continue supportive care only   DVT prophylaxis: Eliquis Code Status: Full code Family Communication: No family at bedside Disposition Plan: Home Consults called: None Admission status: Observation  Severity of Illness: The appropriate patient status for this patient is OBSERVATION. Observation status is judged to be reasonable and necessary in order to provide the required intensity of service to ensure the patient's safety. The patient's  presenting symptoms, physical exam findings, and initial radiographic and laboratory data in the context of their medical condition is felt to place them at decreased risk for further clinical deterioration. Furthermore, it is anticipated that the patient will be medically stable for discharge from the hospital within 2 midnights of admission. The following factors support the patient status of observation.   " The patient's presenting symptoms include nausea vomiting diarrhea. " The physical exam  findings include dry mucous membranes. " The initial radiographic and laboratory data are creatinine of more than 3.     Lonia BloodGARBA,LAWAL MD [facility] Pager [phone]  If 7PM-7AM, please contact night-coverage [email or web address] Password [email or web address]  08/20/2020, 11:19 PM

## 2020-08-21 DIAGNOSIS — N179 Acute kidney failure, unspecified: Secondary | ICD-10-CM | POA: Diagnosis not present

## 2020-08-21 DIAGNOSIS — N1832 Chronic kidney disease, stage 3b: Secondary | ICD-10-CM | POA: Diagnosis not present

## 2020-08-21 LAB — COMPREHENSIVE METABOLIC PANEL
ALT: 17 U/L (ref 0–44)
AST: 18 U/L (ref 15–41)
Albumin: 3.3 g/dL — ABNORMAL LOW (ref 3.5–5.0)
Alkaline Phosphatase: 47 U/L (ref 38–126)
Anion gap: 10 (ref 5–15)
BUN: 45 mg/dL — ABNORMAL HIGH (ref 8–23)
CO2: 15 mmol/L — ABNORMAL LOW (ref 22–32)
Calcium: 8.9 mg/dL (ref 8.9–10.3)
Chloride: 113 mmol/L — ABNORMAL HIGH (ref 98–111)
Creatinine, Ser: 2.97 mg/dL — ABNORMAL HIGH (ref 0.44–1.00)
GFR calc Af Amer: 17 mL/min — ABNORMAL LOW (ref >60)
GFR calc non Af Amer: 14 mL/min — ABNORMAL LOW (ref >60)
Glucose, Bld: 83 mg/dL (ref 70–99)
Potassium: 5 mmol/L (ref 3.5–5.1)
Sodium: 138 mmol/L (ref 135–145)
Total Bilirubin: 0.4 mg/dL (ref 0.3–1.2)
Total Protein: 6 g/dL — ABNORMAL LOW (ref 6.5–8.1)

## 2020-08-21 LAB — URINALYSIS, ROUTINE W REFLEX MICROSCOPIC
Bilirubin Urine: NEGATIVE
Glucose, UA: NEGATIVE mg/dL
Ketones, ur: NEGATIVE mg/dL
Nitrite: NEGATIVE
Protein, ur: NEGATIVE mg/dL
Specific Gravity, Urine: 1.008 (ref 1.005–1.030)
pH: 5 (ref 5.0–8.0)

## 2020-08-21 LAB — CBC
HCT: 27.6 % — ABNORMAL LOW (ref 36.0–46.0)
Hemoglobin: 9.3 g/dL — ABNORMAL LOW (ref 12.0–15.0)
MCH: 31.6 pg (ref 26.0–34.0)
MCHC: 33.7 g/dL (ref 30.0–36.0)
MCV: 93.9 fL (ref 80.0–100.0)
Platelets: 143 10*3/uL — ABNORMAL LOW (ref 150–400)
RBC: 2.94 MIL/uL — ABNORMAL LOW (ref 3.87–5.11)
RDW: 13.9 % (ref 11.5–15.5)
WBC: 5.8 10*3/uL (ref 4.0–10.5)
nRBC: 0 % (ref 0.0–0.2)

## 2020-08-21 LAB — SARS CORONAVIRUS 2 BY RT PCR (HOSPITAL ORDER, PERFORMED IN ~~LOC~~ HOSPITAL LAB): SARS Coronavirus 2: NEGATIVE

## 2020-08-21 MED ORDER — STERILE WATER FOR INJECTION IV SOLN
INTRAVENOUS | Status: DC
  Filled 2020-08-21 (×4): qty 850
  Filled 2020-08-21: qty 150
  Filled 2020-08-21 (×2): qty 850
  Filled 2020-08-21: qty 150

## 2020-08-21 MED ORDER — HYDROXYZINE HCL 25 MG PO TABS: 25.0000 mg | ORAL_TABLET | Freq: Three times a day (TID) | ORAL | Status: DC | PRN

## 2020-08-21 MED ORDER — PANTOPRAZOLE SODIUM 40 MG PO TBEC
40.0000 mg | DELAYED_RELEASE_TABLET | Freq: Every day | ORAL | Status: DC
  Administered 2020-08-21 – 2020-08-22 (×2): 40 mg via ORAL
  Filled 2020-08-21 (×2): qty 1

## 2020-08-21 MED ORDER — FERROUS SULFATE 325 (65 FE) MG PO TABS
325.0000 mg | ORAL_TABLET | Freq: Every day | ORAL | Status: DC
  Administered 2020-08-21 – 2020-08-22 (×2): 325 mg via ORAL
  Filled 2020-08-21 (×2): qty 1

## 2020-08-21 MED ORDER — ONDANSETRON HCL 4 MG/2ML IJ SOLN: 4.0000 mg | Freq: Four times a day (QID) | INTRAMUSCULAR | Status: DC | PRN

## 2020-08-21 MED ORDER — ACETAMINOPHEN 650 MG RE SUPP: 650.0000 mg | Freq: Four times a day (QID) | RECTAL | Status: DC | PRN

## 2020-08-21 MED ORDER — ADULT MULTIVITAMIN W/MINERALS CH
1.0000 | ORAL_TABLET | Freq: Every day | ORAL | Status: DC
  Administered 2020-08-21 – 2020-08-22 (×2): 1 via ORAL
  Filled 2020-08-21 (×2): qty 1

## 2020-08-21 MED ORDER — HYDRALAZINE HCL 50 MG PO TABS
100.0000 mg | ORAL_TABLET | Freq: Every day | ORAL | Status: DC
  Administered 2020-08-22: 100 mg via ORAL
  Filled 2020-08-21: qty 2

## 2020-08-21 MED ORDER — ACETAMINOPHEN 325 MG PO TABS: 650.0000 mg | ORAL_TABLET | Freq: Four times a day (QID) | ORAL | Status: DC | PRN

## 2020-08-21 MED ORDER — ASCORBIC ACID 500 MG PO TABS
250.0000 mg | ORAL_TABLET | Freq: Every day | ORAL | Status: DC
  Administered 2020-08-21 – 2020-08-22 (×2): 250 mg via ORAL
  Filled 2020-08-21 (×2): qty 1

## 2020-08-21 MED ORDER — APIXABAN 5 MG PO TABS
5.0000 mg | ORAL_TABLET | Freq: Two times a day (BID) | ORAL | Status: DC
  Administered 2020-08-21 – 2020-08-22 (×3): 5 mg via ORAL
  Filled 2020-08-21 (×4): qty 1

## 2020-08-21 MED ORDER — LEVOTHYROXINE SODIUM 50 MCG PO TABS
150.0000 ug | ORAL_TABLET | Freq: Every day | ORAL | Status: DC
  Administered 2020-08-21 – 2020-08-22 (×2): 150 ug via ORAL
  Filled 2020-08-21 (×2): qty 1

## 2020-08-21 MED ORDER — ZOLPIDEM TARTRATE 5 MG PO TABS: 5.0000 mg | ORAL_TABLET | Freq: Every evening | ORAL | Status: DC | PRN

## 2020-08-21 MED ORDER — MIRTAZAPINE 15 MG PO TABS
7.5000 mg | ORAL_TABLET | Freq: Every day | ORAL | Status: DC
  Administered 2020-08-21: 7.5 mg via ORAL
  Filled 2020-08-21: qty 1

## 2020-08-21 MED ORDER — SORBITOL 70 % SOLN
30.0000 mL | Status: DC | PRN
  Filled 2020-08-21: qty 30

## 2020-08-21 MED ORDER — ONDANSETRON 4 MG PO TBDP: 4.0000 mg | ORAL_TABLET | Freq: Three times a day (TID) | ORAL | Status: DC | PRN

## 2020-08-21 MED ORDER — HYDRALAZINE HCL 50 MG PO TABS
50.0000 mg | ORAL_TABLET | Freq: Two times a day (BID) | ORAL | Status: DC
  Administered 2020-08-21 – 2020-08-22 (×3): 50 mg via ORAL
  Filled 2020-08-21: qty 1
  Filled 2020-08-21: qty 2
  Filled 2020-08-21: qty 1

## 2020-08-21 MED ORDER — NEPRO/CARBSTEADY PO LIQD
237.0000 mL | Freq: Three times a day (TID) | ORAL | Status: DC | PRN
  Filled 2020-08-21: qty 237

## 2020-08-21 MED ORDER — HYDRALAZINE HCL 25 MG PO TABS: 50.0000 mg | ORAL_TABLET | ORAL | Status: DC

## 2020-08-21 MED ORDER — DOCUSATE SODIUM 283 MG RE ENEM
1.0000 | ENEMA | RECTAL | Status: DC | PRN
  Filled 2020-08-21: qty 1

## 2020-08-21 MED ORDER — CARVEDILOL PHOSPHATE ER 20 MG PO CP24
40.0000 mg | ORAL_CAPSULE | Freq: Every day | ORAL | Status: DC
  Administered 2020-08-21 – 2020-08-22 (×2): 40 mg via ORAL
  Filled 2020-08-21 (×2): qty 2

## 2020-08-21 MED ORDER — ONDANSETRON HCL 4 MG PO TABS: 4.0000 mg | ORAL_TABLET | Freq: Four times a day (QID) | ORAL | Status: DC | PRN

## 2020-08-21 MED ORDER — CLONIDINE HCL 0.1 MG PO TABS
0.1000 mg | ORAL_TABLET | Freq: Every day | ORAL | Status: DC
  Administered 2020-08-21: 0.1 mg via ORAL
  Filled 2020-08-21: qty 1

## 2020-08-21 MED ORDER — CAMPHOR-MENTHOL 0.5-0.5 % EX LOTN: 1.0000 "application " | TOPICAL_LOTION | Freq: Three times a day (TID) | CUTANEOUS | Status: DC | PRN

## 2020-08-21 MED ORDER — ROSUVASTATIN CALCIUM 20 MG PO TABS
40.0000 mg | ORAL_TABLET | Freq: Every day | ORAL | Status: DC
  Administered 2020-08-21 – 2020-08-22 (×2): 40 mg via ORAL
  Filled 2020-08-21 (×2): qty 2

## 2020-08-21 MED ORDER — CALCIUM CARBONATE ANTACID 1250 MG/5ML PO SUSP
500.0000 mg | Freq: Four times a day (QID) | ORAL | Status: DC | PRN
  Filled 2020-08-21: qty 5

## 2020-08-21 NOTE — Assessment & Plan Note (Signed)
-   Continue home regimen 

## 2020-08-21 NOTE — Assessment & Plan Note (Signed)
Continue Synthroid °

## 2020-08-21 NOTE — ED Notes (Signed)
ED TO INPATIENT HANDOFF REPORT  Name/Age/Gender Lauren Henry 79 y.o. female  Code Status    Code Status Orders  (From admission, onward)         Start     Ordered   08/21/20 0038  Full code  Continuous        08/21/20 0038        Code Status History    Date Active Date Inactive Code Status Order ID Comments User Context   10/30/2018 1734 11/03/2018 1511 Full Code [id number]  Barnetta Chapel, MD Inpatient   11/05/2015 1226 11/06/2015 1752 Full Code [id number]  Foye Clock Inpatient   11/01/2014 1721 11/02/2014 1740 Full Code [id number]  Marinus Maw, MD Inpatient   10/30/2014 0941 11/01/2014 1721 Full Code [id number]  Lennette Bihari, MD ED   Advance Care Planning Activity    Advance Directive Documentation     Most Recent Value  Type of Advance Directive Healthcare Power of Attorney, Living will  Pre-existing out of facility DNR order (yellow form or pink MOST form) --  "MOST" Form in Place? --      Home/SNF/Other Home  Chief Complaint ARF (acute renal failure) (HCC) [N17.9]  Level of Care/Admitting Diagnosis ED Disposition    ED Disposition Condition Comment   Admit  Hospital Area: [facility] [[id number]]  Level of Care: Telemetry [5]  Admit to tele based on following criteria: Complex arrhythmia (Bradycardia/Tachycardia)  Covid Evaluation: Asymptomatic Screening Protocol (No Symptoms)  Diagnosis: ARF (acute renal failure) (HCC) [338250]  Admitting Physician: Rometta Emery [[id number]]  Attending Physician: Rometta Emery [[id number]]       Medical History Past Medical History:  Diagnosis Date  . Arthritis   . Atrial fibrillation (HCC)   . Diarrhea    since gallbladder removed  . 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 History of blood transfusion as a child   no abnormal reaction noted  . History of colon polyps   . Hyperlipidemia    takes Pravastatin daily  . Hypertension     takes Azor and Metoprolol daily  . Hypothyroidism    takes Synthroid daily  . Joint pain   . Presence of permanent cardiac pacemaker   . Urinary frequency   . Urinary urgency     Allergies Allergies  Allergen Reactions  . Amlodipine Swelling and Rash    IV Location/Drains/Wounds Patient Lines/Drains/Airways Status    Active Line/Drains/Airways    Name Placement date Placement time Site Days   Peripheral IV 08/20/20 Anterior;Left;Proximal Forearm 08/20/20  2302  Forearm  1   Incision (Closed) 11/01/14 Chest Left;Upper 11/01/14  1723   2120   Incision (Closed) 11/05/15 Knee Left 11/05/15  0951   1751          Labs/Imaging Results for orders placed or performed during the hospital encounter of 08/20/20 (from the past 48 hour(s))  Lipase, blood     Status: None   Collection Time: 08/20/20  4:54 PM  Result Value Ref Range   Lipase 47 11 - 51 U/L    Comment: Performed at [facility], [address], [place], [place] [address]  Comprehensive metabolic panel     Status: Abnormal   Collection Time: 08/20/20  4:54 PM  Result Value Ref Range   Sodium 137 135 - 145 mmol/L   Potassium 4.6 3.5 - 5.1 mmol/L   Chloride 110 98 -  111 mmol/L   CO2 13 (L) 22 - 32 mmol/L   Glucose, Bld 111 (H) 70 - 99 mg/dL    Comment: Glucose reference range applies only to samples taken after fasting for at least 8 hours.   BUN 45 (H) 8 - 23 mg/dL   Creatinine, Ser 1.09 (H) 0.44 - 1.00 mg/dL   Calcium 9.6 8.9 - 32.3 mg/dL   Total Protein 7.3 6.5 - 8.1 g/dL   Albumin 4.0 3.5 - 5.0 g/dL   AST 17 15 - 41 U/L   ALT 18 0 - 44 U/L   Alkaline Phosphatase 58 38 - 126 U/L   Total Bilirubin 0.7 0.3 - 1.2 mg/dL   GFR calc non Af Amer 13 (L) >60 mL/min   GFR calc Af Amer 15 (L) >60 mL/min   Anion gap 14 5 - 15    Comment: Performed at [facility], [address], [place], [place] [address]  CBC     Status: Abnormal   Collection Time: 08/20/20  4:54 PM  Result  Value Ref Range   WBC 8.1 4.0 - 10.5 K/uL   RBC 3.57 (L) 3.87 - 5.11 MIL/uL   Hemoglobin 11.1 (L) 12.0 - 15.0 g/dL   HCT 20.2 (L) 36 - 46 %   MCV 95.0 80.0 - 100.0 fL   MCH 31.1 26.0 - 34.0 pg   MCHC 32.7 30.0 - 36.0 g/dL   RDW 54.2 70.6 - 23.7 %   Platelets 176 150 - 400 K/uL   nRBC 0.0 0.0 - 0.2 %    Comment: Performed at [facility], [address], [place], [place] [address]  SARS Coronavirus 2 by RT PCR (hospital order, performed in [facility] hospital lab) Nasopharyngeal Nasopharyngeal Swab     Status: None   Collection Time: 08/20/20 11:12 PM   Specimen: Nasopharyngeal Swab  Result Value Ref Range   SARS Coronavirus 2 NEGATIVE NEGATIVE    Comment: (NOTE) SARS-CoV-2 target nucleic acids are NOT DETECTED.  The SARS-CoV-2 RNA is generally detectable in upper and lower respiratory specimens during the acute phase of infection. The lowest concentration of SARS-CoV-2 viral copies this assay can detect is 250 copies / mL. A negative result does not preclude SARS-CoV-2 infection and should not be used as the sole basis for treatment or other patient management decisions.  A negative result may occur with improper specimen collection / handling, submission of specimen other than nasopharyngeal swab, presence of viral mutation(s) within the areas targeted by this assay, and inadequate number of viral copies (<250 copies / mL). A negative result must be combined with clinical observations, patient history, and epidemiological information.  Fact Sheet for Patients:   BoilerBrush.com.cy  Fact Sheet for Healthcare Providers: https://pope.com/  This test is not yet approved or  cleared by the [place] FDA and has been authorized for detection and/or diagnosis of SARS-CoV-2 by FDA under an Emergency Use Authorization (EUA).  This EUA will remain in effect (meaning this test can be used) for the duration of  the COVID-19 declaration under Section 564(b)(1) of the Act, 21 U.S.C. section 360bbb-3(b)(1), unless the authorization is terminated or revoked sooner.  Performed at [facility], [address], [place], [place] [address]   Urinalysis, Routine w reflex microscopic Urine, Clean Catch     Status: Abnormal   Collection Time: 08/20/20 11:30 PM  Result Value Ref Range   Color, Urine YELLOW YELLOW   APPearance CLEAR CLEAR  Specific Gravity, Urine 1.008 1.005 - 1.030   pH 5.0 5.0 - 8.0   Glucose, UA NEGATIVE NEGATIVE mg/dL   Hgb urine dipstick SMALL (A) NEGATIVE   Bilirubin Urine NEGATIVE NEGATIVE   Ketones, ur NEGATIVE NEGATIVE mg/dL   Protein, ur NEGATIVE NEGATIVE mg/dL   Nitrite NEGATIVE NEGATIVE   Leukocytes,Ua TRACE (A) NEGATIVE   RBC / HPF 0-5 0 - 5 RBC/hpf   WBC, UA 0-5 0 - 5 WBC/hpf   Bacteria, UA RARE (A) NONE SEEN   Squamous Epithelial / LPF 0-5 0 - 5   Mucus PRESENT     Comment: Performed at [facility], [address] [place], [place] [address]  Comprehensive metabolic panel     Status: Abnormal   Collection Time: 08/21/20  3:24 AM  Result Value Ref Range   Sodium 138 135 - 145 mmol/L   Potassium 5.0 3.5 - 5.1 mmol/L   Chloride 113 (H) 98 - 111 mmol/L   CO2 15 (L) 22 - 32 mmol/L   Glucose, Bld 83 70 - 99 mg/dL    Comment: Glucose reference range applies only to samples taken after fasting for at least 8 hours.   BUN 45 (H) 8 - 23 mg/dL   Creatinine, Ser 9.812.97 (H) 0.44 - 1.00 mg/dL   Calcium 8.9 8.9 - 19.110.3 mg/dL   Total Protein 6.0 (L) 6.5 - 8.1 g/dL   Albumin 3.3 (L) 3.5 - 5.0 g/dL   AST 18 15 - 41 U/L   ALT 17 0 - 44 U/L   Alkaline Phosphatase 47 38 - 126 U/L   Total Bilirubin 0.4 0.3 - 1.2 mg/dL   GFR calc non Af Amer 14 (L) >60 mL/min   GFR calc Af Amer 17 (L) >60 mL/min   Anion gap 10 5 - 15    Comment: Performed at [facility], [address] [place], [place] [address]  CBC     Status: Abnormal    Collection Time: 08/21/20  3:24 AM  Result Value Ref Range   WBC 5.8 4.0 - 10.5 K/uL   RBC 2.94 (L) 3.87 - 5.11 MIL/uL   Hemoglobin 9.3 (L) 12.0 - 15.0 g/dL   HCT 56.227.6 (L) 36 - 46 %   MCV 93.9 80.0 - 100.0 fL   MCH 31.6 26.0 - 34.0 pg   MCHC 33.7 30.0 - 36.0 g/dL   RDW 13.013.9 86.511.5 - 78.415.5 %   Platelets 143 (L) 150 - 400 K/uL   nRBC 0.0 0.0 - 0.2 %    Comment: Performed at [facility], [address] [place], [place] [address]   No results found.  Pending Labs Unresulted Labs (From admission, onward)          Start     Ordered   08/22/20 0500  Basic metabolic panel  Daily,   R      08/21/20 1845   08/22/20 0500  CBC with Differential/Platelet  Daily,   R      08/21/20 1845   08/22/20 0500  Magnesium  Daily,   R      08/21/20 1845          Vitals/Pain Today's Vitals   08/21/20 1500 08/21/20 1600 08/21/20 1700 08/21/20 1800  BP: (!) 107/53 103/64 (!) 109/55 (!) 105/55  Pulse: 65 64 62 (!) 58  Resp: 16 17 15 11   Temp:      TempSrc:      SpO2: 97% 96% 94%  95%  Weight:      Height:      PainSc:        Isolation Precautions No active isolations  Medications Medications  acetaminophen (TYLENOL) tablet 650 mg (has no administration in time range)    Or  acetaminophen (TYLENOL) suppository 650 mg (has no administration in time range)  zolpidem (AMBIEN) tablet 5 mg (has no administration in time range)  camphor-menthol (SARNA) lotion 1 application (has no administration in time range)    And  hydrOXYzine (ATARAX/VISTARIL) tablet 25 mg (has no administration in time range)  sorbitol 70 % solution 30 mL (has no administration in time range)  docusate sodium (ENEMEEZ) enema 283 mg (has no administration in time range)  ondansetron (ZOFRAN) tablet 4 mg (has no administration in time range)    Or  ondansetron (ZOFRAN) injection 4 mg (has no administration in time range)  calcium carbonate (dosed in mg elemental calcium) suspension 500 mg of  elemental calcium (has no administration in time range)  feeding supplement (NEPRO CARB STEADY) liquid 237 mL (has no administration in time range)  sodium bicarbonate 150 mEq in sterile water 1,000 mL infusion ( Intravenous New Bag/Given (Non-Interop) 08/21/20 1114)  apixaban (ELIQUIS) tablet 5 mg (5 mg Oral Given 08/21/20 1033)  multivitamin with minerals tablet 1 tablet (1 tablet Oral Given 08/21/20 1029)  levothyroxine (SYNTHROID) tablet 150 mcg (150 mcg Oral Given 08/21/20 1038)  carvedilol (COREG CR) 24 hr capsule 40 mg (40 mg Oral Given 08/21/20 1028)  pantoprazole (PROTONIX) EC tablet 40 mg (40 mg Oral Given 08/21/20 1027)  mirtazapine (REMERON) tablet 7.5 mg (has no administration in time range)  rosuvastatin (CRESTOR) tablet 40 mg (40 mg Oral Given 08/21/20 1028)  ferrous sulfate tablet 325 mg (325 mg Oral Given 08/21/20 1038)  ascorbic acid (VITAMIN C) tablet 250 mg (250 mg Oral Given 08/21/20 1027)  cloNIDine (CATAPRES) tablet 0.1 mg (has no administration in time range)  ondansetron (ZOFRAN-ODT) disintegrating tablet 4 mg (has no administration in time range)  hydrALAZINE (APRESOLINE) tablet 50 mg (50 mg Oral Given 08/21/20 1039)    And  hydrALAZINE (APRESOLINE) tablet 100 mg (0 mg Oral Hold 08/21/20 1118)  lactated ringers bolus 1,000 mL (0 mLs Intravenous Stopped 08/21/20 0037)    Mobility walks

## 2020-08-21 NOTE — ED Notes (Signed)
Pt ambulated to BR without assistance

## 2020-08-21 NOTE — Assessment & Plan Note (Signed)
Continue Eliquis and Coreg 

## 2020-08-21 NOTE — Assessment & Plan Note (Signed)
continue statin

## 2020-08-21 NOTE — Hospital Course (Addendum)
Lauren Henry is a 79 yo CF with PMH afib, HLD, hypothyroidism, HTN, hx pacemaker placement who presented to the ER with N/V/D.  She states her symptoms have been ongoing for almost 1 to 2 weeks.  She denied any fevers or abdominal pain.  Also denied any other contacts with similar symptoms.  She has also been tested for COVID-19 and is negative as well as is vaccinated.  She was found to have an AKI, was started on IV fluids, and admitted for further work-up and treatment.  She does state that she sent off stool samples outpatient prior to coming to the hospital for testing. Her renal function improved with IVF after admission. She was recommended to have a repeat BMP at discharge thru her PCP office. She voiced understanding to this. Her N/V/D had also dramatically improved as she had no further diarrhea and was tolerating a diet prior to discharge. She was instructed to advance her diet slowly and if she started to have vomiting or diarrhea again then she may require repeat IVF once again. She was discharged in stable condition.

## 2020-08-21 NOTE — Assessment & Plan Note (Addendum)
-   baseline creat ~1.5 - 1.7, admitted with creat 3.22 - improved to 2.24 at discharge after IVF - no further vomiting nor diarrhea and is tolerating diet; okay for d/c home

## 2020-08-21 NOTE — ED Notes (Signed)
Pt placed on purewick on 50mmHg. °

## 2020-08-21 NOTE — Assessment & Plan Note (Addendum)
-   resume home meds at d/c

## 2020-08-21 NOTE — Progress Notes (Signed)
PROGRESS NOTE    Lauren Henry   HEN:[id number]  DOB: 1941-03-09  DOA: 08/20/2020     0  PCP: Gaspar Garbe, MD  CC: N/V/D  Hospital Course: Lauren Henry is a 79 yo CF with PMH afib, HLD, hypothyroidism, HTN, hx pacemaker placement who presented to the ER with N/V/D.  She states her symptoms have been ongoing for almost 1 to 2 weeks.  She denied any fevers or abdominal pain.  Also denied any other contacts with similar symptoms.  She has also been tested for COVID-19 and is negative as well as is vaccinated.  She was found to have an AKI, was started on IV fluids, and admitted for further work-up and treatment.  She does state that she sent off stool samples outpatient prior to coming to the hospital for testing.   Interval History:  Seen in the ER this morning still awaiting a bed.  Starting to feel slightly better but appetite remains still poor.  Nausea and vomiting seem to be under better control however.  Stools are also starting to form she says.  Old records reviewed in assessment of this patient  ROS: Constitutional: negative for chills and fevers, Respiratory: negative for cough, Cardiovascular: negative for chest pain and Gastrointestinal: negative for abdominal pain  Assessment & Plan: Acute renal failure superimposed on stage 3b chronic kidney disease (HCC) - baseline creat ~1.5 - 1.7, admitted with creat 3.22 - etiology considered pre-renal from severe N/V/D. Given length of pre-renal may also be at risk for ATN - continue IVF and monitor renal response; currently on bicarb drib which is appropriate given severe NAGMA  Essential hypertension -Continue clonidine, will need to hold if becomes hypotensive  Hyperlipidemia - continue statin  Hypothyroidism -Continue Synthroid  PAF (paroxysmal atrial fibrillation) (HCC) -Continue Eliquis and Coreg  CAD in native artery -Continue home regimen   Antimicrobials: None  DVT prophylaxis: Eliquis Code Status:  Full Family Communication: None present Disposition Plan: Status is: Observation  The patient remains OBS appropriate and will d/c before 2 midnights.  Dispo: The patient is from: Home              Anticipated d/c is to: Home              Anticipated d/c date is: 1 day              Patient currently is not medically stable to d/c.       Objective: Blood pressure (!) 105/55, pulse (!) 58, temperature 98.3 F (36.8 C), resp. rate 11, height 5\' 5"  (1.651 m), weight 83 kg, SpO2 95 %.  Examination: General appearance: alert, cooperative and no distress Head: Normocephalic, without obvious abnormality, atraumatic Eyes: EOMI Lungs: clear to auscultation bilaterally Heart: regular rate and rhythm and S1, S2 normal Abdomen: normal findings: bowel sounds normal and soft, non-tender Extremities: No edema Skin: mobility and turgor normal Neurologic: Grossly normal  Consultants:   None  Procedures:   None  Data Reviewed: I have personally reviewed following labs and imaging studies Results for orders placed or performed during the hospital encounter of 08/20/20 (from the past 24 hour(s))  SARS Coronavirus 2 by RT PCR (hospital order, performed in [facility] hospital lab) Nasopharyngeal Nasopharyngeal Swab     Status: None   Collection Time: 08/20/20 11:12 PM   Specimen: Nasopharyngeal Swab  Result Value Ref Range   SARS Coronavirus 2 NEGATIVE NEGATIVE  Urinalysis, Routine w reflex microscopic Urine, Clean Catch  Status: Abnormal   Collection Time: 08/20/20 11:30 PM  Result Value Ref Range   Color, Urine YELLOW YELLOW   APPearance CLEAR CLEAR   Specific Gravity, Urine 1.008 1.005 - 1.030   pH 5.0 5.0 - 8.0   Glucose, UA NEGATIVE NEGATIVE mg/dL   Hgb urine dipstick SMALL (A) NEGATIVE   Bilirubin Urine NEGATIVE NEGATIVE   Ketones, ur NEGATIVE NEGATIVE mg/dL   Protein, ur NEGATIVE NEGATIVE mg/dL   Nitrite NEGATIVE NEGATIVE   Leukocytes,Ua TRACE (A) NEGATIVE   RBC / HPF  0-5 0 - 5 RBC/hpf   WBC, UA 0-5 0 - 5 WBC/hpf   Bacteria, UA RARE (A) NONE SEEN   Squamous Epithelial / LPF 0-5 0 - 5   Mucus PRESENT   Comprehensive metabolic panel     Status: Abnormal   Collection Time: 08/21/20  3:24 AM  Result Value Ref Range   Sodium 138 135 - 145 mmol/L   Potassium 5.0 3.5 - 5.1 mmol/L   Chloride 113 (H) 98 - 111 mmol/L   CO2 15 (L) 22 - 32 mmol/L   Glucose, Bld 83 70 - 99 mg/dL   BUN 45 (H) 8 - 23 mg/dL   Creatinine, Ser 6.01 (H) 0.44 - 1.00 mg/dL   Calcium 8.9 8.9 - 09.3 mg/dL   Total Protein 6.0 (L) 6.5 - 8.1 g/dL   Albumin 3.3 (L) 3.5 - 5.0 g/dL   AST 18 15 - 41 U/L   ALT 17 0 - 44 U/L   Alkaline Phosphatase 47 38 - 126 U/L   Total Bilirubin 0.4 0.3 - 1.2 mg/dL   GFR calc non Af Amer 14 (L) >60 mL/min   GFR calc Af Amer 17 (L) >60 mL/min   Anion gap 10 5 - 15  CBC     Status: Abnormal   Collection Time: 08/21/20  3:24 AM  Result Value Ref Range   WBC 5.8 4.0 - 10.5 K/uL   RBC 2.94 (L) 3.87 - 5.11 MIL/uL   Hemoglobin 9.3 (L) 12.0 - 15.0 g/dL   HCT 23.5 (L) 36 - 46 %   MCV 93.9 80.0 - 100.0 fL   MCH 31.6 26.0 - 34.0 pg   MCHC 33.7 30.0 - 36.0 g/dL   RDW 57.3 22.0 - 25.4 %   Platelets 143 (L) 150 - 400 K/uL   nRBC 0.0 0.0 - 0.2 %    Recent Results (from the past 240 hour(s))  SARS Coronavirus 2 by RT PCR (hospital order, performed in [facility] hospital lab) Nasopharyngeal Nasopharyngeal Swab     Status: None   Collection Time: 08/20/20 11:12 PM   Specimen: Nasopharyngeal Swab  Result Value Ref Range Status   SARS Coronavirus 2 NEGATIVE NEGATIVE Final    Comment: (NOTE) SARS-CoV-2 target nucleic acids are NOT DETECTED.  The SARS-CoV-2 RNA is generally detectable in upper and lower respiratory specimens during the acute phase of infection. The lowest concentration of SARS-CoV-2 viral copies this assay can detect is 250 copies / mL. A negative result does not preclude SARS-CoV-2 infection and should not be used as the sole basis for  treatment or other patient management decisions.  A negative result may occur with improper specimen collection / handling, submission of specimen other than nasopharyngeal swab, presence of viral mutation(s) within the areas targeted by this assay, and inadequate number of viral copies (<250 copies / mL). A negative result must be combined with clinical observations, patient history, and epidemiological information.  Fact Sheet for  Patients:   BoilerBrush.com.cy  Fact Sheet for Healthcare Providers: https://pope.com/  This test is not yet approved or  cleared by the [place] FDA and has been authorized for detection and/or diagnosis of SARS-CoV-2 by FDA under an Emergency Use Authorization (EUA).  This EUA will remain in effect (meaning this test can be used) for the duration of the COVID-19 declaration under Section 564(b)(1) of the Act, 21 U.S.C. section 360bbb-3(b)(1), unless the authorization is terminated or revoked sooner.  Performed at [facility], [address], [place], [place] [address]      Radiology Studies: No results found. No orders to display    Scheduled Meds: . apixaban  5 mg Oral BID  . vitamin C  250 mg Oral Daily  . carvedilol  40 mg Oral Daily  . cloNIDine  0.1 mg Oral QHS  . ferrous sulfate  325 mg Oral Q breakfast  . hydrALAZINE  50 mg Oral BID   And  . hydrALAZINE  100 mg Oral Q lunch  . levothyroxine  150 mcg Oral QAC breakfast  . mirtazapine  7.5 mg Oral QHS  . multivitamin with minerals  1 tablet Oral Daily  . pantoprazole  40 mg Oral Daily  . rosuvastatin  40 mg Oral Daily   PRN Meds: acetaminophen **OR** acetaminophen, calcium carbonate (dosed in mg elemental calcium), camphor-menthol **AND** hydrOXYzine, docusate sodium, feeding supplement (NEPRO CARB STEADY), ondansetron **OR** ondansetron (ZOFRAN) IV, ondansetron, sorbitol, zolpidem Continuous Infusions: .   sodium bicarbonate (isotonic) infusion in sterile water 125 mL/hr at 08/21/20 1114      LOS: 0 days  Time spent: Greater than 50% of the 35 minute visit was spent in counseling/coordination of care for the patient as laid out in the A&P.   Lewie Chamber, MD [facility] 08/21/2020, 6:43 PM  Contact via secure chat.  To contact the attending provider between 7A-7P or the covering provider during after hours 7P-7A, please log into the web site [email or web address] and access using universal Maili password for that web site. If you do not have the password, please call the hospital operator.

## 2020-08-22 DIAGNOSIS — N1832 Chronic kidney disease, stage 3b: Secondary | ICD-10-CM | POA: Diagnosis not present

## 2020-08-22 DIAGNOSIS — N179 Acute kidney failure, unspecified: Secondary | ICD-10-CM | POA: Diagnosis not present

## 2020-08-22 LAB — CBC WITH DIFFERENTIAL/PLATELET
Abs Immature Granulocytes: 0.01 10*3/uL (ref 0.00–0.07)
Basophils Absolute: 0 10*3/uL (ref 0.0–0.1)
Basophils Relative: 1 %
Eosinophils Absolute: 0.2 10*3/uL (ref 0.0–0.5)
Eosinophils Relative: 3 %
HCT: 27.2 % — ABNORMAL LOW (ref 36.0–46.0)
Hemoglobin: 9.3 g/dL — ABNORMAL LOW (ref 12.0–15.0)
Immature Granulocytes: 0 %
Lymphocytes Relative: 25 %
Lymphs Abs: 1.4 10*3/uL (ref 0.7–4.0)
MCH: 31.4 pg (ref 26.0–34.0)
MCHC: 34.2 g/dL (ref 30.0–36.0)
MCV: 91.9 fL (ref 80.0–100.0)
Monocytes Absolute: 0.6 10*3/uL (ref 0.1–1.0)
Monocytes Relative: 11 %
Neutro Abs: 3.4 10*3/uL (ref 1.7–7.7)
Neutrophils Relative %: 60 %
Platelets: 144 10*3/uL — ABNORMAL LOW (ref 150–400)
RBC: 2.96 MIL/uL — ABNORMAL LOW (ref 3.87–5.11)
RDW: 13.5 % (ref 11.5–15.5)
WBC: 5.6 10*3/uL (ref 4.0–10.5)
nRBC: 0 % (ref 0.0–0.2)

## 2020-08-22 LAB — BASIC METABOLIC PANEL
Anion gap: 11 (ref 5–15)
BUN: 33 mg/dL — ABNORMAL HIGH (ref 8–23)
CO2: 27 mmol/L (ref 22–32)
Calcium: 8.6 mg/dL — ABNORMAL LOW (ref 8.9–10.3)
Chloride: 104 mmol/L (ref 98–111)
Creatinine, Ser: 2.24 mg/dL — ABNORMAL HIGH (ref 0.44–1.00)
GFR calc Af Amer: 24 mL/min — ABNORMAL LOW (ref >60)
GFR calc non Af Amer: 20 mL/min — ABNORMAL LOW (ref >60)
Glucose, Bld: 82 mg/dL (ref 70–99)
Potassium: 3.8 mmol/L (ref 3.5–5.1)
Sodium: 142 mmol/L (ref 135–145)

## 2020-08-22 LAB — MAGNESIUM: Magnesium: 1.4 mg/dL — ABNORMAL LOW (ref 1.7–2.4)

## 2020-08-22 MED ORDER — MAGNESIUM OXIDE 400 (241.3 MG) MG PO TABS
800.0000 mg | ORAL_TABLET | Freq: Once | ORAL | Status: AC
  Administered 2020-08-22: 800 mg via ORAL
  Filled 2020-08-22: qty 2

## 2020-08-22 NOTE — Discharge Summary (Signed)
Physician Discharge Summary  Lauren Henry EUM:[id number] DOB: 10-25-1941 DOA: 08/20/2020  PCP: Gaspar Garbe, MD  Admit date: 08/20/2020 Discharge date: 08/22/2020  Admitted From: home Disposition:  home Discharging physician: Lewie Chamber, MD  Recommendations for Outpatient Follow-up:  1. Repeat BMP at followup  Patient discharged to home in Discharge Condition: stable CODE STATUS: Full Diet recommendation:  Diet Orders (From admission, onward)    Start     Ordered   08/21/20 0038  Diet renal/carb modified with fluid restriction Diet-HS Snack? Nothing; Fluid restriction: 1200 mL Fluid; Room service appropriate? Yes; Fluid consistency: Thin  Diet effective now       Question Answer Comment  Diet-HS Snack? Nothing   Fluid restriction: 1200 mL Fluid   Room service appropriate? Yes   Fluid consistency: Thin      08/21/20 0038          Hospital Course: Lauren Henry is a 79 yo CF with PMH afib, HLD, hypothyroidism, HTN, hx pacemaker placement who presented to the ER with N/V/D.  She states her symptoms have been ongoing for almost 1 to 2 weeks.  She denied any fevers or abdominal pain.  Also denied any other contacts with similar symptoms.  She has also been tested for COVID-19 and is negative as well as is vaccinated.  She was found to have an AKI, was started on IV fluids, and admitted for further work-up and treatment.  She does state that she sent off stool samples outpatient prior to coming to the hospital for testing. Her renal function improved with IVF after admission. She was recommended to have a repeat BMP at discharge thru her PCP office. She voiced understanding to this. Her N/V/D had also dramatically improved as she had no further diarrhea and was tolerating a diet prior to discharge. She was instructed to advance her diet slowly and if she started to have vomiting or diarrhea again then she may require repeat IVF once again. She was discharged in stable condition.     Acute renal failure superimposed on stage 3b chronic kidney disease (HCC) - baseline creat ~1.5 - 1.7, admitted with creat 3.22 - improved to 2.24 at discharge after IVF - no further vomiting nor diarrhea and is tolerating diet; okay for d/c home   Essential hypertension - resume home meds at d/c   Hyperlipidemia - continue statin  Hypothyroidism -Continue Synthroid  PAF (paroxysmal atrial fibrillation) (HCC) -Continue Eliquis and Coreg  CAD in native artery -Continue home regimen    The patient's chronic medical conditions were treated accordingly per the patient's home medication regimen except as noted.  On day of discharge, patient was felt deemed stable for discharge. Patient/family member advised to call PCP or come back to ER if needed.   Discharge Diagnoses:   Principal Diagnosis: Acute renal failure superimposed on stage 3b chronic kidney disease [facility])  Active Hospital Problems   Diagnosis Date Noted  . Acute renal failure superimposed on stage 3b chronic kidney disease (HCC) 08/20/2020    Priority: High  . CAD in native artery 11/24/2018  . PAF (paroxysmal atrial fibrillation) (HCC) 11/09/2014  . Essential hypertension 10/30/2014  . Hyperlipidemia 10/30/2014  . Hypothyroidism 10/30/2014    Resolved Hospital Problems  No resolved problems to display.    Discharge Instructions    Increase activity slowly   Complete by: As directed      Allergies as of 08/22/2020      Reactions   Amlodipine Swelling, Rash  Medication List    TAKE these medications   apixaban 5 MG Tabs tablet Commonly known as: Eliquis Take 1 tablet (5 mg total) by mouth 2 (two) times daily.   carvedilol 40 MG 24 hr capsule Commonly known as: COREG CR Take 40 mg by mouth daily.   cloNIDine 0.2 MG tablet Commonly known as: CATAPRES TAKE 1 TABLET(0.2MG ) BY MOUTH AT BEDTIME. HOLD IF SYSTOLIC BLOOD PRESSURE LESS THAN 200 What changed: See the new instructions.   ferrous  sulfate 325 (65 FE) MG tablet Take 325 mg by mouth daily with breakfast.   hydrALAZINE 50 MG tablet Commonly known as: APRESOLINE Take 50 mg by mouth See admin instructions. 50 Mg in the Am, 100 MG at lunch and 50 MG at night   levothyroxine 150 MCG tablet Commonly known as: SYNTHROID Take 150 mcg by mouth daily before breakfast.   mirtazapine 7.5 MG tablet Commonly known as: REMERON Take 7.5 mg by mouth at bedtime.   multivitamin with minerals Tabs tablet Take 1 tablet by mouth daily.   olmesartan 40 MG tablet Commonly known as: BENICAR Take 1 tablet (40 mg total) by mouth daily.   ondansetron 4 MG disintegrating tablet Commonly known as: ZOFRAN-ODT Take 4 mg by mouth every 8 (eight) hours as needed for nausea or vomiting.   pantoprazole 40 MG tablet Commonly known as: PROTONIX Take 40 mg by mouth daily.   rosuvastatin 40 MG tablet Commonly known as: CRESTOR TAKE 1 TABLET BY MOUTH DAILY   spironolactone 25 MG tablet Commonly known as: ALDACTONE Take 1 tablet (25 mg total) by mouth daily.   vitamin C 250 MG tablet Commonly known as: ASCORBIC ACID Take 250 mg by mouth daily.   VITAMIN D (CHOLECALCIFEROL) PO Take 1 tablet by mouth daily.       Allergies  Allergen Reactions  . Amlodipine Swelling and Rash    Consultations: none  Discharge Exam: BP (!) 129/56   Pulse 67   Temp 98.1 F (36.7 C) (Oral)   Resp 16   Ht 5\' 5"  (1.651 m)   Wt 84.4 kg   SpO2 95%   BMI 30.97 kg/m  General appearance: alert, cooperative and no distress Head: Normocephalic, without obvious abnormality, atraumatic Eyes: EOMI Lungs: clear to auscultation bilaterally Heart: regular rate and rhythm and S1, S2 normal Abdomen: normal findings: bowel sounds normal and soft, non-tender Extremities: No edema Skin: mobility and turgor normal Neurologic: Grossly normal  The results of significant diagnostics from this hospitalization (including imaging, microbiology, ancillary and  laboratory) are listed below for reference.   Microbiology: Recent Results (from the past 240 hour(s))  SARS Coronavirus 2 by RT PCR (hospital order, performed in [facility] hospital lab) Nasopharyngeal Nasopharyngeal Swab     Status: None   Collection Time: 08/20/20 11:12 PM   Specimen: Nasopharyngeal Swab  Result Value Ref Range Status   SARS Coronavirus 2 NEGATIVE NEGATIVE Final    Comment: (NOTE) SARS-CoV-2 target nucleic acids are NOT DETECTED.  The SARS-CoV-2 RNA is generally detectable in upper and lower respiratory specimens during the acute phase of infection. The lowest concentration of SARS-CoV-2 viral copies this assay can detect is 250 copies / mL. A negative result does not preclude SARS-CoV-2 infection and should not be used as the sole basis for treatment or other patient management decisions.  A negative result may occur with improper specimen collection / handling, submission of specimen other than nasopharyngeal swab, presence of viral mutation(s) within the areas targeted by this  assay, and inadequate number of viral copies (<250 copies / mL). A negative result must be combined with clinical observations, patient history, and epidemiological information.  Fact Sheet for Patients:   BoilerBrush.com.cy  Fact Sheet for Healthcare Providers: https://pope.com/  This test is not yet approved or  cleared by the [place] FDA and has been authorized for detection and/or diagnosis of SARS-CoV-2 by FDA under an Emergency Use Authorization (EUA).  This EUA will remain in effect (meaning this test can be used) for the duration of the COVID-19 declaration under Section 564(b)(1) of the Act, 21 U.S.C. section 360bbb-3(b)(1), unless the authorization is terminated or revoked sooner.  Performed at [facility], [address], [place], [place] [address]      Labs: BNP (last 3 results) No results for  input(s): BNP in the last 8760 hours. Basic Metabolic Panel: Recent Labs  Lab 08/20/20 1654 08/21/20 0324 08/22/20 0452  NA 137 138 142  K 4.6 5.0 3.8  CL 110 113* 104  CO2 13* 15* 27  GLUCOSE 111* 83 82  BUN 45* 45* 33*  CREATININE 3.22* 2.97* 2.24*  CALCIUM 9.6 8.9 8.6*  MG  --   --  1.4*   Liver Function Tests: Recent Labs  Lab 08/20/20 1654 08/21/20 0324  AST 17 18  ALT 18 17  ALKPHOS 58 47  BILITOT 0.7 0.4  PROT 7.3 6.0*  ALBUMIN 4.0 3.3*   Recent Labs  Lab 08/20/20 1654  LIPASE 47   No results for input(s): AMMONIA in the last 168 hours. CBC: Recent Labs  Lab 08/20/20 1654 08/21/20 0324 08/22/20 0452  WBC 8.1 5.8 5.6  NEUTROABS  --   --  3.4  HGB 11.1* 9.3* 9.3*  HCT 33.9* 27.6* 27.2*  MCV 95.0 93.9 91.9  PLT 176 143* 144*   Cardiac Enzymes: No results for input(s): CKTOTAL, CKMB, CKMBINDEX, TROPONINI in the last 168 hours. BNP: Invalid input(s): POCBNP CBG: No results for input(s): GLUCAP in the last 168 hours. D-Dimer No results for input(s): DDIMER in the last 72 hours. Hgb A1c No results for input(s): HGBA1C in the last 72 hours. Lipid Profile No results for input(s): CHOL, HDL, LDLCALC, TRIG, CHOLHDL, LDLDIRECT in the last 72 hours. Thyroid function studies No results for input(s): TSH, T4TOTAL, T3FREE, THYROIDAB in the last 72 hours.  Invalid input(s): FREET3 Anemia work up No results for input(s): VITAMINB12, FOLATE, FERRITIN, TIBC, IRON, RETICCTPCT in the last 72 hours. Urinalysis    Component Value Date/Time   COLORURINE YELLOW 08/20/2020 2330   APPEARANCEUR CLEAR 08/20/2020 2330   LABSPEC 1.008 08/20/2020 2330   PHURINE 5.0 08/20/2020 2330   GLUCOSEU NEGATIVE 08/20/2020 2330   HGBUR SMALL (A) 08/20/2020 2330   BILIRUBINUR NEGATIVE 08/20/2020 2330   KETONESUR NEGATIVE 08/20/2020 2330   PROTEINUR NEGATIVE 08/20/2020 2330   UROBILINOGEN 0.2 10/26/2015 0957   NITRITE NEGATIVE 08/20/2020 2330   LEUKOCYTESUR TRACE (A)  08/20/2020 2330   Sepsis Labs Invalid input(s): PROCALCITONIN,  WBC,  LACTICIDVEN Microbiology Recent Results (from the past 240 hour(s))  SARS Coronavirus 2 by RT PCR (hospital order, performed in [facility] hospital lab) Nasopharyngeal Nasopharyngeal Swab     Status: None   Collection Time: 08/20/20 11:12 PM   Specimen: Nasopharyngeal Swab  Result Value Ref Range Status   SARS Coronavirus 2 NEGATIVE NEGATIVE Final    Comment: (NOTE) SARS-CoV-2 target nucleic acids are NOT DETECTED.  The SARS-CoV-2 RNA is generally detectable in upper and lower respiratory specimens during  the acute phase of infection. The lowest concentration of SARS-CoV-2 viral copies this assay can detect is 250 copies / mL. A negative result does not preclude SARS-CoV-2 infection and should not be used as the sole basis for treatment or other patient management decisions.  A negative result may occur with improper specimen collection / handling, submission of specimen other than nasopharyngeal swab, presence of viral mutation(s) within the areas targeted by this assay, and inadequate number of viral copies (<250 copies / mL). A negative result must be combined with clinical observations, patient history, and epidemiological information.  Fact Sheet for Patients:   BoilerBrush.com.cy  Fact Sheet for Healthcare Providers: https://pope.com/  This test is not yet approved or  cleared by the [place] FDA and has been authorized for detection and/or diagnosis of SARS-CoV-2 by FDA under an Emergency Use Authorization (EUA).  This EUA will remain in effect (meaning this test can be used) for the duration of the COVID-19 declaration under Section 564(b)(1) of the Act, 21 U.S.C. section 360bbb-3(b)(1), unless the authorization is terminated or revoked sooner.  Performed at [facility], [address], [place], [place] [address]      Procedures/Studies: CUP PACEART REMOTE DEVICE CHECK  Result Date: 08/07/2020 Scheduled remote reviewed. Normal device function.  9 AMS, all < 1 min. OAC- Eliquis Next remote 91 days- JBox, RN/CVRS    Time coordinating discharge: Over 30 minutes    Lewie Chamber, MD  [facility] 08/22/2020, 12:09 PM Pager: Secure chat  If 7PM-7AM, please contact night-coverage [email or web address] Password [email or web address]

## 2020-08-23 ENCOUNTER — Observation Stay (HOSPITAL_COMMUNITY): Payer: Medicare PPO

## 2020-08-23 ENCOUNTER — Encounter (HOSPITAL_COMMUNITY): Payer: Self-pay

## 2020-08-23 ENCOUNTER — Other Ambulatory Visit: Payer: Self-pay

## 2020-08-23 ENCOUNTER — Inpatient Hospital Stay (HOSPITAL_COMMUNITY)
Admission: EM | Admit: 2020-08-23 | Discharge: 2020-08-28 | DRG: 683 | Disposition: A | Discharge status: Home Health | Payer: Medicare PPO | Attending: Internal Medicine | Admitting: Internal Medicine

## 2020-08-23 DIAGNOSIS — K529 Noninfective gastroenteritis and colitis, unspecified: Secondary | ICD-10-CM | POA: Diagnosis present

## 2020-08-23 DIAGNOSIS — Z20822 Contact with and (suspected) exposure to covid-19: Secondary | ICD-10-CM | POA: Diagnosis present

## 2020-08-23 DIAGNOSIS — R112 Nausea with vomiting, unspecified: Secondary | ICD-10-CM

## 2020-08-23 DIAGNOSIS — I10 Essential (primary) hypertension: Secondary | ICD-10-CM | POA: Diagnosis present

## 2020-08-23 DIAGNOSIS — Z950 Presence of cardiac pacemaker: Secondary | ICD-10-CM

## 2020-08-23 DIAGNOSIS — D631 Anemia in chronic kidney disease: Secondary | ICD-10-CM | POA: Diagnosis present

## 2020-08-23 DIAGNOSIS — I480 Paroxysmal atrial fibrillation: Secondary | ICD-10-CM | POA: Diagnosis present

## 2020-08-23 DIAGNOSIS — D696 Thrombocytopenia, unspecified: Secondary | ICD-10-CM | POA: Diagnosis not present

## 2020-08-23 DIAGNOSIS — I442 Atrioventricular block, complete: Secondary | ICD-10-CM | POA: Diagnosis present

## 2020-08-23 DIAGNOSIS — Z79899 Other long term (current) drug therapy: Secondary | ICD-10-CM | POA: Diagnosis not present

## 2020-08-23 DIAGNOSIS — I129 Hypertensive chronic kidney disease with stage 1 through stage 4 chronic kidney disease, or unspecified chronic kidney disease: Secondary | ICD-10-CM | POA: Diagnosis present

## 2020-08-23 DIAGNOSIS — I2510 Atherosclerotic heart disease of native coronary artery without angina pectoris: Secondary | ICD-10-CM | POA: Diagnosis present

## 2020-08-23 DIAGNOSIS — E162 Hypoglycemia, unspecified: Secondary | ICD-10-CM | POA: Diagnosis present

## 2020-08-23 DIAGNOSIS — E039 Hypothyroidism, unspecified: Secondary | ICD-10-CM | POA: Diagnosis present

## 2020-08-23 DIAGNOSIS — Z7901 Long term (current) use of anticoagulants: Secondary | ICD-10-CM

## 2020-08-23 DIAGNOSIS — M1990 Unspecified osteoarthritis, unspecified site: Secondary | ICD-10-CM | POA: Diagnosis present

## 2020-08-23 DIAGNOSIS — E7800 Pure hypercholesterolemia, unspecified: Secondary | ICD-10-CM | POA: Diagnosis not present

## 2020-08-23 DIAGNOSIS — Z888 Allergy status to other drugs, medicaments and biological substances status: Secondary | ICD-10-CM | POA: Diagnosis not present

## 2020-08-23 DIAGNOSIS — K429 Umbilical hernia without obstruction or gangrene: Secondary | ICD-10-CM | POA: Diagnosis not present

## 2020-08-23 DIAGNOSIS — N1832 Chronic kidney disease, stage 3b: Secondary | ICD-10-CM | POA: Diagnosis present

## 2020-08-23 DIAGNOSIS — I959 Hypotension, unspecified: Secondary | ICD-10-CM | POA: Diagnosis present

## 2020-08-23 DIAGNOSIS — Z8249 Family history of ischemic heart disease and other diseases of the circulatory system: Secondary | ICD-10-CM

## 2020-08-23 DIAGNOSIS — I700 Atherosclerosis of aorta: Secondary | ICD-10-CM | POA: Diagnosis not present

## 2020-08-23 DIAGNOSIS — E785 Hyperlipidemia, unspecified: Secondary | ICD-10-CM | POA: Diagnosis present

## 2020-08-23 DIAGNOSIS — E860 Dehydration: Secondary | ICD-10-CM | POA: Diagnosis present

## 2020-08-23 DIAGNOSIS — N189 Chronic kidney disease, unspecified: Secondary | ICD-10-CM

## 2020-08-23 DIAGNOSIS — K5730 Diverticulosis of large intestine without perforation or abscess without bleeding: Secondary | ICD-10-CM | POA: Diagnosis not present

## 2020-08-23 DIAGNOSIS — K449 Diaphragmatic hernia without obstruction or gangrene: Secondary | ICD-10-CM | POA: Diagnosis not present

## 2020-08-23 DIAGNOSIS — N179 Acute kidney failure, unspecified: Principal | ICD-10-CM | POA: Diagnosis present

## 2020-08-23 DIAGNOSIS — R197 Diarrhea, unspecified: Secondary | ICD-10-CM | POA: Diagnosis not present

## 2020-08-23 DIAGNOSIS — Z79890 Hormone replacement therapy (postmenopausal): Secondary | ICD-10-CM | POA: Diagnosis not present

## 2020-08-23 HISTORY — DX: Nausea with vomiting, unspecified: R11.2

## 2020-08-23 LAB — CBC
HCT: 34 % — ABNORMAL LOW (ref 36.0–46.0)
Hemoglobin: 11.4 g/dL — ABNORMAL LOW (ref 12.0–15.0)
MCH: 31.5 pg (ref 26.0–34.0)
MCHC: 33.5 g/dL (ref 30.0–36.0)
MCV: 93.9 fL (ref 80.0–100.0)
Platelets: 177 10*3/uL (ref 150–400)
RBC: 3.62 MIL/uL — ABNORMAL LOW (ref 3.87–5.11)
RDW: 13 % (ref 11.5–15.5)
WBC: 10 10*3/uL (ref 4.0–10.5)
nRBC: 0 % (ref 0.0–0.2)

## 2020-08-23 LAB — COMPREHENSIVE METABOLIC PANEL
ALT: 25 U/L (ref 0–44)
AST: 32 U/L (ref 15–41)
Albumin: 4.1 g/dL (ref 3.5–5.0)
Alkaline Phosphatase: 57 U/L (ref 38–126)
Anion gap: 16 — ABNORMAL HIGH (ref 5–15)
BUN: 32 mg/dL — ABNORMAL HIGH (ref 8–23)
CO2: 28 mmol/L (ref 22–32)
Calcium: 8.8 mg/dL — ABNORMAL LOW (ref 8.9–10.3)
Chloride: 99 mmol/L (ref 98–111)
Creatinine, Ser: 2.52 mg/dL — ABNORMAL HIGH (ref 0.44–1.00)
GFR calc Af Amer: 20 mL/min — ABNORMAL LOW (ref >60)
GFR calc non Af Amer: 18 mL/min — ABNORMAL LOW (ref >60)
Glucose, Bld: 112 mg/dL — ABNORMAL HIGH (ref 70–99)
Potassium: 3.7 mmol/L (ref 3.5–5.1)
Sodium: 143 mmol/L (ref 135–145)
Total Bilirubin: 1.2 mg/dL (ref 0.3–1.2)
Total Protein: 6.7 g/dL (ref 6.5–8.1)

## 2020-08-23 LAB — SARS CORONAVIRUS 2 BY RT PCR (HOSPITAL ORDER, PERFORMED IN ~~LOC~~ HOSPITAL LAB): SARS Coronavirus 2: NEGATIVE

## 2020-08-23 MED ORDER — PROMETHAZINE HCL 25 MG/ML IJ SOLN: 12.5000 mg | Freq: Four times a day (QID) | INTRAMUSCULAR | Status: DC | PRN

## 2020-08-23 MED ORDER — ONDANSETRON HCL 4 MG PO TABS: 4.0000 mg | ORAL_TABLET | Freq: Four times a day (QID) | ORAL | Status: DC | PRN

## 2020-08-23 MED ORDER — ONDANSETRON HCL 4 MG/2ML IJ SOLN
4.0000 mg | Freq: Four times a day (QID) | INTRAMUSCULAR | Status: DC | PRN
  Administered 2020-08-24 – 2020-08-27 (×2): 4 mg via INTRAVENOUS
  Filled 2020-08-23: qty 2

## 2020-08-23 MED ORDER — ROSUVASTATIN CALCIUM 20 MG PO TABS
40.0000 mg | ORAL_TABLET | Freq: Every day | ORAL | Status: DC
  Administered 2020-08-24 – 2020-08-27 (×4): 40 mg via ORAL
  Filled 2020-08-23 (×4): qty 2

## 2020-08-23 MED ORDER — APIXABAN 5 MG PO TABS
5.0000 mg | ORAL_TABLET | Freq: Two times a day (BID) | ORAL | Status: DC
  Administered 2020-08-24 – 2020-08-28 (×9): 5 mg via ORAL
  Filled 2020-08-23 (×10): qty 1

## 2020-08-23 MED ORDER — LACTATED RINGERS IV BOLUS
1000.0000 mL | Freq: Once | INTRAVENOUS | Status: AC
  Administered 2020-08-23: 1000 mL via INTRAVENOUS

## 2020-08-23 MED ORDER — SODIUM CHLORIDE 0.9 % IV BOLUS
1000.0000 mL | Freq: Once | INTRAVENOUS | Status: AC
  Administered 2020-08-23: 1000 mL via INTRAVENOUS

## 2020-08-23 MED ORDER — LEVOTHYROXINE SODIUM 75 MCG PO TABS
150.0000 ug | ORAL_TABLET | Freq: Every day | ORAL | Status: DC
  Administered 2020-08-24 – 2020-08-28 (×5): 150 ug via ORAL
  Filled 2020-08-23 (×3): qty 2
  Filled 2020-08-23: qty 1
  Filled 2020-08-23: qty 2

## 2020-08-23 MED ORDER — ACETAMINOPHEN 650 MG RE SUPP: 650.0000 mg | Freq: Four times a day (QID) | RECTAL | Status: DC | PRN

## 2020-08-23 MED ORDER — ACETAMINOPHEN 325 MG PO TABS
650.0000 mg | ORAL_TABLET | Freq: Four times a day (QID) | ORAL | Status: DC | PRN
  Administered 2020-08-28: 650 mg via ORAL
  Filled 2020-08-23: qty 2

## 2020-08-23 MED ORDER — SODIUM CHLORIDE 0.9% FLUSH
3.0000 mL | Freq: Two times a day (BID) | INTRAVENOUS | Status: DC
  Administered 2020-08-24 – 2020-08-28 (×6): 3 mL via INTRAVENOUS

## 2020-08-23 MED ORDER — POTASSIUM CHLORIDE 2 MEQ/ML IV SOLN
INTRAVENOUS | Status: DC
  Filled 2020-08-23 (×4): qty 1000

## 2020-08-23 NOTE — H&P (Signed)
History and Physical    Lauren Henry:[id number] DOB: [birth year] DOA: 08/23/2020  PCP: Gaspar Garbe, MD  Patient coming from: Home  I have personally briefly reviewed patient's old medical records in [facility]  Chief Complaint: Nausea, vomiting, diarrhea  HPI: Lauren Henry is a 79 y.o. female with medical history significant for PAF on Eliquis, CHB s/p PPM, CAD, CKD stage IIIb, HTN, HLD, and hypothyroidism who presents to the ED for recurrent nausea, vomiting, and diarrhea.  Patient was recently admitted 08/20/2020-08/22/2020 for acute on chronic kidney injury secondary to dehydration from about 1.5 weeks of nausea, vomiting, and diarrhea.  She was treated supportively with aggressive IV fluid hydration.  Creatinine trending down from 3.22 >> 2.24 on day of discharge (baseline 1.5-1.7).  She was tolerating a diet and had no further diarrhea at time of discharge.  She says she did well all day yesterday (9/1) at home.  She had a couple bites of lasagna for dinner.  This morning when she woke up she had recurrence of frequent nausea, vomiting, and watery diarrhea.  She tried to hydrate with Gatorade but was unable to able to drink half a bottle before symptoms flareup.  She has some associated lightheadedness without syncope or fall.  She came back to the ED for further evaluation.  She says while in the waiting room she spent nearly 2 hours in the bathroom due to ongoing diarrhea.  She otherwise denies any subjective fevers, chills, diaphoresis, chest pain, palpitations, dyspnea, abdominal pain, dysuria, or obvious bleeding including hematochezia or melena.  She lives with her husband who has not had any similar symptoms.  She says her PCP with [facility] did obtain a stool sample on 8/30 but they have not been called with results yet.  She says she was also given an antibiotic empirically for her diarrhea but she is not sure which one.  ED Course:  Initial vitals showed  BP 136/61, pulse 72, RR 14, temp 98.5 Fahrenheit, SPO2 98% on room air.  While in the ED she became hypotensive to 93/53.  Labs showed creatinine 2.52, BUN 32, sodium 143, potassium 3.7, bicarb 28, serum glucose 112, LFTs within normal limits, WBC 10.0, hemoglobin 11.4, platelets 177,000.  C. difficile and GI pathogen panels were ordered and pending.  Patient was given 1 L of normal saline and the hospitalist service was consulted to admit for further evaluation and management.  Review of Systems: All systems reviewed and are negative except as documented in history of present illness above.   Past Medical History:  Diagnosis Date  . Arthritis   . Atrial fibrillation (HCC)   . Diarrhea    since gallbladder removed  . 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 History of blood transfusion as a child   no abnormal reaction noted  . History of colon polyps   . Hyperlipidemia    takes Pravastatin daily  . Hypertension    takes Azor and Metoprolol daily  . Hypothyroidism    takes Synthroid daily  . Joint pain   . Presence of permanent cardiac pacemaker   . Urinary frequency   . Urinary urgency     Past Surgical History:  Procedure Laterality Date  . ABDOMINAL HYSTERECTOMY    . cataract surgery Bilateral   . CHOLECYSTECTOMY  1971  . COLONOSCOPY    . EYE SURGERY    . INSERT / REPLACE /  REMOVE PACEMAKER     Medtronic PPM 11/01/14 (Dr. Lewayne Bunting)  . JOINT REPLACEMENT Right    knee  . LEFT HEART CATH AND CORONARY ANGIOGRAPHY N/A 11/01/2018   Procedure: LEFT HEART CATH AND CORONARY ANGIOGRAPHY;  Surgeon: Yvonne Kendall, MD;  [facility]: [facility] INVAS[facility] CV LAB;  Service: Cardiovascular;  Laterality: N/A;  . PERMANENT PACEMAKER INSERTION N/A 11/01/2014   Procedure: PERMANENT PACEMAKER INSERTION;  Surgeon: Marinus Maw, MD;  Location: [facility] CATH LAB;  Service: Cardiovascular;  Laterality: N/A;  . stomach stapled  1985  . TOTAL KNEE  ARTHROPLASTY Left 11/05/2015   Procedure: TOTAL KNEE ARTHROPLASTY;  Surgeon: Dannielle Huh, MD;  Location: [facility] OR;  Service: Orthopedics;  Laterality: Left;    Social History:  reports that she has never smoked. She has never used smokeless tobacco. She reports current alcohol use of about 1.0 standard drink of alcohol per week. She reports that she does not use drugs.  Allergies  Allergen Reactions  . Amlodipine Swelling and Rash    Family History  Problem Relation Age of Onset  . Heart disease Father   . Heart attack Father   . Stroke Sister      Prior to Admission medications   Medication Sig Start Date End Date Taking? Authorizing Provider  apixaban (ELIQUIS) 5 MG TABS tablet Take 1 tablet (5 mg total) by mouth 2 (two) times daily. 02/17/20   Chilton Si, MD  carvedilol (COREG CR) 40 MG 24 hr capsule Take 40 mg by mouth daily.    [provider]  cloNIDine (CATAPRES) 0.2 MG tablet TAKE 1 TABLET(0.2MG ) BY MOUTH AT BEDTIME. HOLD IF SYSTOLIC BLOOD PRESSURE LESS THAN 200 Patient taking differently: Take 0.1 mg by mouth at bedtime. Hold is systolic blood pressure less than 200 06/26/20   Chilton Si, MD  ferrous sulfate 325 (65 FE) MG tablet Take 325 mg by mouth daily with breakfast.    [provider]  hydrALAZINE (APRESOLINE) 50 MG tablet Take 50 mg by mouth See admin instructions. 50 Mg in the Am, 100 MG at lunch and 50 MG at night    [provider]  levothyroxine (SYNTHROID, LEVOTHROID) 150 MCG tablet Take 150 mcg by mouth daily before breakfast.  10/15/17   [provider]  mirtazapine (REMERON) 7.5 MG tablet Take 7.5 mg by mouth at bedtime.  11/03/18   [provider]  Multiple Vitamin (MULTIVITAMIN WITH MINERALS) TABS tablet Take 1 tablet by mouth daily.    [provider]  olmesartan (BENICAR) 40 MG tablet Take 1 tablet (40 mg total) by mouth daily. 03/15/20   Chilton Si, MD  ondansetron (ZOFRAN-ODT) 4 MG  disintegrating tablet Take 4 mg by mouth every 8 (eight) hours as needed for nausea or vomiting.    [provider]  pantoprazole (PROTONIX) 40 MG tablet Take 40 mg by mouth daily. 11/10/18   [provider]  rosuvastatin (CRESTOR) 40 MG tablet TAKE 1 TABLET BY MOUTH DAILY 12/27/19   Chilton Si, MD  spironolactone (ALDACTONE) 25 MG tablet Take 1 tablet (25 mg total) by mouth daily. 03/15/20   Chilton Si, MD  vitamin C (ASCORBIC ACID) 250 MG tablet Take 250 mg by mouth daily.    [provider]  VITAMIN D, CHOLECALCIFEROL, PO Take 1 tablet by mouth daily.    [provider]    Physical Exam: Vitals:   08/23/20 1417 08/23/20 1724 08/23/20 1801 08/23/20 1819  BP: 109/67 136/61 100/86 (!) 93/53  Pulse: 94 68  70 63  Resp: 20 14 18 16   Temp: 98.2 F (36.8 C) 98.5 F (36.9 C)  98.5 F (36.9 C)  TempSrc: Oral Oral  Oral  SpO2: 96% 98% 98% 91%   Constitutional: Resting supine in bed, NAD, calm, appears tired but comfortable Eyes: PERRL, lids and conjunctivae normal ENMT: Mucous membranes are dry. Posterior pharynx clear of any exudate or lesions.Normal dentition.  Neck: normal, supple, no masses. Respiratory: clear to auscultation bilaterally, no wheezing, no crackles. Normal respiratory effort. No accessory muscle use.  Cardiovascular: Regular rate and rhythm, systolic murmur present. No extremity edema. 2+ pedal pulses.  PPM in place. Abdomen: no tenderness, no masses palpated. No hepatosplenomegaly. Bowel sounds positive.  Musculoskeletal: no clubbing / cyanosis. No joint deformity upper and lower extremities. Good ROM, no contractures. Normal muscle tone.  Skin: no rashes, lesions, ulcers. No induration Neurologic: CN 2-12 grossly intact. Sensation intact, Strength 5/5 in all 4.  Psychiatric: Normal judgment and insight. Alert and oriented x 3. Normal mood.   Labs on Admission: I have personally reviewed following labs and imaging  studies  CBC: Recent Labs  Lab 08/20/20 1654 08/21/20 0324 08/22/20 0452 08/23/20 1432  WBC 8.1 5.8 5.6 10.0  NEUTROABS  --   --  3.4  --   HGB 11.1* 9.3* 9.3* 11.4*  HCT 33.9* 27.6* 27.2* 34.0*  MCV 95.0 93.9 91.9 93.9  PLT 176 143* 144* 177   Basic Metabolic Panel: Recent Labs  Lab 08/20/20 1654 08/21/20 0324 08/22/20 0452 08/23/20 1432  NA 137 138 142 143  K 4.6 5.0 3.8 3.7  CL 110 113* 104 99  CO2 13* 15* 27 28  GLUCOSE 111* 83 82 112*  BUN 45* 45* 33* 32*  CREATININE 3.22* 2.97* 2.24* 2.52*  CALCIUM 9.6 8.9 8.6* 8.8*  MG  --   --  1.4*  --    GFR: Estimated Creatinine Clearance: 19.8 mL/min (A) (by C-G formula based on SCr of 2.52 mg/dL (H)). Liver Function Tests: Recent Labs  Lab 08/20/20 1654 08/21/20 0324 08/23/20 1432  AST 17 18 32  ALT 18 17 25   ALKPHOS 58 47 57  BILITOT 0.7 0.4 1.2  PROT 7.3 6.0* 6.7  ALBUMIN 4.0 3.3* 4.1   Recent Labs  Lab 08/20/20 1654  LIPASE 47   No results for input(s): AMMONIA in the last 168 hours. Coagulation Profile: No results for input(s): INR, PROTIME in the last 168 hours. Cardiac Enzymes: No results for input(s): CKTOTAL, CKMB, CKMBINDEX, TROPONINI in the last 168 hours. BNP (last 3 results) No results for input(s): PROBNP in the last 8760 hours. HbA1C: No results for input(s): HGBA1C in the last 72 hours. CBG: No results for input(s): GLUCAP in the last 168 hours. Lipid Profile: No results for input(s): CHOL, HDL, LDLCALC, TRIG, CHOLHDL, LDLDIRECT in the last 72 hours. Thyroid Function Tests: No results for input(s): TSH, T4TOTAL, FREET4, T3FREE, THYROIDAB in the last 72 hours. Anemia Panel: No results for input(s): VITAMINB12, FOLATE, FERRITIN, TIBC, IRON, RETICCTPCT in the last 72 hours. Urine analysis:    Component Value Date/Time   COLORURINE YELLOW 08/20/2020 2330   APPEARANCEUR CLEAR 08/20/2020 2330   LABSPEC 1.008 08/20/2020 2330   PHURINE 5.0 08/20/2020 2330   GLUCOSEU NEGATIVE 08/20/2020  2330   HGBUR SMALL (A) 08/20/2020 2330   BILIRUBINUR NEGATIVE 08/20/2020 2330   KETONESUR NEGATIVE 08/20/2020 2330   PROTEINUR NEGATIVE 08/20/2020 2330   UROBILINOGEN 0.2 10/26/2015 0957   NITRITE NEGATIVE 08/20/2020 2330   LEUKOCYTESUR  TRACE (A) 08/20/2020 2330    Radiological Exams on Admission: No results found.  EKG: Ordered and pending.  Assessment/Plan Principal Problem:   Nausea vomiting and diarrhea Active Problems:   Essential hypertension   Hyperlipidemia   Hypothyroidism   PAF (paroxysmal atrial fibrillation) (HCC)   CAD in native artery   Acute renal failure superimposed on stage 3b chronic kidney disease (HCC)  Lauren Henry is a 79 y.o. female with medical history significant for PAF on Eliquis, CHB s/p PPM, CAD, CKD stage IIIb, HTN, HLD, and hypothyroidism who is admitted with recurrent nausea, vomiting, and diarrhea.  Nausea/vomiting/diarrhea: Recent admit for same and managed supportively with transient improvement.  Stool samples obtained 8/30 by [facility] per patient, no results given yet. -Give 1 L LR bolus now followed by maintenance IV fluids overnight -Obtain CT abdomen, no IV contrast given AKI -Follow C. difficile and GI pathogen panels -Check lipase, TSH -Antiemetics as needed  AKI on CKD stage IIIb: Baseline creatinine 1.5-1.7.  Recent admit creatinine trended 3.22-> 2.24 on d/c (9/1) and repeat creatinine up to 2.52 this admission (9/2).  Again likely prerenal due to dehydration from GI losses.  Also takes olmesartan and spironolactone at home. -Continue IV fluid hydration as above -Hold home olmesartan spironolactone -Check urine studies -Obtain renal ultrasound  Paroxysmal atrial fibrillation CHB s/p PPM: In paced rhythm on admission. CHA2DS2-VASc score 6, on chronic anticoagulation with Eliquis. -Continue Eliquis -Holding home carvedilol due to hypotension, add back as able  Hypertension: Holding home carvedilol, clonidine,  hydralazine, olmesartan, spironolactone due to hypotension on admission.  CAD: Chronic and stable.  Continue Eliquis and rosuvastatin.  Hypothyroidism: Continue Synthroid, check TSH.  Hyperlipidemia: Continue rosuvastatin.  DVT prophylaxis: Eliquis Code Status: Full code, confirmed with patient Family Communication: Discussed with patient's husband at bedside Disposition Plan: From home and likely discharge to home when symptoms improve and able to maintain adequate oral intake Consults called: None Admission status:  Status is: Observation  The patient remains OBS appropriate and will d/c before 2 midnights.  Dispo: The patient is from: Home              Anticipated d/c is to: Home              Anticipated d/c date is: 1 day              Patient currently is not medically stable to d/c.   Darreld Mclean MD [facility]  If 7PM-7AM, please contact night-coverage [email or web address]  08/23/2020, 7:22 PM

## 2020-08-23 NOTE — ED Provider Notes (Signed)
[facility]-EMERGENCY DEPT Provider Note   CSN: [id number] Arrival date & time: 08/23/20  1335     History Chief Complaint  Patient presents with   Diarrhea    Lauren Henry is a 79 y.o. female with past medical history of hypertension, hypothyroidism, atrial fibrillation on Eliquis, complete heart block with permanent pacemaker, presenting to the emergency department with complaint of recurrent nausea, vomiting, diarrhea.  Patient was admitted on 08/20/2020 for week and a half with nausea, vomiting, diarrhea with acute kidney injury.  She was discharged yesterday with downtrending creatinine and improving vomiting and diarrhea, however soon after she returned home she began feeling unwell again.  Her diarrhea returned.  She states she is been on the toilet in the waiting room for the last 2 hours for diarrhea.  She is also having vomiting which seems somewhat improved with Zofran.  She denies associated abdominal pain, fevers, urinary symptoms.  She states her symptoms initially started 2 weeks ago.  Her PCP instructed her to increase her fiber intake.  She states last Monday they started her on an antibiotic, she is unsure of the name, however her symptoms did not improve.    The history is provided by the patient and medical records.       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; atrial fib (PAF)   History of blood transfusion as a child   no abnormal reaction noted   History of colon polyps    Hyperlipidemia    takes Pravastatin daily   Hypertension    takes Azor and Metoprolol daily   Hypothyroidism    takes Synthroid daily   Joint pain    Presence of permanent cardiac pacemaker    Urinary frequency    Urinary urgency     Patient Active Problem List   Diagnosis Date Noted   Nausea  vomiting and diarrhea 08/23/2020   Acute renal failure superimposed on stage 3b chronic kidney disease (HCC) 08/20/2020   Chronic anticoagulation 11/24/2018   CAD in native artery 11/24/2018   Chronic renal disease, stage III 11/24/2018   Anemia 11/24/2018   Elevated troponin    Urinary urgency    Urinary frequency    Presence of permanent cardiac pacemaker    Joint pain    History of colon polyps    History of blood transfusion    Diarrhea    Arthritis    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   Preop cardiovascular exam 10/18/2015   Chest pain 11/09/2014   PAF (paroxysmal atrial fibrillation) (HCC) 11/09/2014   Essential hypertension 10/30/2014   Hyperlipidemia 10/30/2014   Hypothyroidism 10/30/2014   CHB (complete heart block) (HCC) 10/30/2014   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 Lewayne Bunting)   JOINT REPLACEMENT Right    knee   LEFT HEART  CATH AND CORONARY ANGIOGRAPHY N/A 11/01/2018   Procedure: LEFT HEART CATH AND CORONARY ANGIOGRAPHY;  Surgeon: Yvonne KendallEnd, Christopher, MD;  Location: [facility] INVASIVE CV LAB;  Service: Cardiovascular;  Laterality: N/A;   PERMANENT PACEMAKER INSERTION N/A 11/01/2014   Procedure: PERMANENT PACEMAKER INSERTION;  Surgeon: Marinus MawGregg W Taylor, MD;  [facility];  Service: Cardiovascular;  Laterality: N/A;   stomach stapled  1985   TOTAL KNEE ARTHROPLASTY Left 11/05/2015   Procedure: TOTAL KNEE ARTHROPLASTY;  Surgeon: Dannielle HuhSteve Lucey, MD;  Location: [facility] OR;  Service: Orthopedics;  Laterality: Left;     OB History     No obstetric history on file.     Family History  Problem Relation Age of Onset   Heart disease Father    Heart attack Father    Stroke Sister     Social History   Tobacco Use   Smoking status: Never Smoker   Smokeless tobacco: Never Used  Vaping Use   Vaping Use: Never used  Substance Use Topics   Alcohol use: Yes    Alcohol/week: 1.0 standard drink    Types: 1 Glasses of wine per week   Drug use: No    Home Medications Prior to Admission medications   Medication Sig Start Date End Date Taking? Authorizing Provider  apixaban (ELIQUIS) 5 MG TABS tablet Take 1 tablet (5 mg total) by mouth 2 (two) times daily. 02/17/20   Chilton Siandolph, Tiffany, MD  carvedilol (COREG CR) 40 MG 24 hr capsule Take 40 mg by mouth daily.    [provider]  cloNIDine (CATAPRES) 0.2 MG tablet TAKE 1 TABLET(0.2MG ) BY MOUTH AT BEDTIME. HOLD IF SYSTOLIC BLOOD PRESSURE LESS THAN 200 Patient taking differently: Take 0.1 mg by mouth at bedtime. Hold is systolic blood pressure less than 200 06/26/20   Chilton Siandolph, Tiffany, MD  ferrous sulfate 325 (65 FE) MG tablet Take 325 mg by mouth daily with breakfast.    [provider]  hydrALAZINE (APRESOLINE) 50 MG tablet Take 50 mg by mouth See admin instructions. 50 Mg in the Am, 100 MG at lunch and 50 MG at night    [provider]  levothyroxine (SYNTHROID, LEVOTHROID) 150 MCG tablet Take 150 mcg by mouth daily before breakfast.  10/15/17   [provider]  mirtazapine (REMERON) 7.5 MG tablet Take 7.5 mg by mouth at bedtime.  11/03/18   [provider]  Multiple Vitamin (MULTIVITAMIN WITH MINERALS) TABS tablet Take 1 tablet by mouth daily.    [provider]  olmesartan (BENICAR) 40 MG tablet Take 1 tablet (40 mg total) by mouth daily. 03/15/20   Chilton Siandolph, Tiffany, MD  ondansetron (ZOFRAN-ODT) 4 MG disintegrating tablet Take 4 mg by mouth every 8 (eight) hours as needed for nausea or vomiting.    [provider]  pantoprazole (PROTONIX) 40 MG tablet Take 40 mg by mouth daily. 11/10/18   [provider]  rosuvastatin (CRESTOR) 40 MG tablet TAKE 1 TABLET BY MOUTH DAILY 12/27/19   Chilton Siandolph, Tiffany, MD  spironolactone (ALDACTONE) 25 MG tablet Take 1 tablet (25 mg total) by mouth daily. 03/15/20   Chilton Siandolph, Tiffany, MD  vitamin C (ASCORBIC ACID) 250 MG tablet Take 250 mg by mouth daily.    [provider]  VITAMIN D, CHOLECALCIFEROL, PO Take 1 tablet by mouth daily.    [provider]    Allergies    Amlodipine  Review of Systems   Review of Systems  Gastrointestinal: Positive for diarrhea, nausea and vomiting.  All other systems reviewed and are negative.   Physical Exam Updated Vital Signs BP (!) 93/53 (BP Location: Right Arm)    Pulse 63    Temp 98.5 F (36.9 C) (Oral)    Resp 16    SpO2 91%   Physical Exam Vitals and nursing note reviewed.  Constitutional:      General: She is not in acute distress.    Appearance: She is well-developed.  HENT:     Head: Normocephalic and atraumatic.  Eyes:     Conjunctiva/sclera: Conjunctivae normal.  Cardiovascular:     Rate and Rhythm: Normal rate.  Pulmonary:     Effort: Pulmonary effort is normal. No respiratory distress.     Breath sounds: Normal breath sounds.  Abdominal:     General: Bowel sounds are normal.     Palpations: Abdomen is soft.     Tenderness: There is no abdominal tenderness. There is no guarding or rebound.  Skin:    General: Skin is warm.  Neurological:     Mental Status: She is alert.  Psychiatric:        Behavior: Behavior normal.     ED Results / Procedures / Treatments   Labs (all labs ordered are listed, but only abnormal results are displayed) Labs Reviewed  COMPREHENSIVE METABOLIC PANEL - Abnormal; Notable for the following components:      Result Value   Glucose, Bld 112 (*)    BUN 32 (*)    Creatinine, Ser 2.52 (*)    Calcium 8.8 (*)    GFR calc non Af Amer 18  (*)    GFR calc Af Amer 20 (*)    Anion gap 16 (*)    All other components within normal limits  CBC - Abnormal; Notable for the following components:   RBC 3.62 (*)    Hemoglobin 11.4 (*)    HCT 34.0 (*)    All other components within normal limits  GASTROINTESTINAL PANEL BY PCR, STOOL (REPLACES STOOL CULTURE)  C DIFFICILE QUICK SCREEN W PCR REFLEX  SARS CORONAVIRUS 2 BY RT PCR (HOSPITAL ORDER, PERFORMED IN [place] [facility])  URINALYSIS, ROUTINE W REFLEX MICROSCOPIC  MAGNESIUM  SODIUM, URINE, RANDOM  CREATININE, URINE, RANDOM  UREA NITROGEN, URINE  TSH  LIPASE, BLOOD  CBC  BASIC METABOLIC PANEL    EKG None  Radiology No results found.  Procedures Procedures (including critical care time)  Medications Ordered in ED Medications  sodium chloride flush (NS) 0.9 % injection 3 mL (has no administration in time range)  acetaminophen (TYLENOL) tablet 650 mg (has no administration in time range)    Or  acetaminophen (TYLENOL) suppository 650 mg (has no administration in time range)  ondansetron (ZOFRAN) tablet 4 mg (has no administration in time range)    Or  ondansetron (ZOFRAN) injection 4 mg (has no administration in time range)  lactated ringers bolus 1,000 mL (has no administration in time range)  lactated ringers 1,000 mL with potassium chloride 40 mEq infusion (has no administration in time range)  promethazine (PHENERGAN) injection 12.5 mg (has no administration in time range)  apixaban (ELIQUIS) tablet 5 mg (has no administration in time range)  levothyroxine (SYNTHROID) tablet 150 mcg (has no administration in time range)  rosuvastatin (CRESTOR) tablet 40 mg (has no administration in time range)  sodium chloride 0.9 % bolus 1,000 mL (0 mLs Intravenous Stopped 08/23/20 1902)    ED Course  I have reviewed the triage vital signs and the nursing  notes.  Pertinent labs & imaging results that were available during my care of the patient were reviewed by me and  considered in my medical decision making (see chart for details).    MDM Rules/Calculators/A&P                          Patient presenting to the ED after discharge yesterday.  She was admitted for acute kidney injury in the setting of nausea, vomiting, diarrhea x2 weeks.  PCP treated with an antibiotic without improvement.  Her creatinine on admission was 3.22, and at discharge it was 2.24.  She states her vomiting and diarrhea had gradually improved upon discharge, however when she returned home she gradually began feeling ill again.  She has had multiple episodes of diarrhea and vomiting since yesterday and is feeling ill once more.  She denies associated abdominal pain.  On exam, blood pressure is low normal, afebrile, normal heart rate.  Abdomen is benign.  She is given IV fluids, GI panel sent.  Creatinine today is uptrending once again to 2.52, BUN of 32.  Consulted with hospitalist, patient will be admitted for further management.  The patient appears reasonably stabilized for admission considering the current resources, flow, and capabilities available in the ED at this time, and I doubt any other [facility] requiring further screening and/or treatment in the ED prior to admission.   Final Clinical Impression(s) / ED Diagnoses Final diagnoses:  Acute kidney injury superimposed on CKD [facility])    Rx / DC Orders ED Discharge Orders    None       Yadira Hada, [place] N, PA-C 08/23/20 1924    Vanetta Mulders, MD 09/06/20 1144

## 2020-08-23 NOTE — ED Notes (Signed)
Pt went straight to restroom after being registered and she is still in restroom. Pt has been checked on to be sure she is ok

## 2020-08-23 NOTE — ED Triage Notes (Signed)
Pt arrived via POV, c/o n/v and diarrhea. Was just discharged for AKI and n/v diarrhea yesterday, states she was feeling better and then got worse this morning.

## 2020-08-24 ENCOUNTER — Observation Stay (HOSPITAL_COMMUNITY): Payer: Medicare PPO

## 2020-08-24 DIAGNOSIS — N1832 Chronic kidney disease, stage 3b: Secondary | ICD-10-CM

## 2020-08-24 DIAGNOSIS — N179 Acute kidney failure, unspecified: Principal | ICD-10-CM

## 2020-08-24 DIAGNOSIS — I480 Paroxysmal atrial fibrillation: Secondary | ICD-10-CM

## 2020-08-24 DIAGNOSIS — R112 Nausea with vomiting, unspecified: Secondary | ICD-10-CM | POA: Diagnosis not present

## 2020-08-24 LAB — GASTROINTESTINAL PANEL BY PCR, STOOL (REPLACES STOOL CULTURE)

## 2020-08-24 LAB — CBC
HCT: 27.7 % — ABNORMAL LOW (ref 36.0–46.0)
Hemoglobin: 9 g/dL — ABNORMAL LOW (ref 12.0–15.0)
MCH: 31.3 pg (ref 26.0–34.0)
MCHC: 32.5 g/dL (ref 30.0–36.0)
MCV: 96.2 fL (ref 80.0–100.0)
Platelets: 133 10*3/uL — ABNORMAL LOW (ref 150–400)
RBC: 2.88 MIL/uL — ABNORMAL LOW (ref 3.87–5.11)
RDW: 13.1 % (ref 11.5–15.5)
WBC: 4.6 10*3/uL (ref 4.0–10.5)
nRBC: 0 % (ref 0.0–0.2)

## 2020-08-24 LAB — BASIC METABOLIC PANEL
Anion gap: 14 (ref 5–15)
BUN: 34 mg/dL — ABNORMAL HIGH (ref 8–23)
CO2: 23 mmol/L (ref 22–32)
Calcium: 8 mg/dL — ABNORMAL LOW (ref 8.9–10.3)
Chloride: 104 mmol/L (ref 98–111)
Creatinine, Ser: 2.45 mg/dL — ABNORMAL HIGH (ref 0.44–1.00)
GFR calc Af Amer: 21 mL/min — ABNORMAL LOW (ref >60)
GFR calc non Af Amer: 18 mL/min — ABNORMAL LOW (ref >60)
Glucose, Bld: 58 mg/dL — ABNORMAL LOW (ref 70–99)
Potassium: 4 mmol/L (ref 3.5–5.1)
Sodium: 141 mmol/L (ref 135–145)

## 2020-08-24 LAB — URINALYSIS, ROUTINE W REFLEX MICROSCOPIC
Bilirubin Urine: NEGATIVE
Glucose, UA: NEGATIVE mg/dL
Hgb urine dipstick: NEGATIVE
Ketones, ur: 5 mg/dL — AB
Leukocytes,Ua: NEGATIVE
Nitrite: NEGATIVE
Protein, ur: 100 mg/dL — AB
Specific Gravity, Urine: 1.018 (ref 1.005–1.030)
pH: 5 (ref 5.0–8.0)

## 2020-08-24 LAB — SODIUM, URINE, RANDOM: Sodium, Ur: 42 mmol/L

## 2020-08-24 LAB — CREATININE, URINE, RANDOM: Creatinine, Urine: 256.72 mg/dL

## 2020-08-24 LAB — CBG MONITORING, ED
Glucose-Capillary: 76 mg/dL (ref 70–99)
Glucose-Capillary: 86 mg/dL (ref 70–99)
Glucose-Capillary: 108 mg/dL — ABNORMAL HIGH (ref 70–99)
Glucose-Capillary: 98 mg/dL (ref 70–99)

## 2020-08-24 LAB — C DIFFICILE QUICK SCREEN W PCR REFLEX
C Diff antigen: NEGATIVE
C Diff interpretation: NOT DETECTED
C Diff toxin: NEGATIVE

## 2020-08-24 MED ORDER — SODIUM CHLORIDE 0.9 % IV SOLN: 250.0000 mL | INTRAVENOUS | Status: DC | PRN

## 2020-08-24 MED ORDER — SODIUM CHLORIDE 0.9% FLUSH: 3.0000 mL | INTRAVENOUS | Status: DC | PRN

## 2020-08-24 MED ORDER — DEXTROSE IN LACTATED RINGERS 5 % IV SOLN: INTRAVENOUS | Status: DC

## 2020-08-24 MED ORDER — SODIUM CHLORIDE 0.9% FLUSH
3.0000 mL | Freq: Two times a day (BID) | INTRAVENOUS | Status: DC
  Administered 2020-08-24 – 2020-08-28 (×7): 3 mL via INTRAVENOUS

## 2020-08-24 NOTE — ED Notes (Signed)
ED TO INPATIENT HANDOFF REPORT  Name/Age/Gender Lauren Henry 79 y.o. female  Code Status    Code Status Orders  (From admission, onward)         Start     Ordered   08/23/20 1907  Full code  Continuous        08/23/20 1908        Code Status History    Date Active Date Inactive Code Status Order ID Comments User Context   08/21/2020 0038 08/22/2020 1941 Full Code [id number]  Rometta Emery, MD ED   10/30/2018 1734 11/03/2018 1511 Full Code [id number]  Barnetta Chapel, MD Inpatient   11/05/2015 1226 11/06/2015 1752 Full Code [id number]  Dan Humphreys, PA-C Inpatient   11/01/2014 1721 11/02/2014 1740 Full Code [id number]  Marinus Maw, MD Inpatient   10/30/2014 0941 11/01/2014 1721 Full Code [id number]  Lennette Bihari, MD ED   Advance Care Planning Activity    Advance Directive Documentation     Most Recent Value  Type of Advance Directive Healthcare Power of Attorney, Living will  Pre-existing out of facility DNR order (yellow form or pink MOST form) --  "MOST" Form in Place? --      Home/SNF/Other Home  Chief Complaint Nausea vomiting and diarrhea [R11.2, R19.7]  Level of Care/Admitting Diagnosis ED Disposition    ED Disposition Condition Comment   Admit  Hospital Area: [facility] [[id number]]  Level of Care: Telemetry [5]  Admit to tele based on following criteria: Complex arrhythmia (Bradycardia/Tachycardia)  Covid Evaluation: Asymptomatic Screening Protocol (No Symptoms)  Diagnosis: Nausea vomiting and diarrhea [196222]  Admitting Physician: Charlsie Quest [[id number]]  Attending Physician: Charlsie Quest [[id number]]       Medical History Past Medical History:  Diagnosis Date  . Arthritis   . Atrial fibrillation (HCC)   . Diarrhea    since gallbladder removed  . 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 History of blood transfusion as a child   no abnormal reaction noted  . History  of colon polyps   . Hyperlipidemia    takes Pravastatin daily  . Hypertension    takes Azor and Metoprolol daily  . Hypothyroidism    takes Synthroid daily  . Joint pain   . Presence of permanent cardiac pacemaker   . Urinary frequency   . Urinary urgency     Allergies Allergies  Allergen Reactions  . Amlodipine Swelling and Rash    IV Location/Drains/Wounds Patient Lines/Drains/Airways Status    Active Line/Drains/Airways    Name Placement date Placement time Site Days   Peripheral IV 08/23/20 Right Antecubital 08/23/20  --  Antecubital  1   Incision (Closed) 11/01/14 Chest Left;Upper 11/01/14  1723   2123   Incision (Closed) 11/05/15 Knee Left 11/05/15  0951   1754          Labs/Imaging Results for orders placed or performed during the hospital encounter of 08/23/20 (from the past 48 hour(s))  Comprehensive metabolic panel     Status: Abnormal   Collection Time: 08/23/20  2:32 PM  Result Value Ref Range   Sodium 143 135 - 145 mmol/L   Potassium 3.7 3.5 - 5.1 mmol/L   Chloride 99 98 - 111 mmol/L   CO2 28 22 - 32 mmol/L   Glucose, Bld 112 (H) 70 - 99 mg/dL    Comment: Glucose reference range applies only to samples taken  after fasting for at least 8 hours.   BUN 32 (H) 8 - 23 mg/dL   Creatinine, Ser 0.10 (H) 0.44 - 1.00 mg/dL   Calcium 8.8 (L) 8.9 - 10.3 mg/dL   Total Protein 6.7 6.5 - 8.1 g/dL   Albumin 4.1 3.5 - 5.0 g/dL   AST 32 15 - 41 U/L   ALT 25 0 - 44 U/L   Alkaline Phosphatase 57 38 - 126 U/L   Total Bilirubin 1.2 0.3 - 1.2 mg/dL   GFR calc non Af Amer 18 (L) >60 mL/min   GFR calc Af Amer 20 (L) >60 mL/min   Anion gap 16 (H) 5 - 15    Comment: Performed at [facility], [address] [place], [place] [address]  CBC     Status: Abnormal   Collection Time: 08/23/20  2:32 PM  Result Value Ref Range   WBC 10.0 4.0 - 10.5 K/uL   RBC 3.62 (L) 3.87 - 5.11 MIL/uL   Hemoglobin 11.4 (L) 12.0 - 15.0 g/dL   HCT 57.3 (L) 36 - 46 %   MCV  93.9 80.0 - 100.0 fL   MCH 31.5 26.0 - 34.0 pg   MCHC 33.5 30.0 - 36.0 g/dL   RDW 22.0 25.4 - 27.0 %   Platelets 177 150 - 400 K/uL   nRBC 0.0 0.0 - 0.2 %    Comment: Performed at [facility], [address] [place], [place] [address]  SARS Coronavirus 2 by RT PCR (hospital order, performed in [facility] hospital lab) Nasopharyngeal Nasopharyngeal Swab     Status: None   Collection Time: 08/23/20  7:02 PM   Specimen: Nasopharyngeal Swab  Result Value Ref Range   SARS Coronavirus 2 NEGATIVE NEGATIVE    Comment: (NOTE) SARS-CoV-2 target nucleic acids are NOT DETECTED.  The SARS-CoV-2 RNA is generally detectable in upper and lower respiratory specimens during the acute phase of infection. The lowest concentration of SARS-CoV-2 viral copies this assay can detect is 250 copies / mL. A negative result does not preclude SARS-CoV-2 infection and should not be used as the sole basis for treatment or other patient management decisions.  A negative result may occur with improper specimen collection / handling, submission of specimen other than nasopharyngeal swab, presence of viral mutation(s) within the areas targeted by this assay, and inadequate number of viral copies (<250 copies / mL). A negative result must be combined with clinical observations, patient history, and epidemiological information.  Fact Sheet for Patients:   BoilerBrush.com.cy  Fact Sheet for Healthcare Providers: https://pope.com/  This test is not yet approved or  cleared by the [place] FDA and has been authorized for detection and/or diagnosis of SARS-CoV-2 by FDA under an Emergency Use Authorization (EUA).  This EUA will remain in effect (meaning this test can be used) for the duration of the COVID-19 declaration under Section 564(b)(1) of the Act, 21 U.S.C. section 360bbb-3(b)(1), unless the authorization is terminated or revoked  sooner.  Performed at [facility], [address] [place], [place] [address]   Urinalysis, Routine w reflex microscopic Urine, Clean Catch     Status: Abnormal   Collection Time: 08/24/20  1:40 AM  Result Value Ref Range   Color, Urine AMBER (A) YELLOW    Comment: BIOCHEMICALS MAY BE AFFECTED BY COLOR   APPearance HAZY (A) CLEAR   Specific Gravity, Urine 1.018 1.005 - 1.030   pH 5.0 5.0 - 8.0  Glucose, UA NEGATIVE NEGATIVE mg/dL   Hgb urine dipstick NEGATIVE NEGATIVE   Bilirubin Urine NEGATIVE NEGATIVE   Ketones, ur 5 (A) NEGATIVE mg/dL   Protein, ur 161 (A) NEGATIVE mg/dL   Nitrite NEGATIVE NEGATIVE   Leukocytes,Ua NEGATIVE NEGATIVE   RBC / HPF 0-5 0 - 5 RBC/hpf   WBC, UA 6-10 0 - 5 WBC/hpf   Bacteria, UA RARE (A) NONE SEEN   Squamous Epithelial / LPF 0-5 0 - 5   Mucus PRESENT    Hyaline Casts, UA PRESENT     Comment: Performed at [facility], [address], [place], [place] [address]  Sodium, urine, random     Status: None   Collection Time: 08/24/20  1:40 AM  Result Value Ref Range   Sodium, Ur 42 mmol/L    Comment: Performed at [facility], [address], [place], [place] [address]  Creatinine, urine, random     Status: None   Collection Time: 08/24/20  1:40 AM  Result Value Ref Range   Creatinine, Urine 256.72 mg/dL    Comment: Performed at [facility], [address] [place], [place] [address]  CBC     Status: Abnormal   Collection Time: 08/24/20  4:53 AM  Result Value Ref Range   WBC 4.6 4.0 - 10.5 K/uL   RBC 2.88 (L) 3.87 - 5.11 MIL/uL   Hemoglobin 9.0 (L) 12.0 - 15.0 g/dL   HCT 78.2 (L) 36 - 46 %   MCV 96.2 80.0 - 100.0 fL   MCH 31.3 26.0 - 34.0 pg   MCHC 32.5 30.0 - 36.0 g/dL   RDW 95.6 21.3 - 08.6 %   Platelets 133 (L) 150 - 400 K/uL   nRBC 0.0 0.0 - 0.2 %    Comment: Performed at [facility], [address], [place], [place] [address]  Basic metabolic  panel     Status: Abnormal   Collection Time: 08/24/20  4:53 AM  Result Value Ref Range   Sodium 141 135 - 145 mmol/L   Potassium 4.0 3.5 - 5.1 mmol/L   Chloride 104 98 - 111 mmol/L   CO2 23 22 - 32 mmol/L   Glucose, Bld 58 (L) 70 - 99 mg/dL    Comment: Glucose reference range applies only to samples taken after fasting for at least 8 hours.   BUN 34 (H) 8 - 23 mg/dL   Creatinine, Ser 9.62 (H) 0.44 - 1.00 mg/dL   Calcium 8.0 (L) 8.9 - 10.3 mg/dL   GFR calc non Af Amer 18 (L) >60 mL/min   GFR calc Af Amer 21 (L) >60 mL/min   Anion gap 14 5 - 15    Comment: Performed at [facility], [address], [place], [place] [address]  Gastrointestinal Panel by PCR , Stool     Status: None   Collection Time: 08/24/20  9:37 AM   Specimen: Stool  Result Value Ref Range   Campylobacter species NOT DETECTED NOT DETECTED   Plesimonas shigelloides NOT DETECTED NOT DETECTED   Salmonella species NOT DETECTED NOT DETECTED   Yersinia enterocolitica NOT DETECTED NOT DETECTED   Vibrio species NOT DETECTED NOT DETECTED   Vibrio cholerae NOT DETECTED NOT DETECTED   Enteroaggregative E coli (EAEC) NOT DETECTED NOT DETECTED   Enteropathogenic E coli (EPEC) NOT DETECTED NOT DETECTED   Enterotoxigenic E coli (ETEC) NOT DETECTED NOT DETECTED   Shiga like toxin producing E coli (STEC)  NOT DETECTED NOT DETECTED   Shigella/Enteroinvasive E coli (EIEC) NOT DETECTED NOT DETECTED   Cryptosporidium NOT DETECTED NOT DETECTED   Cyclospora cayetanensis NOT DETECTED NOT DETECTED   Entamoeba histolytica NOT DETECTED NOT DETECTED   Giardia lamblia NOT DETECTED NOT DETECTED   Adenovirus F40/41 NOT DETECTED NOT DETECTED   Astrovirus NOT DETECTED NOT DETECTED   Norovirus GI/GII NOT DETECTED NOT DETECTED   Rotavirus A NOT DETECTED NOT DETECTED   Sapovirus (I, II, IV, and V) NOT DETECTED NOT DETECTED    Comment: Performed at [facility], [address] [place], [place] [address]  C Difficile  Quick Screen w PCR reflex     Status: None   Collection Time: 08/24/20  9:37 AM   Specimen: Stool  Result Value Ref Range   C Diff antigen NEGATIVE NEGATIVE   C Diff toxin NEGATIVE NEGATIVE   C Diff interpretation No C. difficile detected.     Comment: Performed at [facility], [address] [place], [place] [address]  CBG monitoring, ED (now and then every hour for 3 hours)     Status: None   Collection Time: 08/24/20  9:39 AM  Result Value Ref Range   Glucose-Capillary 76 70 - 99 mg/dL    Comment: Glucose reference range applies only to samples taken after fasting for at least 8 hours.  CBG monitoring, ED (now and then every hour for 3 hours)     Status: None   Collection Time: 08/24/20 12:08 PM  Result Value Ref Range   Glucose-Capillary 86 70 - 99 mg/dL    Comment: Glucose reference range applies only to samples taken after fasting for at least 8 hours.   Comment 1 Notify RN    Comment 2 Document in Chart   CBG monitoring, ED (now and then every hour for 3 hours)     Status: Abnormal   Collection Time: 08/24/20  1:07 PM  Result Value Ref Range   Glucose-Capillary 108 (H) 70 - 99 mg/dL    Comment: Glucose reference range applies only to samples taken after fasting for at least 8 hours.  CBG monitoring, ED     Status: None   Collection Time: 08/24/20  2:47 PM  Result Value Ref Range   Glucose-Capillary 98 70 - 99 mg/dL    Comment: Glucose reference range applies only to samples taken after fasting for at least 8 hours.   Comment 1 Notify RN    Comment 2 Document in Chart    CT ABDOMEN PELVIS WO CONTRAST  Result Date: 08/23/2020 CLINICAL DATA:  Nausea vomiting diarrhea EXAM: CT ABDOMEN AND PELVIS WITHOUT CONTRAST TECHNIQUE: Multidetector CT imaging of the abdomen and pelvis was performed following the standard protocol without IV contrast. COMPARISON:  None. FINDINGS: Lower chest: The visualized heart size within normal limits. No pericardial fluid/thickening.  There is a small hiatal hernia present The visualized portions of the lungs are clear. Hepatobiliary: Although limited due to the lack of intravenous contrast, normal in appearance without gross focal abnormality. The patient is status post cholecystectomy. No biliary ductal dilation. Pancreas: Mild fatty atrophy of the pancreas is seen. No surrounding inflammatory changes. Spleen: Normal in size. Although limited due to the lack of intravenous contrast, normal in appearance. Adrenals/Urinary Tract: Both adrenal glands appear normal. The kidneys and collecting system appear normal without evidence of urinary tract calculus or hydronephrosis. Bladder is unremarkable. Stomach/Bowel: The patient has had a prior gastric bypass. The small bowel is unremarkable.  Scattered colonic diverticula are noted without diverticulitis. Vascular/Lymphatic: There are no enlarged abdominal or pelvic lymph nodes. Scattered aortic atherosclerotic calcifications are seen without aneurysmal dilatation. Reproductive: The prostate is unremarkable. Other: A fat containing anterior umbilical hernia seen. Musculoskeletal: No acute or significant osseous findings. IMPRESSION: Diverticulosis without diverticulitis. Aortic Atherosclerosis (ICD10-I70.0). Electronically Signed   By: Jonna Clark M.D.   On: 08/23/2020 19:32   US RENAL  Result Date: 08/24/2020 CLINICAL DATA:  Acute kidney injury. EXAM: RENAL / URINARY TRACT ULTRASOUND COMPLETE COMPARISON:  CT 08/23/2020.  Ultrasound 11/02/2018. FINDINGS: Right Kidney: Renal measurements: 8.6 x 4.5 x 5.1 cm = volume: 104 mL. Cortical thinning. Echogenicity within normal limits. No mass or hydronephrosis visualized. Left Kidney: Renal measurements: 8.8 x 4.7 x 4.6 cm = volume: 100 mL. Cortical thinning. Echogenicity within normal limits. No mass or hydronephrosis visualized. Bladder: No urine noted in the bladder. Other: None. IMPRESSION: 1. Bilateral cortical thinning again noted. Normal renal  echogenicity. 2.  No hydronephrosis.  No urine noted within the bladder. Electronically Signed   By: Maisie Fus  Register   On: 08/24/2020 07:46    Pending Labs Unresulted Labs (From admission, onward)          Start     Ordered   08/25/20 0500  Lipase, blood  Tomorrow morning,   R        08/24/20 1540   08/25/20 0500  Magnesium  Tomorrow morning,   R        08/24/20 1540   08/25/20 0500  TSH  Tomorrow morning,   R        08/24/20 1540   08/23/20 1840  Urea nitrogen, urine  Once,   STAT        08/23/20 1839          Vitals/Pain Today's Vitals   08/24/20 1248 08/24/20 1251 08/24/20 1400 08/24/20 1600  BP: (!) 151/55 (!) 168/68 (!) 147/62 (!) 159/68  Pulse: 72 66 (!) 59 60  Resp:   17 13  Temp:    97.9 F (36.6 C)  TempSrc:    Oral  SpO2:   95% 95%  PainSc:    0-No pain    Isolation Precautions Enteric precautions (UV disinfection)  Medications Medications  sodium chloride flush (NS) 0.9 % injection 3 mL (3 mLs Intravenous Given 08/24/20 0941)  acetaminophen (TYLENOL) tablet 650 mg (has no administration in time range)    Or  acetaminophen (TYLENOL) suppository 650 mg (has no administration in time range)  ondansetron (ZOFRAN) tablet 4 mg ( Oral See Alternative 08/24/20 1604)    Or  ondansetron (ZOFRAN) injection 4 mg (4 mg Intravenous Given 08/24/20 1604)  lactated ringers 1,000 mL with potassium chloride 40 mEq infusion ( Intravenous Stopped 08/24/20 1204)  promethazine (PHENERGAN) injection 12.5 mg (has no administration in time range)  apixaban (ELIQUIS) tablet 5 mg (5 mg Oral Given 08/24/20 0940)  levothyroxine (SYNTHROID) tablet 150 mcg (150 mcg Oral Given 08/24/20 0604)  rosuvastatin (CRESTOR) tablet 40 mg (has no administration in time range)  sodium chloride flush (NS) 0.9 % injection 3 mL (3 mLs Intravenous Given 08/24/20 0942)  sodium chloride flush (NS) 0.9 % injection 3 mL (has no administration in time range)  0.9 %  sodium chloride infusion (has no administration in  time range)  dextrose 5 % in lactated ringers infusion ( Intravenous New Bag/Given 08/24/20 1247)  sodium chloride 0.9 % bolus 1,000 mL (0 mLs Intravenous Stopped 08/23/20 1902)  lactated ringers bolus  1,000 mL (0 mLs Intravenous Stopped 08/23/20 2105)    Mobility walks

## 2020-08-24 NOTE — Progress Notes (Addendum)
PROGRESS NOTE    Lauren Henry  HQI:[id number] DOB: [birth year] DOA: 08/23/2020 PCP: Gaspar Garbe, MD    Chief Complaint  Patient presents with  . Diarrhea    Brief Narrative:  Lauren Henry is a 79 y.o. female with medical history significant for PAF on Eliquis, CHB s/p PPM, CAD, CKD stage IIIb, HTN, HLD, and hypothyroidism who presents to the ED for recurrent nausea, vomiting, and diarrhea.  Patient was recently admitted 08/20/2020-08/22/2020 for acute on chronic kidney injury secondary to dehydration from about 1.5 weeks of nausea, vomiting, and diarrhea.  She was treated supportively with aggressive IV fluid hydration.  Creatinine trending down from 3.22 >> 2.24 on day of discharge (baseline 1.5-1.7).  She was tolerating a diet and had no further diarrhea at time of discharge.  She says she did well all day yesterday (9/1) at home.  She had a couple bites of lasagna for dinner.  This morning when she woke up she had recurrence of frequent nausea, vomiting, and watery diarrhea.  She tried to hydrate with Gatorade but was unable to able to drink half a bottle before symptoms flareup.  She has some associated lightheadedness without syncope or fall.  She came back to the ED for further evaluation.  She says while in the waiting room she spent nearly 2 hours in the bathroom due to ongoing diarrhea.  She otherwise denies any subjective fevers, chills, diaphoresis, chest pain, palpitations, dyspnea, abdominal pain, dysuria, or obvious bleeding including hematochezia or melena.  She lives with her husband who has not had any similar symptoms.  Subjective:  She drank some ginger ale this morning it made her stomach feel queasy Report feeling weak Denies pain No diarrhea or vomiting today  Today is her birthday , I feel sorry that she has to be in the hospital on her birthday  Assessment & Plan:   Principal Problem:   Nausea vomiting and diarrhea Active Problems:   Essential  hypertension   Hyperlipidemia   Hypothyroidism   PAF (paroxysmal atrial fibrillation) (HCC)   CAD in native artery   Acute renal failure superimposed on stage 3b chronic kidney disease (HCC)  Nausea/vomiting/diarrhea: -Recent admit for same and managed supportively with transient improvement -CT abdomen pelvis without IV contrast no acute findings -C. difficile and GI PCR panel negative -On further review of her home medication , it appears that she is on losartan for hypertension .olmesartan can cause  Enteropathy mimic celiac disease including diarrhea, weight loss, nausea, vomiting which can start months to years after taking olmesartan  -,plan to discontinue olmesartan, GI symptoms should  resolved after 1 week after stopping olmesartan -She is clearly dehydrated, not able to tolerate diet, continue hydration, as needed antiemetics  Hypoglycemia -A.m. blood glucose 58 -Likely due to poor oral intake -Start D5 with LR -On hypoglycemia protocol  AKI on CKD stage IIIb/anemia of chronic disease: -The last creatinine level in [facility] record was obtained from January 2020 which was 1.3  -I am not sure if her CKD has since progressed or this is acute changes ,so I contacted PCP's office and found out her cr baseline is around 1.6-1.7, last BMP on August 03, 2020 creatinine level was 1.7 per PCP. -Renal ultrasound " 1. Bilateral cortical thinning again noted. Normal renal echogenicity. 2.  No hydronephrosis.  No urine noted within the bladder" -UA has rare bacteria, but no leukocyte negative nitrite, she has no urinary symptom, less likely UTI - will continue hydration,  she is clearly dehydrated, DC olmesartan, hold spironolactone, avoid hypotension -Repeat BMP in the morning  PAF on chronic anticoagulation, complete heart block status post pacemaker placement -Currently paced rhythm --Coreg held on admission due to dehydration and borderline low BP (93/53) -Blood pressure improving  with hydration, will restart Coreg at a lower dose, she appears to be on long-acting Coreg once daily at home -Continue Eliquis  Hypertension Home med list showed that she is on Coreg CR 40 mg daily, clonidine 0.2 mg q. Nightly, hydralazine 3 times daily, spironolactone 25 mg daily, olmesartan 40 mg daily -She presented with hypotension, all her home bp medication held on admission -Blood pressure start to improve with hydration, restart BP meds gradually with low-dose Coreg for now  Hyperlipidemia Continue Crestor  Hypothyroidism Check TSH Continue Synthroid  DVT prophylaxis:  apixaban (ELIQUIS) tablet 5 mg   Code Status: Full Family Communication: Patient Disposition:   Status is: Observation  Dispo: The patient is from: Home              Anticipated d/c is to: Home              Anticipated d/c date is: 24 to 48 hours, pending GI symptom improvement, creatinine improvement, diet advancement                Consultants:   None  Procedures:   None  Antimicrobials:   None     Objective: Vitals:   08/24/20 1245 08/24/20 1248 08/24/20 1251 08/24/20 1400  BP: (!) 159/71 (!) 151/55 (!) 168/68 (!) 147/62  Pulse: 60 72 66 (!) 59  Resp:    17  Temp:      TempSrc:      SpO2:    95%    Intake/Output Summary (Last 24 hours) at 08/24/2020 1549 Last data filed at 08/23/2020 2105 Gross per 24 hour  Intake 2000 ml  Output --  Net 2000 ml   There were no vitals filed for this visit.  Examination:  General exam: Frail , appear weak , but calm, NAD, AAOx3 Respiratory system: Clear to auscultation. Respiratory effort normal. Cardiovascular system: Paced rhythm , no JVD, no murmur, No pedal edema. Gastrointestinal system: Abdomen is nondistended, soft and nontender. No organomegaly or masses felt. Normal bowel sounds heard. Central nervous system: Alert and oriented. No focal neurological deficits. Extremities: Symmetric 5 x 5 power. Skin: No rashes, lesions or  ulcers Psychiatry: Judgement and insight appear normal. Mood & affect appropriate.     Data Reviewed: I have personally reviewed following labs and imaging studies  CBC: Recent Labs  Lab 08/20/20 1654 08/21/20 0324 08/22/20 0452 08/23/20 1432 08/24/20 0453  WBC 8.1 5.8 5.6 10.0 4.6  NEUTROABS  --   --  3.4  --   --   HGB 11.1* 9.3* 9.3* 11.4* 9.0*  HCT 33.9* 27.6* 27.2* 34.0* 27.7*  MCV 95.0 93.9 91.9 93.9 96.2  PLT 176 143* 144* 177 133*    Basic Metabolic Panel: Recent Labs  Lab 08/20/20 1654 08/21/20 0324 08/22/20 0452 08/23/20 1432 08/24/20 0453  NA 137 138 142 143 141  K 4.6 5.0 3.8 3.7 4.0  CL 110 113* 104 99 104  CO2 13* 15* 27 28 23   GLUCOSE 111* 83 82 112* 58*  BUN 45* 45* 33* 32* 34*  CREATININE 3.22* 2.97* 2.24* 2.52* 2.45*  CALCIUM 9.6 8.9 8.6* 8.8* 8.0*  MG  --   --  1.4*  --   --  GFR: Estimated Creatinine Clearance: 20 mL/min (A) (by C-G formula based on SCr of 2.45 mg/dL (H)).  Liver Function Tests: Recent Labs  Lab 08/20/20 1654 08/21/20 0324 08/23/20 1432  AST 17 18 32  ALT 18 17 25   ALKPHOS 58 47 57  BILITOT 0.7 0.4 1.2  PROT 7.3 6.0* 6.7  ALBUMIN 4.0 3.3* 4.1    CBG: Recent Labs  Lab 08/24/20 0939 08/24/20 1208 08/24/20 1307 08/24/20 1447  GLUCAP 76 86 108* 98     Recent Results (from the past 240 hour(s))  SARS Coronavirus 2 by RT PCR (hospital order, performed in [facility] hospital lab) Nasopharyngeal Nasopharyngeal Swab     Status: None   Collection Time: 08/20/20 11:12 PM   Specimen: Nasopharyngeal Swab  Result Value Ref Range Status   SARS Coronavirus 2 NEGATIVE NEGATIVE Final    Comment: (NOTE) SARS-CoV-2 target nucleic acids are NOT DETECTED.  The SARS-CoV-2 RNA is generally detectable in upper and lower respiratory specimens during the acute phase of infection. The lowest concentration of SARS-CoV-2 viral copies this assay can detect is 250 copies / mL. A negative result does not preclude SARS-CoV-2  infection and should not be used as the sole basis for treatment or other patient management decisions.  A negative result may occur with improper specimen collection / handling, submission of specimen other than nasopharyngeal swab, presence of viral mutation(s) within the areas targeted by this assay, and inadequate number of viral copies (<250 copies / mL). A negative result must be combined with clinical observations, patient history, and epidemiological information.  Fact Sheet for Patients:   08/22/20  Fact Sheet for Healthcare Providers: BoilerBrush.com.cy  This test is not yet approved or  cleared by the https://pope.com/ FDA and has been authorized for detection and/or diagnosis of SARS-CoV-2 by FDA under an Emergency Use Authorization (EUA).  This EUA will remain in effect (meaning this test can be used) for the duration of the COVID-19 declaration under Section 564(b)(1) of the Act, 21 U.S.C. section 360bbb-3(b)(1), unless the authorization is terminated or revoked sooner.  Performed at [facility], [address] [place], [place]   SARS Coronavirus 2 by RT PCR (hospital order, performed in [facility] hospital lab) Nasopharyngeal Nasopharyngeal Swab     Status: None   Collection Time: 08/23/20  7:02 PM   Specimen: Nasopharyngeal Swab  Result Value Ref Range Status   SARS Coronavirus 2 NEGATIVE NEGATIVE Final    Comment: (NOTE) SARS-CoV-2 target nucleic acids are NOT DETECTED.  The SARS-CoV-2 RNA is generally detectable in upper and lower respiratory specimens during the acute phase of infection. The lowest concentration of SARS-CoV-2 viral copies this assay can detect is 250 copies / mL. A negative result does not preclude SARS-CoV-2 infection and should not be used as the sole basis for treatment or other patient management decisions.  A negative result may occur with improper  specimen collection / handling, submission of specimen other than nasopharyngeal swab, presence of viral mutation(s) within the areas targeted by this assay, and inadequate number of viral copies (<250 copies / mL). A negative result must be combined with clinical observations, patient history, and epidemiological information.  Fact Sheet for Patients:   10/23/20  Fact Sheet for Healthcare Providers: BoilerBrush.com.cy  This test is not yet approved or  cleared by the https://pope.com/ FDA and has been authorized for detection and/or diagnosis of SARS-CoV-2 by FDA under an Emergency Use Authorization (EUA).  This EUA will  remain in effect (meaning this test can be used) for the duration of the COVID-19 declaration under Section 564(b)(1) of the Act, 21 U.S.C. section 360bbb-3(b)(1), unless the authorization is terminated or revoked sooner.  Performed at [facility], [address], [place], [place] [address]   Gastrointestinal Panel by PCR , Stool     Status: None   Collection Time: 08/24/20  9:37 AM   Specimen: Stool  Result Value Ref Range Status   Campylobacter species NOT DETECTED NOT DETECTED Final   Plesimonas shigelloides NOT DETECTED NOT DETECTED Final   Salmonella species NOT DETECTED NOT DETECTED Final   Yersinia enterocolitica NOT DETECTED NOT DETECTED Final   Vibrio species NOT DETECTED NOT DETECTED Final   Vibrio cholerae NOT DETECTED NOT DETECTED Final   Enteroaggregative E coli (EAEC) NOT DETECTED NOT DETECTED Final   Enteropathogenic E coli (EPEC) NOT DETECTED NOT DETECTED Final   Enterotoxigenic E coli (ETEC) NOT DETECTED NOT DETECTED Final   Shiga like toxin producing E coli (STEC) NOT DETECTED NOT DETECTED Final   Shigella/Enteroinvasive E coli (EIEC) NOT DETECTED NOT DETECTED Final   Cryptosporidium NOT DETECTED NOT DETECTED Final   Cyclospora cayetanensis NOT DETECTED NOT DETECTED Final    Entamoeba histolytica NOT DETECTED NOT DETECTED Final   Giardia lamblia NOT DETECTED NOT DETECTED Final   Adenovirus F40/41 NOT DETECTED NOT DETECTED Final   Astrovirus NOT DETECTED NOT DETECTED Final   Norovirus GI/GII NOT DETECTED NOT DETECTED Final   Rotavirus A NOT DETECTED NOT DETECTED Final   Sapovirus (I, II, IV, and V) NOT DETECTED NOT DETECTED Final    Comment: Performed at [facility], [address] [place], [place] [address]  C Difficile Quick Screen w PCR reflex     Status: None   Collection Time: 08/24/20  9:37 AM   Specimen: Stool  Result Value Ref Range Status   C Diff antigen NEGATIVE NEGATIVE Final   C Diff toxin NEGATIVE NEGATIVE Final   C Diff interpretation No C. difficile detected.  Final    Comment: Performed at [facility], [address] [place], [place] [address]         Radiology Studies: CT ABDOMEN PELVIS WO CONTRAST  Result Date: 08/23/2020 CLINICAL DATA:  Nausea vomiting diarrhea EXAM: CT ABDOMEN AND PELVIS WITHOUT CONTRAST TECHNIQUE: Multidetector CT imaging of the abdomen and pelvis was performed following the standard protocol without IV contrast. COMPARISON:  None. FINDINGS: Lower chest: The visualized heart size within normal limits. No pericardial fluid/thickening. There is a small hiatal hernia present The visualized portions of the lungs are clear. Hepatobiliary: Although limited due to the lack of intravenous contrast, normal in appearance without gross focal abnormality. The patient is status post cholecystectomy. No biliary ductal dilation. Pancreas: Mild fatty atrophy of the pancreas is seen. No surrounding inflammatory changes. Spleen: Normal in size. Although limited due to the lack of intravenous contrast, normal in appearance. Adrenals/Urinary Tract: Both adrenal glands appear normal. The kidneys and collecting system appear normal without evidence of urinary tract calculus or hydronephrosis. Bladder is  unremarkable. Stomach/Bowel: The patient has had a prior gastric bypass. The small bowel is unremarkable. Scattered colonic diverticula are noted without diverticulitis. Vascular/Lymphatic: There are no enlarged abdominal or pelvic lymph nodes. Scattered aortic atherosclerotic calcifications are seen without aneurysmal dilatation. Reproductive: The prostate is unremarkable. Other: A fat containing anterior umbilical hernia seen. Musculoskeletal: No acute or significant osseous findings. IMPRESSION: Diverticulosis without diverticulitis. Aortic Atherosclerosis (ICD10-I70.0). Electronically Signed  By: Jonna Clark M.D.   On: 08/23/2020 19:32   US RENAL  Result Date: 08/24/2020 CLINICAL DATA:  Acute kidney injury. EXAM: RENAL / URINARY TRACT ULTRASOUND COMPLETE COMPARISON:  CT 08/23/2020.  Ultrasound 11/02/2018. FINDINGS: Right Kidney: Renal measurements: 8.6 x 4.5 x 5.1 cm = volume: 104 mL. Cortical thinning. Echogenicity within normal limits. No mass or hydronephrosis visualized. Left Kidney: Renal measurements: 8.8 x 4.7 x 4.6 cm = volume: 100 mL. Cortical thinning. Echogenicity within normal limits. No mass or hydronephrosis visualized. Bladder: No urine noted in the bladder. Other: None. IMPRESSION: 1. Bilateral cortical thinning again noted. Normal renal echogenicity. 2.  No hydronephrosis.  No urine noted within the bladder. Electronically Signed   By: Maisie Fus  Register   On: 08/24/2020 07:46        Scheduled Meds: . apixaban  5 mg Oral BID  . levothyroxine  150 mcg Oral QAC breakfast  . rosuvastatin  40 mg Oral QHS  . sodium chloride flush  3 mL Intravenous Q12H  . sodium chloride flush  3 mL Intravenous Q12H   Continuous Infusions: . sodium chloride    . dextrose 5% lactated ringers 75 mL/hr at 08/24/20 1247     LOS: 0 days   Time spent: 35 mins Greater than 50% of this time was spent in counseling, explanation of diagnosis, planning of further management, and coordination of  care.  I have personally reviewed and interpreted on  08/24/2020 daily labs, tele strips, imagings as discussed above under date review session and assessment and plans.  I reviewed all nursing notes, pharmacy notes, consultant notes,  vitals, pertinent old records  I have discussed plan of care as described above with RN , patient on 08/24/2020  Voice Recognition /Dragon dictation system was used to create this note, attempts have been made to correct errors. Please contact the author with questions and/or clarifications.   Albertine Grates, MD PhD FACP [facility]  Available via Epic secure chat 7am-7pm for nonurgent issues Please page for urgent issues To page the attending provider between 7A-7P or the covering provider during after hours 7P-7A, please log into the web site [email or web address] and access using universal [place] password for that web site. If you do not have the password, please call the hospital operator.    08/24/2020, 3:49 PM

## 2020-08-24 NOTE — Progress Notes (Signed)
Called to get report on patient coming to room 1421. Was placed on hold. Will wait for a call back.

## 2020-08-25 DIAGNOSIS — R112 Nausea with vomiting, unspecified: Secondary | ICD-10-CM | POA: Diagnosis not present

## 2020-08-25 DIAGNOSIS — I480 Paroxysmal atrial fibrillation: Secondary | ICD-10-CM | POA: Diagnosis not present

## 2020-08-25 DIAGNOSIS — N179 Acute kidney failure, unspecified: Secondary | ICD-10-CM | POA: Diagnosis not present

## 2020-08-25 LAB — CBC WITH DIFFERENTIAL/PLATELET
Abs Immature Granulocytes: 0.02 10*3/uL (ref 0.00–0.07)
Basophils Absolute: 0 10*3/uL (ref 0.0–0.1)
Basophils Relative: 0 %
Eosinophils Absolute: 0.1 10*3/uL (ref 0.0–0.5)
Eosinophils Relative: 2 %
HCT: 28 % — ABNORMAL LOW (ref 36.0–46.0)
Hemoglobin: 9.3 g/dL — ABNORMAL LOW (ref 12.0–15.0)
Immature Granulocytes: 0 %
Lymphocytes Relative: 11 %
Lymphs Abs: 0.9 10*3/uL (ref 0.7–4.0)
MCH: 31.5 pg (ref 26.0–34.0)
MCHC: 33.2 g/dL (ref 30.0–36.0)
MCV: 94.9 fL (ref 80.0–100.0)
Monocytes Absolute: 0.5 10*3/uL (ref 0.1–1.0)
Monocytes Relative: 6 %
Neutro Abs: 6.4 10*3/uL (ref 1.7–7.7)
Neutrophils Relative %: 81 %
Platelets: 142 10*3/uL — ABNORMAL LOW (ref 150–400)
RBC: 2.95 MIL/uL — ABNORMAL LOW (ref 3.87–5.11)
RDW: 12.8 % (ref 11.5–15.5)
WBC: 7.9 10*3/uL (ref 4.0–10.5)
nRBC: 0 % (ref 0.0–0.2)

## 2020-08-25 LAB — BASIC METABOLIC PANEL
Anion gap: 8 (ref 5–15)
BUN: 22 mg/dL (ref 8–23)
CO2: 28 mmol/L (ref 22–32)
Calcium: 8.3 mg/dL — ABNORMAL LOW (ref 8.9–10.3)
Chloride: 106 mmol/L (ref 98–111)
Creatinine, Ser: 1.94 mg/dL — ABNORMAL HIGH (ref 0.44–1.00)
GFR calc Af Amer: 28 mL/min — ABNORMAL LOW (ref >60)
GFR calc non Af Amer: 24 mL/min — ABNORMAL LOW (ref >60)
Glucose, Bld: 112 mg/dL — ABNORMAL HIGH (ref 70–99)
Potassium: 3.8 mmol/L (ref 3.5–5.1)
Sodium: 142 mmol/L (ref 135–145)

## 2020-08-25 LAB — TSH: TSH: 4.159 u[IU]/mL (ref 0.350–4.500)

## 2020-08-25 LAB — MAGNESIUM: Magnesium: 1.2 mg/dL — ABNORMAL LOW (ref 1.7–2.4)

## 2020-08-25 LAB — LIPASE, BLOOD: Lipase: 46 U/L (ref 11–51)

## 2020-08-25 LAB — UREA NITROGEN, URINE: Urea Nitrogen, Ur: 611 mg/dL

## 2020-08-25 MED ORDER — CARVEDILOL 3.125 MG PO TABS
3.1250 mg | ORAL_TABLET | Freq: Two times a day (BID) | ORAL | Status: DC
  Administered 2020-08-25 – 2020-08-26 (×3): 3.125 mg via ORAL
  Filled 2020-08-25 (×3): qty 1

## 2020-08-25 MED ORDER — MAGNESIUM SULFATE 2 GM/50ML IV SOLN
2.0000 g | Freq: Once | INTRAVENOUS | Status: AC
  Administered 2020-08-25: 2 g via INTRAVENOUS
  Filled 2020-08-25: qty 50

## 2020-08-25 NOTE — Evaluation (Signed)
Physical Therapy Evaluation Patient Details Name: Lauren Henry MRN: [id number] DOB: 1941/07/21 Today's Date: 08/25/2020   History of Present Illness  79 y.o. female with medical history significant for PAF on Eliquis, CHB s/p PPM, CAD, CKD stage IIIb, HTN, HLD, and hypothyroidism who presents to the ED for recurrent nausea, vomiting, and diarrhea.  Clinical Impression  Pt admitted with above diagnosis.  Pt independent at her baseline, reports her husband was admitted to hospital in [place] last night d/t retinal detachment.  Pt amb hallway distance today with IV pole or HHA. Anticipate pt will progress well in acute setting and do not feel she will need post acute f/u.   Pt currently with functional limitations due to the deficits listed below (see PT Problem List). Pt will benefit from skilled PT to increase their independence and safety with mobility to allow discharge to the venue listed below.       Follow Up Recommendations No PT follow up    Equipment Recommendations  None recommended by PT    Recommendations for Other Services       Precautions / Restrictions Precautions Precautions: Fall Restrictions Weight Bearing Restrictions: No      Mobility  Bed Mobility               General bed mobility comments: NT--pt in recliner on arrival  Transfers Overall transfer level: Needs assistance Equipment used: Rolling walker (2 wheeled) Transfers: Sit to/from Stand Sit to Stand: Min guard         General transfer comment: for safety  Ambulation/Gait Ambulation/Gait assistance: Min guard;Min assist Gait Distance (Feet): 200 Feet Assistive device: None;IV Pole Gait Pattern/deviations: Step-through pattern;Decreased stride length;Drifts right/left;Narrow base of support Gait velocity: decr   General Gait Details: mildly unsteady gait without overt LOB, stability improved with distance. reliant on IV pole or attempting  to furniture walk in smaller space of  room/bathroom  Stairs            Wheelchair Mobility    Modified Rankin (Stroke Patients Only)       Balance Overall balance assessment: Needs assistance Sitting-balance support: No upper extremity supported;Feet supported Sitting balance-Leahy Scale: Good       Standing balance-Leahy Scale: Fair Standing balance comment: reliant on at least unilateral UE support for dynamic tasks                             Pertinent Vitals/Pain Pain Assessment: No/denies pain    Home Living Family/patient expects to be discharged to:: Private residence Living Arrangements: Spouse/significant other Available Help at Discharge: Family;Available PRN/intermittently Type of Home: House Home Access: Stairs to enter Entrance Stairs-Rails: Left Entrance Stairs-Number of Steps: 3 Home Layout: One level Home Equipment: Walker - 2 wheels;Cane - single point      Prior Function Level of Independence: Independent         Comments: pt reports she and her husband are quite independent however he was admitted to hospital in [place] d/t retinal detachment     Hand Dominance        Extremity/Trunk Assessment   Upper Extremity Assessment Upper Extremity Assessment: Overall WFL for tasks assessed    Lower Extremity Assessment Lower Extremity Assessment: Overall WFL for tasks assessed       Communication   Communication: No difficulties  Cognition Arousal/Alertness: Awake/alert Behavior During Therapy: WFL for tasks assessed/performed Overall Cognitive Status: Within Functional Limits for tasks assessed  General Comments      Exercises     Assessment/Plan    PT Assessment Patient needs continued PT services  PT Problem List Decreased mobility;Decreased balance;Decreased activity tolerance       PT Treatment Interventions DME instruction;Therapeutic exercise;Gait training;Functional mobility  training;Therapeutic activities;Patient/family education    PT Goals (Current goals can be found in the Care Plan section)  Acute Rehab PT Goals Patient Stated Goal: feel better, home soon PT Goal Formulation: With patient Time For Goal Achievement: 09/08/20 Potential to Achieve Goals: Good    Frequency Min 3X/week   Barriers to discharge        Co-evaluation               AM-PAC PT "6 Clicks" Mobility  Outcome Measure Help needed turning from your back to your side while in a flat bed without using bedrails?: A Little Help needed moving from lying on your back to sitting on the side of a flat bed without using bedrails?: A Little Help needed moving to and from a bed to a chair (including a wheelchair)?: A Little Help needed standing up from a chair using your arms (e.g., wheelchair or bedside chair)?: A Little Help needed to walk in hospital room?: A Little Help needed climbing 3-5 steps with a railing? : A Little 6 Click Score: 18    End of Session Equipment Utilized During Treatment: Gait belt Activity Tolerance: Patient tolerated treatment well Patient left: in chair;with call bell/phone within reach (in chair, no alarm on arrival) Nurse Communication: Mobility status PT Visit Diagnosis: Difficulty in walking, not elsewhere classified (R26.2)    Time: 5361-4431 PT Time Calculation (min) (ACUTE ONLY): 17 min   Charges:   PT Evaluation $PT Eval Low Complexity: 1 Low          Shaylah Mcghie, PT  Acute Rehab Dept ([facility]) [phone] Pager [phone]  08/25/2020   [facility] 08/25/2020, 10:21 AM

## 2020-08-25 NOTE — Progress Notes (Addendum)
PROGRESS NOTE    Lauren Henry  JIR:[id number] DOB: [birth year] DOA: 08/23/2020 PCP: Gaspar Garbe, MD    Chief Complaint  Patient presents with  . Diarrhea    Brief Narrative:  Lauren Henry is a 79 y.o. female with medical history significant for PAF on Eliquis, CHB s/p PPM, CAD, CKD stage IIIb, HTN, HLD, and hypothyroidism who presents to the ED for recurrent nausea, vomiting, and diarrhea.  Patient was recently admitted 08/20/2020-08/22/2020 for acute on chronic kidney injury secondary to dehydration from about 1.5 weeks of nausea, vomiting, and diarrhea.  She was treated supportively with aggressive IV fluid hydration.  Creatinine trending down from 3.22 >> 2.24 on day of discharge (baseline 1.5-1.7).  She was tolerating a diet and had no further diarrhea at time of discharge.  She says she did well all day yesterday (9/1) at home.  She had a couple bites of lasagna for dinner.  This morning when she woke up she had recurrence of frequent nausea, vomiting, and watery diarrhea.  She tried to hydrate with Gatorade but was unable to able to drink half a bottle before symptoms flareup.  She has some associated lightheadedness without syncope or fall.  She came back to the ED for further evaluation.  She says while in the waiting room she spent nearly 2 hours in the bathroom due to ongoing diarrhea.  She otherwise denies any subjective fevers, chills, diaphoresis, chest pain, palpitations, dyspnea, abdominal pain, dysuria, or obvious bleeding including hematochezia or melena.  She lives with her husband who has not had any similar symptoms.  Subjective:  She report multiple diarrhea x8 times yesterday afternoon,Report feeling weak, she has no appetite Denies pain, no fever Feeling queasy, no vomiting  Case Assessment & Plan:   Principal Problem:   Nausea vomiting and diarrhea Active Problems:   Essential hypertension   Hyperlipidemia   Hypothyroidism   PAF (paroxysmal atrial  fibrillation) (HCC)   CAD in native artery   Acute renal failure superimposed on stage 3b chronic kidney disease (HCC)  Nausea/vomiting/diarrhea: -Recent admit for same and managed supportively with transient improvement -CT abdomen pelvis without IV contrast no acute findings -C. difficile and GI PCR panel negative -On further review of her home medication , it appears that she is on olmesartan which can cause  Enteropathy mimic celiac disease including diarrhea, weight loss, nausea, vomiting which can start months to years after taking olmesartan  - discontinue olmesartan, GI symptoms should  resolved 1 week after stopping olmesartan -reports diarrhea 8 times yesterday pm, no diarrhea yet today, oral intake remain poor, continue hydration, as needed antiemetics -check celiac disease serology  Hypoglycemia -A.m. blood glucose 58 on presentation, likely due to poor oral intake -resolved , continue  D5 with LR since oral intake remain poor.  Hypomagnesemia Mag 1.2 Replace mag in Recheck in am   AKI on CKD stage IIIb/anemia of chronic disease: -The last creatinine level in Lauren Henry record was obtained from January 2020 which was 1.3  - last BMP on August 03, 2020 creatinine level was 1.7 per PCP. On 8/22 cr was 2.2 per pcp -Renal ultrasound " 1. Bilateral cortical thinning again noted. Normal renal echogenicity. 2.  No hydronephrosis.  No urine noted within the bladder" -UA has rare bacteria, but no leukocyte negative nitrite, she has no urinary symptom, less likely UTI - will continue hydration, she is clearly dehydrated, DC olmesartan, hold spironolactone, avoid hypotension -cr 2.5 on presentation, cr today is 1.94,  oral intake remain poor ,will continue hydration for another 24hrs  PAF on chronic anticoagulation, complete heart block status post pacemaker placement -Currently paced rhythm --Coreg held on admission due to dehydration and borderline low BP (93/53) -Blood pressure  improving with hydration, will restart Coreg at a lower dose, she appears to be on long-acting Coreg once daily at home -Continue Eliquis  Hypertension Home med list showed that she is on Coreg CR 40 mg daily, clonidine 0.2 mg q. Nightly, hydralazine 3 times daily, spironolactone 25 mg daily, olmesartan 40 mg daily -She presented with hypotension, all her home bp medication held on admission -Blood pressure start to improve with hydration, restart BP meds gradually with low-dose Coreg for now, continue to hold other bp meds  Hyperlipidemia Continue Crestor  Hypothyroidism TSH 4.1 Continue Synthroid  DVT prophylaxis:  apixaban (ELIQUIS) tablet 5 mg   Code Status: Full Family Communication: Patient Disposition:   Status is: Observation  Dispo: The patient is from: Home              Anticipated d/c is to: Home              Anticipated d/c date is: 24 to 48 hours, pending GI symptom improvement, creatinine improvement, diet advancement                Consultants:   None  Procedures:   None  Antimicrobials:   None     Objective: Vitals:   08/24/20 2115 08/25/20 0243 08/25/20 0443 08/25/20 1227  BP: (!) 145/79 135/62 132/60 140/64  Pulse: 60 62 66 63  Resp: 18 18 18 18   Temp: 98.3 F (36.8 C) 98.7 F (37.1 C) 98.2 F (36.8 C) 98.7 F (37.1 C)  TempSrc: Oral Oral Oral Oral  SpO2: 96% 94% 93%   Weight:      Height:        Intake/Output Summary (Last 24 hours) at 08/25/2020 1236 Last data filed at 08/25/2020 1020 Gross per 24 hour  Intake 311.28 ml  Output 1600 ml  Net -1288.72 ml   Filed Weights   08/24/20 1711  Weight: 84.8 kg    Examination:  General exam: Frail , appear weak , but calm, NAD, AAOx3 Respiratory system: Clear to auscultation. Respiratory effort normal. Cardiovascular system: Paced rhythm , no JVD, no murmur, No pedal edema. Gastrointestinal system: Abdomen is nondistended, soft and nontender. No organomegaly or masses felt. Normal  bowel sounds heard. Central nervous system: Alert and oriented. No focal neurological deficits. Extremities: Symmetric 5 x 5 power. Skin: No rashes, lesions or ulcers Psychiatry: Judgement and insight appear normal. Mood & affect appropriate.     Data Reviewed: I have personally reviewed following labs and imaging studies  CBC: Recent Labs  Lab 08/21/20 0324 08/22/20 0452 08/23/20 1432 08/24/20 0453 08/25/20 0601  WBC 5.8 5.6 10.0 4.6 7.9  NEUTROABS  --  3.4  --   --  6.4  HGB 9.3* 9.3* 11.4* 9.0* 9.3*  HCT 27.6* 27.2* 34.0* 27.7* 28.0*  MCV 93.9 91.9 93.9 96.2 94.9  PLT 143* 144* 177 133* 142*    Basic Metabolic Panel: Recent Labs  Lab 08/21/20 0324 08/22/20 0452 08/23/20 1432 08/24/20 0453 08/25/20 0601  NA 138 142 143 141 142  K 5.0 3.8 3.7 4.0 3.8  CL 113* 104 99 104 106  CO2 15* 27 28 23 28   GLUCOSE 83 82 112* 58* 112*  BUN 45* 33* 32* 34* 22  CREATININE 2.97* 2.24* 2.52* 2.45*  1.94*  CALCIUM 8.9 8.6* 8.8* 8.0* 8.3*  MG  --  1.4*  --   --  1.2*    GFR: Estimated Creatinine Clearance: 25.3 mL/min (A) (by C-G formula based on SCr of 1.94 mg/dL (H)).  Liver Function Tests: Recent Labs  Lab 08/20/20 1654 08/21/20 0324 08/23/20 1432  AST 17 18 32  ALT 18 17 25   ALKPHOS 58 47 57  BILITOT 0.7 0.4 1.2  PROT 7.3 6.0* 6.7  ALBUMIN 4.0 3.3* 4.1    CBG: Recent Labs  Lab 08/24/20 0939 08/24/20 1208 08/24/20 1307 08/24/20 1447  GLUCAP 76 86 108* 98     Recent Results (from the past 240 hour(s))  SARS Coronavirus 2 by RT PCR (hospital order, performed in [facility] hospital lab) Nasopharyngeal Nasopharyngeal Swab     Status: None   Collection Time: 08/20/20 11:12 PM   Specimen: Nasopharyngeal Swab  Result Value Ref Range Status   SARS Coronavirus 2 NEGATIVE NEGATIVE Final    Comment: (NOTE) SARS-CoV-2 target nucleic acids are NOT DETECTED.  The SARS-CoV-2 RNA is generally detectable in upper and lower respiratory specimens during the acute  phase of infection. The lowest concentration of SARS-CoV-2 viral copies this assay can detect is 250 copies / mL. A negative result does not preclude SARS-CoV-2 infection and should not be used as the sole basis for treatment or other patient management decisions.  A negative result may occur with improper specimen collection / handling, submission of specimen other than nasopharyngeal swab, presence of viral mutation(s) within the areas targeted by this assay, and inadequate number of viral copies (<250 copies / mL). A negative result must be combined with clinical observations, patient history, and epidemiological information.  Fact Sheet for Patients:   08/22/20  Fact Sheet for Healthcare Providers: BoilerBrush.com.cy  This test is not yet approved or  cleared by the https://pope.com/ FDA and has been authorized for detection and/or diagnosis of SARS-CoV-2 by FDA under an Emergency Use Authorization (EUA).  This EUA will remain in effect (meaning this test can be used) for the duration of the COVID-19 declaration under Section 564(b)(1) of the Act, 21 U.S.C. section 360bbb-3(b)(1), unless the authorization is terminated or revoked sooner.  Performed at [facility], [address], [place], [place]   SARS Coronavirus 2 by RT PCR (hospital order, performed in [facility] hospital lab) Nasopharyngeal Nasopharyngeal Swab     Status: None   Collection Time: 08/23/20  7:02 PM   Specimen: Nasopharyngeal Swab  Result Value Ref Range Status   SARS Coronavirus 2 NEGATIVE NEGATIVE Final    Comment: (NOTE) SARS-CoV-2 target nucleic acids are NOT DETECTED.  The SARS-CoV-2 RNA is generally detectable in upper and lower respiratory specimens during the acute phase of infection. The lowest concentration of SARS-CoV-2 viral copies this assay can detect is 250 copies / mL. A negative result does not preclude  SARS-CoV-2 infection and should not be used as the sole basis for treatment or other patient management decisions.  A negative result may occur with improper specimen collection / handling, submission of specimen other than nasopharyngeal swab, presence of viral mutation(s) within the areas targeted by this assay, and inadequate number of viral copies (<250 copies / mL). A negative result must be combined with clinical observations, patient history, and epidemiological information.  Fact Sheet for Patients:   10/23/20  Fact Sheet for Healthcare Providers: BoilerBrush.com.cy  This test is not yet approved or  cleared by the https://pope.com/  States FDA and has been authorized for detection and/or diagnosis of SARS-CoV-2 by FDA under an Emergency Use Authorization (EUA).  This EUA will remain in effect (meaning this test can be used) for the duration of the COVID-19 declaration under Section 564(b)(1) of the Act, 21 U.S.C. section 360bbb-3(b)(1), unless the authorization is terminated or revoked sooner.  Performed at [facility], [address] [place], [place] [address]   Gastrointestinal Panel by PCR , Stool     Status: None   Collection Time: 08/24/20  9:37 AM   Specimen: Stool  Result Value Ref Range Status   Campylobacter species NOT DETECTED NOT DETECTED Final   Plesimonas shigelloides NOT DETECTED NOT DETECTED Final   Salmonella species NOT DETECTED NOT DETECTED Final   Yersinia enterocolitica NOT DETECTED NOT DETECTED Final   Vibrio species NOT DETECTED NOT DETECTED Final   Vibrio cholerae NOT DETECTED NOT DETECTED Final   Enteroaggregative E coli (EAEC) NOT DETECTED NOT DETECTED Final   Enteropathogenic E coli (EPEC) NOT DETECTED NOT DETECTED Final   Enterotoxigenic E coli (ETEC) NOT DETECTED NOT DETECTED Final   Shiga like toxin producing E coli (STEC) NOT DETECTED NOT DETECTED Final    Shigella/Enteroinvasive E coli (EIEC) NOT DETECTED NOT DETECTED Final   Cryptosporidium NOT DETECTED NOT DETECTED Final   Cyclospora cayetanensis NOT DETECTED NOT DETECTED Final   Entamoeba histolytica NOT DETECTED NOT DETECTED Final   Giardia lamblia NOT DETECTED NOT DETECTED Final   Adenovirus F40/41 NOT DETECTED NOT DETECTED Final   Astrovirus NOT DETECTED NOT DETECTED Final   Norovirus GI/GII NOT DETECTED NOT DETECTED Final   Rotavirus A NOT DETECTED NOT DETECTED Final   Sapovirus (I, II, IV, and V) NOT DETECTED NOT DETECTED Final    Comment: Performed at [facility], [address] [place], [place] [address]  C Difficile Quick Screen w PCR reflex     Status: None   Collection Time: 08/24/20  9:37 AM   Specimen: Stool  Result Value Ref Range Status   C Diff antigen NEGATIVE NEGATIVE Final   C Diff toxin NEGATIVE NEGATIVE Final   C Diff interpretation No C. difficile detected.  Final    Comment: Performed at [facility], [address] [place], [place] [address]         Radiology Studies: CT ABDOMEN PELVIS WO CONTRAST  Result Date: 08/23/2020 CLINICAL DATA:  Nausea vomiting diarrhea EXAM: CT ABDOMEN AND PELVIS WITHOUT CONTRAST TECHNIQUE: Multidetector CT imaging of the abdomen and pelvis was performed following the standard protocol without IV contrast. COMPARISON:  None. FINDINGS: Lower chest: The visualized heart size within normal limits. No pericardial fluid/thickening. There is a small hiatal hernia present The visualized portions of the lungs are clear. Hepatobiliary: Although limited due to the lack of intravenous contrast, normal in appearance without gross focal abnormality. The patient is status post cholecystectomy. No biliary ductal dilation. Pancreas: Mild fatty atrophy of the pancreas is seen. No surrounding inflammatory changes. Spleen: Normal in size. Although limited due to the lack of intravenous contrast, normal in appearance.  Adrenals/Urinary Tract: Both adrenal glands appear normal. The kidneys and collecting system appear normal without evidence of urinary tract calculus or hydronephrosis. Bladder is unremarkable. Stomach/Bowel: The patient has had a prior gastric bypass. The small bowel is unremarkable. Scattered colonic diverticula are noted without diverticulitis. Vascular/Lymphatic: There are no enlarged abdominal or pelvic lymph nodes. Scattered aortic atherosclerotic calcifications are seen without aneurysmal dilatation. Reproductive: The prostate is unremarkable.  Other: A fat containing anterior umbilical hernia seen. Musculoskeletal: No acute or significant osseous findings. IMPRESSION: Diverticulosis without diverticulitis. Aortic Atherosclerosis (ICD10-I70.0). Electronically Signed   By: Jonna Clark M.D.   On: 08/23/2020 19:32   US RENAL  Result Date: 08/24/2020 CLINICAL DATA:  Acute kidney injury. EXAM: RENAL / URINARY TRACT ULTRASOUND COMPLETE COMPARISON:  CT 08/23/2020.  Ultrasound 11/02/2018. FINDINGS: Right Kidney: Renal measurements: 8.6 x 4.5 x 5.1 cm = volume: 104 mL. Cortical thinning. Echogenicity within normal limits. No mass or hydronephrosis visualized. Left Kidney: Renal measurements: 8.8 x 4.7 x 4.6 cm = volume: 100 mL. Cortical thinning. Echogenicity within normal limits. No mass or hydronephrosis visualized. Bladder: No urine noted in the bladder. Other: None. IMPRESSION: 1. Bilateral cortical thinning again noted. Normal renal echogenicity. 2.  No hydronephrosis.  No urine noted within the bladder. Electronically Signed   By: Maisie Fus  Register   On: 08/24/2020 07:46        Scheduled Meds: . apixaban  5 mg Oral BID  . levothyroxine  150 mcg Oral QAC breakfast  . rosuvastatin  40 mg Oral QHS  . sodium chloride flush  3 mL Intravenous Q12H  . sodium chloride flush  3 mL Intravenous Q12H   Continuous Infusions: . sodium chloride    . dextrose 5% lactated ringers 75 mL/hr at 08/25/20 0515      LOS: 0 days   Time spent: 35 mins Greater than 50% of this time was spent in counseling, explanation of diagnosis, planning of further management, and coordination of care.  I have personally reviewed and interpreted on  08/25/2020 daily labs, tele strips, imagings as discussed above under date review session and assessment and plans.  I reviewed all nursing notes, pharmacy notes,   vitals, pertinent old records  I have discussed plan of care as described above with RN , patient on 08/25/2020  Voice Recognition /Dragon dictation system was used to create this note, attempts have been made to correct errors. Please contact the author with questions and/or clarifications.   Albertine Grates, MD PhD FACP [facility]  Available via Epic secure chat 7am-7pm for nonurgent issues Please page for urgent issues To page the attending provider between 7A-7P or the covering provider during after hours 7P-7A, please log into the web site [email or web address] and access using universal  password for that web site. If you do not have the password, please call the hospital operator.    08/25/2020, 12:36 PM

## 2020-08-26 DIAGNOSIS — R112 Nausea with vomiting, unspecified: Secondary | ICD-10-CM | POA: Diagnosis not present

## 2020-08-26 DIAGNOSIS — R197 Diarrhea, unspecified: Secondary | ICD-10-CM | POA: Diagnosis not present

## 2020-08-26 LAB — BASIC METABOLIC PANEL
Anion gap: 9 (ref 5–15)
BUN: 16 mg/dL (ref 8–23)
CO2: 27 mmol/L (ref 22–32)
Calcium: 8.6 mg/dL — ABNORMAL LOW (ref 8.9–10.3)
Chloride: 104 mmol/L (ref 98–111)
Creatinine, Ser: 1.69 mg/dL — ABNORMAL HIGH (ref 0.44–1.00)
GFR calc Af Amer: 33 mL/min — ABNORMAL LOW (ref >60)
GFR calc non Af Amer: 28 mL/min — ABNORMAL LOW (ref >60)
Glucose, Bld: 114 mg/dL — ABNORMAL HIGH (ref 70–99)
Potassium: 3.8 mmol/L (ref 3.5–5.1)
Sodium: 140 mmol/L (ref 135–145)

## 2020-08-26 LAB — CBC
HCT: 28.2 % — ABNORMAL LOW (ref 36.0–46.0)
Hemoglobin: 9.2 g/dL — ABNORMAL LOW (ref 12.0–15.0)
MCH: 31.3 pg (ref 26.0–34.0)
MCHC: 32.6 g/dL (ref 30.0–36.0)
MCV: 95.9 fL (ref 80.0–100.0)
Platelets: 132 10*3/uL — ABNORMAL LOW (ref 150–400)
RBC: 2.94 MIL/uL — ABNORMAL LOW (ref 3.87–5.11)
RDW: 12.6 % (ref 11.5–15.5)
WBC: 5.9 10*3/uL (ref 4.0–10.5)
nRBC: 0 % (ref 0.0–0.2)

## 2020-08-26 LAB — MAGNESIUM: Magnesium: 1.7 mg/dL (ref 1.7–2.4)

## 2020-08-26 MED ORDER — CARVEDILOL 6.25 MG PO TABS
6.2500 mg | ORAL_TABLET | Freq: Two times a day (BID) | ORAL | Status: DC
  Administered 2020-08-27 – 2020-08-28 (×3): 6.25 mg via ORAL
  Filled 2020-08-26 (×5): qty 1

## 2020-08-26 MED ORDER — LOPERAMIDE HCL 2 MG PO CAPS: 2.0000 mg | ORAL_CAPSULE | Freq: Four times a day (QID) | ORAL | Status: DC | PRN

## 2020-08-26 MED ORDER — MAGNESIUM SULFATE IN D5W 1-5 GM/100ML-% IV SOLN
1.0000 g | Freq: Once | INTRAVENOUS | Status: AC
  Administered 2020-08-26: 1 g via INTRAVENOUS
  Filled 2020-08-26: qty 100

## 2020-08-26 NOTE — Progress Notes (Addendum)
PROGRESS NOTE    Lauren HitchSandra D Henry  ZOX:[id number] DOB: 03/25/1941 DOA: 08/23/2020 PCP: Gaspar Garbeisovec, Richard W, MD    Chief Complaint  Patient presents with  . Diarrhea    Brief Narrative:  Lauren Henry is a 79 y.o. female with medical history significant for PAF on Eliquis, CHB s/p PPM, CAD, CKD stage IIIb, HTN, HLD, and hypothyroidism who presents to the ED for recurrent nausea, vomiting, and diarrhea.  Patient was recently admitted 08/20/2020-08/22/2020 for acute on chronic kidney injury secondary to dehydration from about 1.5 weeks of nausea, vomiting, and diarrhea.  She was treated supportively with aggressive IV fluid hydration.  Creatinine trending down from 3.22 >> 2.24 on day of discharge (baseline 1.5-1.7).  She was tolerating a diet and had no further diarrhea at time of discharge.  She says she did well all day yesterday (9/1) at home.  She had a couple bites of lasagna for dinner.  This morning when she woke up she had recurrence of frequent nausea, vomiting, and watery diarrhea.  She tried to hydrate with Gatorade but was unable to able to drink half a bottle before symptoms flareup.  She has some associated lightheadedness without syncope or fall.  She came back to the ED for further evaluation.  She says while in the waiting room she spent nearly 2 hours in the bathroom due to ongoing diarrhea.  She otherwise denies any subjective fevers, chills, diaphoresis, chest pain, palpitations, dyspnea, abdominal pain, dysuria, or obvious bleeding including hematochezia or melena.  She lives with her husband who has not had any similar symptoms.  Subjective:  Diarrhea has significantly slowed down, went from 8 times a day on day 1 to  1 diarrhea the last 24hrs. Nausea has resolved as well, oral intake improved, hypoglycemia resolved She Denies pain, no fever She is feeling better, however she is reluctant to go home because she does not want to have "horrible diarrhea" again when she gets  back home like she did after being discharged on 9/1. she wants to stay for one more night to make sure diarrhea does not reoccur .   Case Assessment & Plan:   Principal Problem:   Nausea vomiting and diarrhea Active Problems:   Essential hypertension   Hyperlipidemia   Hypothyroidism   PAF (paroxysmal atrial fibrillation) (HCC)   CAD in native artery   Acute renal failure superimposed on stage 3b chronic kidney disease (HCC)  Nausea/vomiting/diarrhea: -Recent admit for same  From 8/30 to 9/1 and managed supportively with transient improvement -CT abdomen pelvis without IV contrast no acute findings -C. difficile and GI PCR panel negative -On further review of her home medication , it appears that she is on olmesartan which can cause  Enteropathy mimic celiac disease including diarrhea, weight loss, nausea, vomiting which can start months to years after taking olmesartan  -  GI symptoms should  resolved 1 week after stopping olmesartan if that is the sole cause -She presents with severe dehydration, AKI, hypoglycemia due to poor oral intake, she received LR then D5 LR continuously for more than 60hrs,  -she improved significantly , will d/c ivf,  -celiac disease serology in process  -she is advised to follow up with her pcp and  GI Dr Lottie MusselMadoff   9/5 7pm addendum: RN reports patient had one soft stool this pm.  Hypoglycemia -A.m. blood glucose 58 on presentation, likely due to poor oral intake -she received  D5 with LR for more than 48hrs, d/c ivf now  started to eat more  Hypomagnesemia Mag 1.2 on presentation, mag 1.7 after iv mag x1 on 9/4, remain low, give another iv mag today Recheck in am, goal mag>2 Tele unremarkable, will discontinue tele   Azotemia/AKI on CKD stage IIIb/anemia of chronic disease: - creatinine level was  1.3  in January 2020  - creatinine level was 1.7  On 8/13  per PCP. On 8/22 cr was 2.2  per pcp -Renal ultrasound " 1. Bilateral cortical thinning again  noted. Normal renal echogenicity. 2.  No hydronephrosis.  No urine noted within the bladder" -UA has rare bacteria, but no leukocyte negative nitrite, she has no urinary symptom, less likely UTI --cr 2.5 on presentation, cr today is 1.69 ,  Bun on presentation was 34, bun today is 16, oral intake improved , d/c ivf,  -aki likely prerenal due to dehydration , she was borderline hypotensive on admission as well -DC olmesartan, continue hold spironolactone, avoid hypotension  Thrombocytopenia Mild appear chronic and intermittent F/u with pcp  PAF on chronic anticoagulation, complete heart block status post pacemaker placement -Currently paced rhythm --Coreg held on admission due to dehydration and borderline low BP (93/53) -Blood pressure improving with hydration, restarted  Coreg at a lower dose, she appears to be on long-acting Coreg once daily at home -Continue Eliquis  Hypertension Home med list showed that she is on Coreg CR 40 mg daily, clonidine 0.2 mg q. Nightly, hydralazine 3 times daily, spironolactone 25 mg daily, olmesartan 40 mg daily -She presented with hypotension, all her home bp medication held on admission -Blood pressure start to improve,  restart BP meds gradually with low-dose Coreg for now, continue to hold other bp meds  Hyperlipidemia Continue Crestor  Hypothyroidism TSH 4.1 Continue Synthroid  DVT prophylaxis:  apixaban (ELIQUIS) tablet 5 mg   Code Status: Full Family Communication: Patient Disposition:   Status is: Observation  Dispo: The patient is from: Home              Anticipated d/c is to: Home tomorrow if no more diarrhea                             Consultants:   None  Procedures:   None  Antimicrobials:   None     Objective: Vitals:   08/25/20 0443 08/25/20 1227 08/25/20 2000 08/26/20 0623  BP: 132/60 140/64 (!) 148/69 (!) 149/70  Pulse: 66 63 60 60  Resp: 18 18 19 18   Temp: 98.2 F (36.8 C) 98.7 F (37.1 C) 97.7 F (36.5  C) 98.1 F (36.7 C)  TempSrc: Oral Oral Oral Oral  SpO2: 93%  97% 94%  Weight:      Height:        Intake/Output Summary (Last 24 hours) at 08/26/2020 0938 Last data filed at 08/26/2020 0600 Gross per 24 hour  Intake 2668.41 ml  Output 1000 ml  Net 1668.41 ml   Filed Weights   08/24/20 1711  Weight: 84.8 kg    Examination:  General exam: Frail , appear weak , but calm, NAD, AAOx3 Respiratory system: Clear to auscultation. Respiratory effort normal. Cardiovascular system: Paced rhythm , no JVD, no murmur, No pedal edema. Gastrointestinal system: Abdomen is nondistended, soft and nontender. No organomegaly or masses felt. Normal bowel sounds heard. Central nervous system: Alert and oriented. No focal neurological deficits. Extremities: Symmetric 5 x 5 power. Skin: No rashes, lesions or ulcers Psychiatry: Judgement and insight appear  normal. Mood & affect appropriate.     Data Reviewed: I have personally reviewed following labs and imaging studies  CBC: Recent Labs  Lab 08/22/20 0452 08/23/20 1432 08/24/20 0453 08/25/20 0601 08/26/20 0612  WBC 5.6 10.0 4.6 7.9 5.9  NEUTROABS 3.4  --   --  6.4  --   HGB 9.3* 11.4* 9.0* 9.3* 9.2*  HCT 27.2* 34.0* 27.7* 28.0* 28.2*  MCV 91.9 93.9 96.2 94.9 95.9  PLT 144* 177 133* 142* 132*    Basic Metabolic Panel: Recent Labs  Lab 08/22/20 0452 08/23/20 1432 08/24/20 0453 08/25/20 0601 08/26/20 0612  NA 142 143 141 142 140  K 3.8 3.7 4.0 3.8 3.8  CL 104 99 104 106 104  CO2 GLUCOSE 82 112* 58* 112* 114*  BUN 33* 32* 34* 22 16  CREATININE 2.24* 2.52* 2.45* 1.94* 1.69*  CALCIUM 8.6* 8.8* 8.0* 8.3* 8.6*  MG 1.4*  --   --  1.2* 1.7    GFR: Estimated Creatinine Clearance: 29 mL/min (A) (by C-G formula based on SCr of 1.69 mg/dL (H)).  Liver Function Tests: Recent Labs  Lab 08/20/20 1654 08/21/20 0324 08/23/20 1432  AST 17 18 32  ALT ALKPHOS 58 47 57  BILITOT 0.7 0.4 1.2  PROT 7.3 6.0* 6.7    ALBUMIN 4.0 3.3* 4.1    CBG: Recent Labs  Lab 08/24/20 0939 08/24/20 1208 08/24/20 1307 08/24/20 1447  GLUCAP 76 86 108* 98     Recent Results (from the past 240 hour(s))  SARS Coronavirus 2 by RT PCR (hospital order, performed in [facility] hospital lab) Nasopharyngeal Nasopharyngeal Swab     Status: None   Collection Time: 08/20/20 11:12 PM   Specimen: Nasopharyngeal Swab  Result Value Ref Range Status   SARS Coronavirus 2 NEGATIVE NEGATIVE Final    Comment: (NOTE) SARS-CoV-2 target nucleic acids are NOT DETECTED.  The SARS-CoV-2 RNA is generally detectable in upper and lower respiratory specimens during the acute phase of infection. The lowest concentration of SARS-CoV-2 viral copies this assay can detect is 250 copies / mL. A negative result does not preclude SARS-CoV-2 infection and should not be used as the sole basis for treatment or other patient management decisions.  A negative result may occur with improper specimen collection / handling, submission of specimen other than nasopharyngeal swab, presence of viral mutation(s) within the areas targeted by this assay, and inadequate number of viral copies (<250 copies / mL). A negative result must be combined with clinical observations, patient history, and epidemiological information.  Fact Sheet for Patients:   BoilerBrush.com.cy  Fact Sheet for Healthcare Providers: https://pope.com/  This test is not yet approved or  cleared by the [place] FDA and has been authorized for detection and/or diagnosis of SARS-CoV-2 by FDA under an Emergency Use Authorization (EUA).  This EUA will remain in effect (meaning this test can be used) for the duration of the COVID-19 declaration under Section 564(b)(1) of the Act, 21 U.S.C. section 360bbb-3(b)(1), unless the authorization is terminated or revoked sooner.  Performed at [facility], [address] [place], [place] [address]   SARS Coronavirus 2 by RT PCR (hospital order, performed in [facility] hospital lab) Nasopharyngeal Nasopharyngeal Swab     Status: None   Collection Time: 08/23/20  7:02 PM   Specimen: Nasopharyngeal Swab  Result Value Ref Range Status   SARS Coronavirus 2 NEGATIVE NEGATIVE Final  Comment: (NOTE) SARS-CoV-2 target nucleic acids are NOT DETECTED.  The SARS-CoV-2 RNA is generally detectable in upper and lower respiratory specimens during the acute phase of infection. The lowest concentration of SARS-CoV-2 viral copies this assay can detect is 250 copies / mL. A negative result does not preclude SARS-CoV-2 infection and should not be used as the sole basis for treatment or other patient management decisions.  A negative result may occur with improper specimen collection / handling, submission of specimen other than nasopharyngeal swab, presence of viral mutation(s) within the areas targeted by this assay, and inadequate number of viral copies (<250 copies / mL). A negative result must be combined with clinical observations, patient history, and epidemiological information.  Fact Sheet for Patients:   BoilerBrush.com.cy  Fact Sheet for Healthcare Providers: https://pope.com/  This test is not yet approved or  cleared by the [place] FDA and has been authorized for detection and/or diagnosis of SARS-CoV-2 by FDA under an Emergency Use Authorization (EUA).  This EUA will remain in effect (meaning this test can be used) for the duration of the COVID-19 declaration under Section 564(b)(1) of the Act, 21 U.S.C. section 360bbb-3(b)(1), unless the authorization is terminated or revoked sooner.  Performed at [facility], [address] [place], [place] [address]   Gastrointestinal Panel by PCR , Stool     Status: None   Collection Time: 08/24/20  9:37 AM   Specimen: Stool   Result Value Ref Range Status   Campylobacter species NOT DETECTED NOT DETECTED Final   Plesimonas shigelloides NOT DETECTED NOT DETECTED Final   Salmonella species NOT DETECTED NOT DETECTED Final   Yersinia enterocolitica NOT DETECTED NOT DETECTED Final   Vibrio species NOT DETECTED NOT DETECTED Final   Vibrio cholerae NOT DETECTED NOT DETECTED Final   Enteroaggregative E coli (EAEC) NOT DETECTED NOT DETECTED Final   Enteropathogenic E coli (EPEC) NOT DETECTED NOT DETECTED Final   Enterotoxigenic E coli (ETEC) NOT DETECTED NOT DETECTED Final   Shiga like toxin producing E coli (STEC) NOT DETECTED NOT DETECTED Final   Shigella/Enteroinvasive E coli (EIEC) NOT DETECTED NOT DETECTED Final   Cryptosporidium NOT DETECTED NOT DETECTED Final   Cyclospora cayetanensis NOT DETECTED NOT DETECTED Final   Entamoeba histolytica NOT DETECTED NOT DETECTED Final   Giardia lamblia NOT DETECTED NOT DETECTED Final   Adenovirus F40/41 NOT DETECTED NOT DETECTED Final   Astrovirus NOT DETECTED NOT DETECTED Final   Norovirus GI/GII NOT DETECTED NOT DETECTED Final   Rotavirus A NOT DETECTED NOT DETECTED Final   Sapovirus (I, II, IV, and V) NOT DETECTED NOT DETECTED Final    Comment: Performed at [facility], [address] [place], [place] [address]  C Difficile Quick Screen w PCR reflex     Status: None   Collection Time: 08/24/20  9:37 AM   Specimen: Stool  Result Value Ref Range Status   C Diff antigen NEGATIVE NEGATIVE Final   C Diff toxin NEGATIVE NEGATIVE Final   C Diff interpretation No C. difficile detected.  Final    Comment: Performed at [facility], [address] [place], [place] [address]         Radiology Studies: No results found.      Scheduled Meds: . apixaban  5 mg Oral BID  . carvedilol  3.125 mg Oral BID WC  . levothyroxine  150 mcg Oral QAC breakfast  . rosuvastatin  40 mg Oral QHS  . sodium chloride flush  3 mL Intravenous Q12H  .  sodium chloride flush  3 mL Intravenous Q12H   Continuous Infusions: . sodium chloride    . dextrose 5% lactated ringers 75 mL/hr at 08/26/20 0834  . magnesium sulfate bolus IVPB 1 g (08/26/20 0838)     LOS: 0 days   Time spent: 35 mins Greater than 50% of this time was spent in counseling, explanation of diagnosis, planning of further management, and coordination of care.  I have personally reviewed and interpreted on  08/26/2020 daily labs, tele strips, imagings as discussed above under date review session and assessment and plans.  I reviewed all nursing notes, pharmacy notes,   vitals, pertinent old records  I have discussed plan of care as described above with RN , patient on 08/26/2020  Voice Recognition /Dragon dictation system was used to create this note, attempts have been made to correct errors. Please contact the author with questions and/or clarifications.   Albertine Grates, MD PhD FACP [facility]  Available via Epic secure chat 7am-7pm for nonurgent issues Please page for urgent issues To page the attending provider between 7A-7P or the covering provider during after hours 7P-7A, please log into the web site [email or web address] and access using universal [place] password for that web site. If you do not have the password, please call the hospital operator.    08/26/2020, 9:38 AM

## 2020-08-27 ENCOUNTER — Inpatient Hospital Stay (HOSPITAL_COMMUNITY): Payer: Medicare PPO

## 2020-08-27 DIAGNOSIS — Z888 Allergy status to other drugs, medicaments and biological substances status: Secondary | ICD-10-CM | POA: Diagnosis not present

## 2020-08-27 DIAGNOSIS — Z20822 Contact with and (suspected) exposure to covid-19: Secondary | ICD-10-CM | POA: Diagnosis present

## 2020-08-27 DIAGNOSIS — E7800 Pure hypercholesterolemia, unspecified: Secondary | ICD-10-CM

## 2020-08-27 DIAGNOSIS — I2510 Atherosclerotic heart disease of native coronary artery without angina pectoris: Secondary | ICD-10-CM | POA: Diagnosis present

## 2020-08-27 DIAGNOSIS — D696 Thrombocytopenia, unspecified: Secondary | ICD-10-CM | POA: Diagnosis not present

## 2020-08-27 DIAGNOSIS — K529 Noninfective gastroenteritis and colitis, unspecified: Secondary | ICD-10-CM | POA: Diagnosis present

## 2020-08-27 DIAGNOSIS — Z950 Presence of cardiac pacemaker: Secondary | ICD-10-CM | POA: Diagnosis not present

## 2020-08-27 DIAGNOSIS — Z79890 Hormone replacement therapy (postmenopausal): Secondary | ICD-10-CM | POA: Diagnosis not present

## 2020-08-27 DIAGNOSIS — M1990 Unspecified osteoarthritis, unspecified site: Secondary | ICD-10-CM | POA: Diagnosis present

## 2020-08-27 DIAGNOSIS — I480 Paroxysmal atrial fibrillation: Secondary | ICD-10-CM | POA: Diagnosis present

## 2020-08-27 DIAGNOSIS — I442 Atrioventricular block, complete: Secondary | ICD-10-CM | POA: Diagnosis present

## 2020-08-27 DIAGNOSIS — E039 Hypothyroidism, unspecified: Secondary | ICD-10-CM | POA: Diagnosis present

## 2020-08-27 DIAGNOSIS — I10 Essential (primary) hypertension: Secondary | ICD-10-CM

## 2020-08-27 DIAGNOSIS — I129 Hypertensive chronic kidney disease with stage 1 through stage 4 chronic kidney disease, or unspecified chronic kidney disease: Secondary | ICD-10-CM | POA: Diagnosis present

## 2020-08-27 DIAGNOSIS — N179 Acute kidney failure, unspecified: Secondary | ICD-10-CM | POA: Diagnosis present

## 2020-08-27 DIAGNOSIS — N1832 Chronic kidney disease, stage 3b: Secondary | ICD-10-CM | POA: Diagnosis present

## 2020-08-27 DIAGNOSIS — Z7901 Long term (current) use of anticoagulants: Secondary | ICD-10-CM | POA: Diagnosis not present

## 2020-08-27 DIAGNOSIS — I959 Hypotension, unspecified: Secondary | ICD-10-CM | POA: Diagnosis present

## 2020-08-27 DIAGNOSIS — E860 Dehydration: Secondary | ICD-10-CM | POA: Diagnosis present

## 2020-08-27 DIAGNOSIS — R112 Nausea with vomiting, unspecified: Secondary | ICD-10-CM | POA: Diagnosis present

## 2020-08-27 DIAGNOSIS — Z8249 Family history of ischemic heart disease and other diseases of the circulatory system: Secondary | ICD-10-CM | POA: Diagnosis not present

## 2020-08-27 DIAGNOSIS — E785 Hyperlipidemia, unspecified: Secondary | ICD-10-CM | POA: Diagnosis present

## 2020-08-27 DIAGNOSIS — Z79899 Other long term (current) drug therapy: Secondary | ICD-10-CM | POA: Diagnosis not present

## 2020-08-27 DIAGNOSIS — E162 Hypoglycemia, unspecified: Secondary | ICD-10-CM | POA: Diagnosis present

## 2020-08-27 DIAGNOSIS — D631 Anemia in chronic kidney disease: Secondary | ICD-10-CM | POA: Diagnosis present

## 2020-08-27 LAB — CBC
HCT: 30 % — ABNORMAL LOW (ref 36.0–46.0)
Hemoglobin: 9.8 g/dL — ABNORMAL LOW (ref 12.0–15.0)
MCH: 31.3 pg (ref 26.0–34.0)
MCHC: 32.7 g/dL (ref 30.0–36.0)
MCV: 95.8 fL (ref 80.0–100.0)
Platelets: 146 10*3/uL — ABNORMAL LOW (ref 150–400)
RBC: 3.13 MIL/uL — ABNORMAL LOW (ref 3.87–5.11)
RDW: 12.7 % (ref 11.5–15.5)
WBC: 7.6 10*3/uL (ref 4.0–10.5)
nRBC: 0 % (ref 0.0–0.2)

## 2020-08-27 LAB — TISSUE TRANSGLUTAMINASE, IGA: Tissue Transglutaminase Ab, IgA: <2 U/mL (ref 0–3)

## 2020-08-27 LAB — BASIC METABOLIC PANEL
Anion gap: 11 (ref 5–15)
BUN: 14 mg/dL (ref 8–23)
CO2: 24 mmol/L (ref 22–32)
Calcium: 8.9 mg/dL (ref 8.9–10.3)
Chloride: 104 mmol/L (ref 98–111)
Creatinine, Ser: 1.44 mg/dL — ABNORMAL HIGH (ref 0.44–1.00)
GFR calc Af Amer: 40 mL/min — ABNORMAL LOW (ref >60)
GFR calc non Af Amer: 34 mL/min — ABNORMAL LOW (ref >60)
Glucose, Bld: 96 mg/dL (ref 70–99)
Potassium: 4 mmol/L (ref 3.5–5.1)
Sodium: 139 mmol/L (ref 135–145)

## 2020-08-27 LAB — ENDOMYSIAL IGA ANTIBODY: Endomysial Ab, IgA: NEGATIVE

## 2020-08-27 LAB — TISSUE TRANSGLUTAMINASE, IGG: Tissue Transglut Ab: <2 U/mL (ref 0–5)

## 2020-08-27 LAB — MAGNESIUM: Magnesium: 1.6 mg/dL — ABNORMAL LOW (ref 1.7–2.4)

## 2020-08-27 MED ORDER — PANTOPRAZOLE SODIUM 40 MG IV SOLR
40.0000 mg | Freq: Two times a day (BID) | INTRAVENOUS | Status: DC
  Administered 2020-08-27 – 2020-08-28 (×2): 40 mg via INTRAVENOUS
  Filled 2020-08-27 (×2): qty 40

## 2020-08-27 MED ORDER — DEXTROSE-NACL 5-0.9 % IV SOLN: INTRAVENOUS | Status: DC

## 2020-08-27 NOTE — Progress Notes (Signed)
PROGRESS NOTE    Lauren Henry  SWN:[id number]  DOB: January 31, 1941  PCP: Gaspar Garbe, MD Admit date:08/23/2020 Chief compliant: Recurrent nausea, vomiting and diarrhea 79 y.o.femalewith medical history significant forPAF on Eliquis, CHB s/p PPM, CAD, CKD stage IIIb, HTN, HLD, and hypothyroidism who presents to the ED for recurrent nausea, vomiting, and diarrhea.Patient was recently admitted 08/20/2020-08/22/2020 for acute on chronic kidney injury secondary to dehydration fromabout 1.5 weeks ofnausea, vomiting, and diarrhea. She was treated supportively with aggressive IV fluid hydration. Creatinine trended down from 3.22 >>2.24 on day of discharge.  She was tolerating a diet and had no further diarrhea at time of discharge on 9/1.  She was barely home for 24 hours when her symptoms recurred (watery diarrhea, intractable nausea and vomiting) after eating couple of bites of lasagna for dinner. She tried to hydrate with Gatorade but was unable to able to drink half a bottle before symptoms flareup. She has some associated lightheadedness without syncope or fall. She came back to the ED for further evaluation. ED Course: Afebrile but had several bouts of loose stools while in the waiting room.  CT abdomen/pelvis on 9/2 showed diverticulosis without diverticulitis.  Absent gallbladder. Hospital course: Patient admitted to [facility] for recurrent gastroenteritis symptoms and associated worsening AKI (discharge creatinine 2.2->2.52 on admission while baseline around 1.4-1.5).  She was started on IV hydration and supportive management.  Stool work-up pursued.  Olmesartan held in concern for possible drug-induced enteritis.  Subjective:  Patient covered in blankets when seen in rounds, appeared to be in mild distress due to chills and dry heaving.  Reported 1 loose stool prior to me rounding.  Requested nurse to give Zofran x1 and encourage patient to utilize loperamide as needed if diarrhea recurs.   Breakfast tray at bedside and noted that patient only ate 2 grapes.  She did not want to eat anymore as was afraid of precipitating dyspepsia/vomiting.  She states she is post cholecystectomy.  She had seen GI Dr. Lottie Mussel in 2019 and apparently endoscopic work-up at that time was negative.  Objective: Vitals:   08/26/20 1229 08/26/20 2016 08/27/20 0448 08/27/20 1206  BP: (!) 149/83 (!) 154/81 133/83 133/72  Pulse: 61 62 63 64  Resp: 18 18 18 17   Temp: 98.2 F (36.8 C) 98.2 F (36.8 C) 99.5 F (37.5 C) 99.8 F (37.7 C)  TempSrc: Oral Oral Oral Oral  SpO2: 98% 97% 94% 94%  Weight:      Height:       No intake or output data in the 24 hours ending 08/27/20 1644 Filed Weights   08/24/20 1711  Weight: 84.8 kg    Physical Examination:  General: Moderately built, no acute distress noted Head ENT: Atraumatic normocephalic, PERRLA, neck supple Heart: S1-S2 heard, regular rate and rhythm, no murmurs.  No leg edema noted Lungs: Equal air entry bilaterally, no rhonchi or rales on exam, no accessory muscle use Abdomen: Bowel sounds heard, soft, mild epigastric tenderness without guarding or rebound, nondistended. No organomegaly.  No CVA tenderness Extremities: No pedal edema.  No cyanosis or clubbing. Neurological: Awake alert oriented x3, no focal weakness or numbness, strength and sensations to crude touch intact Skin: No wounds or rashes.   Data Reviewed: I have personally reviewed following labs and imaging studies  CBC: Recent Labs  Lab 08/22/20 0452 08/22/20 0452 08/23/20 1432 08/24/20 0453 08/25/20 0601 08/26/20 0612 08/27/20 0643  WBC 5.6   < > 10.0 4.6 7.9 5.9 7.6  NEUTROABS 3.4  --   --   --  6.4  --   --   HGB 9.3*   < > 11.4* 9.0* 9.3* 9.2* 9.8*  HCT 27.2*   < > 34.0* 27.7* 28.0* 28.2* 30.0*  MCV 91.9   < > 93.9 96.2 94.9 95.9 95.8  PLT 144*   < > 177 133* 142* 132* 146*   < > = values in this interval not displayed.   Basic Metabolic Panel: Recent Labs  Lab  08/22/20 0452 08/22/20 0452 08/23/20 1432 08/24/20 0453 08/25/20 0601 08/26/20 0612 08/27/20 0643  NA 142   < > 143 141 142 140 139  K 3.8   < > 3.7 4.0 3.8 3.8 4.0  CL 104   < > 99 104 106 104 104  CO2 27   < > 28 23 28 27 24   GLUCOSE 82   < > 112* 58* 112* 114* 96  BUN 33*   < > 32* 34* 22 16 14   CREATININE 2.24*   < > 2.52* 2.45* 1.94* 1.69* 1.44*  CALCIUM 8.6*   < > 8.8* 8.0* 8.3* 8.6* 8.9  MG 1.4*  --   --   --  1.2* 1.7 1.6*   < > = values in this interval not displayed.   GFR: Estimated Creatinine Clearance: 34.1 mL/min (A) (by C-G formula based on SCr of 1.44 mg/dL (H)). Liver Function Tests: Recent Labs  Lab 08/20/20 1654 08/21/20 0324 08/23/20 1432  AST 17 18 32  ALT 18 17 25   ALKPHOS 58 47 57  BILITOT 0.7 0.4 1.2  PROT 7.3 6.0* 6.7  ALBUMIN 4.0 3.3* 4.1   Recent Labs  Lab 08/20/20 1654 08/25/20 0601  LIPASE 47 46   No results for input(s): AMMONIA in the last 168 hours. Coagulation Profile: No results for input(s): INR, PROTIME in the last 168 hours. Cardiac Enzymes: No results for input(s): CKTOTAL, CKMB, CKMBINDEX, TROPONINI in the last 168 hours. BNP (last 3 results) No results for input(s): PROBNP in the last 8760 hours. HbA1C: No results for input(s): HGBA1C in the last 72 hours. CBG: Recent Labs  Lab 08/24/20 0939 08/24/20 1208 08/24/20 1307 08/24/20 1447  GLUCAP 76 86 108* 98   Lipid Profile: No results for input(s): CHOL, HDL, LDLCALC, TRIG, CHOLHDL, LDLDIRECT in the last 72 hours. Thyroid Function Tests: Recent Labs    08/25/20 0601  TSH 4.159   Anemia Panel: No results for input(s): VITAMINB12, FOLATE, FERRITIN, TIBC, IRON, RETICCTPCT in the last 72 hours. Sepsis Labs: No results for input(s): PROCALCITON, LATICACIDVEN in the last 168 hours.  Recent Results (from the past 240 hour(s))  SARS Coronavirus 2 by RT PCR (hospital order, performed in [facility] hospital lab) Nasopharyngeal Nasopharyngeal Swab     Status: None    Collection Time: 08/20/20 11:12 PM   Specimen: Nasopharyngeal Swab  Result Value Ref Range Status   SARS Coronavirus 2 NEGATIVE NEGATIVE Final    Comment: (NOTE) SARS-CoV-2 target nucleic acids are NOT DETECTED.  The SARS-CoV-2 RNA is generally detectable in upper and lower respiratory specimens during the acute phase of infection. The lowest concentration of SARS-CoV-2 viral copies this assay can detect is 250 copies / mL. A negative result does not preclude SARS-CoV-2 infection and should not be used as the sole basis for treatment or other patient management decisions.  A negative result may occur with improper specimen collection / handling, submission of specimen other than nasopharyngeal swab, presence of viral mutation(s) within the areas targeted by this assay, and inadequate number of  viral copies (<250 copies / mL). A negative result must be combined with clinical observations, patient history, and epidemiological information.  Fact Sheet for Patients:   BoilerBrush.com.cyhttps://www.fda.gov/media/136312/download  Fact Sheet for Healthcare Providers: https://pope.com/https://www.fda.gov/media/136313/download  This test is not yet approved or  cleared by the [place] FDA and has been authorized for detection and/or diagnosis of SARS-CoV-2 by FDA under an Emergency Use Authorization (EUA).  This EUA will remain in effect (meaning this test can be used) for the duration of the COVID-19 declaration under Section 564(b)(1) of the Act, 21 U.S.C. section 360bbb-3(b)(1), unless the authorization is terminated or revoked sooner.  Performed at [facility], [address] [place], [place] [address]   SARS Coronavirus 2 by RT PCR (hospital order, performed in [facility] hospital lab) Nasopharyngeal Nasopharyngeal Swab     Status: None   Collection Time: 08/23/20  7:02 PM   Specimen: Nasopharyngeal Swab  Result Value Ref Range Status   SARS Coronavirus 2 NEGATIVE NEGATIVE Final     Comment: (NOTE) SARS-CoV-2 target nucleic acids are NOT DETECTED.  The SARS-CoV-2 RNA is generally detectable in upper and lower respiratory specimens during the acute phase of infection. The lowest concentration of SARS-CoV-2 viral copies this assay can detect is 250 copies / mL. A negative result does not preclude SARS-CoV-2 infection and should not be used as the sole basis for treatment or other patient management decisions.  A negative result may occur with improper specimen collection / handling, submission of specimen other than nasopharyngeal swab, presence of viral mutation(s) within the areas targeted by this assay, and inadequate number of viral copies (<250 copies / mL). A negative result must be combined with clinical observations, patient history, and epidemiological information.  Fact Sheet for Patients:   BoilerBrush.com.cyhttps://www.fda.gov/media/136312/download  Fact Sheet for Healthcare Providers: https://pope.com/https://www.fda.gov/media/136313/download  This test is not yet approved or  cleared by the [place] FDA and has been authorized for detection and/or diagnosis of SARS-CoV-2 by FDA under an Emergency Use Authorization (EUA).  This EUA will remain in effect (meaning this test can be used) for the duration of the COVID-19 declaration under Section 564(b)(1) of the Act, 21 U.S.C. section 360bbb-3(b)(1), unless the authorization is terminated or revoked sooner.  Performed at [facility], [address] [place], [place] [address]   Gastrointestinal Panel by PCR , Stool     Status: None   Collection Time: 08/24/20  9:37 AM   Specimen: Stool  Result Value Ref Range Status   Campylobacter species NOT DETECTED NOT DETECTED Final   Plesimonas shigelloides NOT DETECTED NOT DETECTED Final   Salmonella species NOT DETECTED NOT DETECTED Final   Yersinia enterocolitica NOT DETECTED NOT DETECTED Final   Vibrio species NOT DETECTED NOT DETECTED Final   Vibrio cholerae NOT  DETECTED NOT DETECTED Final   Enteroaggregative E coli (EAEC) NOT DETECTED NOT DETECTED Final   Enteropathogenic E coli (EPEC) NOT DETECTED NOT DETECTED Final   Enterotoxigenic E coli (ETEC) NOT DETECTED NOT DETECTED Final   Shiga like toxin producing E coli (STEC) NOT DETECTED NOT DETECTED Final   Shigella/Enteroinvasive E coli (EIEC) NOT DETECTED NOT DETECTED Final   Cryptosporidium NOT DETECTED NOT DETECTED Final   Cyclospora cayetanensis NOT DETECTED NOT DETECTED Final   Entamoeba histolytica NOT DETECTED NOT DETECTED Final   Giardia lamblia NOT DETECTED NOT DETECTED Final   Adenovirus F40/41 NOT DETECTED NOT DETECTED Final   Astrovirus NOT DETECTED NOT DETECTED Final   Norovirus GI/GII NOT  DETECTED NOT DETECTED Final   Rotavirus A NOT DETECTED NOT DETECTED Final   Sapovirus (I, II, IV, and V) NOT DETECTED NOT DETECTED Final    Comment: Performed at [facility], [address] [place], [place] [address]  C Difficile Quick Screen w PCR reflex     Status: None   Collection Time: 08/24/20  9:37 AM   Specimen: Stool  Result Value Ref Range Status   C Diff antigen NEGATIVE NEGATIVE Final   C Diff toxin NEGATIVE NEGATIVE Final   C Diff interpretation No C. difficile detected.  Final    Comment: Performed at [facility], [address] [place], [place] [address]      Radiology Studies: No results found.    Scheduled Meds: . apixaban  5 mg Oral BID  . carvedilol  6.25 mg Oral BID WC  . levothyroxine  150 mcg Oral QAC breakfast  . rosuvastatin  40 mg Oral QHS  . sodium chloride flush  3 mL Intravenous Q12H  . sodium chloride flush  3 mL Intravenous Q12H   Continuous Infusions: . sodium chloride    . dextrose 5 % and 0.9% NaCl        Assessment/Plan:  1.  Recurrent gastroenteritis: Patient transiently improved in recent hospitalization but readmitted within 24 hours with recurrent symptoms and worsening renal function.  Admitted with IV  fluids and slow diet advancement.  Discontinued olmesartan by prior MD for possible medication induced enteropathy.  Celiac disease work-up sent but pending as of now.  She denies any prior history of gluten intolerance.  TSH WNL.  Her oral intake remained poor until 9/4 and improved somewhat yesterday (was able to tolerate at least 50% of her lunch/dinner) but has recurrent symptoms today and was not able to eat any of her breakfast or lunch.  She ordered full liquid diet from the menu and was able to tolerate after Zofran.  She does not feel ready for regular diet.  Since her oral intake remains poor with less than 25% intake, will place her on IV hydration/clear liquid diet, add IV PPI for possible gastritis and follow-up in a.m. Will also obtain abdominal x-ray today. If her condition does not improve and continues to have nausea vomiting or diarrhea, will consult GI on call (she follows Dr. Kinnie Scales at [facility]) I could not pull up prior endoscopic work-up in care everywhere.  2.  Acute kidney injury on CKD stage IIIb: Present on admission.  Patient's baseline creatinine is around 1.4-1.5.  She presented with creatinine 2.5 which has slowly improved with IV fluids support to baseline 1.4.  Given continued poor oral intake and GI losses will resume IV fluid support to avoid recurrent renal function decline.  Will taper fluids to off once oral intake improves.  Recent renal ultrasonogram in August showed bilateral cortical thinning, no hydronephrosis.  Home medications including Aldactone, olmesartan held.  Avoid hypotension.  3.  Hypomagnesemia: Patient had magnesium level critically low at 1.2 on presentation, received IV replacement and improved to 1.7.  Will avoid oral replacement as can exacerbate diarrhea.  Monitor for now.  4.  Paroxysmal atrial fibrillation, chronic heart block s/p pacemaker placement: Resumed oral anticoagulation.  Coreg held on admission due to dehydration/hypotension, now resumed at  lower dose.  5.  Essential hypertension: Continue Coreg, olmesartan discontinued in concern for possible medication induced enteropathy and AKI.  Other medications including hydralazine, clonidine and Aldactone also held in concern for hypotension on presentation and  ongoing poor oral intake.  Check orthostatic blood pressures.  6.  Hyperlipidemia: Continue Crestor  7.  Chronic normocytic anemia: Hemoglobin stable around 9-10.  Patient seen by GI at [facility] previously as discussed above.  No signs of acute GI bleed and remains on chronic anticoagulation.  8.  Hypothyroidism: Continue Synthroid.  TSH within normal limits.  DVT prophylaxis: Eliquis Code Status: Full code Family / Patient Communication: Discussed with patient in detail Disposition Plan:   Status is: Observation  The patient will require care spanning > 2 midnights and should be moved to inpatient because: IV treatments appropriate due to intensity of illness or inability to take PO  Dispo: The patient is from: Home              Anticipated d/c is to: Home              Anticipated d/c date is: 1 -2 days              Patient currently is not medically stable to d/c.           Time spent: 35 mins   >50% time spent in discussions with care team and coordination of care.  Discussed with bedside nurse, discussed with UM team    Alessandra Bevels, MD [facility] Pager in Amion  If 7PM-7AM, please contact night-coverage [email or web address] 08/27/2020, 4:44 PM

## 2020-08-27 NOTE — Progress Notes (Signed)
Physical Therapy Treatment Patient Details Name: Lauren Henry MRN: [id number] DOB: [birth year] Today's Date: 08/27/2020    History of Present Illness 79 y.o. female with medical history significant for PAF on Eliquis, CHB s/p PPM, CAD, CKD stage IIIb, HTN, HLD, and hypothyroidism who presents to the ED for recurrent nausea, vomiting, and diarrhea.    PT Comments    Patient progressing steadily with acute therapy. She advanced gait distance today and decreased UE support with 1HHA and no UE support at times. Pt remains unsteady with no UE support and has tendency to not swing UE's for balance. Manual facilitation provided to improve arm swing with gait today. Pt with poor carryover when cues removed. Anticipate pt will continue to improve independence with mobility. Pt will benefit from skilled PT to increase their independence and safety with mobility; Acute PT will follow to progress as able.    Follow Up Recommendations  No PT follow up     Equipment Recommendations  None recommended by PT    Recommendations for Other Services       Precautions / Restrictions Precautions Precautions: Fall Restrictions Weight Bearing Restrictions: No    Mobility  Bed Mobility Overal bed mobility: Needs Assistance Bed Mobility: Supine to Sit     Supine to sit: Min guard;Supervision     General bed mobility comments: pt slow to mobilize, use of bed rail to raise trunk. no assist required.  Transfers Overall transfer level: Needs assistance Equipment used: None;1 person hand held assist Transfers: Sit to/from [employer] Sit to Stand: Min guard;Supervision Stand pivot transfers: Min guard;Supervision       General transfer comment: gaurd for safety, no assist required.   Ambulation/Gait Ambulation/Gait assistance: Min guard;Min assist Gait Distance (Feet): 180 Feet Assistive device: 1 person hand held assist;None Gait Pattern/deviations: Step-through  pattern;Decreased stride length;Drifts right/left;Narrow base of support (decreased arm swing) Gait velocity: decr   General Gait Details: pt with mild gait deviations and unsteadiness. pt reliant on HHA initially and more unsteady without support and used bed rail/furniture walked in bathroom/room. HHA provided with tactile cues to facilitate arm swing bil while amb in hallway.    Stairs             Wheelchair Mobility    Modified Rankin (Stroke Patients Only)       Balance Overall balance assessment: Needs assistance Sitting-balance support: No upper extremity supported;Feet supported Sitting balance-Leahy Scale: Good     Standing balance support: Single extremity supported;During functional activity Standing balance-Leahy Scale: Fair Standing balance comment: reliant on at least unilateral UE support for dynamic tasks               Cognition Arousal/Alertness: Awake/alert Behavior During Therapy: WFL for tasks assessed/performed Overall Cognitive Status: Within Functional Limits for tasks assessed                     Exercises      General Comments        Pertinent Vitals/Pain Pain Assessment: No/denies pain           PT Goals (current goals can now be found in the care plan section) Acute Rehab PT Goals Patient Stated Goal: feel better, home soon PT Goal Formulation: With patient Time For Goal Achievement: 09/08/20 Potential to Achieve Goals: Good Progress towards PT goals: Progressing toward goals    Frequency    Min 3X/week      PT Plan Current plan remains appropriate  Co-evaluation              AM-PAC PT "6 Clicks" Mobility   Outcome Measure  Help needed turning from your back to your side while in a flat bed without using bedrails?: None Help needed moving from lying on your back to sitting on the side of a flat bed without using bedrails?: A Little Help needed moving to and from a bed to a chair (including a  wheelchair)?: A Little Help needed standing up from a chair using your arms (e.g., wheelchair or bedside chair)?: A Little Help needed to walk in hospital room?: A Little Help needed climbing 3-5 steps with a railing? : A Little 6 Click Score: 19    End of Session Equipment Utilized During Treatment: Gait belt Activity Tolerance: Patient tolerated treatment well Patient left: in chair;with call bell/phone within reach;with chair alarm set Nurse Communication: Mobility status PT Visit Diagnosis: Difficulty in walking, not elsewhere classified (R26.2)     Time: 5277-8242 PT Time Calculation (min) (ACUTE ONLY): 21 min  Charges:  $Gait Training: 8-22 mins                     Wynn Maudlin, DPT Acute Rehabilitation Services  Office [phone] Pager [phone]  08/27/2020 11:36 AM

## 2020-08-27 NOTE — Care Management Obs Status (Signed)
MEDICARE OBSERVATION STATUS NOTIFICATION   Patient Details  Name: Lauren Henry MRN: [id number] Date of Birth: 05-01-1941   Medicare Observation Status Notification Given:  Yes    MahabirOlegario Messier, RN 08/27/2020, 1:01 PM

## 2020-08-28 ENCOUNTER — Telehealth: Payer: Self-pay | Admitting: Cardiovascular Disease

## 2020-08-28 DIAGNOSIS — I2510 Atherosclerotic heart disease of native coronary artery without angina pectoris: Secondary | ICD-10-CM

## 2020-08-28 DIAGNOSIS — K529 Noninfective gastroenteritis and colitis, unspecified: Secondary | ICD-10-CM

## 2020-08-28 LAB — BASIC METABOLIC PANEL
Anion gap: 8 (ref 5–15)
BUN: 11 mg/dL (ref 8–23)
CO2: 24 mmol/L (ref 22–32)
Calcium: 8.6 mg/dL — ABNORMAL LOW (ref 8.9–10.3)
Chloride: 107 mmol/L (ref 98–111)
Creatinine, Ser: 1.56 mg/dL — ABNORMAL HIGH (ref 0.44–1.00)
GFR calc Af Amer: 36 mL/min — ABNORMAL LOW (ref >60)
GFR calc non Af Amer: 31 mL/min — ABNORMAL LOW (ref >60)
Glucose, Bld: 104 mg/dL — ABNORMAL HIGH (ref 70–99)
Potassium: 4.1 mmol/L (ref 3.5–5.1)
Sodium: 139 mmol/L (ref 135–145)

## 2020-08-28 LAB — CORTISOL: Cortisol, Plasma: 19.4 ug/dL

## 2020-08-28 MED ORDER — MUSCLE RUB 10-15 % EX CREA
TOPICAL_CREAM | CUTANEOUS | Status: DC | PRN
  Filled 2020-08-28: qty 85

## 2020-08-28 MED ORDER — LOPERAMIDE HCL 2 MG PO CAPS: 2.0000 mg | ORAL_CAPSULE | Freq: Four times a day (QID) | ORAL | 0 refills | Status: DC | PRN

## 2020-08-28 MED ORDER — CARVEDILOL PHOSPHATE ER 20 MG PO CP24: 20.0000 mg | ORAL_CAPSULE | Freq: Every day | ORAL | 0 refills | Status: DC

## 2020-08-28 MED ORDER — MAGNESIUM SULFATE 2 GM/50ML IV SOLN
2.0000 g | Freq: Once | INTRAVENOUS | Status: AC
  Administered 2020-08-28: 2 g via INTRAVENOUS
  Filled 2020-08-28: qty 50

## 2020-08-28 MED ORDER — HYDRALAZINE HCL 50 MG PO TABS: 50.0000 mg | ORAL_TABLET | Freq: Three times a day (TID) | ORAL | Status: DC | PRN

## 2020-08-28 NOTE — Telephone Encounter (Signed)
Spoke with Husband.   Per Husband he wanted to let office know that patient was in the hospital and she may not be able to come to appointment on 08/30/20.  Husband is also concerned that cardiology should be  Involved with patient hospital care since some of her medication have been changes.  RN informed husband Mr Forinash- he would have to informed  Hospitalist that Mr Nedd is requesting a consult with  Cardiology , otherwise  cardiology would have to see patient once discharge at a future office appointment.  Mr Tersigni verbalized understanding. He would like to wait until tomorrow 08/29/20 to see if patient will be discharge from hospital before canceling appointment with Dr Duke Salvia.

## 2020-08-28 NOTE — TOC Transition Note (Signed)
Transition of Care [facility]) - CM/SW Discharge Note   Patient Details  Name: Lauren Henry MRN: [id number] Date of Birth: 10/23/1941  [facility] CM/SW Contact:  Darleene Cleaver, LCSW Phone Number: 08/28/2020, 5:08 PM   Clinical Narrative:     Patient will be going home with home health PT and RN through [facility].  CSW signing off please reconsult with any other social work needs, home health agency has been notified of planned discharge.    Final next level of care: Home w Home Health Services Barriers to Discharge: Barriers Resolved   Patient Goals and CMS Choice Patient states their goals for this hospitalization and ongoing recovery are:: To go home with home health services. CMS Medicare.gov Compare Post Acute Care list provided to:: Patient Represenative (must comment) Choice offered to / list presented to : Spouse  Discharge Placement                  Name of family member notified: Patient's husband [name] [phone] Patient and family notified of of transfer: 08/28/20  Discharge Plan and Services                  DME Agency: NA       HH Arranged: RN, PT HH Agency: [facility] Date [facility] Agency Contacted: 08/28/20 Time HH Agency Contacted: 1707 Representative spoke with at [facility] Agency: Denyse Amass  Social Determinants of Health (SDOH) Interventions     Readmission Risk Interventions No flowsheet data found.

## 2020-08-28 NOTE — Discharge Summary (Addendum)
Physician Discharge Summary  Lauren Henry ION:[id number] DOB: [birth year] DOA: 08/23/2020  PCP: Gaspar Garbe, MD  Admit date: 08/23/2020 Discharge date: 08/28/2020 Consultations: None Admitted From: home Disposition: home  Discharge Diagnoses:  Principal Problem:   Acute renal failure superimposed on stage 3b chronic kidney disease (HCC) Active Problems:   Nausea vomiting and diarrhea   Gastroenteropathy   Essential hypertension   Hyperlipidemia   Hypothyroidism   PAF (paroxysmal atrial fibrillation) (HCC)   CAD in native artery   AKI (acute kidney injury) [facility])   Hospital Course Summary:  Chief compliant: Recurrent nausea, vomiting and diarrhea 79 y.o.femalewith medical history significant forPAF on Eliquis, CHB s/p PPM, CAD, CKD stage IIIb, HTN, HLD, and hypothyroidism who presents to the ED for recurrent nausea, vomiting, and diarrhea.Patient was recently admitted 08/20/2020-08/22/2020 for acute on chronic kidney injury secondary to dehydration fromabout 1.5 weeks ofnausea, vomiting, and diarrhea. She was treated supportively with aggressive IV fluid hydration. Creatinine trended down from 3.22 >>2.24 on day of discharge.  She was tolerating a diet and had no further diarrhea at time of discharge on 9/1.  She was barely home for 24 hours when her symptoms recurred (watery diarrhea, intractable nausea and vomiting) after eating couple of bites of lasagna for dinner.She tried to hydrate with Gatorade but was unable to able to drink half a bottle before symptoms flareup. She has some associated lightheadedness without syncope or fall. She came back to the ED for further evaluation. ED Course: Afebrile but had several bouts of loose stools while in the waiting room.  CT abdomen/pelvis on 9/2 showed diverticulosis without diverticulitis.  Absent gallbladder. Hospital course: Patient admitted to [facility] for recurrent gastroenteritis symptoms and associated worsening AKI (discharge  creatinine 2.2->2.52 on admission while baseline around 1.4-1.5).  She was started on IV hydration and supportive management.  Stool work-up pursued.  Olmesartan held in concern for possible drug-induced enteritis.  1.  Recurrent gastroenteritis: Patient transiently improved in recent hospitalization but readmitted within 24 hours with recurrent symptoms and worsening renal function.  Admitted with IV fluids and slow diet advancement.  Discontinued olmesartan by prior MD for possible medication induced enteropathy.  Celiac disease work-up sent but pending as of now.  She denies any prior history of gluten intolerance.  TSH WNL.  Her oral intake remained poor until 9/4 and improved somewhat 9/5 (was able to tolerate at least 50% of her lunch/dinner) but worsened with dry heaving and poor oral intake less than 25% on 08/27/20. Hence patient started on IV hydration/down graded diet to clear liquid diet, added IV PPI for possible gastritis. F/U abdominal x-ray unremarkable. Her condition is significantly improved this morning and stated " I feel great for the first time". She felt ready for advancing diet.  No nausea vomiting or diarrhea today and tolerated soft diet this afternoon. She is advised to take protonic BID X 1 week , then resume home dose daily.I could not pull up prior endoscopic work-up in care everywhere   (she follows Dr. Kinnie Scales at [facility]) but she reports nl EGD and colon polyps on colonoscopy done in 2019. She is advised to follow up primary GI in 2 weeks . 2.  Acute kidney injury on CKD stage IIIb: Present on admission. Patient's baseline creatinine is around 1.4-1.5.  She presented with creatinine 2.5 which has slowly improved with IV fluids support to baseline 1.4 - 1.5.  D/C IV fluids as oral intake improved.  Recent renal ultrasonogram in August showed bilateral cortical  thinning, no hydronephrosis.  Home medications including Aldactone, olmesartan discontinued.  Avoid hypotension and advise follow  up with PCP.  3.  Hypomagnesemia: Patient had magnesium level critically low at 1.2 on presentation, received IV replacement and improved to 1.7.  Will avoid further oral replacement as can exacerbate diarrhea.   4.  Paroxysmal atrial fibrillation, chronic heart block s/p pacemaker placement: Resumed oral anticoagulation.  Coreg ( take long acting 40 mg dose) held on admission due to dehydration/hypotension, now resumed at lower dose 6.25 MG bid. Since BP improved, will resume Coreg CR at 20mg  (HR in 60s but has pacemaker).  5.  Essential hypertension: Continue Coreg at lower dose as above. Olmesartan discontinued in concern for possible medication induced enteropathy and AKI.  Other medications including hydralazine, clonidine and Aldactone held during hospitalization in concern for hypotension on presentation and ongoing poor oral intake. Patient is scheduled to follow up her primary cardiology on Thursday 9/9 who can review further and advise.Patient can resume hydralazine if BP elevated at home on discharge as discussed with her and husband. No evidence of orthstatic change on BP check.Have also made referral to [facility] RN for medication assistance upon discharge.   6.  Hyperlipidemia: Continue Crestor  7.  Chronic normocytic anemia: Hemoglobin stable around 9-10.  Patient seen by GI at [facility] previously as discussed above.  No signs of acute GI bleed and remains on chronic anticoagulation.  8.  Hypothyroidism: Continue Synthroid.  TSH within normal limits.  Discharge Exam:   Vitals:   08/27/20 1707 08/27/20 2107 08/28/20 0516 08/28/20 0746  BP: 132/83 (!) 143/65 (!) 124/56 (!) 157/79  Pulse: 72 60 63 65  Resp:  16 18   Temp:  98 F (36.7 C) 98.6 F (37 C)   TempSrc:  Oral Oral   SpO2:  95% 98%   Weight:   84.5 kg   Height:        General: Pt is alert, awake, not in acute distress Cardiovascular: RRR, S1/S2 +, no rubs, no gallops Respiratory: CTA bilaterally, no wheezing, no  rhonchi Abdominal: Soft, NT, ND, bowel sounds + Extremities: no edema, no cyanosis  Discharge Condition:Stable CODE STATUS: Full code Diet recommendation: low salt, heart healthy Recommendations for Outpatient Follow-up:  1. Follow up with PCP: 1 week 2. Follow up with consultants: Cardiology as scheduled 08/30/20, primary GI in 2 weeks 3. Please obtain follow up labs including: CBC, BMP in 1 week  Home Health services upon discharge: HH PT, RN  Equipment/Devices upon discharge:   Discharge Instructions:  Discharge Instructions    Call MD for:  difficulty breathing, headache or visual disturbances   Complete by: As directed    Call MD for:  persistant dizziness or light-headedness   Complete by: As directed    Call MD for:  persistant nausea and vomiting   Complete by: As directed    Call MD for:  severe uncontrolled pain   Complete by: As directed    Call MD for:  temperature >100.4   Complete by: As directed    Diet - low sodium heart healthy   Complete by: As directed    Increase activity slowly   Complete by: As directed    Increase activity slowly   Complete by: As directed      Allergies as of 08/28/2020      Reactions   Amlodipine Swelling, Rash      Medication List    STOP taking these medications   cloNIDine 0.2  MG tablet Commonly known as: CATAPRES   olmesartan 40 MG tablet Commonly known as: BENICAR   spironolactone 25 MG tablet Commonly known as: ALDACTONE     TAKE these medications   apixaban 5 MG Tabs tablet Commonly known as: Eliquis Take 1 tablet (5 mg total) by mouth 2 (two) times daily.   carvedilol 20 MG 24 hr capsule Commonly known as: COREG CR Take 1 capsule (20 mg total) by mouth daily. What changed:   medication strength  how much to take   ferrous sulfate 325 (65 FE) MG tablet Take 325 mg by mouth daily with breakfast.   hydrALAZINE 50 MG tablet Commonly known as: APRESOLINE Take 1 tablet (50 mg total) by mouth 3 (three)  times daily as needed (SBP> 170, DBP>100). What changed:   how much to take  when to take this  reasons to take this  additional instructions   levothyroxine 150 MCG tablet Commonly known as: SYNTHROID Take 150 mcg by mouth daily before breakfast.   loperamide 2 MG capsule Commonly known as: IMODIUM Take 1 capsule (2 mg total) by mouth every 6 (six) hours as needed for diarrhea or loose stools.   mirtazapine 7.5 MG tablet Commonly known as: REMERON Take 7.5 mg by mouth at bedtime.   multivitamin with minerals Tabs tablet Take 1 tablet by mouth daily.   ondansetron 4 MG disintegrating tablet Commonly known as: ZOFRAN-ODT Take 4 mg by mouth every 8 (eight) hours as needed for nausea or vomiting.   pantoprazole 40 MG tablet Commonly known as: PROTONIX Take 40 mg by mouth daily.   rosuvastatin 40 MG tablet Commonly known as: CRESTOR TAKE 1 TABLET BY MOUTH DAILY   vitamin C 250 MG tablet Commonly known as: ASCORBIC ACID Take 250 mg by mouth daily.   VITAMIN D (CHOLECALCIFEROL) PO Take 1 tablet by mouth daily.       Follow-up Information    Tisovec, Adelfa Koh, MD Follow up in 1 week(s).   Specialty: Internal Medicine Why: Hospital discharge follow-up, repeat CBC/BMP at follow-up Contact information: [address] [place] [address] [phone]        Sharrell Ku, MD Follow up in 3 week(s).   Specialty: Gastroenterology Why: for diarrhea  Contact information: [address] [place] [address] [phone]              Allergies  Allergen Reactions  . Amlodipine Swelling and Rash      The results of significant diagnostics from this hospitalization (including imaging, microbiology, ancillary and laboratory) are listed below for reference.    Labs: BNP (last 3 results) No results for input(s): BNP in the last 8760 hours. Basic Metabolic Panel: Recent Labs  Lab 08/22/20 0452 08/23/20 1432 08/24/20 0453 08/25/20 0601  08/26/20 0612 08/27/20 0643 08/28/20 0524  NA 142   < > 141 142 140 139 139  K 3.8   < > 4.0 3.8 3.8 4.0 4.1  CL 104   < > 104 106 104 104 107  CO2 27   < > GLUCOSE 82   < > 58* 112* 114* 96 104*  BUN 33*   < > 34* CREATININE 2.24*   < > 2.45* 1.94* 1.69* 1.44* 1.56*  CALCIUM 8.6*   < > 8.0* 8.3* 8.6* 8.9 8.6*  MG 1.4*  --   --  1.2* 1.7 1.6*  --    < > = values in  this interval not displayed.   Liver Function Tests: Recent Labs  Lab 08/23/20 1432  AST 32  ALT 25  ALKPHOS 57  BILITOT 1.2  PROT 6.7  ALBUMIN 4.1   Recent Labs  Lab 08/25/20 0601  LIPASE 46   No results for input(s): AMMONIA in the last 168 hours. CBC: Recent Labs  Lab 08/22/20 0452 08/22/20 0452 08/23/20 1432 08/24/20 0453 08/25/20 0601 08/26/20 0612 08/27/20 0643  WBC 5.6   < > 10.0 4.6 7.9 5.9 7.6  NEUTROABS 3.4  --   --   --  6.4  --   --   HGB 9.3*   < > 11.4* 9.0* 9.3* 9.2* 9.8*  HCT 27.2*   < > 34.0* 27.7* 28.0* 28.2* 30.0*  MCV 91.9   < > 93.9 96.2 94.9 95.9 95.8  PLT 144*   < > 177 133* 142* 132* 146*   < > = values in this interval not displayed.   Cardiac Enzymes: No results for input(s): CKTOTAL, CKMB, CKMBINDEX, TROPONINI in the last 168 hours. BNP: Invalid input(s): POCBNP CBG: Recent Labs  Lab 08/24/20 0939 08/24/20 1208 08/24/20 1307 08/24/20 1447  GLUCAP 76 86 108* 98   D-Dimer No results for input(s): DDIMER in the last 72 hours. Hgb A1c No results for input(s): HGBA1C in the last 72 hours. Lipid Profile No results for input(s): CHOL, HDL, LDLCALC, TRIG, CHOLHDL, LDLDIRECT in the last 72 hours. Thyroid function studies No results for input(s): TSH, T4TOTAL, T3FREE, THYROIDAB in the last 72 hours.  Invalid input(s): FREET3 Anemia work up No results for input(s): VITAMINB12, FOLATE, FERRITIN, TIBC, IRON, RETICCTPCT in the last 72 hours. Urinalysis    Component Value Date/Time   COLORURINE AMBER (A) 08/24/2020 0140   APPEARANCEUR  HAZY (A) 08/24/2020 0140   LABSPEC 1.018 08/24/2020 0140   PHURINE 5.0 08/24/2020 0140   GLUCOSEU NEGATIVE 08/24/2020 0140   HGBUR NEGATIVE 08/24/2020 0140   BILIRUBINUR NEGATIVE 08/24/2020 0140   KETONESUR 5 (A) 08/24/2020 0140   PROTEINUR 100 (A) 08/24/2020 0140   UROBILINOGEN 0.2 10/26/2015 0957   NITRITE NEGATIVE 08/24/2020 0140   LEUKOCYTESUR NEGATIVE 08/24/2020 0140   Sepsis Labs Invalid input(s): PROCALCITONIN,  WBC,  LACTICIDVEN Microbiology Recent Results (from the past 240 hour(s))  SARS Coronavirus 2 by RT PCR (hospital order, performed in [facility] hospital lab) Nasopharyngeal Nasopharyngeal Swab     Status: None   Collection Time: 08/20/20 11:12 PM   Specimen: Nasopharyngeal Swab  Result Value Ref Range Status   SARS Coronavirus 2 NEGATIVE NEGATIVE Final    Comment: (NOTE) SARS-CoV-2 target nucleic acids are NOT DETECTED.  The SARS-CoV-2 RNA is generally detectable in upper and lower respiratory specimens during the acute phase of infection. The lowest concentration of SARS-CoV-2 viral copies this assay can detect is 250 copies / mL. A negative result does not preclude SARS-CoV-2 infection and should not be used as the sole basis for treatment or other patient management decisions.  A negative result may occur with improper specimen collection / handling, submission of specimen other than nasopharyngeal swab, presence of viral mutation(s) within the areas targeted by this assay, and inadequate number of viral copies (<250 copies / mL). A negative result must be combined with clinical observations, patient history, and epidemiological information.  Fact Sheet for Patients:   BoilerBrush.com.cy  Fact Sheet for Healthcare Providers: https://pope.com/  This test is not yet approved or  cleared by the [place] FDA and has been authorized for detection and/or  diagnosis of SARS-CoV-2 by FDA under an Emergency Use  Authorization (EUA).  This EUA will remain in effect (meaning this test can be used) for the duration of the COVID-19 declaration under Section 564(b)(1) of the Act, 21 U.S.C. section 360bbb-3(b)(1), unless the authorization is terminated or revoked sooner.  Performed at [facility], [address] [place], [place] [address]   SARS Coronavirus 2 by RT PCR (hospital order, performed in [facility] hospital lab) Nasopharyngeal Nasopharyngeal Swab     Status: None   Collection Time: 08/23/20  7:02 PM   Specimen: Nasopharyngeal Swab  Result Value Ref Range Status   SARS Coronavirus 2 NEGATIVE NEGATIVE Final    Comment: (NOTE) SARS-CoV-2 target nucleic acids are NOT DETECTED.  The SARS-CoV-2 RNA is generally detectable in upper and lower respiratory specimens during the acute phase of infection. The lowest concentration of SARS-CoV-2 viral copies this assay can detect is 250 copies / mL. A negative result does not preclude SARS-CoV-2 infection and should not be used as the sole basis for treatment or other patient management decisions.  A negative result may occur with improper specimen collection / handling, submission of specimen other than nasopharyngeal swab, presence of viral mutation(s) within the areas targeted by this assay, and inadequate number of viral copies (<250 copies / mL). A negative result must be combined with clinical observations, patient history, and epidemiological information.  Fact Sheet for Patients:   BoilerBrush.com.cy  Fact Sheet for Healthcare Providers: https://pope.com/  This test is not yet approved or  cleared by the [place] FDA and has been authorized for detection and/or diagnosis of SARS-CoV-2 by FDA under an Emergency Use Authorization (EUA).  This EUA will remain in effect (meaning this test can be used) for the duration of the COVID-19 declaration under Section 564(b)(1)  of the Act, 21 U.S.C. section 360bbb-3(b)(1), unless the authorization is terminated or revoked sooner.  Performed at [facility], [address], [place], [place] [address]   Gastrointestinal Panel by PCR , Stool     Status: None   Collection Time: 08/24/20  9:37 AM   Specimen: Stool  Result Value Ref Range Status   Campylobacter species NOT DETECTED NOT DETECTED Final   Plesimonas shigelloides NOT DETECTED NOT DETECTED Final   Salmonella species NOT DETECTED NOT DETECTED Final   Yersinia enterocolitica NOT DETECTED NOT DETECTED Final   Vibrio species NOT DETECTED NOT DETECTED Final   Vibrio cholerae NOT DETECTED NOT DETECTED Final   Enteroaggregative E coli (EAEC) NOT DETECTED NOT DETECTED Final   Enteropathogenic E coli (EPEC) NOT DETECTED NOT DETECTED Final   Enterotoxigenic E coli (ETEC) NOT DETECTED NOT DETECTED Final   Shiga like toxin producing E coli (STEC) NOT DETECTED NOT DETECTED Final   Shigella/Enteroinvasive E coli (EIEC) NOT DETECTED NOT DETECTED Final   Cryptosporidium NOT DETECTED NOT DETECTED Final   Cyclospora cayetanensis NOT DETECTED NOT DETECTED Final   Entamoeba histolytica NOT DETECTED NOT DETECTED Final   Giardia lamblia NOT DETECTED NOT DETECTED Final   Adenovirus F40/41 NOT DETECTED NOT DETECTED Final   Astrovirus NOT DETECTED NOT DETECTED Final   Norovirus GI/GII NOT DETECTED NOT DETECTED Final   Rotavirus A NOT DETECTED NOT DETECTED Final   Sapovirus (I, II, IV, and V) NOT DETECTED NOT DETECTED Final    Comment: Performed at [facility], [address] [place], [place] [address]  C Difficile Quick Screen w PCR reflex     Status: None  Collection Time: 08/24/20  9:37 AM   Specimen: Stool  Result Value Ref Range Status   C Diff antigen NEGATIVE NEGATIVE Final   C Diff toxin NEGATIVE NEGATIVE Final   C Diff interpretation No C. difficile detected.  Final    Comment: Performed at [facility], [address] [place], [place] [id number]    Procedures/Studies: CT ABDOMEN PELVIS WO CONTRAST  Result Date: 08/23/2020 CLINICAL DATA:  Nausea vomiting diarrhea EXAM: CT ABDOMEN AND PELVIS WITHOUT CONTRAST TECHNIQUE: Multidetector CT imaging of the abdomen and pelvis was performed following the standard protocol without IV contrast. COMPARISON:  None. FINDINGS: Lower chest: The visualized heart size within normal limits. No pericardial fluid/thickening. There is a small hiatal hernia present The visualized portions of the lungs are clear. Hepatobiliary: Although limited due to the lack of intravenous contrast, normal in appearance without gross focal abnormality. The patient is status post cholecystectomy. No biliary ductal dilation. Pancreas: Mild fatty atrophy of the pancreas is seen. No surrounding inflammatory changes. Spleen: Normal in size. Although limited due to the lack of intravenous contrast, normal in appearance. Adrenals/Urinary Tract: Both adrenal glands appear normal. The kidneys and collecting system appear normal without evidence of urinary tract calculus or hydronephrosis. Bladder is unremarkable. Stomach/Bowel: The patient has had a prior gastric bypass. The small bowel is unremarkable. Scattered colonic diverticula are noted without diverticulitis. Vascular/Lymphatic: There are no enlarged abdominal or pelvic lymph nodes. Scattered aortic atherosclerotic calcifications are seen without aneurysmal dilatation. Reproductive: The prostate is unremarkable. Other: A fat containing anterior umbilical hernia seen. Musculoskeletal: No acute or significant osseous findings. IMPRESSION: Diverticulosis without diverticulitis. Aortic Atherosclerosis (ICD10-I70.0). Electronically Signed   By: Jonna ClarkBindu  Avutu M.D.   On: 08/23/2020 19:32   US RENAL  Result Date: 08/24/2020 CLINICAL DATA:  Acute kidney injury. EXAM: RENAL / URINARY TRACT ULTRASOUND COMPLETE COMPARISON:  CT 08/23/2020.  Ultrasound 11/02/2018.  FINDINGS: Right Kidney: Renal measurements: 8.6 x 4.5 x 5.1 cm = volume: 104 mL. Cortical thinning. Echogenicity within normal limits. No mass or hydronephrosis visualized. Left Kidney: Renal measurements: 8.8 x 4.7 x 4.6 cm = volume: 100 mL. Cortical thinning. Echogenicity within normal limits. No mass or hydronephrosis visualized. Bladder: No urine noted in the bladder. Other: None. IMPRESSION: 1. Bilateral cortical thinning again noted. Normal renal echogenicity. 2.  No hydronephrosis.  No urine noted within the bladder. Electronically Signed   By: Maisie Fushomas  Register   On: 08/24/2020 07:46   DG Abd Portable 1V  Result Date: 08/27/2020 CLINICAL DATA:  Nausea and vomiting. EXAM: PORTABLE ABDOMEN - 1 VIEW COMPARISON:  CT, 08/23/2020 FINDINGS: Normal bowel gas pattern. No evidence of renal or ureteral stones. Surgical vascular clips in the upper abdomen are stable from previous gastric surgery. No acute skeletal abnormality. IMPRESSION: 1. No acute findings.  No bowel obstruction. Electronically Signed   By: Amie Portlandavid  Ormond M.D.   On: 08/27/2020 18:59   CUP PACEART REMOTE DEVICE CHECK  Result Date: 08/07/2020 Scheduled remote reviewed. Normal device function.  9 AMS, all < 1 min. OAC- Eliquis Next remote 91 days- JBox, RN/CVRS   Time coordinating discharge: Over 30 minutes  SIGNED:   Alessandra BevelsNeelima Sayla Golonka, MD  [facility] 08/28/2020, 3:23 PM

## 2020-08-28 NOTE — Telephone Encounter (Signed)
New message:    Patient husband calling concering his wife being in the hospital 9 days and he states that her medication is being changed in the hospital. He need some guides he need and would like for the doctor on call to go see her. Please call back.

## 2020-08-29 LAB — GLIADIN ANTIBODIES, SERUM
Antigliadin Abs, IgA: 3 units (ref 0–19)
Gliadin IgG: 2 units (ref 0–19)

## 2020-08-29 NOTE — Telephone Encounter (Signed)
Patient's husband calling to inform the patient will be at her appointment tomorrow.

## 2020-08-29 NOTE — Telephone Encounter (Signed)
Patient calling to speak with Jasmine December again.

## 2020-08-30 ENCOUNTER — Encounter: Payer: Self-pay | Admitting: Cardiovascular Disease

## 2020-08-30 ENCOUNTER — Other Ambulatory Visit: Payer: Self-pay

## 2020-08-30 ENCOUNTER — Ambulatory Visit: Payer: Medicare PPO | Admitting: Cardiovascular Disease

## 2020-08-30 VITALS — BP 162/78 | HR 88 | Ht 65.0 in | Wt 183.2 lb

## 2020-08-30 DIAGNOSIS — I2510 Atherosclerotic heart disease of native coronary artery without angina pectoris: Secondary | ICD-10-CM | POA: Diagnosis not present

## 2020-08-30 DIAGNOSIS — I480 Paroxysmal atrial fibrillation: Secondary | ICD-10-CM | POA: Diagnosis not present

## 2020-08-30 DIAGNOSIS — Z5181 Encounter for therapeutic drug level monitoring: Secondary | ICD-10-CM | POA: Diagnosis not present

## 2020-08-30 DIAGNOSIS — I10 Essential (primary) hypertension: Secondary | ICD-10-CM | POA: Diagnosis not present

## 2020-08-30 DIAGNOSIS — E7800 Pure hypercholesterolemia, unspecified: Secondary | ICD-10-CM

## 2020-08-30 DIAGNOSIS — N183 Chronic kidney disease, stage 3 unspecified: Secondary | ICD-10-CM | POA: Diagnosis not present

## 2020-08-30 DIAGNOSIS — Z950 Presence of cardiac pacemaker: Secondary | ICD-10-CM

## 2020-08-30 NOTE — Progress Notes (Signed)
Cardiology Office Note  Date:  08/30/2020   ID:  Lauren Henry, DOB [birth year], MRN [id number]  PCP:  Gaspar Garbe, MD  Cardiologist:   Chilton Si, MD   No chief complaint on file.    History of Present Illness: Lauren Henry is a 79 y.o. female with complete heart block s/p PPM, paroxysmal atrial fibrillation, mild-moderate CAD, and hypertensionhere for follow up.Lauren Henry had a pacemaker implanted by Dr. Ladona Ridgel in 2015 due to complete heart block. She was minimally symptomatic and reports that it was incidentally found when she presented for knee surgery. Since that time she has been doing well. She continues to have difficult to manage hypertension. She last followed up with Dr. Ladona Ridgel on 10/2017 and her blood pressure was elevated. Carvedilol was switched to twice daily dosing from once daily long-acting dosing. In the past she also needed frequent doses of clonidine prn.  Lauren Henry had LE swelling and a rash that were thought to be due to amlodipine. This was discontinued and scheduled clonidine was increased. She was also referred for an echo 09/13/18 that revealed LVEF 45-50% with diffuse hypokinesis and severe mitral annular calcification. She was referred for a Lexiscan Myoview10/17/19 that revealed a reversible defect in the inferior, apical and inferolateral regions. This was associated with apical akinesis and inferoapical dyskinesis. This was thought to be either ischemia or a pacemaker artifact. Given that she was feeling well and has chronic kidney disease she elected medical management. Clonidine was increased due to poorly controlled blood pressure. She was admitted with hypertensive urgency. Her blood pressure was as high as the 240s at home. Troponin was mildly elevated and she underwent cardiac catheterization 10/2018 that revealed mild to moderate non-obstructive CAD. There was some concern about the proximal LAD. However FFR was  unremarkable.  At her last appointment Lauren Henry' blood pressure was labile.  She was also having several falls.  Clonidine was reduced.  She followed up with Lauren Shelter, PA-C and at that time her blood pressure was better-controlled.  She noted that her morning blood pressures were little bit low so she switched spironolactone to the mornings.  Since that time she was admitted with nausea and vomiting and acute renal failure due to intravascular volume depletion.  CT revealed diverticulosis.  Olmesartan was held due to concern for drug-induced enteritis. Carvedilol was reduced and clonidine was held.  Since being discharged she has been feeling better though she did pull a muscle in her neck.  Since then she has been feeling otherwise well.  Her blood pressure has been running mostly in the one forties.  She has been holding clonidine and spironolactone.  Carvedilol was reduced to 20 mg.  She is finally having formed stools and her appetite has been good.  She is otherwise without complaint.  She has not yet taken her medication today.  Past Medical History:  Diagnosis Date  . Arthritis   . Atrial fibrillation (HCC)   . Diarrhea    since gallbladder removed  . 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 History of blood transfusion as a child   no abnormal reaction noted  . History of colon polyps   . Hyperlipidemia    takes Pravastatin daily  . Hypertension    takes Azor and Metoprolol daily  . Hypothyroidism    takes Synthroid daily  . Joint pain   . Presence of permanent cardiac  pacemaker   . Urinary frequency   . Urinary urgency     Past Surgical History:  Procedure Laterality Date  . ABDOMINAL HYSTERECTOMY    . cataract surgery Bilateral   . CHOLECYSTECTOMY  1971  . COLONOSCOPY    . EYE SURGERY    . INSERT / REPLACE / REMOVE PACEMAKER     [employer] PPM 11/01/14 (Dr. Lewayne BuntingGregg Taylor)  . JOINT REPLACEMENT Right    knee  . LEFT  HEART CATH AND CORONARY ANGIOGRAPHY N/A 11/01/2018   Procedure: LEFT HEART CATH AND CORONARY ANGIOGRAPHY;  Surgeon: Lauren Henry, Christopher, MD;  Location: [facility] INVASIVE CV [facility];  Service: Cardiovascular;  Laterality: N/A;  . PERMANENT PACEMAKER INSERTION N/A 11/01/2014   Procedure: PERMANENT PACEMAKER INSERTION;  Surgeon: Marinus MawGregg W Taylor, MD;  [facility];  Service: Cardiovascular;  Laterality: N/A;  . stomach stapled  1985  . TOTAL KNEE ARTHROPLASTY Left 11/05/2015   Procedure: TOTAL KNEE ARTHROPLASTY;  Surgeon: Dannielle HuhSteve Lucey, MD;  Location: [facility] OR;  Service: Orthopedics;  Laterality: Left;     Current Outpatient Medications  Medication Sig Dispense Refill  . apixaban (ELIQUIS) 5 MG TABS tablet Take 1 tablet (5 mg total) by mouth 2 (two) times daily. 180 tablet 1  . carvedilol (COREG CR) 40 MG 24 hr capsule Take 40 mg by mouth daily.    . ferrous sulfate 325 (65 FE) MG tablet Take 325 mg by mouth daily with breakfast.    . hydrALAZINE (APRESOLINE) 50 MG tablet Take 50 mg by mouth 3 (three) times daily.    Lauren Henry. levothyroxine (SYNTHROID, LEVOTHROID) 150 MCG tablet Take 150 mcg by mouth daily before breakfast.   0  . loperamide (IMODIUM) 2 MG capsule Take 1 capsule (2 mg total) by mouth every 6 (six) hours as needed for diarrhea or loose stools. 30 capsule 0  . mirtazapine (REMERON) 7.5 MG tablet Take 7.5 mg by mouth at bedtime.     . Multiple Vitamin (MULTIVITAMIN WITH MINERALS) TABS tablet Take 1 tablet by mouth daily.    . ondansetron (ZOFRAN-ODT) 4 MG disintegrating tablet Take 4 mg by mouth every 8 (eight) hours as needed for nausea or vomiting.    . pantoprazole (PROTONIX) 40 MG tablet Take 40 mg by mouth daily.  3  . rosuvastatin (CRESTOR) 40 MG tablet TAKE 1 TABLET BY MOUTH DAILY 90 tablet 3  . spironolactone (ALDACTONE) 25 MG tablet Take 25 mg by mouth daily.    . vitamin C (ASCORBIC ACID) 250 MG tablet Take 250 mg by mouth daily.    Lauren Henry. VITAMIN D, CHOLECALCIFEROL, PO Take 1 tablet by mouth  daily.     No current facility-administered medications for this visit.    Allergies:   Olmesartan and Amlodipine    Social History:  The patient  reports that she has never smoked. She has never used smokeless tobacco. She reports current alcohol use of about 1.0 standard drink of alcohol per week. She reports that she does not use drugs.   Family History:  The patient's family history includes Heart attack in her father; Heart disease in her father; Stroke in her sister.    ROS:  Please see the history of present illness.   Otherwise, review of systems are positive for none.   All other systems are reviewed and negative.    PHYSICAL EXAM: VS:  BP (!) 162/78   Pulse 88   Ht 5\' 5"  (1.651 m)   Wt 183 lb 3.2 oz (83.1 kg)  SpO2 98%   BMI 30.49 kg/m  , BMI Body mass index is 30.49 kg/m. GENERAL:  Well appearing HEENT:  Pupils equal round and reactive, fundi not visualized, oral mucosa unremarkable NECK:  No jugular venous distention, waveform within normal limits, carotid upstroke brisk and symmetric, no bruits LUNGS:  Clear to auscultation bilaterally HEART:  RRR.  PMI not displaced or sustained,S1 and S2 within normal limits, no S3, no S4, no clicks, no rubs, II/VI systolic murmur at the LUSB ABD:  Flat, positive bowel sounds normal in frequency in pitch, no bruits, no rebound, no guarding, no midline pulsatile mass, no hepatomegaly, no splenomegaly EXT:  2 plus pulses throughout, no edema, no cyanosis no clubbing SKIN:  No rashes no nodules NEURO:  Cranial nerves II through XII grossly intact, motor grossly intact throughout PSYCH:  Cognitively intact, oriented to person place and time   EKG:  EKG is not ordered today. The ekg ordered 09/09/18 demonstrates AS-VP.  Rate 71 bpm. 03/15/20: APVP.  Rate 63 bpm.     Echo 10/30/14: Study Conclusions  - Left ventricle: The cavity size was normal. There was mild focal basal hypertrophy of the septum. Systolic function was  normal. The estimated ejection fraction was in the range of 60% to 65%. Wall motion was normal; there were no regional wall motion abnormalities. - Aortic valve: Trileaflet; mildly thickened, mildly calcified leaflets. Transvalvular velocity was minimally increased. There was no stenosis. - Mitral valve: Calcified annulus. There was mild regurgitation. - Right ventricle: The cavity size was mildly dilated. Wall thickness was normal.  Echo 09/13/18: Study Conclusions  - Left ventricle: The cavity size was normal. There was mild focal basal hypertrophy of the septum. Systolic function was mildly reduced. The estimated ejection fraction was in the range of 45% to 50%. Diffuse hypokinesis. Doppler parameters are consistent with both elevated ventricular end-diastolic filling pressure and elevated left atrial filling pressure. - Mitral valve: Severely calcified annulus. Mildly thickened, mildly calcified leaflets . - Left atrium: The atrium was mildly dilated. - Atrial septum: No defect or patent foramen ovale was identified.  Lexiscan Myoview10/17/19:   The left ventricular ejection fraction is mildly decreased (45-54%).  Nuclear stress EF: 54%.  No T wave inversion was noted during stress.  There was no ST segment deviation noted during stress.  Defect 1: There is a large defect of moderate severity.  Large size, moderate severity partially reversible inferior, apical and inferolateral perfusion defect, suggestive of ischemia. This could also represent pacemaker-related artifact. There is apical akinesis and inferoapical dyskinesis. LVEF 54%. This is an intermediate risk study. Clinical correlation is advised.  LHC 10/2018: Conclusions: 1. Mild to moderate, non-obstructive CAD. Most severe lesion is a 50-60% stenosis in the proximal LCx that is not hemodynamically significant (DFR 1.0). I suspect mild troponin elevation reflects supply-demand  mismatch in the setting of uncontrolled hypertension and renal insufficiency. 2. Normal left ventricular filling pressure.  Recommendations: 1. Medical therapy and risk factor modification to prevent progression of disease. 2. Consider noninvasive evaluation for renal artery stenosis (if not already performed as an outpatient), given hypertension and renal insufficiency.  Recent Labs: 08/23/2020: ALT 25 08/25/2020: TSH 4.159 08/27/2020: Hemoglobin 9.8; Magnesium 1.6; Platelets 146 08/28/2020: BUN 11; Creatinine, Ser 1.56; Potassium 4.1; Sodium 139    Lipid Panel    Component Value Date/Time   CHOL 122 10/30/2018 1852   TRIG 51 10/30/2018 1852   HDL 52 10/30/2018 1852   CHOLHDL 2.3 10/30/2018 1852   VLDL 10  10/30/2018 1852   LDLCALC 60 10/30/2018 1852   07/22/2019: Total cholesterol 127, triglycerides 80, HDL 42, LDL 69  Wt Readings from Last 3 Encounters:  08/30/20 183 lb 3.2 oz (83.1 kg)  08/28/20 186 lb 4.6 oz (84.5 kg)  08/22/20 186 lb 1.6 oz (84.4 kg)      ASSESSMENT AND PLAN:  # PAF:  Currently atrial paced.  She is doing well.  Continue Eliquis and carvedilol.  Increase carvedilol back to 40 mg.  # Hypertension: Blood pressure is elevated today.  She has not yet taken her medication.  At home it has been running high because several medications were held in the setting of olmesartan-induced enteritis and AKI.  We will resume her spironolactone 25 mg daily.  Check a basic metabolic panel in a week.  Increase carvedilol back to 40 mg daily.  Continue hydralazine 50 mg 3 times daily.  I suspect we may need to add back her clonidine, but will await and see.  # Non-obstructive CAD: # Hyperlipidemia: Left heart cath 10/2018 showed nonobstructive disease. LDL was 69 on 06/2019. She is not on aspirin given that she takes Eliquis. Continue rosuvastatin.  Check fasting lipids and CMP in 1 week.  # CHB s/p PPM: Currently ASVP.Managed by Dr. Ladona Ridgel.      Current medicines  are reviewed at length with the patient today.  The patient does not have concerns regarding medicines.  The following changes have been made:  Afternoon hydralazine 100mg  daily   Labs/ tests ordered today include:   Orders Placed This Encounter  Procedures  . Lipid panel  . Comprehensive metabolic panel     Disposition:   FU with Adisa Vigeant C. , MD, [facility] in 6 months.  Follow-up with [facility] in 1 month.  Okay if it is virtual.    Signed, Cherine Drumgoole C. Lauren Shelter, MD, [facility]  08/30/2020 5:36 PM    [facility]

## 2020-08-30 NOTE — Patient Instructions (Addendum)
Medication Instructions:  RESUME YOUR SPIRONOLACTONE 25 MG DAILY  INCREASE YOUR CARVEDILOL TO 40 MG DAILY   *If you need a refill on your cardiac medications before your next appointment, please call your pharmacy*  Lab Work: FASTING LP/CMET IN 1 WEEK   If you have labs (blood work) drawn today and your tests are completely normal, you will receive your results only by: Marland Kitchen MyChart Message (if you have MyChart) OR . A paper copy in the mail If you have any lab test that is abnormal or we need to change your treatment, we will call you to review the results.  Testing/Procedures: NONE  Follow-Up: At [facility], you and your health needs are our priority.  As part of our continuing mission to provide you with exceptional heart care, we have created designated Provider Care Teams.  These Care Teams include your primary Cardiologist (physician) and Advanced Practice Providers (APPs -  Physician Assistants and Nurse Practitioners) who all work together to provide you with the care you need, when you need it.  We recommend signing up for the patient portal called "MyChart".  Sign up information is provided on this After Visit Summary.  MyChart is used to connect with patients for Virtual Visits (Telemedicine).  Patients are able to view lab/test results, encounter notes, upcoming appointments, etc.  Non-urgent messages can be sent to your provider as well.   To learn more about what you can do with MyChart, go to [email or web address].    Your next appointment:   4 week(s)  The format for your next appointment:   In Person  Provider:   WITH LUKE   Your physician wants you to follow-up in: DR Big Pool IN 6 MONTHS   Other Instructions MONITOR AND LOG YOUR BLOOD PRESSURE AT HOME, BRING READINGS TO YOUR FOLLOW UP

## 2020-09-03 DIAGNOSIS — I1310 Hypertensive heart and chronic kidney disease without heart failure, with stage 1 through stage 4 chronic kidney disease, or unspecified chronic kidney disease: Secondary | ICD-10-CM | POA: Diagnosis not present

## 2020-09-03 DIAGNOSIS — R197 Diarrhea, unspecified: Secondary | ICD-10-CM | POA: Diagnosis not present

## 2020-09-03 DIAGNOSIS — N179 Acute kidney failure, unspecified: Secondary | ICD-10-CM | POA: Diagnosis not present

## 2020-09-03 DIAGNOSIS — Z23 Encounter for immunization: Secondary | ICD-10-CM | POA: Diagnosis not present

## 2020-09-03 DIAGNOSIS — E860 Dehydration: Secondary | ICD-10-CM | POA: Diagnosis not present

## 2020-09-03 DIAGNOSIS — R112 Nausea with vomiting, unspecified: Secondary | ICD-10-CM | POA: Diagnosis not present

## 2020-09-07 DIAGNOSIS — E7800 Pure hypercholesterolemia, unspecified: Secondary | ICD-10-CM | POA: Diagnosis not present

## 2020-09-07 DIAGNOSIS — I10 Essential (primary) hypertension: Secondary | ICD-10-CM | POA: Diagnosis not present

## 2020-10-16 ENCOUNTER — Other Ambulatory Visit: Payer: Self-pay

## 2020-10-16 ENCOUNTER — Encounter: Payer: Self-pay | Admitting: Cardiovascular Disease

## 2020-10-16 ENCOUNTER — Ambulatory Visit: Payer: Medicare PPO | Admitting: Cardiovascular Disease

## 2020-10-16 ENCOUNTER — Encounter: Payer: Self-pay | Admitting: *Deleted

## 2020-10-16 VITALS — BP 148/82 | HR 73 | Ht 65.0 in | Wt 188.2 lb

## 2020-10-16 DIAGNOSIS — I480 Paroxysmal atrial fibrillation: Secondary | ICD-10-CM | POA: Diagnosis not present

## 2020-10-16 DIAGNOSIS — E7800 Pure hypercholesterolemia, unspecified: Secondary | ICD-10-CM | POA: Diagnosis not present

## 2020-10-16 DIAGNOSIS — I10 Essential (primary) hypertension: Secondary | ICD-10-CM

## 2020-10-16 DIAGNOSIS — R0989 Other specified symptoms and signs involving the circulatory and respiratory systems: Secondary | ICD-10-CM | POA: Diagnosis not present

## 2020-10-16 DIAGNOSIS — I442 Atrioventricular block, complete: Secondary | ICD-10-CM

## 2020-10-16 DIAGNOSIS — I2510 Atherosclerotic heart disease of native coronary artery without angina pectoris: Secondary | ICD-10-CM

## 2020-10-16 MED ORDER — CLONIDINE HCL 0.1 MG PO TABS: 0.1000 mg | ORAL_TABLET | Freq: Two times a day (BID) | ORAL | 3 refills | Status: DC

## 2020-10-16 MED ORDER — CARVEDILOL PHOSPHATE ER 20 MG PO CP24: 20.0000 mg | ORAL_CAPSULE | Freq: Every day | ORAL | 3 refills | Status: DC

## 2020-10-16 NOTE — Patient Instructions (Addendum)
Medication Instructions:  DECREASE YOUR CARVEDILOL BACK TO 20 MG DAILY  RESUME CLONIDINE 0.1 MG TWICE A DAY   *If you need a refill on your cardiac medications before your next appointment, please call your pharmacy*  Lab Work: FASTING LP/CMET TODAY/SOON  If you have labs (blood work) drawn today and your tests are completely normal, you will receive your results only by: Marland Kitchen MyChart Message (if you have MyChart) OR . A paper copy in the mail If you have any lab test that is abnormal or we need to change your treatment, we will call you to review the results.  Testing/Procedures: NONE  Follow-Up: At [facility], you and your health needs are our priority.  As part of our continuing mission to provide you with exceptional heart care, we have created designated Provider Care Teams.  These Care Teams include your primary Cardiologist (physician) and Advanced Practice Providers (APPs -  Physician Assistants and Nurse Practitioners) who all work together to provide you with the care you need, when you need it.  We recommend signing up for the patient portal called "MyChart".  Sign up information is provided on this After Visit Summary.  MyChart is used to connect with patients for Virtual Visits (Telemedicine).  Patients are able to view lab/test results, encounter notes, upcoming appointments, etc.  Non-urgent messages can be sent to your provider as well.   To learn more about what you can do with MyChart, go to [email or web address].    Your next appointment:   6 month(s) You will receive a reminder letter in the mail two months in advance. If you don't receive a letter, please call our office to schedule the follow-up appointment.  The format for your next appointment:   In Person  Provider:   You may see Chilton Si, MD or one of the following Advanced Practice Providers on your designated Care Team:    Corine Shelter, PA-C  [place], [place]  Edd Fabian, [place]  Your  physician recommends that you schedule a follow-up appointment in: 2 MONTHS WITH PHARM D (RAQUEL)

## 2020-10-16 NOTE — Progress Notes (Signed)
Cardiology Office Note  Date:  10/16/2020   ID:  Lauren Henry, DOB 01/14/1941, MRN [id number]  PCP:  Gaspar Garbeisovec, Richard W, MD  Cardiologist:   Chilton Siiffany Noorvik, MD   No chief complaint on file.    History of Present Illness: Lauren Henry is a 79 y.o. female with complete heart block s/p PPM, paroxysmal atrial fibrillation, mild-moderate CAD, and hypertensionhere for follow up.Lauren Henry had a pacemaker implanted by Dr. Ladona Ridgelaylor in 2015 due to complete heart block. She was minimally symptomatic and reports that it was incidentally found when she presented for knee surgery. Since that time she has been doing well. She continues to have difficult to manage hypertension. She last followed up with Dr. Ladona Ridgelaylor on 10/2017 and her blood pressure was elevated. Carvedilol was switched to twice daily dosing from once daily long-acting dosing. In the past she also needed frequent doses of clonidine prn.  Lauren Henry had LE swelling and a rash that were thought to be due to amlodipine. This was discontinued and scheduled clonidine was increased. She was also referred for an echo 09/13/18 that revealed LVEF 45-50% with diffuse hypokinesis and severe mitral annular calcification. She was referred for a Lexiscan Myoview10/17/19 that revealed a reversible defect in the inferior, apical and inferolateral regions. This was associated with apical akinesis and inferoapical dyskinesis. This was thought to be either ischemia or a pacemaker artifact. Given that she was feeling well and has chronic kidney disease she elected medical management. Clonidine was increased due to poorly controlled blood pressure. She was admitted with hypertensive urgency. Her blood pressure was as high as the 240s at home. Troponin was mildly elevated and she underwent cardiac catheterization 10/2018 that revealed mild to moderate non-obstructive CAD. There was some concern about the proximal LAD. However FFR was  unremarkable.  Lauren Henry' blood pressure was labile.  She was also having several falls.  Clonidine was reduced.  She followed up with Corine ShelterLuke Kilroy, PA-C and at that time her blood pressure was better-controlled.  She noted that her morning blood pressures were little bit low so she switched spironolactone to the mornings.  Since that time she was admitted with nausea and vomiting and acute renal failure due to intravascular volume depletion.  CT revealed diverticulosis.  Olmesartan was held due to concern for drug-induced enteritis. Carvedilol was reduced and clonidine was held.  Since being discharged she has been feeling better though she did pull a muscle in her neck.  Since then she has been feeling otherwise well.  Her blood pressure has been running mostly in the one forties.  She has been holding clonidine and spironolactone.  Carvedilol was reduced to 20 mg.  She is finally having formed stools and her appetite has been good.  She is otherwise without complaint.  She has not yet taken her medication today.  At her last appointment her blood pressure was elevated.  Her antihypertensive and has previously been held in the setting of enteritis related to olmesartan.  Carvedilol was increased back to 40 mg.  Lately she has been feeling stressed.  She has been taking care of her husband.  Her BP has been somewhat labile ranging from the 100-160s/60-70s.  She has struggled with a rash on the back of her right calf.  She had this in the past.  When all her medications were stopped in the hospital it went away.  It was okay until her last appointment, at which time the carvedilol was increased.  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  She is otherwise well and without complaint.   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, developed CHB 10/2014 s/p  Medtronic PPM; atrial fib (PAF)   History of blood transfusion as a child   no abnormal reaction noted   History of colon polyps    Hyperlipidemia    takes Pravastatin daily   Hypertension    takes Azor and Metoprolol daily   Hypothyroidism    takes Synthroid daily   Joint pain    Presence of permanent cardiac pacemaker    Urinary frequency    Urinary urgency     Past Surgical History:  Procedure Laterality Date   ABDOMINAL HYSTERECTOMY     cataract surgery Bilateral    CHOLECYSTECTOMY  1971   COLONOSCOPY     EYE SURGERY     INSERT / REPLACE / REMOVE PACEMAKER     Medtronic PPM 11/01/14 (Dr. Lewayne Bunting)   JOINT REPLACEMENT Right    knee   LEFT HEART CATH AND CORONARY ANGIOGRAPHY N/A 11/01/2018   Procedure: LEFT HEART CATH AND CORONARY ANGIOGRAPHY;  Surgeon: Yvonne Kendall, MD;  Location: [facility] INVASIVE CV LAB;  Service: Cardiovascular;  Laterality: N/A;   PERMANENT PACEMAKER INSERTION N/A 11/01/2014   Procedure: PERMANENT PACEMAKER INSERTION;  Surgeon: Marinus Maw, MD;  Location: [facility] CATH LAB;  Service: Cardiovascular;  Laterality: N/A;   stomach stapled  1985   TOTAL KNEE ARTHROPLASTY Left 11/05/2015   Procedure: TOTAL KNEE ARTHROPLASTY;  Surgeon: Dannielle Huh, MD;  Location: [facility] OR;  Service: Orthopedics;  Laterality: Left;     Current Outpatient Medications  Medication Sig Dispense Refill   apixaban (ELIQUIS) 5 MG TABS tablet Take 1 tablet (5 mg total) by mouth 2 (two) times daily. 180 tablet 1   carvedilol (COREG CR) 20 MG 24 hr capsule Take 1 capsule (20 mg total) by mouth daily. 90 capsule 3   ferrous sulfate 325 (65 FE) MG tablet Take 325 mg by mouth daily with breakfast.     hydrALAZINE (APRESOLINE) 50 MG tablet Take 50 mg by mouth 3 (three) times daily.     levothyroxine (SYNTHROID, LEVOTHROID) 150 MCG tablet Take 150 mcg by mouth daily before breakfast.   0   loperamide (IMODIUM) 2 MG capsule Take 1 capsule (2 mg total) by mouth every 6  (six) hours as needed for diarrhea or loose stools. 30 capsule 0   mirtazapine (REMERON) 7.5 MG tablet Take 7.5 mg by mouth at bedtime.      Multiple Vitamin (MULTIVITAMIN WITH MINERALS) TABS tablet Take 1 tablet by mouth daily.     ondansetron (ZOFRAN-ODT) 4 MG disintegrating tablet Take 4 mg by mouth every 8 (eight) hours as needed for nausea or vomiting.     pantoprazole (PROTONIX) 40 MG tablet Take 40 mg by mouth daily.  3   rosuvastatin (CRESTOR) 40 MG tablet TAKE 1 TABLET BY MOUTH DAILY 90 tablet 3   spironolactone (ALDACTONE) 25 MG tablet Take 25 mg by mouth daily.     vitamin C (ASCORBIC ACID) 250 MG tablet Take 250 mg by mouth daily.     VITAMIN D, CHOLECALCIFEROL, PO Take 1 tablet by mouth daily.     cloNIDine (CATAPRES) 0.1 MG tablet Take 1 tablet (0.1 mg total)  by mouth 2 (two) times daily. 180 tablet 3   No current facility-administered medications for this visit.    Allergies:   Olmesartan and Amlodipine    Social History:  The patient  reports that she has never smoked. She has never used smokeless tobacco. She reports current alcohol use of about 1.0 standard drink of alcohol per week. She reports that she does not use drugs.   Family History:  The patient's family history includes Heart attack in her father; Heart disease in her father; Stroke in her sister.    ROS:  Please see the history of present illness.   Otherwise, review of systems are positive for none.   All other systems are reviewed and negative.    PHYSICAL EXAM: VS:  BP (!) 148/82    Pulse 73    Ht 5\' 5"  (1.651 m)    Wt 188 lb 3.2 oz (85.4 kg)    SpO2 99%    BMI 31.32 kg/m  , BMI Body mass index is 31.32 kg/m. GENERAL:  Well appearing HEENT:  Pupils equal round and reactive, fundi not visualized, oral mucosa unremarkable NECK:  No jugular venous distention, waveform within normal limits, carotid upstroke brisk and symmetric, no bruits LUNGS:  Clear to auscultation bilaterally HEART:  RRR.  PMI  not displaced or sustained,S1 and S2 within normal limits, no S3, no S4, no clicks, no rubs, II/VI systolic murmur at the LUSB ABD:  Flat, positive bowel sounds normal in frequency in pitch, no bruits, no rebound, no guarding, no midline pulsatile mass, no hepatomegaly, no splenomegaly EXT:  2 plus pulses throughout, no edema, no cyanosis no clubbing SKIN:  No rashes no nodules NEURO:  Cranial nerves II through XII grossly intact, motor grossly intact throughout PSYCH:  Cognitively intact, oriented to person place and time   EKG:  EKG is not ordered today. The ekg ordered 09/09/18 demonstrates AS-VP.  Rate 71 bpm. 03/15/20: APVP.  Rate 63 bpm.     Echo 10/30/14: Study Conclusions  - Left ventricle: The cavity size was normal. There was mild focal basal hypertrophy of the septum. Systolic function was normal. The estimated ejection fraction was in the range of 60% to 65%. Wall motion was normal; there were no regional wall motion abnormalities. - Aortic valve: Trileaflet; mildly thickened, mildly calcified leaflets. Transvalvular velocity was minimally increased. There was no stenosis. - Mitral valve: Calcified annulus. There was mild regurgitation. - Right ventricle: The cavity size was mildly dilated. Wall thickness was normal.  Echo 09/13/18: Study Conclusions  - Left ventricle: The cavity size was normal. There was mild focal basal hypertrophy of the septum. Systolic function was mildly reduced. The estimated ejection fraction was in the range of 45% to 50%. Diffuse hypokinesis. Doppler parameters are consistent with both elevated ventricular end-diastolic filling pressure and elevated left atrial filling pressure. - Mitral valve: Severely calcified annulus. Mildly thickened, mildly calcified leaflets . - Left atrium: The atrium was mildly dilated. - Atrial septum: No defect or patent foramen ovale was identified.  Lexiscan  Myoview10/17/19:   The left ventricular ejection fraction is mildly decreased (45-54%).  Nuclear stress EF: 54%.  No T wave inversion was noted during stress.  There was no ST segment deviation noted during stress.  Defect 1: There is a large defect of moderate severity.  Large size, moderate severity partially reversible inferior, apical and inferolateral perfusion defect, suggestive of ischemia. This could also represent pacemaker-related artifact. There is apical akinesis and inferoapical dyskinesis.  LVEF 54%. This is an intermediate risk study. Clinical correlation is advised.  LHC 10/2018: Conclusions: 1. Mild to moderate, non-obstructive CAD. Most severe lesion is a 50-60% stenosis in the proximal LCx that is not hemodynamically significant (DFR 1.0). I suspect mild troponin elevation reflects supply-demand mismatch in the setting of uncontrolled hypertension and renal insufficiency. 2. Normal left ventricular filling pressure.  Recommendations: 1. Medical therapy and risk factor modification to prevent progression of disease. 2. Consider noninvasive evaluation for renal artery stenosis (if not already performed as an outpatient), given hypertension and renal insufficiency.  Recent Labs: 08/23/2020: ALT 25 08/25/2020: TSH 4.159 08/27/2020: Hemoglobin 9.8; Magnesium 1.6; Platelets 146 08/28/2020: BUN 11; Creatinine, Ser 1.56; Potassium 4.1; Sodium 139    Lipid Panel    Component Value Date/Time   CHOL 122 10/30/2018 1852   TRIG 51 10/30/2018 1852   HDL 52 10/30/2018 1852   CHOLHDL 2.3 10/30/2018 1852   VLDL 10 10/30/2018 1852   LDLCALC 60 10/30/2018 1852   07/22/2019: Total cholesterol 127, triglycerides 80, HDL 42, LDL 69  Wt Readings from Last 3 Encounters:  10/16/20 188 lb 3.2 oz (85.4 kg)  08/30/20 183 lb 3.2 oz (83.1 kg)  08/28/20 186 lb 4.6 oz (84.5 kg)      ASSESSMENT AND PLAN:  # PAF:   Rate is stable.  Her heart rate is regular on exam.  Given that  she had no rash on her carvedilol was at 20 mg and it has recurred, we will go back to 20 mg.  Continue Eliquis.  # Hypertension:  Blood pressure is labile.  It is been somewhat difficult to control since she was in the hospital.  Continue hydralazine and reduce carvedilol back to 20 mg as above.  Continue spironolactone.  We will resume her clonidine at 0.1 mg twice daily.  She will continue checking her blood pressures and follow-up with our pharmacist in 2 months.  # Non-obstructive CAD: # Hyperlipidemia: Left heart cath 10/2018 showed nonobstructive disease. LDL was 69 on 06/2019. She is not on aspirin given that she takes Eliquis. Continue rosuvastatin.  Check fasting lipids and CMP in 1 week.  # CHB s/p PPM: Currently ASVP.Managed by Dr. Ladona Ridgel.  Already ordered    Current medicines are reviewed at length with the patient today.  The patient does not have concerns regarding medicines.  The following changes have been made: Reduce carvedilol to 20 mg.  Start clonidine 0.1 mg twice daily.  Labs/ tests ordered today include:   Orders Placed This Encounter  Procedures   VAS US CAROTID     Disposition:   FU with Barbera Perritt C. Duke Salvia, MD, [facility] in 6 months.  Raquel in 2 months.    Signed, Teddy Pena C. Duke Salvia, MD, [facility]  10/16/2020 1:20 PM    [facility]

## 2020-10-25 DIAGNOSIS — L820 Inflamed seborrheic keratosis: Secondary | ICD-10-CM | POA: Diagnosis not present

## 2020-10-25 DIAGNOSIS — L570 Actinic keratosis: Secondary | ICD-10-CM | POA: Diagnosis not present

## 2020-11-01 ENCOUNTER — Other Ambulatory Visit: Payer: Self-pay

## 2020-11-01 ENCOUNTER — Other Ambulatory Visit (HOSPITAL_COMMUNITY): Payer: Self-pay | Admitting: Cardiovascular Disease

## 2020-11-01 ENCOUNTER — Ambulatory Visit (HOSPITAL_COMMUNITY)
Admission: RE | Admit: 2020-11-01 | Discharge: 2020-11-01 | Disposition: A | Discharge status: Home / Self Care | Payer: Medicare PPO | Source: Ambulatory Visit | Attending: Cardiology | Admitting: Cardiology

## 2020-11-01 DIAGNOSIS — I6523 Occlusion and stenosis of bilateral carotid arteries: Secondary | ICD-10-CM

## 2020-11-01 DIAGNOSIS — R0989 Other specified symptoms and signs involving the circulatory and respiratory systems: Secondary | ICD-10-CM

## 2020-11-02 DIAGNOSIS — H35033 Hypertensive retinopathy, bilateral: Secondary | ICD-10-CM | POA: Diagnosis not present

## 2020-11-02 DIAGNOSIS — H52223 Regular astigmatism, bilateral: Secondary | ICD-10-CM | POA: Diagnosis not present

## 2020-11-02 DIAGNOSIS — H524 Presbyopia: Secondary | ICD-10-CM | POA: Diagnosis not present

## 2020-11-02 DIAGNOSIS — H5213 Myopia, bilateral: Secondary | ICD-10-CM | POA: Diagnosis not present

## 2020-11-02 DIAGNOSIS — H40052 Ocular hypertension, left eye: Secondary | ICD-10-CM | POA: Diagnosis not present

## 2020-11-06 ENCOUNTER — Ambulatory Visit (INDEPENDENT_AMBULATORY_CARE_PROVIDER_SITE_OTHER): Payer: Medicare PPO

## 2020-11-06 DIAGNOSIS — I442 Atrioventricular block, complete: Secondary | ICD-10-CM | POA: Diagnosis not present

## 2020-11-08 LAB — CUP PACEART REMOTE DEVICE CHECK
Battery Impedance: 456 Ohm
Battery Remaining Longevity: 87 mo
Battery Voltage: 2.78 V
Brady Statistic AP VP Percent: 34 %
Brady Statistic AP VS Percent: 0 %
Brady Statistic AS VP Percent: 66 %
Brady Statistic AS VS Percent: 0 %
Date Time Interrogation Session: 20211117164755
Implantable Lead Implant Date: 20151111
Implantable Lead Implant Date: 20151111
Implantable Lead Location: 753859
Implantable Lead Location: 753860
Implantable Lead Model: 5076
Implantable Lead Model: 5076
Implantable Pulse Generator Implant Date: 20151111
Lead Channel Impedance Value: 453 Ohm
Lead Channel Impedance Value: 623 Ohm
Lead Channel Pacing Threshold Amplitude: 0.75 V
Lead Channel Pacing Threshold Amplitude: 1.5 V
Lead Channel Pacing Threshold Pulse Width: 0.4 ms
Lead Channel Pacing Threshold Pulse Width: 0.4 ms
Lead Channel Setting Pacing Amplitude: 1.5 V
Lead Channel Setting Pacing Amplitude: 2.5 V
Lead Channel Setting Pacing Pulse Width: 0.46 ms
Lead Channel Setting Sensing Sensitivity: 2 mV

## 2020-11-08 NOTE — Progress Notes (Signed)
Remote pacemaker transmission.   

## 2020-12-10 ENCOUNTER — Other Ambulatory Visit: Payer: Self-pay

## 2020-12-10 ENCOUNTER — Ambulatory Visit (INDEPENDENT_AMBULATORY_CARE_PROVIDER_SITE_OTHER): Payer: Medicare PPO | Admitting: Pharmacist Clinician (PhC)/ Clinical Pharmacy Specialist

## 2020-12-10 DIAGNOSIS — I10 Essential (primary) hypertension: Secondary | ICD-10-CM | POA: Diagnosis not present

## 2020-12-10 NOTE — Assessment & Plan Note (Signed)
Patient with essential hypertension, having much better control at home.  Home readings show much better results than her office readings.  Will have her continue with current regimen and monitor home pressures 2-3 times each week.  She should call the office should home readings start to trend > 140 systolic.

## 2020-12-10 NOTE — Progress Notes (Signed)
12/10/2020 Lauren Henry 1941/11/15 [id number]   HPI:  Lauren Henry is a 79 y.o. female patient of Dr Duke Salvia, with a PMH below who presents today for hypertension clinic evaluation.   She has had problems with labile hypertension for several years and at her last visit was noted to be 148/82.  She was hospitalized twice in late August/early September due to olmesartan induced enteritis.  She was severely dehydrated during this episode and SCr peaked at 3.2.  By second discharge was back down to baseline, at 1.56.    She is feeling much better now, with no recent GI issues.  No other concerns with her medications today and states no problems with compliance.      Past Medical History: Heart block Complete, pacemaker placed 2015  hyperlipidemia Last LDL (2019) at 60 - on rosuvastatin 40 mg  Paroxysmal AF CHADS2-VASc score - 5 (htn, age x 2 female, cad) - on Eliquis  CAD Non-obstructive  CKD Stage 3, GFR at 27     Blood Pressure Goal:  130/80  Current Medications: carvedilol CR 20 mg qd (afternoon); hydralzine 50 mg tid, spironolactone 25 mg qd (am), clonidine 0.1 mg bid (afternoon and hs)  Family Hx: father with heart disease and MI, sister with stroke  Social Hx: no tobacco; rare alcohol, occasional coffee  Diet: eating better since enteritis episode - mostly home cooked, no fried, lots of vegetables (fresh, frozen and canned)  Exercise:  Previous knee replacements, notes that her knees still stiffen up in cold weather; 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,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), so stays active   Home BP readings: averages: 24 morning readings 125/63; 21 evening readings 130/61  Only 3 systolic readings > 140, diastolic all WNL  Intolerances: olmesartan - enteritis; amlodipine - edema  Labs: 9/21:  Na 139, K 4.1, Glu 104, BUN 11, SCr 1.56 GFR 31  Wt Readings from Last 3 Encounters:  12/10/20 188 lb 9.6 oz (85.5 kg)  10/16/20 188 lb 3.2 oz (85.4 kg)  08/30/20 183 lb  3.2 oz (83.1 kg)   BP Readings from Last 3 Encounters:  12/10/20 (!) 160/78  10/16/20 (!) 148/82  08/30/20 (!) 162/78   Pulse Readings from Last 3 Encounters:  12/10/20 81  10/16/20 73  08/30/20 88    Current Outpatient Medications  Medication Sig Dispense Refill   apixaban (ELIQUIS) 5 MG TABS tablet Take 1 tablet (5 mg total) by mouth 2 (two) times daily. 180 tablet 1   carvedilol (COREG CR) 20 MG 24 hr capsule Take 1 capsule (20 mg total) by mouth daily. 90 capsule 3   cloNIDine (CATAPRES) 0.1 MG tablet Take 1 tablet (0.1 mg total) by mouth 2 (two) times daily. 180 tablet 3   ferrous sulfate 325 (65 FE) MG tablet Take 325 mg by mouth daily with breakfast.     hydrALAZINE (APRESOLINE) 50 MG tablet Take 50 mg by mouth 3 (three) times daily.     levothyroxine (SYNTHROID, LEVOTHROID) 150 MCG tablet Take 150 mcg by mouth daily before breakfast.   0   mirtazapine (REMERON) 7.5 MG tablet Take 7.5 mg by mouth at bedtime.      Multiple Vitamin (MULTIVITAMIN WITH MINERALS) TABS tablet Take 1 tablet by mouth daily.     pantoprazole (PROTONIX) 40 MG tablet Take 40 mg by mouth daily.  3   rosuvastatin (CRESTOR) 40 MG tablet TAKE 1 TABLET BY MOUTH DAILY 90 tablet 3   spironolactone (ALDACTONE)  25 MG tablet Take 25 mg by mouth daily.     vitamin C (ASCORBIC ACID) 250 MG tablet Take 250 mg by mouth daily.     VITAMIN D, CHOLECALCIFEROL, PO Take 1 tablet by mouth daily.     No current facility-administered medications for this visit.    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 rate 16, height 5\' 5"  (1.651 m), weight 188 lb 9.6 oz (85.5 kg), SpO2 95 %.  Essential hypertension Patient with essential hypertension, having much better control at home.  Home readings show much better results than her office readings.  Will have her continue with current regimen and monitor home pressures 2-3 times each week.  She should call the office should home readings start to trend > 140 systolic.     PharmD CPP [facility] [address] [place], [place] [phone]

## 2020-12-10 NOTE — Patient Instructions (Signed)
  Check your blood pressure at home 2-3 times each week and keep record of the readings.  Take your BP meds as follows:  Continue with all current medications  Bring all of your meds, your BP cuff and your record of home blood pressures to your next appointment.  Exercise as you're able, try to walk approximately 30 minutes per day.  Keep salt intake to a minimum, especially watch canned and prepared boxed foods.  Eat more fresh fruits and vegetables and fewer canned items.  Avoid eating in fast food restaurants.    HOW TO TAKE YOUR BLOOD PRESSURE: . Rest 5 minutes before taking your blood pressure. .  Don't smoke or drink caffeinated beverages for at least 30 minutes before. . Take your blood pressure before (not after) you eat. . Sit comfortably with your back supported and both feet on the floor (don't cross your legs). . Elevate your arm to heart level on a table or a desk. . Use the proper sized cuff. It should fit smoothly and snugly around your bare upper arm. There should be enough room to slip a fingertip under the cuff. The bottom edge of the cuff should be 1 inch above the crease of the elbow. . Ideally, take 3 measurements at one sitting and record the average.

## 2020-12-20 ENCOUNTER — Other Ambulatory Visit: Payer: Self-pay | Admitting: Cardiovascular Disease

## 2021-01-11 ENCOUNTER — Other Ambulatory Visit: Payer: Self-pay | Admitting: Cardiovascular Disease

## 2021-01-23 ENCOUNTER — Encounter: Payer: Self-pay | Admitting: Internal Medicine

## 2021-01-23 ENCOUNTER — Other Ambulatory Visit: Payer: Self-pay

## 2021-01-23 ENCOUNTER — Ambulatory Visit: Payer: Medicare PPO | Admitting: Internal Medicine

## 2021-01-23 VITALS — BP 158/80 | HR 69 | Ht 65.0 in | Wt 192.8 lb

## 2021-01-23 DIAGNOSIS — I442 Atrioventricular block, complete: Secondary | ICD-10-CM

## 2021-01-23 DIAGNOSIS — I10 Essential (primary) hypertension: Secondary | ICD-10-CM | POA: Diagnosis not present

## 2021-01-23 DIAGNOSIS — Z950 Presence of cardiac pacemaker: Secondary | ICD-10-CM

## 2021-01-23 NOTE — Patient Instructions (Signed)
Medication Instructions:  Your physician recommends that you continue on your current medications as directed. Please refer to the Current Medication list given to you today.  Labwork: None ordered.  Testing/Procedures: None ordered.  Follow-Up: Your physician wants you to follow-up in: one year with Lewayne Bunting, MD or one of the following Advanced Practice Providers on your designated Care Team:    Gypsy Balsam, NP  Francis Dowse, PA-C  Casimiro Needle "Mardelle Matte" [place], [place]  Remote monitoring is used to monitor your Pacemaker from home. This monitoring reduces the number of office visits required to check your device to one time per year. It allows [place] to keep an eye on the functioning of your device to ensure it is working properly. You are scheduled for a device check from home on 02/05/2021. You may send your transmission at any time that day. If you have a wireless device, the transmission will be sent automatically. After your physician reviews your transmission, you will receive a postcard with your next transmission date.  Any Other Special Instructions Will Be Listed Below (If Applicable).  If you need a refill on your cardiac medications before your next appointment, please call your pharmacy.

## 2021-01-23 NOTE — Progress Notes (Signed)
HPI Lauren Henry returns today for followup. She is a pleasant 80 yo woman with CHB, s/p PPM, HTN, non-obstructive CAD, and chronic renal insufficiency. She has done well in the interim with no chest pain, sob, edema or syncope. She has not been in the hospital. Her bp is at times elevated. Allergies  Allergen Reactions  . Olmesartan     enteritis  . Amlodipine Swelling and Rash     Current Outpatient Medications  Medication Sig Dispense Refill  . apixaban (ELIQUIS) 5 MG TABS tablet Take 1 tablet (5 mg total) by mouth 2 (two) times daily. 180 tablet 1  . carvedilol (COREG CR) 20 MG 24 hr capsule Take 1 capsule (20 mg total) by mouth daily. 90 capsule 3  . cloNIDine (CATAPRES) 0.1 MG tablet Take 0.05 mg by mouth 2 (two) times daily.    . ferrous sulfate 325 (65 FE) MG tablet Take 325 mg by mouth daily with breakfast.    . hydrALAZINE (APRESOLINE) 50 MG tablet Take 1 tablet (50 mg total) by mouth 3 (three) times daily. 270 tablet 3  . levothyroxine (SYNTHROID, LEVOTHROID) 150 MCG tablet Take 150 mcg by mouth daily before breakfast.   0  . mirtazapine (REMERON) 7.5 MG tablet Take 7.5 mg by mouth at bedtime.     . Multiple Vitamin (MULTIVITAMIN WITH MINERALS) TABS tablet Take 1 tablet by mouth daily.    . pantoprazole (PROTONIX) 40 MG tablet Take 40 mg by mouth daily.  3  . rosuvastatin (CRESTOR) 40 MG tablet TAKE 1 TABLET BY MOUTH DAILY 90 tablet 3  . spironolactone (ALDACTONE) 25 MG tablet Take 25 mg by mouth daily.    . vitamin C (ASCORBIC ACID) 250 MG tablet Take 250 mg by mouth daily.    Marland Kitchen VITAMIN D, CHOLECALCIFEROL, PO Take 1 tablet by mouth daily.     No current facility-administered medications for this visit.     Past Medical History:  Diagnosis Date  . Arthritis   . Atrial fibrillation (HCC)   . Diarrhea    since gallbladder removed  . 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 History of blood transfusion as a child    no abnormal reaction noted  . History of colon polyps   . Hyperlipidemia    takes Pravastatin daily  . Hypertension    takes Azor and Metoprolol daily  . Hypothyroidism    takes Synthroid daily  . Joint pain   . Presence of permanent cardiac pacemaker   . Urinary frequency   . Urinary urgency     ROS:   All systems reviewed and negative except as noted in the HPI.   Past Surgical History:  Procedure Laterality Date  . ABDOMINAL HYSTERECTOMY    . cataract surgery Bilateral   . CHOLECYSTECTOMY  1971  . COLONOSCOPY    . EYE SURGERY    . INSERT / REPLACE / REMOVE PACEMAKER     Medtronic PPM 11/01/14 (Dr. Lewayne Bunting)  . JOINT REPLACEMENT Right    knee  . LEFT HEART CATH AND CORONARY ANGIOGRAPHY N/A 11/01/2018   Procedure: LEFT HEART CATH AND CORONARY ANGIOGRAPHY;  Surgeon: Yvonne Kendall, MD;  Location: [facility];  Service: Cardiovascular;  Laterality: N/A;  . PERMANENT PACEMAKER INSERTION N/A 11/01/2014   Procedure: PERMANENT PACEMAKER INSERTION;  Surgeon: Marinus Maw, MD;  Location: [facility] CATH LAB;  Service: Cardiovascular;  Laterality: N/A;  .  stomach stapled  1985  . TOTAL KNEE ARTHROPLASTY Left 11/05/2015   Procedure: TOTAL KNEE ARTHROPLASTY;  Surgeon: Dannielle Huh, MD;  Location: [facility] OR;  Service: Orthopedics;  Laterality: Left;     Family History  Problem Relation Age of Onset  . Heart disease Father   . Heart attack Father   . Stroke Sister      Social History   Socioeconomic History  . Marital status: Married    Spouse name: Not on file  . Number of children: Not on file  . Years of education: Not on file  . Highest education level: Not on file  Occupational History  . Not on file  Tobacco Use  . Smoking status: Never Smoker  . Smokeless tobacco: Never Used  Vaping Use  . Vaping Use: Never used  Substance and Sexual Activity  . Alcohol use: Yes    Alcohol/week: 1.0 standard drink    Types: 1 Glasses of wine per week  . Drug use: No   . Sexual activity: Not Currently    Birth control/protection: Surgical  Other Topics Concern  . Not on file  Social History Narrative  . Not on file   Social Determinants of Health   Financial Resource Strain: Not on file  Food Insecurity: Not on file  Transportation Needs: Not on file  Physical Activity: Not on file  Stress: Not on file  Social Connections: Not on file  Intimate Partner Violence: Not on file     BP (!) 158/80   Pulse 69   Ht 5\' 5"  (1.651 m)   Wt 192 lb 12.8 oz (87.5 kg)   SpO2 99%   BMI 32.08 kg/m   Physical Exam:  Well appearing NAD HEENT: Unremarkable Neck:  No JVD, no thyromegally Lymphatics:  No adenopathy Back:  No CVA tenderness Lungs:  Clear HEART:  Regular rate rhythm, no murmurs, no rubs, no clicks Abd:  soft, positive bowel sounds, no organomegally, no rebound, no guarding Ext:  2 plus pulses, no edema, no cyanosis, no clubbing Skin:  No rashes no nodules Neuro:  CN II through XII intact, motor grossly intact   DEVICE  Normal device function.  See PaceArt for details.   Assess/Plan: 1. CHB- she is asymptomatic, s/p PPM insertion.  2. PPM - her medtronic DDD PM is working normally.  3. HTN - her bp remains elevated. She had to stop her ARB due to an allergy. 4. Obesity - she is encouraged to lose weight.  Lauren Weinmann,MD

## 2021-02-05 ENCOUNTER — Ambulatory Visit (INDEPENDENT_AMBULATORY_CARE_PROVIDER_SITE_OTHER): Payer: Medicare PPO

## 2021-02-05 DIAGNOSIS — I442 Atrioventricular block, complete: Secondary | ICD-10-CM

## 2021-02-06 LAB — CUP PACEART REMOTE DEVICE CHECK
Battery Impedance: 506 Ohm
Battery Remaining Longevity: 83 mo
Battery Voltage: 2.78 V
Brady Statistic AP VP Percent: 28 %
Brady Statistic AP VS Percent: 0 %
Brady Statistic AS VP Percent: 72 %
Brady Statistic AS VS Percent: 0 %
Date Time Interrogation Session: 20220215162333
Implantable Lead Implant Date: 20151111
Implantable Lead Implant Date: 20151111
Implantable Lead Location: 753859
Implantable Lead Location: 753860
Implantable Lead Model: 5076
Implantable Lead Model: 5076
Implantable Pulse Generator Implant Date: 20151111
Lead Channel Impedance Value: 417 Ohm
Lead Channel Impedance Value: 619 Ohm
Lead Channel Pacing Threshold Amplitude: 0.625 V
Lead Channel Pacing Threshold Amplitude: 1.625 V
Lead Channel Pacing Threshold Pulse Width: 0.4 ms
Lead Channel Pacing Threshold Pulse Width: 0.4 ms
Lead Channel Setting Pacing Amplitude: 1.5 V
Lead Channel Setting Pacing Amplitude: 2.5 V
Lead Channel Setting Pacing Pulse Width: 0.46 ms
Lead Channel Setting Sensing Sensitivity: 2 mV

## 2021-02-08 DIAGNOSIS — H35033 Hypertensive retinopathy, bilateral: Secondary | ICD-10-CM | POA: Diagnosis not present

## 2021-02-12 NOTE — Progress Notes (Signed)
Remote pacemaker transmission.   

## 2021-02-14 DIAGNOSIS — N184 Chronic kidney disease, stage 4 (severe): Secondary | ICD-10-CM | POA: Diagnosis not present

## 2021-02-14 DIAGNOSIS — E039 Hypothyroidism, unspecified: Secondary | ICD-10-CM | POA: Diagnosis not present

## 2021-02-14 DIAGNOSIS — E7800 Pure hypercholesterolemia, unspecified: Secondary | ICD-10-CM | POA: Diagnosis not present

## 2021-02-14 DIAGNOSIS — I480 Paroxysmal atrial fibrillation: Secondary | ICD-10-CM | POA: Diagnosis not present

## 2021-02-14 DIAGNOSIS — E669 Obesity, unspecified: Secondary | ICD-10-CM | POA: Diagnosis not present

## 2021-02-14 DIAGNOSIS — I1310 Hypertensive heart and chronic kidney disease without heart failure, with stage 1 through stage 4 chronic kidney disease, or unspecified chronic kidney disease: Secondary | ICD-10-CM | POA: Diagnosis not present

## 2021-02-14 DIAGNOSIS — I442 Atrioventricular block, complete: Secondary | ICD-10-CM | POA: Diagnosis not present

## 2021-02-14 DIAGNOSIS — Z7901 Long term (current) use of anticoagulants: Secondary | ICD-10-CM | POA: Diagnosis not present

## 2021-02-14 DIAGNOSIS — D692 Other nonthrombocytopenic purpura: Secondary | ICD-10-CM | POA: Diagnosis not present

## 2021-03-06 ENCOUNTER — Other Ambulatory Visit: Payer: Self-pay

## 2021-03-06 ENCOUNTER — Ambulatory Visit: Payer: Medicare PPO | Admitting: Cardiovascular Disease

## 2021-03-06 ENCOUNTER — Encounter: Payer: Self-pay | Admitting: Cardiovascular Disease

## 2021-03-06 VITALS — BP 132/74 | HR 78 | Ht 65.0 in | Wt 196.0 lb

## 2021-03-06 DIAGNOSIS — I442 Atrioventricular block, complete: Secondary | ICD-10-CM | POA: Diagnosis not present

## 2021-03-06 DIAGNOSIS — E039 Hypothyroidism, unspecified: Secondary | ICD-10-CM

## 2021-03-06 DIAGNOSIS — I10 Essential (primary) hypertension: Secondary | ICD-10-CM

## 2021-03-06 DIAGNOSIS — I2510 Atherosclerotic heart disease of native coronary artery without angina pectoris: Secondary | ICD-10-CM

## 2021-03-06 DIAGNOSIS — I480 Paroxysmal atrial fibrillation: Secondary | ICD-10-CM

## 2021-03-06 MED ORDER — HYDRALAZINE HCL 50 MG PO TABS: ORAL_TABLET | ORAL | 3 refills | Status: DC

## 2021-03-06 NOTE — Progress Notes (Signed)
Cardiology Office Note  Date:  03/06/2021   ID:  Lauren Henry, DOB December 31, 1940, MRN [id number]  PCP:  Gaspar Garbe, MD  Cardiologist:   Chilton Si, MD   No chief complaint on file.    History of Present Illness: Lauren Henry is a 80 y.o. female with complete heart block s/p PPM, paroxysmal atrial fibrillation, mild-moderate CAD, and hypertensionhere for follow up.Lauren Henry had a pacemaker implanted by Dr. Ladona Ridgel in 2015 due to complete heart block. She was minimally symptomatic and reports that it was incidentally found when she presented for knee surgery. Since that time she has been doing well. She continues to have difficult to manage hypertension. She last followed up with Dr. Ladona Ridgel on 10/2017 and her blood pressure was elevated. Carvedilol was switched to twice daily dosing from once daily long-acting dosing. In the past she also needed frequent doses of clonidine prn.  Lauren Henry had LE swelling and a rash that were thought to be due to amlodipine. This was discontinued and scheduled clonidine was increased. She was also referred for an echo 09/13/18 that revealed LVEF 45-50% with diffuse hypokinesis and severe mitral annular calcification. She was referred for a Lexiscan Myoview10/17/19 that revealed a reversible defect in the inferior, apical and inferolateral regions. This was associated with apical akinesis and inferoapical dyskinesis. This was thought to be either ischemia or a pacemaker artifact. Given that she was feeling well and has chronic kidney disease she elected medical management. Clonidine was increased due to poorly controlled blood pressure. She was admitted with hypertensive urgency. Her blood pressure was as high as the 240s at home. Troponin was mildly elevated and she underwent cardiac catheterization 10/2018 that revealed mild to moderate non-obstructive CAD. There was some concern about the proximal LAD. However FFR was  unremarkable.  Lauren Henry' blood pressure was labile.  She was also having several falls.  Clonidine was reduced.  She followed up with Lauren Shelter, PA-C and at that time her blood pressure was better-controlled.  She noted that her morning blood pressures were little bit low so she switched spironolactone to the mornings.  Since that time she was admitted with nausea and vomiting and acute renal failure due to intravascular volume depletion.  CT revealed diverticulosis.  Olmesartan was held due to concern for drug-induced enteritis. Carvedilol was reduced and clonidine was held.  Since being discharged she has been feeling better though she did pull a muscle in her neck.  Since then she has been feeling otherwise well.  Her blood pressure has been running mostly in the one forties.  She has been holding clonidine and spironolactone.  Carvedilol was reduced to 20 mg.  She is finally having formed stools and her appetite has been good.  She is otherwise without complaint.  She has not yet taken her medication today.  At her last appointment she had labile blood pressure.  Carvedilol was restarted and clonidine was increased.  At follow-up with her pharmacist her blood pressure was poorly controlled in the office but had been controlled at home.  Therefore her current regimen was continued.  Recently her blood pressure has been in the 120s to 130s in the mornings and then increases to 120s to 140s at night.  In general it has been much better controlled.  She has struggled with pain in her hips and is considering hip replacement.  Her breathing has been stable and she denies any lower extremity edema.  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  Her walking and exercise are mostly limited by hip pain.  Lately she has been struggling with glaucoma and vision changes.  She also reports family stressors.     Past Medical History:  Diagnosis Date  . Arthritis   . Atrial fibrillation (HCC)    . Diarrhea    since gallbladder removed  . 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 History of blood transfusion as a child   no abnormal reaction noted  . History of colon polyps   . Hyperlipidemia    takes Pravastatin daily  . Hypertension    takes Azor and Metoprolol daily  . Hypothyroidism    takes Synthroid daily  . Joint pain   . Presence of permanent cardiac pacemaker   . Urinary frequency   . Urinary urgency     Past Surgical History:  Procedure Laterality Date  . ABDOMINAL HYSTERECTOMY    . cataract surgery Bilateral   . CHOLECYSTECTOMY  1971  . COLONOSCOPY    . EYE SURGERY    . INSERT / REPLACE / REMOVE PACEMAKER     Medtronic PPM 11/01/14 (Dr. Lewayne Bunting)  . JOINT REPLACEMENT Right    knee  . LEFT HEART CATH AND CORONARY ANGIOGRAPHY N/A 11/01/2018   Procedure: LEFT HEART CATH AND CORONARY ANGIOGRAPHY;  Surgeon: Yvonne Kendall, MD;  Location: [facility];  Service: Cardiovascular;  Laterality: N/A;  . PERMANENT PACEMAKER INSERTION N/A 11/01/2014   Procedure: PERMANENT PACEMAKER INSERTION;  Surgeon: Marinus Maw, MD;  Location[facility] CATH LAB;  Service: Cardiovascular;  Laterality: N/A;  . stomach stapled  1985  . TOTAL KNEE ARTHROPLASTY Left 11/05/2015   Procedure: TOTAL KNEE ARTHROPLASTY;  Surgeon: Dannielle Huh, MD;  Location: [facility] OR;  Service: Orthopedics;  Laterality: Left;     Current Outpatient Medications  Medication Sig Dispense Refill  . apixaban (ELIQUIS) 5 MG TABS tablet Take 1 tablet (5 mg total) by mouth 2 (two) times daily. 180 tablet 1  . carvedilol (COREG CR) 20 MG 24 hr capsule Take 1 capsule (20 mg total) by mouth daily. 90 capsule 3  . cloNIDine (CATAPRES) 0.1 MG tablet Take 0.05 mg by mouth 2 (two) times daily.    . ferrous sulfate 325 (65 FE) MG tablet Take 325 mg by mouth daily with breakfast.    . levothyroxine (SYNTHROID, LEVOTHROID) 150 MCG tablet Take 150 mcg by mouth daily  before breakfast.   0  . mirtazapine (REMERON) 7.5 MG tablet Take 7.5 mg by mouth at bedtime.     . Multiple Vitamin (MULTIVITAMIN WITH MINERALS) TABS tablet Take 1 tablet by mouth daily.    . pantoprazole (PROTONIX) 40 MG tablet Take 40 mg by mouth daily.  3  . rosuvastatin (CRESTOR) 40 MG tablet TAKE 1 TABLET BY MOUTH DAILY 90 tablet 3  . spironolactone (ALDACTONE) 25 MG tablet Take 25 mg by mouth daily.    . vitamin C (ASCORBIC ACID) 250 MG tablet Take 250 mg by mouth daily.    Marland Kitchen VITAMIN D, CHOLECALCIFEROL, PO Take 1 tablet by mouth daily.    . hydrALAZINE (APRESOLINE) 50 MG tablet TAKE 1 TABLET MORNING AND EVENINGS AND 2 TABLETS IN THE AFTERNOON 360 tablet 3   No current facility-administered medications for this visit.    Allergies:   Olmesartan and Amlodipine    Social History:  The patient  reports that she has never smoked. She has never used smokeless tobacco. She reports current alcohol use of about 1.0 standard drink of alcohol per week. She reports that she does not use drugs.   Family History:  The patient's family history includes Heart attack in her father; Heart disease in her father; Stroke in her sister.    ROS:  Please see the history of present illness.   Otherwise, review of systems are positive for none.   All other systems are reviewed and negative.    PHYSICAL EXAM: VS:  BP 132/74   Pulse 78   Ht 5\' 5"  (1.651 m)   Wt 196 lb (88.9 kg)   SpO2 99%   BMI 32.62 kg/m  , BMI Body mass index is 32.62 kg/m. GENERAL:  Well appearing HEENT: Pupils equal round and reactive, fundi not visualized, oral mucosa unremarkable NECK:  No jugular venous distention, waveform within normal limits, carotid upstroke brisk and symmetric, no bruits LUNGS:  Clear to auscultation bilaterally HEART:  RRR.  PMI not displaced or sustained,S1 and S2 within normal limits, no S3, no S4, no clicks, no rubs, II/VI systolic murmur at LUSB ABD:  Flat, positive bowel sounds normal in frequency  in pitch, no bruits, no rebound, no guarding, no midline pulsatile mass, no hepatomegaly, no splenomegaly EXT:  2 plus pulses throughout, no edema, no cyanosis no clubbing SKIN:  No rashes no nodules NEURO:  Cranial nerves II through XII grossly intact, motor grossly intact throughout PSYCH:  Cognitively intact, oriented to person place and time   EKG:  EKG is not ordered today. The ekg ordered 09/09/18 demonstrates AS-VP.  Rate 71 bpm. 03/15/20: APVP.  Rate 63 bpm.     Echo 10/30/14: Study Conclusions  - Left ventricle: The cavity size was normal. There was mild focal basal hypertrophy of the septum. Systolic function was normal. The estimated ejection fraction was in the range of 60% to 65%. Wall motion was normal; there were no regional wall motion abnormalities. - Aortic valve: Trileaflet; mildly thickened, mildly calcified leaflets. Transvalvular velocity was minimally increased. There was no stenosis. - Mitral valve: Calcified annulus. There was mild regurgitation. - Right ventricle: The cavity size was mildly dilated. Wall thickness was normal.  Echo 09/13/18: Study Conclusions  - Left ventricle: The cavity size was normal. There was mild focal basal hypertrophy of the septum. Systolic function was mildly reduced. The estimated ejection fraction was in the range of 45% to 50%. Diffuse hypokinesis. Doppler parameters are consistent with both elevated ventricular end-diastolic filling pressure and elevated left atrial filling pressure. - Mitral valve: Severely calcified annulus. Mildly thickened, mildly calcified leaflets . - Left atrium: The atrium was mildly dilated. - Atrial septum: No defect or patent foramen ovale was identified.  Lexiscan Myoview10/17/19:   The left ventricular ejection fraction is mildly decreased (45-54%).  Nuclear stress EF: 54%.  No T wave inversion was noted during stress.  There was no ST segment deviation  noted during stress.  Defect 1: There is a large defect of moderate severity.  Large size, moderate severity partially reversible inferior, apical and inferolateral perfusion defect, suggestive of ischemia. This could also represent pacemaker-related artifact. There is apical akinesis and inferoapical dyskinesis. LVEF 54%. This is an intermediate risk study. Clinical correlation is advised.  LHC 10/2018: Conclusions: 1. Mild to moderate, non-obstructive CAD. Most severe lesion is a 50-60% stenosis in the proximal LCx that is not hemodynamically significant (DFR 1.0). I suspect mild troponin elevation reflects  supply-demand mismatch in the setting of uncontrolled hypertension and renal insufficiency. 2. Normal left ventricular filling pressure.  Recommendations: 1. Medical therapy and risk factor modification to prevent progression of disease. 2. Consider noninvasive evaluation for renal artery stenosis (if not already performed as an outpatient), given hypertension and renal insufficiency.  Recent Labs: 08/23/2020: ALT 25 08/25/2020: TSH 4.159 08/27/2020: Hemoglobin 9.8; Magnesium 1.6; Platelets 146 08/28/2020: BUN 11; Creatinine, Ser 1.56; Potassium 4.1; Sodium 139    Lipid Panel    Component Value Date/Time   CHOL 122 10/30/2018 1852   TRIG 51 10/30/2018 1852   HDL 52 10/30/2018 1852   CHOLHDL 2.3 10/30/2018 1852   VLDL 10 10/30/2018 1852   LDLCALC 60 10/30/2018 1852   07/22/2019: Total cholesterol 127, triglycerides 80, HDL 42, LDL 69  Wt Readings from Last 3 Encounters:  03/06/21 196 lb (88.9 kg)  01/23/21 192 lb 12.8 oz (87.5 kg)  12/10/20 188 lb 9.6 oz (85.5 kg)      ASSESSMENT AND PLAN:  # PAF:   Rate is stable.  Her heart rate is regular on exam.  Continue carvedilol and Eliquis.  # Hypertension:  Blood pressure has been much better controlled but seems to be of running a little higher in the evenings.  She will increase her afternoon dose of hydralazine to 100  mg.  Continue morning and evening doses at 50 mg.  Continue carvedilol, clonidine, and spironolactone.  # Non-obstructive CAD: # Hyperlipidemia: Left heart cath 10/2018 showed nonobstructive disease. LDL was 72 on 08/2020.  Continue carvedilol and rosuvastatin.  She is not on aspirin given that she is on Eliquis.  # CHB s/p PPM:  Currently ASVP.Managed by Dr. Ladona Ridgel.   # Pre-operative risk assessment:  She is considering having surgery on her hip.  Given her coronary disease we will get a Lexiscan Myoview if she does decide to pursue surgery.  Given that this potential surgery date is unclear we will not get it at this time.    Current medicines are reviewed at length with the patient today.  The patient does not have concerns regarding medicines.  The following changes have been made: increase afternoon hydralazine to 100mg   Labs/ tests ordered today include:   No orders of the defined types were placed in this encounter.    Disposition:   FU with Kiylee Thoreson C. , MD, [facility] in 6 months.  Raquel in 3 months.    Signed, Leida Luton C. [facility], MD, [facility]  03/06/2021 12:01 PM    [facility]

## 2021-03-06 NOTE — Patient Instructions (Addendum)
Medication Instructions:  INCREASE YOUR AFTERNOON DOSE OF HYDRALAZINE TO 100 MG, CONTINUE OTHERS AS THEY ARE  *If you need a refill on your cardiac medications before your next appointment, please call your pharmacy*  Lab Work: NONE   Testing/Procedures: NONE  Follow-Up: At [facility], you and your health needs are our priority.  As part of our continuing mission to provide you with exceptional heart care, we have created designated Provider Care Teams.  These Care Teams include your primary Cardiologist (physician) and Advanced Practice Providers (APPs -  Physician Assistants and Nurse Practitioners) who all work together to provide you with the care you need, when you need it.  We recommend signing up for the patient portal called "MyChart".  Sign up information is provided on this After Visit Summary.  MyChart is used to connect with patients for Virtual Visits (Telemedicine).  Patients are able to view lab/test results, encounter notes, upcoming appointments, etc.  Non-urgent messages can be sent to your provider as well.   To learn more about what you can do with MyChart, go to [email or web address].    Your next appointment:   6 month(s)  The format for your next appointment:   In Person  Provider:   Chilton Si, MD  Your physician recommends that you schedule a follow-up appointment in: PHARM D IN 3 MONTHS

## 2021-03-08 DIAGNOSIS — H34832 Tributary (branch) retinal vein occlusion, left eye, with macular edema: Secondary | ICD-10-CM | POA: Diagnosis not present

## 2021-03-08 DIAGNOSIS — H43813 Vitreous degeneration, bilateral: Secondary | ICD-10-CM | POA: Diagnosis not present

## 2021-03-08 DIAGNOSIS — H353133 Nonexudative age-related macular degeneration, bilateral, advanced atrophic without subfoveal involvement: Secondary | ICD-10-CM | POA: Diagnosis not present

## 2021-03-08 DIAGNOSIS — H35033 Hypertensive retinopathy, bilateral: Secondary | ICD-10-CM | POA: Diagnosis not present

## 2021-03-20 ENCOUNTER — Other Ambulatory Visit: Payer: Self-pay

## 2021-03-24 NOTE — Telephone Encounter (Signed)
Patient seen in office 3/16

## 2021-04-10 ENCOUNTER — Telehealth: Payer: Self-pay | Admitting: Cardiology

## 2021-04-10 NOTE — Telephone Encounter (Signed)
This is an established Dr. Duke Salvia pt. Will forward this message back to the creator of this phone note, Scarlette Calico, to obtain correct information as to what this pt is requesting, and to route this to Dr. Duke Salvia and Nurse for further review and follow-up.

## 2021-04-10 NOTE — Telephone Encounter (Signed)
Due to my part time status and limited office time  I am not currently accepting intra- practice transfers.   It appears that the patient actually has been seeing Dr. Duke Salvia at the [facility] office, not Dr. Shari Prows.  Did the patient actually want to transfer to Dr. Shari Prows from Dr. Duke Salvia at the [facility] location?

## 2021-04-10 NOTE — Telephone Encounter (Signed)
Pt is requesting a provider switch from Dr. Shari Prows to Dr. Elease Hashimoto. Pt states the new [address] office is too far from her home.

## 2021-04-10 NOTE — Telephone Encounter (Signed)
Fine with me

## 2021-04-15 NOTE — Telephone Encounter (Signed)
Left message to call back to discuss further  

## 2021-04-25 NOTE — Telephone Encounter (Signed)
Okay with me.  Thank you °

## 2021-04-25 NOTE — Telephone Encounter (Signed)
Spoke with patient and she would like to change from Dr Duke Salvia to Dr Shari Prows secondary to the move to [facility]. Dr Duke Salvia ok with the switch, will forward to Dr Shari Prows for review

## 2021-05-07 ENCOUNTER — Ambulatory Visit (INDEPENDENT_AMBULATORY_CARE_PROVIDER_SITE_OTHER): Payer: Medicare PPO

## 2021-05-07 DIAGNOSIS — I442 Atrioventricular block, complete: Secondary | ICD-10-CM | POA: Diagnosis not present

## 2021-05-07 LAB — CUP PACEART REMOTE DEVICE CHECK
Battery Impedance: 557 Ohm
Battery Remaining Longevity: 80 mo
Battery Voltage: 2.78 V
Brady Statistic AP VP Percent: 28 %
Brady Statistic AP VS Percent: 0 %
Brady Statistic AS VP Percent: 72 %
Brady Statistic AS VS Percent: 0 %
Date Time Interrogation Session: 20220517102339
Implantable Lead Implant Date: 20151111
Implantable Lead Implant Date: 20151111
Implantable Lead Location: 753859
Implantable Lead Location: 753860
Implantable Lead Model: 5076
Implantable Lead Model: 5076
Implantable Pulse Generator Implant Date: 20151111
Lead Channel Impedance Value: 460 Ohm
Lead Channel Impedance Value: 630 Ohm
Lead Channel Pacing Threshold Amplitude: 0.625 V
Lead Channel Pacing Threshold Amplitude: 1.5 V
Lead Channel Pacing Threshold Pulse Width: 0.4 ms
Lead Channel Pacing Threshold Pulse Width: 0.4 ms
Lead Channel Setting Pacing Amplitude: 1.5 V
Lead Channel Setting Pacing Amplitude: 2.5 V
Lead Channel Setting Pacing Pulse Width: 0.46 ms
Lead Channel Setting Sensing Sensitivity: 2 mV

## 2021-05-10 DIAGNOSIS — H40052 Ocular hypertension, left eye: Secondary | ICD-10-CM | POA: Diagnosis not present

## 2021-05-10 DIAGNOSIS — H40022 Open angle with borderline findings, high risk, left eye: Secondary | ICD-10-CM | POA: Diagnosis not present

## 2021-05-21 ENCOUNTER — Telehealth: Payer: Self-pay

## 2021-05-21 NOTE — Telephone Encounter (Signed)
LMOM FOR LABS NEEDED

## 2021-05-30 NOTE — Progress Notes (Signed)
Remote pacemaker transmission.   

## 2021-06-06 ENCOUNTER — Ambulatory Visit: Payer: Medicare PPO

## 2021-06-06 ENCOUNTER — Other Ambulatory Visit: Payer: Self-pay | Admitting: Cardiovascular Disease

## 2021-06-06 ENCOUNTER — Other Ambulatory Visit: Payer: Self-pay

## 2021-06-06 DIAGNOSIS — M65341 Trigger finger, right ring finger: Secondary | ICD-10-CM | POA: Diagnosis not present

## 2021-06-06 DIAGNOSIS — E7800 Pure hypercholesterolemia, unspecified: Secondary | ICD-10-CM

## 2021-06-06 DIAGNOSIS — I10 Essential (primary) hypertension: Secondary | ICD-10-CM | POA: Diagnosis not present

## 2021-06-06 DIAGNOSIS — I480 Paroxysmal atrial fibrillation: Secondary | ICD-10-CM | POA: Diagnosis not present

## 2021-06-06 LAB — COMPREHENSIVE METABOLIC PANEL
ALT: 18 IU/L (ref 0–32)
AST: 17 IU/L (ref 0–40)
Albumin/Globulin Ratio: 2 (ref 1.2–2.2)
Albumin: 4.6 g/dL (ref 3.7–4.7)
Alkaline Phosphatase: 79 IU/L (ref 44–121)
BUN/Creatinine Ratio: 19 (ref 12–28)
BUN: 30 mg/dL — ABNORMAL HIGH (ref 8–27)
Bilirubin Total: 0.6 mg/dL (ref 0.0–1.2)
CO2: 20 mmol/L (ref 20–29)
Calcium: 9.9 mg/dL (ref 8.7–10.3)
Chloride: 100 mmol/L (ref 96–106)
Creatinine, Ser: 1.6 mg/dL — ABNORMAL HIGH (ref 0.57–1.00)
Globulin, Total: 2.3 g/dL (ref 1.5–4.5)
Glucose: 116 mg/dL — ABNORMAL HIGH (ref 65–99)
Potassium: 4.5 mmol/L (ref 3.5–5.2)
Sodium: 137 mmol/L (ref 134–144)
Total Protein: 6.9 g/dL (ref 6.0–8.5)
eGFR: 33 mL/min/{1.73_m2} — ABNORMAL LOW (ref >59)

## 2021-06-06 LAB — CBC WITH DIFFERENTIAL/PLATELET
Basophils Absolute: 0.1 10*3/uL (ref 0.0–0.2)
Basos: 1 %
EOS (ABSOLUTE): 0.1 10*3/uL (ref 0.0–0.4)
Eos: 2 %
Hematocrit: 32.2 % — ABNORMAL LOW (ref 34.0–46.6)
Hemoglobin: 11 g/dL — ABNORMAL LOW (ref 11.1–15.9)
Immature Grans (Abs): 0 10*3/uL (ref 0.0–0.1)
Immature Granulocytes: 0 %
Lymphocytes Absolute: 0.6 10*3/uL — ABNORMAL LOW (ref 0.7–3.1)
Lymphs: 11 %
MCH: 31.1 pg (ref 26.6–33.0)
MCHC: 34.2 g/dL (ref 31.5–35.7)
MCV: 91 fL (ref 79–97)
Monocytes Absolute: 0.6 10*3/uL (ref 0.1–0.9)
Monocytes: 11 %
Neutrophils Absolute: 3.7 10*3/uL (ref 1.4–7.0)
Neutrophils: 75 %
Platelets: 157 10*3/uL (ref 150–450)
RBC: 3.54 x10E6/uL — ABNORMAL LOW (ref 3.77–5.28)
RDW: 12.4 % (ref 11.7–15.4)
WBC: 5 10*3/uL (ref 3.4–10.8)

## 2021-06-06 LAB — LIPID PANEL
Chol/HDL Ratio: 2.9 ratio (ref 0.0–4.4)
Cholesterol, Total: 150 mg/dL (ref 100–199)
HDL: 51 mg/dL (ref >39)
LDL Chol Calc (NIH): 85 mg/dL (ref 0–99)
Triglycerides: 71 mg/dL (ref 0–149)
VLDL Cholesterol Cal: 14 mg/dL (ref 5–40)

## 2021-06-06 NOTE — Progress Notes (Deleted)
06/06/2021 Lauren Henry February 14, 1941 [id number]   HPI:  Lauren Henry is a 80 y.o. female patient of Dr Duke Salvia, with a PMH below who presents today for hypertension clinic follow up.   She has had problems with labile hypertension for several years and  has been followed by the hypertension clinic for close to a year.  She developed enteritis after starting olmesartan and a rash from amlodipine.   Her last visit was 3 months ago with Dr. Duke Salvia, at which time her pressure was controlled at 132/74, although it was noted she had elevated evening readings.   She was asked to increase her mid-day dose of hydralazine from 50 to 100 mg and return in 3 months.    Today she returns for follow up.      Past Medical History: Heart block Complete, pacemaker placed 2015  hyperlipidemia Last LDL (2019) at 60 - on rosuvastatin 40 mg  Paroxysmal AF CHADS2-VASc score - 5 (htn, age x 2 female, cad) - on Eliquis  CAD Non-obstructive  CKD Stage 3, GFR at 57     Blood Pressure Goal:  130/80  Current Medications: carvedilol CR 20 mg qd (afternoon); hydralzine 50 mg am, hs, 100 mg mid-day, spironolactone 25 mg qd (am), clonidine 0.1 mg bid (afternoon and hs)  Family Hx: father with heart disease and MI, sister with stroke  Social Hx: no tobacco; rare alcohol, occasional coffee  Diet: eating better since enteritis episode - mostly home cooked, no fried, lots of vegetables (fresh, frozen and canned)  Exercise:  Previous knee replacements, notes that her knees still stiffen up in cold weather; 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,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), so stays active   Home BP readings:   Intolerances: olmesartan - enteritis;  amlodipine - edema  Labs: 9/21:  Na 139, K 4.1, Glu 104, BUN 11, SCr 1.56 GFR 31  Wt Readings from Last 3 Encounters:  03/06/21 196 lb (88.9 kg)  01/23/21 192 lb 12.8 oz (87.5 kg)  12/10/20 188 lb 9.6 oz (85.5 kg)   BP Readings from Last 3 Encounters:  03/06/21  132/74  01/23/21 (!) 158/80  12/10/20 (!) 160/78   Pulse Readings from Last 3 Encounters:  03/06/21 78  01/23/21 69  12/10/20 81    Current Outpatient Medications  Medication Sig Dispense Refill   apixaban (ELIQUIS) 5 MG TABS tablet Take 1 tablet (5 mg total) by mouth 2 (two) times daily. 180 tablet 1   carvedilol (COREG CR) 20 MG 24 hr capsule Take 1 capsule (20 mg total) by mouth daily. 90 capsule 3   cloNIDine (CATAPRES) 0.1 MG tablet Take 0.05 mg by mouth 2 (two) times daily.     ferrous sulfate 325 (65 FE) MG tablet Take 325 mg by mouth daily with breakfast.     hydrALAZINE (APRESOLINE) 50 MG tablet TAKE 1 TABLET MORNING AND EVENINGS AND 2 TABLETS IN THE AFTERNOON 360 tablet 3   levothyroxine (SYNTHROID, LEVOTHROID) 150 MCG tablet Take 150 mcg by mouth daily before breakfast.   0   mirtazapine (REMERON) 7.5 MG tablet Take 7.5 mg by mouth at bedtime.      Multiple Vitamin (MULTIVITAMIN WITH MINERALS) TABS tablet Take 1 tablet by mouth daily.     pantoprazole (PROTONIX) 40 MG tablet Take 40 mg by mouth daily.  3   rosuvastatin (CRESTOR) 40 MG tablet TAKE 1 TABLET BY MOUTH DAILY 90 tablet 3   spironolactone (ALDACTONE) 25 MG tablet Take  25 mg by mouth daily.     vitamin C (ASCORBIC ACID) 250 MG tablet Take 250 mg by mouth daily.     VITAMIN D, CHOLECALCIFEROL, PO Take 1 tablet by mouth daily.     No current facility-administered medications for this visit.    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  No problem-specific Assessment & Plan notes found for this encounter.   Phillips Hay PharmD CPP [facility] [address] [place], [place] [address] [phone]

## 2021-06-11 ENCOUNTER — Ambulatory Visit (INDEPENDENT_AMBULATORY_CARE_PROVIDER_SITE_OTHER): Payer: Medicare PPO | Admitting: Pharmacist Clinician (PhC)/ Clinical Pharmacy Specialist

## 2021-06-11 ENCOUNTER — Other Ambulatory Visit: Payer: Self-pay

## 2021-06-11 ENCOUNTER — Telehealth: Payer: Self-pay

## 2021-06-11 DIAGNOSIS — I10 Essential (primary) hypertension: Secondary | ICD-10-CM

## 2021-06-11 NOTE — Telephone Encounter (Signed)
LMOM TO R/S MISSED

## 2021-06-11 NOTE — Assessment & Plan Note (Signed)
Patient with essential hypertension, doing well with current medication regimen.  She has medications spaced into three times per day, and this seems to work well for her.  Office reading elevated, however all home readings look fine.  She would like to discuss switching to another MD, as it would be difficult for her to drive to the new [facility] location to continue seeing Dr. Duke Salvia.  We will send a request for her to switch to Dr. Swazil[place] and she will need to meet him sometime this fall.  She is aware to continue with home BP monitoring a few days each week, and reach out to [place] if she sees her systolic pressure trending > 140.

## 2021-06-11 NOTE — Progress Notes (Signed)
06/11/2021 Lauren Henry April 11, 1941 [id number]   HPI:  Lauren Henry is a 80 y.o. female patient of Dr Lauren Henry, with a PMH below who presents today for hypertension clinic evaluation.   She has had problems with labile hypertension for several years and at her last visit was noted to be 148/82.  She was hospitalized twice in late August/early September due to olmesartan induced enteritis.  She was severely dehydrated during this episode and SCr peaked at 3.2.  By second discharge was back down to baseline, at 1.56.    She is feeling much better now, with no recent GI issues.  No other concerns with her medications today and states no problems with compliance.  There was some confusion about her office visit, she states she was called to remind her about a lab appointment last week.  She came in for that, but was unaware she was actually scheduled for an office visit that day.  Today she notes that she has been feeling well and has no complaints about her home blood pressure readings or medications.    Past Medical History: Heart block Complete, pacemaker placed 2015  hyperlipidemia Last LDL (2019) at 60 - on rosuvastatin 40 mg  Paroxysmal AF CHADS2-VASc score - 5 (htn, age x 2 female, cad) - on Eliquis  CAD Non-obstructive  CKD Stage 3, GFR at 38     Blood Pressure Goal:  130/80  Current Medications: carvedilol CR 20 mg qd (afternoon); hydralzine 50 mg bid, 100 mg mid-day; spironolactone 25 mg qd (am), clonidine 0.05 mg bid (afternoon and hs)  Family Hx: father with heart disease and MI, sister with stroke  Social Hx: no tobacco; rare alcohol, occasional coffee  Diet: eating better since enteritis episode - mostly home cooked, no fried, lots of vegetables (fresh, frozen and canned)  Exercise:  Previous knee replacements, notes that her knees still stiffen up in cold weather; 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  No readings > 126 systolic.     Intolerances: olmesartan - enteritis; amlodipine - edema  Labs:  6/22:  Na 137, K 4.5, Glu 116, BUN 30, SCr 1.60, GFR 33 9/21:  Na 139, K 4.1, Glu 104, BUN 11, SCr 1.56, GFR 31  Wt Readings from Last 3 Encounters:  06/11/21 195 lb 9.6 oz (88.7 kg)  03/06/21 196 lb (88.9 kg)  01/23/21 192 lb 12.8 oz (87.5 kg)   BP Readings from Last 3 Encounters:  06/11/21 (!) 164/80  03/06/21 132/74  01/23/21 (!) 158/80   Pulse Readings from Last 3 Encounters:  06/11/21 77  03/06/21 78  01/23/21 69    Current Outpatient Medications  Medication Sig Dispense Refill   apixaban (ELIQUIS) 5 MG TABS tablet Take 1 tablet (5 mg total) by mouth 2 (two) times daily. 180 tablet 1   carvedilol (COREG CR) 20 MG 24 hr capsule Take 1 capsule (20 mg total) by mouth daily. 90 capsule 3   cloNIDine (CATAPRES) 0.1 MG tablet Take 0.05 mg by mouth 2 (two) times daily.     ferrous sulfate 325 (65 FE) MG tablet Take 325 mg by mouth daily with breakfast.     hydrALAZINE (APRESOLINE) 50 MG tablet TAKE 1 TABLET MORNING AND EVENINGS AND 2 TABLETS IN THE AFTERNOON 360 tablet 3   levothyroxine (SYNTHROID, LEVOTHROID) 150 MCG tablet Take  150 mcg by mouth daily before breakfast.   0   mirtazapine (REMERON) 7.5 MG tablet Take 7.5 mg by mouth at bedtime.      Multiple Vitamin (MULTIVITAMIN WITH MINERALS) TABS tablet Take 1 tablet by mouth daily.     pantoprazole (PROTONIX) 40 MG tablet Take 40 mg by mouth daily.  3   rosuvastatin (CRESTOR) 40 MG tablet TAKE 1 TABLET BY MOUTH DAILY 90 tablet 3   spironolactone (ALDACTONE) 25 MG tablet Take 25 mg by mouth daily.     vitamin C (ASCORBIC ACID) 250 MG tablet Take 250 mg by mouth daily.     VITAMIN D, CHOLECALCIFEROL, PO Take 1 tablet by mouth daily.     No current facility-administered medications for this visit.    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 rate 17, height 5\' 5"  (1.651 m), weight 195 lb 9.6 oz (88.7 kg), SpO2 98 %.  Essential hypertension Patient with essential hypertension, doing well with current medication regimen.  She has medications spaced into three times per day, and this seems to work well for her.  Office reading elevated, however all home readings look fine.  She would like to discuss switching to another MD, as it would be difficult for her to drive to the new [facility] location to continue seeing Dr. .  We will send a request for her to switch to Dr. Duke Henry and she will need to meet him sometime this fall.  She is aware to continue with home BP monitoring a few days each week, and reach out to Lauren Henry if she sees her systolic pressure trending > 140.     [place] PharmD CPP [facility] [address] [place], [place] [id number]

## 2021-06-11 NOTE — Patient Instructions (Signed)
I will have Juliette Alcide (Dr. Leonides Sake nurse) make the switch to have you see Dr. Swaz[place]and in the future.  Once she gets that approved, the schedulers will set up an appointment with him for this fall.  If you notice that your home BP readings are becoming elevated (> 140 more than not), please reach out to [place].  Phillips Hay at [phone]  Take your BP meds as follows:  Continue with all current medications.    Bring all of your meds, your BP cuff and your record of home blood pressures to your next appointment.  Exercise as you're able, try to walk approximately 30 minutes per day.  Keep salt intake to a minimum, especially watch canned and prepared boxed foods.  Eat more fresh fruits and vegetables and fewer canned items.  Avoid eating in fast food restaurants.    HOW TO TAKE YOUR BLOOD PRESSURE: Rest 5 minutes before taking your blood pressure.  Don't smoke or drink caffeinated beverages for at least 30 minutes before. Take your blood pressure before (not after) you eat. Sit comfortably with your back supported and both feet on the floor (don't cross your legs). Elevate your arm to heart level on a table or a desk. Use the proper sized cuff. It should fit smoothly and snugly around your bare upper arm. There should be enough room to slip a fingertip under the cuff. The bottom edge of the cuff should be 1 inch above the crease of the elbow. Ideally, take 3 measurements at one sitting and record the average.

## 2021-07-04 DIAGNOSIS — H43813 Vitreous degeneration, bilateral: Secondary | ICD-10-CM | POA: Diagnosis not present

## 2021-07-04 DIAGNOSIS — H34832 Tributary (branch) retinal vein occlusion, left eye, with macular edema: Secondary | ICD-10-CM | POA: Diagnosis not present

## 2021-07-04 DIAGNOSIS — H353133 Nonexudative age-related macular degeneration, bilateral, advanced atrophic without subfoveal involvement: Secondary | ICD-10-CM | POA: Diagnosis not present

## 2021-07-04 DIAGNOSIS — H35033 Hypertensive retinopathy, bilateral: Secondary | ICD-10-CM | POA: Diagnosis not present

## 2021-07-05 ENCOUNTER — Telehealth (HOSPITAL_BASED_OUTPATIENT_CLINIC_OR_DEPARTMENT_OTHER): Payer: Self-pay | Admitting: *Deleted

## 2021-07-05 NOTE — Telephone Encounter (Addendum)
-----   Message from Rosalee Kaufman, RPH-CPP sent at 06/11/2021  3:36 PM EDT ----- Regarding: MD switch Hi  [name] doesn't want to drive to the new [facility] site from [facility].  We talked about her options and she'd like to try Dr. Swaziland.  Can you get that switch approved and have scheduling get her an appointment with him in the fall?  Let me know if I can do anything to help with this.  Thank you! Belenda Cruise    Will forward to Dr Swaziland and Dr Duke Salvia for review

## 2021-07-14 ENCOUNTER — Other Ambulatory Visit: Payer: Self-pay | Admitting: Cardiovascular Disease

## 2021-07-15 NOTE — Telephone Encounter (Signed)
Thanks Theron Arista.  She is stable.  December should be fine.  ----- Message -----  From: [place], Peter M, MD  Sent: 07/06/2021   7:36 PM EDT  To: Chilton Si, MD, Burnell Blanks, LPN    Ok but probably don't have opening til Dec.    Peter Swaziland MD, [facility]   Message sent to schedulers to arrange

## 2021-08-06 ENCOUNTER — Ambulatory Visit (INDEPENDENT_AMBULATORY_CARE_PROVIDER_SITE_OTHER): Payer: Medicare PPO

## 2021-08-06 DIAGNOSIS — I442 Atrioventricular block, complete: Secondary | ICD-10-CM

## 2021-08-06 LAB — CUP PACEART REMOTE DEVICE CHECK
Battery Impedance: 633 Ohm
Battery Remaining Longevity: 75 mo
Battery Voltage: 2.78 V
Brady Statistic AP VP Percent: 27 %
Brady Statistic AP VS Percent: 0 %
Brady Statistic AS VP Percent: 73 %
Brady Statistic AS VS Percent: 0 %
Date Time Interrogation Session: 20220816094919
Implantable Lead Implant Date: 20151111
Implantable Lead Implant Date: 20151111
Implantable Lead Location: 753859
Implantable Lead Location: 753860
Implantable Lead Model: 5076
Implantable Lead Model: 5076
Implantable Pulse Generator Implant Date: 20151111
Lead Channel Impedance Value: 401 Ohm
Lead Channel Impedance Value: 632 Ohm
Lead Channel Pacing Threshold Amplitude: 0.5 V
Lead Channel Pacing Threshold Amplitude: 1.5 V
Lead Channel Pacing Threshold Pulse Width: 0.4 ms
Lead Channel Pacing Threshold Pulse Width: 0.4 ms
Lead Channel Setting Pacing Amplitude: 1.5 V
Lead Channel Setting Pacing Amplitude: 2.5 V
Lead Channel Setting Pacing Pulse Width: 0.46 ms
Lead Channel Setting Sensing Sensitivity: 2 mV

## 2021-08-27 NOTE — Progress Notes (Signed)
Remote pacemaker transmission.   

## 2021-08-28 DIAGNOSIS — E559 Vitamin D deficiency, unspecified: Secondary | ICD-10-CM | POA: Diagnosis not present

## 2021-08-28 DIAGNOSIS — E039 Hypothyroidism, unspecified: Secondary | ICD-10-CM | POA: Diagnosis not present

## 2021-08-28 DIAGNOSIS — E7800 Pure hypercholesterolemia, unspecified: Secondary | ICD-10-CM | POA: Diagnosis not present

## 2021-09-02 DIAGNOSIS — Z7901 Long term (current) use of anticoagulants: Secondary | ICD-10-CM | POA: Diagnosis not present

## 2021-09-02 DIAGNOSIS — E7800 Pure hypercholesterolemia, unspecified: Secondary | ICD-10-CM | POA: Diagnosis not present

## 2021-09-02 DIAGNOSIS — E039 Hypothyroidism, unspecified: Secondary | ICD-10-CM | POA: Diagnosis not present

## 2021-09-02 DIAGNOSIS — Z1331 Encounter for screening for depression: Secondary | ICD-10-CM | POA: Diagnosis not present

## 2021-09-02 DIAGNOSIS — I1310 Hypertensive heart and chronic kidney disease without heart failure, with stage 1 through stage 4 chronic kidney disease, or unspecified chronic kidney disease: Secondary | ICD-10-CM | POA: Diagnosis not present

## 2021-09-02 DIAGNOSIS — R82998 Other abnormal findings in urine: Secondary | ICD-10-CM | POA: Diagnosis not present

## 2021-09-02 DIAGNOSIS — Z0000 Encounter for general adult medical examination without abnormal findings: Secondary | ICD-10-CM | POA: Diagnosis not present

## 2021-09-02 DIAGNOSIS — D631 Anemia in chronic kidney disease: Secondary | ICD-10-CM | POA: Diagnosis not present

## 2021-09-02 DIAGNOSIS — D692 Other nonthrombocytopenic purpura: Secondary | ICD-10-CM | POA: Diagnosis not present

## 2021-09-02 DIAGNOSIS — Z1389 Encounter for screening for other disorder: Secondary | ICD-10-CM | POA: Diagnosis not present

## 2021-09-02 DIAGNOSIS — N184 Chronic kidney disease, stage 4 (severe): Secondary | ICD-10-CM | POA: Diagnosis not present

## 2021-09-02 DIAGNOSIS — I480 Paroxysmal atrial fibrillation: Secondary | ICD-10-CM | POA: Diagnosis not present

## 2021-09-04 DIAGNOSIS — Z1212 Encounter for screening for malignant neoplasm of rectum: Secondary | ICD-10-CM | POA: Diagnosis not present

## 2021-09-12 ENCOUNTER — Ambulatory Visit (HOSPITAL_BASED_OUTPATIENT_CLINIC_OR_DEPARTMENT_OTHER): Payer: Medicare PPO | Admitting: Cardiovascular Disease

## 2021-10-12 DIAGNOSIS — Z23 Encounter for immunization: Secondary | ICD-10-CM | POA: Diagnosis not present

## 2021-10-16 ENCOUNTER — Other Ambulatory Visit: Payer: Self-pay | Admitting: Cardiovascular Disease

## 2021-11-01 ENCOUNTER — Other Ambulatory Visit: Payer: Self-pay

## 2021-11-01 ENCOUNTER — Ambulatory Visit (HOSPITAL_COMMUNITY)
Admission: RE | Admit: 2021-11-01 | Discharge: 2021-11-01 | Disposition: A | Discharge status: Home / Self Care | Payer: Medicare PPO | Source: Ambulatory Visit | Attending: Cardiology | Admitting: Cardiology

## 2021-11-01 DIAGNOSIS — I6523 Occlusion and stenosis of bilateral carotid arteries: Secondary | ICD-10-CM | POA: Insufficient documentation

## 2021-11-05 ENCOUNTER — Ambulatory Visit (INDEPENDENT_AMBULATORY_CARE_PROVIDER_SITE_OTHER): Payer: Medicare PPO

## 2021-11-05 DIAGNOSIS — I442 Atrioventricular block, complete: Secondary | ICD-10-CM

## 2021-11-07 LAB — CUP PACEART REMOTE DEVICE CHECK
Battery Impedance: 709 Ohm
Battery Remaining Longevity: 71 mo
Battery Voltage: 2.78 V
Brady Statistic AP VP Percent: 25 %
Brady Statistic AP VS Percent: 0 %
Brady Statistic AS VP Percent: 75 %
Brady Statistic AS VS Percent: 0 %
Date Time Interrogation Session: 20221117083149
Implantable Lead Implant Date: 20151111
Implantable Lead Implant Date: 20151111
Implantable Lead Location: 753859
Implantable Lead Location: 753860
Implantable Lead Model: 5076
Implantable Lead Model: 5076
Implantable Pulse Generator Implant Date: 20151111
Lead Channel Impedance Value: 461 Ohm
Lead Channel Impedance Value: 626 Ohm
Lead Channel Pacing Threshold Amplitude: 0.625 V
Lead Channel Pacing Threshold Amplitude: 1.375 V
Lead Channel Pacing Threshold Pulse Width: 0.4 ms
Lead Channel Pacing Threshold Pulse Width: 0.4 ms
Lead Channel Setting Pacing Amplitude: 1.5 V
Lead Channel Setting Pacing Amplitude: 2.5 V
Lead Channel Setting Pacing Pulse Width: 0.46 ms
Lead Channel Setting Sensing Sensitivity: 2 mV

## 2021-11-13 NOTE — Progress Notes (Signed)
Remote pacemaker transmission.   

## 2021-11-22 DIAGNOSIS — H35033 Hypertensive retinopathy, bilateral: Secondary | ICD-10-CM | POA: Diagnosis not present

## 2021-11-22 DIAGNOSIS — H34832 Tributary (branch) retinal vein occlusion, left eye, with macular edema: Secondary | ICD-10-CM | POA: Diagnosis not present

## 2021-11-22 DIAGNOSIS — H40052 Ocular hypertension, left eye: Secondary | ICD-10-CM | POA: Diagnosis not present

## 2021-11-22 DIAGNOSIS — H353132 Nonexudative age-related macular degeneration, bilateral, intermediate dry stage: Secondary | ICD-10-CM | POA: Diagnosis not present

## 2021-11-22 DIAGNOSIS — H04123 Dry eye syndrome of bilateral lacrimal glands: Secondary | ICD-10-CM | POA: Diagnosis not present

## 2021-11-23 NOTE — Progress Notes (Signed)
Cardiology Office Note  Date:  11/25/2021   ID:  Lauren Henry, DOB 01-27-1941, MRN [id number]  PCP:  Lauren Garbe, MD  Cardiologist:   Lauren Aultman [place], MD   Chief Complaint  Patient presents with   Hypertension   Coronary Artery Disease      History of Present Illness: Lauren Henry is a 80 y.o. female  seen to establish follow up cardiac care. Previously seen by Dr Duke Salvia. She has  complete heart block s/p PPM, paroxysmal atrial fibrillation, mild-moderate CAD, and hypertension.  Lauren Henry had a pacemaker implanted by Dr. Ladona Henry in 2015 due to complete heart block.  She was minimally symptomatic and reports that it was incidentally found when she presented for knee surgery.  She has difficult to manage hypertension.  She had  an echo 09/13/18 that revealed LVEF 45-50% with diffuse hypokinesis and severe mitral annular calcification.  A Lexiscan Myoview 10/07/18 that revealed a reversible defect in the inferior, apical and inferolateral regions.  This was associated with apical akinesis and inferoapical dyskinesis.  This was thought to be either ischemia or a pacemaker artifact.  Given that she was feeling well and has chronic kidney disease she elected medical management.  Clonidine was increased due to poorly controlled blood pressure.  She was admitted with hypertensive urgency.  Her blood pressure was as high as the 240s at home.  Troponin was mildly elevated and she underwent cardiac catheterization 10/2018 that revealed mild to moderate non-obstructive CAD.  There was some concern about the proximal LAD.  However FFR was unremarkable.  On follow up today she is doing very well. Checks her BP three times a week and is typically is between 120-130 systolic. She is tolerating medication well. Pacer check on November 17 showed normal pacer function and normal HR histogram. She denies palpitations, dizziness, chest pain or SOB. Is limited by arthritis in her hips and knees. 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; atrial fib (PAF)   History of blood transfusion as a child   no abnormal reaction noted   History of colon polyps    Hyperlipidemia    takes Pravastatin daily   Hypertension    takes Azor and Metoprolol daily   Hypothyroidism    takes Synthroid daily   Joint pain    Presence of permanent cardiac pacemaker    Urinary frequency    Urinary urgency     Past Surgical History:  Procedure Laterality Date   ABDOMINAL HYSTERECTOMY     cataract surgery Bilateral    CHOLECYSTECTOMY  1971   COLONOSCOPY     EYE SURGERY     INSERT / REPLACE / REMOVE PACEMAKER     Medtronic PPM 11/01/14 (Dr. Lewayne Henry)   JOINT REPLACEMENT Right    knee   LEFT HEART CATH AND CORONARY ANGIOGRAPHY N/A 11/01/2018   Procedure: LEFT HEART CATH AND CORONARY ANGIOGRAPHY;  Surgeon: Lauren Kendall, MD;  Location: [facility] INVASIVE CV LAB;  Service: Cardiovascular;  Laterality: N/A;   PERMANENT PACEMAKER INSERTION N/A 11/01/2014   Procedure: PERMANENT PACEMAKER INSERTION;  Surgeon: Lauren Maw, MD;  Location: [facility] CATH LAB;  Service: Cardiovascular;  Laterality: N/A;   stomach stapled  1985   TOTAL KNEE ARTHROPLASTY Left 11/05/2015  Procedure: TOTAL KNEE ARTHROPLASTY;  Surgeon: Lauren Huh, MD;  Location: [facility] OR;  Service: Orthopedics;  Laterality: Left;     Current Outpatient Medications  Medication Sig Dispense Refill   apixaban (ELIQUIS) 5 MG TABS tablet Take 1 tablet (5 mg total) by mouth 2 (two) times daily. 180 tablet 1   carvedilol (COREG CR) 20 MG 24 hr capsule TAKE 1 CAPSULE(20 MG) BY MOUTH DAILY 90 capsule 3   cloNIDine (CATAPRES) 0.1 MG tablet Take 0.05 mg by mouth 2 (two) times daily.     ferrous sulfate 325 (65 FE) MG tablet Take 325 mg by mouth daily with breakfast.     hydrALAZINE  (APRESOLINE) 50 MG tablet TAKE ONE AND ONE-HALF TABLET BY MOUTH EVERY MORNING AND AT LUNCH. THEN TAKE 1 TABLET BY MOUTH EVERY EVENING 360 tablet 3   levothyroxine (SYNTHROID, LEVOTHROID) 150 MCG tablet Take 150 mcg by mouth daily before breakfast.   0   mirtazapine (REMERON) 7.5 MG tablet Take 7.5 mg by mouth at bedtime.      Multiple Vitamin (MULTIVITAMIN WITH MINERALS) TABS tablet Take 1 tablet by mouth daily.     pantoprazole (PROTONIX) 40 MG tablet Take 40 mg by mouth daily.  3   rosuvastatin (CRESTOR) 40 MG tablet TAKE 1 TABLET BY MOUTH DAILY 90 tablet 3   spironolactone (ALDACTONE) 25 MG tablet Take 25 mg by mouth daily.     vitamin C (ASCORBIC ACID) 250 MG tablet Take 250 mg by mouth daily.     VITAMIN D, CHOLECALCIFEROL, PO Take 1 tablet by mouth daily.     No current facility-administered medications for this visit.    Allergies:   Olmesartan and Amlodipine    Social History:  The patient  reports that she has never smoked. She has never used smokeless tobacco. She reports current alcohol use of about 1.0 standard drink per week. She reports that she does not use drugs.   Family History:  The patient's family history includes Heart attack in her father; Heart disease in her father; Stroke in her sister.    ROS:  Please see the history of present illness.   Otherwise, review of systems are positive for none.   All other systems are reviewed and negative.    PHYSICAL EXAM: VS:  BP 126/78   Pulse (!) 109   Ht 5\' 5"  (1.651 m)   Wt 189 lb 6.4 oz (85.9 kg)   SpO2 99%   BMI 31.52 kg/m  , BMI Body mass index is 31.52 kg/m. GENERAL:  Well appearing, overweight WF in NAD HEENT: Pupils equal round and reactive, fundi not visualized, oral mucosa unremarkable NECK:  No jugular venous distention, waveform within normal limits, carotid upstroke brisk and symmetric, no bruits LUNGS:  Clear to auscultation bilaterally HEART:  RRR.  PMI not displaced or sustained,S1 and S2 within normal  limits, no S3, no S4, no clicks, no rubs, II/VI systolic murmur at LUSB ABD:  Flat, positive bowel sounds normal in frequency in pitch, no bruits, no rebound, no guarding, no midline pulsatile mass, no hepatomegaly, no splenomegaly EXT:  2 plus pulses throughout, no edema, no cyanosis no clubbing SKIN:  No rashes no nodules NEURO:  Cranial nerves II through XII grossly intact, motor grossly intact throughout PSYCH:  Cognitively intact, oriented to person place and time   EKG:  EKG is not ordered today.    Echo 10/30/14: Study Conclusions  - Left ventricle: The cavity size was normal. There was mild focal  basal hypertrophy of the septum. Systolic function was normal.   The estimated ejection fraction was in the range of 60% to 65%.   Wall motion was normal; there were no regional wall motion   abnormalities. - Aortic valve: Trileaflet; mildly thickened, mildly calcified   leaflets. Transvalvular velocity was minimally increased. There   was no stenosis. - Mitral valve: Calcified annulus. There was mild regurgitation. - Right ventricle: The cavity size was mildly dilated. Wall   thickness was normal.   Echo 09/13/18: Study Conclusions   - Left ventricle: The cavity size was normal. There was mild focal   basal hypertrophy of the septum. Systolic function was mildly   reduced. The estimated ejection fraction was in the range of 45%   to 50%. Diffuse hypokinesis. Doppler parameters are consistent   with both elevated ventricular end-diastolic filling pressure and   elevated left atrial filling pressure. - Mitral valve: Severely calcified annulus. Mildly thickened,   mildly calcified leaflets . - Left atrium: The atrium was mildly dilated. - Atrial septum: No defect or patent foramen ovale was identified.   Lexiscan Myoview 10/07/18:   The left ventricular ejection fraction is mildly decreased (45-54%). Nuclear stress EF: 54%. No T wave inversion was noted during stress. There  was no ST segment deviation noted during stress. Defect 1: There is a large defect of moderate severity.   Large size, moderate severity partially reversible inferior, apical and inferolateral perfusion defect, suggestive of ischemia. This could also represent pacemaker-related artifact. There is apical akinesis and inferoapical dyskinesis. LVEF 54%. This is an intermediate risk study. Clinical correlation is advised.   LHC 10/2018: Conclusions: Mild to moderate, non-obstructive CAD.  Most severe lesion is a 50-60% stenosis in the proximal LCx that is not hemodynamically significant (DFR 1.0).  I suspect mild troponin elevation reflects supply-demand mismatch in the setting of uncontrolled hypertension and renal insufficiency. Normal left ventricular filling pressure.   Recommendations: Medical therapy and risk factor modification to prevent progression of disease. Consider noninvasive evaluation for renal artery stenosis (if not already performed as an outpatient), given hypertension and renal insufficiency.  Recent Labs: 06/06/2021: ALT 18; BUN 30; Creatinine, Ser 1.60; Hemoglobin 11.0; Platelets 157; Potassium 4.5; Sodium 137    Lipid Panel    Component Value Date/Time   CHOL 150 06/06/2021 0821   TRIG 71 06/06/2021 0821   HDL 51 06/06/2021 0821   CHOLHDL 2.9 06/06/2021 0821   CHOLHDL 2.3 10/30/2018 1852   VLDL 10 10/30/2018 1852   LDLCALC 85 06/06/2021 0821   07/22/2019: Total cholesterol 127, triglycerides 80, HDL 42, LDL 69 Dated 08/28/21: Hgb 11.2. creatinine 1.5. BUN 30, otherwise CBC and CMET normal. Cholesterol 129, triglycerides 64, HDL 41, LDL 75. TSH normal.  Wt Readings from Last 3 Encounters:  11/25/21 189 lb 6.4 oz (85.9 kg)  06/11/21 195 lb 9.6 oz (88.7 kg)  03/06/21 196 lb (88.9 kg)      ASSESSMENT AND PLAN:  PAF:   Rate is stable.Continue carvedilol and Eliquis.  2.  Labile Hypertension:   Blood pressure is now under excellent control on current regimen.  She is tolerating well. Continue.   3. Non-obstructive CAD: Left heart cath 10/2018 showed nonobstructive disease.  LDL was 72 on 08/2020.  Continue carvedilol and rosuvastatin.  She is not on aspirin given that she is on Eliquis.  4. Hyperlipidemia- on Crestor   5. CHB s/p PPM:   Managed by Dr. Ladona Henry.     Current medicines are reviewed  at length with the patient today.  The patient does not have concerns regarding medicines.  The following changes have been made: none  Labs/ tests ordered today include:   No orders of the defined types were placed in this encounter.    Disposition:  follow up in 6 months    Signed, Tiffany C. Duke Salvia, MD, [facility]  11/25/2021 1:47 PM    [facility]

## 2021-11-25 ENCOUNTER — Encounter: Payer: Self-pay | Admitting: Cardiology

## 2021-11-25 ENCOUNTER — Ambulatory Visit: Payer: Medicare PPO | Admitting: Cardiology

## 2021-11-25 ENCOUNTER — Other Ambulatory Visit: Payer: Self-pay

## 2021-11-25 VITALS — BP 126/78 | HR 109 | Ht 65.0 in | Wt 189.4 lb

## 2021-11-25 DIAGNOSIS — I480 Paroxysmal atrial fibrillation: Secondary | ICD-10-CM

## 2021-11-25 DIAGNOSIS — Z950 Presence of cardiac pacemaker: Secondary | ICD-10-CM

## 2021-11-25 DIAGNOSIS — I2510 Atherosclerotic heart disease of native coronary artery without angina pectoris: Secondary | ICD-10-CM | POA: Diagnosis not present

## 2021-11-25 DIAGNOSIS — I442 Atrioventricular block, complete: Secondary | ICD-10-CM

## 2021-11-25 DIAGNOSIS — I10 Essential (primary) hypertension: Secondary | ICD-10-CM

## 2021-11-25 DIAGNOSIS — E7800 Pure hypercholesterolemia, unspecified: Secondary | ICD-10-CM | POA: Diagnosis not present

## 2021-12-24 ENCOUNTER — Ambulatory Visit: Payer: Medicare PPO | Admitting: Cardiology

## 2022-01-07 DIAGNOSIS — H40052 Ocular hypertension, left eye: Secondary | ICD-10-CM | POA: Diagnosis not present

## 2022-01-07 DIAGNOSIS — H353132 Nonexudative age-related macular degeneration, bilateral, intermediate dry stage: Secondary | ICD-10-CM | POA: Diagnosis not present

## 2022-01-07 DIAGNOSIS — H35033 Hypertensive retinopathy, bilateral: Secondary | ICD-10-CM | POA: Diagnosis not present

## 2022-01-07 DIAGNOSIS — H34832 Tributary (branch) retinal vein occlusion, left eye, with macular edema: Secondary | ICD-10-CM | POA: Diagnosis not present

## 2022-02-04 ENCOUNTER — Ambulatory Visit (INDEPENDENT_AMBULATORY_CARE_PROVIDER_SITE_OTHER): Payer: Medicare PPO

## 2022-02-04 DIAGNOSIS — I442 Atrioventricular block, complete: Secondary | ICD-10-CM | POA: Diagnosis not present

## 2022-02-04 LAB — CUP PACEART REMOTE DEVICE CHECK
Battery Impedance: 734 Ohm
Battery Remaining Longevity: 71 mo
Battery Voltage: 2.78 V
Brady Statistic AP VP Percent: 24 %
Brady Statistic AP VS Percent: 0 %
Brady Statistic AS VP Percent: 76 %
Brady Statistic AS VS Percent: 0 %
Date Time Interrogation Session: 20230214105616
Implantable Lead Implant Date: 20151111
Implantable Lead Implant Date: 20151111
Implantable Lead Location: 753859
Implantable Lead Location: 753860
Implantable Lead Model: 5076
Implantable Lead Model: 5076
Implantable Pulse Generator Implant Date: 20151111
Lead Channel Impedance Value: 448 Ohm
Lead Channel Impedance Value: 714 Ohm
Lead Channel Pacing Threshold Amplitude: 0.5 V
Lead Channel Pacing Threshold Amplitude: 1.25 V
Lead Channel Pacing Threshold Pulse Width: 0.4 ms
Lead Channel Pacing Threshold Pulse Width: 0.4 ms
Lead Channel Setting Pacing Amplitude: 1.5 V
Lead Channel Setting Pacing Amplitude: 2.5 V
Lead Channel Setting Pacing Pulse Width: 0.46 ms
Lead Channel Setting Sensing Sensitivity: 2 mV

## 2022-02-05 DIAGNOSIS — R110 Nausea: Secondary | ICD-10-CM | POA: Diagnosis not present

## 2022-02-05 DIAGNOSIS — R051 Acute cough: Secondary | ICD-10-CM | POA: Diagnosis not present

## 2022-02-05 DIAGNOSIS — I480 Paroxysmal atrial fibrillation: Secondary | ICD-10-CM | POA: Diagnosis not present

## 2022-02-05 DIAGNOSIS — Z950 Presence of cardiac pacemaker: Secondary | ICD-10-CM | POA: Diagnosis not present

## 2022-02-05 DIAGNOSIS — R5383 Other fatigue: Secondary | ICD-10-CM | POA: Diagnosis not present

## 2022-02-05 DIAGNOSIS — J3489 Other specified disorders of nose and nasal sinuses: Secondary | ICD-10-CM | POA: Diagnosis not present

## 2022-02-05 DIAGNOSIS — Z1152 Encounter for screening for COVID-19: Secondary | ICD-10-CM | POA: Diagnosis not present

## 2022-02-08 ENCOUNTER — Other Ambulatory Visit: Payer: Self-pay | Admitting: Cardiovascular Disease

## 2022-02-10 NOTE — Telephone Encounter (Signed)
Rx(s) sent to pharmacy electronically.  

## 2022-02-10 NOTE — Progress Notes (Signed)
Remote pacemaker transmission.   

## 2022-02-17 ENCOUNTER — Emergency Department (HOSPITAL_COMMUNITY): Payer: Medicare PPO

## 2022-02-17 ENCOUNTER — Other Ambulatory Visit: Payer: Self-pay

## 2022-02-17 ENCOUNTER — Encounter (HOSPITAL_COMMUNITY): Payer: Self-pay

## 2022-02-17 ENCOUNTER — Observation Stay (HOSPITAL_COMMUNITY)
Admission: EM | Admit: 2022-02-17 | Discharge: 2022-02-18 | Disposition: A | Discharge status: Home Health | Payer: Medicare PPO | Attending: Internal Medicine | Admitting: Internal Medicine

## 2022-02-17 DIAGNOSIS — E785 Hyperlipidemia, unspecified: Secondary | ICD-10-CM | POA: Insufficient documentation

## 2022-02-17 DIAGNOSIS — N179 Acute kidney failure, unspecified: Secondary | ICD-10-CM | POA: Diagnosis not present

## 2022-02-17 DIAGNOSIS — R778 Other specified abnormalities of plasma proteins: Secondary | ICD-10-CM

## 2022-02-17 DIAGNOSIS — N183 Chronic kidney disease, stage 3 unspecified: Secondary | ICD-10-CM

## 2022-02-17 DIAGNOSIS — Z96652 Presence of left artificial knee joint: Secondary | ICD-10-CM | POA: Diagnosis not present

## 2022-02-17 DIAGNOSIS — R946 Abnormal results of thyroid function studies: Secondary | ICD-10-CM | POA: Insufficient documentation

## 2022-02-17 DIAGNOSIS — J320 Chronic maxillary sinusitis: Secondary | ICD-10-CM | POA: Insufficient documentation

## 2022-02-17 DIAGNOSIS — I700 Atherosclerosis of aorta: Secondary | ICD-10-CM | POA: Insufficient documentation

## 2022-02-17 DIAGNOSIS — D509 Iron deficiency anemia, unspecified: Secondary | ICD-10-CM

## 2022-02-17 DIAGNOSIS — R29810 Facial weakness: Secondary | ICD-10-CM | POA: Diagnosis not present

## 2022-02-17 DIAGNOSIS — R5381 Other malaise: Secondary | ICD-10-CM | POA: Diagnosis not present

## 2022-02-17 DIAGNOSIS — Z20822 Contact with and (suspected) exposure to covid-19: Secondary | ICD-10-CM | POA: Insufficient documentation

## 2022-02-17 DIAGNOSIS — R059 Cough, unspecified: Secondary | ICD-10-CM | POA: Diagnosis not present

## 2022-02-17 DIAGNOSIS — D631 Anemia in chronic kidney disease: Secondary | ICD-10-CM | POA: Diagnosis not present

## 2022-02-17 DIAGNOSIS — R55 Syncope and collapse: Secondary | ICD-10-CM | POA: Diagnosis not present

## 2022-02-17 DIAGNOSIS — R42 Dizziness and giddiness: Secondary | ICD-10-CM | POA: Diagnosis not present

## 2022-02-17 DIAGNOSIS — R9431 Abnormal electrocardiogram [ECG] [EKG]: Secondary | ICD-10-CM | POA: Insufficient documentation

## 2022-02-17 DIAGNOSIS — N1832 Chronic kidney disease, stage 3b: Secondary | ICD-10-CM | POA: Diagnosis not present

## 2022-02-17 DIAGNOSIS — Z79899 Other long term (current) drug therapy: Secondary | ICD-10-CM | POA: Insufficient documentation

## 2022-02-17 DIAGNOSIS — I480 Paroxysmal atrial fibrillation: Secondary | ICD-10-CM | POA: Diagnosis not present

## 2022-02-17 DIAGNOSIS — Z7901 Long term (current) use of anticoagulants: Secondary | ICD-10-CM | POA: Diagnosis not present

## 2022-02-17 DIAGNOSIS — Z950 Presence of cardiac pacemaker: Secondary | ICD-10-CM | POA: Insufficient documentation

## 2022-02-17 DIAGNOSIS — D72819 Decreased white blood cell count, unspecified: Secondary | ICD-10-CM | POA: Diagnosis not present

## 2022-02-17 DIAGNOSIS — I4891 Unspecified atrial fibrillation: Secondary | ICD-10-CM | POA: Diagnosis not present

## 2022-02-17 DIAGNOSIS — K529 Noninfective gastroenteritis and colitis, unspecified: Secondary | ICD-10-CM | POA: Diagnosis not present

## 2022-02-17 DIAGNOSIS — I3481 Nonrheumatic mitral (valve) annulus calcification: Secondary | ICD-10-CM | POA: Insufficient documentation

## 2022-02-17 DIAGNOSIS — R531 Weakness: Secondary | ICD-10-CM | POA: Insufficient documentation

## 2022-02-17 DIAGNOSIS — R7989 Other specified abnormal findings of blood chemistry: Secondary | ICD-10-CM | POA: Insufficient documentation

## 2022-02-17 DIAGNOSIS — Z79890 Hormone replacement therapy (postmenopausal): Secondary | ICD-10-CM | POA: Insufficient documentation

## 2022-02-17 DIAGNOSIS — R630 Anorexia: Secondary | ICD-10-CM | POA: Insufficient documentation

## 2022-02-17 DIAGNOSIS — I2510 Atherosclerotic heart disease of native coronary artery without angina pectoris: Secondary | ICD-10-CM

## 2022-02-17 DIAGNOSIS — H409 Unspecified glaucoma: Secondary | ICD-10-CM | POA: Insufficient documentation

## 2022-02-17 DIAGNOSIS — E039 Hypothyroidism, unspecified: Secondary | ICD-10-CM | POA: Insufficient documentation

## 2022-02-17 DIAGNOSIS — M1990 Unspecified osteoarthritis, unspecified site: Secondary | ICD-10-CM | POA: Insufficient documentation

## 2022-02-17 DIAGNOSIS — I129 Hypertensive chronic kidney disease with stage 1 through stage 4 chronic kidney disease, or unspecified chronic kidney disease: Secondary | ICD-10-CM | POA: Insufficient documentation

## 2022-02-17 DIAGNOSIS — E7800 Pure hypercholesterolemia, unspecified: Secondary | ICD-10-CM | POA: Diagnosis not present

## 2022-02-17 DIAGNOSIS — E860 Dehydration: Secondary | ICD-10-CM | POA: Diagnosis not present

## 2022-02-17 DIAGNOSIS — I1310 Hypertensive heart and chronic kidney disease without heart failure, with stage 1 through stage 4 chronic kidney disease, or unspecified chronic kidney disease: Secondary | ICD-10-CM | POA: Diagnosis not present

## 2022-02-17 DIAGNOSIS — I951 Orthostatic hypotension: Secondary | ICD-10-CM | POA: Diagnosis not present

## 2022-02-17 DIAGNOSIS — R5383 Other fatigue: Secondary | ICD-10-CM | POA: Diagnosis not present

## 2022-02-17 DIAGNOSIS — D500 Iron deficiency anemia secondary to blood loss (chronic): Secondary | ICD-10-CM | POA: Insufficient documentation

## 2022-02-17 DIAGNOSIS — N184 Chronic kidney disease, stage 4 (severe): Secondary | ICD-10-CM | POA: Diagnosis not present

## 2022-02-17 DIAGNOSIS — Z8249 Family history of ischemic heart disease and other diseases of the circulatory system: Secondary | ICD-10-CM | POA: Insufficient documentation

## 2022-02-17 DIAGNOSIS — M545 Low back pain, unspecified: Secondary | ICD-10-CM | POA: Diagnosis not present

## 2022-02-17 DIAGNOSIS — R944 Abnormal results of kidney function studies: Secondary | ICD-10-CM | POA: Insufficient documentation

## 2022-02-17 DIAGNOSIS — I442 Atrioventricular block, complete: Secondary | ICD-10-CM | POA: Insufficient documentation

## 2022-02-17 DIAGNOSIS — I10 Essential (primary) hypertension: Secondary | ICD-10-CM | POA: Diagnosis present

## 2022-02-17 DIAGNOSIS — Z9114 Patient's other noncompliance with medication regimen: Secondary | ICD-10-CM | POA: Insufficient documentation

## 2022-02-17 DIAGNOSIS — T381X6A Underdosing of thyroid hormones and substitutes, initial encounter: Secondary | ICD-10-CM | POA: Insufficient documentation

## 2022-02-17 DIAGNOSIS — J3489 Other specified disorders of nose and nasal sinuses: Secondary | ICD-10-CM | POA: Insufficient documentation

## 2022-02-17 DIAGNOSIS — D649 Anemia, unspecified: Secondary | ICD-10-CM | POA: Diagnosis present

## 2022-02-17 LAB — TSH: TSH: 8.333 u[IU]/mL — ABNORMAL HIGH (ref 0.350–4.500)

## 2022-02-17 LAB — RESP PANEL BY RT-PCR (FLU A&B, COVID) ARPGX2
Influenza A by PCR: NEGATIVE
Influenza B by PCR: NEGATIVE
SARS Coronavirus 2 by RT PCR: NEGATIVE

## 2022-02-17 LAB — BASIC METABOLIC PANEL
Anion gap: 11 (ref 5–15)
BUN: 29 mg/dL — ABNORMAL HIGH (ref 8–23)
CO2: 22 mmol/L (ref 22–32)
Calcium: 9.9 mg/dL (ref 8.9–10.3)
Chloride: 100 mmol/L (ref 98–111)
Creatinine, Ser: 2.29 mg/dL — ABNORMAL HIGH (ref 0.44–1.00)
GFR, Estimated: 21 mL/min — ABNORMAL LOW (ref >60)
Glucose, Bld: 145 mg/dL — ABNORMAL HIGH (ref 70–99)
Potassium: 3.9 mmol/L (ref 3.5–5.1)
Sodium: 133 mmol/L — ABNORMAL LOW (ref 135–145)

## 2022-02-17 LAB — DIFFERENTIAL
Abs Immature Granulocytes: 0.01 10*3/uL (ref 0.00–0.07)
Basophils Absolute: 0 10*3/uL (ref 0.0–0.1)
Basophils Relative: 1 %
Eosinophils Absolute: 0 10*3/uL (ref 0.0–0.5)
Eosinophils Relative: 1 %
Immature Granulocytes: 1 %
Lymphocytes Relative: 28 %
Lymphs Abs: 0.4 10*3/uL — ABNORMAL LOW (ref 0.7–4.0)
Monocytes Absolute: 0 10*3/uL — ABNORMAL LOW (ref 0.1–1.0)
Monocytes Relative: 2 %
Neutro Abs: 0.9 10*3/uL — ABNORMAL LOW (ref 1.7–7.7)
Neutrophils Relative %: 67 %

## 2022-02-17 LAB — HEPATIC FUNCTION PANEL
ALT: 58 U/L — ABNORMAL HIGH (ref 0–44)
AST: 52 U/L — ABNORMAL HIGH (ref 15–41)
Albumin: 3 g/dL — ABNORMAL LOW (ref 3.5–5.0)
Alkaline Phosphatase: 49 U/L (ref 38–126)
Bilirubin, Direct: 0.2 mg/dL (ref 0.0–0.2)
Indirect Bilirubin: 0.4 mg/dL (ref 0.3–0.9)
Total Bilirubin: 0.6 mg/dL (ref 0.3–1.2)
Total Protein: 7.6 g/dL (ref 6.5–8.1)

## 2022-02-17 LAB — CBC
HCT: 32 % — ABNORMAL LOW (ref 36.0–46.0)
Hemoglobin: 10.7 g/dL — ABNORMAL LOW (ref 12.0–15.0)
MCH: 29.8 pg (ref 26.0–34.0)
MCHC: 33.4 g/dL (ref 30.0–36.0)
MCV: 89.1 fL (ref 80.0–100.0)
Platelets: 162 10*3/uL (ref 150–400)
RBC: 3.59 MIL/uL — ABNORMAL LOW (ref 3.87–5.11)
RDW: 13.4 % (ref 11.5–15.5)
WBC: 1.4 10*3/uL — CL (ref 4.0–10.5)
nRBC: 0 % (ref 0.0–0.2)

## 2022-02-17 MED ORDER — LACTATED RINGERS IV BOLUS
500.0000 mL | Freq: Once | INTRAVENOUS | Status: AC
Start: 2022-02-17 — End: 2022-02-17
  Administered 2022-02-17: 500 mL via INTRAVENOUS

## 2022-02-17 MED ORDER — MIRTAZAPINE 15 MG PO TABS
7.5000 mg | ORAL_TABLET | Freq: Every day | ORAL | Status: DC
  Administered 2022-02-17: 7.5 mg via ORAL
  Filled 2022-02-17: qty 1

## 2022-02-17 MED ORDER — APIXABAN 5 MG PO TABS
5.0000 mg | ORAL_TABLET | Freq: Two times a day (BID) | ORAL | Status: DC
  Administered 2022-02-17 – 2022-02-18 (×2): 5 mg via ORAL
  Filled 2022-02-17 (×2): qty 1

## 2022-02-17 MED ORDER — SODIUM CHLORIDE 0.9 % IV SOLN: Freq: Once | INTRAVENOUS | Status: AC

## 2022-02-17 MED ORDER — ROSUVASTATIN CALCIUM 20 MG PO TABS
40.0000 mg | ORAL_TABLET | Freq: Every day | ORAL | Status: DC
Start: 2022-02-18 — End: 2022-02-18
  Administered 2022-02-18: 40 mg via ORAL
  Filled 2022-02-17: qty 2

## 2022-02-17 MED ORDER — ACETAMINOPHEN 325 MG PO TABS: 650.0000 mg | ORAL_TABLET | Freq: Four times a day (QID) | ORAL | Status: DC | PRN

## 2022-02-17 MED ORDER — CARVEDILOL PHOSPHATE ER 20 MG PO CP24
20.0000 mg | ORAL_CAPSULE | Freq: Every day | ORAL | Status: DC
  Filled 2022-02-17: qty 1

## 2022-02-17 MED ORDER — POLYETHYLENE GLYCOL 3350 17 G PO PACK: 17.0000 g | PACK | Freq: Every day | ORAL | Status: DC | PRN

## 2022-02-17 MED ORDER — PANTOPRAZOLE SODIUM 40 MG PO TBEC
40.0000 mg | DELAYED_RELEASE_TABLET | Freq: Every day | ORAL | Status: DC
  Administered 2022-02-17 – 2022-02-18 (×2): 40 mg via ORAL
  Filled 2022-02-17 (×2): qty 1

## 2022-02-17 MED ORDER — LEVOTHYROXINE SODIUM 75 MCG PO TABS
150.0000 ug | ORAL_TABLET | Freq: Every day | ORAL | Status: DC
  Administered 2022-02-18: 150 ug via ORAL
  Filled 2022-02-17: qty 2

## 2022-02-17 MED ORDER — SODIUM CHLORIDE 0.9 % IV SOLN: INTRAVENOUS | Status: AC

## 2022-02-17 MED ORDER — SODIUM CHLORIDE 0.9% FLUSH
3.0000 mL | Freq: Two times a day (BID) | INTRAVENOUS | Status: DC
  Administered 2022-02-18: 3 mL via INTRAVENOUS

## 2022-02-17 MED ORDER — ACETAMINOPHEN 650 MG RE SUPP: 650.0000 mg | Freq: Four times a day (QID) | RECTAL | Status: DC | PRN

## 2022-02-17 MED ORDER — CLONIDINE HCL 0.1 MG PO TABS: 0.0500 mg | ORAL_TABLET | Freq: Two times a day (BID) | ORAL | Status: DC

## 2022-02-17 NOTE — ED Provider Triage Note (Signed)
Emergency Medicine Provider Triage Evaluation Note  Lauren Henry , a 81 y.o. female  was evaluated in triage.  Pt complains of nonspecific and poorly characterized dizziness since yesterday.  Patient states she is been feeling generally weak over the last couple of weeks.  Patient states she was unsteady on her feet yesterday and this morning.  Dizziness is since been improving.  Also reporting associated cough.  No shortness of breath or chest pain.  Review of Systems  Positive:  Negative: See above   Physical Exam  BP 118/63 (BP Location: Right Arm)    Pulse 95    Temp 97.6 F (36.4 C) (Oral)    Resp 20    SpO2 97%  Gen:   Awake, no distress   Resp:  Normal effort  MSK:   Moves extremities without difficulty  Other:    Medical Decision Making  Medically screening exam initiated at 1:24 PM.  Appropriate orders placed.  Lauren Henry was informed that the remainder of the evaluation will be completed by another provider, this initial triage assessment does not replace that evaluation, and the importance of remaining in the ED until their evaluation is complete.     Honor Loh [place], [place] 02/17/22 1326

## 2022-02-17 NOTE — ED Notes (Signed)
Called [facility] and they have not approved the patient for the unit yet ... will call back

## 2022-02-17 NOTE — ED Triage Notes (Signed)
Patient complains of general malaise since Saturday and dizziness that started yesterday. Patient also reports loss of appetite since Saturday. Patient alert, oriented, and in no apparent distress at this time. No dysarthria, slight right facial droop that patient states is normal for her, no arm drift, sensation equal bilaterally, no vision changes.

## 2022-02-17 NOTE — ED Notes (Signed)
Report handed off to [employer] via secure chat at this time.

## 2022-02-17 NOTE — ED Notes (Signed)
Patient transported to CT 

## 2022-02-17 NOTE — H&P (Signed)
History and Physical   Lauren Henry [id number] DOB: [birth year] DOA: 02/17/2022  PCP: Haywood Pao, MD   Patient coming from: Home  Chief Complaint: Dizziness and weakness  HPI: Lauren Henry is a 81 y.o. female with medical history significant of low back pain, complete heart block status post pacemaker, atrial fibrillation, hypothyroidism, hyperlipidemia, hypertension, gastroenteropathy, CKD 3, CAD, anemia presenting with ongoing dizziness and weakness.  Patient states that she has had some dizziness for the past day.  She also reports several weeks of decreased p.o. intake.  She states that she can take some fluids okay but has had minimal food.  During this time she also has had some increasing generalized weakness.  She denies fever, chills, chest pain, shortness of breath, abdominal pain, constipation, diarrhea, nausea, vomiting.  ED Course: Vital signs in the ED were stable.  Orthostatic vital signs positive for decreased 20 from laying to sitting for blood pressure of [id number] and [id number] systolic.  Lab work-up included CMP which showed sodium 133, creatinine elevated 2.29 from a previous baseline of 1.6, glucose 145, albumin 3, AST 52, ALT 58.  CBC with leukopenia at 1.4 and stable anemia 10.7.  TSH elevated at 8.3.  Respiratory panel for flu COVID-negative.  Blood smear sent for review, urinalysis pending.  Chest x-ray showed bronchitic changes without infiltrate.  CT head showed no acute abnormality.  Patient received 500 cc bolus in the ED.  Review of Systems: As per HPI otherwise all other systems reviewed and are negative.  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, developed CHB 10/2014 s/p Medtronic PPM; atrial fib (PAF)   History of blood transfusion as a child   no abnormal reaction noted   History of colon polyps    Hyperlipidemia    takes Pravastatin daily    Hypertension    takes Azor and Metoprolol daily   Hypothyroidism    takes Synthroid daily   Joint pain    Presence of permanent cardiac pacemaker    Urinary frequency    Urinary urgency     Past Surgical History:  Procedure Laterality Date   ABDOMINAL HYSTERECTOMY     cataract surgery Bilateral    CHOLECYSTECTOMY  1971   COLONOSCOPY     EYE SURGERY     INSERT / REPLACE / REMOVE PACEMAKER     Medtronic PPM 11/01/14 (Dr. Cristopher Peru)   JOINT REPLACEMENT Right    knee   LEFT HEART CATH AND CORONARY ANGIOGRAPHY N/A 11/01/2018   Procedure: LEFT HEART CATH AND CORONARY ANGIOGRAPHY;  Surgeon: Nelva Bush, MD;  Location: [facility];  Service: Cardiovascular;  Laterality: N/A;   PERMANENT PACEMAKER INSERTION N/A 11/01/2014   Procedure: PERMANENT PACEMAKER INSERTION;  Surgeon: Evans Lance, MD;  Location: [facility] CATH [facility];  Service: Cardiovascular;  Laterality: N/A;   stomach stapled  1985   TOTAL KNEE ARTHROPLASTY Left 11/05/2015   Procedure: TOTAL KNEE ARTHROPLASTY;  Surgeon: Vickey Huger, MD;  Location: [facility];  Service: Orthopedics;  Laterality: Left;    Social History  reports that she has never smoked. She has never used smokeless tobacco. She reports current alcohol use of about 1.0 standard drink per week. She reports that she does not use drugs.  Allergies  Allergen Reactions   Olmesartan     enteritis   Amlodipine Swelling  and Rash    Family History  Problem Relation Age of Onset   Heart disease Father    Heart attack Father    Stroke Sister   Reviewed on admission  Prior to Admission medications   Medication Sig Start Date End Date Taking? Authorizing Provider  apixaban (ELIQUIS) 5 MG TABS tablet Take 1 tablet (5 mg total) by mouth 2 (two) times daily. 02/17/20   Skeet Latch, MD  carvedilol (COREG CR) 20 MG 24 hr capsule TAKE 1 CAPSULE(20 MG) BY MOUTH DAILY 10/17/21   Skeet Latch, MD  cloNIDine (CATAPRES) 0.1 MG tablet Take 0.05 mg by mouth 2  (two) times daily.    [provider]  ferrous sulfate 325 (65 FE) MG tablet Take 325 mg by mouth daily with breakfast.    [provider]  hydrALAZINE (APRESOLINE) 50 MG tablet TAKE ONE AND ONE-HALF TABLET BY MOUTH EVERY MORNING AND AT LUNCH. THEN TAKE 1 TABLET BY MOUTH EVERY EVENING 07/16/21   Skeet Latch, MD  levothyroxine (SYNTHROID, LEVOTHROID) 150 MCG tablet Take 150 mcg by mouth daily before breakfast.  10/15/17   [provider]  mirtazapine (REMERON) 7.5 MG tablet Take 7.5 mg by mouth at bedtime.  11/03/18   [provider]  Multiple Vitamin (MULTIVITAMIN WITH MINERALS) TABS tablet Take 1 tablet by mouth daily.    [provider]  pantoprazole (PROTONIX) 40 MG tablet Take 40 mg by mouth daily. 11/10/18   [provider]  rosuvastatin (CRESTOR) 40 MG tablet TAKE 1 TABLET BY MOUTH DAILY 02/10/22   Skeet Latch, MD  spironolactone (ALDACTONE) 25 MG tablet Take 25 mg by mouth daily.    [provider]  vitamin C (ASCORBIC ACID) 250 MG tablet Take 250 mg by mouth daily.    [provider]  VITAMIN D, CHOLECALCIFEROL, PO Take 1 tablet by mouth daily.    [provider]    Physical Exam: Vitals:   02/17/22 1730 02/17/22 1800 02/17/22 1830 02/17/22 1921  BP: (!) 158/83 117/78 120/67   Pulse: 71 61 62   Resp: 17 13 16    Temp:      TempSrc:      SpO2: 98% 96% 95%   Weight:    92 kg  Height:    5\' 5"  (1.651 m)    Physical Exam Constitutional:      General: She is not in acute distress.    Appearance: Normal appearance.  HENT:     Head: Normocephalic and atraumatic.     Mouth/Throat:     Mouth: Mucous membranes are moist.     Pharynx: Oropharynx is clear.  Eyes:     Extraocular Movements: Extraocular movements intact.     Pupils: Pupils are equal, round, and reactive to light.  Cardiovascular:     Rate and Rhythm: Normal rate and regular rhythm.     Pulses: Normal pulses.     Heart sounds:  Normal heart sounds.  Pulmonary:     Effort: Pulmonary effort is normal. No respiratory distress.     Breath sounds: Normal breath sounds.  Abdominal:     General: Bowel sounds are normal. There is no distension.     Palpations: Abdomen is soft.     Tenderness: There is no abdominal tenderness.  Musculoskeletal:        General: No swelling or deformity.  Skin:    General: Skin is warm and dry.  Neurological:     General: No focal deficit present.  Mental Status: Mental status is at baseline.   Labs on Admission: I have personally reviewed following labs and imaging studies  CBC: Recent Labs  Lab 02/17/22 1315 02/17/22 1433  WBC 1.4* 1.4*  NEUTROABS  --  0.9*  HGB 10.6* 10.7*  HCT 32.3* 32.0*  MCV 89.7 89.1  PLT 157 0000000    Basic Metabolic Panel: Recent Labs  Lab 02/17/22 1315  NA 133*  K 3.9  CL 100  CO2 22  GLUCOSE 145*  BUN 29*  CREATININE 2.29*  CALCIUM 9.9    GFR: Estimated Creatinine Clearance: 22 mL/min (A) (by C-G formula based on SCr of 2.29 mg/dL (H)).  Liver Function Tests: Recent Labs  Lab 02/17/22 1520  AST 52*  ALT 58*  ALKPHOS 49  BILITOT 0.6  PROT 7.6  ALBUMIN 3.0*    Urine analysis:    Component Value Date/Time   COLORURINE AMBER (A) 08/24/2020 0140   APPEARANCEUR HAZY (A) 08/24/2020 0140   LABSPEC 1.018 08/24/2020 0140   PHURINE 5.0 08/24/2020 0140   GLUCOSEU NEGATIVE 08/24/2020 0140   HGBUR NEGATIVE 08/24/2020 0140   BILIRUBINUR NEGATIVE 08/24/2020 0140   KETONESUR 5 (A) 08/24/2020 0140   PROTEINUR 100 (A) 08/24/2020 0140   UROBILINOGEN 0.2 10/26/2015 0957   NITRITE NEGATIVE 08/24/2020 0140   LEUKOCYTESUR NEGATIVE 08/24/2020 0140    Radiological Exams on Admission: DG Chest 2 View  Result Date: 02/17/2022 CLINICAL DATA:  Cough, generalized malaise since Saturday, dizziness since yesterday, loss of appetite; history hypertension, atrial fibrillation EXAM: CHEST - 2 VIEW COMPARISON:  10/30/2018 FINDINGS: LEFT subclavian  sequential transvenous pacemaker leads project at RIGHT atrium and RIGHT ventricle. Normal heart size, mediastinal contours, and pulmonary vascularity. Mitral annular calcification and atherosclerotic calcification aorta noted. Bronchitic changes without pulmonary infiltrate, pleural effusion, or pneumothorax. Osseous structures demineralized. IMPRESSION: Bronchitic changes without infiltrate. Aortic Atherosclerosis (ICD10-I70.0). Electronically Signed   By: Lavonia Dana M.D.   On: 02/17/2022 14:08   CT Head Wo Contrast  Result Date: 02/17/2022 CLINICAL DATA:  Dizziness.  Weakness.  General malaise. EXAM: CT HEAD WITHOUT CONTRAST TECHNIQUE: Contiguous axial images were obtained from the base of the skull through the vertex without intravenous contrast. RADIATION DOSE REDUCTION: This exam was performed according to the departmental dose-optimization program which includes automated exposure control, adjustment of the mA and/or kV according to patient size and/or use of iterative reconstruction technique. COMPARISON:  05/26/2020 FINDINGS: Brain: No evidence of acute infarction, hemorrhage, hydrocephalus, extra-axial collection or mass lesion/mass effect. Vascular: No hyperdense vessel or unexpected calcification. Skull: Normal. Negative for fracture or focal lesion. Sinuses/Orbits: There is chronic mucoperiosteal thickening with partially calcified in septated mucous is associated with the right maxillary sinus. Mild mucosal thickening is noted within the left maxillary sinus. Other: None. IMPRESSION: 1. No acute intracranial abnormalities. 2. Chronic bilateral maxillary sinus inflammation. Electronically Signed   By: Kerby Moors M.D.   On: 02/17/2022 15:58    EKG: Independently reviewed.  Atrial sensed ventricular paced rhythm at 97 bpm.  Assessment/Plan Principal Problem:   Acute renal failure superimposed on stage 3 chronic kidney disease (HCC) Active Problems:   Essential hypertension    Hyperlipidemia   Hypothyroidism   PAF (paroxysmal atrial fibrillation) (HCC)   CAD in native artery   Anemia   Gastroenteropathy   Weakness   Orthostatic hypotension  AKI on CKD 3 > Patient presenting with lightheadedness and dizziness and positive orthostasis as below. > Found to have AKI with creatinine elevated to 2.29 from a  baseline of 1.6. > Likely in the setting of dehydration as patient reports decreased p.o. intake. > Received 500 cc bolus in the ED. - Monitor on telemetry - Continue with IV fluids - Avoid nephrotoxic agents - Trend renal function and electrolytes  Orthostatic hypotension Dizziness > Presenting with generalized weakness, dizziness.  Found to have orthostatic hypotension in the ED. > Orthostatics checked in the ED showed drop of 20 and systolic blood pressure from laying to sitting. > In the setting of likely dehydration due to p.o. intake being reduced as above.  Receiving IV fluids as above. - Continue with IV fluids.  Will likely need to repeat orthostatics before discharge to ensure this is improved.  Generalized weakness Decreased appetite > Reported decreased appetite with minimal p.o. intake recently.  Leading to dehydration and orthostatic hypotension as above. > Unclear etiology for this.  Could be viral illness given constellation of other findings as below. > Negative CT head in the ED - Continue with IV fluids as above - PT and OT eval and treat - Nutrition consult  Anemia Leukopenia > Patient noted to have white count of 1.4.  Also noted to have chronic normocytic anemia with a hemoglobin of 10.7. > Unclear etiology at this time.  Smear has been sent for review. > Possible viral illness given her general weakness, leukopenia, mild LFT elevation.  Chest x-ray did note bronchitic changes without infiltrate. - Follow-up smear results - Continue further studies based on these results - Check respiratory viral panel  Atrial fibrillation -  Continue home carvedilol and Eliquis  Hypothyroidism > Patient states she is not always consistent with taking her thyroid medication.  Does have somewhat elevated TSH at 8.3 in the ED. - Continue home Synthroid  Hyperlipidemia - Continue home statin  Hypertension - Continue home clonidine and carvedilol - Holding spironolactone in the setting of AKI as above  CAD - Continue home carvedilol, statin  Gastroenteropathy - Continue home PPI    DVT prophylaxis: Eliquis Code Status:   Full Family Communication:  Updated at bedside Disposition Plan:   Patient is from:  Home  Anticipated DC to:  Home  Anticipated DC date:  1 to 2 days  Anticipated DC barriers: None  Consults called:  None Admission status:  Observation, telemetry  Severity of Illness: The appropriate patient status for this patient is OBSERVATION. Observation status is judged to be reasonable and necessary in order to provide the required intensity of service to ensure the patient's safety. The patient's presenting symptoms, physical exam findings, and initial radiographic and laboratory data in the context of their medical condition is felt to place them at decreased risk for further clinical deterioration. Furthermore, it is anticipated that the patient will be medically stable for discharge from the hospital within 2 midnights of admission.    Marcelyn Bruins MD [facility]  How to contact the [facility] Attending or Consulting provider [place] or covering provider during after hours [place], for this patient?   Check the care team in [facility] and look for a) attending/consulting [facility] provider listed and b) the [facility] team listed Log into [email or web address] and use [place]'s universal password to access. If you do not have the password, please contact the hospital operator. Locate the [facility] provider you are looking for under [facility] and page to a number that you can be directly reached. If you still have  difficulty reaching the provider, please page the [facility] (Director on Call) for the  Hospitalists listed on amion for assistance.  02/17/2022, 7:32 PM

## 2022-02-17 NOTE — ED Provider Notes (Signed)
Patient signed out to me by previous provider. Please refer to their note for full HPI.  Briefly this is an 81 year old female who presented to the emergency department with concern for weakness and fatigue.  Episode of dizziness.  Work-up thus far is concerning for leukopenia, worse than baseline CKD.  We are pending remainder of blood work and imaging.  Plan for medical admission. Physical Exam  BP 120/67    Pulse 62    Temp 97.6 F (36.4 C) (Oral)    Resp 16    SpO2 95%   Physical Exam Vitals and nursing note reviewed.  Constitutional:      Appearance: Normal appearance.     Comments: Fatigued  HENT:     Head: Normocephalic.     Mouth/Throat:     Mouth: Mucous membranes are moist.  Cardiovascular:     Rate and Rhythm: Normal rate.  Pulmonary:     Effort: Pulmonary effort is normal. No respiratory distress.  Abdominal:     Palpations: Abdomen is soft.     Tenderness: There is no abdominal tenderness.  Skin:    General: Skin is warm.  Neurological:     Mental Status: She is alert and oriented to person, place, and time. Mental status is at baseline.  Psychiatric:        Mood and Affect: Mood normal.    Procedures  Procedures  ED Course / MDM    Medical Decision Making Amount and/or Complexity of Data Reviewed Labs: ordered.  Risk Prescription drug management. Decision regarding hospitalization.   TSH came back elevated.  Patient confirms that she has been inconsistently taking her Synthroid.  Patient believes that she probably had a viral illness a couple weeks ago at the start of this which would appear consistent with her current story.  We are pending urinalysis.  Patient does have a pacemaker, EKG is appropriately paced, interrogation has been sent.  No syncope/chest pain.  Plan for medical admission, patient and husband at bedside agree.  Patients evaluation and results requires admission for further treatment and care.  Spoke with hospitalist , reviewed patient's ED  course and they accept admission.  Patient agrees with admission plan, offers no new complaints and is stable/unchanged at time of admit.       Rozelle Logan, DO 02/17/22 1930

## 2022-02-17 NOTE — ED Provider Notes (Signed)
[facility] EMERGENCY DEPARTMENT Provider Note   CSN: [id number] Arrival date & time: 02/17/22  1247     History  Chief Complaint  Patient presents with   Dizziness    Lauren Henry is a 81 y.o. female.   Dizziness Associated symptoms: weakness (Generalized)   Patient presenting for dizziness since yesterday.  Additional recent symptoms include loss of appetite over the past several weeks.  She has been able to tolerate small amounts of food.  She has been able to tolerate p.o. fluids.  She has had worsening generalized weakness.  She denies shortness of breath but her husband has noted some labored breathing recently.  Her medical history is notable for HTN, HLD, hypothyroidism, complete heart block s/p pacemaker, atrial fibrillation, arthritis, CKD, anemia.  She reports that her dizziness is worsened with sitting up or standing up.  She denies any current dizziness at rest.    Home Medications Prior to Admission medications   Medication Sig Start Date End Date Taking? Authorizing Provider  apixaban (ELIQUIS) 5 MG TABS tablet Take 1 tablet (5 mg total) by mouth 2 (two) times daily. 02/17/20   Skeet Latch, MD  carvedilol (COREG CR) 20 MG 24 hr capsule TAKE 1 CAPSULE(20 MG) BY MOUTH DAILY 10/17/21   Skeet Latch, MD  cloNIDine (CATAPRES) 0.1 MG tablet Take 0.05 mg by mouth 2 (two) times daily.    [provider]  ferrous sulfate 325 (65 FE) MG tablet Take 325 mg by mouth daily with breakfast.    [provider]  hydrALAZINE (APRESOLINE) 50 MG tablet TAKE ONE AND ONE-HALF TABLET BY MOUTH EVERY MORNING AND AT LUNCH. THEN TAKE 1 TABLET BY MOUTH EVERY EVENING 07/16/21   Skeet Latch, MD  levothyroxine (SYNTHROID, LEVOTHROID) 150 MCG tablet Take 150 mcg by mouth daily before breakfast.  10/15/17   [provider]  mirtazapine (REMERON) 7.5 MG tablet Take 7.5 mg by mouth at bedtime.  11/03/18   [provider]  Multiple  Vitamin (MULTIVITAMIN WITH MINERALS) TABS tablet Take 1 tablet by mouth daily.    [provider]  pantoprazole (PROTONIX) 40 MG tablet Take 40 mg by mouth daily. 11/10/18   [provider]  rosuvastatin (CRESTOR) 40 MG tablet TAKE 1 TABLET BY MOUTH DAILY 02/10/22   Skeet Latch, MD  spironolactone (ALDACTONE) 25 MG tablet Take 25 mg by mouth daily.    [provider]  vitamin C (ASCORBIC ACID) 250 MG tablet Take 250 mg by mouth daily.    [provider]  VITAMIN D, CHOLECALCIFEROL, PO Take 1 tablet by mouth daily.    [provider]      Allergies    Olmesartan and Amlodipine    Review of Systems   Review of Systems  Constitutional:  Positive for appetite change and fatigue.  Neurological:  Positive for dizziness and weakness (Generalized).   Physical Exam Updated Vital Signs BP 118/63 (BP Location: Right Arm)    Pulse 95    Temp 97.6 F (36.4 C) (Oral)    Resp 20    SpO2 97%  Physical Exam Vitals and nursing note reviewed.  Constitutional:      General: She is not in acute distress.    Appearance: Normal appearance. She is well-developed. She is not toxic-appearing or diaphoretic.  HENT:     Head: Normocephalic and atraumatic.     Right Ear: External ear normal.     Left Ear: External ear normal.  Nose: Nose normal.     Mouth/Throat:     Mouth: Mucous membranes are dry.     Pharynx: Oropharynx is clear.  Eyes:     Extraocular Movements: Extraocular movements intact.     Conjunctiva/sclera: Conjunctivae normal.  Cardiovascular:     Rate and Rhythm: Normal rate and regular rhythm.     Heart sounds: No murmur heard. Pulmonary:     Effort: Pulmonary effort is normal. No respiratory distress.     Breath sounds: Normal breath sounds. No wheezing or rales.  Chest:     Chest wall: No tenderness.  Abdominal:     Palpations: Abdomen is soft.     Tenderness: There is no abdominal tenderness.  Musculoskeletal:        General:  No swelling. Normal range of motion.     Cervical back: Normal range of motion and neck supple.  Skin:    General: Skin is warm and dry.     Capillary Refill: Capillary refill takes less than 2 seconds.  Neurological:     General: No focal deficit present.     Mental Status: She is alert and oriented to person, place, and time.     Cranial Nerves: No cranial nerve deficit.     Sensory: No sensory deficit.     Motor: No weakness.     Coordination: Coordination normal.  Psychiatric:        Mood and Affect: Mood normal.        Behavior: Behavior normal.        Thought Content: Thought content normal.        Judgment: Judgment normal.    ED Results / Procedures / Treatments   Labs (all labs ordered are listed, but only abnormal results are displayed) Labs Reviewed  BASIC METABOLIC PANEL - Abnormal; Notable for the following components:      Result Value   Sodium 133 (*)    Glucose, Bld 145 (*)    BUN 29 (*)    Creatinine, Ser 2.29 (*)    GFR, Estimated 21 (*)    All other components within normal limits  CBC - Abnormal; Notable for the following components:   WBC 1.4 (*)    RBC 3.60 (*)    Hemoglobin 10.6 (*)    HCT 32.3 (*)    All other components within normal limits  RESP PANEL BY RT-PCR (FLU A&B, COVID) [id number]  URINALYSIS, ROUTINE W REFLEX MICROSCOPIC  HEPATIC FUNCTION PANEL  DIFFERENTIAL  TSH  CBG MONITORING, ED    EKG EKG Interpretation  Date/Time:  Monday February 17 2022 13:04:15 EST Ventricular Rate:  97 PR Interval:  200 QRS Duration: 160 QT Interval:  402 QTC Calculation: 510 R Axis:   -63 Text Interpretation: Atrial-sensed ventricular-paced rhythm Abnormal ECG When compared with ECG of 23-Aug-2020 19:28, PREVIOUS ECG IS PRESENT Confirmed by Godfrey Pick ([id number]) on 02/17/2022 3:00:07 PM  Radiology DG Chest 2 View  Result Date: 02/17/2022 CLINICAL DATA:  Cough, generalized malaise since Saturday, dizziness since yesterday, loss of appetite; history  hypertension, atrial fibrillation EXAM: CHEST - 2 VIEW COMPARISON:  10/30/2018 FINDINGS: LEFT subclavian sequential transvenous pacemaker leads project at RIGHT atrium and RIGHT ventricle. Normal heart size, mediastinal contours, and pulmonary vascularity. Mitral annular calcification and atherosclerotic calcification aorta noted. Bronchitic changes without pulmonary infiltrate, pleural effusion, or pneumothorax. Osseous structures demineralized. IMPRESSION: Bronchitic changes without infiltrate. Aortic Atherosclerosis (ICD10-I70.0). Electronically Signed   By: Lavonia Dana M.D.   On: 02/17/2022 14:08  Procedures Procedures    Medications Ordered in ED Medications  lactated ringers bolus 500 mL (has no administration in time range)    ED Course/ Medical Decision Making/ A&P                           Medical Decision Making Amount and/or Complexity of Data Reviewed Labs: ordered.   This patient presents to the ED for concern of dizziness, this involves an extensive number of treatment options, and is a complaint that carries with it a high risk of complications and morbidity.  The differential diagnosis includes dehydration, pacemaker malfunction, CVA, infection, metabolic disturbances, CHF, orthostatic hypotension, depression   Co morbidities that complicate the patient evaluation  HTN, HLD, hypothyroidism, complete heart block s/p pacemaker, atrial fibrillation, arthritis, CKD, anemia   Additional history obtained:  Additional history obtained from patient's husband External records from outside source obtained and reviewed including EMR   Lab Tests:  I Ordered, and personally interpreted labs.  The pertinent results include: Unexplained leukopenia, AKI, (remaining labs pending at time of signout)   Imaging Studies ordered:  I ordered imaging studies including chest x-ray, CT head I independently visualized and interpreted imaging which showed no focal opacities or  pulmonary edema seen on chest x-ray.  (CT head pending at time of signout) I agree with the radiologist interpretation   Cardiac Monitoring:  The patient was maintained on a cardiac monitor.  I personally viewed and interpreted the cardiac monitored which showed an underlying rhythm of: Paced rhythm   Medicines ordered and prescription drug management:  I ordered medication including IV fluids for dehydration Reevaluation of the patient after these medicines showed that the patient  reassessment pending at time of signout I have reviewed the patients home medicines and have made adjustments as needed  Problem List / ED Course:  81 year old female presenting for dizziness since yesterday.  This is in the setting of recent decreased p.o. intake, fatigue and generalized weakness.  Prior to being bedded in the ED, lab work was initiated.  On CBC, patient has unexplained leukopenia.  Labs are otherwise notable for increased creatinine.  On initial assessment, patient resting comfortably.  She denies any dizziness at rest.  She has no focal neurologic deficits.  When going from a semirecumbent position to sitting up, patient has a 20 point drop in her SBP.  This is consistent with orthostatic hypotension.  Patient's history is consistent with dehydration given very little food intake for several days.  IV fluids were ordered.  Remaining lab work and reassessment pending at time of signout.  Care of patient was signed out to oncoming ED provider.   Reevaluation:  After the interventions noted above, I reevaluated the patient and found that they have : Reassessment pending at time of signout   Social Determinants of Health:  Lives independently with husband at home   Dispostion:  After consideration of the diagnostic results and the patients response to treatment, I feel that the patent would benefit from remaining work-up and reassessment.          Final Clinical Impression(s) / ED  Diagnoses Final diagnoses:  Dizziness  Generalized weakness  Other fatigue    Rx / DC Orders ED Discharge Orders     None         Godfrey Pick, MD 02/17/22 [id number]

## 2022-02-17 NOTE — ED Notes (Signed)
WBC 1.2 critical called by lab and reported to [facility] MD

## 2022-02-17 NOTE — ED Notes (Signed)
Patient's pacemaker interrogated at this time. Data successfully sent.

## 2022-02-18 DIAGNOSIS — Z96653 Presence of artificial knee joint, bilateral: Secondary | ICD-10-CM | POA: Diagnosis present

## 2022-02-18 DIAGNOSIS — N1832 Chronic kidney disease, stage 3b: Secondary | ICD-10-CM | POA: Diagnosis present

## 2022-02-18 DIAGNOSIS — I480 Paroxysmal atrial fibrillation: Secondary | ICD-10-CM | POA: Diagnosis present

## 2022-02-18 DIAGNOSIS — I442 Atrioventricular block, complete: Secondary | ICD-10-CM | POA: Diagnosis present

## 2022-02-18 DIAGNOSIS — E785 Hyperlipidemia, unspecified: Secondary | ICD-10-CM | POA: Diagnosis present

## 2022-02-18 DIAGNOSIS — E039 Hypothyroidism, unspecified: Secondary | ICD-10-CM | POA: Diagnosis present

## 2022-02-18 DIAGNOSIS — E1122 Type 2 diabetes mellitus with diabetic chronic kidney disease: Secondary | ICD-10-CM | POA: Diagnosis present

## 2022-02-18 DIAGNOSIS — N12 Tubulo-interstitial nephritis, not specified as acute or chronic: Secondary | ICD-10-CM | POA: Diagnosis not present

## 2022-02-18 DIAGNOSIS — N3000 Acute cystitis without hematuria: Secondary | ICD-10-CM | POA: Diagnosis not present

## 2022-02-18 DIAGNOSIS — Z7901 Long term (current) use of anticoagulants: Secondary | ICD-10-CM | POA: Diagnosis not present

## 2022-02-18 DIAGNOSIS — I6529 Occlusion and stenosis of unspecified carotid artery: Secondary | ICD-10-CM | POA: Diagnosis not present

## 2022-02-18 DIAGNOSIS — Z79890 Hormone replacement therapy (postmenopausal): Secondary | ICD-10-CM | POA: Diagnosis not present

## 2022-02-18 DIAGNOSIS — Z888 Allergy status to other drugs, medicaments and biological substances status: Secondary | ICD-10-CM | POA: Diagnosis not present

## 2022-02-18 DIAGNOSIS — I130 Hypertensive heart and chronic kidney disease with heart failure and stage 1 through stage 4 chronic kidney disease, or unspecified chronic kidney disease: Secondary | ICD-10-CM | POA: Diagnosis present

## 2022-02-18 DIAGNOSIS — Z8249 Family history of ischemic heart disease and other diseases of the circulatory system: Secondary | ICD-10-CM | POA: Diagnosis not present

## 2022-02-18 DIAGNOSIS — R030 Elevated blood-pressure reading, without diagnosis of hypertension: Secondary | ICD-10-CM | POA: Diagnosis not present

## 2022-02-18 DIAGNOSIS — R42 Dizziness and giddiness: Secondary | ICD-10-CM | POA: Diagnosis not present

## 2022-02-18 DIAGNOSIS — R55 Syncope and collapse: Secondary | ICD-10-CM

## 2022-02-18 DIAGNOSIS — I10 Essential (primary) hypertension: Secondary | ICD-10-CM | POA: Diagnosis not present

## 2022-02-18 DIAGNOSIS — I2510 Atherosclerotic heart disease of native coronary artery without angina pectoris: Secondary | ICD-10-CM | POA: Diagnosis present

## 2022-02-18 DIAGNOSIS — N179 Acute kidney failure, unspecified: Secondary | ICD-10-CM | POA: Diagnosis present

## 2022-02-18 DIAGNOSIS — Z79899 Other long term (current) drug therapy: Secondary | ICD-10-CM | POA: Diagnosis not present

## 2022-02-18 DIAGNOSIS — I5032 Chronic diastolic (congestive) heart failure: Secondary | ICD-10-CM | POA: Diagnosis present

## 2022-02-18 DIAGNOSIS — M1990 Unspecified osteoarthritis, unspecified site: Secondary | ICD-10-CM | POA: Diagnosis present

## 2022-02-18 DIAGNOSIS — N183 Chronic kidney disease, stage 3 unspecified: Secondary | ICD-10-CM | POA: Diagnosis not present

## 2022-02-18 DIAGNOSIS — N390 Urinary tract infection, site not specified: Secondary | ICD-10-CM | POA: Diagnosis present

## 2022-02-18 DIAGNOSIS — R531 Weakness: Secondary | ICD-10-CM | POA: Diagnosis not present

## 2022-02-18 DIAGNOSIS — Z950 Presence of cardiac pacemaker: Secondary | ICD-10-CM | POA: Diagnosis not present

## 2022-02-18 DIAGNOSIS — Z823 Family history of stroke: Secondary | ICD-10-CM | POA: Diagnosis not present

## 2022-02-18 DIAGNOSIS — R0902 Hypoxemia: Secondary | ICD-10-CM | POA: Diagnosis present

## 2022-02-18 DIAGNOSIS — D631 Anemia in chronic kidney disease: Secondary | ICD-10-CM | POA: Diagnosis present

## 2022-02-18 DIAGNOSIS — I951 Orthostatic hypotension: Secondary | ICD-10-CM | POA: Diagnosis present

## 2022-02-18 DIAGNOSIS — Z20822 Contact with and (suspected) exposure to covid-19: Secondary | ICD-10-CM | POA: Diagnosis present

## 2022-02-18 LAB — COMPREHENSIVE METABOLIC PANEL
ALT: 48 U/L — ABNORMAL HIGH (ref 0–44)
AST: 43 U/L — ABNORMAL HIGH (ref 15–41)
Albumin: 2.6 g/dL — ABNORMAL LOW (ref 3.5–5.0)
Alkaline Phosphatase: 39 U/L (ref 38–126)
Anion gap: 9 (ref 5–15)
BUN: 25 mg/dL — ABNORMAL HIGH (ref 8–23)
CO2: 22 mmol/L (ref 22–32)
Calcium: 9.6 mg/dL (ref 8.9–10.3)
Chloride: 102 mmol/L (ref 98–111)
Creatinine, Ser: 2.11 mg/dL — ABNORMAL HIGH (ref 0.44–1.00)
GFR, Estimated: 23 mL/min — ABNORMAL LOW (ref >60)
Glucose, Bld: 93 mg/dL (ref 70–99)
Potassium: 3.8 mmol/L (ref 3.5–5.1)
Sodium: 133 mmol/L — ABNORMAL LOW (ref 135–145)
Total Bilirubin: 0.8 mg/dL (ref 0.3–1.2)
Total Protein: 7 g/dL (ref 6.5–8.1)

## 2022-02-18 LAB — CBC
HCT: 28.2 % — ABNORMAL LOW (ref 36.0–46.0)
Hemoglobin: 9.5 g/dL — ABNORMAL LOW (ref 12.0–15.0)
MCH: 29.5 pg (ref 26.0–34.0)
MCHC: 33.7 g/dL (ref 30.0–36.0)
MCV: 87.6 fL (ref 80.0–100.0)
Platelets: 148 10*3/uL — ABNORMAL LOW (ref 150–400)
RBC: 3.22 MIL/uL — ABNORMAL LOW (ref 3.87–5.11)
RDW: 13.3 % (ref 11.5–15.5)
WBC: 1.5 10*3/uL — ABNORMAL LOW (ref 4.0–10.5)
nRBC: 0 % (ref 0.0–0.2)

## 2022-02-18 LAB — RESPIRATORY PANEL BY PCR

## 2022-02-18 LAB — PATHOLOGIST SMEAR REVIEW: Path Review: NEGATIVE

## 2022-02-18 MED ORDER — APIXABAN 2.5 MG PO TABS: 2.5000 mg | ORAL_TABLET | Freq: Two times a day (BID) | ORAL | 0 refills | Status: DC

## 2022-02-18 NOTE — Evaluation (Signed)
Occupational Therapy Evaluation Patient Details Name: Lauren Henry MRN: [id number] DOB: [birth year] Today's Date: 02/18/2022   History of Present Illness Lauren Henry is a 81 y.o. female with medical history significant of low back pain, complete heart block status post pacemaker, atrial fibrillation, hypothyroidism, hyperlipidemia, hypertension, gastroenteropathy, CKD 3, CAD, anemia presenting with ongoing dizziness and weakness over the last several days..   Clinical Impression    Pt was admitted as above and seen for OT assessment/ADL retraining session today. She was min guard assist for initial sit to stand from EOB. She ambulated to the bathroom with supervision. She was supervision for grooming, UB bathing/dressing and LB bathing (cleaning her legs down to her feet) standing at sink and supervision for all other transfers. Pt was w/o c/o dizziness throughout the session. Pt was educated in energy conservation techniques, set up for ADL's etc as she continues to gain strength. Pt appears to be at or near baseline level per her report. She reports husband and son can assist PRN at d/c and denies any further needs at this time. Recommend supervision-Min guard/PRN assist at home. Will sign off OT     Recommendations for follow up therapy are one component of a multi-disciplinary discharge planning process, led by the attending physician.  Recommendations may be updated based on patient status, additional functional criteria and insurance authorization.   Follow Up Recommendations  No OT follow up    Assistance Recommended at Discharge Intermittent Supervision/Assistance  Patient can return home with the following A little help with walking and/or transfers;A little help with bathing/dressing/bathroom;Assistance with cooking/housework    Functional Status Assessment  Patient has not had a recent decline in their functional status  Equipment Recommendations  None recommended by OT     Recommendations for Other Services PT consult     Precautions / Restrictions Precautions Precautions: Fall      Mobility Bed Mobility Overal bed mobility: Modified Independent             General bed mobility comments: No hands on assist required    Transfers Overall transfer level: Needs assistance Equipment used: Rolling walker (2 wheels) (Ambulates with SPC at home per pt report) Transfers: Sit to/from Stand Sit to Stand: Min guard           General transfer comment: Min guard assist with initial sit to stand from EOB then close supervision for safety      Balance Overall balance assessment: Mild deficits observed, not formally tested         ADL either performed or assessed with clinical judgement   ADL Overall ADL's : Needs assistance/impaired Eating/Feeding: Modified independent;Sitting;Bed level   Grooming: Wash/dry hands;Wash/dry face;Oral care;Brushing hair;Supervision/safety;Standing   Upper Body Bathing: Set up;Sitting;Standing   Lower Body Bathing: Supervison/ safety;Sitting/lateral leans;Sit to/from stand   Upper Body Dressing : Set up;Sitting   Lower Body Dressing: Modified independent;Set up;Sitting/lateral leans;Sit to/from stand   Toilet Transfer: Supervision/safety;Rolling walker (2 wheels);BSC/3in1 Toilet Transfer Details (indicate cue type and reason): 3:1 placed over toilet in bathroom. Pt urinated with initial sit to stand from EOB and amb to bathroom and continued to urinate on 3:1 over toilet. Pt was Mod I hygeine and washing up at sink afterward. Used grab for sit to stand from 3:1 stated that she has grab bars at home as well. Toileting- Clothing Manipulation and Hygiene: Modified independent;Sitting/lateral lean       Functional mobility during ADLs: Min guard;Supervision/safety;Rolling walker (2 wheels);Cueing for sequencing (Min guard  with initial sit to stand from EOB, then close supervision.) General ADL Comments: Pt was  seen for OT assessment and ADL retraining session today. She was min guard assist for initial sit to stand from EOB and was noted to urinate on the floor. She ambulated ti the bathroom and continued to urinate on 3:1 placed over the toilet. She was supervision for grooming, UB bathing/dressing and LB bathing (cleaning her legs down to her feet) standing at sink. Pt was w/o c/o dizziness throughout the session. Pt was educated in energy conservation techniques, set up for ADL's etc as she continues to gain strength. Pt reports husband and son can assist PRN at d/c and denies any further needs at this time. Recommend supervision-Min guard assist at home. Will sign off OT.     Vision Baseline Vision/History: 1 Wears glasses (Does not have glasses with her) Patient Visual Report: No change from baseline              Pertinent Vitals/Pain Pain Assessment Pain Assessment: No/denies pain     Hand Dominance Right   Extremity/Trunk Assessment Upper Extremity Assessment Upper Extremity Assessment: Overall WFL for tasks assessed   Lower Extremity Assessment Lower Extremity Assessment: Defer to PT evaluation       Communication Communication Communication: No difficulties   Cognition Arousal/Alertness: Awake/alert Behavior During Therapy: WFL for tasks assessed/performed Overall Cognitive Status: Impaired/Different from baseline Area of Impairment: Orientation     Orientation Level: Place     General Comments: Pt is A & O to person, situation, month, date, year  but not place stating "I am not sure where I am" oriented to [facility]. No family present to determine if this is her baseline level. Will continue to assess in functional situation     General Comments  Pt IV with noted bleeding around right arm/site. RN staff notified and aware        Home Living Family/patient expects to be discharged to:: Private residence Living Arrangements: Spouse/significant  other Available Help at Discharge: Family;Available PRN/intermittently Type of Home: House Home Access: Ramped entrance   Entrance Stairs-Rails: Can reach both Home Layout: One level     Bathroom Shower/Tub: Producer, television/film/video: Handicapped height Bathroom Accessibility: Yes How Accessible: Accessible via walker Home Equipment: Rolling Walker (2 wheels);Cane - single point;Grab bars - toilet;Grab bars - tub/shower;Hand held shower head          Prior Functioning/Environment Prior Level of Function : Independent/Modified Independent       Mobility Comments: Pt reports that she uses a SPC for ambulation prior to arrival. She also has a RW. Lives with her husband and has a son nearby ADLs Comments: Pt reports that she is Mod I with ADL's (meal prep, light housekeeping). Lives with her husband and has a son nearby whom checks on them per her report.        OT Problem List:  Pt appears to be at or near baseline level. H/o Generalized weakness      OT Treatment/Interventions:   Pt education, ADL retraining session, energy conservation    OT Goals(Current goals can be found in the care plan section) Acute Rehab OT Goals Patient Stated Goal: "Go home later today" OT Goal Formulation: All assessment and education complete, DC therapy   OT Frequency:  Eval/treatment  only     AM-PAC OT "6 Clicks" Daily Activity     Outcome Measure Help from another person eating meals?: None Help  from another person taking care of personal grooming?: A Little Help from another person toileting, which includes using toliet, bedpan, or urinal?: A Little Help from another person bathing (including washing, rinsing, drying)?: A Little Help from another person to put on and taking off regular upper body clothing?: None Help from another person to put on and taking off regular lower body clothing?: A Little 6 Click Score: 20   End of Session Equipment Utilized During Treatment: Gait  belt;Rolling walker (2 wheels) Nurse Communication: Mobility status;Other (comment) (Pt urinated on floor with stand from EOB and IV site with bleeding R arm.)  Activity Tolerance: Patient tolerated treatment well Patient left: in chair;with call bell/phone within reach  OT Visit Diagnosis: Muscle weakness (generalized) (M62.81)                Time: 0812-0909 OT Time Calculation (min): 57 min Charges:  OT General Charges $OT Visit: 1 Visit OT Evaluation $OT Eval Low Complexity: 1 Low OT Treatments $Self Care/Home Management : 23-37 mins Alm Bustard, OTR/L 02/18/2022, 9:33 AM

## 2022-02-18 NOTE — Progress Notes (Signed)
DISCHARGE NOTE HOME NOVEMBER SYPHER to be discharged Home per MD order. Discussed prescriptions and follow up appointments with the patient. Prescriptions given to patient; medication list explained in detail. Patient verbalized understanding.  Skin clean, dry and intact without evidence of skin break down, no evidence of skin tears noted. IV catheter discontinued intact. Site without signs and symptoms of complications. Dressing and pressure applied. Pt denies pain at the site currently. No complaints noted.  Patient free of lines, drains, and wounds.   An After Visit Summary (AVS) was printed and given to the patient. Patient escorted via wheelchair, and discharged home via private auto.  Inari Shin S Jaree Trinka, RN

## 2022-02-18 NOTE — Progress Notes (Signed)
New Admission Note:   Arrival Method: Arrived from [facility] ED via stretcher Mental Orientation: Alert and oriented x4 Telemetry: Box# 21 Assessment: Completed Skin: intact IV: Rt AC Pain: 0/10 Tubes: N/A Safety Measures: Safety Fall Prevention Plan has been discussed.  Admission: Completed Orientation: Patient has been oriented to the room, unit and staff.  Family: None at bedside  Orders have been reviewed and implemented. Will continue to monitor the patient. Call light has been placed within reach and bed alarm has been activated.   Augustus Zurawski [employer], RN-BC Phone number: [phone]

## 2022-02-18 NOTE — Evaluation (Signed)
Physical Therapy Evaluation Patient Details Name: Lauren Henry MRN: [id number]:[id number] DOB: [birth year] Today's Date: 02/18/2022  History of Present Illness  Lauren Henry is a 81 y.o. female with medical history significant of low back pain, complete heart block status post pacemaker, atrial fibrillation, hypothyroidism, hyperlipidemia, hypertension, gastroenteropathy, CKD 3, CAD, anemia presenting with ongoing dizziness and weakness over the last several days..  Clinical Impression   Pt admitted with above diagnosis. Lives at home with her husband, in a single-level home with a few steps to enter; Prior to admission, pt was able to managed independently, occasionally using cane for amb (rarely uses her RW); Presents to PT with generalized weakness, soft BPs with activity, but they did not show a sharp drop with incr time in standing and upright activity;  Overall needs Supervision/minguard for coming to stand and walking; Pt currently with functional limitations due to the deficits listed below (see PT Problem List). Pt will benefit from skilled PT to increase their independence and safety with mobility to allow discharge to the venue listed below.    SATURATION QUALIFICATIONS: (This note is used to comply with regulatory documentation for home oxygen)  Patient Saturations on Room Air at Rest = 97%  Patient Saturations on Room Air while Ambulating = 95%  Patient does not need supplemental oxygen to maintain oxygen saturations at acceptable, safe levels with physical activity.     02/18/22 0945 02/18/22 0950 02/18/22 1000  Orthostatic Standing with ambulation/standing activity  BP- Standing  114/75 (MAP 86) 109/66 (MAP 77) 104/74 (MAP 84)  Pulse- Standing  106 (Standing after short hallway walk) 109 (Standing after longer hallway walk (no seated rest break)) 106 (Standing, after further walking; no seated rest breaks; Duration of standingand upright activities > 15 minutes)         Recommendations for follow up therapy are one component of a multi-disciplinary discharge planning process, led by the attending physician.  Recommendations may be updated based on patient status, additional functional criteria and insurance authorization.  Follow Up Recommendations Home health PT    Assistance Recommended at Discharge Set up Supervision/Assistance  Patient can return home with the following  Assistance with cooking/housework    Equipment Recommendations None recommended by PT  Recommendations for Other Services       Functional Status Assessment Patient has had a recent decline in their functional status and demonstrates the ability to make significant improvements in function in a reasonable and predictable amount of time.     Precautions / Restrictions Precautions Precautions: Fall      Mobility  Bed Mobility                    Transfers Overall transfer level: Needs assistance Equipment used: Rolling walker (2 wheels)   Sit to Stand: Min guard           General transfer comment: Min guard assist with initial sit to stand from EOB then close supervision for safety; very slow rise    Ambulation/Gait Ambulation/Gait assistance: Min guard (with and without physical contact ) Gait Distance (Feet): 180 Feet Assistive device: Rolling walker (2 wheels) (Hallway rail, pushed vitals machine) Gait Pattern/deviations: Step-through pattern, Decreased step length - right, Decreased step length - left       General Gait Details: Slow steps, with no overt loss of balance throughout walk; switched from RW to using hallway rail and noted small increase in step length and gait speed; Still, she does tend to reach our  for UE support; Cues to self-monitor for activity tolerance   Stairs            Wheelchair Mobility    Modified Rankin (Stroke Patients Only)       Balance Overall balance assessment: Mild deficits observed, not formally  tested                                           Pertinent Vitals/Pain Pain Assessment Pain Assessment: No/denies pain    Home Living Family/patient expects to be discharged to:: Private residence Living Arrangements: Spouse/significant other Available Help at Discharge: Family;Available PRN/intermittently Type of Home: House Home Access: Ramped entrance Entrance Stairs-Rails: Can reach both     Home Layout: One level Home Equipment: Conservation officer, nature (2 wheels);Cane - single point;Grab bars - toilet;Grab bars - tub/shower;Hand held shower head      Prior Function Prior Level of Function : Independent/Modified Independent             Mobility Comments: Pt reports that she uses a SPC for ambulation prior to arrival. She also has a RW. Lives with her husband and has a son nearby ADLs Comments: Pt reports that she is Mod I with ADL's (meal prep, light housekeeping). Lives with her husband and has a son nearby whom checks on them per her report.     Hand Dominance   Dominant Hand: Right    Extremity/Trunk Assessment   Upper Extremity Assessment Upper Extremity Assessment: Defer to OT evaluation    Lower Extremity Assessment Lower Extremity Assessment: Generalized weakness (with slow rise to stand)       Communication   Communication: No difficulties  Cognition Arousal/Alertness: Awake/alert Behavior During Therapy: WFL for tasks assessed/performed Overall Cognitive Status: Impaired/Different from baseline Area of Impairment: Orientation                 Orientation Level: Place             General Comments: Pt is A & O to person, situation, month, date, year  but not place stating "I am not sure where I am" oriented to [facility]. No family present to determine if this is her baseline level. Will continue to assess in functional situation        General Comments General comments (skin integrity, edema, etc.): Pt IV with noted  bleeding around right arm/site. RN staff notified and aware    Exercises     Assessment/Plan    PT Assessment Patient needs continued PT services  PT Problem List Decreased strength;Decreased activity tolerance;Decreased balance;Decreased knowledge of use of DME;Decreased knowledge of precautions       PT Treatment Interventions DME instruction;Gait training;Stair training;Functional mobility training;Therapeutic activities;Therapeutic exercise;Balance training;Patient/family education    PT Goals (Current goals can be found in the Care Plan section)  Acute Rehab PT Goals Patient Stated Goal: Home soon PT Goal Formulation: With patient Time For Goal Achievement: 03/04/22 Potential to Achieve Goals: Good    Frequency Min 3X/week     Co-evaluation               AM-PAC PT "6 Clicks" Mobility  Outcome Measure Help needed turning from your back to your side while in a flat bed without using bedrails?: None Help needed moving from lying on your back to sitting on the side of a flat bed without using bedrails?: None Help  needed moving to and from a bed to a chair (including a wheelchair)?: A Little Help needed standing up from a chair using your arms (e.g., wheelchair or bedside chair)?: A Little Help needed to walk in hospital room?: A Little Help needed climbing 3-5 steps with a railing? : A Lot 6 Click Score: 19    End of Session Equipment Utilized During Treatment: Gait belt Activity Tolerance: Patient tolerated treatment well Patient left: in chair;with call bell/phone within reach Nurse Communication: Mobility status PT Visit Diagnosis: Muscle weakness (generalized) (M62.81);Other abnormalities of gait and mobility (R26.89)    Time: 0940-1000 PT Time Calculation (min) (ACUTE ONLY): 20 min   Charges:   PT Evaluation $PT Eval Moderate Complexity: 1 Mod          Roney [place], [place]  Acute Rehabilitation Services Pager [phone] Office [phone]   Colletta [place] 02/18/2022, 10:24 AM

## 2022-02-18 NOTE — Plan of Care (Signed)
  Problem: Education: Goal: Knowledge of General Education information will improve Description Including pain rating scale, medication(s)/side effects and non-pharmacologic comfort measures Outcome: Progressing   

## 2022-02-18 NOTE — Care Management Obs Status (Signed)
MEDICARE OBSERVATION STATUS NOTIFICATION   Patient Details  Name: KAYTLYNNE NEACE MRN: [id number] Date of Birth: 1941/06/15   Medicare Observation Status Notification Given:  Yes    Tom-Johnson, Hershal Coria, RN 02/18/2022, 4:03 PM

## 2022-02-18 NOTE — Discharge Summary (Signed)
Physician Discharge Summary  Lauren Henry [id number] DOB: 1941-05-21 DOA: 02/17/2022  PCP: Haywood Pao, MD  Admit date: 02/17/2022 Discharge date: 02/18/2022  Admitted From: Home Disposition: Home  Recommendations for Outpatient Follow-up:  Follow up with PCP in 1-2 weeks Please obtain BMP/CBC in one week  Home Health: PT Equipment/Devices: None  Discharge Condition: Stable CODE STATUS: Full Diet recommendation: As tolerated  Brief/Interim Summary: Lauren Henry is a 81 y.o. female with medical history significant of low back pain, complete heart block status post pacemaker, atrial fibrillation, hypothyroidism, hyperlipidemia, hypertension, gastroenteropathy, CKD 3, CAD, anemia presenting with ongoing dizziness and weakness and reports weeks of poor PO intake.  With IV fluids supportive care patient's symptoms have essentially resolved at this point, no longer orthostatic, ambulating without any difficulty today with physical therapy.  PT does recommend ongoing home health PT given baseline debility, likely subacute to chronic at this point but otherwise patient stable and agreeable for discharge home.  Lengthy discussion about need for improved p.o. intake and close follow-up with PCP in the next 1 to 2 weeks as scheduled.  Discharge Diagnoses:  Principal Problem:   Postural dizziness with presyncope Active Problems:   Essential hypertension   Hyperlipidemia   Hypothyroidism   PAF (paroxysmal atrial fibrillation) (HCC)   CAD in native artery   Anemia   Gastroenteropathy   Acute renal failure superimposed on stage 3 chronic kidney disease (HCC)   Weakness   Orthostatic hypotension    Discharge Instructions  Discharge Instructions     Discharge patient   Complete by: As directed    Discharge disposition: 06-Home-Health Care Svc   Discharge patient date: 02/18/2022      Allergies as of 02/18/2022       Reactions   Olmesartan    enteritis   Amlodipine  Swelling, Rash        Medication List     STOP taking these medications    cloNIDine 0.1 MG tablet Commonly known as: CATAPRES   hydrALAZINE 50 MG tablet Commonly known as: APRESOLINE   spironolactone 25 MG tablet Commonly known as: ALDACTONE       TAKE these medications    apixaban 5 MG Tabs tablet Commonly known as: Eliquis Take 1 tablet (5 mg total) by mouth 2 (two) times daily.   carvedilol 20 MG 24 hr capsule Commonly known as: COREG CR TAKE 1 CAPSULE(20 MG) BY MOUTH DAILY   ferrous sulfate 325 (65 FE) MG tablet Take 325 mg by mouth daily with breakfast.   levothyroxine 150 MCG tablet Commonly known as: SYNTHROID Take 150 mcg by mouth daily before breakfast.   mirtazapine 7.5 MG tablet Commonly known as: REMERON Take 7.5 mg by mouth at bedtime.   multivitamin with minerals Tabs tablet Take 1 tablet by mouth daily.   pantoprazole 40 MG tablet Commonly known as: PROTONIX Take 40 mg by mouth daily.   rosuvastatin 40 MG tablet Commonly known as: CRESTOR TAKE 1 TABLET BY MOUTH DAILY   vitamin C 250 MG tablet Commonly known as: ASCORBIC ACID Take 250 mg by mouth daily.   VITAMIN D (CHOLECALCIFEROL) PO Take 1 tablet by mouth daily.        Allergies  Allergen Reactions   Olmesartan     enteritis   Amlodipine Swelling and Rash    Consultations: None  Procedures/Studies: DG Chest 2 View  Result Date: 02/17/2022 CLINICAL DATA:  Cough, generalized malaise since Saturday, dizziness since yesterday, loss of appetite; history hypertension,  atrial fibrillation EXAM: CHEST - 2 VIEW COMPARISON:  10/30/2018 FINDINGS: LEFT subclavian sequential transvenous pacemaker leads project at RIGHT atrium and RIGHT ventricle. Normal heart size, mediastinal contours, and pulmonary vascularity. Mitral annular calcification and atherosclerotic calcification aorta noted. Bronchitic changes without pulmonary infiltrate, pleural effusion, or pneumothorax. Osseous  structures demineralized. IMPRESSION: Bronchitic changes without infiltrate. Aortic Atherosclerosis (ICD10-I70.0). Electronically Signed   By: Lavonia Dana M.D.   On: 02/17/2022 14:08   CT Head Wo Contrast  Result Date: 02/17/2022 CLINICAL DATA:  Dizziness.  Weakness.  General malaise. EXAM: CT HEAD WITHOUT CONTRAST TECHNIQUE: Contiguous axial images were obtained from the base of the skull through the vertex without intravenous contrast. RADIATION DOSE REDUCTION: This exam was performed according to the departmental dose-optimization program which includes automated exposure control, adjustment of the mA and/or kV according to patient size and/or use of iterative reconstruction technique. COMPARISON:  05/26/2020 FINDINGS: Brain: No evidence of acute infarction, hemorrhage, hydrocephalus, extra-axial collection or mass lesion/mass effect. Vascular: No hyperdense vessel or unexpected calcification. Skull: Normal. Negative for fracture or focal lesion. Sinuses/Orbits: There is chronic mucoperiosteal thickening with partially calcified in septated mucous is associated with the right maxillary sinus. Mild mucosal thickening is noted within the left maxillary sinus. Other: None. IMPRESSION: 1. No acute intracranial abnormalities. 2. Chronic bilateral maxillary sinus inflammation. Electronically Signed   By: Kerby Moors M.D.   On: 02/17/2022 15:58   CUP PACEART REMOTE DEVICE CHECK  Result Date: 02/04/2022 Scheduled remote reviewed. Normal device function.  Short AMS, longest 2 min Next remote 91 days- JJB    Subjective: No acute issues or events overnight, symptoms have resolved at this point after fluid administration and increased p.o. intake   Discharge Exam: Vitals:   02/18/22 0926 02/18/22 0942  BP: 98/73 128/79  Pulse: 79 73  Resp: 19 19  Temp: 98 F (36.7 C) 98 F (36.7 C)  SpO2: 94%    Vitals:   02/18/22 0023 02/18/22 0436 02/18/22 0926 02/18/22 0942  BP: 122/88 130/84 98/73 128/79   Pulse: 67 73 79 73  Resp: 20 18 19 19   Temp: 99.2 F (37.3 C) 99.4 F (37.4 C) 98 F (36.7 C) 98 F (36.7 C)  TempSrc:    Oral  SpO2: 95% 95% 94%   Weight:      Height:        General: Pt is alert, awake, not in acute distress Cardiovascular: RRR, S1/S2 +, no rubs, no gallops Respiratory: CTA bilaterally, no wheezing, no rhonchi Abdominal: Soft, NT, ND, bowel sounds + Extremities: no edema, no cyanosis    The results of significant diagnostics from this hospitalization (including imaging, microbiology, ancillary and laboratory) are listed below for reference.     Microbiology: Recent Results (from the past 240 hour(s))  Resp Panel by RT-PCR (Flu A&B, Covid) Nasopharyngeal Swab     Status: None   Collection Time: 02/17/22 12:47 PM   Specimen: Nasopharyngeal Swab; Nasopharyngeal(NP) swabs in vial transport medium  Result Value Ref Range Status   SARS Coronavirus 2 by RT PCR NEGATIVE NEGATIVE Final    Comment: (NOTE) SARS-CoV-2 target nucleic acids are NOT DETECTED.  The SARS-CoV-2 RNA is generally detectable in upper respiratory specimens during the acute phase of infection. The lowest concentration of SARS-CoV-2 viral copies this assay can detect is 138 copies/mL. A negative result does not preclude SARS-Cov-2 infection and should not be used as the sole basis for treatment or other patient management decisions. A negative result may occur with  improper specimen collection/handling, submission of specimen other than nasopharyngeal swab, presence of viral mutation(s) within the areas targeted by this assay, and inadequate number of viral copies(<138 copies/mL). A negative result must be combined with clinical observations, patient history, and epidemiological information. The expected result is Negative.  Fact Sheet for Patients:  EntrepreneurPulse.com.au  Fact Sheet for Healthcare Providers:  IncredibleEmployment.be  This test  is no t yet approved or cleared by the [place] FDA and  has been authorized for detection and/or diagnosis of SARS-CoV-2 by FDA under an Emergency Use Authorization (EUA). This EUA will remain  in effect (meaning this test can be used) for the duration of the COVID-19 declaration under Section 564(b)(1) of the Act, 21 U.S.C.section 360bbb-3(b)(1), unless the authorization is terminated  or revoked sooner.       Influenza A by PCR NEGATIVE NEGATIVE Final   Influenza B by PCR NEGATIVE NEGATIVE Final    Comment: (NOTE) The Xpert Xpress SARS-CoV-2/FLU/RSV plus assay is intended as an aid in the diagnosis of influenza from Nasopharyngeal swab specimens and should not be used as a sole basis for treatment. Nasal washings and aspirates are unacceptable for Xpert Xpress SARS-CoV-2/FLU/RSV testing.  Fact Sheet for Patients: EntrepreneurPulse.com.au  Fact Sheet for Healthcare Providers: IncredibleEmployment.be  This test is not yet approved or cleared by the [place] FDA and has been authorized for detection and/or diagnosis of SARS-CoV-2 by FDA under an Emergency Use Authorization (EUA). This EUA will remain in effect (meaning this test can be used) for the duration of the COVID-19 declaration under Section 564(b)(1) of the Act, 21 U.S.C. section 360bbb-3(b)(1), unless the authorization is terminated or revoked.  Performed at [facility], [place] [address], [place], [place] [address]      Labs: BNP (last 3 results) No results for input(s): BNP in the last 8760 hours. Basic Metabolic Panel: Recent Labs  Lab 02/17/22 1315 02/18/22 0422  NA 133* 133*  K 3.9 3.8  CL 100 102  CO2 22 22  GLUCOSE 145* 93  BUN 29* 25*  CREATININE 2.29* 2.11*  CALCIUM 9.9 9.6   Liver Function Tests: Recent Labs  Lab 02/17/22 1520 02/18/22 0422  AST 52* 43*  ALT 58* 48*  ALKPHOS 49 39  BILITOT 0.6 0.8  PROT 7.6 7.0  ALBUMIN 3.0* 2.6*    No results for input(s): LIPASE, AMYLASE in the last 168 hours. No results for input(s): AMMONIA in the last 168 hours. CBC: Recent Labs  Lab 02/17/22 1315 02/17/22 1433 02/18/22 0422  WBC 1.4* 1.4* 1.5*  NEUTROABS  --  0.9*  --   HGB 10.6* 10.7* 9.5*  HCT 32.3* 32.0* 28.2*  MCV 89.7 89.1 87.6  PLT 157 162 148*   Cardiac Enzymes: No results for input(s): CKTOTAL, CKMB, CKMBINDEX, TROPONINI in the last 168 hours. BNP: Invalid input(s): POCBNP CBG: No results for input(s): GLUCAP in the last 168 hours. D-Dimer No results for input(s): DDIMER in the last 72 hours. Hgb A1c No results for input(s): HGBA1C in the last 72 hours. Lipid Profile No results for input(s): CHOL, HDL, LDLCALC, TRIG, CHOLHDL, LDLDIRECT in the last 72 hours. Thyroid function studies Recent Labs    02/17/22 1520  TSH 8.333*   Anemia work up No results for input(s): VITAMINB12, FOLATE, FERRITIN, TIBC, IRON, RETICCTPCT in the last 72 hours. Urinalysis    Component Value Date/Time   COLORURINE AMBER (A) 08/24/2020 0140   APPEARANCEUR HAZY (A) 08/24/2020 0140   LABSPEC 1.018 08/24/2020 0140  PHURINE 5.0 08/24/2020 0140   GLUCOSEU NEGATIVE 08/24/2020 0140   HGBUR NEGATIVE 08/24/2020 0140   BILIRUBINUR NEGATIVE 08/24/2020 0140   KETONESUR 5 (A) 08/24/2020 0140   PROTEINUR 100 (A) 08/24/2020 0140   UROBILINOGEN 0.2 10/26/2015 0957   NITRITE NEGATIVE 08/24/2020 0140   LEUKOCYTESUR NEGATIVE 08/24/2020 0140   Sepsis Labs Invalid input(s): PROCALCITONIN,  WBC,  LACTICIDVEN Microbiology Recent Results (from the past 240 hour(s))  Resp Panel by RT-PCR (Flu A&B, Covid) Nasopharyngeal Swab     Status: None   Collection Time: 02/17/22 12:47 PM   Specimen: Nasopharyngeal Swab; Nasopharyngeal(NP) swabs in vial transport medium  Result Value Ref Range Status   SARS Coronavirus 2 by RT PCR NEGATIVE NEGATIVE Final    Comment: (NOTE) SARS-CoV-2 target nucleic acids are NOT DETECTED.  The SARS-CoV-2 RNA  is generally detectable in upper respiratory specimens during the acute phase of infection. The lowest concentration of SARS-CoV-2 viral copies this assay can detect is 138 copies/mL. A negative result does not preclude SARS-Cov-2 infection and should not be used as the sole basis for treatment or other patient management decisions. A negative result may occur with  improper specimen collection/handling, submission of specimen other than nasopharyngeal swab, presence of viral mutation(s) within the areas targeted by this assay, and inadequate number of viral copies(<138 copies/mL). A negative result must be combined with clinical observations, patient history, and epidemiological information. The expected result is Negative.  Fact Sheet for Patients:  EntrepreneurPulse.com.au  Fact Sheet for Healthcare Providers:  IncredibleEmployment.be  This test is no t yet approved or cleared by the [place] FDA and  has been authorized for detection and/or diagnosis of SARS-CoV-2 by FDA under an Emergency Use Authorization (EUA). This EUA will remain  in effect (meaning this test can be used) for the duration of the COVID-19 declaration under Section 564(b)(1) of the Act, 21 U.S.C.section 360bbb-3(b)(1), unless the authorization is terminated  or revoked sooner.       Influenza A by PCR NEGATIVE NEGATIVE Final   Influenza B by PCR NEGATIVE NEGATIVE Final    Comment: (NOTE) The Xpert Xpress SARS-CoV-2/FLU/RSV plus assay is intended as an aid in the diagnosis of influenza from Nasopharyngeal swab specimens and should not be used as a sole basis for treatment. Nasal washings and aspirates are unacceptable for Xpert Xpress SARS-CoV-2/FLU/RSV testing.  Fact Sheet for Patients: EntrepreneurPulse.com.au  Fact Sheet for Healthcare Providers: IncredibleEmployment.be  This test is not yet approved or cleared by the  [place] FDA and has been authorized for detection and/or diagnosis of SARS-CoV-2 by FDA under an Emergency Use Authorization (EUA). This EUA will remain in effect (meaning this test can be used) for the duration of the COVID-19 declaration under Section 564(b)(1) of the Act, 21 U.S.C. section 360bbb-3(b)(1), unless the authorization is terminated or revoked.  Performed at [facility], [place] [address], [place], [place] [address]      Time coordinating discharge: Over 30 minutes  SIGNED:   Little Ishikawa, DO [facility] 02/18/2022, 11:25 AM Pager   If 7PM-7AM, please contact night-coverage [email or web address]

## 2022-02-18 NOTE — Discharge Instructions (Signed)

## 2022-02-18 NOTE — TOC Transition Note (Signed)
Transition of Care [facility]) - CM/SW Discharge Note   Patient Details  Name: SHALAMAR PLOURDE MRN: [id number] Date of Birth: Mar 08, 1941  Transition of Care [facility]) CM/SW Contact:  Tom-Johnson, Hershal Coria, RN Phone Number: 02/18/2022, 12:24 PM   Clinical Narrative:     Patient is scheduled for discharge today. From home with husband an has two supportive adult children. Admitted for Acute Renal Failure. Has a cane, walker and shower seat at home. PCP is Guerry Bruin, MD and uses [facility] on [address]. Home health PT recommended and CM gave patient list of agencies from Medicare.gov and patient chose [facility]. Referral called in to [facility] and acceptance voiced. Information on AVS. Denies any other needs. Husband to transport at discharge. No further TOC needs noted.   Final next level of care: Home w Home Health Services Barriers to Discharge: Barriers Resolved   Patient Goals and CMS Choice Patient states their goals for this hospitalization and ongoing recovery are:: To return home CMS Medicare.gov Compare Post Acute Care list provided to:: Patient Choice offered to / list presented to : Patient  Discharge Placement                Patient to be transferred to facility by: Husband      Discharge Plan and Services                DME Arranged: N/A DME Agency: NA       HH Arranged: PT HH Agency: [facility] Date HH Agency Contacted: 02/18/22 Time HH Agency Contacted: 1210 Representative spoke with at [facility] Agency: Candise Bowens  Social Determinants of Health (SDOH) Interventions     Readmission Risk Interventions No flowsheet data found.

## 2022-02-19 LAB — CBC
HCT: 32.3 % — ABNORMAL LOW (ref 36.0–46.0)
Hemoglobin: 10.6 g/dL — ABNORMAL LOW (ref 12.0–15.0)
MCH: 29.4 pg (ref 26.0–34.0)
MCHC: 32.8 g/dL (ref 30.0–36.0)
MCV: 89.7 fL (ref 80.0–100.0)
Platelets: 157 10*3/uL (ref 150–400)
RBC: 3.6 MIL/uL — ABNORMAL LOW (ref 3.87–5.11)
RDW: 13.2 % (ref 11.5–15.5)
WBC: 1.4 10*3/uL — CL (ref 4.0–10.5)
nRBC: 0 % (ref 0.0–0.2)

## 2022-02-20 ENCOUNTER — Emergency Department (HOSPITAL_COMMUNITY): Payer: Medicare PPO

## 2022-02-20 ENCOUNTER — Other Ambulatory Visit: Payer: Self-pay

## 2022-02-20 ENCOUNTER — Inpatient Hospital Stay (HOSPITAL_COMMUNITY)
Admission: EM | Admit: 2022-02-20 | Discharge: 2022-02-23 | DRG: 690 | Disposition: A | Discharge status: Home Health | Payer: Medicare PPO | Attending: Internal Medicine | Admitting: Internal Medicine

## 2022-02-20 ENCOUNTER — Encounter (HOSPITAL_COMMUNITY): Payer: Self-pay | Admitting: Internal Medicine

## 2022-02-20 DIAGNOSIS — N3000 Acute cystitis without hematuria: Secondary | ICD-10-CM | POA: Diagnosis not present

## 2022-02-20 DIAGNOSIS — N1832 Chronic kidney disease, stage 3b: Secondary | ICD-10-CM

## 2022-02-20 DIAGNOSIS — R55 Syncope and collapse: Secondary | ICD-10-CM | POA: Diagnosis present

## 2022-02-20 DIAGNOSIS — Z8249 Family history of ischemic heart disease and other diseases of the circulatory system: Secondary | ICD-10-CM

## 2022-02-20 DIAGNOSIS — Z823 Family history of stroke: Secondary | ICD-10-CM

## 2022-02-20 DIAGNOSIS — N12 Tubulo-interstitial nephritis, not specified as acute or chronic: Secondary | ICD-10-CM

## 2022-02-20 DIAGNOSIS — N390 Urinary tract infection, site not specified: Principal | ICD-10-CM | POA: Diagnosis present

## 2022-02-20 DIAGNOSIS — R0902 Hypoxemia: Secondary | ICD-10-CM | POA: Diagnosis present

## 2022-02-20 DIAGNOSIS — Z950 Presence of cardiac pacemaker: Secondary | ICD-10-CM

## 2022-02-20 DIAGNOSIS — Z20822 Contact with and (suspected) exposure to covid-19: Secondary | ICD-10-CM | POA: Diagnosis present

## 2022-02-20 DIAGNOSIS — E039 Hypothyroidism, unspecified: Secondary | ICD-10-CM | POA: Diagnosis present

## 2022-02-20 DIAGNOSIS — I442 Atrioventricular block, complete: Secondary | ICD-10-CM | POA: Diagnosis present

## 2022-02-20 DIAGNOSIS — Z7901 Long term (current) use of anticoagulants: Secondary | ICD-10-CM

## 2022-02-20 DIAGNOSIS — D631 Anemia in chronic kidney disease: Secondary | ICD-10-CM | POA: Diagnosis present

## 2022-02-20 DIAGNOSIS — Z79899 Other long term (current) drug therapy: Secondary | ICD-10-CM

## 2022-02-20 DIAGNOSIS — I10 Essential (primary) hypertension: Secondary | ICD-10-CM | POA: Diagnosis present

## 2022-02-20 DIAGNOSIS — Z888 Allergy status to other drugs, medicaments and biological substances status: Secondary | ICD-10-CM

## 2022-02-20 DIAGNOSIS — I130 Hypertensive heart and chronic kidney disease with heart failure and stage 1 through stage 4 chronic kidney disease, or unspecified chronic kidney disease: Secondary | ICD-10-CM | POA: Diagnosis present

## 2022-02-20 DIAGNOSIS — I480 Paroxysmal atrial fibrillation: Secondary | ICD-10-CM | POA: Diagnosis present

## 2022-02-20 DIAGNOSIS — N179 Acute kidney failure, unspecified: Secondary | ICD-10-CM | POA: Diagnosis present

## 2022-02-20 DIAGNOSIS — I951 Orthostatic hypotension: Secondary | ICD-10-CM | POA: Diagnosis present

## 2022-02-20 DIAGNOSIS — Z79890 Hormone replacement therapy (postmenopausal): Secondary | ICD-10-CM

## 2022-02-20 DIAGNOSIS — M1990 Unspecified osteoarthritis, unspecified site: Secondary | ICD-10-CM | POA: Diagnosis present

## 2022-02-20 DIAGNOSIS — E1122 Type 2 diabetes mellitus with diabetic chronic kidney disease: Secondary | ICD-10-CM | POA: Diagnosis present

## 2022-02-20 DIAGNOSIS — Z96653 Presence of artificial knee joint, bilateral: Secondary | ICD-10-CM | POA: Diagnosis present

## 2022-02-20 DIAGNOSIS — E785 Hyperlipidemia, unspecified: Secondary | ICD-10-CM | POA: Diagnosis present

## 2022-02-20 DIAGNOSIS — I2510 Atherosclerotic heart disease of native coronary artery without angina pectoris: Secondary | ICD-10-CM | POA: Diagnosis present

## 2022-02-20 DIAGNOSIS — I5032 Chronic diastolic (congestive) heart failure: Secondary | ICD-10-CM | POA: Diagnosis present

## 2022-02-20 LAB — URINALYSIS, ROUTINE W REFLEX MICROSCOPIC
Bilirubin Urine: NEGATIVE
Glucose, UA: NEGATIVE mg/dL
Ketones, ur: NEGATIVE mg/dL
Nitrite: NEGATIVE
Protein, ur: 100 mg/dL — AB
Specific Gravity, Urine: 1.011 (ref 1.005–1.030)
WBC, UA: >50 WBC/hpf — ABNORMAL HIGH (ref 0–5)
pH: 5 (ref 5.0–8.0)

## 2022-02-20 LAB — PROTIME-INR
INR: 1.9 — ABNORMAL HIGH (ref 0.8–1.2)
Prothrombin Time: 21.6 seconds — ABNORMAL HIGH (ref 11.4–15.2)

## 2022-02-20 LAB — CBC WITH DIFFERENTIAL/PLATELET
Abs Immature Granulocytes: 0.02 10*3/uL (ref 0.00–0.07)
Basophils Absolute: 0 10*3/uL (ref 0.0–0.1)
Basophils Relative: 1 %
Eosinophils Absolute: 0 10*3/uL (ref 0.0–0.5)
Eosinophils Relative: 1 %
HCT: 30.7 % — ABNORMAL LOW (ref 36.0–46.0)
Hemoglobin: 10 g/dL — ABNORMAL LOW (ref 12.0–15.0)
Immature Granulocytes: 1 %
Lymphocytes Relative: 15 %
Lymphs Abs: 0.5 10*3/uL — ABNORMAL LOW (ref 0.7–4.0)
MCH: 29.2 pg (ref 26.0–34.0)
MCHC: 32.6 g/dL (ref 30.0–36.0)
MCV: 89.8 fL (ref 80.0–100.0)
Monocytes Absolute: 0.1 10*3/uL (ref 0.1–1.0)
Monocytes Relative: 4 %
Neutro Abs: 2.5 10*3/uL (ref 1.7–7.7)
Neutrophils Relative %: 78 %
Platelets: 186 10*3/uL (ref 150–400)
RBC: 3.42 MIL/uL — ABNORMAL LOW (ref 3.87–5.11)
RDW: 13.5 % (ref 11.5–15.5)
WBC: 3.2 10*3/uL — ABNORMAL LOW (ref 4.0–10.5)
nRBC: 0 % (ref 0.0–0.2)

## 2022-02-20 LAB — COMPREHENSIVE METABOLIC PANEL
ALT: 80 U/L — ABNORMAL HIGH (ref 0–44)
AST: 97 U/L — ABNORMAL HIGH (ref 15–41)
Albumin: 2.7 g/dL — ABNORMAL LOW (ref 3.5–5.0)
Alkaline Phosphatase: 46 U/L (ref 38–126)
Anion gap: 10 (ref 5–15)
BUN: 27 mg/dL — ABNORMAL HIGH (ref 8–23)
CO2: 23 mmol/L (ref 22–32)
Calcium: 9.2 mg/dL (ref 8.9–10.3)
Chloride: 97 mmol/L — ABNORMAL LOW (ref 98–111)
Creatinine, Ser: 2.13 mg/dL — ABNORMAL HIGH (ref 0.44–1.00)
GFR, Estimated: 23 mL/min — ABNORMAL LOW (ref >60)
Glucose, Bld: 118 mg/dL — ABNORMAL HIGH (ref 70–99)
Potassium: 4 mmol/L (ref 3.5–5.1)
Sodium: 130 mmol/L — ABNORMAL LOW (ref 135–145)
Total Bilirubin: 0.4 mg/dL (ref 0.3–1.2)
Total Protein: 7.1 g/dL (ref 6.5–8.1)

## 2022-02-20 LAB — RESP PANEL BY RT-PCR (FLU A&B, COVID) ARPGX2
Influenza A by PCR: NEGATIVE
Influenza B by PCR: NEGATIVE
SARS Coronavirus 2 by RT PCR: NEGATIVE

## 2022-02-20 LAB — LACTIC ACID, PLASMA: Lactic Acid, Venous: 1.2 mmol/L (ref 0.5–1.9)

## 2022-02-20 MED ORDER — APIXABAN 2.5 MG PO TABS
2.5000 mg | ORAL_TABLET | Freq: Two times a day (BID) | ORAL | Status: DC
  Administered 2022-02-21 – 2022-02-23 (×5): 2.5 mg via ORAL
  Filled 2022-02-20 (×6): qty 1

## 2022-02-20 MED ORDER — FERROUS SULFATE 325 (65 FE) MG PO TABS
325.0000 mg | ORAL_TABLET | Freq: Every day | ORAL | Status: DC
  Administered 2022-02-21 – 2022-02-23 (×3): 325 mg via ORAL
  Filled 2022-02-20 (×3): qty 1

## 2022-02-20 MED ORDER — CARVEDILOL PHOSPHATE ER 20 MG PO CP24
20.0000 mg | ORAL_CAPSULE | Freq: Every day | ORAL | Status: DC
  Administered 2022-02-21: 20 mg via ORAL
  Filled 2022-02-20: qty 1

## 2022-02-20 MED ORDER — SODIUM CHLORIDE 0.9 % IV SOLN
1.0000 g | INTRAVENOUS | Status: DC
  Administered 2022-02-21 – 2022-02-23 (×3): 1 g via INTRAVENOUS
  Filled 2022-02-20 (×3): qty 10

## 2022-02-20 MED ORDER — ACETAMINOPHEN 325 MG PO TABS
650.0000 mg | ORAL_TABLET | Freq: Four times a day (QID) | ORAL | Status: DC | PRN
  Administered 2022-02-21 – 2022-02-22 (×2): 650 mg via ORAL
  Filled 2022-02-20 (×2): qty 2

## 2022-02-20 MED ORDER — SODIUM CHLORIDE 0.9 % IV BOLUS
1000.0000 mL | Freq: Once | INTRAVENOUS | Status: AC
  Administered 2022-02-20: 1000 mL via INTRAVENOUS

## 2022-02-20 MED ORDER — SODIUM CHLORIDE 0.9 % IV SOLN
2.0000 g | Freq: Once | INTRAVENOUS | Status: AC
  Administered 2022-02-20: 2 g via INTRAVENOUS
  Filled 2022-02-20: qty 20

## 2022-02-20 MED ORDER — ACETAMINOPHEN 500 MG PO TABS
1000.0000 mg | ORAL_TABLET | Freq: Once | ORAL | Status: AC
  Administered 2022-02-20: 1000 mg via ORAL
  Filled 2022-02-20: qty 2

## 2022-02-20 MED ORDER — ACETAMINOPHEN 650 MG RE SUPP: 650.0000 mg | Freq: Four times a day (QID) | RECTAL | Status: DC | PRN

## 2022-02-20 MED ORDER — LEVOTHYROXINE SODIUM 75 MCG PO TABS
150.0000 ug | ORAL_TABLET | Freq: Every day | ORAL | Status: DC
  Administered 2022-02-21 – 2022-02-23 (×3): 150 ug via ORAL
  Filled 2022-02-20 (×3): qty 2

## 2022-02-20 MED ORDER — ROSUVASTATIN CALCIUM 20 MG PO TABS
40.0000 mg | ORAL_TABLET | Freq: Every day | ORAL | Status: DC
Start: 2022-02-21 — End: 2022-02-23
  Administered 2022-02-21 – 2022-02-23 (×3): 40 mg via ORAL
  Filled 2022-02-20 (×3): qty 2

## 2022-02-20 MED ORDER — MIRTAZAPINE 15 MG PO TABS
7.5000 mg | ORAL_TABLET | Freq: Every day | ORAL | Status: DC
Start: 2022-02-20 — End: 2022-02-23
  Administered 2022-02-20 – 2022-02-22 (×3): 7.5 mg via ORAL
  Filled 2022-02-20 (×4): qty 1

## 2022-02-20 NOTE — ED Provider Triage Note (Signed)
Emergency Medicine Provider Triage Evaluation Note ? ?Lauren Henry , a 81 y.o. female  was evaluated in triage.  Pt complains of continued weakness and dizziness with new pre-syncopal event.  Increased urinary frequency and unable to control urination.  Still feels the urg to urinate before it happens.  Denies changes in bowel habits.  Decreased appetite.  Febrile on evaluation, and EMS provided 350 cc fluid during transport. ? ?Review of Systems  ?Positive: Weakness, urinary frequency ?Negative: N/V, chest pain ? ?Physical Exam  ?BP 121/69 (BP Location: Left Arm)   Pulse 80   Temp (!) 101 ?F (38.3 ?C) (Oral)   Resp 18   SpO2 94%  ?Gen:   Awake, ill-appearing ?Resp:  Normal effort, crackles in R base ?MSK:   Moves extremities with some difficulty  ?Other:  Oriented but not very engaged.  Pulses 2+ bilaterally.  Skin warm and clammy.  Strong odor of urine.  No abdominal tenderness elicited ? ?Medical Decision Making  ?Medically screening exam initiated at 3:59 PM.  Appropriate orders placed.  TASHIA LEITERMAN was informed that the remainder of the evaluation will be completed by another provider, this initial triage assessment does not replace that evaluation, and the importance of remaining in the ED until their evaluation is complete. ? ?Imaging and labs ordered.  Tylenol given. ?  ?Cecil Cobbs, PA-C ?02/20/22 1611 ? ?

## 2022-02-20 NOTE — ED Provider Notes (Signed)
[facility] EMERGENCY DEPARTMENT Provider Note   CSN: [id number] Arrival date & time: 02/20/22  1518     History  No chief complaint on file.   Lauren Henry is a 81 y.o. female.  81 yo F with a cc of fatigue.  Was just admitted to the hospital with the same.  Had an event today where she tried to get up to go to the bathroom and collapsed on the bed. Lost consciousness for a couple seconds per family.  Denies injury. Family noted that she has been quite tachypnic at home.  When they counted they got 26x a min.  Hypoxic on home device.  Having some increased urinary frequency.  Patient denies rash, denies chest pain, sob, fevers, vomiting, diarrhea.  Decreased oral intake today.        Home Medications Prior to Admission medications   Medication Sig Start Date End Date Taking? Authorizing Provider  apixaban (ELIQUIS) 2.5 MG TABS tablet Take 1 tablet (2.5 mg total) by mouth 2 (two) times daily. 02/18/22   Azucena Fallen, MD  carvedilol (COREG CR) 20 MG 24 hr capsule TAKE 1 CAPSULE(20 MG) BY MOUTH DAILY 10/17/21   Chilton Si, MD  ferrous sulfate 325 (65 FE) MG tablet Take 325 mg by mouth daily with breakfast.    [provider]  levothyroxine (SYNTHROID, LEVOTHROID) 150 MCG tablet Take 150 mcg by mouth daily before breakfast.  10/15/17   [provider]  mirtazapine (REMERON) 7.5 MG tablet Take 7.5 mg by mouth at bedtime.  11/03/18   [provider]  Multiple Vitamin (MULTIVITAMIN WITH MINERALS) TABS tablet Take 1 tablet by mouth daily.    [provider]  pantoprazole (PROTONIX) 40 MG tablet Take 40 mg by mouth daily. 11/10/18   [provider]  rosuvastatin (CRESTOR) 40 MG tablet TAKE 1 TABLET BY MOUTH DAILY 02/10/22   Chilton Si, MD  vitamin C (ASCORBIC ACID) 250 MG tablet Take 250 mg by mouth daily.    [provider]  VITAMIN D, CHOLECALCIFEROL, PO Take 1 tablet by mouth daily.    [provider]      Allergies    Olmesartan and Amlodipine    Review of Systems   Review of Systems  Physical Exam Updated Vital Signs BP (!) 109/56   Pulse 60   Temp 98.9 F (37.2 C)   Resp 17   SpO2 98%  Physical Exam Vitals and nursing note reviewed.  Constitutional:      General: She is not in acute distress.    Appearance: She is well-developed. She is not diaphoretic.  HENT:     Head: Normocephalic and atraumatic.  Eyes:     Pupils: Pupils are equal, round, and reactive to light.  Cardiovascular:     Rate and Rhythm: Normal rate and regular rhythm.     Heart sounds: No murmur heard.   No friction rub. No gallop.  Pulmonary:     Effort: Pulmonary effort is normal.     Breath sounds: No wheezing or rales.  Abdominal:     General: There is no distension.     Palpations: Abdomen is soft.     Tenderness: There is no abdominal tenderness.  Musculoskeletal:        General: No tenderness.     Cervical back: Normal range of motion and neck supple.  Skin:    General: Skin is warm and dry.  Neurological:     Mental Status:  She is alert and oriented to person, place, and time.  Psychiatric:        Behavior: Behavior normal.    ED Results / Procedures / Treatments   Labs (all labs ordered are listed, but only abnormal results are displayed) Labs Reviewed  COMPREHENSIVE METABOLIC PANEL - Abnormal; Notable for the following components:      Result Value   Sodium 130 (*)    Chloride 97 (*)    Glucose, Bld 118 (*)    BUN 27 (*)    Creatinine, Ser 2.13 (*)    Albumin 2.7 (*)    AST 97 (*)    ALT 80 (*)    GFR, Estimated 23 (*)    All other components within normal limits  CBC WITH DIFFERENTIAL/PLATELET - Abnormal; Notable for the following components:   WBC 3.2 (*)    RBC 3.42 (*)    Hemoglobin 10.0 (*)    HCT 30.7 (*)    Lymphs Abs 0.5 (*)    All other components within normal limits  PROTIME-INR - Abnormal; Notable for the following components:    Prothrombin Time 21.6 (*)    INR 1.9 (*)    All other components within normal limits  URINALYSIS, ROUTINE W REFLEX MICROSCOPIC - Abnormal; Notable for the following components:   Color, Urine AMBER (*)    APPearance CLOUDY (*)    Hgb urine dipstick MODERATE (*)    Protein, ur 100 (*)    Leukocytes,Ua LARGE (*)    WBC, UA >50 (*)    Bacteria, UA MANY (*)    All other components within normal limits  RESP PANEL BY RT-PCR (FLU A&B, COVID) ARPGX2  CULTURE, BLOOD (ROUTINE X 2)  CULTURE, BLOOD (ROUTINE X 2)  LACTIC ACID, PLASMA  BASIC METABOLIC PANEL  CBC    EKG None  Radiology DG Chest 2 View  Result Date: 02/20/2022 CLINICAL DATA:  Suspected sepsis.  Weakness. EXAM: CHEST - 2 VIEW COMPARISON:  02/17/2022 FINDINGS: Reverse lordotic positioning. Allowing for this, no change is appreciated. Heart size is normal. 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 Question bronchial thickening pattern without consolidation, collapse or effusion. Bony structures are unremarkable. IMPRESSION: No appreciable change. Question bronchitis pattern. No consolidation or collapse. Electronically Signed   By: Paulina Fusi M.D.   On: 02/20/2022 16:30    Procedures Procedures    Medications Ordered in ED Medications  cefTRIAXone (ROCEPHIN) 2 g in sodium chloride 0.9 % 100 mL IVPB (has no administration in time range)  carvedilol (COREG CR) 24 hr capsule 20 mg (has no administration in time range)  rosuvastatin (CRESTOR) tablet 40 mg (has no administration in time range)  mirtazapine (REMERON) tablet 7.5 mg (has no administration in time range)  levothyroxine (SYNTHROID) tablet 150 mcg (has no administration in time range)  apixaban (ELIQUIS) tablet 2.5 mg (has no administration in time range)  ferrous sulfate tablet 325 mg (has no administration in time range)  acetaminophen (TYLENOL) tablet 650 mg (has no administration in time range)    Or  acetaminophen (TYLENOL) suppository 650 mg (has no  administration in time range)  cefTRIAXone (ROCEPHIN) 1 g in sodium chloride 0.9 % 100 mL IVPB (has no administration in time range)  acetaminophen (TYLENOL) tablet 1,000 mg (1,000 mg Oral Given 02/20/22 1555)  sodium chloride 0.9 % bolus 1,000 mL (1,000 mLs Intravenous New Bag/Given 02/20/22 1804)  sodium chloride 0.9 % bolus 1,000 mL (1,000 mLs Intravenous New Bag/Given 02/20/22 2011)  ED Course/ Medical Decision Making/ A&P                           Medical Decision Making Amount and/or Complexity of Data Reviewed Labs: ordered. Radiology: ordered.  Risk OTC drugs. Decision regarding hospitalization.   81 yo F recently admitted for syncope, thought to be secondary to hypovolemia/orthostasis.  Back with similar symptoms.  Febrile on arrival here.  Lactate normal.  No significant anemia no significant electrolyte abnormality.  UA is concerning for a urinary tract infection with too numerous to count bacteria and greater than 50 white cells.  Combined with her having urinary symptoms will start on antibiotics.   The patients results and plan were reviewed and discussed.   Any x-rays performed were independently reviewed by myself.   Differential diagnosis were considered with the presenting HPI.  Medications  cefTRIAXone (ROCEPHIN) 2 g in sodium chloride 0.9 % 100 mL IVPB (has no administration in time range)  carvedilol (COREG CR) 24 hr capsule 20 mg (has no administration in time range)  rosuvastatin (CRESTOR) tablet 40 mg (has no administration in time range)  mirtazapine (REMERON) tablet 7.5 mg (has no administration in time range)  levothyroxine (SYNTHROID) tablet 150 mcg (has no administration in time range)  apixaban (ELIQUIS) tablet 2.5 mg (has no administration in time range)  ferrous sulfate tablet 325 mg (has no administration in time range)  acetaminophen (TYLENOL) tablet 650 mg (has no administration in time range)    Or  acetaminophen (TYLENOL) suppository 650 mg  (has no administration in time range)  cefTRIAXone (ROCEPHIN) 1 g in sodium chloride 0.9 % 100 mL IVPB (has no administration in time range)  acetaminophen (TYLENOL) tablet 1,000 mg (1,000 mg Oral Given 02/20/22 1555)  sodium chloride 0.9 % bolus 1,000 mL (1,000 mLs Intravenous New Bag/Given 02/20/22 1804)  sodium chloride 0.9 % bolus 1,000 mL (1,000 mLs Intravenous New Bag/Given 02/20/22 2011)    Vitals:   02/20/22 1915 02/20/22 1930 02/20/22 1945 02/20/22 2000  BP: 99/62 109/70 118/63 (!) 109/56  Pulse: 60 61 (!) 50 60  Resp: 17 17 18 17   Temp:      TempSrc:      SpO2: 95% 95% 96% 98%    Final diagnoses:  Pyelonephritis    Admission/ observation were discussed with the admitting physician, patient and/or family and they are comfortable with the plan.         Final Clinical Impression(s) / ED Diagnoses Final diagnoses:  Pyelonephritis    Rx / DC Orders ED Discharge Orders     None         Melene Plan, DO 02/20/22 2051

## 2022-02-20 NOTE — ED Notes (Signed)
ED Provider at bedside. 

## 2022-02-20 NOTE — ED Triage Notes (Addendum)
Pt from home via EMS for eval of continued weakness and dizziness since the weekend. Seen for same here Sunday and d/c. Pt also had syncopal event at home today, falling back onto the bed without injury. Per EMS strong smell of urine when they moved her. Pt endorses urinary frequency. Febrile in triage. 350 cc fluid given by EMS.  ?

## 2022-02-20 NOTE — H&P (Addendum)
History and Physical    Lauren Henry [id number] DOB: 11-Sep-1941 DOA: 02/20/2022  PCP: Haywood Pao, MD  Patient coming from: Home.  Chief Complaint: Weakness and dizziness.  Loss of consciousness.  HPI: Lauren Henry is a 81 y.o. female with history of complete heart block status post pacemaker placement, paroxysmal atrial fibrillation, nonobstructive CAD, diastolic dysfunction per 2D echo done in 2019, hypothyroidism, chronic kidney disease stage III was admitted last week discharged about 3 days ago being admitted for dizziness at the time patient's clonidine hydralazine and spironolactone was discontinued patient was hydrated.  Patient felt better at the time of discharge.  At home patient became again dizzy today and also had a brief episode of syncopal episode when patient fell back onto the bed.  Family also noticed strong odor to urine.  ED Course: In the ER patient was febrile with temperatures around 101 F patient's blood pressure was in the low normal.  Patient's dizziness mostly on standing consistent with orthostatic changes and low blood pressure.  Was given fluid bolus.  Urine culture blood cultures and COVID test was negative started on antibiotics for UTI admitted for further observation.  Review of Systems: As per HPI, rest all negative.   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, developed CHB 10/2014 s/p Medtronic PPM; atrial fib (PAF)   History of blood transfusion as a child   no abnormal reaction noted   History of colon polyps    Hyperlipidemia    takes Pravastatin daily   Hypertension    takes Azor and Metoprolol daily   Hypothyroidism    takes Synthroid daily   Joint pain    Presence of permanent cardiac pacemaker    Urinary frequency    Urinary urgency     Past Surgical History:  Procedure Laterality Date   ABDOMINAL HYSTERECTOMY      cataract surgery Bilateral    CHOLECYSTECTOMY  1971   COLONOSCOPY     EYE SURGERY     INSERT / REPLACE / REMOVE PACEMAKER     Medtronic PPM 11/01/14 (Dr. Cristopher Peru)   JOINT REPLACEMENT Right    knee   LEFT HEART CATH AND CORONARY ANGIOGRAPHY N/A 11/01/2018   Procedure: LEFT HEART CATH AND CORONARY ANGIOGRAPHY;  Surgeon: Nelva Bush, MD;  Location: [facility];  Service: Cardiovascular;  Laterality: N/A;   PERMANENT PACEMAKER INSERTION N/A 11/01/2014   Procedure: PERMANENT PACEMAKER INSERTION;  Surgeon: Evans Lance, MD;  Location: [facility];  Service: Cardiovascular;  Laterality: N/A;   stomach stapled  1985   TOTAL KNEE ARTHROPLASTY Left 11/05/2015   Procedure: TOTAL KNEE ARTHROPLASTY;  Surgeon: Vickey Huger, MD;  Location: [facility];  Service: Orthopedics;  Laterality: Left;     reports that she has never smoked. She has never used smokeless tobacco. She reports current alcohol use of about 1.0 standard drink per week. She reports that she does not use drugs.  Allergies  Allergen Reactions   Olmesartan     enteritis   Amlodipine Swelling and Rash    Family History  Problem Relation Age of Onset   Heart disease Father    Heart attack Father    Stroke Sister     Prior to Admission medications   Medication Sig Start Date End Date Taking? Authorizing Provider  apixaban Arne Cleveland)  2.5 MG TABS tablet Take 1 tablet (2.5 mg total) by mouth 2 (two) times daily. 02/18/22   Little Ishikawa, MD  carvedilol (COREG CR) 20 MG 24 hr capsule TAKE 1 CAPSULE(20 MG) BY MOUTH DAILY 10/17/21   Skeet Latch, MD  ferrous sulfate 325 (65 FE) MG tablet Take 325 mg by mouth daily with breakfast.    [provider]  levothyroxine (SYNTHROID, LEVOTHROID) 150 MCG tablet Take 150 mcg by mouth daily before breakfast.  10/15/17   [provider]  mirtazapine (REMERON) 7.5 MG tablet Take 7.5 mg by mouth at bedtime.  11/03/18   [provider]  Multiple  Vitamin (MULTIVITAMIN WITH MINERALS) TABS tablet Take 1 tablet by mouth daily.    [provider]  pantoprazole (PROTONIX) 40 MG tablet Take 40 mg by mouth daily. 11/10/18   [provider]  rosuvastatin (CRESTOR) 40 MG tablet TAKE 1 TABLET BY MOUTH DAILY 02/10/22   Skeet Latch, MD  vitamin C (ASCORBIC ACID) 250 MG tablet Take 250 mg by mouth daily.    [provider]  VITAMIN D, CHOLECALCIFEROL, PO Take 1 tablet by mouth daily.    [provider]    Physical Exam: Constitutional: Moderately built and nourished. Vitals:   02/20/22 1915 02/20/22 1930 02/20/22 1945 02/20/22 2000  BP: 99/62 109/70 118/63 (!) 109/56  Pulse: 60 61 (!) 50 60  Resp: 17 17 18 17   Temp:      TempSrc:      SpO2: 95% 95% 96% 98%   Eyes: Anicteric no pallor. ENMT: No discharge from the ears eyes nose and mouth. Neck: No mass felt.  No neck rigidity. Respiratory: No rhonchi or crepitations. Cardiovascular: S1-S2 heard. Abdomen: Soft nontender bowel sound present. Musculoskeletal: No edema. Skin: No rash. Neurologic: Alert awake oriented to time place and person.  Moves all extremities. Psychiatric: Appears normal.  Normal affect.   Labs on Admission: I have personally reviewed following labs and imaging studies  CBC: Recent Labs  Lab 02/17/22 1315 02/17/22 1433 02/18/22 0422 02/20/22 1607  WBC 1.4* 1.4* 1.5* 3.2*  NEUTROABS  --  0.9*  --  2.5  HGB 10.6* 10.7* 9.5* 10.0*  HCT 32.3* 32.0* 28.2* 30.7*  MCV 89.7 89.1 87.6 89.8  PLT 157 162 148* [id number]   Basic Metabolic Panel: Recent Labs  Lab 02/17/22 1315 02/18/22 0422 02/20/22 1607  NA 133* 133* 130*  K 3.9 3.8 4.0  CL 100 102 97*  CO2 22 22 23   GLUCOSE 145* 93 118*  BUN 29* 25* 27*  CREATININE 2.29* 2.11* 2.13*  CALCIUM 9.9 9.6 9.2   GFR: Estimated Creatinine Clearance: 21.4 mL/min (A) (by C-G formula based on SCr of 2.13 mg/dL (H)). Liver Function Tests: Recent Labs  Lab 02/17/22 1520  02/18/22 0422 02/20/22 1607  AST 52* 43* 97*  ALT 58* 48* 80*  ALKPHOS 49 39 46  BILITOT 0.6 0.8 0.4  PROT 7.6 7.0 7.1  ALBUMIN 3.0* 2.6* 2.7*   No results for input(s): LIPASE, AMYLASE in the last 168 hours. No results for input(s): AMMONIA in the last 168 hours. Coagulation Profile: Recent Labs  Lab 02/20/22 1607  INR 1.9*   Cardiac Enzymes: No results for input(s): CKTOTAL, CKMB, CKMBINDEX, TROPONINI in the last 168 hours. BNP (last 3 results) No results for input(s): PROBNP in the last 8760 hours. HbA1C: No results for input(s): HGBA1C in the last 72 hours. CBG: No results for input(s): GLUCAP in the last 168 hours. Lipid Profile:  No results for input(s): CHOL, HDL, LDLCALC, TRIG, CHOLHDL, LDLDIRECT in the last 72 hours. Thyroid Function Tests: No results for input(s): TSH, T4TOTAL, FREET4, T3FREE, THYROIDAB in the last 72 hours. Anemia Panel: No results for input(s): VITAMINB12, FOLATE, FERRITIN, TIBC, IRON, RETICCTPCT in the last 72 hours. Urine analysis:    Component Value Date/Time   COLORURINE AMBER (A) 02/20/2022 1954   APPEARANCEUR CLOUDY (A) 02/20/2022 1954   LABSPEC 1.011 02/20/2022 1954   PHURINE 5.0 02/20/2022 1954   GLUCOSEU NEGATIVE 02/20/2022 1954   HGBUR MODERATE (A) 02/20/2022 1954   BILIRUBINUR NEGATIVE 02/20/2022 1954   KETONESUR NEGATIVE 02/20/2022 1954   PROTEINUR 100 (A) 02/20/2022 1954   UROBILINOGEN 0.2 10/26/2015 0957   NITRITE NEGATIVE 02/20/2022 1954   LEUKOCYTESUR LARGE (A) 02/20/2022 1954   Sepsis Labs: @LABRCNTIP (procalcitonin:4,lacticidven:4) ) Recent Results (from the past 240 hour(s))  Resp Panel by RT-PCR (Flu A&B, Covid) Nasopharyngeal Swab     Status: None   Collection Time: 02/17/22 12:47 PM   Specimen: Nasopharyngeal Swab; Nasopharyngeal(NP) swabs in vial transport medium  Result Value Ref Range Status   SARS Coronavirus 2 by RT PCR NEGATIVE NEGATIVE Final    Comment: (NOTE) SARS-CoV-2 target nucleic acids are NOT  DETECTED.  The SARS-CoV-2 RNA is generally detectable in upper respiratory specimens during the acute phase of infection. The lowest concentration of SARS-CoV-2 viral copies this assay can detect is 138 copies/mL. A negative result does not preclude SARS-Cov-2 infection and should not be used as the sole basis for treatment or other patient management decisions. A negative result may occur with  improper specimen collection/handling, submission of specimen other than nasopharyngeal swab, presence of viral mutation(s) within the areas targeted by this assay, and inadequate number of viral copies(<138 copies/mL). A negative result must be combined with clinical observations, patient history, and epidemiological information. The expected result is Negative.  Fact Sheet for Patients:  EntrepreneurPulse.com.au  Fact Sheet for Healthcare Providers:  IncredibleEmployment.be  This test is no t yet approved or cleared by the [place] FDA and  has been authorized for detection and/or diagnosis of SARS-CoV-2 by FDA under an Emergency Use Authorization (EUA). This EUA will remain  in effect (meaning this test can be used) for the duration of the COVID-19 declaration under Section 564(b)(1) of the Act, 21 U.S.C.section 360bbb-3(b)(1), unless the authorization is terminated  or revoked sooner.       Influenza A by PCR NEGATIVE NEGATIVE Final   Influenza B by PCR NEGATIVE NEGATIVE Final    Comment: (NOTE) The Xpert Xpress SARS-CoV-2/FLU/RSV plus assay is intended as an aid in the diagnosis of influenza from Nasopharyngeal swab specimens and should not be used as a sole basis for treatment. Nasal washings and aspirates are unacceptable for Xpert Xpress SARS-CoV-2/FLU/RSV testing.  Fact Sheet for Patients: EntrepreneurPulse.com.au  Fact Sheet for Healthcare Providers: IncredibleEmployment.be  This test is not yet  approved or cleared by the [place] FDA and has been authorized for detection and/or diagnosis of SARS-CoV-2 by FDA under an Emergency Use Authorization (EUA). This EUA will remain in effect (meaning this test can be used) for the duration of the COVID-19 declaration under Section 564(b)(1) of the Act, 21 U.S.C. section 360bbb-3(b)(1), unless the authorization is terminated or revoked.  Performed at [facility], [place] [address], [place], [place] [address]   Respiratory (~20 pathogens) panel by PCR     Status: None   Collection Time: 02/17/22 12:47 PM   Specimen: Nasopharyngeal Swab; Respiratory  Result Value Ref Range Status   Adenovirus NOT DETECTED NOT DETECTED Final   Coronavirus 229E NOT DETECTED NOT DETECTED Final    Comment: (NOTE) The Coronavirus on the Respiratory Panel, DOES NOT test for the novel  Coronavirus (2019 nCoV)    Coronavirus HKU1 NOT DETECTED NOT DETECTED Final   Coronavirus NL63 NOT DETECTED NOT DETECTED Final   Coronavirus OC43 NOT DETECTED NOT DETECTED Final   Metapneumovirus NOT DETECTED NOT DETECTED Final   Rhinovirus / Enterovirus NOT DETECTED NOT DETECTED Final   Influenza A NOT DETECTED NOT DETECTED Final   Influenza B NOT DETECTED NOT DETECTED Final   Parainfluenza Virus 1 NOT DETECTED NOT DETECTED Final   Parainfluenza Virus 2 NOT DETECTED NOT DETECTED Final   Parainfluenza Virus 3 NOT DETECTED NOT DETECTED Final   Parainfluenza Virus 4 NOT DETECTED NOT DETECTED Final   Respiratory Syncytial Virus NOT DETECTED NOT DETECTED Final   Bordetella pertussis NOT DETECTED NOT DETECTED Final   Bordetella Parapertussis NOT DETECTED NOT DETECTED Final   Chlamydophila pneumoniae NOT DETECTED NOT DETECTED Final   Mycoplasma pneumoniae NOT DETECTED NOT DETECTED Final    Comment: Performed at [facility], [place] [address] [place], [place] [address]  Resp Panel by RT-PCR (Flu A&B, Covid) Nasopharyngeal Swab     Status: None   Collection Time:  02/20/22  4:32 PM   Specimen: Nasopharyngeal Swab; Nasopharyngeal(NP) swabs in vial transport medium  Result Value Ref Range Status   SARS Coronavirus 2 by RT PCR NEGATIVE NEGATIVE Final    Comment: (NOTE) SARS-CoV-2 target nucleic acids are NOT DETECTED.  The SARS-CoV-2 RNA is generally detectable in upper respiratory specimens during the acute phase of infection. The lowest concentration of SARS-CoV-2 viral copies this assay can detect is 138 copies/mL. A negative result does not preclude SARS-Cov-2 infection and should not be used as the sole basis for treatment or other patient management decisions. A negative result may occur with  improper specimen collection/handling, submission of specimen other than nasopharyngeal swab, presence of viral mutation(s) within the areas targeted by this assay, and inadequate number of viral copies(<138 copies/mL). A negative result must be combined with clinical observations, patient history, and epidemiological information. The expected result is Negative.  Fact Sheet for Patients:  EntrepreneurPulse.com.au  Fact Sheet for Healthcare Providers:  IncredibleEmployment.be  This test is no t yet approved or cleared by the [place] FDA and  has been authorized for detection and/or diagnosis of SARS-CoV-2 by FDA under an Emergency Use Authorization (EUA). This EUA will remain  in effect (meaning this test can be used) for the duration of the COVID-19 declaration under Section 564(b)(1) of the Act, 21 U.S.C.section 360bbb-3(b)(1), unless the authorization is terminated  or revoked sooner.       Influenza A by PCR NEGATIVE NEGATIVE Final   Influenza B by PCR NEGATIVE NEGATIVE Final    Comment: (NOTE) The Xpert Xpress SARS-CoV-2/FLU/RSV plus assay is intended as an aid in the diagnosis of influenza from Nasopharyngeal swab specimens and should not be used as a sole basis for treatment. Nasal washings  and aspirates are unacceptable for Xpert Xpress SARS-CoV-2/FLU/RSV testing.  Fact Sheet for Patients: EntrepreneurPulse.com.au  Fact Sheet for Healthcare Providers: IncredibleEmployment.be  This test is not yet approved or cleared by the [place] FDA and has been authorized for detection and/or diagnosis of SARS-CoV-2 by FDA under an Emergency Use Authorization (EUA). This EUA will remain in effect (meaning this test can be used) for the  duration of the COVID-19 declaration under Section 564(b)(1) of the Act, 21 U.S.C. section 360bbb-3(b)(1), unless the authorization is terminated or revoked.  Performed at [facility], [place] [address], [place], [place] [address]      Radiological Exams on Admission: DG Chest 2 View  Result Date: 02/20/2022 CLINICAL DATA:  Suspected sepsis.  Weakness. EXAM: CHEST - 2 VIEW COMPARISON:  02/17/2022 FINDINGS: Reverse lordotic positioning. Allowing for this, no change is appreciated. Heart size is normal. 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 Question bronchial thickening pattern without consolidation, collapse or effusion. Bony structures are unremarkable. IMPRESSION: No appreciable change. Question bronchitis pattern. No consolidation or collapse. Electronically Signed   By: Nelson Chimes M.D.   On: 02/20/2022 16:30      Assessment/Plan Principal Problem:   Syncope and collapse Active Problems:   Essential hypertension   Hypothyroidism   CAD in native artery   UTI (urinary tract infection)   Syncope    Syncope likely from orthostatic changes.  Was given fluid bolus we will gently hydrate.  Recheck orthostatics in the morning.  Likely worsened with UTI. Fever likely from UTI.  Cultures have been sent continue with ceftriaxone.  Gentle hydration.  Does not look septic. Diastolic dysfunction per 2D echo done in 2019.  Presently receiving fluids. Hypertension recently was discontinued off clonidine  hydralazine and spironolactone.  We will continue Coreg for now which may need to be held if blood pressure is low. Paroxysmal atrial fibrillation on Eliquis and Coreg. History of complete heart block status post pacemaker placement.  May need pacemaker interrogation. Chronic kidney disease stage III creatinine appears to be at baseline. Hypothyroidism on Synthroid. Chronic anemia likely from renal disease.  Hemoglobin appears to be at baseline. Nonobstructive CAD denies any chest pain.  On beta-blocker and statins.  Takes Eliquis.  EKG is pending.   DVT prophylaxis: Eliquis. Code Status: Full code. Family Communication: We will need to discuss with family. Disposition Plan: Home. Consults called: Physical therapy. Admission status: Observation.   Rise Patience MD [facility] Pager [phone].  If 7PM-7AM, please contact night-coverage [email or web address] Password [facility]  02/20/2022, 8:38 PM

## 2022-02-21 DIAGNOSIS — I480 Paroxysmal atrial fibrillation: Secondary | ICD-10-CM | POA: Diagnosis present

## 2022-02-21 DIAGNOSIS — I10 Essential (primary) hypertension: Secondary | ICD-10-CM | POA: Diagnosis not present

## 2022-02-21 DIAGNOSIS — I951 Orthostatic hypotension: Secondary | ICD-10-CM

## 2022-02-21 DIAGNOSIS — Z950 Presence of cardiac pacemaker: Secondary | ICD-10-CM | POA: Diagnosis not present

## 2022-02-21 DIAGNOSIS — N179 Acute kidney failure, unspecified: Secondary | ICD-10-CM | POA: Diagnosis not present

## 2022-02-21 DIAGNOSIS — E785 Hyperlipidemia, unspecified: Secondary | ICD-10-CM | POA: Diagnosis not present

## 2022-02-21 DIAGNOSIS — M1990 Unspecified osteoarthritis, unspecified site: Secondary | ICD-10-CM | POA: Diagnosis present

## 2022-02-21 DIAGNOSIS — N390 Urinary tract infection, site not specified: Secondary | ICD-10-CM | POA: Diagnosis not present

## 2022-02-21 DIAGNOSIS — I130 Hypertensive heart and chronic kidney disease with heart failure and stage 1 through stage 4 chronic kidney disease, or unspecified chronic kidney disease: Secondary | ICD-10-CM | POA: Diagnosis not present

## 2022-02-21 DIAGNOSIS — N1832 Chronic kidney disease, stage 3b: Secondary | ICD-10-CM

## 2022-02-21 DIAGNOSIS — N3000 Acute cystitis without hematuria: Secondary | ICD-10-CM | POA: Diagnosis not present

## 2022-02-21 DIAGNOSIS — Z79890 Hormone replacement therapy (postmenopausal): Secondary | ICD-10-CM | POA: Diagnosis not present

## 2022-02-21 DIAGNOSIS — E1122 Type 2 diabetes mellitus with diabetic chronic kidney disease: Secondary | ICD-10-CM | POA: Diagnosis not present

## 2022-02-21 DIAGNOSIS — Z96653 Presence of artificial knee joint, bilateral: Secondary | ICD-10-CM | POA: Diagnosis present

## 2022-02-21 DIAGNOSIS — D631 Anemia in chronic kidney disease: Secondary | ICD-10-CM | POA: Diagnosis not present

## 2022-02-21 DIAGNOSIS — Z20822 Contact with and (suspected) exposure to covid-19: Secondary | ICD-10-CM | POA: Diagnosis not present

## 2022-02-21 DIAGNOSIS — I6529 Occlusion and stenosis of unspecified carotid artery: Secondary | ICD-10-CM

## 2022-02-21 DIAGNOSIS — I442 Atrioventricular block, complete: Secondary | ICD-10-CM | POA: Diagnosis present

## 2022-02-21 DIAGNOSIS — E039 Hypothyroidism, unspecified: Secondary | ICD-10-CM | POA: Diagnosis not present

## 2022-02-21 DIAGNOSIS — Z7901 Long term (current) use of anticoagulants: Secondary | ICD-10-CM | POA: Diagnosis not present

## 2022-02-21 DIAGNOSIS — I2510 Atherosclerotic heart disease of native coronary artery without angina pectoris: Secondary | ICD-10-CM | POA: Diagnosis present

## 2022-02-21 DIAGNOSIS — Z888 Allergy status to other drugs, medicaments and biological substances status: Secondary | ICD-10-CM | POA: Diagnosis not present

## 2022-02-21 DIAGNOSIS — R0902 Hypoxemia: Secondary | ICD-10-CM | POA: Diagnosis not present

## 2022-02-21 DIAGNOSIS — Z79899 Other long term (current) drug therapy: Secondary | ICD-10-CM | POA: Diagnosis not present

## 2022-02-21 DIAGNOSIS — Z823 Family history of stroke: Secondary | ICD-10-CM | POA: Diagnosis not present

## 2022-02-21 DIAGNOSIS — R55 Syncope and collapse: Secondary | ICD-10-CM | POA: Diagnosis present

## 2022-02-21 DIAGNOSIS — N183 Chronic kidney disease, stage 3 unspecified: Secondary | ICD-10-CM

## 2022-02-21 DIAGNOSIS — I5032 Chronic diastolic (congestive) heart failure: Secondary | ICD-10-CM | POA: Diagnosis not present

## 2022-02-21 DIAGNOSIS — R030 Elevated blood-pressure reading, without diagnosis of hypertension: Secondary | ICD-10-CM

## 2022-02-21 DIAGNOSIS — Z8249 Family history of ischemic heart disease and other diseases of the circulatory system: Secondary | ICD-10-CM | POA: Diagnosis not present

## 2022-02-21 LAB — BASIC METABOLIC PANEL
Anion gap: 10 (ref 5–15)
BUN: 27 mg/dL — ABNORMAL HIGH (ref 8–23)
CO2: 18 mmol/L — ABNORMAL LOW (ref 22–32)
Calcium: 8.8 mg/dL — ABNORMAL LOW (ref 8.9–10.3)
Chloride: 108 mmol/L (ref 98–111)
Creatinine, Ser: 2.05 mg/dL — ABNORMAL HIGH (ref 0.44–1.00)
GFR, Estimated: 24 mL/min — ABNORMAL LOW (ref >60)
Glucose, Bld: 109 mg/dL — ABNORMAL HIGH (ref 70–99)
Potassium: 3.7 mmol/L (ref 3.5–5.1)
Sodium: 136 mmol/L (ref 135–145)

## 2022-02-21 MED ORDER — METOPROLOL TARTRATE 12.5 MG HALF TABLET
12.5000 mg | ORAL_TABLET | Freq: Two times a day (BID) | ORAL | Status: DC
  Administered 2022-02-21 – 2022-02-23 (×3): 12.5 mg via ORAL
  Filled 2022-02-21 (×4): qty 1

## 2022-02-21 MED ORDER — SODIUM CHLORIDE 0.9 % IV SOLN: INTRAVENOUS | Status: DC

## 2022-02-21 MED ORDER — LIP MEDEX EX OINT
TOPICAL_OINTMENT | CUTANEOUS | Status: DC | PRN
  Administered 2022-02-21: 75 via TOPICAL
  Filled 2022-02-21: qty 7

## 2022-02-21 MED ORDER — CARVEDILOL PHOSPHATE ER 10 MG PO CP24: 10.0000 mg | ORAL_CAPSULE | Freq: Every day | ORAL | Status: DC

## 2022-02-21 NOTE — Evaluation (Signed)
Physical Therapy Evaluation ?Patient Details ?Name: Lauren Henry ?MRN: [id number] ?DOB: 08/27/1941 ?Today's Date: 02/21/2022 ? ?History of Present Illness ? 81 y.o. female readmitted on 3/2 was referred to PT with syncopal episode at home after recent admission for dizziness and weakness.  Pt is noted to have fever, to be dizzy and intolerant of standing, ABT started for UTI.  PMHx:  low back pain, complete heart block status post pacemaker, atrial fibrillation, hypothyroidism, hyperlipidemia, HTN, gastroenteropathy, CKD 3, CAD, anemia  ?Clinical Impression ? Pt was seen for mobility on RW at side of bed, hindered by her light headed feeling upon standing.  Has a bit of trouble getting off bed but this is also hindered by the geography of the gurney.  Pt will be recommended to go home with HHPT as previously, but did not get started before she was readmitted.  Continue on with therapy if pt is remaining here for a time, focusing on strengthening, balance, standing endurance and check her BP for orthostatic changes as she can tolerate.  Encourage OOB to chair when it is logistically practical.    ?   ? ?Recommendations for follow up therapy are one component of a multi-disciplinary discharge planning process, led by the attending physician.  Recommendations may be updated based on patient status, additional functional criteria and insurance authorization. ? ?Follow Up Recommendations Home health PT ? ?  ?Assistance Recommended at Discharge Intermittent Supervision/Assistance  ?Patient can return home with the following ? A little help with walking and/or transfers;A little help with bathing/dressing/bathroom;Assistance with cooking/housework;Assist for transportation;Help with stairs or ramp for entrance ? ?  ?Equipment Recommendations None recommended by PT  ?Recommendations for Other Services ?    ?  ?Functional Status Assessment Patient has had a recent decline in their functional status and demonstrates the ability  to make significant improvements in function in a reasonable and predictable amount of time.  ? ?  ?Precautions / Restrictions Precautions ?Precautions: Fall ?Precaution Comments: orthostatic symptoms ?Restrictions ?Weight Bearing Restrictions: No  ? ?  ? ?Mobility ? Bed Mobility ?Overal bed mobility: Needs Assistance ?Bed Mobility: Supine to Sit, Sit to Supine ?  ?  ?Supine to sit: Mod assist ?Sit to supine: Mod assist ?  ?General bed mobility comments: pt is on awkward gurney but on hosp bed likely has the space to maneuver better ?  ? ?Transfers ?Overall transfer level: Needs assistance ?Equipment used: Rolling walker (2 wheels) ?Transfers: Sit to/from Stand ?Sit to Stand: Min guard ?  ?  ?  ?  ?  ?General transfer comment: min guard but cannot tolerate to stand more than a few seconds ?  ? ?Ambulation/Gait ?  ?  ?  ?  ?Gait velocity: reduced ?  ?  ?General Gait Details: did not tolerate standing to walk ? ?Stairs ?  ?  ?  ?  ?  ? ?Wheelchair Mobility ?  ? ?Modified Rankin (Stroke Patients Only) ?  ? ?  ? ?Balance Overall balance assessment: Mild deficits observed, not formally tested ?  ?  ?  ?  ?  ?  ?  ?  ?  ?  ?  ?  ?  ?  ?  ?  ?  ?  ?   ? ? ? ?Pertinent Vitals/Pain Pain Assessment ?Pain Assessment: No/denies pain  ? ? ?Home Living Family/patient expects to be discharged to:: Private residence ?Living Arrangements: Spouse/significant other ?Available Help at Discharge: Family;Available PRN/intermittently ?Type of Home: House ?Home Access: Ramped  entrance ?Entrance Stairs-Rails: Can reach both ?  ?  ?Home Layout: One level ?Home Equipment: Conservation officer, nature (2 wheels);Cane - single point;Grab bars - toilet;Grab bars - tub/shower;Hand held shower head ?   ?  ?Prior Function Prior Level of Function : Independent/Modified Independent ?  ?  ?  ?  ?  ?  ?Mobility Comments: RW since last admit, but SPC as well ?ADLs Comments: husband has been more helpful since last admit, a bit weaker ?  ? ? ?Hand Dominance  ?  Dominant Hand: Right ? ?  ?Extremity/Trunk Assessment  ?   ?  ? ?Lower Extremity Assessment ?Lower Extremity Assessment: Generalized weakness ?  ? ?Cervical / Trunk Assessment ?Cervical / Trunk Assessment: Kyphotic (mild)  ?Communication  ? Communication: No difficulties  ?Cognition Arousal/Alertness: Awake/alert ?Behavior During Therapy: [facility] for tasks assessed/performed ?Overall Cognitive Status: Impaired/Different from baseline ?Area of Impairment: Safety/judgement ?  ?  ?  ?  ?  ?  ?  ?  ?  ?  ?  ?  ?Safety/Judgement: Decreased awareness of deficits ?  ?  ?General Comments: requires some cues for safety, tends to get up quickly and cannot remain up ?  ?  ? ?  ?General Comments General comments (skin integrity, edema, etc.): pt is up to stand but cannot remain up due to feeling light headed, but too short a time for BP ck as well. ? ?  ?Exercises    ? ?Assessment/Plan  ?  ?PT Assessment Patient needs continued PT services  ?PT Problem List Decreased strength;Decreased activity tolerance;Decreased balance;Decreased mobility;Cardiopulmonary status limiting activity ? ?   ?  ?PT Treatment Interventions DME instruction;Gait training;Functional mobility training;Therapeutic activities;Therapeutic exercise;Balance training;Neuromuscular re-education;Patient/family education   ? ?PT Goals (Current goals can be found in the Care Plan section)  ?Acute Rehab PT Goals ?Patient Stated Goal: home with husband ?PT Goal Formulation: With patient/family ?Time For Goal Achievement: 03/07/22 ?Potential to Achieve Goals: Good ? ?  ?Frequency Min 3X/week ?  ? ? ?Co-evaluation   ?  ?  ?  ?  ? ? ?  ?AM-PAC PT "6 Clicks" Mobility  ?Outcome Measure Help needed turning from your back to your side while in a flat bed without using bedrails?: None ?Help needed moving from lying on your back to sitting on the side of a flat bed without using bedrails?: A Little ?Help needed moving to and from a bed to a chair (including a wheelchair)?: A  Little ?Help needed standing up from a chair using your arms (e.g., wheelchair or bedside chair)?: A Little ?Help needed to walk in hospital room?: A Little ?Help needed climbing 3-5 steps with a railing? : Total ?6 Click Score: 17 ? ?  ?End of Session Equipment Utilized During Treatment: Gait belt;Oxygen ?Activity Tolerance: Patient limited by fatigue;Treatment limited secondary to medical complications (Comment) ?Patient left: in bed;with call bell/phone within reach;with family/visitor present ?Nurse Communication: Mobility status ?PT Visit Diagnosis: Muscle weakness (generalized) (M62.81);Other abnormalities of gait and mobility (R26.89) ?  ? ?Time: [id number] ?PT Time Calculation (min) (ACUTE ONLY): 20 min ? ? ?Charges:   PT Evaluation ?$PT Eval Moderate Complexity: 1 Mod ?  ?  ?   ? ?Ramond Dial ?02/21/2022, 12:18 PM ? ?Mee Hives, PT PhD ?Acute Rehab Dept. Number: [facility] [id number] and [place] [phone] ? ?

## 2022-02-21 NOTE — Assessment & Plan Note (Addendum)
Suspect from orthostatic changes due to UTI, given IV fluids in the ED. recent admission for similar presentation.  On Coreg 20 mg decreased to 10 mg, rest of the blood pressure meds were discontinued on last discharge.  Continue IV fluids IV antibiotics check orthostatic vitals, obtain PT OT evaluation. ?

## 2022-02-21 NOTE — Assessment & Plan Note (Addendum)
AKI on CKD stage IIIb versus progressive CKD: ?Recent baseline creatinine on discharge was 2.13 now at 2.0 his creatinine in 2022 was 1.16-suspect progressive CKD vs aki-with poor oral intake, continue IV fluid hydration repeat BMP in a.m.  ?

## 2022-02-21 NOTE — Assessment & Plan Note (Signed)
PM in place.  We will request cardiology for pacemaker interrogation due to patient's syncope. ?

## 2022-02-21 NOTE — Assessment & Plan Note (Signed)
With fever in ED.  Continue IV antibiotics WBC count stable follow-up culture data ?

## 2022-02-21 NOTE — Consult Note (Addendum)
Cardiology Consultation:   Patient ID: KAORU BERAN MRN: [id number]; DOB: [birth year]  Admit date: 02/20/2022 Date of Consult: 02/21/2022  PCP:  Haywood Pao, MD   [facility] Providers Cardiologist:  Skeet Latch, MD  Electrophysiologist:  Cristopher Peru, MD    Patient Profile:   Lauren Henry is a 81 y.o. female with a history of mild to moderate CAD noted on cardiac cath in 10/2018, paroxysmal atrial fibrillation on Eliquis, complete heart block s/p PPM, bilateral carotid stenosis, labile hypertension, hyperlipidemia who is being seen 02/21/2022 for the evaluation of syncope and orthostatic hypotension at the request of Dr. Lupita Leash.  History of Present Illness:   Lauren Henry is a 81 year old female with the above history who was previously followed by Dr. Oval Linsey but now follows with Dr. Martinique and Dr. Lovena Le.  Lauren Henry has difficult to manage hypertension given labile BP.  Lauren Henry also has a history of paroxysmal atrial fibrillation on Eliquis and complete heart block.  Lauren Henry underwent placement of PPM in 2015 by Dr. Lovena Le.  Echo in 08/2018 showed LVEF of 45-50% with diffuse hypokinesis and severe mitral annular calcification.  Myoview in 09/2018 showed a reversible defect in the inferior, apical, and inferolateral regions.  This was associated with apical akinesis and inferoapical dyskinesis.  This was thought to be either due to ischemia or a pacemaker artifact.  During an admission in 06/2018 for chest pain and hypertensive urgency, Lauren Henry was noted to have mildly elevated troponin.  Lauren Henry underwent cardiac catheterization which showed mild to moderate nonobstructive disease with 50-60% stenosis in the proximal LCx (DFR 1.0).  Echo at that time showed LVEF of D5-60% with normal wall motion, mild LVH, and grade 1 diastolic dysfunction.  Patient was last seen by Dr. Martinique in 11/2021 at which time Lauren Henry was doing very well.  Her BP was well controlled and Lauren Henry was tolerating her medications well.  Lauren Henry was  recently admitted from 02/17/2022 to 02/18/2022 with dizziness and weakness after weeks of poor PO intake.  Lauren Henry was noted to be orthostatic in the ED and was also found to have AKI with creatinine of 2.29 (baseline 1.6).  This was all felt to be due to dehydration secondary to decreased PO intake.  Lauren Henry was treated with IV fluids and symptoms resolved.  Head CT showed no acute findings.  Patient returned to the ED on 02/20/2022 via EMS for further evaluation of continued weakness/dizziness and a syncopal episode.  Patient reports significant weakness for the past month.  Lauren Henry also initially had a dry cough and was seen by PCP and it was thought that Lauren Henry just had some viral illness.  However, family states weakness has been very significant to the point that really has not been getting out of bed much.  Family states Lauren Henry would not be able to walk to the bathroom in her hospital room due to profound weakness.  Lauren Henry also has had some dizziness with standing and decreased PO intake.  Lauren Henry states Lauren Henry just does not have any appetite.  Lauren Henry has been trying to drink enough fluids though.  On the day of presentation, patient states Lauren Henry was getting out of bed to use the restroom and Lauren Henry passed out and fell back onto the bed.  Syncopal episode was preceded by dizziness but no palpitations or chest pain.  Family states Lauren Henry was only unconscious for seconds and basically regained consciousness as soon as Lauren Henry was back on the bed.  No seizure-like activity, bowel/urinary incontinence, or tongue  biting.  Patient denies any shortness of breath but family states that her respiratory rate has been increased the past few days.  Lauren Henry denies any chest pain, orthopnea, PND, lower extremity edema.  Lauren Henry denies any fevers does report increased urinary frequency for a while.  When EMS arrived, they also reportedly stated "it smells like Lauren Henry has a UTI."  Upon arrival to the ED, patient febrile with a temp of 101 but otherwise vital stable.  No EKG  was obtained in the ED. Chest x-ray showed questionable bronchitis pattern. WBC 3.2, Hgb 10.0, Plts 186. Na 130, K 4.0, Glucose 118, BUN 27, Cr 2.13. Lactic acid normal. Respiratory panel negative for COVID and influenza. Urinalysis concerning for UTI. Lauren Henry was started on antibiotics and urine cultures were sent. Orthostatic vitals signs positive with BP dropping from 128/79 supine to 98/71 sitting and 77/60 standing (improved to 91/55 after standing for 3 minutes). Lauren Henry was given IV fluids and admitted for further evaluation.   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, developed CHB 10/2014 s/p Medtronic PPM; atrial fib (PAF)   History of blood transfusion as a child   no abnormal reaction noted   History of colon polyps    Hyperlipidemia    takes Pravastatin daily   Hypertension    takes Azor and Metoprolol daily   Hypothyroidism    takes Synthroid daily   Joint pain    Presence of permanent cardiac pacemaker    Urinary frequency    Urinary urgency     Past Surgical History:  Procedure Laterality Date   ABDOMINAL HYSTERECTOMY     cataract surgery Bilateral    CHOLECYSTECTOMY  1971   COLONOSCOPY     EYE SURGERY     INSERT / REPLACE / REMOVE PACEMAKER     Medtronic PPM 11/01/14 (Dr. Cristopher Peru)   JOINT REPLACEMENT Right    knee   LEFT HEART CATH AND CORONARY ANGIOGRAPHY N/A 11/01/2018   Procedure: LEFT HEART CATH AND CORONARY ANGIOGRAPHY;  Surgeon: Nelva Bush, MD;  Location: [place] [facility];  Service: Cardiovascular;  Laterality: N/A;   PERMANENT PACEMAKER INSERTION N/A 11/01/2014   Procedure: PERMANENT PACEMAKER INSERTION;  Surgeon: Evans Lance, MD;  Location: [facility];  Service: Cardiovascular;  Laterality: N/A;   stomach stapled  1985   TOTAL KNEE ARTHROPLASTY Left 11/05/2015   Procedure: TOTAL KNEE ARTHROPLASTY;  Surgeon: Vickey Huger, MD;  Location:  [place];  Service: Orthopedics;  Laterality: Left;     Home Medications:  Prior to Admission medications   Medication Sig Start Date End Date Taking? Authorizing Provider  apixaban (ELIQUIS) 2.5 MG TABS tablet Take 1 tablet (2.5 mg total) by mouth 2 (two) times daily. 02/18/22  Yes Little Ishikawa, MD  carvedilol (COREG CR) 20 MG 24 hr capsule TAKE 1 CAPSULE(20 MG) BY MOUTH DAILY Patient taking differently: Take 20 mg by mouth daily. 10/17/21  Yes Skeet Latch, MD  dorzolamide (TRUSOPT) 2 % ophthalmic solution Place 1 drop into the left eye 2 (two) times daily. 11/25/21  Yes [provider]  ferrous sulfate 325 (65 FE) MG tablet Take 325 mg by mouth daily with breakfast.   Yes [provider]  ipratropium (ATROVENT) 0.03 % nasal spray Place 2 sprays into both nostrils 2 (two) times daily. 02/05/22  Yes [provider]  levothyroxine (SYNTHROID, LEVOTHROID) 150 MCG tablet Take 150 mcg by mouth daily before breakfast.  10/15/17  Yes [provider]  mirtazapine (REMERON) 7.5 MG tablet Take 7.5 mg by mouth at bedtime.  11/03/18  Yes [provider]  Multiple Vitamin (MULTIVITAMIN WITH MINERALS) TABS tablet Take 1 tablet by mouth daily.   Yes [provider]  ondansetron (ZOFRAN) 4 MG tablet Take 4 mg by mouth every 8 (eight) hours as needed. 02/05/22  Yes [provider]  pantoprazole (PROTONIX) 40 MG tablet Take 40 mg by mouth daily. 11/10/18  Yes [provider]  rosuvastatin (CRESTOR) 40 MG tablet TAKE 1 TABLET BY MOUTH DAILY Patient taking differently: Take 40 mg by mouth daily. 02/10/22  Yes Skeet Latch, MD  vitamin C (ASCORBIC ACID) 250 MG tablet Take 250 mg by mouth daily.   Yes [provider]  VITAMIN D, CHOLECALCIFEROL, PO Take 1 tablet by mouth daily.   Yes [provider]    Inpatient Medications: Scheduled Meds:  apixaban  2.5 mg Oral BID   ferrous sulfate  325 mg Oral Q breakfast    levothyroxine  150 mcg Oral QAC breakfast   mirtazapine  7.5 mg Oral QHS   rosuvastatin  40 mg Oral Daily   Continuous Infusions:  sodium chloride 75 mL/hr at 02/21/22 1515   cefTRIAXone (ROCEPHIN)  IV Stopped (02/21/22 1044)   PRN Meds: acetaminophen **OR** acetaminophen  Allergies:    Allergies  Allergen Reactions   Olmesartan Other (See Comments)    enteritis   Amlodipine Swelling and Rash    Social History:   Social History   Socioeconomic History   Marital status: Married    Spouse name: Not on file   Number of children: Not on file   Years of education: Not on file   Highest education level: Not on file  Occupational History   Not on file  Tobacco Use   Smoking status: Never   Smokeless tobacco: Never  Vaping Use   Vaping Use: Never used  Substance and Sexual Activity   Alcohol use: Yes    Alcohol/week: 1.0 standard drink    Types: 1 Glasses of wine per week   Drug use: No   Sexual activity: Not Currently    Birth control/protection: Surgical  Other Topics Concern   Not on file  Social History Narrative   Not on file   Social Determinants of Health   Financial Resource Strain: Not on file  Food Insecurity: Not on file  Transportation Needs: Not on file  Physical Activity: Not on file  Stress: Not on file  Social Connections: Not on file  Intimate Partner Violence: Not on file    Family History:   Family History  Problem Relation Age of Onset   Heart disease Father    Heart attack Father    Stroke Sister      ROS:  Please see the history of present illness.  Review of Systems  Constitutional:  Negative for fever.  HENT:  Negative for congestion.   Respiratory:  Positive for cough. Negative for sputum production and shortness of breath.        Increased respiratory rate per family  Cardiovascular:  Negative for chest pain, palpitations, orthopnea, leg swelling and PND.  Gastrointestinal:  Positive for nausea. Negative for blood in stool,  melena and vomiting.  Genitourinary:  Negative for hematuria.  Musculoskeletal:  Positive for falls.  Neurological:  Positive for dizziness, loss of consciousness and weakness.  Endo/Heme/Allergies:  Does not bruise/bleed easily.  Psychiatric/Behavioral:  Negative for substance abuse.    Physical Exam/Data:   Vitals:   02/21/22 0945 02/21/22 1030 02/21/22 1334 02/21/22 1433  BP: 118/83 (!) 112/55 132/67 126/70  Pulse: 66  77 71  Resp: (!) 31 (!) 40 20 20  Temp:    98.3 F (36.8 C)  TempSrc:    Oral  SpO2: 94%  95% 92%  Weight:      Height:        Intake/Output Summary (Last 24 hours) at 02/21/2022 1605 Last data filed at 02/21/2022 1044 Gross per 24 hour  Intake 100 ml  Output --  Net 100 ml   Last 3 Weights 02/21/2022 02/18/2022 02/17/2022  Weight (lbs) 171 lb 15.3 oz 174 lb 6.1 oz 202 lb 13.2 oz  Weight (kg) 78 kg 79.1 kg 92 kg     Body mass index is 29.52 kg/m.  General: 81 y.o. Caucasian female resting comfortably in no acute distress. HEENT: Normocephalic and atraumatic. Sclera clear.  Neck: Supple. Left carotid bruit. No JVD. Heart: RRR. Distinct S1 and S2. Sof II/VI systolic murmur noted at upper sternal border. No gallops or rubs. Radial and distal pedal pulses 2+ and equal bilaterally. Lungs: No increased work of breathing. Clear to ausculation bilaterally. No wheezes, rhonchi, or rales.  Abdomen: Soft, non-distended, and non-tender to palpation.  Extremities: No lower extremity edema.    Skin: Warm and dry. Neuro: Alert and oriented x3. No focal deficits. Psych: Normal affect. Responds appropriately.  EKG:  The EKG was personally reviewed and demonstrates:  No EKG today.  Telemetry:  Telemetry was personally reviewed and demonstrates:  V paced rhythm.   Relevant CV Studies:  Echocardiogram 10/31/2018: Study Conclusions: - Left ventricle: The cavity size was normal. Wall thickness was    increased in a pattern of mild LVH. Systolic function was normal.    The  estimated ejection fraction was in the range of 55% to 60%.    Wall motion was normal; there were no regional wall motion    abnormalities. Doppler parameters are consistent with abnormal    left ventricular relaxation (grade 1 diastolic dysfunction).    Doppler parameters are consistent with indeterminate ventricular    filling pressure.  - Aortic valve: Moderately calcified annulus. Trileaflet; mildly    thickened leaflets.  - Mitral valve: Moderately calcified annulus.  - Atrial septum: No defect or patent foramen ovale was identified.  - Tricuspid valve: There was mild regurgitation. _______________  Left Cardiac Catheterization 11/01/2018: Conclusions: Mild to moderate, non-obstructive CAD.  Most severe lesion is a 50-60% stenosis in the proximal LCx that is not hemodynamically significant (DFR 1.0).  I suspect mild troponin elevation reflects supply-demand mismatch in the setting of uncontrolled hypertension and renal insufficiency. Normal left ventricular filling pressure.   Recommendations: Medical therapy and risk factor modification to prevent progression of disease. Consider noninvasive evaluation for renal artery stenosis (if not already performed as an outpatient), given hypertension and renal insufficiency.   Recommend to resume Apixaban, at currently prescribed dose and frequency, on 11/02/18 if no evidence of bleeding or vascular injury.  Concurrent antiplatelet therapy not recommended.   Diagnostic Dominance: Right   _______________  Renal Artery Ultrasound 08/08/2019: Summary: Right: No evidence of visualized right renal artery stenosis limited         visualization due to breathing artifact.  Left:  No evidence of visualized left renal artery stenosis, limited  visualization due to breathing artifact.   _______________  Carotid Ultrasound 11/01/2020: Summary: - Right Carotid: Velocities in the right ICA are consistent with a 1-39% stenosis.  - Left  Carotid: Velocities in the left ICA are consistent with a 40-59%  stenosis.  - Vertebrals:  Bilateral vertebral arteries demonstrate antegrade flow.  - Subclavians: Normal flow hemodynamics were seen in bilateral subclavian arteries.   Laboratory Data:  High Sensitivity Troponin:  No results for input(s): TROPONINIHS in the last 720 hours.   Chemistry Recent Labs  Lab 02/18/22 0422 02/20/22 1607 02/21/22 0340  NA 133* 130* 136  K 3.8 4.0 3.7  CL 102 97* 108  CO2 22 23 18*  GLUCOSE 93 118* 109*  BUN 25* 27* 27*  CREATININE 2.11* 2.13* 2.05*  CALCIUM 9.6 9.2 8.8*  GFRNONAA 23* 23* 24*  ANIONGAP 9 10 10     Recent Labs  Lab 02/17/22 1520 02/18/22 0422 02/20/22 1607  PROT 7.6 7.0 7.1  ALBUMIN 3.0* 2.6* 2.7*  AST 52* 43* 97*  ALT 58* 48* 80*  ALKPHOS 49 39 46  BILITOT 0.6 0.8 0.4   Lipids No results for input(s): CHOL, TRIG, HDL, LABVLDL, LDLCALC, CHOLHDL in the last 168 hours.  Hematology Recent Labs  Lab 02/17/22 1433 02/18/22 0422 02/20/22 1607  WBC 1.4* 1.5* 3.2*  RBC 3.59* 3.22* 3.42*  HGB 10.7* 9.5* 10.0*  HCT 32.0* 28.2* 30.7*  MCV 89.1 87.6 89.8  MCH 29.8 29.5 29.2  MCHC 33.4 33.7 32.6  RDW 13.4 13.3 13.5  PLT 162 148* 186   Thyroid  Recent Labs  Lab 02/17/22 1520  TSH 8.333*    BNPNo results for input(s): BNP, PROBNP in the last 168 hours.  DDimer No results for input(s): DDIMER in the last 168 hours.   Radiology/Studies:  DG Chest 2 View  Result Date: 02/20/2022 CLINICAL DATA:  Suspected sepsis.  Weakness. EXAM: CHEST - 2 VIEW COMPARISON:  02/17/2022 FINDINGS: Reverse lordotic positioning. Allowing for this, no change is appreciated. Heart size is normal. 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 Question bronchial thickening pattern without consolidation, collapse or effusion. Bony structures are unremarkable. IMPRESSION: No appreciable change. Question bronchitis pattern. No consolidation or collapse. Electronically Signed   By: Nelson Chimes M.D.    On: 02/20/2022 16:30     Assessment and Plan:   Syncope  Orthostatic Hypotension Patient recently admitted earlier this week with weakness/dizziness and orthostatic hypotension. Symptoms improved with IV fluids. Returned to the ED on 3/2 with syncope and persistent dizziness/weakness. Orthostatic positive again in the ED with BP dropping from 128/79 supine to 98/71 sitting and 77/60 standing (improved to 91/55 after standing for 3 minutes). Presentation and work-up so far consistent with orthostatic hypotension.  Continue IV fluids.  Will hold home Coreg.  Patient states her device was interrogated in the ED but I am unable to find the report.  I will reach out to device rep for assistance.  Lauren Henry has not had an Echo since 2019 so we will update this as well. Will place compressions stocking. Repeat orthostatics tomorrow.   Non-Obstructive CAD Noted on cath in 2019.  No chest pain.  No aspirin due to need for Eliquis.  Continue statin.  Paroxysmal Atrial Fibrillation V paced on telemetry with rates in the 60s.  We will get a 12 lead EKG  sine one has not been checked yet.  We will hold home Coreg given orthostatic hypotension.  Continue Eliquis 2.5 mg twice daily (lower dose due to  age and renal function).  Complete Heart Block s/p Medtronic PPM Will ask rep for assistance in interrogating device.  Followed by Dr. Lovena Le.  Labile Hypertension History of labile hypertension.  However, patient states BP has been relatively well controlled at home with systolic BP ranges from high 90s to 140s.  Patient previously on Clonidine, Hydralazine, and Spironolactone but these were discontinued at recent discharge due to orthostatic hypotension.  Now only on Coreg CR 20.  We will hold this as well given continued orthostatic hypotension with syncope. Will continue to monitor BP.  Hyperlipidemia Continue Crestor 40mg  daily.  Carotid Stenosis Last carotid ultrasound in 10/2021 mild stenosis (1-39%  stenosis) of bilateral ICAs. Continue Eliquis and statin.  Acute on CKD III Baseline creatinine around 1.6. Creatinine 2.29 during recent admission - this was felt to be due to dehydration from poor PO intake. Lauren Henry was given fluids with only slight improvement in creatinine to 2.11 at discharge. Creatinine stable at 2.13 on this admission. Continue fluids per primary team. Patient also being treated with UTI which may be contributing. Continue to monitor closely.  UTI Patient reports increased urinary frequency for a while now. Lauren Henry was febrile on admission and urinalysis concerning for UTI. Lauren Henry has been started on antibiotics. This may explain her profound weakness lately. Management per primary team.   Risk Assessment/Risk Scores:    CHA2DS2-VASc Score = 5  This indicates a 7.2% annual risk of stroke. The patient's score is based upon: CHF History: 0 HTN History: 1 Diabetes History: 0 Stroke History: 0 Vascular Disease History: 1 Age Score: 2 Gender Score: 1    For questions or updates, please contact [place] Please consult [email or web address] for contact info under    Signed, Darreld Mclean, PA-C  02/21/2022 4:05 PM  ADDENDUM: Spoke with the device rep who was able to locate the device interrogation. Pacemaker functioning normally. Lauren Henry is v-paced >99% of the time. Lauren Henry had no concerning arrhythmias on 02/20/2022 to explain syncopal episode.  Darreld Mclean, PA-C 02/21/2022 4:52 PM

## 2022-02-21 NOTE — ED Notes (Signed)
Patient is has a pur-wick on ?

## 2022-02-21 NOTE — Hospital Course (Addendum)
81 y.o. female with history of complete heart block status post pacemaker placement, paroxysmal atrial fibrillation, nonobstructive CAD, diastolic dysfunction per 2D echo done in 2019, hypothyroidism, chronic kidney disease stage III recently admitted last week discharged 3 days PTA for dizziness AND his clonidine hydralazine and spironolactone were discontinued, ginve ivf,felt better and sent home, presented after becoming dizzy again and also had brief episode of syncope when he fell back onto the bed , also noticed to have a strong odor to urine.   ? ?In the ED febrile 101 BP low normal was dizzy mostly in the standing with orthostatic changes, given IV fluids urine culture sent UA consistent with UTI placed on antibiotics and admitted.   ?PT OT consulted cardiology consulted due to recurrent admission for pacemaker interrogation.  Check orthostatic vitals.  Given IV fluids. Keep on IVF. ? ?

## 2022-02-21 NOTE — Assessment & Plan Note (Signed)
Blood pressure fluctuating.  On Coreg 20 mg twice daily currently recently taken off other antihypertensive.  We will cut down dose of Coreg further. monitor Orthostatic ?

## 2022-02-21 NOTE — Progress Notes (Signed)
PROGRESS NOTE Lauren Henry  [id number] DOB: Jul 19, 1941 DOA: 02/20/2022 PCP: Haywood Pao, MD   Brief Narrative/Hospital Course: 81 y.o. female with history of complete heart block status post pacemaker placement, paroxysmal atrial fibrillation, nonobstructive CAD, diastolic dysfunction per 2D echo done in 2019, hypothyroidism, chronic kidney disease stage III recently admitted last week discharged 3 days PTA for dizziness AND his clonidine hydralazine and spironolactone were discontinued, ginve ivf,felt better and sent home, presented after becoming dizzy again and also had brief episode of syncope when he fell back onto the bed , also noticed to have a strong odor to urine.    In the ED febrile 101 BP low normal was dizzy mostly in the standing with orthostatic changes, given IV fluids urine culture sent UA consistent with UTI placed on antibiotics and admitted.   PT OT consulted cardiology consulted due to recurrent admission for pacemaker interrogation.  Check orthostatic vitals.  Given IV fluids. Keep on IVF.   Subjective: Seen and examined this morning family at the bedside patient has not gotten up no other new complaints no chest pain.  Endorses decreased oral intake.  Assessment and Plan: * Syncope and collapse Suspect from orthostatic changes due to UTI, given IV fluids in the ED. recent admission for similar presentation.  On Coreg 20 mg decreased to 10 mg, rest of the blood pressure meds were discontinued on last discharge.  Continue IV fluids IV antibiotics check orthostatic vitals, obtain PT OT evaluation.  UTI (urinary tract infection) With fever in ED.  Continue IV antibiotics WBC count stable follow-up culture data  Stage 3b chronic kidney disease (CKD) ([place]) AKI on CKD stage IIIb versus progressive CKD: Recent baseline creatinine on discharge was 2.13 now at 2.0 his creatinine in 2022 was 1.16-suspect progressive CKD vs aki-with poor oral intake, continue IV fluid  hydration repeat BMP in a.m.   CAD in native artery Diastolic dysfunction in echo in 2019 Monitor fluid status, no chest pain continue beta-blocker statin and Eliquis.  PAF (paroxysmal atrial fibrillation) (HCC) On Eliquis for anticoagulation on Coreg will decrease the dose.  CHB (complete heart block) ([place]) PM in place.  We will request cardiology for pacemaker interrogation due to patient's syncope.  Hypothyroidism Continue Synthroid  Essential hypertension Blood pressure fluctuating.  On Coreg 20 mg twice daily currently recently taken off other antihypertensive.  We will cut down dose of Coreg further. monitor Orthostatic  DVT prophylaxis: apixaban (ELIQUIS) tablet 2.5 mg Start: 02/20/22 2200 Code Status:   Code Status: Full Code Family Communication: plan of care discussed with patient/family at bedside.  Disposition: Currently not medically stable for discharge. Status is: Observation Remains hospitalized for ongoing IV hydration  and iv antibiotics follow-up on culture data PM integration cardiology evaluation.  Objective: Vitals last 24 hrs: Vitals:   02/21/22 0730 02/21/22 0900 02/21/22 0945 02/21/22 1030  BP: 118/62 118/61 118/83 (!) 112/55  Pulse: 75 71 66   Resp: (!) 27 (!) 24 (!) 31 (!) 40  Temp:      TempSrc:      SpO2: (!) 89% 93% 94%   Weight:      Height:       Weight change:   Physical Examination: General exam: AA0X3,older than stated age, weak appearing. HEENT:Oral mucosa moist, Ear/Nose WNL grossly, dentition normal. Respiratory system: bilaterally diminished BS, no use of accessory muscle Cardiovascular system: S1 & S2 +, No JVD,. Gastrointestinal system: Abdomen soft,NT,ND, BS+ Nervous System:Alert, awake, moving extremities and grossly nonfocal Extremities:  LE edema NONE,distal peripheral pulses palpable.  Skin: No rashes,no icterus. MSK: Normal muscle bulk,tone, power  Medications reviewed:  Scheduled Meds:  apixaban  2.5 mg Oral BID    [START ON 02/22/2022] carvedilol  10 mg Oral Daily   ferrous sulfate  325 mg Oral Q breakfast   levothyroxine  150 mcg Oral QAC breakfast   mirtazapine  7.5 mg Oral QHS   rosuvastatin  40 mg Oral Daily   Continuous Infusions:  sodium chloride     cefTRIAXone (ROCEPHIN)  IV Stopped (02/21/22 1044)      Diet Order             Diet Heart Room service appropriate? Yes; Fluid consistency: Thin  Diet effective now                            Intake/Output Summary (Last 24 hours) at 02/21/2022 1334 Last data filed at 02/21/2022 1044 Gross per 24 hour  Intake 100 ml  Output --  Net 100 ml   Net IO Since Admission: 100 mL [02/21/22 1334]  Wt Readings from Last 3 Encounters:  02/21/22 78 kg  02/18/22 79.1 kg  11/25/21 85.9 kg     Unresulted Labs (From admission, onward)     Start     Ordered   02/21/22 0430  CBC  Once,   R        02/21/22 0430          Data Reviewed: I have personally reviewed following labs and imaging studies CBC: Recent Labs  Lab 02/17/22 1315 02/17/22 1433 02/18/22 0422 02/20/22 1607  WBC 1.4* 1.4* 1.5* 3.2*  NEUTROABS  --  0.9*  --  2.5  HGB 10.6* 10.7* 9.5* 10.0*  HCT 32.3* 32.0* 28.2* 30.7*  MCV 89.7 89.1 87.6 89.8  PLT 157 162 148* 186   Basic Metabolic Panel: Recent Labs  Lab 02/17/22 1315 02/18/22 0422 02/20/22 1607 02/21/22 0340  NA 133* 133* 130* 136  K 3.9 3.8 4.0 3.7  CL 100 102 97* 108  CO2 18*  GLUCOSE 145* 93 118* 109*  BUN 29* 25* 27* 27*  CREATININE 2.29* 2.11* 2.13* 2.05*  CALCIUM 9.9 9.6 9.2 8.8*   GFR: Estimated Creatinine Clearance: 22.1 mL/min (A) (by C-G formula based on SCr of 2.05 mg/dL (H)). Liver Function Tests: Recent Labs  Lab 02/17/22 1520 02/18/22 0422 02/20/22 1607  AST 52* 43* 97*  ALT 58* 48* 80*  ALKPHOS 49 39 46  BILITOT 0.6 0.8 0.4  PROT 7.6 7.0 7.1  ALBUMIN 3.0* 2.6* 2.7*   No results for input(s): LIPASE, AMYLASE in the last 168 hours. No results for input(s): AMMONIA  in the last 168 hours. Coagulation Profile: Recent Labs  Lab 02/20/22 1607  INR 1.9*   Cardiac Enzymes: No results for input(s): CKTOTAL, CKMB, CKMBINDEX, TROPONINI in the last 168 hours. BNP (last 3 results) No results for input(s): PROBNP in the last 8760 hours. HbA1C: No results for input(s): HGBA1C in the last 72 hours. CBG: No results for input(s): GLUCAP in the last 168 hours. Lipid Profile: No results for input(s): CHOL, HDL, LDLCALC, TRIG, CHOLHDL, LDLDIRECT in the last 72 hours. Thyroid Function Tests: No results for input(s): TSH, T4TOTAL, FREET4, T3FREE, THYROIDAB in the last 72 hours. Anemia Panel: No results for input(s): VITAMINB12, FOLATE, FERRITIN, TIBC, IRON, RETICCTPCT in the last 72 hours. Sepsis Labs: Recent Labs  Lab 02/20/22 1607  LATICACIDVEN 1.2  Recent Results (from the past 240 hour(s))  Resp Panel by RT-PCR (Flu A&B, Covid) Nasopharyngeal Swab     Status: None   Collection Time: 02/17/22 12:47 PM   Specimen: Nasopharyngeal Swab; Nasopharyngeal(NP) swabs in vial transport medium  Result Value Ref Range Status   SARS Coronavirus 2 by RT PCR NEGATIVE NEGATIVE Final    Comment: (NOTE) SARS-CoV-2 target nucleic acids are NOT DETECTED.  The SARS-CoV-2 RNA is generally detectable in upper respiratory specimens during the acute phase of infection. The lowest concentration of SARS-CoV-2 viral copies this assay can detect is 138 copies/mL. A negative result does not preclude SARS-Cov-2 infection and should not be used as the sole basis for treatment or other patient management decisions. A negative result may occur with  improper specimen collection/handling, submission of specimen other than nasopharyngeal swab, presence of viral mutation(s) within the areas targeted by this assay, and inadequate number of viral copies(<138 copies/mL). A negative result must be combined with clinical observations, patient history, and epidemiological information.  The expected result is Negative.  Fact Sheet for Patients:  EntrepreneurPulse.com.au  Fact Sheet for Healthcare Providers:  IncredibleEmployment.be  This test is no t yet approved or cleared by the [place] FDA and  has been authorized for detection and/or diagnosis of SARS-CoV-2 by FDA under an Emergency Use Authorization (EUA). This EUA will remain  in effect (meaning this test can be used) for the duration of the COVID-19 declaration under Section 564(b)(1) of the Act, 21 U.S.C.section 360bbb-3(b)(1), unless the authorization is terminated  or revoked sooner.       Influenza A by PCR NEGATIVE NEGATIVE Final   Influenza B by PCR NEGATIVE NEGATIVE Final    Comment: (NOTE) The Xpert Xpress SARS-CoV-2/FLU/RSV plus assay is intended as an aid in the diagnosis of influenza from Nasopharyngeal swab specimens and should not be used as a sole basis for treatment. Nasal washings and aspirates are unacceptable for Xpert Xpress SARS-CoV-2/FLU/RSV testing.  Fact Sheet for Patients: EntrepreneurPulse.com.au  Fact Sheet for Healthcare Providers: IncredibleEmployment.be  This test is not yet approved or cleared by the [place] FDA and has been authorized for detection and/or diagnosis of SARS-CoV-2 by FDA under an Emergency Use Authorization (EUA). This EUA will remain in effect (meaning this test can be used) for the duration of the COVID-19 declaration under Section 564(b)(1) of the Act, 21 U.S.C. section 360bbb-3(b)(1), unless the authorization is terminated or revoked.  Performed at [facility], [place] [address] [place], [place] [address]   Respiratory (~20 pathogens) panel by PCR     Status: None   Collection Time: 02/17/22 12:47 PM   Specimen: Nasopharyngeal Swab; Respiratory  Result Value Ref Range Status   Adenovirus NOT DETECTED NOT DETECTED Final   Coronavirus 229E NOT DETECTED NOT  DETECTED Final    Comment: (NOTE) The Coronavirus on the Respiratory Panel, DOES NOT test for the novel  Coronavirus (2019 nCoV)    Coronavirus HKU1 NOT DETECTED NOT DETECTED Final   Coronavirus NL63 NOT DETECTED NOT DETECTED Final   Coronavirus OC43 NOT DETECTED NOT DETECTED Final   Metapneumovirus NOT DETECTED NOT DETECTED Final   Rhinovirus / Enterovirus NOT DETECTED NOT DETECTED Final   Influenza A NOT DETECTED NOT DETECTED Final   Influenza B NOT DETECTED NOT DETECTED Final   Parainfluenza Virus 1 NOT DETECTED NOT DETECTED Final   Parainfluenza Virus 2 NOT DETECTED NOT DETECTED Final   Parainfluenza Virus 3 NOT DETECTED NOT DETECTED Final   Parainfluenza  Virus 4 NOT DETECTED NOT DETECTED Final   Respiratory Syncytial Virus NOT DETECTED NOT DETECTED Final   Bordetella pertussis NOT DETECTED NOT DETECTED Final   Bordetella Parapertussis NOT DETECTED NOT DETECTED Final   Chlamydophila pneumoniae NOT DETECTED NOT DETECTED Final   Mycoplasma pneumoniae NOT DETECTED NOT DETECTED Final    Comment: Performed at [facility], [address] [place], [place] [address]  Culture, blood (Routine x 2)     Status: None (Preliminary result)   Collection Time: 02/20/22  4:07 PM   Specimen: BLOOD  Result Value Ref Range Status   Specimen Description BLOOD RIGHT ANTECUBITAL  Final   Special Requests   Final    BOTTLES DRAWN AEROBIC AND ANAEROBIC Blood Culture adequate volume   Culture   Final    NO GROWTH < 24 HOURS Performed at [facility], [address] [place], [place] [address]    Report Status PENDING  Incomplete  Resp Panel by RT-PCR (Flu A&B, Covid) Nasopharyngeal Swab     Status: None   Collection Time: 02/20/22  4:32 PM   Specimen: Nasopharyngeal Swab; Nasopharyngeal(NP) swabs in vial transport medium  Result Value Ref Range Status   SARS Coronavirus 2 by RT PCR NEGATIVE NEGATIVE Final    Comment: (NOTE) SARS-CoV-2 target nucleic acids are NOT DETECTED.  The  SARS-CoV-2 RNA is generally detectable in upper respiratory specimens during the acute phase of infection. The lowest concentration of SARS-CoV-2 viral copies this assay can detect is 138 copies/mL. A negative result does not preclude SARS-Cov-2 infection and should not be used as the sole basis for treatment or other patient management decisions. A negative result may occur with  improper specimen collection/handling, submission of specimen other than nasopharyngeal swab, presence of viral mutation(s) within the areas targeted by this assay, and inadequate number of viral copies(<138 copies/mL). A negative result must be combined with clinical observations, patient history, and epidemiological information. The expected result is Negative.  Fact Sheet for Patients:  BloggerCourse.comhttps://www.fda.gov/media/152166/download  Fact Sheet for Healthcare Providers:  SeriousBroker.ithttps://www.fda.gov/media/152162/download  This test is no t yet approved or cleared by the [place] FDA and  has been authorized for detection and/or diagnosis of SARS-CoV-2 by FDA under an Emergency Use Authorization (EUA). This EUA will remain  in effect (meaning this test can be used) for the duration of the COVID-19 declaration under Section 564(b)(1) of the Act, 21 U.S.C.section 360bbb-3(b)(1), unless the authorization is terminated  or revoked sooner.       Influenza A by PCR NEGATIVE NEGATIVE Final   Influenza B by PCR NEGATIVE NEGATIVE Final    Comment: (NOTE) The Xpert Xpress SARS-CoV-2/FLU/RSV plus assay is intended as an aid in the diagnosis of influenza from Nasopharyngeal swab specimens and should not be used as a sole basis for treatment. Nasal washings and aspirates are unacceptable for Xpert Xpress SARS-CoV-2/FLU/RSV testing.  Fact Sheet for Patients: BloggerCourse.comhttps://www.fda.gov/media/152166/download  Fact Sheet for Healthcare Providers: SeriousBroker.ithttps://www.fda.gov/media/152162/download  This test is not yet approved or  cleared by the [place] FDA and has been authorized for detection and/or diagnosis of SARS-CoV-2 by FDA under an Emergency Use Authorization (EUA). This EUA will remain in effect (meaning this test can be used) for the duration of the COVID-19 declaration under Section 564(b)(1) of the Act, 21 U.S.C. section 360bbb-3(b)(1), unless the authorization is terminated or revoked.  Performed at [facility], [address] [place], [place] [address]   Culture, blood (Routine x 2)  Status: None (Preliminary result)   Collection Time: 02/20/22  6:14 PM   Specimen: BLOOD  Result Value Ref Range Status   Specimen Description BLOOD RIGHT ANTECUBITAL  Final   Special Requests   Final    BOTTLES DRAWN AEROBIC AND ANAEROBIC Blood Culture adequate volume   Culture   Final    NO GROWTH < 24 HOURS Performed at [facility], [address], [place], [place] [address]    Report Status PENDING  Incomplete    Antimicrobials: Anti-infectives (From admission, onward)    Start     Dose/Rate Route Frequency Ordered Stop   02/21/22 1000  cefTRIAXone (ROCEPHIN) 1 g in sodium chloride 0.9 % 100 mL IVPB        1 g 200 mL/hr over 30 Minutes Intravenous Every 24 hours 02/20/22 2037     02/20/22 2015  cefTRIAXone (ROCEPHIN) 2 g in sodium chloride 0.9 % 100 mL IVPB        2 g 200 mL/hr over 30 Minutes Intravenous  Once 02/20/22 2012 02/20/22 2133      Culture/Microbiology    Component Value Date/Time   SDES BLOOD RIGHT ANTECUBITAL 02/20/2022 1814   SPECREQUEST  02/20/2022 1814    BOTTLES DRAWN AEROBIC AND ANAEROBIC Blood Culture adequate volume   CULT  02/20/2022 1814    NO GROWTH < 24 HOURS Performed at [place] [address] [place], [place] [address]    REPTSTATUS PENDING 02/20/2022 1814    Other culture-see note  Radiology Studies: DG Chest 2 View  Result Date: 02/20/2022 CLINICAL DATA:  Suspected sepsis.  Weakness. EXAM: CHEST - 2 VIEW COMPARISON:  02/17/2022  FINDINGS: Reverse lordotic positioning. Allowing for this, no change is appreciated. Heart size is normal. 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 Question bronchial thickening pattern without consolidation, collapse or effusion. Bony structures are unremarkable. IMPRESSION: No appreciable change. Question bronchitis pattern. No consolidation or collapse. Electronically Signed   By: Nelson Chimes M.D.   On: 02/20/2022 16:30     LOS: 0 days   Antonieta Pert, MD [facility]  02/21/2022, 1:34 PM

## 2022-02-21 NOTE — ED Notes (Signed)
Patient continues to rest comfortably. Respirations even and unlabored, afebrile and no complaints of pain at this time. Bed in low and locked position and call bell within reach. NIBP, cardiac monitor and pulse ox remain in place.  ?

## 2022-02-21 NOTE — Evaluation (Signed)
Occupational Therapy Evaluation ?Patient Details ?Name: Lauren Henry ?MRN: [id number] ?DOB: [birth year] ?Today's Date: 02/21/2022 ? ? ?History of Present Illness 81 y.o. female readmitted on 3/2 was referred to PT with syncopal episode at home after recent admission for dizziness and weakness.  Pt is noted to have fever, to be dizzy and intolerant of standing, ABT started for UTI.  PMHx:  low back pain, complete heart block status post pacemaker, atrial fibrillation, hypothyroidism, hyperlipidemia, HTN, gastroenteropathy, CKD 3, CAD, anemia  ? ?Clinical Impression ?  ?Patient admitted for the above diagnosis.  PTA she lives with her spouse, who has been providing increasing assist for ADL, IADL, mobility support, medications and community mobility.  Deficits impacting independence are listed below.  Currently she is needing up to Mod A for basic mobility and lower body ADL.  OT will follow in the acute setting to improve functional status, and the patient wishes to return home with [facility] therapies.    ?   ? ?Recommendations for follow up therapy are one component of a multi-disciplinary discharge planning process, led by the attending physician.  Recommendations may be updated based on patient status, additional functional criteria and insurance authorization.  ? ?Follow Up Recommendations ? Home health OT  ?  ?Assistance Recommended at Discharge Intermittent Supervision/Assistance  ?Patient can return home with the following A little help with walking and/or transfers;A little help with bathing/dressing/bathroom;Assistance with cooking/housework ? ?  ?Functional Status Assessment ? Patient has had a recent decline in their functional status and demonstrates the ability to make significant improvements in function in a reasonable and predictable amount of time.  ?Equipment Recommendations ? None recommended by OT  ?  ?Recommendations for Other Services   ? ? ?  ?Precautions / Restrictions Precautions ?Precautions:  Fall ?Restrictions ?Weight Bearing Restrictions: No  ? ?  ? ?Mobility Bed Mobility ?Overal bed mobility: Needs Assistance ?Bed Mobility: Supine to Sit, Sit to Supine ?  ?  ?Supine to sit: Mod assist ?  ?  ?  ?  ? ?Transfers ?  ?Equipment used: Rolling walker (2 wheels) ?Transfers: Sit to/from Stand, Bed to chair/wheelchair/BSC ?Sit to Stand: Min assist ?  ?  ?Step pivot transfers: Min assist ?  ?  ?  ?  ? ?  ?Balance Overall balance assessment: Needs assistance ?Sitting-balance support: Feet supported ?Sitting balance-Leahy Scale: Fair ?  ?  ?Standing balance support: Reliant on assistive device for balance ?Standing balance-Leahy Scale: Poor ?  ?  ?  ?  ?  ?  ?  ?  ?  ?  ?  ?  ?   ? ?ADL either performed or assessed with clinical judgement  ? ?ADL   ?  ?  ?Grooming: Wash/dry hands;Wash/dry face;Set up;Sitting ?  ?  ?  ?  ?  ?Upper Body Dressing : Minimal assistance;Sitting ?  ?Lower Body Dressing: Moderate assistance;Sitting/lateral leans ?  ?  ?  ?  ?  ?  ?  ?  ?   ? ? ? ?Vision Baseline Vision/History: 1 Wears glasses ?Patient Visual Report: No change from baseline ?   ?   ?Perception Perception ?Perception: Within Functional Limits ?  ?Praxis Praxis ?Praxis: Intact ?  ? ?Pertinent Vitals/Pain Pain Assessment ?Pain Assessment: No/denies pain  ? ? ? ?Hand Dominance Right ?  ?Extremity/Trunk Assessment Upper Extremity Assessment ?Upper Extremity Assessment: Generalized weakness ?  ?Lower Extremity Assessment ?Lower Extremity Assessment: Defer to PT evaluation ?  ?Cervical / Trunk Assessment ?Cervical / Trunk  Assessment: Kyphotic ?  ?Communication Communication ?Communication: No difficulties ?  ?Cognition Arousal/Alertness: Awake/alert ?Behavior During Therapy: [facility] for tasks assessed/performed ?Overall Cognitive Status: Within Functional Limits for tasks assessed ?  ?  ?  ?  ?  ?  ?  ?  ?  ?  ?  ?  ?  ?  ?  ?  ?  ?  ?  ?General Comments    ? ?  ?Exercises   ?  ?Shoulder Instructions    ? ? ?Home Living  Family/patient expects to be discharged to:: Private residence ?Living Arrangements: Spouse/significant other ?Available Help at Discharge: Family;Available PRN/intermittently ?Type of Home: House ?Home Access: Ramped entrance ?  ?Entrance Stairs-Rails: Can reach both ?Home Layout: One level ?  ?  ?Bathroom Shower/Tub: Walk-in shower ?  ?Bathroom Toilet: Handicapped height ?Bathroom Accessibility: Yes ?How Accessible: Accessible via walker ?Home Equipment: Conservation officer, nature (2 wheels);Cane - single point;Grab bars - toilet;Grab bars - tub/shower;Hand held shower head ?  ?  ?  ? ?  ?Prior Functioning/Environment Prior Level of Function : Independent/Modified Independent ?  ?  ?  ?  ?  ?  ?Mobility Comments: RW since last admit, but SPC as well ?ADLs Comments: husband has been assisting with lower body ADL, IADL community mobility and medications ?  ? ?  ?  ?OT Problem List: Decreased strength;Decreased activity tolerance;Impaired balance (sitting and/or standing);Decreased safety awareness ?  ?   ?OT Treatment/Interventions: Self-care/ADL training;Balance training;Therapeutic activities;DME and/or AE instruction;Patient/family education  ?  ?OT Goals(Current goals can be found in the care plan section) Acute Rehab OT Goals ?Patient Stated Goal: Feel stronger ?OT Goal Formulation: With patient ?Time For Goal Achievement: 03/07/22 ?Potential to Achieve Goals: Good ?ADL Goals ?Pt Will Perform Grooming: with supervision;standing ?Pt Will Perform Lower Body Bathing: with supervision;sit to/from stand ?Pt Will Perform Lower Body Dressing: with supervision;sit to/from stand ?Pt Will Transfer to Toilet: with supervision;ambulating;regular height toilet  ?OT Frequency: Min 2X/week ?  ? ?Co-evaluation   ?  ?  ?  ?  ? ?  ?AM-PAC OT "6 Clicks" Daily Activity     ?Outcome Measure Help from another person eating meals?: None ?Help from another person taking care of personal grooming?: A Little ?Help from another person toileting,  which includes using toliet, bedpan, or urinal?: A Little ?Help from another person bathing (including washing, rinsing, drying)?: A Lot ?Help from another person to put on and taking off regular upper body clothing?: A Little ?Help from another person to put on and taking off regular lower body clothing?: A Lot ?6 Click Score: 17 ?  ?End of Session Equipment Utilized During Treatment: Gait belt;Rolling walker (2 wheels) ?Nurse Communication: Mobility status ? ?Activity Tolerance: Patient tolerated treatment well ?Patient left: in chair;with call bell/phone within reach;with family/visitor present ? ?OT Visit Diagnosis: Muscle weakness (generalized) (M62.81);Unsteadiness on feet (R26.81);Dizziness and giddiness (R42)  ?              ?Time: [id number] ?OT Time Calculation (min): 18 min ?Charges:  OT General Charges ?$OT Visit: 1 Visit ?OT Evaluation ?$OT Eval Moderate Complexity: 1 Mod ? ?02/21/2022 ? ?RP, OTR/L ? ?Acute Rehabilitation Services ? ?Office:  [phone] ? ? ?Arthuro Canelo D Gabryella Murfin ?02/21/2022, 5:26 PM ?

## 2022-02-21 NOTE — Assessment & Plan Note (Signed)
On Eliquis for anticoagulation on Coreg will decrease the dose. ?

## 2022-02-21 NOTE — ED Notes (Signed)
Pacemaker was interrogated. 

## 2022-02-21 NOTE — ED Notes (Signed)
Report handed off to Kelly RN.  ?

## 2022-02-21 NOTE — Assessment & Plan Note (Addendum)
Diastolic dysfunction in echo in 2019 ?Monitor fluid status, no chest pain continue beta-blocker statin and Eliquis. ?

## 2022-02-21 NOTE — Assessment & Plan Note (Signed)
Continue Synthroid °

## 2022-02-22 ENCOUNTER — Inpatient Hospital Stay (HOSPITAL_COMMUNITY): Payer: Medicare PPO

## 2022-02-22 DIAGNOSIS — I442 Atrioventricular block, complete: Secondary | ICD-10-CM

## 2022-02-22 DIAGNOSIS — I10 Essential (primary) hypertension: Secondary | ICD-10-CM

## 2022-02-22 DIAGNOSIS — R55 Syncope and collapse: Secondary | ICD-10-CM | POA: Diagnosis not present

## 2022-02-22 DIAGNOSIS — I2510 Atherosclerotic heart disease of native coronary artery without angina pectoris: Secondary | ICD-10-CM | POA: Diagnosis not present

## 2022-02-22 LAB — BASIC METABOLIC PANEL
Anion gap: 10 (ref 5–15)
BUN: 21 mg/dL (ref 8–23)
CO2: 19 mmol/L — ABNORMAL LOW (ref 22–32)
Calcium: 8.5 mg/dL — ABNORMAL LOW (ref 8.9–10.3)
Chloride: 106 mmol/L (ref 98–111)
Creatinine, Ser: 1.67 mg/dL — ABNORMAL HIGH (ref 0.44–1.00)
GFR, Estimated: 31 mL/min — ABNORMAL LOW (ref >60)
Glucose, Bld: 108 mg/dL — ABNORMAL HIGH (ref 70–99)
Potassium: 3.1 mmol/L — ABNORMAL LOW (ref 3.5–5.1)
Sodium: 135 mmol/L (ref 135–145)

## 2022-02-22 LAB — ECHOCARDIOGRAM COMPLETE
AR max vel: 1.79 cm2
AV Area VTI: 1.82 cm2
AV Area mean vel: 1.72 cm2
AV Mean grad: 8 mmHg
AV Peak grad: 15.5 mmHg
Ao pk vel: 1.97 m/s
Area-P 1/2: 3.3 cm2
Height: 64 in
S' Lateral: 2.1 cm
Weight: 2751.34 oz

## 2022-02-22 MED ORDER — POTASSIUM CHLORIDE CRYS ER 20 MEQ PO TBCR
40.0000 meq | EXTENDED_RELEASE_TABLET | ORAL | Status: AC
  Administered 2022-02-22 (×2): 40 meq via ORAL
  Filled 2022-02-22 (×2): qty 2

## 2022-02-22 NOTE — Progress Notes (Signed)
PROGRESS NOTE        PATIENT DETAILS Name: Lauren Henry Age: 81 y.o. Sex: female Date of Birth: Oct 08, 1941 Admit Date: 02/20/2022 Admitting Physician Antonieta Pert, MD [id number], Fransico Him, MD  Brief Summary: 81 year old female with history of PAF, complete heart block-s/p PPM placement, HFpEF who was recently hospitalized for orthostatic hypotension-presented to the hospital for evaluation of syncopal episode in the setting of UTI.  See below for further details.  Significant Hospital events: 2/27-2/28>> hospitalization for orthostatic hypotension-clonidine/hydralazine and Aldactone discontinued. 3/2>> admit for syncope/UTI and AKI.  Significant imaging studies: 3/2>> CXR: No PNA  Significant microbiology data: 3/2>> COVID/influenza PCR: Negative 3/2>> blood cultures: No growth  Procedures: None  Consults:  None  Subjective: Lying comfortably in bed-denies any chest pain or shortness of breath.  Objective: Vitals: Blood pressure (!) 147/69, pulse 88, temperature 97.8 F (36.6 C), temperature source Oral, resp. rate 17, height 5\' 4"  (1.626 m), weight 78 kg, SpO2 96 %.   Exam: Gen Exam:Alert awake-not in any distress HEENT:atraumatic, normocephalic Chest: B/L clear to auscultation anteriorly CVS:S1S2 regular Abdomen:soft non tender, non distended Extremities:no edema Neurology: Non focal Skin: no rash  Pertinent Labs/Radiology: CBC Latest Ref Rng & Units 02/20/2022 02/18/2022 02/17/2022  WBC 4.0 - 10.5 K/uL 3.2(L) 1.5(L) 1.4(LL)  Hemoglobin 12.0 - 15.0 g/dL 10.0(L) 9.5(L) 10.7(L)  Hematocrit 36.0 - 46.0 % 30.7(L) 28.2(L) 32.0(L)  Platelets 150 - 400 K/uL 186 148(L) 162    Lab Results  Component Value Date   NA 135 02/22/2022   K 3.1 (L) 02/22/2022   CL 106 02/22/2022   CO2 19 (L) 02/22/2022      Assessment/Plan: Syncope: Suspicion for orthostatic hypotension in the setting of UTI.  Pacemaker interrogated by cardiology-no arrhythmias  seen.  Await echo-continue telemetry monitoring.  Orthostatic hypotension: Volume status stable-stopping IVF-no longer on any antihypertensives.  Recheck orthostatic vital signs today.  [id number] improving-unfortunately urine cultures never sent.  Blood cultures negative to date.  Plan on at least 5 days of empiric antimicrobial therapy.  AKI on CKD stage IIIb: AKI likely hemodynamically mediated in the setting of UTI-creatinine improving with supportive care.  Volume status appears stable-should be okay to stop IVF today.  HFpEF: Volume status stable-watch closely.  CAD: No anginal symptoms-continue beta-blocker/statin-not on aspirin as on Eliquis.  PAF: Paced rhythm-on low-dose beta-blocker.  Remains on Eliquis.  CHADS2 Vascor of 5  Complete heart block-s/p Medtronic PPM: Device interrogated by cardiology-no arrhythmias to account for syncope.  HTN: Blood pressure labile-apart from beta-blocker-all other antihypertensives/diuretics on hold.  HLD: Continue statin  Hypothyroidism: continue Synthroid  BMI: Estimated body mass index is 29.52 kg/m as calculated from the following:   Height as of this encounter: 5\' 4"  (1.626 m).   Weight as of this encounter: 78 kg.   Code status:   Code Status: Full Code   DVT Prophylaxis: Place TED hose Start: 02/21/22 1604 apixaban (ELIQUIS) tablet 2.5 mg Start: 02/20/22 2200 apixaban (ELIQUIS) tablet 2.5 mg    Family Communication: Lauren Henry [phone] left voicemail on 3/4   Disposition Plan: Status is: Inpatient Remains inpatient appropriate because: Resolving AKI-UTI on IV antibiotics-ongoing orthostatic hypotension-needs further optimization of her medications-stopping IVF-before consideration of discharge.  Likely discharge in the next 1-2 days.   Planned Discharge Destination:Home health   Diet: Diet Order  Diet Heart Room service appropriate? Yes; Fluid consistency: Thin  Diet effective now                      Antimicrobial agents: Anti-infectives (From admission, onward)    Start     Dose/Rate Route Frequency Ordered Stop   02/21/22 1000  cefTRIAXone (ROCEPHIN) 1 g in sodium chloride 0.9 % 100 mL IVPB        1 g 200 mL/hr over 30 Minutes Intravenous Every 24 hours 02/20/22 2037     02/20/22 2015  cefTRIAXone (ROCEPHIN) 2 g in sodium chloride 0.9 % 100 mL IVPB        2 g 200 mL/hr over 30 Minutes Intravenous  Once 02/20/22 2012 02/20/22 2133        MEDICATIONS: Scheduled Meds:  apixaban  2.5 mg Oral BID   ferrous sulfate  325 mg Oral Q breakfast   levothyroxine  150 mcg Oral QAC breakfast   metoprolol tartrate  12.5 mg Oral BID   mirtazapine  7.5 mg Oral QHS   potassium chloride  40 mEq Oral Q4H   rosuvastatin  40 mg Oral Daily   Continuous Infusions:  sodium chloride 75 mL/hr at 02/21/22 2126   cefTRIAXone (ROCEPHIN)  IV Stopped (02/21/22 1044)   PRN Meds:.acetaminophen **OR** acetaminophen, lip balm   I have personally reviewed following labs and imaging studies  LABORATORY DATA: CBC: Recent Labs  Lab 02/17/22 1315 02/17/22 1433 02/18/22 0422 02/20/22 1607  WBC 1.4* 1.4* 1.5* 3.2*  NEUTROABS  --  0.9*  --  2.5  HGB 10.6* 10.7* 9.5* 10.0*  HCT 32.3* 32.0* 28.2* 30.7*  MCV 89.7 89.1 87.6 89.8  PLT 157 162 148* [id number]    Basic Metabolic Panel: Recent Labs  Lab 02/17/22 1315 02/18/22 0422 02/20/22 1607 02/21/22 0340 02/22/22 0137  NA 133* 133* 130* 136 135  K 3.9 3.8 4.0 3.7 3.1*  CL 100 102 97* 108 106  CO2 22 22 23  18* 19*  GLUCOSE 145* 93 118* 109* 108*  BUN 29* 25* 27* 27* 21  CREATININE 2.29* 2.11* 2.13* 2.05* 1.67*  CALCIUM 9.9 9.6 9.2 8.8* 8.5*    GFR: Estimated Creatinine Clearance: 27.1 mL/min (A) (by C-G formula based on SCr of 1.67 mg/dL (H)).  Liver Function Tests: Recent Labs  Lab 02/17/22 1520 02/18/22 0422 02/20/22 1607  AST 52* 43* 97*  ALT 58* 48* 80*  ALKPHOS 49 39 46  BILITOT 0.6 0.8 0.4  PROT 7.6  7.0 7.1  ALBUMIN 3.0* 2.6* 2.7*   No results for input(s): LIPASE, AMYLASE in the last 168 hours. No results for input(s): AMMONIA in the last 168 hours.  Coagulation Profile: Recent Labs  Lab 02/20/22 1607  INR 1.9*    Cardiac Enzymes: No results for input(s): CKTOTAL, CKMB, CKMBINDEX, TROPONINI in the last 168 hours.  BNP (last 3 results) No results for input(s): PROBNP in the last 8760 hours.  Lipid Profile: No results for input(s): CHOL, HDL, LDLCALC, TRIG, CHOLHDL, LDLDIRECT in the last 72 hours.  Thyroid Function Tests: No results for input(s): TSH, T4TOTAL, FREET4, T3FREE, THYROIDAB in the last 72 hours.  Anemia Panel: No results for input(s): VITAMINB12, FOLATE, FERRITIN, TIBC, IRON, RETICCTPCT in the last 72 hours.  Urine analysis:    Component Value Date/Time   COLORURINE AMBER (A) 02/20/2022 1954   APPEARANCEUR CLOUDY (A) 02/20/2022 1954   LABSPEC 1.011 02/20/2022 1954   PHURINE 5.0 02/20/2022 [place] NEGATIVE 02/20/2022 1954  HGBUR MODERATE (A) 02/20/2022 1954   BILIRUBINUR NEGATIVE 02/20/2022 1954   KETONESUR NEGATIVE 02/20/2022 1954   PROTEINUR 100 (A) 02/20/2022 1954   UROBILINOGEN 0.2 10/26/2015 0957   NITRITE NEGATIVE 02/20/2022 1954   LEUKOCYTESUR LARGE (A) 02/20/2022 1954    Sepsis Labs: Lactic Acid, Venous    Component Value Date/Time   LATICACIDVEN 1.2 02/20/2022 1607    MICROBIOLOGY: Recent Results (from the past 240 hour(s))  Resp Panel by RT-PCR (Flu A&B, Covid) Nasopharyngeal Swab     Status: None   Collection Time: 02/17/22 12:47 PM   Specimen: Nasopharyngeal Swab; Nasopharyngeal(NP) swabs in vial transport medium  Result Value Ref Range Status   SARS Coronavirus 2 by RT PCR NEGATIVE NEGATIVE Final    Comment: (NOTE) SARS-CoV-2 target nucleic acids are NOT DETECTED.  The SARS-CoV-2 RNA is generally detectable in upper respiratory specimens during the acute phase of infection. The lowest concentration of SARS-CoV-2  viral copies this assay can detect is 138 copies/mL. A negative result does not preclude SARS-Cov-2 infection and should not be used as the sole basis for treatment or other patient management decisions. A negative result may occur with  improper specimen collection/handling, submission of specimen other than nasopharyngeal swab, presence of viral mutation(s) within the areas targeted by this assay, and inadequate number of viral copies(<138 copies/mL). A negative result must be combined with clinical observations, patient history, and epidemiological information. The expected result is Negative.  Fact Sheet for Patients:  BloggerCourse.com  Fact Sheet for Healthcare Providers:  SeriousBroker.it  This test is no t yet approved or cleared by the [place] FDA and  has been authorized for detection and/or diagnosis of SARS-CoV-2 by FDA under an Emergency Use Authorization (EUA). This EUA will remain  in effect (meaning this test can be used) for the duration of the COVID-19 declaration under Section 564(b)(1) of the Act, 21 U.S.C.section 360bbb-3(b)(1), unless the authorization is terminated  or revoked sooner.       Influenza A by PCR NEGATIVE NEGATIVE Final   Influenza B by PCR NEGATIVE NEGATIVE Final    Comment: (NOTE) The Xpert Xpress SARS-CoV-2/FLU/RSV plus assay is intended as an aid in the diagnosis of influenza from Nasopharyngeal swab specimens and should not be used as a sole basis for treatment. Nasal washings and aspirates are unacceptable for Xpert Xpress SARS-CoV-2/FLU/RSV testing.  Fact Sheet for Patients: BloggerCourse.com  Fact Sheet for Healthcare Providers: SeriousBroker.it  This test is not yet approved or cleared by the [place] FDA and has been authorized for detection and/or diagnosis of SARS-CoV-2 by FDA under an Emergency Use Authorization  (EUA). This EUA will remain in effect (meaning this test can be used) for the duration of the COVID-19 declaration under Section 564(b)(1) of the Act, 21 U.S.C. section 360bbb-3(b)(1), unless the authorization is terminated or revoked.  Performed at [facility], [address] [place], [place] [address]   Respiratory (~20 pathogens) panel by PCR     Status: None   Collection Time: 02/17/22 12:47 PM   Specimen: Nasopharyngeal Swab; Respiratory  Result Value Ref Range Status   Adenovirus NOT DETECTED NOT DETECTED Final   Coronavirus 229E NOT DETECTED NOT DETECTED Final    Comment: (NOTE) The Coronavirus on the Respiratory Panel, DOES NOT test for the novel  Coronavirus (2019 nCoV)    Coronavirus HKU1 NOT DETECTED NOT DETECTED Final   Coronavirus NL63 NOT DETECTED NOT DETECTED Final   Coronavirus OC43 NOT DETECTED NOT DETECTED Final  Metapneumovirus NOT DETECTED NOT DETECTED Final   Rhinovirus / Enterovirus NOT DETECTED NOT DETECTED Final   Influenza A NOT DETECTED NOT DETECTED Final   Influenza B NOT DETECTED NOT DETECTED Final   Parainfluenza Virus 1 NOT DETECTED NOT DETECTED Final   Parainfluenza Virus 2 NOT DETECTED NOT DETECTED Final   Parainfluenza Virus 3 NOT DETECTED NOT DETECTED Final   Parainfluenza Virus 4 NOT DETECTED NOT DETECTED Final   Respiratory Syncytial Virus NOT DETECTED NOT DETECTED Final   Bordetella pertussis NOT DETECTED NOT DETECTED Final   Bordetella Parapertussis NOT DETECTED NOT DETECTED Final   Chlamydophila pneumoniae NOT DETECTED NOT DETECTED Final   Mycoplasma pneumoniae NOT DETECTED NOT DETECTED Final    Comment: Performed at [facility], [place] [address] [place], [place] [address]  Culture, blood (Routine x 2)     Status: None (Preliminary result)   Collection Time: 02/20/22  4:07 PM   Specimen: BLOOD  Result Value Ref Range Status   Specimen Description BLOOD RIGHT ANTECUBITAL  Final   Special Requests   Final    BOTTLES DRAWN  AEROBIC AND ANAEROBIC Blood Culture adequate volume   Culture   Final    NO GROWTH < 24 HOURS Performed at [facility], [place] [address], [place], [place] [address]    Report Status PENDING  Incomplete  Resp Panel by RT-PCR (Flu A&B, Covid) Nasopharyngeal Swab     Status: None   Collection Time: 02/20/22  4:32 PM   Specimen: Nasopharyngeal Swab; Nasopharyngeal(NP) swabs in vial transport medium  Result Value Ref Range Status   SARS Coronavirus 2 by RT PCR NEGATIVE NEGATIVE Final    Comment: (NOTE) SARS-CoV-2 target nucleic acids are NOT DETECTED.  The SARS-CoV-2 RNA is generally detectable in upper respiratory specimens during the acute phase of infection. The lowest concentration of SARS-CoV-2 viral copies this assay can detect is 138 copies/mL. A negative result does not preclude SARS-Cov-2 infection and should not be used as the sole basis for treatment or other patient management decisions. A negative result may occur with  improper specimen collection/handling, submission of specimen other than nasopharyngeal swab, presence of viral mutation(s) within the areas targeted by this assay, and inadequate number of viral copies(<138 copies/mL). A negative result must be combined with clinical observations, patient history, and epidemiological information. The expected result is Negative.  Fact Sheet for Patients:  EntrepreneurPulse.com.au  Fact Sheet for Healthcare Providers:  IncredibleEmployment.be  This test is no t yet approved or cleared by the [place] FDA and  has been authorized for detection and/or diagnosis of SARS-CoV-2 by FDA under an Emergency Use Authorization (EUA). This EUA will remain  in effect (meaning this test can be used) for the duration of the COVID-19 declaration under Section 564(b)(1) of the Act, 21 U.S.C.section 360bbb-3(b)(1), unless the authorization is terminated  or revoked sooner.       Influenza A  by PCR NEGATIVE NEGATIVE Final   Influenza B by PCR NEGATIVE NEGATIVE Final    Comment: (NOTE) The Xpert Xpress SARS-CoV-2/FLU/RSV plus assay is intended as an aid in the diagnosis of influenza from Nasopharyngeal swab specimens and should not be used as a sole basis for treatment. Nasal washings and aspirates are unacceptable for Xpert Xpress SARS-CoV-2/FLU/RSV testing.  Fact Sheet for Patients: EntrepreneurPulse.com.au  Fact Sheet for Healthcare Providers: IncredibleEmployment.be  This test is not yet approved or cleared by the [place] FDA and has been authorized for detection and/or diagnosis of SARS-CoV-2  by FDA under an Emergency Use Authorization (EUA). This EUA will remain in effect (meaning this test can be used) for the duration of the COVID-19 declaration under Section 564(b)(1) of the Act, 21 U.S.C. section 360bbb-3(b)(1), unless the authorization is terminated or revoked.  Performed at [facility], [place] [address] [place], [place] [address]   Culture, blood (Routine x 2)     Status: None (Preliminary result)   Collection Time: 02/20/22  6:14 PM   Specimen: BLOOD  Result Value Ref Range Status   Specimen Description BLOOD RIGHT ANTECUBITAL  Final   Special Requests   Final    BOTTLES DRAWN AEROBIC AND ANAEROBIC Blood Culture adequate volume   Culture   Final    NO GROWTH < 24 HOURS Performed at [facility], [place] [address], [place], [place] [address]    Report Status PENDING  Incomplete    RADIOLOGY STUDIES/RESULTS: DG Chest 2 View  Result Date: 02/20/2022 CLINICAL DATA:  Suspected sepsis.  Weakness. EXAM: CHEST - 2 VIEW COMPARISON:  02/17/2022 FINDINGS: Reverse lordotic positioning. Allowing for this, no change is appreciated. Heart size is normal. 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 Question bronchial thickening pattern without consolidation, collapse or effusion. Bony structures are unremarkable.  IMPRESSION: No appreciable change. Question bronchitis pattern. No consolidation or collapse. Electronically Signed   By: Nelson Chimes M.D.   On: 02/20/2022 16:30     LOS: 1 day   Oren Binet, MD  [facility]    To contact the attending provider between 7A-7P or the covering provider during after hours 7P-7A, please log into the web site [email or web address] and access using universal [place] password for that web site. If you do not have the password, please call the hospital operator.  02/22/2022, 9:27 AM

## 2022-02-22 NOTE — Progress Notes (Signed)
?  Echocardiogram ?2D Echocardiogram has been performed. ? ?Lauren Henry ?02/22/2022, 12:39 PM ?

## 2022-02-23 DIAGNOSIS — I10 Essential (primary) hypertension: Secondary | ICD-10-CM | POA: Diagnosis not present

## 2022-02-23 DIAGNOSIS — N3000 Acute cystitis without hematuria: Secondary | ICD-10-CM | POA: Diagnosis not present

## 2022-02-23 DIAGNOSIS — I442 Atrioventricular block, complete: Secondary | ICD-10-CM | POA: Diagnosis not present

## 2022-02-23 DIAGNOSIS — R55 Syncope and collapse: Secondary | ICD-10-CM | POA: Diagnosis not present

## 2022-02-23 LAB — CBC
HCT: 29.2 % — ABNORMAL LOW (ref 36.0–46.0)
Hemoglobin: 9.4 g/dL — ABNORMAL LOW (ref 12.0–15.0)
MCH: 28.9 pg (ref 26.0–34.0)
MCHC: 32.2 g/dL (ref 30.0–36.0)
MCV: 89.8 fL (ref 80.0–100.0)
Platelets: 182 10*3/uL (ref 150–400)
RBC: 3.25 MIL/uL — ABNORMAL LOW (ref 3.87–5.11)
RDW: 13.8 % (ref 11.5–15.5)
WBC: 3.2 10*3/uL — ABNORMAL LOW (ref 4.0–10.5)
nRBC: 0 % (ref 0.0–0.2)

## 2022-02-23 LAB — BASIC METABOLIC PANEL
Anion gap: 9 (ref 5–15)
BUN: 19 mg/dL (ref 8–23)
CO2: 19 mmol/L — ABNORMAL LOW (ref 22–32)
Calcium: 9.2 mg/dL (ref 8.9–10.3)
Chloride: 108 mmol/L (ref 98–111)
Creatinine, Ser: 1.63 mg/dL — ABNORMAL HIGH (ref 0.44–1.00)
GFR, Estimated: 32 mL/min — ABNORMAL LOW (ref >60)
Glucose, Bld: 99 mg/dL (ref 70–99)
Potassium: 4 mmol/L (ref 3.5–5.1)
Sodium: 136 mmol/L (ref 135–145)

## 2022-02-23 LAB — MAGNESIUM: Magnesium: 1.5 mg/dL — ABNORMAL LOW (ref 1.7–2.4)

## 2022-02-23 MED ORDER — METOPROLOL TARTRATE 25 MG PO TABS: 12.5000 mg | ORAL_TABLET | Freq: Two times a day (BID) | ORAL | 3 refills | Status: DC

## 2022-02-23 MED ORDER — CEFDINIR 300 MG PO CAPS: 300.0000 mg | ORAL_CAPSULE | Freq: Every day | ORAL | 0 refills | Status: DC

## 2022-02-23 MED ORDER — METOPROLOL TARTRATE 25 MG PO TABS: 12.5000 mg | ORAL_TABLET | Freq: Two times a day (BID) | ORAL | 3 refills | Status: AC

## 2022-02-23 NOTE — TOC Transition Note (Addendum)
Transition of Care (TOC) - CM/SW Discharge Note ? ? ?Patient Details  ?Name: Lauren Henry ?MRN: [id number] ?Date of Birth: October 18, 1941 ? ?Transition of Care (TOC) CM/SW Contact:  ?Lawerance Sabal, RN ?Phone Number: ?02/23/2022, 10:22 AM ? ? ?Clinical Narrative:   Patient will DC to home with resumption of HH services through [facility] Liaison has been notified of DC. ?WC to be delivered to the room ? ?Update- DME request made over 2 hours ago and not delivered to the room. Spoke w [employer] and WC is being brought to the hospital from the warehouse. Patient left, [employer] will call spouse to see if they would like it delivered to the home.  ? ?No other TOC needs identified.  ? ? ? ?Final next level of care: Home w Home Health Services ?Barriers to Discharge: No Barriers Identified ? ? ?Patient Goals and CMS Choice ?  ?  ?  ? ?Discharge Placement ?  ?           ?  ?  ?  ?  ? ?Discharge Plan and Services ?  ?  ?           ?  ?  ?  ?  ?  ?  ?HH Agency: [facility] ?Date HH Agency Contacted: 02/23/22 ?Time HH Agency Contacted: 1022 ?Representative spoke with at [facility] Agency: Candise Bowens ? ?Social Determinants of Health (SDOH) Interventions ?  ? ? ?Readmission Risk Interventions ?No flowsheet data found. ? ? ? ? ?

## 2022-02-23 NOTE — Progress Notes (Signed)
[name] to be D/C'd home per MD order. Discussed with the patient and husband and all questions fully answered.  ?IV catheter discontinued intact. Site without signs and symptoms of complications. Dressing and pressure applied.  ?An After Visit Summary was printed and given to the patient. Confirmed with husband that their church will provide a wheelchair for the patient. ?Patient escorted via WC, and D/C home via private auto.  ?Jon Gills  ?02/23/2022 1:03 PM ?  ?   ?

## 2022-02-23 NOTE — Care Management (Signed)
?  ?  Durable Medical Equipment  ?(From admission, onward)  ?  ? ? ?  ? ?  Start     Ordered  ? 02/23/22 1036  For home use only DME lightweight manual wheelchair with seat cushion  Once       ?Comments: Patient suffers from weakness which impairs their ability to perform daily activities like bathing in the home.  A cane will not resolve  ?issue with performing activities of daily living. A wheelchair will allow patient to safely perform daily activities. Patient is not able to propel themselves in the home using a standard weight wheelchair due to general weakness. Patient can self propel in the lightweight wheelchair. Length of need Lifetime. ?Accessories: elevating leg rests (ELRs), wheel locks, extensions and anti-tippers.  ? 02/23/22 1035  ? ?  ?  ? ?  ?  ?

## 2022-02-23 NOTE — Discharge Summary (Signed)
PATIENT DETAILS Name: Lauren Henry Age: 81 y.o. Sex: female Date of Birth: 1941/04/14 MRN: [id number]. Admitting Physician: Antonieta Pert, MD [id number], Fransico Him, MD  Admit Date: 02/20/2022 Discharge date: 02/23/2022  Recommendations for Outpatient Follow-up:  Follow up with PCP in 1-2 weeks Please obtain CMP/CBC in one week  Admitted From:  Home  Disposition: Home health   Discharge Condition: fair  CODE STATUS:   Code Status: Full Code   Diet recommendation:  Diet Order             Diet - low sodium heart healthy           Diet Heart Room service appropriate? Yes; Fluid consistency: Thin  Diet effective now                    Brief Summary: 81 year old female with history of PAF, complete heart block-s/p PPM placement, HFpEF who was recently hospitalized for orthostatic hypotension-presented to the hospital for evaluation of syncopal episode in the setting of UTI.  See below for further details.   Significant Hospital events: 2/27-2/28>> hospitalization for orthostatic hypotension-clonidine/hydralazine and Aldactone discontinued. 3/2>> admit for syncope/UTI and AKI.   Significant imaging studies: 3/2>> CXR: No PNA 3/4>> Echo: EF 55 to [id number], grade 1 diastolic dysfunction.   Significant microbiology data: 3/2>> COVID/influenza PCR: Negative 3/2>> blood cultures: No growth   Procedures: None   Consults:  Cardiology   Brief Hospital Course: Syncope: Suspicion for orthostatic hypotension in the setting of UTI.  Pacemaker interrogated by cardiology-no arrhythmias seen.  Echo with preserved EF.   Orthostatic hypotension: Volume status stable-managed with IVF and switching from Coreg to metoprolol.  Orthostatic vital signs repeated yesterday-no significant drop in BP.   [id number] improving-unfortunately urine cultures never sent.  Blood cultures negative to date.  Plan on at least 5 days of empiric antimicrobial therapy.  On IV Rocephin  during this hospitalization-we will transition to [facility] on discharge.   AKI on CKD stage IIIb: AKI likely hemodynamically mediated in the setting of UTI-managed with supportive care including IVF-creatinine has stabilized and close to baseline.     HFpEF: Volume status stable  CAD: No anginal symptoms-continue beta-blocker/statin-not on aspirin as on Eliquis.   PAF: Paced rhythm-on low-dose beta-blocker.  Remains on Eliquis.  CHADS2 Vascor of 5   Complete heart block-s/p Medtronic PPM: Device interrogated by cardiology-no arrhythmias to account for syncope.   HTN: Blood pressure labile-apart from beta-blocker-all other antihypertensives/diuretics on hold.   HLD: Continue statin   Hypothyroidism: continue Synthroid   BMI: Estimated body mass index is 29.52 kg/m as calculated from the following:   Height as of this encounter: 5\' 4"  (1.626 m).   Weight as of this encounter: 78 kg.     Discharge Diagnoses:  Principal Problem:   Syncope and collapse Active Problems:   UTI (urinary tract infection)   Essential hypertension   Hypothyroidism   CHB (complete heart block) (HCC)   PAF (paroxysmal atrial fibrillation) (HCC)   CAD in native artery   Syncope   Stage 3b chronic kidney disease (CKD) ([place])   Discharge Instructions:  Activity:  As tolerated with Full fall precautions use walker/cane & assistance as needed   Discharge Instructions     Call MD for:  persistant dizziness or light-headedness   Complete by: As directed    Call MD for:  persistant nausea and vomiting   Complete by: As directed    Diet - low sodium heart healthy  Complete by: As directed    Discharge instructions   Complete by: As directed    Follow with Primary MD  Tisovec, Fransico Him, MD in 1-2 weeks  Please get a complete blood count and chemistry panel checked by your Primary MD at your next visit, and again as instructed by your Primary MD.  Get Medicines reviewed and adjusted: Please take  all your medications with you for your next visit with your Primary MD  Laboratory/radiological data: Please request your Primary MD to go over all hospital tests and procedure/radiological results at the follow up, please ask your Primary MD to get all Hospital records sent to his/her office.  In some cases, they will be blood work, cultures and biopsy results pending at the time of your discharge. Please request that your primary care M.D. follows up on these results.  Also Note the following: If you experience worsening of your admission symptoms, develop shortness of breath, life threatening emergency, suicidal or homicidal thoughts you must seek medical attention immediately by calling 911 or calling your MD immediately  if symptoms less severe.  You must read complete instructions/literature along with all the possible adverse reactions/side effects for all the Medicines you take and that have been prescribed to you. Take any new Medicines after you have completely understood and accpet all the possible adverse reactions/side effects.   Do not drive when taking Pain medications or sleeping medications (Benzodaizepines)  Do not take more than prescribed Pain, Sleep and Anxiety Medications. It is not advisable to combine anxiety,sleep and pain medications without talking with your primary care practitioner  Special Instructions: If you have smoked or chewed Tobacco  in the last 2 yrs please stop smoking, stop any regular Alcohol  and or any Recreational drug use.  Wear Seat belts while driving.  Please note: You were cared for by a hospitalist during your hospital stay. Once you are discharged, your primary care physician will handle any further medical issues. Please note that NO REFILLS for any discharge medications will be authorized once you are discharged, as it is imperative that you return to your primary care physician (or establish a relationship with a primary care physician if you  do not have one) for your post hospital discharge needs so that they can reassess your need for medications and monitor your lab values.   Increase activity slowly   Complete by: As directed       Allergies as of 02/23/2022       Reactions   Olmesartan Other (See Comments)   enteritis   Amlodipine Swelling, Rash        Medication List     STOP taking these medications    carvedilol 20 MG 24 hr capsule Commonly known as: COREG CR       TAKE these medications    apixaban 2.5 MG Tabs tablet Commonly known as: Eliquis Take 1 tablet (2.5 mg total) by mouth 2 (two) times daily.   cefdinir 300 MG capsule Commonly known as: OMNICEF Take 1 capsule (300 mg total) by mouth daily.   dorzolamide 2 % ophthalmic solution Commonly known as: TRUSOPT Place 1 drop into the left eye 2 (two) times daily.   ferrous sulfate 325 (65 FE) MG tablet Take 325 mg by mouth daily with breakfast.   ipratropium 0.03 % nasal spray Commonly known as: ATROVENT Place 2 sprays into both nostrils 2 (two) times daily.   levothyroxine 150 MCG tablet Commonly known as: SYNTHROID Take 150  mcg by mouth daily before breakfast.   metoprolol tartrate 25 MG tablet Commonly known as: LOPRESSOR Take 0.5 tablets (12.5 mg total) by mouth 2 (two) times daily.   mirtazapine 7.5 MG tablet Commonly known as: REMERON Take 7.5 mg by mouth at bedtime.   multivitamin with minerals Tabs tablet Take 1 tablet by mouth daily.   ondansetron 4 MG tablet Commonly known as: ZOFRAN Take 4 mg by mouth every 8 (eight) hours as needed.   pantoprazole 40 MG tablet Commonly known as: PROTONIX Take 40 mg by mouth daily.   rosuvastatin 40 MG tablet Commonly known as: CRESTOR TAKE 1 TABLET BY MOUTH DAILY   vitamin C 250 MG tablet Commonly known as: ASCORBIC ACID Take 250 mg by mouth daily.   VITAMIN D (CHOLECALCIFEROL) PO Take 1 tablet by mouth daily.        Follow-up Information     Tisovec, Fransico Him, MD.  Schedule an appointment as soon as possible for a visit in 1 week(s).   Specialty: Internal Medicine Contact information: [place] [address] [phone]         Skeet Latch, MD Follow up in 1 week(s).   Specialty: Cardiology Contact information: [address] [place] [address] [phone]         Evans Lance, MD Follow up in 1 month(s).   Specialty: Cardiology Contact information: [id number] [place] [address] [phone]                Allergies  Allergen Reactions   Olmesartan Other (See Comments)    enteritis   Amlodipine Swelling and Rash     Other Procedures/Studies: DG Chest 2 View  Result Date: 02/20/2022 CLINICAL DATA:  Suspected sepsis.  Weakness. EXAM: CHEST - 2 VIEW COMPARISON:  02/17/2022 FINDINGS: Reverse lordotic positioning. Allowing for this, no change is appreciated. Heart size is normal. 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 Question bronchial thickening pattern without consolidation, collapse or effusion. Bony structures are unremarkable. IMPRESSION: No appreciable change. Question bronchitis pattern. No consolidation or collapse. Electronically Signed   By: Nelson Chimes M.D.   On: 02/20/2022 16:30   DG Chest 2 View  Result Date: 02/17/2022 CLINICAL DATA:  Cough, generalized malaise since Saturday, dizziness since yesterday, loss of appetite; history hypertension, atrial fibrillation EXAM: CHEST - 2 VIEW COMPARISON:  10/30/2018 FINDINGS: LEFT subclavian sequential transvenous pacemaker leads project at RIGHT atrium and RIGHT ventricle. Normal heart size, mediastinal contours, and pulmonary vascularity. Mitral annular calcification and atherosclerotic calcification aorta noted. Bronchitic changes without pulmonary infiltrate, pleural effusion, or pneumothorax. Osseous structures demineralized. IMPRESSION: Bronchitic changes without infiltrate. Aortic Atherosclerosis  (ICD10-I70.0). Electronically Signed   By: Lavonia Dana M.D.   On: 02/17/2022 14:08   CT Head Wo Contrast  Result Date: 02/17/2022 CLINICAL DATA:  Dizziness.  Weakness.  General malaise. EXAM: CT HEAD WITHOUT CONTRAST TECHNIQUE: Contiguous axial images were obtained from the base of the skull through the vertex without intravenous contrast. RADIATION DOSE REDUCTION: This exam was performed according to the departmental dose-optimization program which includes automated exposure control, adjustment of the mA and/or kV according to patient size and/or use of iterative reconstruction technique. COMPARISON:  05/26/2020 FINDINGS: Brain: No evidence of acute infarction, hemorrhage, hydrocephalus, extra-axial collection or mass lesion/mass effect. Vascular: No hyperdense vessel or unexpected calcification. Skull: Normal. Negative for fracture or focal lesion. Sinuses/Orbits: There is chronic mucoperiosteal thickening with partially calcified in septated mucous is associated with the  right maxillary sinus. Mild mucosal thickening is noted within the left maxillary sinus. Other: None. IMPRESSION: 1. No acute intracranial abnormalities. 2. Chronic bilateral maxillary sinus inflammation. Electronically Signed   By: Kerby Moors M.D.   On: 02/17/2022 15:58   ECHOCARDIOGRAM COMPLETE  Result Date: 02/22/2022    ECHOCARDIOGRAM REPORT   Patient Name:   Lauren Henry Date of Exam: 02/22/2022 Medical Rec #:  [place]:[id number]      Height:       64.0 in Accession #:    [id number]     Weight:       172.0 lb Date of Birth:  30-Nov-1941       BSA:          1.835 m Patient Age:    21 years       BP:           97/85 mmHg Patient Gender: F              HR:           62 bpm. Exam Location:  Inpatient Procedure: 2D Echo, Cardiac Doppler and Color Doppler Indications:    Syncope R55  History:        Patient has prior history of Echocardiogram examinations, most                 recent 10/31/2018.  Sonographer:    Merrie Roof RDCS Referring Phys:  [id number] [place]  1. Left ventricular ejection fraction, by estimation, is 55 to 60%. The left ventricle has normal function. The left ventricle has no regional wall motion abnormalities. There is moderate concentric left ventricular hypertrophy. Left ventricular diastolic parameters are consistent with Grade I diastolic dysfunction (impaired relaxation).  2. Right ventricular systolic function is normal. The right ventricular size is normal. There is normal pulmonary artery systolic pressure.  3. Left atrial size was mild to moderately dilated.  4. The mitral valve is abnormal. No evidence of mitral valve regurgitation.  5. The aortic valve is calcified. Aortic valve regurgitation is not visualized. Mild aortic valve stenosis. FINDINGS  Left Ventricle: Left ventricular ejection fraction, by estimation, is 55 to 60%. The left ventricle has normal function. The left ventricle has no regional wall motion abnormalities. The left ventricular internal cavity size was normal in size. There is  moderate concentric left ventricular hypertrophy. Left ventricular diastolic parameters are consistent with Grade I diastolic dysfunction (impaired relaxation). Right Ventricle: The right ventricular size is normal. Right vetricular wall thickness was not well visualized. Right ventricular systolic function is normal. There is normal pulmonary artery systolic pressure. The tricuspid regurgitant velocity is 2.50 m/s, and with an assumed right atrial pressure of 3 mmHg, the estimated right ventricular systolic pressure is [id number] mmHg. Left Atrium: Left atrial size was mild to moderately dilated. Right Atrium: Right atrial size was normal in size. Pericardium: There is no evidence of pericardial effusion. Mitral Valve: The mitral valve is abnormal. There is mild thickening of the mitral valve leaflet(s). There is mild calcification of the mitral valve leaflet(s). Mild to moderate mitral annular calcification. No  evidence of mitral valve regurgitation. Tricuspid Valve: The tricuspid valve is grossly normal. Tricuspid valve regurgitation is trivial. Aortic Valve: The aortic valve is calcified. Aortic valve regurgitation is not visualized. Mild aortic stenosis is present. Aortic valve mean gradient measures 8.0 mmHg. Aortic valve peak gradient measures 15.5 mmHg. Aortic valve area, by VTI measures 1.82 cm. Pulmonic Valve: The pulmonic valve was grossly  normal. Pulmonic valve regurgitation is trivial. Aorta: The aortic root and ascending aorta are structurally normal, with no evidence of dilitation. IAS/Shunts: The atrial septum is grossly normal.  LEFT VENTRICLE PLAX 2D LVIDd:         2.80 cm   Diastology LVIDs:         2.10 cm   LV e' medial:    5.33 cm/s LV PW:         1.40 cm   LV E/e' medial:  15.6 LV IVS:        1.40 cm   LV e' lateral:   4.68 cm/s LVOT diam:     2.00 cm   LV E/e' lateral: 17.8 LV SV:         58 LV SV Index:   32 LVOT Area:     3.14 cm  RIGHT VENTRICLE RV Basal diam:  3.00 cm RV S prime:     18.40 cm/s TAPSE (M-mode): 2.7 cm LEFT ATRIUM             Index        RIGHT ATRIUM           Index LA diam:        4.40 cm 2.40 cm/m   RA Area:     14.30 cm LA Vol (A2C):   99.4 ml 54.18 ml/m  RA Volume:   31.40 ml  17.11 ml/m LA Vol (A4C):   49.9 ml 27.20 ml/m LA Biplane Vol: 73.8 ml 40.22 ml/m  AORTIC VALVE AV Area (Vmax):    1.79 cm AV Area (Vmean):   1.72 cm AV Area (VTI):     1.82 cm AV Vmax:           197.00 cm/s AV Vmean:          132.000 cm/s AV VTI:            0.321 m AV Peak Grad:      15.5 mmHg AV Mean Grad:      8.0 mmHg LVOT Vmax:         112.00 cm/s LVOT Vmean:        72.100 cm/s LVOT VTI:          0.186 m LVOT/AV VTI ratio: 0.58  AORTA Ao Root diam: 3.30 cm Ao Asc diam:  3.00 cm MITRAL VALVE                TRICUSPID VALVE MV Area (PHT): 3.30 cm     TR Peak grad:   25.0 mmHg MV Decel Time: 230 msec     TR Vmax:        250.00 cm/s MV E velocity: 83.10 cm/s MV A velocity: 142.00 cm/s  SHUNTS  MV E/A ratio:  0.59         Systemic VTI:  0.19 m                             Systemic Diam: 2.00 cm Mertie Moores MD Electronically signed by Mertie Moores MD Signature Date/Time: 02/22/2022/2:10:50 PM    Final    CUP PACEART REMOTE DEVICE CHECK  Result Date: 02/04/2022 Scheduled remote reviewed. Normal device function.  Short AMS, longest 2 min Next remote 91 days- JJB    TODAY-DAY OF DISCHARGE:  Subjective:   Lauren Henry today has no headache,no chest abdominal pain,no new weakness tingling or numbness, feels much better wants to go home today.  Objective:   Blood pressure 140/70, pulse 93, temperature 97.9 F (36.6 C), temperature source Oral, resp. rate (!) 22, height 5\' 4"  (1.626 m), weight 78 kg, SpO2 95 %.  Intake/Output Summary (Last 24 hours) at 02/23/2022 1004 Last data filed at 02/22/2022 2134 Gross per 24 hour  Intake 360 ml  Output 600 ml  Net -240 ml   Filed Weights   02/21/22 0027  Weight: 78 kg    Exam: Awake Alert, Oriented *3, No new F.N deficits, Normal affect [place].AT,PERRAL Supple Neck,No JVD, No cervical lymphadenopathy appriciated.  Symmetrical Chest wall movement, Good air movement bilaterally, CTAB RRR,No Gallops,Rubs or new Murmurs, No Parasternal Heave +ve B.Sounds, Abd Soft, Non tender, No organomegaly appriciated, No rebound -guarding or rigidity. No Cyanosis, Clubbing or edema, No new Rash or bruise   PERTINENT RADIOLOGIC STUDIES: ECHOCARDIOGRAM COMPLETE  Result Date: 02/22/2022    ECHOCARDIOGRAM REPORT   Patient Name:   Lauren Henry Date of Exam: 02/22/2022 Medical Rec #:  [place]:[id number]      Height:       64.0 in Accession #:    [id number]     Weight:       172.0 lb Date of Birth:  1940-12-28       BSA:          1.835 m Patient Age:    20 years       BP:           97/85 mmHg Patient Gender: F              HR:           62 bpm. Exam Location:  Inpatient Procedure: 2D Echo, Cardiac Doppler and Color Doppler Indications:    Syncope R55  History:         Patient has prior history of Echocardiogram examinations, most                 recent 10/31/2018.  Sonographer:    Merrie Roof RDCS Referring Phys: [id number] [place]  1. Left ventricular ejection fraction, by estimation, is 55 to 60%. The left ventricle has normal function. The left ventricle has no regional wall motion abnormalities. There is moderate concentric left ventricular hypertrophy. Left ventricular diastolic parameters are consistent with Grade I diastolic dysfunction (impaired relaxation).  2. Right ventricular systolic function is normal. The right ventricular size is normal. There is normal pulmonary artery systolic pressure.  3. Left atrial size was mild to moderately dilated.  4. The mitral valve is abnormal. No evidence of mitral valve regurgitation.  5. The aortic valve is calcified. Aortic valve regurgitation is not visualized. Mild aortic valve stenosis. FINDINGS  Left Ventricle: Left ventricular ejection fraction, by estimation, is 55 to 60%. The left ventricle has normal function. The left ventricle has no regional wall motion abnormalities. The left ventricular internal cavity size was normal in size. There is  moderate concentric left ventricular hypertrophy. Left ventricular diastolic parameters are consistent with Grade I diastolic dysfunction (impaired relaxation). Right Ventricle: The right ventricular size is normal. Right vetricular wall thickness was not well visualized. Right ventricular systolic function is normal. There is normal pulmonary artery systolic pressure. The tricuspid regurgitant velocity is 2.50 m/s, and with an assumed right atrial pressure of 3 mmHg, the estimated right ventricular systolic pressure is Q000111Q mmHg. Left Atrium: Left atrial size was mild to moderately dilated. Right Atrium: Right atrial size was normal in size. Pericardium: There is no evidence of  pericardial effusion. Mitral Valve: The mitral valve is abnormal. There is mild  thickening of the mitral valve leaflet(s). There is mild calcification of the mitral valve leaflet(s). Mild to moderate mitral annular calcification. No evidence of mitral valve regurgitation. Tricuspid Valve: The tricuspid valve is grossly normal. Tricuspid valve regurgitation is trivial. Aortic Valve: The aortic valve is calcified. Aortic valve regurgitation is not visualized. Mild aortic stenosis is present. Aortic valve mean gradient measures 8.0 mmHg. Aortic valve peak gradient measures 15.5 mmHg. Aortic valve area, by VTI measures 1.82 cm. Pulmonic Valve: The pulmonic valve was grossly normal. Pulmonic valve regurgitation is trivial. Aorta: The aortic root and ascending aorta are structurally normal, with no evidence of dilitation. IAS/Shunts: The atrial septum is grossly normal.  LEFT VENTRICLE PLAX 2D LVIDd:         2.80 cm   Diastology LVIDs:         2.10 cm   LV e' medial:    5.33 cm/s LV PW:         1.40 cm   LV E/e' medial:  15.6 LV IVS:        1.40 cm   LV e' lateral:   4.68 cm/s LVOT diam:     2.00 cm   LV E/e' lateral: 17.8 LV SV:         58 LV SV Index:   32 LVOT Area:     3.14 cm  RIGHT VENTRICLE RV Basal diam:  3.00 cm RV S prime:     18.40 cm/s TAPSE (M-mode): 2.7 cm LEFT ATRIUM             Index        RIGHT ATRIUM           Index LA diam:        4.40 cm 2.40 cm/m   RA Area:     14.30 cm LA Vol (A2C):   99.4 ml 54.18 ml/m  RA Volume:   31.40 ml  17.11 ml/m LA Vol (A4C):   49.9 ml 27.20 ml/m LA Biplane Vol: 73.8 ml 40.22 ml/m  AORTIC VALVE AV Area (Vmax):    1.79 cm AV Area (Vmean):   1.72 cm AV Area (VTI):     1.82 cm AV Vmax:           197.00 cm/s AV Vmean:          132.000 cm/s AV VTI:            0.321 m AV Peak Grad:      15.5 mmHg AV Mean Grad:      8.0 mmHg LVOT Vmax:         112.00 cm/s LVOT Vmean:        72.100 cm/s LVOT VTI:          0.186 m LVOT/AV VTI ratio: 0.58  AORTA Ao Root diam: 3.30 cm Ao Asc diam:  3.00 cm MITRAL VALVE                TRICUSPID VALVE MV Area (PHT): 3.30  cm     TR Peak grad:   25.0 mmHg MV Decel Time: 230 msec     TR Vmax:        250.00 cm/s MV E velocity: 83.10 cm/s MV A velocity: 142.00 cm/s  SHUNTS MV E/A ratio:  0.59         Systemic VTI:  0.19 m  Systemic Diam: 2.00 cm Mertie Moores MD Electronically signed by Mertie Moores MD Signature Date/Time: 02/22/2022/2:10:50 PM    Final      PERTINENT LAB RESULTS: CBC: Recent Labs    02/20/22 1607 02/23/22 0118  WBC 3.2* 3.2*  HGB 10.0* 9.4*  HCT 30.7* 29.2*  PLT 186 182   CMET CMP     Component Value Date/Time   NA 136 02/23/2022 0118   NA 137 06/06/2021 0821   K 4.0 02/23/2022 0118   CL 108 02/23/2022 0118   CO2 19 (L) 02/23/2022 0118   GLUCOSE 99 02/23/2022 0118   BUN 19 02/23/2022 0118   BUN 30 (H) 06/06/2021 0821   CREATININE 1.63 (H) 02/23/2022 0118   CALCIUM 9.2 02/23/2022 0118   PROT 7.1 02/20/2022 1607   PROT 6.9 06/06/2021 0821   ALBUMIN 2.7 (L) 02/20/2022 1607   ALBUMIN 4.6 06/06/2021 0821   AST 97 (H) 02/20/2022 1607   ALT 80 (H) 02/20/2022 1607   ALKPHOS 46 02/20/2022 1607   BILITOT 0.4 02/20/2022 1607   BILITOT 0.6 06/06/2021 0821   GFRNONAA 32 (L) 02/23/2022 0118   GFRAA 36 (L) 08/28/2020 0524    GFR Estimated Creatinine Clearance: 27.8 mL/min (A) (by C-G formula based on SCr of 1.63 mg/dL (H)). No results for input(s): LIPASE, AMYLASE in the last 72 hours. No results for input(s): CKTOTAL, CKMB, CKMBINDEX, TROPONINI in the last 72 hours. Invalid input(s): POCBNP No results for input(s): DDIMER in the last 72 hours. No results for input(s): HGBA1C in the last 72 hours. No results for input(s): CHOL, HDL, LDLCALC, TRIG, CHOLHDL, LDLDIRECT in the last 72 hours. No results for input(s): TSH, T4TOTAL, T3FREE, THYROIDAB in the last 72 hours.  Invalid input(s): FREET3 No results for input(s): VITAMINB12, FOLATE, FERRITIN, TIBC, IRON, RETICCTPCT in the last 72 hours. Coags: Recent Labs    02/20/22 1607  INR 1.9*    Microbiology: Recent Results (from the past 240 hour(s))  Resp Panel by RT-PCR (Flu A&B, Covid) Nasopharyngeal Swab     Status: None   Collection Time: 02/17/22 12:47 PM   Specimen: Nasopharyngeal Swab; Nasopharyngeal(NP) swabs in vial transport medium  Result Value Ref Range Status   SARS Coronavirus 2 by RT PCR NEGATIVE NEGATIVE Final    Comment: (NOTE) SARS-CoV-2 target nucleic acids are NOT DETECTED.  The SARS-CoV-2 RNA is generally detectable in upper respiratory specimens during the acute phase of infection. The lowest concentration of SARS-CoV-2 viral copies this assay can detect is 138 copies/mL. A negative result does not preclude SARS-Cov-2 infection and should not be used as the sole basis for treatment or other patient management decisions. A negative result may occur with  improper specimen collection/handling, submission of specimen other than nasopharyngeal swab, presence of viral mutation(s) within the areas targeted by this assay, and inadequate number of viral copies(<138 copies/mL). A negative result must be combined with clinical observations, patient history, and epidemiological information. The expected result is Negative.  Fact Sheet for Patients:  EntrepreneurPulse.com.au  Fact Sheet for Healthcare Providers:  IncredibleEmployment.be  This test is no t yet approved or cleared by the [place] FDA and  has been authorized for detection and/or diagnosis of SARS-CoV-2 by FDA under an Emergency Use Authorization (EUA). This EUA will remain  in effect (meaning this test can be used) for the duration of the COVID-19 declaration under Section 564(b)(1) of the Act, 21 U.S.C.section 360bbb-3(b)(1), unless the authorization is terminated  or revoked sooner.  Influenza A by PCR NEGATIVE NEGATIVE Final   Influenza B by PCR NEGATIVE NEGATIVE Final    Comment: (NOTE) The Xpert Xpress SARS-CoV-2/FLU/RSV plus assay is  intended as an aid in the diagnosis of influenza from Nasopharyngeal swab specimens and should not be used as a sole basis for treatment. Nasal washings and aspirates are unacceptable for Xpert Xpress SARS-CoV-2/FLU/RSV testing.  Fact Sheet for Patients: EntrepreneurPulse.com.au  Fact Sheet for Healthcare Providers: IncredibleEmployment.be  This test is not yet approved or cleared by the [place] FDA and has been authorized for detection and/or diagnosis of SARS-CoV-2 by FDA under an Emergency Use Authorization (EUA). This EUA will remain in effect (meaning this test can be used) for the duration of the COVID-19 declaration under Section 564(b)(1) of the Act, 21 U.S.C. section 360bbb-3(b)(1), unless the authorization is terminated or revoked.  Performed at [facility], [place] [address] [place], [place] [address]   Respiratory (~20 pathogens) panel by PCR     Status: None   Collection Time: 02/17/22 12:47 PM   Specimen: Nasopharyngeal Swab; Respiratory  Result Value Ref Range Status   Adenovirus NOT DETECTED NOT DETECTED Final   Coronavirus 229E NOT DETECTED NOT DETECTED Final    Comment: (NOTE) The Coronavirus on the Respiratory Panel, DOES NOT test for the novel  Coronavirus (2019 nCoV)    Coronavirus HKU1 NOT DETECTED NOT DETECTED Final   Coronavirus NL63 NOT DETECTED NOT DETECTED Final   Coronavirus OC43 NOT DETECTED NOT DETECTED Final   Metapneumovirus NOT DETECTED NOT DETECTED Final   Rhinovirus / Enterovirus NOT DETECTED NOT DETECTED Final   Influenza A NOT DETECTED NOT DETECTED Final   Influenza B NOT DETECTED NOT DETECTED Final   Parainfluenza Virus 1 NOT DETECTED NOT DETECTED Final   Parainfluenza Virus 2 NOT DETECTED NOT DETECTED Final   Parainfluenza Virus 3 NOT DETECTED NOT DETECTED Final   Parainfluenza Virus 4 NOT DETECTED NOT DETECTED Final   Respiratory Syncytial Virus NOT DETECTED NOT DETECTED Final    Bordetella pertussis NOT DETECTED NOT DETECTED Final   Bordetella Parapertussis NOT DETECTED NOT DETECTED Final   Chlamydophila pneumoniae NOT DETECTED NOT DETECTED Final   Mycoplasma pneumoniae NOT DETECTED NOT DETECTED Final    Comment: Performed at [facility], [place]. [address], [place], [place] [address]  Culture, blood (Routine x 2)     Status: None (Preliminary result)   Collection Time: 02/20/22  4:07 PM   Specimen: BLOOD  Result Value Ref Range Status   Specimen Description BLOOD RIGHT ANTECUBITAL  Final   Special Requests   Final    BOTTLES DRAWN AEROBIC AND ANAEROBIC Blood Culture adequate volume   Culture   Final    NO GROWTH 2 DAYS Performed at [facility], [place] [address] [place], [place] [address]    Report Status PENDING  Incomplete  Resp Panel by RT-PCR (Flu A&B, Covid) Nasopharyngeal Swab     Status: None   Collection Time: 02/20/22  4:32 PM   Specimen: Nasopharyngeal Swab; Nasopharyngeal(NP) swabs in vial transport medium  Result Value Ref Range Status   SARS Coronavirus 2 by RT PCR NEGATIVE NEGATIVE Final    Comment: (NOTE) SARS-CoV-2 target nucleic acids are NOT DETECTED.  The SARS-CoV-2 RNA is generally detectable in upper respiratory specimens during the acute phase of infection. The lowest concentration of SARS-CoV-2 viral copies this assay can detect is 138 copies/mL. A negative result does not preclude SARS-Cov-2 infection and should not be used  as the sole basis for treatment or other patient management decisions. A negative result may occur with  improper specimen collection/handling, submission of specimen other than nasopharyngeal swab, presence of viral mutation(s) within the areas targeted by this assay, and inadequate number of viral copies(<138 copies/mL). A negative result must be combined with clinical observations, patient history, and epidemiological information. The expected result is Negative.  Fact Sheet for Patients:   EntrepreneurPulse.com.au  Fact Sheet for Healthcare Providers:  IncredibleEmployment.be  This test is no t yet approved or cleared by the [place] FDA and  has been authorized for detection and/or diagnosis of SARS-CoV-2 by FDA under an Emergency Use Authorization (EUA). This EUA will remain  in effect (meaning this test can be used) for the duration of the COVID-19 declaration under Section 564(b)(1) of the Act, 21 U.S.C.section 360bbb-3(b)(1), unless the authorization is terminated  or revoked sooner.       Influenza A by PCR NEGATIVE NEGATIVE Final   Influenza B by PCR NEGATIVE NEGATIVE Final    Comment: (NOTE) The Xpert Xpress SARS-CoV-2/FLU/RSV plus assay is intended as an aid in the diagnosis of influenza from Nasopharyngeal swab specimens and should not be used as a sole basis for treatment. Nasal washings and aspirates are unacceptable for Xpert Xpress SARS-CoV-2/FLU/RSV testing.  Fact Sheet for Patients: EntrepreneurPulse.com.au  Fact Sheet for Healthcare Providers: IncredibleEmployment.be  This test is not yet approved or cleared by the [place] FDA and has been authorized for detection and/or diagnosis of SARS-CoV-2 by FDA under an Emergency Use Authorization (EUA). This EUA will remain in effect (meaning this test can be used) for the duration of the COVID-19 declaration under Section 564(b)(1) of the Act, 21 U.S.C. section 360bbb-3(b)(1), unless the authorization is terminated or revoked.  Performed at [facility], [place] [address] [place], [place] [address]   Culture, blood (Routine x 2)     Status: None (Preliminary result)   Collection Time: 02/20/22  6:14 PM   Specimen: BLOOD  Result Value Ref Range Status   Specimen Description BLOOD RIGHT ANTECUBITAL  Final   Special Requests   Final    BOTTLES DRAWN AEROBIC AND ANAEROBIC Blood Culture adequate volume   Culture    Final    NO GROWTH 2 DAYS Performed at [facility], [place] [address] [place], [place] [address]    Report Status PENDING  Incomplete    FURTHER DISCHARGE INSTRUCTIONS:  Get Medicines reviewed and adjusted: Please take all your medications with you for your next visit with your Primary MD  Laboratory/radiological data: Please request your Primary MD to go over all hospital tests and procedure/radiological results at the follow up, please ask your Primary MD to get all Hospital records sent to his/her office.  In some cases, they will be blood work, cultures and biopsy results pending at the time of your discharge. Please request that your primary care M.D. goes through all the records of your hospital data and follows up on these results.  Also Note the following: If you experience worsening of your admission symptoms, develop shortness of breath, life threatening emergency, suicidal or homicidal thoughts you must seek medical attention immediately by calling 911 or calling your MD immediately  if symptoms less severe.  You must read complete instructions/literature along with all the possible adverse reactions/side effects for all the Medicines you take and that have been prescribed to you. Take any new Medicines after you have completely understood and accpet all the  possible adverse reactions/side effects.   Do not drive when taking Pain medications or sleeping medications (Benzodaizepines)  Do not take more than prescribed Pain, Sleep and Anxiety Medications. It is not advisable to combine anxiety,sleep and pain medications without talking with your primary care practitioner  Special Instructions: If you have smoked or chewed Tobacco  in the last 2 yrs please stop smoking, stop any regular Alcohol  and or any Recreational drug use.  Wear Seat belts while driving.  Please note: You were cared for by a hospitalist during your hospital stay. Once you are discharged, your primary  care physician will handle any further medical issues. Please note that NO REFILLS for any discharge medications will be authorized once you are discharged, as it is imperative that you return to your primary care physician (or establish a relationship with a primary care physician if you do not have one) for your post hospital discharge needs so that they can reassess your need for medications and monitor your lab values.  Total Time spent coordinating discharge including counseling, education and face to face time equals greater than 30 minutes.  SignedOren Binet 02/23/2022 10:04 AM

## 2022-02-24 DIAGNOSIS — I700 Atherosclerosis of aorta: Secondary | ICD-10-CM | POA: Diagnosis not present

## 2022-02-24 DIAGNOSIS — I2510 Atherosclerotic heart disease of native coronary artery without angina pectoris: Secondary | ICD-10-CM | POA: Diagnosis not present

## 2022-02-24 DIAGNOSIS — D631 Anemia in chronic kidney disease: Secondary | ICD-10-CM | POA: Diagnosis not present

## 2022-02-24 DIAGNOSIS — I480 Paroxysmal atrial fibrillation: Secondary | ICD-10-CM | POA: Diagnosis not present

## 2022-02-24 DIAGNOSIS — N1832 Chronic kidney disease, stage 3b: Secondary | ICD-10-CM | POA: Diagnosis not present

## 2022-02-24 DIAGNOSIS — M1990 Unspecified osteoarthritis, unspecified site: Secondary | ICD-10-CM | POA: Diagnosis not present

## 2022-02-24 DIAGNOSIS — I5030 Unspecified diastolic (congestive) heart failure: Secondary | ICD-10-CM | POA: Diagnosis not present

## 2022-02-24 DIAGNOSIS — I130 Hypertensive heart and chronic kidney disease with heart failure and stage 1 through stage 4 chronic kidney disease, or unspecified chronic kidney disease: Secondary | ICD-10-CM | POA: Diagnosis not present

## 2022-02-24 DIAGNOSIS — N390 Urinary tract infection, site not specified: Secondary | ICD-10-CM | POA: Diagnosis not present

## 2022-02-25 LAB — CULTURE, BLOOD (ROUTINE X 2)
Culture: NO GROWTH
Special Requests: ADEQUATE

## 2022-02-26 LAB — CULTURE, BLOOD (ROUTINE X 2): Special Requests: ADEQUATE

## 2022-02-27 DIAGNOSIS — I951 Orthostatic hypotension: Secondary | ICD-10-CM | POA: Diagnosis not present

## 2022-02-27 DIAGNOSIS — E860 Dehydration: Secondary | ICD-10-CM | POA: Diagnosis not present

## 2022-02-27 DIAGNOSIS — I480 Paroxysmal atrial fibrillation: Secondary | ICD-10-CM | POA: Diagnosis not present

## 2022-02-27 DIAGNOSIS — Z950 Presence of cardiac pacemaker: Secondary | ICD-10-CM | POA: Diagnosis not present

## 2022-02-27 DIAGNOSIS — D631 Anemia in chronic kidney disease: Secondary | ICD-10-CM | POA: Diagnosis not present

## 2022-02-27 DIAGNOSIS — N184 Chronic kidney disease, stage 4 (severe): Secondary | ICD-10-CM | POA: Diagnosis not present

## 2022-02-27 DIAGNOSIS — N3000 Acute cystitis without hematuria: Secondary | ICD-10-CM | POA: Diagnosis not present

## 2022-02-27 DIAGNOSIS — I1310 Hypertensive heart and chronic kidney disease without heart failure, with stage 1 through stage 4 chronic kidney disease, or unspecified chronic kidney disease: Secondary | ICD-10-CM | POA: Diagnosis not present

## 2022-03-06 DIAGNOSIS — D6869 Other thrombophilia: Secondary | ICD-10-CM | POA: Diagnosis not present

## 2022-03-06 DIAGNOSIS — E039 Hypothyroidism, unspecified: Secondary | ICD-10-CM | POA: Diagnosis not present

## 2022-03-06 DIAGNOSIS — E7800 Pure hypercholesterolemia, unspecified: Secondary | ICD-10-CM | POA: Diagnosis not present

## 2022-03-06 DIAGNOSIS — R7989 Other specified abnormal findings of blood chemistry: Secondary | ICD-10-CM | POA: Diagnosis not present

## 2022-03-06 DIAGNOSIS — D692 Other nonthrombocytopenic purpura: Secondary | ICD-10-CM | POA: Diagnosis not present

## 2022-03-06 DIAGNOSIS — N184 Chronic kidney disease, stage 4 (severe): Secondary | ICD-10-CM | POA: Diagnosis not present

## 2022-03-06 DIAGNOSIS — Z7901 Long term (current) use of anticoagulants: Secondary | ICD-10-CM | POA: Diagnosis not present

## 2022-03-06 DIAGNOSIS — D631 Anemia in chronic kidney disease: Secondary | ICD-10-CM | POA: Diagnosis not present

## 2022-03-06 DIAGNOSIS — I951 Orthostatic hypotension: Secondary | ICD-10-CM | POA: Diagnosis not present

## 2022-03-07 ENCOUNTER — Observation Stay (HOSPITAL_COMMUNITY)
Admission: EM | Admit: 2022-03-07 | Discharge: 2022-03-09 | Disposition: A | Discharge status: Home Health | Payer: Medicare PPO | Attending: Family Medicine | Admitting: Family Medicine

## 2022-03-07 ENCOUNTER — Other Ambulatory Visit: Payer: Self-pay

## 2022-03-07 ENCOUNTER — Emergency Department (HOSPITAL_COMMUNITY): Payer: Medicare PPO

## 2022-03-07 ENCOUNTER — Encounter (HOSPITAL_COMMUNITY): Payer: Self-pay

## 2022-03-07 ENCOUNTER — Observation Stay (HOSPITAL_COMMUNITY): Payer: Medicare PPO

## 2022-03-07 DIAGNOSIS — Z950 Presence of cardiac pacemaker: Secondary | ICD-10-CM | POA: Insufficient documentation

## 2022-03-07 DIAGNOSIS — R7989 Other specified abnormal findings of blood chemistry: Secondary | ICD-10-CM | POA: Diagnosis not present

## 2022-03-07 DIAGNOSIS — R3129 Other microscopic hematuria: Secondary | ICD-10-CM

## 2022-03-07 DIAGNOSIS — Z79899 Other long term (current) drug therapy: Secondary | ICD-10-CM | POA: Diagnosis not present

## 2022-03-07 DIAGNOSIS — R292 Abnormal reflex: Secondary | ICD-10-CM | POA: Insufficient documentation

## 2022-03-07 DIAGNOSIS — E43 Unspecified severe protein-calorie malnutrition: Secondary | ICD-10-CM | POA: Insufficient documentation

## 2022-03-07 DIAGNOSIS — I422 Other hypertrophic cardiomyopathy: Secondary | ICD-10-CM | POA: Insufficient documentation

## 2022-03-07 DIAGNOSIS — E538 Deficiency of other specified B group vitamins: Secondary | ICD-10-CM | POA: Insufficient documentation

## 2022-03-07 DIAGNOSIS — K449 Diaphragmatic hernia without obstruction or gangrene: Secondary | ICD-10-CM | POA: Diagnosis not present

## 2022-03-07 DIAGNOSIS — M47814 Spondylosis without myelopathy or radiculopathy, thoracic region: Secondary | ICD-10-CM | POA: Diagnosis not present

## 2022-03-07 DIAGNOSIS — R29818 Other symptoms and signs involving the nervous system: Secondary | ICD-10-CM

## 2022-03-07 DIAGNOSIS — R531 Weakness: Secondary | ICD-10-CM | POA: Diagnosis not present

## 2022-03-07 DIAGNOSIS — N200 Calculus of kidney: Secondary | ICD-10-CM | POA: Insufficient documentation

## 2022-03-07 DIAGNOSIS — R627 Adult failure to thrive: Secondary | ICD-10-CM | POA: Diagnosis present

## 2022-03-07 DIAGNOSIS — E785 Hyperlipidemia, unspecified: Secondary | ICD-10-CM | POA: Diagnosis present

## 2022-03-07 DIAGNOSIS — R918 Other nonspecific abnormal finding of lung field: Secondary | ICD-10-CM | POA: Diagnosis not present

## 2022-03-07 DIAGNOSIS — E039 Hypothyroidism, unspecified: Secondary | ICD-10-CM | POA: Diagnosis present

## 2022-03-07 DIAGNOSIS — N1832 Chronic kidney disease, stage 3b: Secondary | ICD-10-CM | POA: Diagnosis present

## 2022-03-07 DIAGNOSIS — I10 Essential (primary) hypertension: Secondary | ICD-10-CM | POA: Diagnosis not present

## 2022-03-07 DIAGNOSIS — M6281 Muscle weakness (generalized): Secondary | ICD-10-CM | POA: Diagnosis not present

## 2022-03-07 DIAGNOSIS — I951 Orthostatic hypotension: Secondary | ICD-10-CM | POA: Diagnosis not present

## 2022-03-07 DIAGNOSIS — R778 Other specified abnormalities of plasma proteins: Secondary | ICD-10-CM | POA: Diagnosis not present

## 2022-03-07 DIAGNOSIS — Z20822 Contact with and (suspected) exposure to covid-19: Secondary | ICD-10-CM | POA: Diagnosis not present

## 2022-03-07 DIAGNOSIS — Z87440 Personal history of urinary (tract) infections: Secondary | ICD-10-CM | POA: Diagnosis not present

## 2022-03-07 DIAGNOSIS — I1310 Hypertensive heart and chronic kidney disease without heart failure, with stage 1 through stage 4 chronic kidney disease, or unspecified chronic kidney disease: Secondary | ICD-10-CM | POA: Diagnosis not present

## 2022-03-07 DIAGNOSIS — I480 Paroxysmal atrial fibrillation: Secondary | ICD-10-CM | POA: Diagnosis not present

## 2022-03-07 DIAGNOSIS — I442 Atrioventricular block, complete: Secondary | ICD-10-CM | POA: Diagnosis not present

## 2022-03-07 DIAGNOSIS — M4712 Other spondylosis with myelopathy, cervical region: Secondary | ICD-10-CM | POA: Diagnosis not present

## 2022-03-07 DIAGNOSIS — R911 Solitary pulmonary nodule: Secondary | ICD-10-CM | POA: Diagnosis not present

## 2022-03-07 DIAGNOSIS — I129 Hypertensive chronic kidney disease with stage 1 through stage 4 chronic kidney disease, or unspecified chronic kidney disease: Secondary | ICD-10-CM | POA: Insufficient documentation

## 2022-03-07 DIAGNOSIS — Z7901 Long term (current) use of anticoagulants: Secondary | ICD-10-CM | POA: Insufficient documentation

## 2022-03-07 DIAGNOSIS — Z96653 Presence of artificial knee joint, bilateral: Secondary | ICD-10-CM | POA: Diagnosis not present

## 2022-03-07 DIAGNOSIS — R251 Tremor, unspecified: Secondary | ICD-10-CM | POA: Insufficient documentation

## 2022-03-07 DIAGNOSIS — N12 Tubulo-interstitial nephritis, not specified as acute or chronic: Secondary | ICD-10-CM | POA: Diagnosis not present

## 2022-03-07 DIAGNOSIS — R259 Unspecified abnormal involuntary movements: Secondary | ICD-10-CM

## 2022-03-07 DIAGNOSIS — I6523 Occlusion and stenosis of bilateral carotid arteries: Secondary | ICD-10-CM | POA: Diagnosis not present

## 2022-03-07 DIAGNOSIS — I2510 Atherosclerotic heart disease of native coronary artery without angina pectoris: Secondary | ICD-10-CM | POA: Diagnosis not present

## 2022-03-07 DIAGNOSIS — Z8669 Personal history of other diseases of the nervous system and sense organs: Secondary | ICD-10-CM | POA: Diagnosis not present

## 2022-03-07 LAB — CBC WITH DIFFERENTIAL/PLATELET
Abs Immature Granulocytes: 0.89 10*3/uL — ABNORMAL HIGH (ref 0.00–0.07)
Basophils Absolute: 0.1 10*3/uL (ref 0.0–0.1)
Basophils Relative: 1 %
Eosinophils Absolute: 0.2 10*3/uL (ref 0.0–0.5)
Eosinophils Relative: 2 %
HCT: 34.4 % — ABNORMAL LOW (ref 36.0–46.0)
Hemoglobin: 11.2 g/dL — ABNORMAL LOW (ref 12.0–15.0)
Immature Granulocytes: 7 %
Lymphocytes Relative: 7 %
Lymphs Abs: 0.9 10*3/uL (ref 0.7–4.0)
MCH: 29.2 pg (ref 26.0–34.0)
MCHC: 32.6 g/dL (ref 30.0–36.0)
MCV: 89.6 fL (ref 80.0–100.0)
Monocytes Absolute: 0.5 10*3/uL (ref 0.1–1.0)
Monocytes Relative: 4 %
Neutro Abs: 9.5 10*3/uL — ABNORMAL HIGH (ref 1.7–7.7)
Neutrophils Relative %: 79 %
Platelets: 188 10*3/uL (ref 150–400)
RBC: 3.84 MIL/uL — ABNORMAL LOW (ref 3.87–5.11)
RDW: 15.3 % (ref 11.5–15.5)
WBC: 12 10*3/uL — ABNORMAL HIGH (ref 4.0–10.5)
nRBC: 0 % (ref 0.0–0.2)

## 2022-03-07 LAB — URINALYSIS, ROUTINE W REFLEX MICROSCOPIC
Bilirubin Urine: NEGATIVE
Glucose, UA: NEGATIVE mg/dL
Ketones, ur: NEGATIVE mg/dL
Leukocytes,Ua: NEGATIVE
Nitrite: NEGATIVE
Protein, ur: 30 mg/dL — AB
RBC / HPF: >50 RBC/hpf — ABNORMAL HIGH (ref 0–5)
Specific Gravity, Urine: 1.015 (ref 1.005–1.030)
pH: 5 (ref 5.0–8.0)

## 2022-03-07 LAB — COMPREHENSIVE METABOLIC PANEL
ALT: 62 U/L — ABNORMAL HIGH (ref 0–44)
AST: 37 U/L (ref 15–41)
Albumin: 2.7 g/dL — ABNORMAL LOW (ref 3.5–5.0)
Alkaline Phosphatase: 47 U/L (ref 38–126)
Anion gap: 10 (ref 5–15)
BUN: 39 mg/dL — ABNORMAL HIGH (ref 8–23)
CO2: 25 mmol/L (ref 22–32)
Calcium: 9.6 mg/dL (ref 8.9–10.3)
Chloride: 100 mmol/L (ref 98–111)
Creatinine, Ser: 1.61 mg/dL — ABNORMAL HIGH (ref 0.44–1.00)
GFR, Estimated: 32 mL/min — ABNORMAL LOW (ref >60)
Glucose, Bld: 125 mg/dL — ABNORMAL HIGH (ref 70–99)
Potassium: 4 mmol/L (ref 3.5–5.1)
Sodium: 135 mmol/L (ref 135–145)
Total Bilirubin: 1.6 mg/dL — ABNORMAL HIGH (ref 0.3–1.2)
Total Protein: 6.9 g/dL (ref 6.5–8.1)

## 2022-03-07 LAB — LACTIC ACID, PLASMA
Lactic Acid, Venous: 2.1 mmol/L (ref 0.5–1.9)
Lactic Acid, Venous: 1.5 mmol/L (ref 0.5–1.9)

## 2022-03-07 LAB — RESP PANEL BY RT-PCR (FLU A&B, COVID) ARPGX2
Influenza A by PCR: NEGATIVE
Influenza B by PCR: NEGATIVE
SARS Coronavirus 2 by RT PCR: NEGATIVE

## 2022-03-07 LAB — TROPONIN I (HIGH SENSITIVITY)
Troponin I (High Sensitivity): 86 ng/L — ABNORMAL HIGH (ref <18)
Troponin I (High Sensitivity): 74 ng/L — ABNORMAL HIGH (ref <18)

## 2022-03-07 LAB — MAGNESIUM: Magnesium: 1.5 mg/dL — ABNORMAL LOW (ref 1.7–2.4)

## 2022-03-07 LAB — T4, FREE: Free T4: 2.13 ng/dL — ABNORMAL HIGH (ref 0.61–1.12)

## 2022-03-07 LAB — PROTIME-INR
INR: 1.5 — ABNORMAL HIGH (ref 0.8–1.2)
Prothrombin Time: 18.2 seconds — ABNORMAL HIGH (ref 11.4–15.2)

## 2022-03-07 LAB — TSH: TSH: 2.389 u[IU]/mL (ref 0.350–4.500)

## 2022-03-07 LAB — AMMONIA: Ammonia: 16 umol/L (ref 9–35)

## 2022-03-07 LAB — BRAIN NATRIURETIC PEPTIDE: B Natriuretic Peptide: 212.5 pg/mL — ABNORMAL HIGH (ref 0.0–100.0)

## 2022-03-07 LAB — APTT: aPTT: 46 seconds — ABNORMAL HIGH (ref 24–36)

## 2022-03-07 MED ORDER — LEVOTHYROXINE SODIUM 75 MCG PO TABS: 150.0000 ug | ORAL_TABLET | ORAL | Status: DC

## 2022-03-07 MED ORDER — SODIUM CHLORIDE 0.9 % IV SOLN: INTRAVENOUS | Status: AC

## 2022-03-07 MED ORDER — LEVOTHYROXINE SODIUM 75 MCG PO TABS
225.0000 ug | ORAL_TABLET | ORAL | Status: DC
  Administered 2022-03-08: 225 ug via ORAL
  Filled 2022-03-07 (×2): qty 3

## 2022-03-07 MED ORDER — MAGNESIUM SULFATE 2 GM/50ML IV SOLN
2.0000 g | Freq: Once | INTRAVENOUS | Status: AC
  Administered 2022-03-07: 2 g via INTRAVENOUS
  Filled 2022-03-07: qty 50

## 2022-03-07 MED ORDER — FERROUS SULFATE 325 (65 FE) MG PO TABS
325.0000 mg | ORAL_TABLET | Freq: Every day | ORAL | Status: DC
  Administered 2022-03-08 – 2022-03-09 (×2): 325 mg via ORAL
  Filled 2022-03-07 (×2): qty 1

## 2022-03-07 MED ORDER — METOPROLOL TARTRATE 12.5 MG HALF TABLET
12.5000 mg | ORAL_TABLET | Freq: Two times a day (BID) | ORAL | Status: DC
  Administered 2022-03-07 – 2022-03-09 (×4): 12.5 mg via ORAL
  Filled 2022-03-07 (×4): qty 1

## 2022-03-07 MED ORDER — APIXABAN 2.5 MG PO TABS
2.5000 mg | ORAL_TABLET | Freq: Two times a day (BID) | ORAL | Status: DC
  Administered 2022-03-07 – 2022-03-09 (×4): 2.5 mg via ORAL
  Filled 2022-03-07 (×4): qty 1

## 2022-03-07 MED ORDER — VITAMIN D 25 MCG (1000 UNIT) PO TABS
2000.0000 [IU] | ORAL_TABLET | Freq: Every morning | ORAL | Status: DC
  Administered 2022-03-08: 2000 [IU] via ORAL
  Filled 2022-03-07: qty 2

## 2022-03-07 MED ORDER — LEVOTHYROXINE SODIUM 75 MCG PO TABS
150.0000 ug | ORAL_TABLET | ORAL | Status: DC
  Administered 2022-03-09: 150 ug via ORAL

## 2022-03-07 MED ORDER — SODIUM CHLORIDE 0.9% FLUSH
3.0000 mL | Freq: Two times a day (BID) | INTRAVENOUS | Status: DC
  Administered 2022-03-07 – 2022-03-08 (×2): 3 mL via INTRAVENOUS

## 2022-03-07 MED ORDER — ADULT MULTIVITAMIN W/MINERALS CH
1.0000 | ORAL_TABLET | Freq: Every morning | ORAL | Status: DC
  Administered 2022-03-08: 1 via ORAL
  Filled 2022-03-07: qty 1

## 2022-03-07 MED ORDER — ACETAMINOPHEN 650 MG RE SUPP: 650.0000 mg | Freq: Four times a day (QID) | RECTAL | Status: DC | PRN

## 2022-03-07 MED ORDER — PANTOPRAZOLE SODIUM 40 MG PO TBEC
40.0000 mg | DELAYED_RELEASE_TABLET | Freq: Every morning | ORAL | Status: DC
  Administered 2022-03-08: 40 mg via ORAL
  Filled 2022-03-07: qty 1

## 2022-03-07 MED ORDER — LACTATED RINGERS IV BOLUS (SEPSIS)
1000.0000 mL | Freq: Once | INTRAVENOUS | Status: AC
  Administered 2022-03-07: 1000 mL via INTRAVENOUS

## 2022-03-07 MED ORDER — SODIUM CHLORIDE 0.9 % IV SOLN: 250.0000 mL | INTRAVENOUS | Status: DC | PRN

## 2022-03-07 MED ORDER — SODIUM CHLORIDE 0.9% FLUSH: 3.0000 mL | INTRAVENOUS | Status: DC | PRN

## 2022-03-07 MED ORDER — MIRTAZAPINE 15 MG PO TABS
7.5000 mg | ORAL_TABLET | Freq: Every day | ORAL | Status: DC
  Administered 2022-03-07 – 2022-03-08 (×2): 7.5 mg via ORAL
  Filled 2022-03-07 (×3): qty 1

## 2022-03-07 MED ORDER — ACETAMINOPHEN 325 MG PO TABS
650.0000 mg | ORAL_TABLET | Freq: Four times a day (QID) | ORAL | Status: DC | PRN
  Administered 2022-03-08: 650 mg via ORAL
  Filled 2022-03-07 (×2): qty 2

## 2022-03-07 MED ORDER — DORZOLAMIDE HCL 2 % OP SOLN
1.0000 [drp] | Freq: Two times a day (BID) | OPHTHALMIC | Status: DC
  Administered 2022-03-07 – 2022-03-09 (×4): 1 [drp] via OPHTHALMIC
  Filled 2022-03-07: qty 10

## 2022-03-07 NOTE — H&P (Addendum)
?History and Physical  ? ? ?Patient: Lauren Henry [id number] DOB: 02-08-1941 ?DOA: 03/07/2022 ?DOS: the patient was seen and examined on 03/07/2022 ?PCP: Haywood Pao, MD  ?Patient coming from: Home - lives with her husband. Uses walker which is new that she started about 3 weeks ago.  ? ? ?Chief Complaint: weakness/decreased appetite/weight loss  ? ?HPI: Lauren Henry is a 81 y.o. female with medical history significant of  complete heart block status post pacemaker placement, paroxysmal atrial fibrillation, nonobstructive CAD, diastolic dysfunction per 2D echo done in 2019, hypothyroidism, chronic kidney disease stage III who was just discharged from the hospital on 02/23/22 for syncope in setting of UTI. She had orthostatic hypotension and bp meds discontinued/adjusted.  ? ?Her husband gives history. He states every time they have come into hospital it has been for weakness and decline. Over the past 3 weeks she has just stopped eating and will take a few bites and say that she is full.  He thinks she started to slow down her PO intake around Christmas. She was put on prednisone a week ago to increase her appetite and this helped some, but she is eating less than 600 calories/day. She has lost 30 pounds of weight since Christmas 2022. They deny any trauma or life changing event in December to trigger this.  ? ?She has a resting tremor that is new over the past 3-4 weeks, flat affect an shuffling gait. Excessive daytime sleepiness, speech is slow, thoughts seem off.  NO family history of PD  ? ?No recent fever/chills, no headaches, no chest pain or palpitations, no shortness of breath or cough, no stomach pain, no N/V/D, no dysuria and denies any other urinary symptoms. No leg swelling.  ? ? ? ?ER Course:  vitals: temp: 99.2, bp: 156/79, HR: 87, RR: 13, oxygen: 98%RA ?Pertinent labs: lactic acid: 2.1>1.5, wbc: 12.0, hgb: 11.2, bun: 39, creatinine: 1.61, alt: 62, inr: 1.5, bnp: 212, troponin 86>74,  ?CXR:  mildly increased interstitial opacities, possibly interstitial edema.  ?In ED: given 1L bolus.  ? ? ?Review of Systems: As mentioned in the history of present illness. All other systems reviewed and are negative. ?Past Medical History:  ?Diagnosis Date  ? Arthritis   ? Atrial fibrillation ([place])   ? Diarrhea   ? since gallbladder removed  ? Dysrhythmia   ? 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 History of blood transfusion as a child  ? no abnormal reaction noted  ? History of colon polyps   ? Hyperlipidemia   ? takes Pravastatin daily  ? Hypertension   ? takes Azor and Metoprolol daily  ? Hypothyroidism   ? takes Synthroid daily  ? Joint pain   ? Presence of permanent cardiac pacemaker   ? Urinary frequency   ? Urinary urgency   ? ?Past Surgical History:  ?Procedure Laterality Date  ? ABDOMINAL HYSTERECTOMY    ? cataract surgery Bilateral   ? CHOLECYSTECTOMY  1971  ? COLONOSCOPY    ? EYE SURGERY    ? INSERT / REPLACE / REMOVE PACEMAKER    ? Medtronic PPM 11/01/14 (Dr. Cristopher Peru)  ? JOINT REPLACEMENT Right   ? knee  ? LEFT HEART CATH AND CORONARY ANGIOGRAPHY N/A 11/01/2018  ? Procedure: LEFT HEART CATH AND CORONARY ANGIOGRAPHY;  Surgeon: Nelva Bush, MD;  Location: [place] [facility];  Service: Cardiovascular;  Laterality: N/A;  ? PERMANENT PACEMAKER INSERTION N/A 11/01/2014  ?  Procedure: PERMANENT PACEMAKER INSERTION;  Surgeon: Evans Lance, MD;  Location: [facility];  Service: Cardiovascular;  Laterality: N/A;  ? stomach stapled  1985  ? TOTAL KNEE ARTHROPLASTY Left 11/05/2015  ? Procedure: TOTAL KNEE ARTHROPLASTY;  Surgeon: Vickey Huger, MD;  Location: [facility];  Service: Orthopedics;  Laterality: Left;  ? ?Social History:  reports that she has never smoked. She has never used smokeless tobacco. She reports current alcohol use of about 1.0 standard drink per week. She reports that she does not use drugs. ? ?Allergies  ?Allergen Reactions  ? Methimazole Other (See  Comments)  ?  Elevated LFT's per [facility]   ? Olmesartan Other (See Comments)  ?  enteritis  ? Amlodipine Swelling and Rash  ? ? ?Family History  ?Problem Relation Age of Onset  ? Heart disease Father   ? Heart attack Father   ? Stroke Sister   ? ? ?Prior to Admission medications   ?Medication Sig Start Date End Date Taking? Authorizing Provider  ?apixaban (ELIQUIS) 2.5 MG TABS tablet Take 1 tablet (2.5 mg total) by mouth 2 (two) times daily. 02/18/22   Little Ishikawa, MD  ?cefdinir (OMNICEF) 300 MG capsule Take 1 capsule (300 mg total) by mouth daily. 02/23/22   Ghimire, Henreitta Leber, MD  ?dorzolamide (TRUSOPT) 2 % ophthalmic solution Place 1 drop into the left eye 2 (two) times daily. 11/25/21   [provider]  ?ferrous sulfate 325 (65 FE) MG tablet Take 325 mg by mouth daily with breakfast.    [provider]  ?ipratropium (ATROVENT) 0.03 % nasal spray Place 2 sprays into both nostrils 2 (two) times daily. 02/05/22   [provider]  ?levothyroxine (SYNTHROID, LEVOTHROID) 150 MCG tablet Take 150 mcg by mouth daily before breakfast.  10/15/17   [provider]  ?metoprolol tartrate (LOPRESSOR) 25 MG tablet Take 0.5 tablets (12.5 mg total) by mouth 2 (two) times daily. 02/23/22   Ghimire, Henreitta Leber, MD  ?mirtazapine (REMERON) 7.5 MG tablet Take 7.5 mg by mouth at bedtime.  11/03/18   [provider]  ?Multiple Vitamin (MULTIVITAMIN WITH MINERALS) TABS tablet Take 1 tablet by mouth daily.    [provider]  ?ondansetron (ZOFRAN) 4 MG tablet Take 4 mg by mouth every 8 (eight) hours as needed. 02/05/22   [provider]  ?pantoprazole (PROTONIX) 40 MG tablet Take 40 mg by mouth daily. 11/10/18   [provider]  ?rosuvastatin (CRESTOR) 40 MG tablet TAKE 1 TABLET BY MOUTH DAILY ?Patient taking differently: Take 40 mg by mouth daily. 02/10/22   Skeet Latch, MD  ?vitamin C (ASCORBIC ACID) 250 MG tablet Take 250 mg by mouth daily.     [provider]  ?VITAMIN D, CHOLECALCIFEROL, PO Take 1 tablet by mouth daily.    [provider]  ? ? ?Physical Exam: ?Vitals:  ? 03/07/22 1200 03/07/22 1230 03/07/22 1300 03/07/22 1330  ?BP: (!) 125/99 115/63 110/73 108/63  ?Pulse: 76 66 69 77  ?Resp:  (!) 23 (!) 22 (!) 22  ?Temp:      ?TempSrc:      ?SpO2: 100% 98% 96% 97%  ?Weight:      ?Height:      ? ?General:  Appears calm and comfortable and is in NAD ?Eyes:  PERRL, EOMI, normal lids, iris ?ENT:  grossly normal hearing, lips & tongue, mmm; appropriate dentition ?Neck:  no LAD, masses or thyromegaly; no carotid bruits ?Cardiovascular:  RRR, no m/r/g. No  LE edema.  ?Respiratory:   CTA bilaterally with no wheezes/rales/rhonchi.  Normal respiratory effort. ?Abdomen:  soft, NT, ND, NABS ?Back:   normal alignment, no CVAT ?Skin:  no rash or induration seen on limited exam ?Musculoskeletal:  globally weak. BLE: 3/5 and BUE: 3-4/5. good ROM, no bony abnormality.  ?Lower extremity:  No LE edema.  Limited foot exam with no ulcerations.  2+ distal pulses. ?Psychiatric:  flat speech and affect.  speech slow. AOx3 ?Neurologic:  CN 2-12 grossly intact, moves all extremities in coordinated fashion, but slow, sensation intact. +faint resting tremor and intentional tremor.  ? ? ?Radiological Exams on Admission: ?Independently reviewed - see discussion in A/P where applicable ? ?DG Chest Port 1 View ? ?Result Date: 03/07/2022 ?CLINICAL DATA:  Questionable sepsis - evaluate for abnormality EXAM: PORTABLE CHEST 1 VIEW COMPARISON:  Radiograph 02/20/2022 FINDINGS: Unchanged dual chamber pacemaker leads. Unchanged cardiomediastinal silhouette. There is no focal airspace consolidation. Mildly increased interstitial opacities. There is no large pleural effusion. No visible pneumothorax. There is no acute osseous abnormality. Thoracic spondylosis. IMPRESSION: Mildly increased interstitial opacities, possibly interstitial edema. No focal airspace consolidation.  Electronically Signed   By: Maurine Simmering M.D.   On: 03/07/2022 09:59   ? ?EKG: Independently reviewed.  NSR with rate 95, v paced LBBB; nonspecific ST changes with no evidence of acute ischemia ?? New LBBB ? ? ?Labs on Admis

## 2022-03-07 NOTE — Assessment & Plan Note (Signed)
In NSR, paced.  ?Continue eliquis and metoprolol  ?

## 2022-03-07 NOTE — ED Triage Notes (Signed)
Pt arrived POV from home c/o weakness x2 weeks and not being able to walk x2 weeks. Pt denies any pain. Pt denies any falls or injuries.  ?

## 2022-03-07 NOTE — ED Provider Notes (Signed)
?[facility] ?Provider Note ? ? ?CSN: [id number] ?Arrival date & time: 03/07/22  0901 ? ?  ? ?History ? ?Chief Complaint  ?Patient presents with  ? Weakness  ? ? ?Lauren Henry is a 81 y.o. female with history of complete heart block status post pacemaker placement, A-fib on Eliquis, presenting to the ED in the company of her husband with generalized weakness.  The patient was just discharged from the hospital on 02/23/22 after 3-day hospitalization for suspected UTI and acute kidney injury.  She was noted time to have orthostatic hypotension, was discontinued off of her Aldactone and clonidine and hydralazine, but continues on 12.5 mg metoprolol twice daily for high blood pressure.  She had an echocardiogram which showed an EF of 55 to 60% with grade 1 diastolic dysfunction.  Unfortunately urine culture was not sent to confirm her possible infection, but the patient reports that her dysuria resolved with antibiotics at that time.  She also had blood cultures which were ultimately no growth except for single bottle which grew propionibacterium acnes, felt to be likely contaminant. ? ?They present back to the ED with the husband reporting patient has not had excessive lethargy, they were advised to come back by her PCP.  He also reports that the patient had blood work done as an outpatient was told that her "liver enzymes were abnormal".  She denies any history of liver problems prior to this. ? ?Her husband states her PCP had her on prednisone for several days which gave her a "burst of energy" but when she finished these medications 2 days ago she has returned to having no energy, not being able to get out of bed, sleeping all day.  The patient reports that she does not want to eat or drink anything because she has no appetite.  They deny diarrhea. ? ?Of note, while in the hospital, the patient was consulted by cardiology, who interrogated her device, showing no significant  arrhythmias at the time, rare brief high atrial rates that lasted less than a minute. V-paced 95% OF THE TIME.  Orthostatic vital signs were profoundly positive. ? ?HPI ? ?  ? ?Home Medications ?Prior to Admission medications   ?Medication Sig Start Date End Date Taking? Authorizing Provider  ?apixaban (ELIQUIS) 2.5 MG TABS tablet Take 1 tablet (2.5 mg total) by mouth 2 (two) times daily. 02/18/22   Little Ishikawa, MD  ?cefdinir (OMNICEF) 300 MG capsule Take 1 capsule (300 mg total) by mouth daily. 02/23/22   Ghimire, Henreitta Leber, MD  ?dorzolamide (TRUSOPT) 2 % ophthalmic solution Place 1 drop into the left eye 2 (two) times daily. 11/25/21   [provider]  ?ferrous sulfate 325 (65 FE) MG tablet Take 325 mg by mouth daily with breakfast.    [provider]  ?ipratropium (ATROVENT) 0.03 % nasal spray Place 2 sprays into both nostrils 2 (two) times daily. 02/05/22   [provider]  ?levothyroxine (SYNTHROID, LEVOTHROID) 150 MCG tablet Take 150 mcg by mouth daily before breakfast.  10/15/17   [provider]  ?metoprolol tartrate (LOPRESSOR) 25 MG tablet Take 0.5 tablets (12.5 mg total) by mouth 2 (two) times daily. 02/23/22   Ghimire, Henreitta Leber, MD  ?mirtazapine (REMERON) 7.5 MG tablet Take 7.5 mg by mouth at bedtime.  11/03/18   [provider]  ?Multiple Vitamin (MULTIVITAMIN WITH MINERALS) TABS tablet Take 1 tablet by mouth daily.    [provider]  ?ondansetron (ZOFRAN) 4 MG  tablet Take 4 mg by mouth every 8 (eight) hours as needed. 02/05/22   [provider]  ?pantoprazole (PROTONIX) 40 MG tablet Take 40 mg by mouth daily. 11/10/18   [provider]  ?rosuvastatin (CRESTOR) 40 MG tablet TAKE 1 TABLET BY MOUTH DAILY ?Patient taking differently: Take 40 mg by mouth daily. 02/10/22   Skeet Latch, MD  ?vitamin C (ASCORBIC ACID) 250 MG tablet Take 250 mg by mouth daily.    [provider]  ?VITAMIN D, CHOLECALCIFEROL, PO Take 1  tablet by mouth daily.    [provider]  ?   ? ?Allergies    ?Olmesartan and Amlodipine   ? ?Review of Systems   ?Review of Systems ? ?Physical Exam ?Updated Vital Signs ?BP 115/63   Pulse 66   Temp 99.2 ?F (37.3 ?C) (Oral)   Resp (!) 23   Ht 5\' 4"  (1.626 m)   Wt 76.7 kg   SpO2 98%   BMI 29.01 kg/m?  ?Physical Exam ?Constitutional:   ?   Comments: Appears tired, fatigue  ?HENT:  ?   Head: Normocephalic and atraumatic.  ?Eyes:  ?   Conjunctiva/sclera: Conjunctivae normal.  ?   Pupils: Pupils are equal, round, and reactive to light.  ?Cardiovascular:  ?   Rate and Rhythm: Normal rate and regular rhythm.  ?Pulmonary:  ?   Effort: Pulmonary effort is normal. No respiratory distress.  ?Abdominal:  ?   General: There is no distension.  ?   Tenderness: There is no abdominal tenderness.  ?Skin: ?   General: Skin is warm and dry.  ?Neurological:  ?   General: No focal deficit present.  ?   Mental Status: She is alert. Mental status is at baseline.  ?Psychiatric:     ?   Mood and Affect: Mood normal.     ?   Behavior: Behavior normal.  ? ? ?ED Results / Procedures / Treatments   ?Labs ?(all labs ordered are listed, but only abnormal results are displayed) ?Labs Reviewed  ?LACTIC ACID, PLASMA - Abnormal; Notable for the following components:  ?    Result Value  ? Lactic Acid, Venous 2.1 (*)   ? All other components within normal limits  ?COMPREHENSIVE METABOLIC PANEL - Abnormal; Notable for the following components:  ? Glucose, Bld 125 (*)   ? BUN 39 (*)   ? Creatinine, Ser 1.61 (*)   ? Albumin 2.7 (*)   ? ALT 62 (*)   ? Total Bilirubin 1.6 (*)   ? GFR, Estimated 32 (*)   ? All other components within normal limits  ?CBC WITH DIFFERENTIAL/PLATELET - Abnormal; Notable for the following components:  ? WBC 12.0 (*)   ? RBC 3.84 (*)   ? Hemoglobin 11.2 (*)   ? HCT 34.4 (*)   ? Neutro Abs 9.5 (*)   ? Abs Immature Granulocytes 0.89 (*)   ? All other components within normal limits  ?PROTIME-INR - Abnormal; Notable  for the following components:  ? Prothrombin Time 18.2 (*)   ? INR 1.5 (*)   ? All other components within normal limits  ?APTT - Abnormal; Notable for the following components:  ? aPTT 46 (*)   ? All other components within normal limits  ?URINALYSIS, ROUTINE W REFLEX MICROSCOPIC - Abnormal; Notable for the following components:  ? APPearance HAZY (*)   ? Hgb urine dipstick LARGE (*)   ? Protein, ur 30 (*)   ? RBC / HPF >50 (*)   ?  Bacteria, UA RARE (*)   ? Non Squamous Epithelial 0-5 (*)   ? All other components within normal limits  ?BRAIN NATRIURETIC PEPTIDE - Abnormal; Notable for the following components:  ? B Natriuretic Peptide 212.5 (*)   ? All other components within normal limits  ?T4, FREE - Abnormal; Notable for the following components:  ? Free T4 2.13 (*)   ? All other components within normal limits  ?TROPONIN I (HIGH SENSITIVITY) - Abnormal; Notable for the following components:  ? Troponin I (High Sensitivity) 86 (*)   ? All other components within normal limits  ?TROPONIN I (HIGH SENSITIVITY) - Abnormal; Notable for the following components:  ? Troponin I (High Sensitivity) 74 (*)   ? All other components within normal limits  ?RESP PANEL BY RT-PCR (FLU A&B, COVID) [id number]  ?URINE CULTURE  ?CULTURE, BLOOD (SINGLE)  ?LACTIC ACID, PLASMA  ?AMMONIA  ?TSH  ?PATHOLOGIST SMEAR REVIEW  ? ? ?EKG ?EKG Interpretation ? ?Date/Time:  Friday March 07 2022 09:10:54 EDT ?Ventricular Rate:  95 ?PR Interval:  174 ?QRS Duration: 165 ?QT Interval:  428 ?QTC Calculation: 539 ?R Axis:   -68 ?Text Interpretation: V paced rhythm Atrial premature complexes Left bundle branch block Confirmed by Octaviano Glow [phone]) on 03/07/2022 9:33:00 AM ? ?Radiology ?DG Chest Port 1 View ? ?Result Date: 03/07/2022 ?CLINICAL DATA:  Questionable sepsis - evaluate for abnormality EXAM: PORTABLE CHEST 1 VIEW COMPARISON:  Radiograph 02/20/2022 FINDINGS: Unchanged dual chamber pacemaker leads. Unchanged cardiomediastinal silhouette. There is  no focal airspace consolidation. Mildly increased interstitial opacities. There is no large pleural effusion. No visible pneumothorax. There is no acute osseous abnormality. Thoracic spondylosis. IMPRESSION: Mildly i

## 2022-03-07 NOTE — Consult Note (Signed)
Neurology Consultation ? 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 Rogers Blocker ? ?CC: Generalized weakness, poor appetite ? ?History is obtained from: Patient, Patient's husband at bedside ? ?HPI: Lauren Henry is a 81 y.o. female with a medical history significant for paroxysmal atrial fibrillation, complete heart block s/p PPM, hyperlipidemia, nonobstructive CAD, chronic kidney disease stage III, and hypothyroidism who was recently discharged from the hospital 02/23/22 for orthostasis and syncope in the setting of UTI / AKI. During this hospitalization, patient's antihypertensive medications were adjusted based on the patient's orthostasis. She has been at home for approximately 2 weeks and since her discharge from the hospital, the patient has been required to use her walker for transportation and has continued to have generalized weakness and lethargy reported over the past 3 weeks. She states that she has had a poor appetite, early satiety, and generalized weakness since around Christmas of 2022. In addition, the patient states that she has had worsening tremor over the past few weeks and her husband has noted decreased facial expressiveness as well. Due to concern for atypical parkinsonism, neurology was consulted for further evaluation.  ? ?ROS: A complete ROS was performed and is negative except as noted in the HPI.  ? ?Past Medical History:  ?Diagnosis Date  ? Arthritis   ? Atrial fibrillation ([place])   ? Diarrhea   ? since gallbladder removed  ? Dysrhythmia   ? 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 History of blood transfusion as a child  ? no abnormal reaction noted  ? History of colon polyps   ? Hyperlipidemia   ? takes Pravastatin daily  ? Hypertension   ? takes Azor and Metoprolol daily  ? Hypothyroidism   ? takes Synthroid daily  ? Joint pain   ? Presence of permanent cardiac pacemaker   ? Urinary frequency   ? Urinary urgency    ? ?Past Surgical History:  ?Procedure Laterality Date  ? ABDOMINAL HYSTERECTOMY    ? cataract surgery Bilateral   ? CHOLECYSTECTOMY  1971  ? COLONOSCOPY    ? EYE SURGERY    ? INSERT / REPLACE / REMOVE PACEMAKER    ? Medtronic PPM 11/01/14 (Dr. Cristopher Peru)  ? JOINT REPLACEMENT Right   ? knee  ? LEFT HEART CATH AND CORONARY ANGIOGRAPHY N/A 11/01/2018  ? Procedure: LEFT HEART CATH AND CORONARY ANGIOGRAPHY;  Surgeon: Nelva Bush, MD;  Location: [facility];  Service: Cardiovascular;  Laterality: N/A;  ? PERMANENT PACEMAKER INSERTION N/A 11/01/2014  ? Procedure: PERMANENT PACEMAKER INSERTION;  Surgeon: Evans Lance, MD;  Location: [facility] C[facility];  Service: Cardiovascular;  Laterality: N/A;  ? stomach stapled  1985  ? TOTAL KNEE ARTHROPLASTY Left 11/05/2015  ? Procedure: TOTAL KNEE ARTHROPLASTY;  Surgeon: Vickey Huger, MD;  Location: [facility];  Service: Orthopedics;  Laterality: Left;  ? ?Family History  ?Problem Relation Age of Onset  ? Heart disease Father   ? Heart attack Father   ? Stroke Sister   ? ?Social History:  ? reports that she has never smoked. She has never used smokeless tobacco. She reports current alcohol use of about 1.0 standard drink per week. She reports that she does not use drugs. ? ?Medications ? ?Current Facility-Administered Medications:  ?  0.9 %  sodium chloride infusion, 250 mL, Intravenous, PRN, Orma Flaming, MD ?  acetaminophen (TYLENOL) tablet 650 mg, 650 mg, Oral, Q6H PRN **OR** acetaminophen (TYLENOL) suppository  650 mg, 650 mg, Rectal, Q6H PRN, Orma Flaming, MD ?  apixaban Arne Cleveland) tablet 2.5 mg, 2.5 mg, Oral, BID, Orma Flaming, MD ?  Derrill Memo ON 03/08/2022] cholecalciferol (VITAMIN D3) tablet 2,000 Units, 2,000 Units, Oral, q morning, Orma Flaming, MD ?  dorzolamide (TRUSOPT) 2 % ophthalmic solution 1 drop, 1 drop, Left Eye, BID, Orma Flaming, MD ?  Derrill Memo ON 03/08/2022] ferrous sulfate tablet 325 mg, 325 mg, Oral, Q breakfast, Orma Flaming, MD ?  Derrill Memo ON 03/09/2022]  levothyroxine (SYNTHROID) tablet 150 mcg, 150 mcg, Oral, Once per day on Sun Mon Wed Thu Fri, Orma Flaming, MD ?  Derrill Memo ON 03/08/2022] levothyroxine (SYNTHROID) tablet 225 mcg, 225 mcg, Oral, Once per day on Tue Sat, Wolfe, Allison, MD ?  metoprolol tartrate (LOPRESSOR) tablet 12.5 mg, 12.5 mg, Oral, BID, Orma Flaming, MD ?  mirtazapine (REMERON) tablet 7.5 mg, 7.5 mg, Oral, QHS, Orma Flaming, MD ?  Derrill Memo ON 03/08/2022] multivitamin with minerals tablet 1 tablet, 1 tablet, Oral, q morning, Orma Flaming, MD ?  Derrill Memo ON 03/08/2022] pantoprazole (PROTONIX) EC tablet 40 mg, 40 mg, Oral, q morning, Orma Flaming, MD ?  sodium chloride flush (NS) 0.9 % injection 3 mL, 3 mL, Intravenous, Q12H, Orma Flaming, MD ?  sodium chloride flush (NS) 0.9 % injection 3 mL, 3 mL, Intravenous, PRN, Orma Flaming, MD ? ?Current Outpatient Medications:  ?  apixaban (ELIQUIS) 2.5 MG TABS tablet, Take 1 tablet (2.5 mg total) by mouth 2 (two) times daily., Disp: 60 tablet, Rfl: 0 ?  Cholecalciferol (VITAMIN D3) 50 MCG (2000 UT) TABS, Take 2,000 Units by mouth every morning., Disp: , Rfl:  ?  dorzolamide (TRUSOPT) 2 % ophthalmic solution, Place 1 drop into the left eye 2 (two) times daily., Disp: , Rfl:  ?  ferrous sulfate 325 (65 FE) MG tablet, Take 325 mg by mouth daily with breakfast., Disp: , Rfl:  ?  ipratropium (ATROVENT) 0.03 % nasal spray, Place 2 sprays into both nostrils 2 (two) times daily as needed for rhinitis., Disp: , Rfl:  ?  levothyroxine (SYNTHROID, LEVOTHROID) 150 MCG tablet, Take 150-225 mcg by mouth See admin instructions. Take one tablet (150 mcg) by mouth daily before breakfast, on 2 days of the week take an additional 1/2 tablet (75 mg) for a total dose on those days of 225 mcg, Disp: , Rfl: 0 ?  metoprolol tartrate (LOPRESSOR) 25 MG tablet, Take 0.5 tablets (12.5 mg total) by mouth 2 (two) times daily., Disp: 60 tablet, Rfl: 3 ?  mirtazapine (REMERON) 7.5 MG tablet, Take 7.5 mg by mouth at bedtime. , Disp:  , Rfl:  ?  Multiple Vitamin (MULTIVITAMIN WITH MINERALS) TABS tablet, Take 1 tablet by mouth every morning., Disp: , Rfl:  ?  ondansetron (ZOFRAN) 4 MG tablet, Take 4 mg by mouth every 8 (eight) hours as needed for nausea or vomiting., Disp: , Rfl:  ?  pantoprazole (PROTONIX) 40 MG tablet, Take 40 mg by mouth every morning., Disp: , Rfl: 3 ?  vitamin C (ASCORBIC ACID) 250 MG tablet, Take 250 mg by mouth every morning., Disp: , Rfl:  ?  cefdinir (OMNICEF) 300 MG capsule, Take 1 capsule (300 mg total) by mouth daily. (Patient not taking: Reported on 03/07/2022), Disp: 2 capsule, Rfl: 0 ?  predniSONE (DELTASONE) 10 MG tablet, Take 40 mg by mouth daily. (Patient not taking: Reported on 03/07/2022), Disp: , Rfl:  ?  rosuvastatin (CRESTOR) 40 MG tablet, TAKE 1 TABLET BY MOUTH DAILY (Patient not taking: Reported  on 03/07/2022), Disp: 90 tablet, Rfl: 2 ? ?Exam: ?Current vital signs: ?BP 108/63   Pulse 77   Temp 99.2 ?F (37.3 ?C) (Oral)   Resp (!) 22   Ht 5\' 4"  (1.626 m)   Wt 76.7 kg   SpO2 97%   BMI 29.01 kg/m?  ?Vital signs in last 24 hours: ?Temp:  [99.2 ?F (37.3 ?C)] 99.2 ?F (37.3 ?C) (03/17 0910) ?Pulse Rate:  [66-93] 77 (03/17 1330) ?Resp:  [13-24] 22 (03/17 1330) ?BP: (100-156)/(58-99) 108/63 (03/17 1330) ?SpO2:  [96 %-100 %] 97 % (03/17 1330) ?Weight:  [76.7 kg] 76.7 kg (03/17 0908) ? ?GENERAL: Awake, alert, in no acute distress ?Psych: Affect appropriate for situation, patient is calm and cooperative with examination ?Head: Normocephalic and atraumatic, without obvious abnormality ?EENT: Normal conjunctivae, dry mucous membranes, no OP obstruction ?LUNGS: Normal respiratory effort. Non-labored breathing on room air ?CV: Regular rate and rhythm on telemetry ?ABDOMEN: Soft, non-tender, non-distended ?Extremities: Warm, well perfused, without obvious deformity ? ?NEURO:  ?Mental Status: Drowsy but wakes easily to voice, alert, and oriented to person, place, time, and situation. ?She is able to provide a clear and  coherent history of present illness. ?Speech/Language: speech is fluent without evidence of dysarthria or aphasia.    ?No neglect is noted ?Cranial Nerves:  ?II: PERRL. Visual fields full.  ?III, IV, VI: EOMI wit

## 2022-03-07 NOTE — Assessment & Plan Note (Signed)
81 year old female presenting with 3 month history of overall decline, but more progressive over the last 3 week with poor PO intake, 30 pound weight loss, weakness and inability to complete ADLs ?-admit to telemetry ?-concern for neurological process, multiple features consistent with PD. Neurology consulted  ?-nutrition/PT/OT and SW consult as she will need rehab/SNF ?-labs relatively unimpressive, has microscopic hematuria, so will check culture.  ?-leukopenia in the past, peripheral smear pending.  ?-new LBBB and mildly elevated troponin>cards consulted  ?

## 2022-03-07 NOTE — Assessment & Plan Note (Signed)
Device interrogated at last hospital stay a week ago with no events ?Continue telemetry  ?

## 2022-03-07 NOTE — TOC Progression Note (Signed)
Transition of Care (TOC) - Initial/Assessment Note  ? ? ?Patient Details  ?Name: Lauren Henry ?MRN: [id number] ?Date of Birth: 21-Mar-1941 ? ?Transition of Care (TOC) CM/SW Contact:    ?Paulene Floor Bartlett Enke, LCSWA ?Phone Number: ?03/07/2022, 3:23 PM ? ?Clinical Narrative:                 ?TOC following patient for any d/c planning needs once medically stable. ? ?CSW received and acknowledged SNF consult.  PT/OT assessments and recommendations are pending and will be needed prior to initiating the SNF process. ? ?Lind Covert, MSW, LCSWA  ? ?  ?  ? ? ?Patient Goals and CMS Choice ?  ?  ?  ? ?Expected Discharge Plan and Services ?  ?  ?  ?  ?  ?                ?  ?  ?  ?  ?  ?  ?  ?  ?  ?  ? ?Prior Living Arrangements/Services ?  ?  ?  ?       ?  ?  ?  ?  ? ?Activities of Daily Living ?  ?  ? ?Permission Sought/Granted ?  ?  ?   ?   ?   ?   ? ?Emotional Assessment ?  ?  ?  ?  ?  ?  ? ?Admission diagnosis:  Weakness [R53.1] ?Failure to thrive in adult [R62.7] ?Patient Active Problem List  ? Diagnosis Date Noted  ? Failure to thrive in adult 03/07/2022  ? 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 Microscopic hematuria 03/07/2022  ? Stage 3b chronic kidney disease (CKD) ([place]) 02/21/2022  ? Syncope and collapse 02/20/2022  ? UTI (urinary tract infection) 02/20/2022  ? Syncope 02/20/2022  ? Postural dizziness with presyncope 02/18/2022  ? Weakness 02/17/2022  ? Orthostatic hypotension 02/17/2022  ? Glaucoma 02/17/2022  ? Gastroenteropathy 08/28/2020  ? AKI (acute kidney injury) ([place]) 08/27/2020  ? Nausea vomiting and diarrhea 08/23/2020  ? Chronic anticoagulation 11/24/2018  ? CAD in native artery 11/24/2018  ? Chronic renal disease, stage III ([place]) 11/24/2018  ? Anemia 11/24/2018  ? Elevated troponin with new LBBB   ? Urinary urgency   ? Urinary frequency   ? Presence of permanent cardiac pacemaker   ? Joint pain   ? History of colon polyps   ? History of blood transfusion   ? Diarrhea   ? Arthritis   ? 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 Preop cardiovascular exam 10/18/2015  ? Chest pain 11/09/2014  ? PAF (paroxysmal atrial fibrillation) ([place]) 11/09/2014  ? Essential hypertension 10/30/2014  ? Hyperlipidemia 10/30/2014  ? Hypothyroidism 10/30/2014  ? CHB (complete heart block) ([place]) s/p PPM 10/30/2014  ? 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 Right-sided low back pain with right-sided sciatica 09/14/2014  ? Knee pain 01/31/2013  ? Back pain 08/27/2012  ? Right groin pain 01/13/2012  ? Anxiety disorder 09/23/2009  ? ?PCP:  Haywood Pao, MD ?Pharmacy:   ?[facility], [address] ?[place] [address] ?Phone: [phone] Fax: [phone] ? ?[facility] [phone] - [place], [address] ?[place] [address] ?Phone: [phone] Fax: [phone] ? ? ? ? ?Social Determinants of Health (SDOH) Interventions ?  ? ?Readmission Risk Interventions ?No flowsheet data found. ? ? ?

## 2022-03-07 NOTE — Assessment & Plan Note (Signed)
Well controlled continue lopressor  ?

## 2022-03-07 NOTE — Assessment & Plan Note (Signed)
Creatinine baseline: 1.5-1.6 ?Stable, continue to monitor  ?

## 2022-03-07 NOTE — Assessment & Plan Note (Signed)
LHC in 2019 showed mild to moderate non obstructive CAD ?Continue crestor/metoprolol and eliquis  ?

## 2022-03-07 NOTE — Consult Note (Addendum)
?Cardiology Consultation:  ? ?Patient ID: Lauren Henry ?MRN: [id number]; DOB: 05-May-1941 ? ?Admit date: 03/07/2022 ?Date of Consult: 03/07/2022 ? ?PCP:  Gaspar Garbe, MD ?  ?[facility] Providers ?Cardiologist:  Chilton Si, MD  ?Electrophysiologist:  Lewayne Bunting, MD     ? ? ?Patient Profile:  ? ?Lauren Henry is a 81 y.o. female with a hx of left bundle branch block, complete heart block status post permanent pacemaker in 2015, nonobstructive coronary artery disease, paroxysmal atrial fibrillation on chronic anticoagulation, hypertension, CKD stage IIIb, hyperthyroidism status post thyroidectomy on thyroid hormone replacement who is being seen 03/07/2022 for the evaluation of possible new left bundle branch block and elevated troponins at the request of Dr Orland Mustard. ? ?History of Present Illness:  ? ?Lauren Henry and her spouse at bedside provide history.  Patient has had multiple hospitalizations over the last month to month and a half.  She most recently was brought to the emergency room when her home pulse oximeter was persistently in the 80s and she experienced a syncopal episode.  This was thought to be due to orthostatic hypotension in the setting of multiple antihypertensives and decreased oral intake. She was also treated for a UTI at that time. Since discharge 3/5 patient has had no further episodes of syncope.  Her primary care physician placed her on a short course of prednisone in efforts to stimulate her appetite.  This reportedly did help her appetite as per the calorie count from her family, she was eating about 1600 cal a day during this time.  She had follow-up with her primary care physician the day prior to admission and her labs at that time were notable for significantly elevated LFTs and it was recommended that patient present to the hospital for further work-up. I t was also recommended that she present to the hospital as she has had a significant decline in her overall  mobility since 11/2021 and in her appetite. She has lost ~30lbs since that time.  ? ?Cardiology was consulted given her mildly elevated troponins and LBBB noted on her admission EKG to see if this was potentially contributing to her decline.  ? ? ?Patient denies headache, syncope, changes in vision, dysphagia or odynophagia, chest pain, shortness of breath, dyspnea on exertion, dysuria, constipation or diarrhea. ? ? ?Past Medical History:  ?Diagnosis Date  ? Arthritis   ? Atrial fibrillation (HCC)   ? Diarrhea   ? since gallbladder removed  ? Dysrhythmia   ? 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 History of blood transfusion as a child  ? no abnormal reaction noted  ? History of colon polyps   ? Hyperlipidemia   ? takes Pravastatin daily  ? Hypertension   ? takes Azor and Metoprolol daily  ? Hypothyroidism   ? takes Synthroid daily  ? Joint pain   ? Presence of permanent cardiac pacemaker   ? Urinary frequency   ? Urinary urgency   ? ? ?Past Surgical History:  ?Procedure Laterality Date  ? ABDOMINAL HYSTERECTOMY    ? cataract surgery Bilateral   ? CHOLECYSTECTOMY  1971  ? COLONOSCOPY    ? EYE SURGERY    ? INSERT / REPLACE / REMOVE PACEMAKER    ? Medtronic PPM 11/01/14 (Dr. Lewayne Bunting)  ? JOINT REPLACEMENT Right   ? knee  ? LEFT HEART CATH AND CORONARY ANGIOGRAPHY N/A 11/01/2018  ? Procedure: LEFT HEART CATH AND CORONARY ANGIOGRAPHY;  Surgeon: Nelva Bush, MD;  Location: [facility];  Service: Cardiovascular;  Laterality: N/A;  ? PERMANENT PACEMAKER INSERTION N/A 11/01/2014  ? Procedure: PERMANENT PACEMAKER INSERTION;  Surgeon: Evans Lance, MD;  Location: [facility];  Service: Cardiovascular;  Laterality: N/A;  ? stomach stapled  1985  ? TOTAL KNEE ARTHROPLASTY Left 11/05/2015  ? Procedure: TOTAL KNEE ARTHROPLASTY;  Surgeon: Vickey Huger, MD;  Location: [facility];  Service: Orthopedics;  Laterality: Left;  ?  ? ?Home Medications:  ?Prior to Admission medications    ?Medication Sig Start Date End Date Taking? Authorizing Provider  ?apixaban (ELIQUIS) 2.5 MG TABS tablet Take 1 tablet (2.5 mg total) by mouth 2 (two) times daily. 02/18/22  Yes Little Ishikawa, MD  ?Cholecalciferol (VITAMIN D3) 50 MCG (2000 UT) TABS Take 2,000 Units by mouth every morning.   Yes [provider]  ?dorzolamide (TRUSOPT) 2 % ophthalmic solution Place 1 drop into the left eye 2 (two) times daily. 11/25/21  Yes [provider]  ?ferrous sulfate 325 (65 FE) MG tablet Take 325 mg by mouth daily with breakfast.   Yes [provider]  ?ipratropium (ATROVENT) 0.03 % nasal spray Place 2 sprays into both nostrils 2 (two) times daily as needed for rhinitis. 02/05/22  Yes [provider]  ?levothyroxine (SYNTHROID, LEVOTHROID) 150 MCG tablet Take 150-225 mcg by mouth See admin instructions. Take one tablet (150 mcg) by mouth daily before breakfast, on 2 days of the week take an additional 1/2 tablet (75 mg) for a total dose on those days of 225 mcg 10/15/17  Yes [provider]  ?metoprolol tartrate (LOPRESSOR) 25 MG tablet Take 0.5 tablets (12.5 mg total) by mouth 2 (two) times daily. 02/23/22  Yes Ghimire, Henreitta Leber, MD  ?mirtazapine (REMERON) 7.5 MG tablet Take 7.5 mg by mouth at bedtime.  11/03/18  Yes [provider]  ?Multiple Vitamin (MULTIVITAMIN WITH MINERALS) TABS tablet Take 1 tablet by mouth every morning.   Yes [provider]  ?ondansetron (ZOFRAN) 4 MG tablet Take 4 mg by mouth every 8 (eight) hours as needed for nausea or vomiting. 02/05/22  Yes [provider]  ?pantoprazole (PROTONIX) 40 MG tablet Take 40 mg by mouth every morning. 11/10/18  Yes [provider]  ?vitamin C (ASCORBIC ACID) 250 MG tablet Take 250 mg by mouth every morning.   Yes [provider]  ?cefdinir (OMNICEF) 300 MG capsule Take 1 capsule (300 mg total) by mouth daily. ?Patient not taking: Reported on 03/07/2022 02/23/22   Jonetta Osgood, MD  ?predniSONE (DELTASONE) 10 MG tablet Take 40 mg by mouth daily. ?Patient not taking: Reported on 03/07/2022 02/27/22   [provider]  ?rosuvastatin (CRESTOR) 40 MG tablet TAKE 1 TABLET BY MOUTH DAILY ?Patient not taking: Reported on 03/07/2022 02/10/22   Skeet Latch, MD  ? ?Patient discontinued her Crestor due to elevated transaminases. ?Inpatient Medications: ?Scheduled Meds: ? apixaban  2.5 mg Oral BID  ? [START ON 03/08/2022] cholecalciferol  2,000 Units Oral q morning  ? dorzolamide  1 drop Left Eye BID  ? [START ON 03/08/2022] ferrous sulfate  325 mg Oral Q breakfast  ? [START ON 03/09/2022] levothyroxine  150 mcg Oral Once per day on Sun Mon Wed Thu Fri  ? [START ON 03/08/2022] levothyroxine  225 mcg Oral Once per day on Tue Sat  ? metoprolol tartrate  12.5 mg Oral BID  ? mirtazapine  7.5 mg Oral QHS  ? [START ON  03/08/2022] multivitamin with minerals  1 tablet Oral q morning  ? [START ON 03/08/2022] pantoprazole  40 mg Oral q morning  ? sodium chloride flush  3 mL Intravenous Q12H  ? ?Continuous Infusions: ? sodium chloride    ? ?PRN Meds: ?sodium chloride, acetaminophen **OR** acetaminophen, sodium chloride flush ? ?Allergies:    ?Allergies  ?Allergen Reactions  ? Methimazole Other (See Comments)  ?  Elevated LFT's per [facility]   ? Olmesartan Other (See Comments)  ?  enteritis  ? Amlodipine Swelling and Rash  ? ? ?Social History:   ?Social History  ? ?Socioeconomic History  ? Marital status: Married  ?  Spouse name: Not on file  ? Number of children: Not on file  ? Years of education: Not on file  ? Highest education level: Not on file  ?Occupational History  ? Not on file  ?Tobacco Use  ? Smoking status: Never  ? Smokeless tobacco: Never  ?Vaping Use  ? Vaping Use: Never used  ?Substance and Sexual Activity  ? Alcohol use: Yes  ?  Alcohol/week: 1.0 standard drink  ?  Types: 1 Glasses of wine per week  ? Drug use: No  ? Sexual activity: Not Currently  ?  Birth control/protection:  Surgical  ?Other Topics Concern  ? Not on file  ?Social History Narrative  ? Not on file  ? ?Social Determinants of Health  ? ?Financial Resource Strain: Not on file  ?Food Insecurity: Not on file  ?[employer]

## 2022-03-07 NOTE — Assessment & Plan Note (Signed)
Continue crestor 

## 2022-03-07 NOTE — Assessment & Plan Note (Addendum)
TSH wnl, but free T4 is elevated.  ?PCP may need to mildly decrease dose of synthroid and repeat labs  ? ?

## 2022-03-07 NOTE — ED Notes (Signed)
Got patient changed another brief on placed a external cath got patient some warm blankets patient is resting with fsmily at bedside and call bell in reach  ?

## 2022-03-07 NOTE — Assessment & Plan Note (Addendum)
Recently treated for UTI, asymptomatic ?Repeat UA today shows >50 RBC ?Culture pending ?Further work up outpatient if culture negative and repeat UA with hematuria   ?

## 2022-03-07 NOTE — Assessment & Plan Note (Signed)
-  she has flat affect, resting tremor, excessive daytime sleepiness, shuffling gait, some confused thoughts and poor PO intake, multiple PD features ?-have asked neurology to see  ?

## 2022-03-07 NOTE — Assessment & Plan Note (Addendum)
She has what appears to be a new LBBB compared to old ekg and mildly elevated troponin 86>74 ?She has no chest pain ?Telemetry  ?Will have cardiology see to make sure not contributing to her weakness/fatigue/FTT ?Known mild to moderate non obstructive CAD from [facility] in 10/2018  ?Echo 02/22/22: EF of 55-60% normal LVF. Grade I DD ? ?

## 2022-03-08 DIAGNOSIS — R531 Weakness: Secondary | ICD-10-CM

## 2022-03-08 DIAGNOSIS — R627 Adult failure to thrive: Secondary | ICD-10-CM | POA: Diagnosis not present

## 2022-03-08 LAB — COMPREHENSIVE METABOLIC PANEL
ALT: 48 U/L — ABNORMAL HIGH (ref 0–44)
AST: 28 U/L (ref 15–41)
Albumin: 2.2 g/dL — ABNORMAL LOW (ref 3.5–5.0)
Alkaline Phosphatase: 47 U/L (ref 38–126)
Anion gap: 10 (ref 5–15)
BUN: 29 mg/dL — ABNORMAL HIGH (ref 8–23)
CO2: 22 mmol/L (ref 22–32)
Calcium: 9.2 mg/dL (ref 8.9–10.3)
Chloride: 104 mmol/L (ref 98–111)
Creatinine, Ser: 1.39 mg/dL — ABNORMAL HIGH (ref 0.44–1.00)
GFR, Estimated: 38 mL/min — ABNORMAL LOW (ref >60)
Glucose, Bld: 106 mg/dL — ABNORMAL HIGH (ref 70–99)
Potassium: 3.6 mmol/L (ref 3.5–5.1)
Sodium: 136 mmol/L (ref 135–145)
Total Bilirubin: 1.2 mg/dL (ref 0.3–1.2)
Total Protein: 5.8 g/dL — ABNORMAL LOW (ref 6.5–8.1)

## 2022-03-08 LAB — VITAMIN B12: Vitamin B-12: 369 pg/mL (ref 180–914)

## 2022-03-08 LAB — CBC
HCT: 29.2 % — ABNORMAL LOW (ref 36.0–46.0)
Hemoglobin: 9.3 g/dL — ABNORMAL LOW (ref 12.0–15.0)
MCH: 28.7 pg (ref 26.0–34.0)
MCHC: 31.8 g/dL (ref 30.0–36.0)
MCV: 90.1 fL (ref 80.0–100.0)
Platelets: 138 10*3/uL — ABNORMAL LOW (ref 150–400)
RBC: 3.24 MIL/uL — ABNORMAL LOW (ref 3.87–5.11)
RDW: 15.2 % (ref 11.5–15.5)
WBC: 9.3 10*3/uL (ref 4.0–10.5)
nRBC: 0 % (ref 0.0–0.2)

## 2022-03-08 LAB — URINE CULTURE

## 2022-03-08 LAB — MAGNESIUM: Magnesium: 2 mg/dL (ref 1.7–2.4)

## 2022-03-08 MED ORDER — THIAMINE HCL 100 MG PO TABS
100.0000 mg | ORAL_TABLET | Freq: Every day | ORAL | Status: DC
Start: 2022-03-08 — End: 2022-03-09
  Administered 2022-03-08 – 2022-03-09 (×2): 100 mg via ORAL
  Filled 2022-03-08 (×2): qty 1

## 2022-03-08 MED ORDER — THIAMINE HCL 100 MG/ML IJ SOLN: 100.0000 mg | Freq: Every day | INTRAMUSCULAR | Status: DC

## 2022-03-08 NOTE — Progress Notes (Signed)
?PROGRESS NOTE ? ? ? ?Lauren Henry  [id number] DOB: 1941/08/28 DOA: 03/07/2022 ?PCP: Haywood Pao, MD ? ? ?Brief Narrative:  ?HPI: Lauren Henry is a 81 y.o. female with medical history significant of  complete heart block status post pacemaker placement, paroxysmal atrial fibrillation, nonobstructive CAD, diastolic dysfunction per 2D echo done in 2019, hypothyroidism, chronic kidney disease stage III who was just discharged from the hospital on 02/23/22 for syncope in setting of UTI. She had orthostatic hypotension and bp meds discontinued/adjusted.  ?  ?Her husband gives history. He states every time they have come into hospital it has been for weakness and decline. Over the past 3 weeks she has just stopped eating and will take a few bites and say that she is full.  He thinks she started to slow down her PO intake around Christmas. She was put on prednisone a week ago to increase her appetite and this helped some, but she is eating less than 600 calories/day. She has lost 30 pounds of weight since Christmas 2022. They deny any trauma or life changing event in December to trigger this.  ?  ?She has a resting tremor that is new over the past 3-4 weeks, flat affect an shuffling gait. Excessive daytime sleepiness, speech is slow, thoughts seem off.  NO family history of PD  ?  ?No recent fever/chills, no headaches, no chest pain or palpitations, no shortness of breath or cough, no stomach pain, no N/V/D, no dysuria and denies any other urinary symptoms. No leg swelling.  ?  ?ER Course:  vitals: temp: 99.2, bp: 156/79, HR: 87, RR: 13, oxygen: 98%RA ?Pertinent labs: lactic acid: 2.1>1.5, wbc: 12.0, hgb: 11.2, bun: 39, creatinine: 1.61, alt: 62, inr: 1.5, bnp: 212, troponin 86>74,  ?CXR: mildly increased interstitial opacities, possibly interstitial edema.  ?In ED: given 1L bolus.  ? ?Assessment & Plan: ?  ?Principal Problem: ?  Failure to thrive in adult ?Active Problems: ?  Parkinsonian features ?  Elevated  troponin with new LBBB ?  Microscopic hematuria ?  Hypothyroidism ?  CAD in native artery ?  PAF (paroxysmal atrial fibrillation) ([place]) ?  Stage 3b chronic kidney disease (CKD) ([place]) ?  Hyperlipidemia ?  Essential hypertension ?  CHB (complete heart block) (HCC) s/p PPM ? ?Failure to thrive in adult: She has been having poor appetite and poor p.o. intake since Christmas.  When discussed, she cannot come up with any reasons but says that she does not like the food.  She is fully alert and oriented.  She was encouraged to eat more.  Dietitian is consulted. ?  ?Parkinsonian features/resting tremors: She was presumed to have parkinsonian features and for that neurology was consulted but according to them, she does not have Parkinson symptoms and they obtained CT cervical spine which was negative as well.  MRI is contraindicated due to PPM.  Other labs are pending.  Appreciate neurology help. ?  ?Elevated troponin with new LBBB: She has very slight elevation of troponin which are flat and she has no acute ST-T wave changes on the EKG.  Cardiology was consulted however they signed off and recommended continue current management. ?Echo 02/22/22: EF of 55-60% normal LVF. Grade I DD ?  ?Microscopic hematuria ?Recently treated for UTI, asymptomatic ?Repeat UA today shows >50 RBC ?Culture pending ?Further work up outpatient if culture negative and repeat UA with hematuria   ?  ?Hypothyroidism ?TSH wnl, but free T4 is elevated.  Continue Synthroid. ? ?CAD in  native artery ?LHC in 2019 showed mild to moderate non obstructive CAD ?Continue crestor/metoprolol and eliquis  ?  ?PAF (paroxysmal atrial fibrillation) ([place]) ?In NSR, paced.  ?Continue eliquis and metoprolol  ?  ?Stage 3b chronic kidney disease (CKD) ([place]): At baseline ?  ?Hyperlipidemia ?Continue crestor  ?  ?Essential hypertension ?Well controlled continue lopressor  ?  ?CHB (complete heart block) (HCC) s/p PPM ?Device interrogated at last hospital stay a week ago with no  events ?Continue telemetry  ? ?Generalized weakness with weight loss: She appears to be very weak physically, PT OT consulted, I suspect that she will require SNF placement. ?  ? ?DVT prophylaxis: apixaban (ELIQUIS) tablet 2.5 mg Start: 03/07/22 2200 ?Place TED hose Start: 03/07/22 1451 ?  Code Status: Full Code  ?Family Communication: Her best friend was present at bedside.  Plan of care discussed with patient in length and he/she verbalized understanding and agreed with it. ? ?Status is: Observation ?The patient will require care spanning > 2 midnights and should be moved to inpatient because: Patient needs to be seen by PT OT and will most likely require SNF discharge. ? ? ?Estimated body mass index is 29.14 kg/m? as calculated from the following: ?  Height as of this encounter: 5\' 4"  (1.626 m). ?  Weight as of this encounter: 77 kg. ? ?  ?Nutritional Assessment: ?Body mass index is 29.14 kg/m?Marland KitchenMarland Kitchen ?Seen by dietician.  I agree with the assessment and plan as outlined below: ?Nutrition Status: ?  ?  ?  ? ?. ?Skin Assessment: ?I have examined the patient's skin and I agree with the wound assessment as performed by the wound care RN as outlined below: ?  ? ?Consultants:  ?Cardiology-signed off ?Neurology ? ?Procedures:  ?None ? ?Antimicrobials:  ?Anti-infectives (From admission, onward)  ? ? None  ? ?  ?  ? ? ?Subjective: ?Seen and examined.  She has no specific complaint.  She was fully alert and oriented.  She said that she just does not like the food anymore. ? ?Objective: ?Vitals:  ? 03/07/22 2254 03/08/22 0500 03/08/22 0508 03/08/22 0902  ?BP: 105/81  (!) 114/56 (!) 114/54  ?Pulse: 85  79 83  ?Resp: 18  16 18   ?Temp: 98 ?F (36.7 ?C)  97.7 ?F (36.5 ?C) 98.3 ?F (36.8 ?C)  ?TempSrc: Oral   Oral  ?SpO2: 95%  95% 95%  ?Weight:  77 kg    ?Height:      ? ? ?Intake/Output Summary (Last 24 hours) at 03/08/2022 1106 ?Last data filed at 03/07/2022 Bosie Helper ?Gross per 24 hour  ?Intake 1177 ml  ?Output --  ?Net 1177 ml  ? ?Filed  Weights  ? 03/07/22 0908 03/07/22 1525 03/08/22 0500  ?Weight: 76.7 kg 75.8 kg 77 kg  ? ? ?Examination: ? ?General exam: Appears calm and comfortable but cachectic ?Respiratory system: Clear to auscultation. Respiratory effort normal. ?Cardiovascular system: S1 & S2 heard, RRR. No JVD, murmurs, rubs, gallops or clicks. No pedal edema. ?Gastrointestinal system: Abdomen is nondistended, soft and nontender. No organomegaly or masses felt. Normal bowel sounds heard. ?Central nervous system: Alert and oriented. No focal neurological deficits. ?Extremities: Symmetric 5 x 5 power. ?Skin: No rashes, lesions or ulcers ?Psychiatry: Judgement and insight appear normal. Mood & affect appropriate.  ? ? ?Data Reviewed: I have personally reviewed following labs and imaging studies ? ?CBC: ?Recent Labs  ?Lab 03/07/22 ?1009 03/08/22 ?0720  ?WBC 12.0* 9.3  ?NEUTROABS 9.5*  --   ?HGB 11.2* 9.3*  ?  HCT 34.4* 29.2*  ?MCV 89.6 90.1  ?PLT 188 138*  ? ?Basic Metabolic Panel: ?Recent Labs  ?Lab 03/07/22 ?1009 03/07/22 ?1543 03/08/22 ?0720  ?NA 135  --  136  ?K 4.0  --  3.6  ?CL 100  --  104  ?CO2 25  --  22  ?GLUCOSE 125*  --  106*  ?BUN 39*  --  29*  ?CREATININE 1.61*  --  1.39*  ?CALCIUM 9.6  --  9.2  ?MG  --  1.5* 2.0  ? ?GFR: ?Estimated Creatinine Clearance: 32.4 mL/min (A) (by C-G formula based on SCr of 1.39 mg/dL (H)). ?Liver Function Tests: ?Recent Labs  ?Lab 03/07/22 ?1009 03/08/22 ?0720  ?AST 37 28  ?ALT 62* 48*  ?ALKPHOS 47 47  ?BILITOT 1.6* 1.2  ?PROT 6.9 5.8*  ?ALBUMIN 2.7* 2.2*  ? ?No results for input(s): LIPASE, AMYLASE in the last 168 hours. ?Recent Labs  ?Lab 03/07/22 ?1009  ?AMMONIA 16  ? ?Coagulation Profile: ?Recent Labs  ?Lab 03/07/22 ?1009  ?INR 1.5*  ? ?Cardiac Enzymes: ?No results for input(s): CKTOTAL, CKMB, CKMBINDEX, TROPONINI in the last 168 hours. ?BNP (last 3 results) ?No results for input(s): PROBNP in the last 8760 hours. ?HbA1C: ?No results for input(s): HGBA1C in the last 72 hours. ?CBG: ?No results for  input(s): GLUCAP in the last 168 hours. ?Lipid Profile: ?No results for input(s): CHOL, HDL, LDLCALC, TRIG, CHOLHDL, LDLDIRECT in the last 72 hours. ?Thyroid Function Tests: ?Recent Labs  ?  03/07/22 ?1009

## 2022-03-08 NOTE — Progress Notes (Signed)
[facility] hospitalist informed via chat that patient stated she hasn't had any sleep at night. Arthor Captain LPN ?

## 2022-03-08 NOTE — Plan of Care (Signed)

## 2022-03-08 NOTE — Evaluation (Signed)
Physical Therapy Evaluation ?Patient Details ?Name: Lauren Henry ?MRN: [id number] ?DOB: 1941-10-10 ?Today's Date: 03/08/2022 ? ?History of Present Illness ? 81 y/o female presented to ED on 03/07/22 for weakness x 2 weeks and not being able to walk x 2 weeks. Concerned for failure to thrive in adult. PMHx:  low back pain, complete heart block status post pacemaker, atrial fibrillation, hypothyroidism, hyperlipidemia, HTN, gastroenteropathy, CKD 3, CAD, anemia  ?Clinical Impression ? Patient admitted with the above. Patient presents with generalized weakness, decreased activity tolerance, and impaired balance. Patient required minA bed mobility and modA+2 for sit to stand transfer. Able to take sidesteps towards [facility] with minA. Patient was incontinent of urine upon standing and able to stand x 5 minutes for linen change. Patient will benefit from skilled PT services during acute stay to address listed deficits. Recommend HHPT at discharge to maximize functional mobility and safety.    ?   ? ?Recommendations for follow up therapy are one component of a multi-disciplinary discharge planning process, led by the attending physician.  Recommendations may be updated based on patient status, additional functional criteria and insurance authorization. ? ?Follow Up Recommendations Home health PT ? ?  ?Assistance Recommended at Discharge Intermittent Supervision/Assistance  ?Patient can return home with the following ? A little help with walking and/or transfers;A little help with bathing/dressing/bathroom;Assistance with cooking/housework;Assist for transportation;Help with stairs or ramp for entrance ? ?  ?Equipment Recommendations None recommended by PT  ?Recommendations for Other Services ?    ?  ?Functional Status Assessment Patient has had a recent decline in their functional status and demonstrates the ability to make significant improvements in function in a reasonable and predictable amount of time.  ? ?  ?Precautions /  Restrictions Precautions ?Precautions: Fall ?Precaution Comments: Parkinson's features per chart ?Restrictions ?Weight Bearing Restrictions: No  ? ?  ? ?Mobility ? Bed Mobility ?Overal bed mobility: Needs Assistance ?Bed Mobility: Supine to Sit ?  ?  ?Supine to sit: Min assist ?  ?  ?General bed mobility comments: patient able to walk feet over to EOB. Assist for trunk elevation but patient able to scoot hips towards EOB in preparation to stand ?  ? ?Transfers ?Overall transfer level: Needs assistance ?Equipment used: Rolling Maleni Seyer (2 wheels) ?Transfers: Sit to/from Stand, Bed to chair/wheelchair/BSC ?Sit to Stand: Mod assist, +2 physical assistance, +2 safety/equipment ?  ?Step pivot transfers: Min assist ?  ?  ?  ?General transfer comment: ModA+2 to stand from low bed surface for boost up into standing. Cues for hand placement and hip extension to come into upright standing. Incontinent of urine with transitioning to standing. Able to stand x 5 minutes for linen change. She was able to take sidesteps towards [facility] with minA for balance and Rw management ?  ? ?Ambulation/Gait ?  ?  ?  ?  ?  ?  ?  ?  ? ?Stairs ?  ?  ?  ?  ?  ? ?Wheelchair Mobility ?  ? ?Modified Rankin (Stroke Patients Only) ?  ? ?  ? ?Balance Overall balance assessment: Needs assistance ?Sitting-balance support: Feet supported ?Sitting balance-Leahy Scale: Fair ?  ?  ?Standing balance support: Reliant on assistive device for balance ?Standing balance-Leahy Scale: Poor ?  ?  ?  ?  ?  ?  ?  ?  ?  ?  ?  ?  ?   ? ? ? ?Pertinent Vitals/Pain Pain Assessment ?Pain Assessment: No/denies pain  ? ? ?Home Living Family/patient  expects to be discharged to:: Private residence ?Living Arrangements: Spouse/significant other ?Available Help at Discharge: Family;Available 24 hours/day ?Type of Home: House ?Home Access: Ramped entrance ?  ?  ?  ?Home Layout: One level ?Home Equipment: Conservation officer, nature (2 wheels);Cane - single point;Grab bars - toilet;Grab bars -  tub/shower;Hand held shower head ?   ?  ?Prior Function Prior Level of Function : Needs assist ?  ?  ?  ?Physical Assist : Mobility (physical);ADLs (physical) ?  ?  ?Mobility Comments: able to ambulate with RW short distances in the home ?ADLs Comments: Daughter and husband has been assisting with bathing in the past 2 weeks ?  ? ? ?Hand Dominance  ?   ? ?  ?Extremity/Trunk Assessment  ? Upper Extremity Assessment ?Upper Extremity Assessment: Defer to OT evaluation ?  ? ?Lower Extremity Assessment ?Lower Extremity Assessment: Generalized weakness (bilateral LE grossly 3+/5) ?  ? ?Cervical / Trunk Assessment ?Cervical / Trunk Assessment: Kyphotic  ?Communication  ? Communication: No difficulties  ?Cognition Arousal/Alertness: Awake/alert ?Behavior During Therapy: [facility] for tasks assessed/performed ?Overall Cognitive Status: Within Functional Limits for tasks assessed ?  ?  ?  ?  ?  ?  ?  ?  ?  ?  ?  ?  ?  ?  ?  ?  ?General Comments: husband present and seems to be baseline cognition. WFL for tasks assessed this session ?  ?  ? ?  ?General Comments General comments (skin integrity, edema, etc.): Husband present and supportive - very helpful ? ?  ?Exercises    ? ?Assessment/Plan  ?  ?PT Assessment Patient needs continued PT services  ?PT Problem List Decreased strength;Decreased activity tolerance;Decreased balance;Decreased mobility;Cardiopulmonary status limiting activity ? ?   ?  ?PT Treatment Interventions DME instruction;Gait training;Functional mobility training;Therapeutic activities;Therapeutic exercise;Balance training;Neuromuscular re-education;Patient/family education   ? ?PT Goals (Current goals can be found in the Care Plan section)  ?Acute Rehab PT Goals ?Patient Stated Goal: home with husband ?PT Goal Formulation: With patient/family ?Time For Goal Achievement: 03/22/22 ?Potential to Achieve Goals: Good ? ?  ?Frequency Min 3X/week ?  ? ? ?Co-evaluation PT/OT/SLP Co-Evaluation/Treatment: Yes ?Reason for  Co-Treatment: For patient/therapist safety;To address functional/ADL transfers ?PT goals addressed during session: Mobility/safety with mobility;Balance ?  ?  ? ? ?  ?AM-PAC PT "6 Clicks" Mobility  ?Outcome Measure Help needed turning from your back to your side while in a flat bed without using bedrails?: A Little ?Help needed moving from lying on your back to sitting on the side of a flat bed without using bedrails?: A Little ?Help needed moving to and from a bed to a chair (including a wheelchair)?: A Little ?Help needed standing up from a chair using your arms (e.g., wheelchair or bedside chair)?: Total ?Help needed to walk in hospital room?: A Lot ?Help needed climbing 3-5 steps with a railing? : Total ?6 Click Score: 13 ? ?  ?End of Session Equipment Utilized During Treatment: Gait belt ?Activity Tolerance: Patient tolerated treatment well ?Patient left: in bed;with call bell/phone within reach;with family/visitor present (seated EOB) ?Nurse Communication: Mobility status;Other (comment) (patient was seated EOB) ?PT Visit Diagnosis: Muscle weakness (generalized) (M62.81);Other abnormalities of gait and mobility (R26.89) ?  ? ?Time: [id number] ?PT Time Calculation (min) (ACUTE ONLY): 34 min ? ? ?Charges:   PT Evaluation ?$PT Eval Moderate Complexity: 1 Mod ?  ?  ?   ? ? ?Terryn Redner A. Gilford Rile, PT, DPT ?Acute Rehabilitation Services ?Pager [phone] ?Office [phone] ? ? ?  Markevius Trombetta A Kailea Dannemiller ?03/08/2022, 5:07 PM ? ?

## 2022-03-08 NOTE — Progress Notes (Signed)
Neurology Progress Note ? ?Brief HPI: 81 year old female with a PMH of PAF, complete heart block s/p PPM, HLD, nonobstructive CAD, CKD stage III, and hypothyroidism who presented to the hospital with complaints of approximately 3 months of worsening p.o. intake, early satiety, and 3 to 4 weeks of progressive generalized weakness, decreased mobility, lethargy, and increased hand tremor. ? ?Subjective: ?No acute overnight events noted  ? ?Exam: ?Vitals:  ? 03/08/22 0508 03/08/22 0902  ?BP: (!) 114/56 (!) 114/54  ?Pulse: 79 83  ?Resp: 16 18  ?Temp: 97.7 ?F (36.5 ?C) 98.3 ?F (36.8 ?C)  ?SpO2: 95% 95%  ? ?Gen: Sitting up at bedside, in no acute distress ?Resp: non-labored breathing, no respiratory distress ?Abd: soft, non-tender, non-distended ? ?NEURO:  ?Mental Status: Awake, alert, oriented to person, place, time, and situation. ?She is able to provide a clear and coherent history of present illness. ?Speech/Language: speech is fluent without evidence of dysarthria or aphasia.    ?No neglect is noted ?Cranial Nerves:  ?II: PERRL. Visual fields full.  ?III, IV, VI: EOMI without ptosis, gaze preference, or nystagmus.  ?V: Sensation is intact to light touch and symmetrical to face.  ?VII: Face is symmetric resting and smiling with minimal smile to command.  ?VIII: Hearing is intact to voice ?IX, X: Palate elevation is symmetric. Hypophonic.   ?XI: Normal sternocleidomastoid and trapezius muscle strength ?XII: Tongue protrudes midline without fasciculations.   ?Motor: 5/5 strength present in bilateral upper extremities with minimal bilateral hand tremor with intention that is not present at rest. Bilateral lower extremities 4/5 strength bilaterally.  ?There is no definite rigidity on exam.  ?Sensation: Intact to light touch bilaterally in all four extremities. No extinction to DSS present.  ?Coordination: FTN intact bilaterally.  ?DTRs: 3+ hyperreflexia throughout ?Gait: Deferred for patient safety with recent reports of  orthostasis. ? ?Pertinent Labs: ?CBC ?   ?Component Value Date/Time  ? WBC 9.3 03/08/2022 0720  ? RBC 3.24 (L) 03/08/2022 0720  ? HGB 9.3 (L) 03/08/2022 0720  ? HGB 11.0 (L) 06/06/2021 [id number]  ? HCT 29.2 (L) 03/08/2022 0720  ? HCT 32.2 (L) 06/06/2021 [id number]  ? PLT 138 (L) 03/08/2022 0720  ? PLT 157 06/06/2021 0821  ? MCV 90.1 03/08/2022 0720  ? MCV 91 06/06/2021 0821  ? MCH 28.7 03/08/2022 0720  ? MCHC 31.8 03/08/2022 0720  ? RDW 15.2 03/08/2022 0720  ? RDW 12.4 06/06/2021 0821  ? LYMPHSABS 0.9 03/07/2022 1009  ? LYMPHSABS 0.6 (L) 06/06/2021 [id number]  ? MONOABS 0.5 03/07/2022 1009  ? EOSABS 0.2 03/07/2022 1009  ? EOSABS 0.1 06/06/2021 0821  ? BASOSABS 0.1 03/07/2022 1009  ? BASOSABS 0.1 06/06/2021 0821  ? ?CMP  ?   ?Component Value Date/Time  ? NA 136 03/08/2022 0720  ? NA 137 06/06/2021 0821  ? K 3.6 03/08/2022 0720  ? CL 104 03/08/2022 0720  ? CO2 22 03/08/2022 0720  ? GLUCOSE 106 (H) 03/08/2022 0720  ? BUN 29 (H) 03/08/2022 0720  ? BUN 30 (H) 06/06/2021 [id number]  ? CREATININE 1.39 (H) 03/08/2022 0720  ? CALCIUM 9.2 03/08/2022 0720  ? PROT 5.8 (L) 03/08/2022 0720  ? PROT 6.9 06/06/2021 0821  ? ALBUMIN 2.2 (L) 03/08/2022 0720  ? ALBUMIN 4.6 06/06/2021 0821  ? AST 28 03/08/2022 0720  ? ALT 48 (H) 03/08/2022 0720  ? ALKPHOS 47 03/08/2022 0720  ? BILITOT 1.2 03/08/2022 0720  ? BILITOT 0.6 06/06/2021 0821  ? GFRNONAA 38 (L) 03/08/2022 0720  ? GFRAA  36 (L) 08/28/2020 0524  ? ?Urinalysis ?   ?Component Value Date/Time  ? [place] YELLOW 03/07/2022 1157  ? APPEARANCEUR HAZY (A) 03/07/2022 1157  ? LABSPEC 1.015 03/07/2022 1157  ? PHURINE 5.0 03/07/2022 1157  ? GLUCOSEU NEGATIVE 03/07/2022 1157  ? HGBUR LARGE (A) 03/07/2022 1157  ? [place] NEGATIVE 03/07/2022 1157  ? [place] NEGATIVE 03/07/2022 1157  ? PROTEINUR 30 (A) 03/07/2022 1157  ? UROBILINOGEN 0.2 10/26/2015 0957  ? NITRITE NEGATIVE 03/07/2022 1157  ? LEUKOCYTESUR NEGATIVE 03/07/2022 1157  ? ?Lab Results  ?Component Value Date  ? [id number] 03/08/2022  ? ?Imaging  Reviewed: ? ?CT cervical spine wo contrast 3/17: ?No acute displaced fracture or traumatic listhesis of the cervical spine. ? ? ?Anibal Henderson, AGACNP-BC ?[facility] ?[phone] ? ?I have seen the patient and reviewed the above note. ? ?Assessment:  81 y.o. female who presented to the ED for evaluation of ongoing progressive weakness, decreased PO intake, and early satiety.  Though the admitting physician had some concern for possible parkinsonism, there is no evidence of this on my exam.  She does continue to have significant hyperreflexia, and also endorses some new urinary incontinence over the past few weeks.  Given progressive weakness and gait dysfunction coupled with severe hyperreflexia, I have recommended CT myelogram, but the patient wants to think about it. ? ?A compressive myelopathy would not contribute to the anorexia, but it could explain her more recent progressive weakness.  Also possible be a nutritional myelopathy. ? ?Recommendations: ?- Vitamin B12 borderline low for neurologic standards, recommend supplementation for a goal B12 of > 400, check MMA ?- Continue thiamine supplementation at 100 mg/day ?- Follow up on pending labs: copper, vitamin B1, RPR, MMA ?- Unable to obtain MRI due to PPM, recommend CT myelogram ? ?Roland Rack, MD ?[facility] ?[phone] ? ?If 7pm- 7am, please page neurology on call as listed in [place]. ? ?

## 2022-03-08 NOTE — TOC Progression Note (Signed)
Transition of Care (TOC) - Progression Note  ? ? ?Patient Details  ?Name: Lauren Henry ?MRN: [id number] ?Date of Birth: 1941/03/14 ? ?Transition of Care (TOC) CM/SW Contact  ?Bess Kinds, RN ?Phone Number: [phone] ?03/08/2022, 5:03 PM ? ?Clinical Narrative:    ? ?Notified that patient is active with [facility] for PT. Will need resumption orders at discharge.  ? ?  ?  ? ?Expected Discharge Plan and Services ?  ?  ?  ?  ?  ?                ?  ?  ?  ?  ?  ?  ?  ?  ?  ?  ? ? ?Social Determinants of Health (SDOH) Interventions ?  ? ?Readmission Risk Interventions ?No flowsheet data found. ? ?

## 2022-03-08 NOTE — Care Management Obs Status (Signed)
MEDICARE OBSERVATION STATUS NOTIFICATION ? ? ?Patient Details  ?Name: Lauren Henry ?MRN: [id number] ?Date of Birth: 1941/11/28 ? ? ?Medicare Observation Status Notification Given:  Yes ? ? ? ?Bartholomew Crews, RN ?03/08/2022, 5:54 PM ?

## 2022-03-08 NOTE — Evaluation (Signed)
Occupational Therapy Evaluation ?Patient Details ?Name: Lauren Henry ?MRN: [id number] ?DOB: 04-Sep-1941 ?Today's Date: 03/08/2022 ? ? ?History of Present Illness 81 y/o female presented to ED on 03/07/22 for weakness x 2 weeks and not being able to walk x 2 weeks. Concerned for failure to thrive in adult. PMHx:  low back pain, complete heart block status post pacemaker, atrial fibrillation, hypothyroidism, hyperlipidemia, HTN, gastroenteropathy, CKD 3, CAD, anemia  ? ?Clinical Impression ?  ?Pt admitted for concerns listed above. PTA pt and husband reported that she was independent with ADL's and functional mobility, however she has been having a steady decline functionally since christmas. At this time, pt presents with decreased activity tolerance and strength. She requires mod A +2 for safety with all mobility and min-max A for all ADL's. Recommending HH therapies at home to maximize her independence and safety. OT will follow acutely.   ?   ? ?Recommendations for follow up therapy are one component of a multi-disciplinary discharge planning process, led by the attending physician.  Recommendations may be updated based on patient status, additional functional criteria and insurance authorization.  ? ?Follow Up Recommendations ? Home health OT  ?  ?Assistance Recommended at Discharge Intermittent Supervision/Assistance  ?Patient can return home with the following A little help with walking and/or transfers;A little help with bathing/dressing/bathroom;Assistance with cooking/housework ? ?  ?Functional Status Assessment ? Patient has had a recent decline in their functional status and demonstrates the ability to make significant improvements in function in a reasonable and predictable amount of time.  ?Equipment Recommendations ? None recommended by OT  ?  ?Recommendations for Other Services   ? ? ?  ?Precautions / Restrictions Precautions ?Precautions: Fall ?Precaution Comments: Parkinson's features per  chart ?Restrictions ?Weight Bearing Restrictions: No  ? ?  ? ?Mobility Bed Mobility ?Overal bed mobility: Needs Assistance ?Bed Mobility: Supine to Sit ?  ?  ?Supine to sit: Min assist ?  ?  ?General bed mobility comments: patient able to walk feet over to EOB. Assist for trunk elevation but patient able to scoot hips towards EOB in preparation to stand ?  ? ?Transfers ?Overall transfer level: Needs assistance ?Equipment used: Rolling walker (2 wheels) ?Transfers: Sit to/from Stand, Bed to chair/wheelchair/BSC ?Sit to Stand: Mod assist, +2 physical assistance, +2 safety/equipment ?  ?  ?Step pivot transfers: Min assist ?  ?  ?General transfer comment: ModA+2 to stand from low bed surface for boost up into standing. Cues for hand placement and hip extension to come into upright standing. Incontinent of urine with transitioning to standing. Able to stand x 5 minutes for linen change. She was able to take sidesteps towards [facility] with minA for balance and Rw management ?  ? ?  ?Balance Overall balance assessment: Needs assistance ?Sitting-balance support: Feet supported ?Sitting balance-Leahy Scale: Fair ?  ?  ?Standing balance support: Reliant on assistive device for balance ?Standing balance-Leahy Scale: Poor ?  ?  ?  ?  ?  ?  ?  ?  ?  ?  ?  ?  ?   ? ?ADL either performed or assessed with clinical judgement  ? ?ADL Overall ADL's : Needs assistance/impaired ?Eating/Feeding: Modified independent;Sitting;Bed level ?  ?Grooming: Set up;Sitting ?  ?Upper Body Bathing: Set up;Sitting;Standing ?  ?Lower Body Bathing: Minimal assistance;Sitting/lateral leans;Sit to/from stand ?  ?Upper Body Dressing : Sitting;Set up ?  ?Lower Body Dressing: Moderate assistance;Sitting/lateral leans ?  ?Toilet Transfer: Stand-pivot;Moderate assistance;+2 for safety/equipment;+2 for physical assistance ?  ?  Toileting- Clothing Manipulation and Hygiene: Minimal assistance;Sitting/lateral lean;Sit to/from stand ?  ?  ?  ?Functional mobility during  ADLs: Moderate assistance;+2 for physical assistance;+2 for safety/equipment ?   ? ? ? ?Vision Baseline Vision/History: 1 Wears glasses ?Ability to See in Adequate Light: 0 Adequate ?Patient Visual Report: No change from baseline ?Vision Assessment?: No apparent visual deficits  ?   ?Perception   ?  ?Praxis   ?  ? ?Pertinent Vitals/Pain Pain Assessment ?Pain Assessment: No/denies pain  ? ? ? ?Hand Dominance Right ?  ?Extremity/Trunk Assessment Upper Extremity Assessment ?Upper Extremity Assessment: Generalized weakness ?  ?Lower Extremity Assessment ?Lower Extremity Assessment: Defer to PT evaluation ?  ?Cervical / Trunk Assessment ?Cervical / Trunk Assessment: Kyphotic ?  ?Communication Communication ?Communication: No difficulties ?  ?Cognition Arousal/Alertness: Awake/alert ?Behavior During Therapy: [facility] for tasks assessed/performed ?Overall Cognitive Status: Within Functional Limits for tasks assessed ?  ?  ?  ?  ?  ?  ?  ?  ?  ?  ?  ?  ?  ?  ?  ?  ?General Comments: husband present and seems to be baseline cognition. WFL for tasks assessed this session ?  ?  ?General Comments  VSS on RA, husband present and supportive ? ?  ?Exercises   ?  ?Shoulder Instructions    ? ? ?Home Living Family/patient expects to be discharged to:: Private residence ?Living Arrangements: Spouse/significant other ?Available Help at Discharge: Family;Available 24 hours/day ?Type of Home: House ?Home Access: Ramped entrance ?  ?  ?Home Layout: One level ?  ?  ?Bathroom Shower/Tub: Walk-in shower ?  ?Bathroom Toilet: Handicapped height ?Bathroom Accessibility: Yes ?  ?Home Equipment: Conservation officer, nature (2 wheels);Cane - single point;Grab bars - toilet;Grab bars - tub/shower;Hand held shower head ?  ?  ?  ? ?  ?Prior Functioning/Environment Prior Level of Function : Needs assist ?  ?  ?  ?Physical Assist : Mobility (physical);ADLs (physical) ?  ?  ?Mobility Comments: able to ambulate with RW short distances in the home ?ADLs Comments: Daughter  and husband has been assisting with bathing in the past 2 weeks ?  ? ?  ?  ?OT Problem List: Decreased strength;Decreased activity tolerance;Impaired balance (sitting and/or standing);Decreased safety awareness ?  ?   ?OT Treatment/Interventions: Self-care/ADL training;Balance training;Therapeutic activities;DME and/or AE instruction;Patient/family education  ?  ?OT Goals(Current goals can be found in the care plan section) Acute Rehab OT Goals ?Patient Stated Goal: To get stronger ?OT Goal Formulation: With patient ?Time For Goal Achievement: 03/22/22 ?Potential to Achieve Goals: Good ?ADL Goals ?Pt Will Perform Grooming: with modified independence;standing ?Pt Will Perform Lower Body Bathing: with supervision;sitting/lateral leans;sit to/from stand ?Pt Will Perform Lower Body Dressing: with supervision;sitting/lateral leans;sit to/from stand ?Pt Will Transfer to Toilet: with modified independence;ambulating ?Pt Will Perform Toileting - Clothing Manipulation and hygiene: with modified independence;sitting/lateral leans;sit to/from stand  ?OT Frequency: Min 2X/week ?  ? ?Co-evaluation PT/OT/SLP Co-Evaluation/Treatment: Yes ?Reason for Co-Treatment: For patient/therapist safety;To address functional/ADL transfers ?PT goals addressed during session: Mobility/safety with mobility;Balance ?OT goals addressed during session: Strengthening/ROM;ADL's and self-care ?  ? ?  ?AM-PAC OT "6 Clicks" Daily Activity     ?Outcome Measure Help from another person eating meals?: None ?Help from another person taking care of personal grooming?: A Little ?Help from another person toileting, which includes using toliet, bedpan, or urinal?: A Lot ?Help from another person bathing (including washing, rinsing, drying)?: A Lot ?Help from another person to put on and  taking off regular upper body clothing?: A Little ?Help from another person to put on and taking off regular lower body clothing?: A Lot ?6 Click Score: 16 ?  ?End of Session  Equipment Utilized During Treatment: Gait belt;Rolling walker (2 wheels) ?Nurse Communication: Mobility status ? ?Activity Tolerance: Patient tolerated treatment well ?Patient left: in bed;with call bell/phone within r

## 2022-03-09 ENCOUNTER — Observation Stay (HOSPITAL_COMMUNITY): Payer: Medicare PPO

## 2022-03-09 DIAGNOSIS — R918 Other nonspecific abnormal finding of lung field: Secondary | ICD-10-CM | POA: Diagnosis not present

## 2022-03-09 DIAGNOSIS — R627 Adult failure to thrive: Secondary | ICD-10-CM | POA: Diagnosis not present

## 2022-03-09 DIAGNOSIS — E43 Unspecified severe protein-calorie malnutrition: Secondary | ICD-10-CM | POA: Insufficient documentation

## 2022-03-09 DIAGNOSIS — N200 Calculus of kidney: Secondary | ICD-10-CM | POA: Diagnosis not present

## 2022-03-09 LAB — BASIC METABOLIC PANEL WITH GFR
Anion gap: 11 (ref 5–15)
BUN: 29 mg/dL — ABNORMAL HIGH (ref 8–23)
CO2: 22 mmol/L (ref 22–32)
Calcium: 9 mg/dL (ref 8.9–10.3)
Chloride: 102 mmol/L (ref 98–111)
Creatinine, Ser: 1.46 mg/dL — ABNORMAL HIGH (ref 0.44–1.00)
GFR, Estimated: 36 mL/min — ABNORMAL LOW
Glucose, Bld: 147 mg/dL — ABNORMAL HIGH (ref 70–99)
Potassium: 3.5 mmol/L (ref 3.5–5.1)
Sodium: 135 mmol/L (ref 135–145)

## 2022-03-09 LAB — RPR: RPR Ser Ql: NONREACTIVE

## 2022-03-09 MED ORDER — IOHEXOL 9 MG/ML PO SOLN
ORAL | Status: AC
  Filled 2022-03-09: qty 1000

## 2022-03-09 MED ORDER — ENSURE ENLIVE PO LIQD: 237.0000 mL | Freq: Three times a day (TID) | ORAL | Status: DC

## 2022-03-09 MED ORDER — CYANOCOBALAMIN 500 MCG PO TABS: 500.0000 ug | ORAL_TABLET | Freq: Every day | ORAL | 0 refills | Status: AC

## 2022-03-09 MED ORDER — MELATONIN 3 MG PO TABS: 3.0000 mg | ORAL_TABLET | Freq: Every evening | ORAL | Status: DC | PRN

## 2022-03-09 MED ORDER — THIAMINE HCL 100 MG PO TABS: 100.0000 mg | ORAL_TABLET | Freq: Every day | ORAL | 0 refills | Status: AC

## 2022-03-09 NOTE — Progress Notes (Signed)
Initial Nutrition Assessment ? ?DOCUMENTATION CODES:  ? ?Severe malnutrition in context of acute illness/injury, Severe malnutrition in context of social or environmental circumstances ? ?INTERVENTION:  ? ?Ensure Enlive po TID, each supplement provides 350 kcal and 20 grams of protein. ? ?MVI with minerals daily. ? ?NUTRITION DIAGNOSIS:  ? ?Severe Malnutrition related to acute illness, social / environmental circumstances as evidenced by energy intake < or equal to 50% for > or equal to 1 month, percent weight loss (9% weight loss x 3 months). ? ?GOAL:  ? ?Patient will meet greater than or equal to 90% of their needs ? ?MONITOR:  ? ?PO intake, Supplement acceptance, Labs ? ?REASON FOR ASSESSMENT:  ? ?Consult ?Assessment of nutrition requirement/status ? ?ASSESSMENT:  ? ?81 yo female admitted with failure to thrive. PMH includes CKD stage 3b, hypothyroidism, CAD, PAF, HLD, HTN, CHD s/p PPM. ? ?Neurology and Cardiology have both been consulted and have signed off d/t no acute issues.  ? ?Per review of physician notes, patient has been eating poorly with a poor appetite since Christmas 2022 and 30 lb weight loss. Over the past 3 weeks, she has stopped eating, becomes full after only a few bites. She is consuming < 600 calories per day.  ?Weight history reviewed. 9% weight loss within 3 months is severe. ?PT and OT are working with patient to improve strength.   ? ?Currently on a regular diet. ?Meal intakes: 0-55% ? ?Labs reviewed.  ?Medications reviewed and include vitamin D3, ferrous sulfate, Remeron, MVI with minerals, thiamine. ? ?Patient meets criteria for severe malnutrition, given 9% weight loss within 3 months and intake meeting less than or equal to 50% of estimated energy requirement for the past few months. This could be related to acute illness or social/environmental circumstances.  ? ?NUTRITION - FOCUSED PHYSICAL EXAM: ? ?Unable to complete ? ?Diet Order:   ?Diet Order   ? ?       ?  Diet regular Room  service appropriate? Yes; Fluid consistency: Thin  Diet effective now       ?  ? ?  ?  ? ?  ? ? ?EDUCATION NEEDS:  ? ?Not appropriate for education at this time ? ?Skin:  Skin Assessment: Reviewed RN Assessment ? ?Last BM:  3/17 ? ?Height:  ? ?Ht Readings from Last 1 Encounters:  ?03/07/22 5\' 4"  (1.626 m)  ? ? ?Weight:  ? ?Wt Readings from Last 1 Encounters:  ?03/09/22 77.9 kg  ? ? ? ?BMI:  Body mass index is 29.48 kg/m?. ? ?Estimated Nutritional Needs:  ? ?Kcal:  1900-2100 ? ?Protein:  110-125 gm ? ?Fluid:  >/= 2 L ? ? ? ?Lucas Mallow RD, LDN, CNSC ?Please refer to Amion for contact information.                                                       ? ?

## 2022-03-09 NOTE — TOC Transition Note (Signed)
Transition of Care (TOC) - CM/SW Discharge Note ? ? ?Patient Details  ?Name: Lauren Henry ?MRN: [id number] ?Date of Birth: Oct 27, 1941 ? ?Transition of Care (TOC) CM/SW Contact:  ?Bess Kinds, RN ?Phone Number: [phone] ?03/09/2022, 4:29 PM ? ? ?Clinical Narrative:    ? ?[place] to transition home today. HH orders in for RN, PT, OT for resumption of services. [facility] notified. No further TOC needs identified at this time.  ? ?Final next level of care: Home w Home Health Services ?Barriers to Discharge: No Barriers Identified ? ? ?Patient Goals and CMS Choice ?  ?  ?  ? ?Discharge Placement ?  ?           ?  ?  ?  ?  ? ?Discharge Plan and Services ?  ?  ?           ?  ?  ?  ?  ?  ?HH Arranged: RN, OT, PT ?HH Agency: [facility] ?Date HH Agency Contacted: 03/09/22 ?Time HH Agency Contacted: 1629 ?Representative spoke with at [facility] Agency: Candise Bowens ? ?Social Determinants of Health (SDOH) Interventions ?  ? ? ?Readmission Risk Interventions ?No flowsheet data found. ? ? ? ? ?

## 2022-03-09 NOTE — Discharge Summary (Signed)
PatientPhysician Discharge Summary  ?Lauren Henry [id number] DOB: 1941-07-10 DOA: 03/07/2022 ? ?PCP: Haywood Pao, MD ? ?Admit date: 03/07/2022 ?Discharge date: 03/09/2022 ?30 Day Unplanned Readmission Risk Score   ? ?Flowsheet Row ED to Hosp-Admission (Discharged) from 02/20/2022 in [facility]  ?30 Day Unplanned Readmission Risk Score (%) 14.24 Filed at 02/23/2022 1201  ? ?  ? ? This score is the patient's risk of an unplanned readmission within 30 days of being discharged (0 -100%). The score is based on dignosis, age, lab data, medications, orders, and past utilization.   ?Low:  0-14.9   Medium: 15-21.9   High: 22-29.9   Extreme: 30 and above ? ?  ? ?  ? ? ? ?Admitted From: Home ?Disposition: Home ? ?Recommendations for Outpatient Follow-up:  ?Follow up with PCP in 1-2 weeks ?Please obtain BMP/CBC in one week ?Follow-up with urology if needed. ?Please arrange CT myelogram via PCP or neurology if patient agrees. ?Please follow up with your PCP on the following pending results: ?Unresulted Labs (From admission, onward)  ? ?  Start     Ordered  ? 03/08/22 2010  Methylmalonic acid, serum  Once,   R       ?Question:  Release to patient  Answer:  Immediate  ? 03/08/22 2009  ? 03/08/22 0830  Copper, serum  Once,   R       ? 03/08/22 0830  ? 03/08/22 0500  Vitamin B1  Once,   R       ? 03/08/22 0500  ? 03/08/22 0500  Vitamin E  Tomorrow morning,   R       ? 03/07/22 1812  ? 03/07/22 1009  Pathologist smear review  Once,   R       ? 03/07/22 1009  ? ?  ?  ? ?  ?  ? ? ?Home Health: Yes ?Equipment/Devices: none ? ?Discharge Condition: Stable ?CODE STATUS: Full code ?Diet recommendation: Cardiac ? ?Subjective: Patient seen and examined this morning.  Husband at the bedside.  Long discussion with both of them detailed below. ? ?Brief/Interim Summary: Lauren Henry is a 81 y.o. female with medical history significant of  complete heart block status post pacemaker placement, paroxysmal atrial  fibrillation, nonobstructive CAD, diastolic dysfunction per 2D echo done in 2019, hypothyroidism, chronic kidney disease stage III who was just discharged from the hospital on 02/23/22 for syncope in setting of UTI. She had orthostatic hypotension and bp meds discontinued/adjusted. Over the past 3 weeks she has just stopped eating and will take a few bites and say that she is full.  Reportedly she started to slow down her PO intake around Christmas. She was put on prednisone a week ago to increase her appetite and this helped some, but she is eating less than 600 calories/day. She has lost 30 pounds of weight since Christmas 2022. They deny any trauma or life changing event in December to trigger this.  ?  ?She has a resting tremor that is new over the past 3-4 weeks.  No other complaint.  Upon arrival to ED, she was hemodynamically stable.  Labs also looked fairly stable.  She was admitted with following issues.   ?  ?Failure to thrive in adult: She has been having poor appetite and poor p.o. intake since Christmas.  When discussed, she cannot come up with any reasons but says that she does not like the food.  She is fully alert and oriented.  She was encouraged to eat more.  Dietitian is consulted. Due to some concern of underlying occult malignancy, CT chest abdomen and pelvis was done which did not show any acute pathology or any etiology of her failure to thrive. ?  ?Parkinsonian features/resting tremors: She was presumed to have parkinsonian features and for that neurology was consulted but according to them, she does not have Parkinson symptoms and they obtained CT cervical spine which was negative as well.  MRI is contraindicated due to PPM.  B12 borderline low.  They recommended CT myelogram however patient declined that.  Patient and husband has been advised that if and when they make a decision to proceed with CT myelogram, they will need to ask their PCP or follow-up with neurology to arrange that as  outpatient although it was strongly recommended that this should be done as inpatient.  They verbalized understanding and decided to hold that for now and they will further discuss as outpatient. ?  ?Elevated troponin with new LBBB: She has very slight elevation of troponin which are flat and she has no acute ST-T wave changes on the EKG.  Cardiology was consulted however they signed off and recommended continue current management. ?Echo 02/22/22: EF of 55-60% normal LVF. Grade I DD ?  ?Microscopic hematuria ?Recently treated for UTI, asymptomatic ?Repeat UA today shows >50 RBC.  Hemoglobin is stable. ?  ?Hypothyroidism ?TSH wnl, but free T4 is elevated.  Continue Synthroid.  Will defer to PCP for changing Synthroid dose. ?  ?CAD in native artery ?LHC in 2019 showed mild to moderate non obstructive CAD ?Continue crestor/metoprolol and eliquis  ?  ?PAF (paroxysmal atrial fibrillation) ([place]) ?In NSR, paced.  ?Continue eliquis and metoprolol  ?  ?Stage 3b chronic kidney disease (CKD) ([place]): At baseline ?  ?Hyperlipidemia ?Continue crestor  ?  ?Essential hypertension ?Well controlled continue lopressor  ?  ?CHB (complete heart block) ([facility]) s/p PPM ?Device interrogated at last hospital stay a week ago with no events ?Continue telemetry  ?  ?Generalized weakness with weight loss: She appears to be very weak physically, PT OT consulted, they recommended home health. ? ?Borderline B12 deficiency: Per neurology, they would recommend that her B12 should be over 400 but this is just under 400 so they recommended supplementing and that she is being discharged on B12 as well as B1 supplementation. ? ?CT chest showed 8 mm noncalcified nodule in the right lower lobe, underlying neoplastic process cannot be ruled out and per radiology recommendations, we recommend follow-up CT in 2 to 3 months.  Will defer to PCP to arrange that.  She has no clinical signs or symptoms of pneumonia. ? ?Nephrolithiasis: CT also showed incidental 1 mm  nonobstructing left renal stone.  Patient has no symptoms.  No hydronephrosis. ? ?I had a very lengthy discussion with the patient and her husband.  Since there is no cause found, they prefer to go home and they prefer to go home today. ? ?Discharge Diagnoses:  ?Principal Problem: ?  Failure to thrive in adult ?Active Problems: ?  Parkinsonian features ?  Elevated troponin with new LBBB ?  Microscopic hematuria ?  Hypothyroidism ?  CAD in native artery ?  PAF (paroxysmal atrial fibrillation) ([place]) ?  Stage 3b chronic kidney disease (CKD) ([place]) ?  Hyperlipidemia ?  Essential hypertension ?  CHB (complete heart block) (H[facility]) s/p PPM ?  Protein-calorie malnutrition, severe ? ? ? ?Discharge Instructions ? ? ?Allergies as of 03/09/2022   ? ?  Reactions  ? Methimazole Other (See Comments)  ? Elevated LFT's per [facility]   ? Olmesartan Other (See Comments)  ? enteritis  ? Amlodipine Swelling, Rash  ? ?  ? ?  ?Medication List  ?  ? ?STOP taking these medications   ? ?cefdinir 300 MG capsule ?Commonly known as: OMNICEF ?  ?predniSONE 10 MG tablet ?Commonly known as: DELTASONE ?  ? ?  ? ?TAKE these medications   ? ?apixaban 2.5 MG Tabs tablet ?Commonly known as: Eliquis ?Take 1 tablet (2.5 mg total) by mouth 2 (two) times daily. ?  ?dorzolamide 2 % ophthalmic solution ?Commonly known as: TRUSOPT ?Place 1 drop into the left eye 2 (two) times daily. ?  ?ferrous sulfate 325 (65 FE) MG tablet ?Take 325 mg by mouth daily with breakfast. ?  ?ipratropium 0.03 % nasal spray ?Commonly known as: ATROVENT ?Place 2 sprays into both nostrils 2 (two) times daily as needed for rhinitis. ?  ?levothyroxine 150 MCG tablet ?Commonly known as: SYNTHROID ?Take 150-225 mcg by mouth See admin instructions. Take one tablet (150 mcg) by mouth daily before breakfast, on 2 days of the week take an additional 1/2 tablet (75 mg) for a total dose on those days of 225 mcg ?  ?metoprolol tartrate 25 MG tablet ?Commonly known as: LOPRESSOR ?Take 0.5  tablets (12.5 mg total) by mouth 2 (two) times daily. ?  ?mirtazapine 7.5 MG tablet ?Commonly known as: REMERON ?Take 7.5 mg by mouth at bedtime. ?  ?multivitamin with minerals Tabs tablet ?Take 1 tablet by mouth

## 2022-03-10 LAB — COPPER, SERUM: Copper: 126 ug/dL (ref 80–158)

## 2022-03-11 LAB — METHYLMALONIC ACID, SERUM: Methylmalonic Acid, Quantitative: 216 nmol/L (ref 0–378)

## 2022-03-11 LAB — VITAMIN B1: Vitamin B1 (Thiamine): 144.8 nmol/L (ref 66.5–200.0)

## 2022-03-11 LAB — PATHOLOGIST SMEAR REVIEW

## 2022-03-12 ENCOUNTER — Encounter: Payer: Self-pay | Admitting: Physician Assistant

## 2022-03-12 ENCOUNTER — Ambulatory Visit: Payer: Medicare PPO | Admitting: Physician Assistant

## 2022-03-12 ENCOUNTER — Other Ambulatory Visit: Payer: Self-pay

## 2022-03-12 VITALS — BP 120/60 | HR 91 | Ht 65.0 in | Wt 169.0 lb

## 2022-03-12 DIAGNOSIS — I10 Essential (primary) hypertension: Secondary | ICD-10-CM | POA: Diagnosis not present

## 2022-03-12 DIAGNOSIS — Z950 Presence of cardiac pacemaker: Secondary | ICD-10-CM | POA: Diagnosis not present

## 2022-03-12 DIAGNOSIS — I2510 Atherosclerotic heart disease of native coronary artery without angina pectoris: Secondary | ICD-10-CM

## 2022-03-12 DIAGNOSIS — R630 Anorexia: Secondary | ICD-10-CM | POA: Diagnosis not present

## 2022-03-12 DIAGNOSIS — I480 Paroxysmal atrial fibrillation: Secondary | ICD-10-CM | POA: Diagnosis not present

## 2022-03-12 DIAGNOSIS — R7401 Elevation of levels of liver transaminase levels: Secondary | ICD-10-CM

## 2022-03-12 DIAGNOSIS — E7800 Pure hypercholesterolemia, unspecified: Secondary | ICD-10-CM | POA: Diagnosis not present

## 2022-03-12 LAB — CULTURE, BLOOD (SINGLE)
Culture: NO GROWTH
Special Requests: ADEQUATE

## 2022-03-12 NOTE — Progress Notes (Signed)
?Cardiology Office Note:   ? ?Date:  03/12/2022  ? ?ID:  CHANTRA WERTHEIMER, DOB 02-10-1941, MRN [id number] ? ?PCP:  Haywood Pao, MD ?  ?[facility] Providers ?Cardiologist:  Skeet Latch, MD ?Electrophysiologist:  Cristopher Peru, MD    ? ?Referring MD: Haywood Pao, MD  ? ?Chief Complaint  ?Patient presents with  ? Follow-up  ?  Post hospital.  ? Headache  ? ? ?History of Present Illness:   ? ?Lauren Henry is a 81 y.o. female with a hx of CHB s/p PPM, PAF, mild to moderate CAD, hypertension and hyperlipidemia.  Patient had a pacemaker implanted by Dr. Lovena Le in 2015 due to complete heart block.  Complete heart block was found during preoperative evaluation for knee surgery while she was a minimally symptomatic.  Echocardiogram in September 2019 showed EF 45 to 50%, diffuse hypokinesis, severe mitral annular calcification.  Myoview in October 2019 revealed a reversible defect in the inferior, apical and inferolateral region, with associated apical akinesis and a inferoapical dyskinesis.  This was felt to be either ischemia or pacemaker artifact.  Given the fact the patient was a still symptomatic and had a CKD, medical therapy therapy was elected.  She was later admitted for hypertensive urgency.  Troponin mildly elevated.  She ultimately underwent cardiac catheterization in November 2019 that revealed mild to moderate nonobstructive CAD, proximal LAD FFR was unremarkable.  Patient was last seen by Dr. Martinique in December 2022 at which time she was doing well and her blood pressure looks to be very well controlled.  In February 2023, patient was admitted for postural dizziness with presyncope.  This was felt to be due to orthostatic hypotension in the setting of multiple antihypertensives and a decreased oral intake.  If she was treated for UTI at that time.  Since then, she had no further episode of syncope.  Her PCP placed her on a short course of prednisone in effort to stimulate her appetite.   However follow-up lab work shows significantly elevated FTs, therefore patient was readmitted to the hospital on 03/07/2022.  Since December 2022, she lost roughly 30 pounds due to lack of appetite.  Cardiology service was consulted during the hospitalization due to elevated troponin and LBBB.  She denied any chest pain or worsening dyspnea.  She was seen by Dr. Burt Knack.  Echocardiogram in March demonstrated EF 55 to 60%, no regional wall motion abnormality, moderate LVH, mild to moderate LAE, no significant valve issue.  Given the reassuring echocardiogram and the mildly elevated troponin in the setting of her CKD, it was unlikely that cardiac pathology was contributing to her failure to thrive.  During the hospitalization, patient also had parkinsonian features and resting tremors, MRI was contraindicated due to pacemaker.  Neurology recommended CT myelogram at some point as outpatient.  This can be arranged by primary care provider. ? ?Patient presents today for follow-up along with her husband.  During the recent admission, her free T4 was elevated.  This will need to be addressed by primary care provider.  Otherwise she denies any chest pain or worsening dyspnea.  She has been on Crestor for a long time and never had any loss of appetite associated with medication.  Liver function has normalized.  I recommended we start Crestor and repeat liver function test in 1 month.  Otherwise, she can follow-up with Dr. Martinique in June as previously scheduled. ? ? ?Past Medical History:  ?Diagnosis Date  ? Arthritis   ? Atrial  fibrillation ([place])   ? Diarrhea   ? since gallbladder removed  ? Dysrhythmia   ? 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 History of blood transfusion as a child  ? no abnormal reaction noted  ? History of colon polyps   ? Hyperlipidemia   ? takes Pravastatin daily  ? Hypertension   ? takes Azor and Metoprolol daily  ? Hypothyroidism   ? takes Synthroid daily  ? Joint  pain   ? Presence of permanent cardiac pacemaker   ? Urinary frequency   ? Urinary urgency   ? ? ?Past Surgical History:  ?Procedure Laterality Date  ? ABDOMINAL HYSTERECTOMY    ? cataract surgery Bilateral   ? CHOLECYSTECTOMY  1971  ? COLONOSCOPY    ? EYE SURGERY    ? INSERT / REPLACE / REMOVE PACEMAKER    ? Medtronic PPM 11/01/14 (Dr. Cristopher Peru)  ? JOINT REPLACEMENT Right   ? knee  ? LEFT HEART CATH AND CORONARY ANGIOGRAPHY N/A 11/01/2018  ? Procedure: LEFT HEART CATH AND CORONARY ANGIOGRAPHY;  Surgeon: Nelva Bush, MD;  Location: [facility];  Service: Cardiovascular;  Laterality: N/A;  ? PERMANENT PACEMAKER INSERTION N/A 11/01/2014  ? Procedure: PERMANENT PACEMAKER INSERTION;  Surgeon: Evans Lance, MD;  Location: [facility] CATH [facility];  Service: Cardiovascular;  Laterality: N/A;  ? stomach stapled  1985  ? TOTAL KNEE ARTHROPLASTY Left 11/05/2015  ? Procedure: TOTAL KNEE ARTHROPLASTY;  Surgeon: Vickey Huger, MD;  Location: [facility];  Service: Orthopedics;  Laterality: Left;  ? ? ?Current Medications: ?Current Meds  ?Medication Sig  ? apixaban (ELIQUIS) 2.5 MG TABS tablet Take 1 tablet (2.5 mg total) by mouth 2 (two) times daily.  ? Cholecalciferol (VITAMIN D3) 50 MCG (2000 UT) TABS Take 2,000 Units by mouth every morning.  ? dorzolamide (TRUSOPT) 2 % ophthalmic solution Place 1 drop into the left eye 2 (two) times daily.  ? ferrous sulfate 325 (65 FE) MG tablet Take 325 mg by mouth daily with breakfast.  ? ipratropium (ATROVENT) 0.03 % nasal spray Place 2 sprays into both nostrils 2 (two) times daily as needed for rhinitis.  ? levothyroxine (SYNTHROID, LEVOTHROID) 150 MCG tablet Take 150-225 mcg by mouth See admin instructions. Take one tablet (150 mcg) by mouth daily before breakfast, on 2 days of the week take an additional 1/2 tablet (75 mg) for a total dose on those days of 225 mcg  ? metoprolol tartrate (LOPRESSOR) 25 MG tablet Take 0.5 tablets (12.5 mg total) by mouth 2 (two) times daily.  ? mirtazapine  (REMERON) 7.5 MG tablet Take 7.5 mg by mouth at bedtime.   ? Multiple Vitamin (MULTIVITAMIN WITH MINERALS) TABS tablet Take 1 tablet by mouth every morning.  ? ondansetron (ZOFRAN) 4 MG tablet Take 4 mg by mouth every 8 (eight) hours as needed for nausea or vomiting.  ? pantoprazole (PROTONIX) 40 MG tablet Take 40 mg by mouth every morning.  ? rosuvastatin (CRESTOR) 40 MG tablet TAKE 1 TABLET BY MOUTH DAILY  ? thiamine 100 MG tablet Take 1 tablet (100 mg total) by mouth daily.  ? vitamin B-12 (CYANOCOBALAMIN) 500 MCG tablet Take 1 tablet (500 mcg total) by mouth daily.  ? vitamin C (ASCORBIC ACID) 250 MG tablet Take 250 mg by mouth every morning.  ?  ? ?Allergies:   Methimazole, Olmesartan, and Amlodipine  ? ?Social History  ? ?Socioeconomic History  ? Marital status: Married  ?  Spouse name: Not on file  ? Number of children: Not on file  ? Years of education: Not on file  ? Highest education level: Not on file  ?Occupational History  ? Not on file  ?Tobacco Use  ? Smoking status: Never  ? Smokeless tobacco: Never  ?Vaping Use  ? Vaping Use: Never used  ?Substance and Sexual Activity  ? Alcohol use: Yes  ?  Alcohol/week: 1.0 standard drink  ?  Types: 1 Glasses of wine per week  ? Drug use: No  ? Sexual activity: Not Currently  ?  Birth control/protection: Surgical  ?Other Topics Concern  ? Not on file  ?Social History Narrative  ? Not on file  ? ?Social Determinants of Health  ? ?Financial Resource Strain: Not on file  ?Food Insecurity: Not on file  ?Transportation Needs: Not on file  ?Physical Activity: Not on file  ?Stress: Not on file  ?Social Connections: Not on file  ?  ? ?Family History: ?The patient's family history includes Heart attack in her father; Heart disease in her father; Stroke in her sister. ? ?ROS:   ?Please see the history of present illness.    ? All other systems reviewed and are negative. ? ?EKGs/Labs/Other Studies Reviewed:   ? ?The following studies were reviewed today: ? ?Echo 02/22/2022 ?1.  Left ventricular ejection fraction, by estimation, is 55 to 60%. The  ?left ventricle has normal function. The left ventricle has no regional  ?wall motion abnormalities. There is moderate concentric left

## 2022-03-12 NOTE — Patient Instructions (Signed)
Medication Instructions:  ?Restart Crestor 40 mg ( Take 1 Tablet Daily). ?*If you need a refill on your cardiac medications before your next appointment, please call your pharmacy* ? ? ?Lab Work: ?Hepatic Panel. 1 month ?If you have labs (blood work) drawn today and your tests are completely normal, you will receive your results only by: ?MyChart Message (if you have MyChart) OR ?A paper copy in the mail ?If you have any lab test that is abnormal or we need to change your treatment, we will call you to review the results. ? ? ?Testing/Procedures: ?No Testing ? ? ?Follow-Up: ?At [facility], you and your health needs are our priority.  As part of our continuing mission to provide you with exceptional heart care, we have created designated Provider Care Teams.  These Care Teams include your primary Cardiologist (physician) and Advanced Practice Providers (APPs -  Physician Assistants and Nurse Practitioners) who all work together to provide you with the care you need, when you need it. ? ?We recommend signing up for the patient portal called "MyChart".  Sign up information is provided on this After Visit Summary.  MyChart is used to connect with patients for Virtual Visits (Telemedicine).  Patients are able to view lab/test results, encounter notes, upcoming appointments, etc.  Non-urgent messages can be sent to your provider as well.   ?To learn more about what you can do with MyChart, go to [email or web address].   ? ?Your next appointment:   ?As Scheduled ? ?The format for your next appointment:   ?In Person ? ?Provider:   ?Peter Swazil[place], MD ? ? :1}  ? ? ?  ?

## 2022-03-13 ENCOUNTER — Emergency Department (HOSPITAL_COMMUNITY): Payer: Medicare PPO

## 2022-03-13 ENCOUNTER — Encounter (HOSPITAL_COMMUNITY): Payer: Self-pay

## 2022-03-13 ENCOUNTER — Other Ambulatory Visit: Payer: Self-pay

## 2022-03-13 ENCOUNTER — Observation Stay (HOSPITAL_COMMUNITY): Payer: Medicare PPO

## 2022-03-13 ENCOUNTER — Inpatient Hospital Stay (HOSPITAL_COMMUNITY)
Admission: EM | Admit: 2022-03-13 | Discharge: 2022-03-31 | DRG: 915 | Disposition: A | Discharge status: Home Health | Payer: Medicare PPO | Attending: Internal Medicine | Admitting: Internal Medicine

## 2022-03-13 DIAGNOSIS — R7989 Other specified abnormal findings of blood chemistry: Secondary | ICD-10-CM | POA: Diagnosis present

## 2022-03-13 DIAGNOSIS — B961 Klebsiella pneumoniae [K. pneumoniae] as the cause of diseases classified elsewhere: Secondary | ICD-10-CM | POA: Diagnosis present

## 2022-03-13 DIAGNOSIS — Z823 Family history of stroke: Secondary | ICD-10-CM

## 2022-03-13 DIAGNOSIS — I442 Atrioventricular block, complete: Secondary | ICD-10-CM | POA: Diagnosis present

## 2022-03-13 DIAGNOSIS — E43 Unspecified severe protein-calorie malnutrition: Secondary | ICD-10-CM | POA: Diagnosis present

## 2022-03-13 DIAGNOSIS — J9601 Acute respiratory failure with hypoxia: Secondary | ICD-10-CM | POA: Diagnosis not present

## 2022-03-13 DIAGNOSIS — E034 Atrophy of thyroid (acquired): Secondary | ICD-10-CM | POA: Diagnosis not present

## 2022-03-13 DIAGNOSIS — E039 Hypothyroidism, unspecified: Secondary | ICD-10-CM | POA: Diagnosis present

## 2022-03-13 DIAGNOSIS — T368X5A Adverse effect of other systemic antibiotics, initial encounter: Secondary | ICD-10-CM | POA: Diagnosis not present

## 2022-03-13 DIAGNOSIS — Z7189 Other specified counseling: Secondary | ICD-10-CM | POA: Diagnosis not present

## 2022-03-13 DIAGNOSIS — Z515 Encounter for palliative care: Secondary | ICD-10-CM | POA: Diagnosis not present

## 2022-03-13 DIAGNOSIS — I480 Paroxysmal atrial fibrillation: Secondary | ICD-10-CM | POA: Diagnosis present

## 2022-03-13 DIAGNOSIS — Z79899 Other long term (current) drug therapy: Secondary | ICD-10-CM

## 2022-03-13 DIAGNOSIS — D631 Anemia in chronic kidney disease: Secondary | ICD-10-CM | POA: Diagnosis present

## 2022-03-13 DIAGNOSIS — D61818 Other pancytopenia: Secondary | ICD-10-CM | POA: Diagnosis present

## 2022-03-13 DIAGNOSIS — E669 Obesity, unspecified: Secondary | ICD-10-CM | POA: Diagnosis present

## 2022-03-13 DIAGNOSIS — R918 Other nonspecific abnormal finding of lung field: Secondary | ICD-10-CM | POA: Diagnosis not present

## 2022-03-13 DIAGNOSIS — Z7901 Long term (current) use of anticoagulants: Secondary | ICD-10-CM

## 2022-03-13 DIAGNOSIS — Z6832 Body mass index (BMI) 32.0-32.9, adult: Secondary | ICD-10-CM

## 2022-03-13 DIAGNOSIS — D692 Other nonthrombocytopenic purpura: Secondary | ICD-10-CM | POA: Diagnosis not present

## 2022-03-13 DIAGNOSIS — Z8249 Family history of ischemic heart disease and other diseases of the circulatory system: Secondary | ICD-10-CM

## 2022-03-13 DIAGNOSIS — J690 Pneumonitis due to inhalation of food and vomit: Secondary | ICD-10-CM | POA: Diagnosis present

## 2022-03-13 DIAGNOSIS — D649 Anemia, unspecified: Secondary | ICD-10-CM | POA: Diagnosis present

## 2022-03-13 DIAGNOSIS — Z66 Do not resuscitate: Secondary | ICD-10-CM | POA: Diagnosis present

## 2022-03-13 DIAGNOSIS — T783XXA Angioneurotic edema, initial encounter: Secondary | ICD-10-CM | POA: Diagnosis present

## 2022-03-13 DIAGNOSIS — H1132 Conjunctival hemorrhage, left eye: Secondary | ICD-10-CM | POA: Diagnosis present

## 2022-03-13 DIAGNOSIS — I130 Hypertensive heart and chronic kidney disease with heart failure and stage 1 through stage 4 chronic kidney disease, or unspecified chronic kidney disease: Secondary | ICD-10-CM | POA: Diagnosis present

## 2022-03-13 DIAGNOSIS — R410 Disorientation, unspecified: Secondary | ICD-10-CM | POA: Diagnosis not present

## 2022-03-13 DIAGNOSIS — Z87442 Personal history of urinary calculi: Secondary | ICD-10-CM

## 2022-03-13 DIAGNOSIS — Z950 Presence of cardiac pacemaker: Secondary | ICD-10-CM

## 2022-03-13 DIAGNOSIS — N1832 Chronic kidney disease, stage 3b: Secondary | ICD-10-CM | POA: Diagnosis present

## 2022-03-13 DIAGNOSIS — R609 Edema, unspecified: Secondary | ICD-10-CM | POA: Diagnosis not present

## 2022-03-13 DIAGNOSIS — R627 Adult failure to thrive: Secondary | ICD-10-CM | POA: Diagnosis present

## 2022-03-13 DIAGNOSIS — E785 Hyperlipidemia, unspecified: Secondary | ICD-10-CM | POA: Diagnosis present

## 2022-03-13 DIAGNOSIS — R1312 Dysphagia, oropharyngeal phase: Secondary | ICD-10-CM | POA: Diagnosis present

## 2022-03-13 DIAGNOSIS — Z79890 Hormone replacement therapy (postmenopausal): Secondary | ICD-10-CM

## 2022-03-13 DIAGNOSIS — J9690 Respiratory failure, unspecified, unspecified whether with hypoxia or hypercapnia: Secondary | ICD-10-CM | POA: Diagnosis not present

## 2022-03-13 DIAGNOSIS — G9341 Metabolic encephalopathy: Secondary | ICD-10-CM | POA: Diagnosis present

## 2022-03-13 DIAGNOSIS — R5381 Other malaise: Secondary | ICD-10-CM | POA: Diagnosis present

## 2022-03-13 DIAGNOSIS — Z23 Encounter for immunization: Secondary | ICD-10-CM

## 2022-03-13 DIAGNOSIS — Z6830 Body mass index (BMI) 30.0-30.9, adult: Secondary | ICD-10-CM

## 2022-03-13 DIAGNOSIS — R7401 Elevation of levels of liver transaminase levels: Secondary | ICD-10-CM | POA: Diagnosis present

## 2022-03-13 DIAGNOSIS — N179 Acute kidney failure, unspecified: Secondary | ICD-10-CM | POA: Diagnosis present

## 2022-03-13 DIAGNOSIS — E872 Acidosis, unspecified: Secondary | ICD-10-CM | POA: Diagnosis present

## 2022-03-13 DIAGNOSIS — M7989 Other specified soft tissue disorders: Secondary | ICD-10-CM | POA: Diagnosis not present

## 2022-03-13 DIAGNOSIS — I5032 Chronic diastolic (congestive) heart failure: Secondary | ICD-10-CM | POA: Diagnosis present

## 2022-03-13 DIAGNOSIS — N189 Chronic kidney disease, unspecified: Secondary | ICD-10-CM | POA: Diagnosis not present

## 2022-03-13 DIAGNOSIS — R221 Localized swelling, mass and lump, neck: Secondary | ICD-10-CM | POA: Diagnosis not present

## 2022-03-13 DIAGNOSIS — Z96652 Presence of left artificial knee joint: Secondary | ICD-10-CM | POA: Diagnosis present

## 2022-03-13 DIAGNOSIS — J9602 Acute respiratory failure with hypercapnia: Secondary | ICD-10-CM | POA: Diagnosis not present

## 2022-03-13 DIAGNOSIS — R1310 Dysphagia, unspecified: Secondary | ICD-10-CM | POA: Diagnosis not present

## 2022-03-13 DIAGNOSIS — E162 Hypoglycemia, unspecified: Secondary | ICD-10-CM | POA: Diagnosis not present

## 2022-03-13 DIAGNOSIS — Z888 Allergy status to other drugs, medicaments and biological substances status: Secondary | ICD-10-CM

## 2022-03-13 DIAGNOSIS — R21 Rash and other nonspecific skin eruption: Secondary | ICD-10-CM | POA: Diagnosis present

## 2022-03-13 DIAGNOSIS — I6523 Occlusion and stenosis of bilateral carotid arteries: Secondary | ICD-10-CM | POA: Diagnosis not present

## 2022-03-13 DIAGNOSIS — J9811 Atelectasis: Secondary | ICD-10-CM | POA: Diagnosis not present

## 2022-03-13 DIAGNOSIS — J351 Hypertrophy of tonsils: Secondary | ICD-10-CM | POA: Diagnosis not present

## 2022-03-13 DIAGNOSIS — R4182 Altered mental status, unspecified: Secondary | ICD-10-CM | POA: Diagnosis not present

## 2022-03-13 DIAGNOSIS — I10 Essential (primary) hypertension: Secondary | ICD-10-CM | POA: Diagnosis not present

## 2022-03-13 DIAGNOSIS — H02846 Edema of left eye, unspecified eyelid: Secondary | ICD-10-CM | POA: Diagnosis not present

## 2022-03-13 DIAGNOSIS — I700 Atherosclerosis of aorta: Secondary | ICD-10-CM | POA: Diagnosis not present

## 2022-03-13 DIAGNOSIS — H5789 Other specified disorders of eye and adnexa: Principal | ICD-10-CM | POA: Diagnosis present

## 2022-03-13 DIAGNOSIS — D509 Iron deficiency anemia, unspecified: Secondary | ICD-10-CM | POA: Diagnosis not present

## 2022-03-13 DIAGNOSIS — M1811 Unilateral primary osteoarthritis of first carpometacarpal joint, right hand: Secondary | ICD-10-CM | POA: Diagnosis not present

## 2022-03-13 DIAGNOSIS — Z4682 Encounter for fitting and adjustment of non-vascular catheter: Secondary | ICD-10-CM | POA: Diagnosis not present

## 2022-03-13 DIAGNOSIS — M47812 Spondylosis without myelopathy or radiculopathy, cervical region: Secondary | ICD-10-CM | POA: Diagnosis not present

## 2022-03-13 DIAGNOSIS — R531 Weakness: Secondary | ICD-10-CM | POA: Diagnosis not present

## 2022-03-13 LAB — CBC WITH DIFFERENTIAL/PLATELET
Abs Immature Granulocytes: 0.05 10*3/uL (ref 0.00–0.07)
Basophils Absolute: 0 10*3/uL (ref 0.0–0.1)
Basophils Relative: 0 %
Eosinophils Absolute: 0 10*3/uL (ref 0.0–0.5)
Eosinophils Relative: 1 %
HCT: 28.5 % — ABNORMAL LOW (ref 36.0–46.0)
Hemoglobin: 9.2 g/dL — ABNORMAL LOW (ref 12.0–15.0)
Immature Granulocytes: 1 %
Lymphocytes Relative: 5 %
Lymphs Abs: 0.4 10*3/uL — ABNORMAL LOW (ref 0.7–4.0)
MCH: 29.2 pg (ref 26.0–34.0)
MCHC: 32.3 g/dL (ref 30.0–36.0)
MCV: 90.5 fL (ref 80.0–100.0)
Monocytes Absolute: 0.2 10*3/uL (ref 0.1–1.0)
Monocytes Relative: 3 %
Neutro Abs: 6.6 10*3/uL (ref 1.7–7.7)
Neutrophils Relative %: 90 %
Platelets: 150 10*3/uL (ref 150–400)
RBC: 3.15 MIL/uL — ABNORMAL LOW (ref 3.87–5.11)
RDW: 15.3 % (ref 11.5–15.5)
WBC: 7.3 10*3/uL (ref 4.0–10.5)
nRBC: 0 % (ref 0.0–0.2)

## 2022-03-13 LAB — TSH: TSH: 3.233 u[IU]/mL (ref 0.350–4.500)

## 2022-03-13 LAB — COMPREHENSIVE METABOLIC PANEL
ALT: 75 U/L — ABNORMAL HIGH (ref 0–44)
AST: 57 U/L — ABNORMAL HIGH (ref 15–41)
Albumin: 2.1 g/dL — ABNORMAL LOW (ref 3.5–5.0)
Alkaline Phosphatase: 66 U/L (ref 38–126)
Anion gap: 12 (ref 5–15)
BUN: 44 mg/dL — ABNORMAL HIGH (ref 8–23)
CO2: 22 mmol/L (ref 22–32)
Calcium: 9.8 mg/dL (ref 8.9–10.3)
Chloride: 102 mmol/L (ref 98–111)
Creatinine, Ser: 2.05 mg/dL — ABNORMAL HIGH (ref 0.44–1.00)
GFR, Estimated: 24 mL/min — ABNORMAL LOW (ref >60)
Glucose, Bld: 258 mg/dL — ABNORMAL HIGH (ref 70–99)
Potassium: 3.6 mmol/L (ref 3.5–5.1)
Sodium: 136 mmol/L (ref 135–145)
Total Bilirubin: 0.9 mg/dL (ref 0.3–1.2)
Total Protein: 6.1 g/dL — ABNORMAL LOW (ref 6.5–8.1)

## 2022-03-13 LAB — BRAIN NATRIURETIC PEPTIDE: B Natriuretic Peptide: 551.8 pg/mL — ABNORMAL HIGH (ref 0.0–100.0)

## 2022-03-13 LAB — C-REACTIVE PROTEIN: CRP: 34.9 mg/dL — ABNORMAL HIGH (ref <1.0)

## 2022-03-13 LAB — PROTIME-INR
INR: 1.7 — ABNORMAL HIGH (ref 0.8–1.2)
Prothrombin Time: 20.3 seconds — ABNORMAL HIGH (ref 11.4–15.2)

## 2022-03-13 LAB — GROUP A STREP BY PCR: Group A Strep by PCR: NOT DETECTED

## 2022-03-13 LAB — SEDIMENTATION RATE: Sed Rate: >140 mm/hr — ABNORMAL HIGH (ref 0–22)

## 2022-03-13 MED ORDER — PHENOL 1.4 % MT LIQD
1.0000 | OROMUCOSAL | Status: DC | PRN
  Administered 2022-03-13: 1 via OROMUCOSAL
  Filled 2022-03-13: qty 177

## 2022-03-13 MED ORDER — ALBUTEROL SULFATE (2.5 MG/3ML) 0.083% IN NEBU
2.5000 mg | INHALATION_SOLUTION | Freq: Four times a day (QID) | RESPIRATORY_TRACT | Status: DC | PRN
  Administered 2022-03-14: 2.5 mg via RESPIRATORY_TRACT
  Filled 2022-03-13: qty 3

## 2022-03-13 MED ORDER — SODIUM CHLORIDE 0.9% FLUSH
3.0000 mL | Freq: Two times a day (BID) | INTRAVENOUS | Status: DC
  Administered 2022-03-13 – 2022-03-31 (×30): 3 mL via INTRAVENOUS

## 2022-03-13 MED ORDER — METOPROLOL TARTRATE 12.5 MG HALF TABLET: 12.5000 mg | ORAL_TABLET | Freq: Two times a day (BID) | ORAL | Status: DC

## 2022-03-13 MED ORDER — LEVOTHYROXINE SODIUM 150 MCG PO TABS: 150.0000 ug | ORAL_TABLET | ORAL | Status: DC

## 2022-03-13 MED ORDER — LEVOTHYROXINE SODIUM 75 MCG PO TABS: 225.0000 ug | ORAL_TABLET | ORAL | Status: DC

## 2022-03-13 MED ORDER — ACETAMINOPHEN 650 MG RE SUPP: 650.0000 mg | Freq: Four times a day (QID) | RECTAL | Status: DC | PRN

## 2022-03-13 MED ORDER — HYDROCORTISONE 1 % EX CREA
TOPICAL_CREAM | Freq: Two times a day (BID) | CUTANEOUS | Status: AC
  Filled 2022-03-13 (×5): qty 28

## 2022-03-13 MED ORDER — APIXABAN 2.5 MG PO TABS
2.5000 mg | ORAL_TABLET | Freq: Two times a day (BID) | ORAL | Status: DC
  Filled 2022-03-13 (×2): qty 1

## 2022-03-13 MED ORDER — MIRTAZAPINE 15 MG PO TABS
7.5000 mg | ORAL_TABLET | Freq: Every day | ORAL | Status: DC
  Filled 2022-03-13: qty 1

## 2022-03-13 MED ORDER — PROBIOTIC BIOGAIA/SOOTHE NICU ORAL SYRINGE
5.0000 [drp] | Freq: Every day | ORAL | Status: DC
  Filled 2022-03-13 (×2): qty 5

## 2022-03-13 MED ORDER — SODIUM CHLORIDE 0.9 % IV BOLUS
500.0000 mL | Freq: Once | INTRAVENOUS | Status: AC
  Administered 2022-03-13: 500 mL via INTRAVENOUS

## 2022-03-13 MED ORDER — ACETAMINOPHEN 325 MG PO TABS: 650.0000 mg | ORAL_TABLET | Freq: Four times a day (QID) | ORAL | Status: DC | PRN

## 2022-03-13 MED ORDER — SODIUM CHLORIDE 0.9 % IV SOLN
INTRAVENOUS | Status: DC
Start: 2022-03-13 — End: 2022-03-20

## 2022-03-13 MED ORDER — CLINDAMYCIN PHOSPHATE 600 MG/50ML IV SOLN
600.0000 mg | Freq: Three times a day (TID) | INTRAVENOUS | Status: DC
  Administered 2022-03-13 – 2022-03-14 (×2): 600 mg via INTRAVENOUS
  Filled 2022-03-13 (×3): qty 50

## 2022-03-13 NOTE — Assessment & Plan Note (Signed)
Patient noted to have acute swelling of the left eye with conjunctival irritation and drainage appreciated on physical exam.  Unclear cause of symptoms at this time. ?- ?

## 2022-03-13 NOTE — Evaluation (Signed)
Clinical/Bedside Swallow Evaluation ?Patient Details  ?Name: Lauren Henry ?MRN: [id number] ?Date of Birth: 1941/08/12 ? ?Today's Date: 03/13/2022 ?Time: SLP Start Time (ACUTE ONLY): 1400 SLP Stop Time (ACUTE ONLY): [id number] ?SLP Time Calculation (min) (ACUTE ONLY): 15 min ? ?Past Medical History:  ?Past Medical History:  ?Diagnosis Date  ? Arthritis   ? Atrial fibrillation ([place])   ? Diarrhea   ? since gallbladder removed  ? Dysrhythmia   ? 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 History of blood transfusion as a child  ? no abnormal reaction noted  ? History of colon polyps   ? Hyperlipidemia   ? takes Pravastatin daily  ? Hypertension   ? takes Azor and Metoprolol daily  ? Hypothyroidism   ? takes Synthroid daily  ? Joint pain   ? Presence of permanent cardiac pacemaker   ? Urinary frequency   ? Urinary urgency   ? ?Past Surgical History:  ?Past Surgical History:  ?Procedure Laterality Date  ? ABDOMINAL HYSTERECTOMY    ? cataract surgery Bilateral   ? CHOLECYSTECTOMY  1971  ? COLONOSCOPY    ? EYE SURGERY    ? INSERT / REPLACE / REMOVE PACEMAKER    ? Medtronic PPM 11/01/14 (Dr. Cristopher Peru)  ? JOINT REPLACEMENT Right   ? knee  ? LEFT HEART CATH AND CORONARY ANGIOGRAPHY N/A 11/01/2018  ? Procedure: LEFT HEART CATH AND CORONARY ANGIOGRAPHY;  Surgeon: Nelva Bush, MD;  Location: [facility];  Service: Cardiovascular;  Laterality: N/A;  ? PERMANENT PACEMAKER INSERTION N/A 11/01/2014  ? Procedure: PERMANENT PACEMAKER INSERTION;  Surgeon: Evans Lance, MD;  Location: [facility] CATH LAB;  Service: Cardiovascular;  Laterality: N/A;  ? stomach stapled  1985  ? TOTAL KNEE ARTHROPLASTY Left 11/05/2015  ? Procedure: TOTAL KNEE ARTHROPLASTY;  Surgeon: Vickey Huger, MD;  Location: [facility];  Service: Orthopedics;  Laterality: Left;  ? ?HPI:  ?81 y.o female with a PMH of complete heart block, HTN, Afib on eliquis presents to the ED via ambulance with a chief complaint of right arm swelling and left  eye swollen x a few days. Pt failed [facility] swallow screen in ED. Has had recent admission, but no prior SLP visits.  ?  ?Assessment / Plan / Recommendation  ?Clinical Impression ? Patient was seen at bedside for clinical swallow evaluation. Pt's son was present at bedside. Upon entry into the room, SLP observed significant orofacial edema. Tongue is red and swollen with small bump on ventral surface. Oral mech exam revealed slight reduction in labial/lingual ROM d/t significant edema. Upper lip moderaely swollen as well. Pt also presented with open-mouth posture breathing; unable to redirect airflow through nose for respiration. SLP presented pt with ice chips 2x. Pt immediately coughed with trials of ice chips and displayed wet vocal quality. Pt endorsed that it felt like she was choking and did not want to eat/drink anything. At the end of the session, patient's son insisted on offering the pt straw sips of water, to which the pt had delayed wet and congested cough. SLP recommended the pt remain NPO at this time, and spent time educating son on the importance of not giving any liquids/ice chips to the pt at this time as she is at increased risk of aspiration. Son educated on facilitating oral toothete swab in order to moisten pt's oral cavity as needed/requested. SLP to follow-up with pt next date for improved orofacial swelling. Decreased swelling will allow  for improved PO readiness and possible need for further instrumental testing. ?SLP Visit Diagnosis: Dysphagia, unspecified (R13.10) ?   ?Aspiration Risk ? Severe aspiration risk  ?  ?Diet Recommendation NPO  ? ?Medication Administration: Via alternative means  ?  ?Other  Recommendations Oral Care Recommendations: Oral care QID   ? ?Recommendations for follow up therapy are one component of a multi-disciplinary discharge planning process, led by the attending physician.  Recommendations may be updated based on patient status, additional functional criteria and  insurance authorization. ? ?Follow up Recommendations Other (comment) (TBD)  ? ? ?  ?Assistance Recommended at Discharge Set up Supervision/Assistance  ?Functional Status Assessment Patient has had a recent decline in their functional status and demonstrates the ability to make significant improvements in function in a reasonable and predictable amount of time.  ?Frequency and Duration min 2x/week  ?2 weeks ?  ?   ? ?Prognosis Prognosis for Safe Diet Advancement: Good  ? ?  ? ?Swallow Study   ?General HPI: 81 y.o female with a PMH of complete heart block, HTN, Afib on eliquis presents to the ED via ambulance with a chief complaint of right arm swelling and left eye swollen x a few days. Pt failed [facility] swallow screen in ED. Has had recent admission, but no prior SLP visits. ?Type of Study: Bedside Swallow Evaluation ?Diet Prior to this Study: Regular;Thin liquids ?Temperature Spikes Noted: No ?Respiratory Status: Room air ?History of Recent Intubation: No ?Behavior/Cognition: Alert;Cooperative ?Oral Cavity Assessment: Edema ?Oral Care Completed by SLP: No ?Oral Cavity - Dentition: Missing dentition ?Vision: Impaired for self-feeding ?Self-Feeding Abilities: Total assist ?Patient Positioning: Upright in bed ?Baseline Vocal Quality: Other (comment) (Hypernasal) ?Volitional Cough: Weak  ?  ?Oral/Motor/Sensory Function Overall Oral Motor/Sensory Function: Within functional limits   ?Ice Chips Ice chips: Impaired ?Presentation: Spoon ?Oral Phase Impairments: Impaired mastication ?Oral Phase Functional Implications: Prolonged oral transit ?Pharyngeal Phase Impairments: Wet Vocal Quality;Cough - Immediate   ?Thin Liquid Thin Liquid: Not tested  ?  ?Nectar Thick Nectar Thick Liquid: Not tested   ?Honey Thick Honey Thick Liquid: Not tested   ?Puree Puree: Not tested   ?Solid ? ? ?  Solid: Not tested  ? ?  ? ?Vaughan Sine ?03/13/2022,2:34 PM ? ? ? ? ?

## 2022-03-13 NOTE — ED Provider Notes (Signed)
?[facility] ?Provider Note ? ? ?CSN: [id number] ?Arrival date & time: 03/13/22  [id number] ? ?  ? ?History ?Pacemaker in place ?Chief Complaint  ?Patient presents with  ? Facial Swelling  ? ? ?Lauren Henry is a 81 y.o. female. ? ?81 y.o female with a PMH of complete heart block, HTN, Afib on eliquis presents to the ED via ambulance with a chief complaint of right arm swelling and left eye swollen x a few days. History is very limited from patient as she is unsure on why she is here today. The left eye began swelling 2 days ago, continued to worsen last night.  She reports decrease in vision due to being so swollen however is able to range her eye from left to right.  She also endorses swelling along her right hand, making this difficult to flex.  She does start 2 new medications but according to patient they were only vitamins.  According to her records she was started on thiamine, vitamin B-12. She was recently hospitalized 5 days ago due to ongoing weakness.  She denies any further complaint at this time. ? ?The history is provided by the patient.  ? ?  ? ?Home Medications ?Prior to Admission medications   ?Medication Sig Start Date End Date Taking? Authorizing Provider  ?apixaban (ELIQUIS) 2.5 MG TABS tablet Take 1 tablet (2.5 mg total) by mouth 2 (two) times daily. 02/18/22  Yes Little Ishikawa, MD  ?Cholecalciferol (VITAMIN D3) 50 MCG (2000 UT) TABS Take 2,000 Units by mouth every morning.   Yes [provider]  ?dorzolamide (TRUSOPT) 2 % ophthalmic solution Place 1 drop into the left eye 2 (two) times daily. 11/25/21  Yes [provider]  ?ferrous sulfate 325 (65 FE) MG tablet Take 325 mg by mouth daily with breakfast.   Yes [provider]  ?ipratropium (ATROVENT) 0.03 % nasal spray Place 2 sprays into both nostrils 2 (two) times daily as needed for rhinitis. 02/05/22  Yes [provider]  ?levothyroxine (SYNTHROID, LEVOTHROID) 150 MCG  tablet Take 150-225 mcg by mouth See admin instructions. Take one tablet (150 mcg) by mouth daily before breakfast, on 2 days of the week take an additional 1/2 tablet (75 mg) for a total dose on those days of 225 mcg 10/15/17  Yes [provider]  ?metoprolol tartrate (LOPRESSOR) 25 MG tablet Take 0.5 tablets (12.5 mg total) by mouth 2 (two) times daily. 02/23/22  Yes Ghimire, Henreitta Leber, MD  ?mirtazapine (REMERON) 7.5 MG tablet Take 7.5 mg by mouth at bedtime.  11/03/18  Yes [provider]  ?Multiple Vitamin (MULTIVITAMIN WITH MINERALS) TABS tablet Take 1 tablet by mouth every morning.   Yes [provider]  ?ondansetron (ZOFRAN) 4 MG tablet Take 4 mg by mouth every 8 (eight) hours as needed for nausea or vomiting. 02/05/22  Yes [provider]  ?pantoprazole (PROTONIX) 40 MG tablet Take 40 mg by mouth every morning. 11/10/18  Yes [provider]  ?thiamine 100 MG tablet Take 1 tablet (100 mg total) by mouth daily. 03/10/22 04/09/22 Yes Pahwani, Einar Grad, MD  ?vitamin B-12 (CYANOCOBALAMIN) 500 MCG tablet Take 1 tablet (500 mcg total) by mouth daily. 03/09/22 04/08/22 Yes Pahwani, Einar Grad, MD  ?vitamin C (ASCORBIC ACID) 250 MG tablet Take 250 mg by mouth every morning.   Yes [provider]  ?rosuvastatin (CRESTOR) 40 MG tablet TAKE 1 TABLET BY MOUTH DAILY ?Patient not taking: Reported on 03/13/2022 02/10/22  Skeet Latch, MD  ?   ? ?Allergies    ?Methimazole, Olmesartan, and Amlodipine   ? ?Review of Systems   ?Review of Systems  ?Constitutional:  Negative for chills and fever.  ?HENT:  Positive for facial swelling. Negative for sore throat.   ?Respiratory:  Negative for shortness of breath.   ?Cardiovascular:  Negative for chest pain.  ?Gastrointestinal:  Negative for abdominal pain, nausea and vomiting.  ?Genitourinary:  Negative for flank pain.  ?Musculoskeletal:  Negative for back pain.  ?Skin:  Negative for pallor and wound.  ?Neurological:  Positive for tremors and  weakness. Negative for light-headedness and headaches.  ?All other systems reviewed and are negative. ? ?Physical Exam ?Updated Vital Signs ?BP 112/83   Pulse 99   Temp (!) 96.7 ?F (35.9 ?C) (Oral)   Resp 13   Ht 5\' 5"  (1.651 m)   Wt 76.7 kg   SpO2 100%   BMI 28.14 kg/m?  ?Physical Exam ?Vitals and nursing note reviewed.  ?Constitutional:   ?   Appearance: Normal appearance.  ?HENT:  ?   Head: Normocephalic and atraumatic.  ?   Nose: Nose normal.  ?   Mouth/Throat:  ?   Mouth: Mucous membranes are moist.  ?Eyes:  ?   Extraocular Movements: Extraocular movements intact.  ?   Right eye: Normal extraocular motion.  ?   Left eye: Normal extraocular motion.  ?   Conjunctiva/sclera:  ?   Left eye: Left conjunctiva is injected. Chemosis present.  ?   Comments: Periorbital swelling to the LEFT eye with intact EOMs. Please see photos attached.   ?Cardiovascular:  ?   Rate and Rhythm: Rhythm irregular.  ?Pulmonary:  ?   Effort: Pulmonary effort is normal.  ?   Breath sounds: No wheezing.  ?Abdominal:  ?   General: Abdomen is flat.  ?   Palpations: Abdomen is soft.  ?   Tenderness: There is no abdominal tenderness.  ?Musculoskeletal:  ?   Cervical back: Normal range of motion and neck supple.  ?Skin: ?   General: Skin is warm and dry.  ?   Findings: Rash present.  ?   Comments: Scaling erythematous rash to her forehead.   ?Neurological:  ?   Mental Status: She is alert. Mental status is at baseline.  ?   Cranial Nerves: No dysarthria or facial asymmetry.  ?   Motor: Tremor present.  ? ? ? ? ? ? ? ?ED Results / Procedures / Treatments   ?Labs ?(all labs ordered are listed, but only abnormal results are displayed) ?Labs Reviewed  ?CBC WITH DIFFERENTIAL/PLATELET - Abnormal; Notable for the following components:  ?    Result Value  ? RBC 3.15 (*)   ? Hemoglobin 9.2 (*)   ? HCT 28.5 (*)   ? Lymphs Abs 0.4 (*)   ? All other components within normal limits  ?COMPREHENSIVE METABOLIC PANEL - Abnormal; Notable for the following  components:  ? Glucose, Bld 258 (*)   ? BUN 44 (*)   ? Creatinine, Ser 2.05 (*)   ? Total Protein 6.1 (*)   ? Albumin 2.1 (*)   ? AST 57 (*)   ? ALT 75 (*)   ? GFR, Estimated 24 (*)   ? All other components within normal limits  ?PROTIME-INR - Abnormal; Notable for the following components:  ? Prothrombin Time 20.3 (*)   ? INR 1.7 (*)   ? All other components within normal limits  ?BRAIN NATRIURETIC  PEPTIDE - Abnormal; Notable for the following components:  ? B Natriuretic Peptide 551.8 (*)   ? All other components within normal limits  ?TSH  ?URINALYSIS, ROUTINE W REFLEX MICROSCOPIC  ? ? ?EKG ?EKG Interpretation ? ?Date/Time:  Thursday March 13 2022 09:10:29 EDT ?Ventricular Rate:  60 ?PR Interval:  67 ?QRS Duration: 151 ?QT Interval:  454 ?QTC Calculation: [id number] ?R Axis:   -86 ?Text Interpretation: AV PACED RHYTHM Confirmed by Lajean Saver [phone]) on 03/13/2022 9:11:52 AM ? ?Radiology ?DG Chest Portable 1 View ? ?Result Date: 03/13/2022 ?CLINICAL DATA:  Weakness EXAM: PORTABLE CHEST 1 VIEW COMPARISON:  Chest x-ray dated March 07, 2022 FINDINGS: Cardiac and mediastinal contours are within normal limits. Left chest wall dual lead pacer with unchanged lead position. Mild bibasilar atelectasis. Lungs otherwise clear. No large pleural effusion or evidence of pneumothorax. IMPRESSION: No active disease. Electronically Signed   By: Yetta Glassman M.D.   On: 03/13/2022 10:02   ? ?Procedures ?Procedures  ? ? ?Medications Ordered in ED ?Medications  ?sodium chloride 0.9 % bolus 500 mL (has no administration in time range)  ? ? ?ED Course/ Medical Decision Making/ A&P ?  ?                        ?Medical Decision Making ?Amount and/or Complexity of Data Reviewed ?Labs: ordered. ?Radiology: ordered. ? ? ?This patient presents to the ED for concern of LEFT eye swelling, RIGHT hand swelling, this involves a number of treatment options, and is a complaint that carries with it a high risk of complications and morbidity.  The  differential diagnosis includes  ? ? ?Co morbidities: ?Discussed in HPI ? ? ?Brief History: ? ?Patient here for left thigh swelling, right hand swelling.  Patient's third visit in the month of March, previously hos

## 2022-03-13 NOTE — ED Triage Notes (Signed)
Pt presents to ED for failure to thrive per husband, reports pt has not been taking home medication. Per husband, pt has had some slurred speech at the beginning of the week along with swelling (right arm, left eye). Pt has hives on face, denies any trouble breathing and has no complaints other than right arm pain. VSS en route.  ?

## 2022-03-13 NOTE — Assessment & Plan Note (Addendum)
Acute.  Patient has had progressive swelling of the right upper extremity as well as neck.  Unclear cause of symptoms at this point in time. ?-Check vascular duplex ultrasound of the right upper extremity ?-Check CT scan of the soft tissues of the neck ?

## 2022-03-13 NOTE — Assessment & Plan Note (Addendum)
Patient has had difficulty swallowing for which she has been on a clear liquid diet, but husband reports that she still sometimes intermittently getting choked up.  He reports over the last week her speech has been more slurred. ?-Neuro check ?-Aspiration precautions with elevation of head of the bed ?-Clear liquid diet for now ?-Speech therapy consult ? ?

## 2022-03-13 NOTE — Assessment & Plan Note (Signed)
Hemoglobin 9.2 g/dL which appears around patient's recent baseline.  No reports of bleeding. ?-Continue to monitor ?

## 2022-03-13 NOTE — Assessment & Plan Note (Addendum)
Patient kidney function noted to be 2.05 with BUN 44.  Baseline creatinine previously noted to be 1.4-1.6.  Patient has been given 500 mL of normal saline IV fluid.  Of note she does have a history of nephrolithiasis.  Elevated BUN to creatinine ratio suggest prerenal cause.  ?-Check urinalysis ?-Check urine sodium and urine creatinine ?-Continue to monitor kidney function daily ? ?

## 2022-03-13 NOTE — Assessment & Plan Note (Addendum)
Acute.  Rash started yesterday after patient's office visit cardiology.  Husband notes that the rash is across the patient's forehead and appears to be on her chest as well.  RPR was nonreactive 03/08/2022.  Does not necessarily look like herpes zoster and is not painful.  Question of possibility of allergic reaction, but the only recent medication was vitamin B12 and B1. ?-Check ESR and CRP ?-Hydrocortisone cream to rash ?

## 2022-03-13 NOTE — Progress Notes (Signed)
Orders for Creatinine and Sodium random urine placed at 1442 today. Pt transported to unit 1712. Pt has not provided sample. On coming nurse notified of situation. ?

## 2022-03-13 NOTE — ED Notes (Signed)
Pt removed from bed pan. Attempt for urine sample was made. Pt says they felt an urge though unable to urinate.  ? ?Pt repositioned and comfortable.  ?

## 2022-03-13 NOTE — Assessment & Plan Note (Addendum)
Patient does not appear grossly fluid overloaded.  Last echocardiogram from 02/22/2022 noted EF of 55-60% with grade 1 diastolic dysfunction.  BNP was elevated at 551.8 today, but was only 212.5 on 3/17. ?-Strict I&Os and daily weights ?

## 2022-03-13 NOTE — H&P (Addendum)
?History and Physical  ? ? ?Patient: Lauren Henry [id number] DOB: December 06, 1941 ?DOA: 03/13/2022 ?DOS: the patient was seen and examined on 03/13/2022 ?PCP: Haywood Pao, MD  ?Patient coming from: Home ? ?Chief Complaint:  ?Chief Complaint  ?Patient presents with  ? Facial Swelling  ? ?HPI: Lauren Henry is a 81 y.o. 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  History is obtained from the patient's husband who is present at bedside.  ? ? She had just recently been hospitalized 3/2 -3/5 after having a syncopal episode in setting of UTI where she was taken off of blood pressure medications and treated with antibiotics.  Then hospitalized 3/17-3/19 for failure to thrive symptoms.  Her husband reports progressive decline with at least a 30 pound weight loss since Christmas.  There was concern for possible underlying malignancy for which CT scan of the chest, abdomen, and pelvis was obtained, but did not show any acute cause for symptoms.  Scans noted an 8 mm noncalcified nodule of the right lower lobe for which repeat CT scan recommended in 2 to 3 months.  She was also noted to have new tremor that was not thought to be parkinsonian in nature with mildly elevated free T4, but levothyroxine dose was deferred to PCP.  Of note patient had been placed on prednisone for short period in time to try and help with appetite stimulation.  The last week patient husband reports that her speech has become more slurred.  She has also had neck swelling with difficulty swallowing and intermittently coughing while eating.  In addition to the neck swelling he also had reported right arm swelling that is unresolved for she has been  recently on a liquid diet, but is not able to keep up with her nutrition requirements.  She had been seen by cardiology yesterday and had been recommended to start on Crestor, but had not started the medication as of yet.  Husband notes immediately following her appointment the patient broke out in a rash across her forehead and has had progressively worsening swelling of the left eye.  Patient denies any pain related to the rash.    Note patient states that she has received her shingles vaccine.  This morning he called 911 after the patient seemed to be having difficulty breathing. ? ?In the ED patient was noted to be afebrile with vital signs relatively stable.  Labs significant for hemoglobin 9.2, BUN 44, creatinine 2.05, glucose 258, AST 57, ALT 75, TSH 3.233 and BNP 551.8.  Chest x-ray showed no acute disease.  Patient was given normal saline IV fluids 500 mL. ? ?Review of Systems: unable to review all systems due to the inability of the patient to answer questions. ?Past Medical History:  ?Diagnosis Date  ? Arthritis   ? Atrial fibrillation ([place])   ? Diarrhea   ? since gallbladder removed  ? Dysrhythmia   ? 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 History of blood transfusion as a child  ? no abnormal reaction noted  ? History of colon polyps   ? Hyperlipidemia   ? takes Pravastatin daily  ? Hypertension   ? takes Azor and Metoprolol daily  ? Hypothyroidism   ? takes Synthroid daily  ?  Joint pain   ? Presence of permanent cardiac pacemaker   ? Urinary frequency   ? Urinary urgency   ? ?Past Surgical History:  ?Procedure Laterality Date  ? ABDOMINAL HYSTERECTOMY    ? cataract surgery Bilateral   ? CHOLECYSTECTOMY  1971  ? COLONOSCOPY    ? EYE SURGERY    ? INSERT / REPLACE / REMOVE PACEMAKER    ? Medtronic PPM 11/01/14 (Dr. Cristopher Peru)  ? JOINT REPLACEMENT Right   ? knee  ? LEFT HEART CATH AND CORONARY ANGIOGRAPHY N/A 11/01/2018  ? Procedure: LEFT HEART CATH AND CORONARY  ANGIOGRAPHY;  Surgeon: Nelva Bush, MD;  Location: [facility];  Service: Cardiovascular;  Laterality: N/A;  ? PERMANENT PACEMAKER INSERTION N/A 11/01/2014  ? Procedure: PERMANENT PACEMAKER INSERTION;  Surgeon: Evans Lance, MD;  Location: [facility] C[facility];  Service: Cardiovascular;  Laterality: N/A;  ? stomach stapled  1985  ? TOTAL KNEE ARTHROPLASTY Left 11/05/2015  ? Procedure: TOTAL KNEE ARTHROPLASTY;  Surgeon: Vickey Huger, MD;  Location: [place];  Service: Orthopedics;  Laterality: Left;  ? ?Social History:  reports that she has never smoked. She has never used smokeless tobacco. She reports current alcohol use of about 1.0 standard drink per week. She reports that she does not use drugs. ? ?Allergies  ?Allergen Reactions  ? Methimazole Other (See Comments)  ?  Elevated LFT's per [facility]   ? Olmesartan Other (See Comments)  ?  enteritis  ? Amlodipine Swelling and Rash  ? ? ?Family History  ?Problem Relation Age of Onset  ? Heart disease Father   ? Heart attack Father   ? Stroke Sister   ? ? ?Prior to Admission medications   ?Medication Sig Start Date End Date Taking? Authorizing Provider  ?apixaban (ELIQUIS) 2.5 MG TABS tablet Take 1 tablet (2.5 mg total) by mouth 2 (two) times daily. 02/18/22  Yes Little Ishikawa, MD  ?Cholecalciferol (VITAMIN D3) 50 MCG (2000 UT) TABS Take 2,000 Units by mouth every morning.   Yes [provider]  ?dorzolamide (TRUSOPT) 2 % ophthalmic solution Place 1 drop into the left eye 2 (two) times daily. 11/25/21  Yes [provider]  ?ferrous sulfate 325 (65 FE) MG tablet Take 325 mg by mouth daily with breakfast.   Yes [provider]  ?ipratropium (ATROVENT) 0.03 % nasal spray Place 2 sprays into both nostrils 2 (two) times daily as needed for rhinitis. 02/05/22  Yes [provider]  ?levothyroxine (SYNTHROID, LEVOTHROID) 150 MCG tablet Take 150-225 mcg by mouth See admin instructions. Take one tablet (150 mcg) by mouth daily  before breakfast, on 2 days of the week take an additional 1/2 tablet (75 mg) for a total dose on those days of 225 mcg 10/15/17  Yes [provider]  ?metoprolol tartrate (LOPRESSOR) 25 MG tablet Take 0.5 tablets (12.5 mg total) by mouth 2 (two) times daily. 02/23/22  Yes Ghimire, Henreitta Leber, MD  ?mirtazapine (REMERON) 7.5 MG tablet Take 7.5 mg by mouth at bedtime.  11/03/18  Yes [provider]  ?Multiple Vitamin (MULTIVITAMIN WITH MINERALS) TABS tablet Take 1 tablet by mouth every morning.   Yes [provider]  ?ondansetron (ZOFRAN) 4 MG tablet Take 4 mg by mouth every 8 (eight) hours as needed for nausea or vomiting. 02/05/22  Yes [provider]  ?pantoprazole (PROTONIX) 40 MG tablet Take 40 mg by mouth every morning. 11/10/18  Yes [provider]  ?thiamine 100 MG tablet Take  1 tablet (100 mg total) by mouth daily. 03/10/22 04/09/22 Yes Pahwani, Einar Grad, MD  ?vitamin B-12 (CYANOCOBALAMIN) 500 MCG tablet Take 1 tablet (500 mcg total) by mouth daily. 03/09/22 04/08/22 Yes Pahwani, Einar Grad, MD  ?vitamin C (ASCORBIC ACID) 250 MG tablet Take 250 mg by mouth every morning.   Yes [provider]  ?rosuvastatin (CRESTOR) 40 MG tablet TAKE 1 TABLET BY MOUTH DAILY ?Patient not taking: Reported on 03/13/2022 02/10/22   Skeet Latch, MD  ? ? ?Physical Exam: ?Vitals:  ? 03/13/22 0857 03/13/22 0900 03/13/22 0915 03/13/22 1045  ?BP: 118/73  121/81 112/83  ?Pulse: 60  60 99  ?Resp: 18  16 13   ?Temp:  (!) 96.7 ?F (35.9 ?C)    ?TempSrc:  Oral    ?SpO2: 100%  100% 100%  ?Weight:  76.7 kg    ?Height:  5\' 5"  (1.651 m)    ? ?Constitutional: Elderly female who appears ill ?Eyes: Swelling of the left eyelid with conjunctival irritation present and drainage. ?ENMT: Mucous membranes are moist. Posterior pharynx clear of any exudate or lesions.  ?Neck: Neck swelling appreciated with tenderness palpation. ?Respiratory: clear to auscultation bilaterally, no wheezing, no crackles.    ?Cardiovascular: Paced rhythm swelling of the right upper extremity.   ?Abdomen: no tenderness, no masses palpated. No hepatosplenomegaly. Bowel sounds positive.  ?Musculoskeletal: no clubbing / cyanosis. No joint deformity

## 2022-03-13 NOTE — ED Notes (Signed)
Swallow evaluation performed on pt. Pt began coughing after small sip. PO medication to be held at this time. ?

## 2022-03-13 NOTE — Assessment & Plan Note (Addendum)
Patient is currently in a paced rhythm. ?-Continue Eliquis ?

## 2022-03-13 NOTE — Consult Note (Addendum)
Neurology Consultation ?Reason for Consult: Slurred speech, cognitive decline, ?Requesting Physician: Fuller Plan ? ?CC: Rash and difficulty breathing and swallowing ? ?History is obtained from: Patient, chart review and husband ? ?HPI: Lauren Henry is a 81 y.o. female with a past medical history significant for complete heart block s/p pacer (MRI incompatible), paroxysmal atrial fibrillation on Eliquis, hypertension, hyperlipidemia, heart failure with preserved EF, CKD stage IIIb, hypothyroidism, recent UTI (3/05) ? ?She was hospitalized from 2/27-2/28 for dizziness and weakness in the setting of weeks of poor oral intake.  She was discharged with plan for close primary care physician follow-up after improving with IV fluids and supportive care.  She was then readmitted on 3/2 for syncope in the setting of UTI and AKI, discharged 3/5 after treatment with IV Rocephin, transition to [facility] on discharge.  She continued to have very poor oral intake, and was started on prednisone for appetite, with the only reason she could offer for not eating being not liking the food.  Per husband, she took 4 pills a day for 5 days, which did improve her functional status as well as her appetite, but then she experienced a crash after stopping the prednisone on 3/15.  She was readmitted 3/17 - 3/19 for failure to thrive.  CT chest showed 8 mm noncalcified nodule in the right lower lobe with suggestion for repeat CT in 2 to 3 months.  Neurology evaluation noted bilateral lower extremity weakness and hyperreflexia, for which CT myelogram was recommended due to MRI incompatibility of her pacemaker.  Family deferred CT myelogram.  Nutritional testing was also completed and revealed a normal copper level, normal thiamine and B12 as well as MMA, normal TSH, vitamin E remains in process. ? ?She has had some mild confusion such as getting her days and nights mixed up.  Has been feels her slurred speech has been progressive over 1 week.   He notes no new medications other than B12 and thiamine which she took her first dose 3/22 in the morning.  She did take her apixaban as scheduled 3/22 in the evening.  They deny any missed doses of medications.  She has had some episodes of urinary incontinence but no bowel incontinence to their knowledge, however she did have an episode of bowel incontinence during my examination.  They note that she walked around more on 3/22 than she had in many days, and that her strength was actually quite good and improving compared to her hospitalization ? ?Weight: ?84.5 kg on 08/28/2020 ?Wt 189 lb 6.4 oz (85.9 kg) on 11/25/2021 ?03/12/22 169 lb (76.7 kg)  ?03/09/22 171 lb 11.8 oz (77.9 kg)  ?02/21/22 171 lb 15.3 oz (78 kg)  ? ? ?ROS: All other review of systems was negative except as noted in the HPI.   ? ?Past Medical History:  ?Diagnosis Date  ? Arthritis   ? Atrial fibrillation ([place])   ? Diarrhea   ? since gallbladder removed  ? Dysrhythmia   ? 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 History of blood transfusion as a child  ? no abnormal reaction noted  ? History of colon polyps   ? Hyperlipidemia   ? takes Pravastatin daily  ? Hypertension   ? takes Azor and Metoprolol daily  ? Hypothyroidism   ? takes Synthroid daily  ? Joint pain   ? Presence of permanent cardiac pacemaker   ? Urinary frequency   ? Urinary urgency   ? ?  Past Surgical History:  ?Procedure Laterality Date  ? ABDOMINAL HYSTERECTOMY    ? cataract surgery Bilateral   ? CHOLECYSTECTOMY  1971  ? COLONOSCOPY    ? EYE SURGERY    ? INSERT / REPLACE / REMOVE PACEMAKER    ? Medtronic PPM 11/01/14 (Dr. Cristopher Peru)  ? JOINT REPLACEMENT Right   ? knee  ? LEFT HEART CATH AND CORONARY ANGIOGRAPHY N/A 11/01/2018  ? Procedure: LEFT HEART CATH AND CORONARY ANGIOGRAPHY;  Surgeon: Nelva Bush, MD;  Location: [place] [facility];  Service: Cardiovascular;  Laterality: N/A;  ? PERMANENT PACEMAKER INSERTION N/A 11/01/2014  ? Procedure:  PERMANENT PACEMAKER INSERTION;  Surgeon: Evans Lance, MD;  Location: [facility] CATH LAB;  Service: Cardiovascular;  Laterality: N/A;  ? stomach stapled  1985  ? TOTAL KNEE ARTHROPLASTY Left 11/05/2015  ? Procedure: TOTAL KNEE ARTHROPLASTY;  Surgeon: Vickey Huger, MD;  Location: [place];  Service: Orthopedics;  Laterality: Left;  ? ? ?Scheduled Meds: ? apixaban  2.5 mg Oral BID  ? [START ON 03/14/2022] levothyroxine  150 mcg Oral Once per day on Sun Mon Wed Thu Fri  ? [START ON 03/15/2022] levothyroxine  225 mcg Oral Once per day on Tue Sat  ? metoprolol tartrate  12.5 mg Oral BID  ? mirtazapine  7.5 mg Oral QHS  ? sodium chloride flush  3 mL Intravenous Q12H  ? ? ?Current Facility-Administered Medications:  ?  acetaminophen (TYLENOL) tablet 650 mg, 650 mg, Oral, Q6H PRN **OR** acetaminophen (TYLENOL) suppository 650 mg, 650 mg, Rectal, Q6H PRN, Tamala Julian, Rondell A, MD ?  albuterol (PROVENTIL) (2.5 MG/3ML) 0.083% nebulizer solution 2.5 mg, 2.5 mg, Nebulization, Q6H PRN, Norval Morton, MD ?  apixaban (ELIQUIS) tablet 2.5 mg, 2.5 mg, Oral, BID, Norval Morton, MD ?  Derrill Memo ON 03/14/2022] levothyroxine (SYNTHROID) tablet 150 mcg, 150 mcg, Oral, Once per day on Sun Mon Wed Thu Fri, Smith, Delbert Phenix, MD ?  Derrill Memo ON 03/15/2022] levothyroxine (SYNTHROID) tablet 225 mcg, 225 mcg, Oral, Once per day on Tue Sat, Smith, Rondell A, MD ?  metoprolol tartrate (LOPRESSOR) tablet 12.5 mg, 12.5 mg, Oral, BID, Smith, Rondell A, MD ?  mirtazapine (REMERON) tablet 7.5 mg, 7.5 mg, Oral, QHS, Smith, Rondell A, MD ?  sodium chloride flush (NS) 0.9 % injection 3 mL, 3 mL, Intravenous, Q12H, Smith, Rondell A, MD, 3 mL at 03/13/22 1248 ? ?Current Outpatient Medications:  ?  apixaban (ELIQUIS) 2.5 MG TABS tablet, Take 1 tablet (2.5 mg total) by mouth 2 (two) times daily., Disp: 60 tablet, Rfl: 0 ?  Cholecalciferol (VITAMIN D3) 50 MCG (2000 UT) TABS, Take 2,000 Units by mouth every morning., Disp: , Rfl:  ?  dorzolamide (TRUSOPT) 2 % ophthalmic  solution, Place 1 drop into the left eye 2 (two) times daily., Disp: , Rfl:  ?  ferrous sulfate 325 (65 FE) MG tablet, Take 325 mg by mouth daily with breakfast., Disp: , Rfl:  ?  ipratropium (ATROVENT) 0.03 % nasal spray, Place 2 sprays into both nostrils 2 (two) times daily as needed for rhinitis., Disp: , Rfl:  ?  levothyroxine (SYNTHROID, LEVOTHROID) 150 MCG tablet, Take 150-225 mcg by mouth See admin instructions. Take one tablet (150 mcg) by mouth daily before breakfast, on 2 days of the week take an additional 1/2 tablet (75 mg) for a total dose on those days of 225 mcg, Disp: , Rfl: 0 ?  metoprolol tartrate (LOPRESSOR) 25 MG tablet, Take 0.5 tablets (12.5 mg total) by  mouth 2 (two) times daily., Disp: 60 tablet, Rfl: 3 ?  mirtazapine (REMERON) 7.5 MG tablet, Take 7.5 mg by mouth at bedtime. , Disp: , Rfl:  ?  Multiple Vitamin (MULTIVITAMIN WITH MINERALS) TABS tablet, Take 1 tablet by mouth every morning., Disp: , Rfl:  ?  ondansetron (ZOFRAN) 4 MG tablet, Take 4 mg by mouth every 8 (eight) hours as needed for nausea or vomiting., Disp: , Rfl:  ?  pantoprazole (PROTONIX) 40 MG tablet, Take 40 mg by mouth every morning., Disp: , Rfl: 3 ?  thiamine 100 MG tablet, Take 1 tablet (100 mg total) by mouth daily., Disp: 30 tablet, Rfl: 0 ?  vitamin B-12 (CYANOCOBALAMIN) 500 MCG tablet, Take 1 tablet (500 mcg total) by mouth daily., Disp: 30 tablet, Rfl: 0 ?  vitamin C (ASCORBIC ACID) 250 MG tablet, Take 250 mg by mouth every morning., Disp: , Rfl:  ?  rosuvastatin (CRESTOR) 40 MG tablet, TAKE 1 TABLET BY MOUTH DAILY (Patient not taking: Reported on 03/13/2022), Disp: 90 tablet, Rfl: 2 ? ?Continuous Infusions: ?PRN Meds:.acetaminophen **OR** acetaminophen, albuterol ? ? ?Family History  ?Problem Relation Age of Onset  ? Heart disease Father   ? Heart attack Father   ? Stroke Sister   ? ? ?Social History:  reports that she has never smoked. She has never used smokeless tobacco. She reports current alcohol use of about  1.0 standard drink per week. She reports that she does not use drugs. ? ? ?Exam: ?Current vital signs: ?BP (!) 113/91   Pulse 100   Temp (!) 96.7 ?F (35.9 ?C) (Oral)   Resp 14   Ht 5\' 5"  (1.651 m)   Wt 76.7 kg

## 2022-03-13 NOTE — Assessment & Plan Note (Addendum)
Free T4- 2.13 and TSH -2.389 six days ago.  Repeat TSH 3.233 today.  It was recommended patient follow-up with her provider in regards to adjustment of levothyroxine dosage. ?-Continued current regimen ?

## 2022-03-14 ENCOUNTER — Observation Stay (HOSPITAL_COMMUNITY): Payer: Medicare PPO

## 2022-03-14 ENCOUNTER — Inpatient Hospital Stay (HOSPITAL_COMMUNITY): Payer: Medicare PPO

## 2022-03-14 DIAGNOSIS — R7989 Other specified abnormal findings of blood chemistry: Secondary | ICD-10-CM | POA: Diagnosis present

## 2022-03-14 DIAGNOSIS — E162 Hypoglycemia, unspecified: Secondary | ICD-10-CM | POA: Diagnosis not present

## 2022-03-14 DIAGNOSIS — D61818 Other pancytopenia: Secondary | ICD-10-CM | POA: Diagnosis present

## 2022-03-14 DIAGNOSIS — I442 Atrioventricular block, complete: Secondary | ICD-10-CM | POA: Diagnosis present

## 2022-03-14 DIAGNOSIS — N179 Acute kidney failure, unspecified: Secondary | ICD-10-CM | POA: Diagnosis present

## 2022-03-14 DIAGNOSIS — N189 Chronic kidney disease, unspecified: Secondary | ICD-10-CM | POA: Diagnosis not present

## 2022-03-14 DIAGNOSIS — T783XXA Angioneurotic edema, initial encounter: Secondary | ICD-10-CM

## 2022-03-14 DIAGNOSIS — E039 Hypothyroidism, unspecified: Secondary | ICD-10-CM | POA: Diagnosis present

## 2022-03-14 DIAGNOSIS — I480 Paroxysmal atrial fibrillation: Secondary | ICD-10-CM | POA: Diagnosis present

## 2022-03-14 DIAGNOSIS — E43 Unspecified severe protein-calorie malnutrition: Secondary | ICD-10-CM | POA: Diagnosis present

## 2022-03-14 DIAGNOSIS — E669 Obesity, unspecified: Secondary | ICD-10-CM | POA: Diagnosis present

## 2022-03-14 DIAGNOSIS — Z515 Encounter for palliative care: Secondary | ICD-10-CM | POA: Diagnosis not present

## 2022-03-14 DIAGNOSIS — Z23 Encounter for immunization: Secondary | ICD-10-CM | POA: Diagnosis not present

## 2022-03-14 DIAGNOSIS — I5032 Chronic diastolic (congestive) heart failure: Secondary | ICD-10-CM | POA: Diagnosis present

## 2022-03-14 DIAGNOSIS — R4182 Altered mental status, unspecified: Secondary | ICD-10-CM | POA: Diagnosis not present

## 2022-03-14 DIAGNOSIS — D631 Anemia in chronic kidney disease: Secondary | ICD-10-CM | POA: Diagnosis present

## 2022-03-14 DIAGNOSIS — Z7189 Other specified counseling: Secondary | ICD-10-CM | POA: Diagnosis not present

## 2022-03-14 DIAGNOSIS — I130 Hypertensive heart and chronic kidney disease with heart failure and stage 1 through stage 4 chronic kidney disease, or unspecified chronic kidney disease: Secondary | ICD-10-CM | POA: Diagnosis present

## 2022-03-14 DIAGNOSIS — Z66 Do not resuscitate: Secondary | ICD-10-CM | POA: Diagnosis present

## 2022-03-14 DIAGNOSIS — E785 Hyperlipidemia, unspecified: Secondary | ICD-10-CM | POA: Diagnosis present

## 2022-03-14 DIAGNOSIS — R627 Adult failure to thrive: Secondary | ICD-10-CM | POA: Diagnosis not present

## 2022-03-14 DIAGNOSIS — J690 Pneumonitis due to inhalation of food and vomit: Secondary | ICD-10-CM | POA: Diagnosis present

## 2022-03-14 DIAGNOSIS — H1132 Conjunctival hemorrhage, left eye: Secondary | ICD-10-CM | POA: Diagnosis present

## 2022-03-14 DIAGNOSIS — R609 Edema, unspecified: Secondary | ICD-10-CM | POA: Diagnosis not present

## 2022-03-14 DIAGNOSIS — R410 Disorientation, unspecified: Secondary | ICD-10-CM | POA: Diagnosis not present

## 2022-03-14 DIAGNOSIS — B961 Klebsiella pneumoniae [K. pneumoniae] as the cause of diseases classified elsewhere: Secondary | ICD-10-CM | POA: Diagnosis present

## 2022-03-14 DIAGNOSIS — G9341 Metabolic encephalopathy: Secondary | ICD-10-CM | POA: Diagnosis present

## 2022-03-14 DIAGNOSIS — E872 Acidosis, unspecified: Secondary | ICD-10-CM | POA: Diagnosis present

## 2022-03-14 DIAGNOSIS — D692 Other nonthrombocytopenic purpura: Secondary | ICD-10-CM | POA: Diagnosis not present

## 2022-03-14 DIAGNOSIS — N1832 Chronic kidney disease, stage 3b: Secondary | ICD-10-CM | POA: Diagnosis present

## 2022-03-14 DIAGNOSIS — J9601 Acute respiratory failure with hypoxia: Secondary | ICD-10-CM | POA: Diagnosis not present

## 2022-03-14 DIAGNOSIS — H5789 Other specified disorders of eye and adnexa: Secondary | ICD-10-CM | POA: Diagnosis not present

## 2022-03-14 DIAGNOSIS — J9602 Acute respiratory failure with hypercapnia: Secondary | ICD-10-CM | POA: Diagnosis not present

## 2022-03-14 LAB — CBC
HCT: 29 % — ABNORMAL LOW (ref 36.0–46.0)
Hemoglobin: 9.3 g/dL — ABNORMAL LOW (ref 12.0–15.0)
MCH: 28.8 pg (ref 26.0–34.0)
MCHC: 32.1 g/dL (ref 30.0–36.0)
MCV: 89.8 fL (ref 80.0–100.0)
Platelets: 138 10*3/uL — ABNORMAL LOW (ref 150–400)
RBC: 3.23 MIL/uL — ABNORMAL LOW (ref 3.87–5.11)
RDW: 15.5 % (ref 11.5–15.5)
WBC: 3.3 10*3/uL — ABNORMAL LOW (ref 4.0–10.5)
nRBC: 0 % (ref 0.0–0.2)

## 2022-03-14 LAB — MAGNESIUM
Magnesium: 1.6 mg/dL — ABNORMAL LOW (ref 1.7–2.4)
Magnesium: 2.5 mg/dL — ABNORMAL HIGH (ref 1.7–2.4)

## 2022-03-14 LAB — RESPIRATORY PANEL BY PCR

## 2022-03-14 LAB — BASIC METABOLIC PANEL
Anion gap: 10 (ref 5–15)
BUN: 43 mg/dL — ABNORMAL HIGH (ref 8–23)
CO2: 22 mmol/L (ref 22–32)
Calcium: 9.5 mg/dL (ref 8.9–10.3)
Chloride: 108 mmol/L (ref 98–111)
Creatinine, Ser: 1.93 mg/dL — ABNORMAL HIGH (ref 0.44–1.00)
GFR, Estimated: 26 mL/min — ABNORMAL LOW (ref >60)
Glucose, Bld: 118 mg/dL — ABNORMAL HIGH (ref 70–99)
Potassium: 4 mmol/L (ref 3.5–5.1)
Sodium: 140 mmol/L (ref 135–145)

## 2022-03-14 LAB — HEPATITIS PANEL, ACUTE
HCV Ab: NONREACTIVE
Hep A IgM: NONREACTIVE
Hep B C IgM: NONREACTIVE
Hepatitis B Surface Ag: NONREACTIVE

## 2022-03-14 LAB — PHOSPHORUS: Phosphorus: 4.1 mg/dL (ref 2.5–4.6)

## 2022-03-14 LAB — GLUCOSE, CAPILLARY
Glucose-Capillary: 134 mg/dL — ABNORMAL HIGH (ref 70–99)
Glucose-Capillary: 181 mg/dL — ABNORMAL HIGH (ref 70–99)
Glucose-Capillary: 198 mg/dL — ABNORMAL HIGH (ref 70–99)
Glucose-Capillary: 224 mg/dL — ABNORMAL HIGH (ref 70–99)
Glucose-Capillary: 233 mg/dL — ABNORMAL HIGH (ref 70–99)

## 2022-03-14 LAB — MRSA NEXT GEN BY PCR, NASAL: MRSA by PCR Next Gen: NOT DETECTED

## 2022-03-14 MED ORDER — FAMOTIDINE IN NACL 20-0.9 MG/50ML-% IV SOLN
20.0000 mg | INTRAVENOUS | Status: DC
  Administered 2022-03-15: 20 mg via INTRAVENOUS
  Filled 2022-03-14: qty 50

## 2022-03-14 MED ORDER — EPINEPHRINE 1 MG/10ML IJ SOSY: 0.1000 mg | PREFILLED_SYRINGE | Freq: Once | INTRAMUSCULAR | Status: DC

## 2022-03-14 MED ORDER — INSULIN ASPART 100 UNIT/ML IJ SOLN: 0.0000 [IU] | Freq: Three times a day (TID) | INTRAMUSCULAR | Status: DC

## 2022-03-14 MED ORDER — RACEPINEPHRINE HCL 2.25 % IN NEBU
0.5000 mL | INHALATION_SOLUTION | Freq: Once | RESPIRATORY_TRACT | Status: AC
  Administered 2022-03-14: 0.5 mL via RESPIRATORY_TRACT
  Filled 2022-03-14: qty 0.5

## 2022-03-14 MED ORDER — EPINEPHRINE 1 MG/10ML IJ SOSY
0.1000 mg | PREFILLED_SYRINGE | Freq: Once | INTRAMUSCULAR | Status: DC
Start: 2022-03-14 — End: 2022-03-14

## 2022-03-14 MED ORDER — DIPHENHYDRAMINE HCL 50 MG/ML IJ SOLN
INTRAMUSCULAR | Status: AC
  Administered 2022-03-14: 50 mg
  Filled 2022-03-14: qty 1

## 2022-03-14 MED ORDER — INSULIN ASPART 100 UNIT/ML IJ SOLN
0.0000 [IU] | INTRAMUSCULAR | Status: DC
  Administered 2022-03-14: 3 [IU] via SUBCUTANEOUS
  Administered 2022-03-15 – 2022-03-16 (×7): 2 [IU] via SUBCUTANEOUS
  Administered 2022-03-16: 1 [IU] via SUBCUTANEOUS
  Administered 2022-03-16 – 2022-03-17 (×8): 2 [IU] via SUBCUTANEOUS
  Administered 2022-03-17: 3 [IU] via SUBCUTANEOUS
  Administered 2022-03-18 (×2): 1 [IU] via SUBCUTANEOUS
  Administered 2022-03-18 (×3): 2 [IU] via SUBCUTANEOUS
  Administered 2022-03-19: 3 [IU] via SUBCUTANEOUS
  Administered 2022-03-19 – 2022-03-20 (×7): 2 [IU] via SUBCUTANEOUS
  Administered 2022-03-20: 1 [IU] via SUBCUTANEOUS
  Administered 2022-03-20 (×2): 2 [IU] via SUBCUTANEOUS
  Administered 2022-03-20: 1 [IU] via SUBCUTANEOUS
  Administered 2022-03-21: 2 [IU] via SUBCUTANEOUS
  Administered 2022-03-21 – 2022-03-22 (×6): 1 [IU] via SUBCUTANEOUS
  Administered 2022-03-22: 2 [IU] via SUBCUTANEOUS
  Administered 2022-03-23 – 2022-03-24 (×4): 1 [IU] via SUBCUTANEOUS

## 2022-03-14 MED ORDER — OSMOLITE 1.5 CAL PO LIQD
1000.0000 mL | ORAL | Status: DC
  Administered 2022-03-14 – 2022-03-20 (×7): 1000 mL
  Filled 2022-03-14 (×18): qty 1000

## 2022-03-14 MED ORDER — LEVOTHYROXINE SODIUM 75 MCG PO TABS
225.0000 ug | ORAL_TABLET | ORAL | Status: DC
  Administered 2022-03-15 – 2022-03-18 (×2): 225 ug
  Filled 2022-03-14 (×2): qty 1
  Filled 2022-03-14: qty 3

## 2022-03-14 MED ORDER — DIPHENHYDRAMINE HCL 50 MG/ML IJ SOLN: 50.0000 mg | Freq: Once | INTRAMUSCULAR | Status: AC

## 2022-03-14 MED ORDER — FAMOTIDINE IN NACL 20-0.9 MG/50ML-% IV SOLN
20.0000 mg | Freq: Once | INTRAVENOUS | Status: AC
  Administered 2022-03-14: 20 mg via INTRAVENOUS
  Filled 2022-03-14: qty 50

## 2022-03-14 MED ORDER — DEXAMETHASONE SODIUM PHOSPHATE 10 MG/ML IJ SOLN
10.0000 mg | Freq: Once | INTRAMUSCULAR | Status: AC
  Administered 2022-03-14: 10 mg via INTRAVENOUS
  Filled 2022-03-14: qty 1

## 2022-03-14 MED ORDER — ENOXAPARIN SODIUM 30 MG/0.3ML IJ SOSY
30.0000 mg | PREFILLED_SYRINGE | INTRAMUSCULAR | Status: AC
  Administered 2022-03-14 – 2022-03-19 (×6): 30 mg via SUBCUTANEOUS
  Filled 2022-03-14 (×6): qty 0.3

## 2022-03-14 MED ORDER — DEXAMETHASONE SODIUM PHOSPHATE 4 MG/ML IJ SOLN
4.0000 mg | Freq: Four times a day (QID) | INTRAMUSCULAR | Status: DC
  Administered 2022-03-14 – 2022-03-17 (×12): 4 mg via INTRAVENOUS
  Filled 2022-03-14 (×11): qty 1

## 2022-03-14 MED ORDER — LEVOTHYROXINE SODIUM 75 MCG PO TABS
150.0000 ug | ORAL_TABLET | ORAL | Status: DC
  Administered 2022-03-16 – 2022-03-24 (×7): 150 ug
  Filled 2022-03-14 (×7): qty 2

## 2022-03-14 MED ORDER — ACETAMINOPHEN 325 MG PO TABS: 650.0000 mg | ORAL_TABLET | Freq: Four times a day (QID) | ORAL | Status: DC | PRN

## 2022-03-14 MED ORDER — CHLORHEXIDINE GLUCONATE 0.12 % MT SOLN
15.0000 mL | Freq: Two times a day (BID) | OROMUCOSAL | Status: DC
  Administered 2022-03-14: 15 mL via OROMUCOSAL

## 2022-03-14 MED ORDER — EPINEPHRINE 0.3 MG/0.3ML IJ SOAJ
0.3000 mg | Freq: Once | INTRAMUSCULAR | Status: AC
  Administered 2022-03-14: 0.3 mg via INTRAMUSCULAR
  Filled 2022-03-14: qty 0.6

## 2022-03-14 MED ORDER — CHLORHEXIDINE GLUCONATE CLOTH 2 % EX PADS
6.0000 | MEDICATED_PAD | Freq: Every day | CUTANEOUS | Status: DC
  Administered 2022-03-15 – 2022-03-20 (×6): 6 via TOPICAL

## 2022-03-14 MED ORDER — MAGNESIUM SULFATE 2 GM/50ML IV SOLN
2.0000 g | Freq: Once | INTRAVENOUS | Status: AC
  Administered 2022-03-14: 2 g via INTRAVENOUS
  Filled 2022-03-14: qty 50

## 2022-03-14 MED ORDER — CHLORHEXIDINE GLUCONATE CLOTH 2 % EX PADS: 6.0000 | MEDICATED_PAD | Freq: Every day | CUTANEOUS | Status: DC

## 2022-03-14 MED ORDER — MIRTAZAPINE 15 MG PO TABS
7.5000 mg | ORAL_TABLET | Freq: Every day | ORAL | Status: DC
  Administered 2022-03-14 – 2022-03-21 (×7): 7.5 mg
  Filled 2022-03-14 (×7): qty 1

## 2022-03-14 MED ORDER — ACETAMINOPHEN 650 MG RE SUPP: 650.0000 mg | Freq: Four times a day (QID) | RECTAL | Status: DC | PRN

## 2022-03-14 MED ORDER — DIPHENHYDRAMINE HCL 50 MG/ML IJ SOLN
50.0000 mg | Freq: Four times a day (QID) | INTRAMUSCULAR | Status: DC
  Administered 2022-03-14 – 2022-03-18 (×16): 50 mg via INTRAVENOUS
  Filled 2022-03-14 (×16): qty 1

## 2022-03-14 MED ORDER — INSULIN ASPART 100 UNIT/ML IJ SOLN: 0.0000 [IU] | INTRAMUSCULAR | Status: DC

## 2022-03-14 MED ORDER — EPINEPHRINE 1 MG/10ML IJ SOSY
PREFILLED_SYRINGE | INTRAMUSCULAR | Status: AC
  Filled 2022-03-14: qty 10

## 2022-03-14 MED ORDER — ORAL CARE MOUTH RINSE: 15.0000 mL | Freq: Two times a day (BID) | OROMUCOSAL | Status: DC

## 2022-03-14 MED ORDER — METOPROLOL TARTRATE 12.5 MG HALF TABLET
12.5000 mg | ORAL_TABLET | Freq: Two times a day (BID) | ORAL | Status: DC
  Administered 2022-03-14 – 2022-03-24 (×20): 12.5 mg
  Filled 2022-03-14 (×20): qty 1

## 2022-03-14 MED ORDER — PROSOURCE TF PO LIQD
45.0000 mL | Freq: Two times a day (BID) | ORAL | Status: DC
  Administered 2022-03-14 – 2022-03-25 (×22): 45 mL
  Filled 2022-03-14 (×22): qty 45

## 2022-03-14 NOTE — Consult Note (Signed)
? ?NAME:  Lauren Henry, MRN:  [id number], DOB:  01-02-1941, LOS: 0 ?ADMISSION DATE:  03/13/2022, CONSULTATION DATE:  03/14/22 ?REFERRING MD:  Elgergawy CHIEF COMPLAINT:  Facial swelling, angioedema.  ? ?History of Present Illness:  ?Lauren Henry is a 81 y.o. female who has a PMH as outlined below.  She presented to [facility] ED 3/23 with left eye swelling that began the day prior.  Husband also noticed a rash to her forehead and face that began roughly around the same time.  Husband states that rash was erythematous and raised (see photos below).  Per chart review, she had recent hospitalization 3/2 through 3/5 for syncope secondary to UTI.  She was taken off blood pressure medications and treated with antibiotics.  She was then again hospitalized 3/17 through 3/19 for failure to thrive.  Husband states ever since then, she has had a progressive decline and in fact she has had 30 pound weight loss dating back to Christmas 2022.  CT scan of the chest/abdomen/pelvis was obtained to rule out an underlying malignancy and this only revealed a 8 mm noncalcified nodule in the right lower lobe for which repeat CT was recommended in 2 to 3 months. ? ?In regards to her current presentation, husband reported that over the last week, speech had become slightly slurred which coincided with neck swelling as well as dysphagia with coughing while eating.  She apparently was given B12 and B1 per husband 3/22 and rash started shortly thereafter. ? ?3/23 swelling worsened and she had slight difficulty in breathing; therefore, EMS was called. ? ?Morning of 3/24, facial swelling worsened and she started to have tongue swelling along with tachypnea and difficulty breathing.  PCCM was subsequently called in consultation due to concern for worsening angioedema. ? ?At the time of our evaluation, she has edematous tongue; however, her posterior pharynx is open.  She is able to speak in 3-4 word sentences.  She has been transferred to the ICU for  close monitoring and observation.  She received 10 mg Decadron, 20 mg Pepcid, 50 mg Benadryl as well as IM epinephrine. ? 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; Knee pain; Right-sided low back pain with right-sided sciatica; Elevated troponin with new LBBB; Chronic anticoagulation; CAD in native artery; Chronic renal disease, stage III ([place]); Anemia; Nausea vomiting and diarrhea; AKI (acute kidney injury) ([place]); Gastroenteropathy; Weakness; Orthostatic hypotension; Glaucoma; Anxiety disorder; Postural dizziness with presyncope; Syncope and collapse; UTI (urinary tract infection); Syncope; Stage 3b chronic kidney disease (CKD) ([place]); Failure to thrive in adult; Parkinsonian features; Microscopic hematuria; Protein-calorie malnutrition, severe; Acute kidney injury superimposed on chronic kidney disease ([place]); Neck and right arm swelling; Rash; Dysphagia; Swelling of right upper extremity; Eye swelling, left; and Chronic diastolic CHF (congestive heart failure) ([place]) on their problem list. ? ? ?Significant Hospital Events: ?Including procedures, antibiotic start and stop dates in addition to other pertinent events   ?3/23 admit. ?3/24 angioedema, PCCM consult, transfer to ICU. ? ?Interim History / Subjective:  ?Awake, vitals stable. ?Does have facial and tongue edema but posterior pharynx is open.  ?Able to speak in 2 - 3 word sentences. ? ?Objective:  ?Blood  pressure 127/81, pulse 75, temperature 98.2 ?F (36.8 ?C), temperature source Oral, resp. rate (!) 22, height 5' 5"  (1.651  m), weight 78.8 kg, SpO2 90 %. ?   ?   ? ?Intake/Output Summary (Last 24 hours) at 03/14/2022 0942 ?Last data filed at 03/14/2022 0600 ?Gross per 24 hour  ?Intake 716.71 ml  ?Output 150 ml  ?Net 566.71 ml  ? ?Filed Weights  ? 03/13/22 0900 03/14/22 0500  ?Weight: 76.7 kg 78.8 kg  ? ? ?Examination: ?General: Adult female, resting in bed, in mild distress. ?Neuro: Awake but slightly altered per husband.  She is able to answer basic questions in 2 - 3 word sentences.  Follows basic commands. ?HEENT: Facial and neck edema.  Heliotrope erythematous periorbital rash and edema. Tongue edematous but open in posterior pharynx.  Able to speak in 2 - 3 word sentences. ?Cardiovascular: RRR, no M/R/G.  ?Lungs: Respirations slightly labored. Upper airway stridor but clear in lung fields. ?Abdomen: BS x 4, soft, NT/ND.  ?Musculoskeletal: No gross deformities, no edema.  ?Skin: Intact, warm, no rashes. ? ? ?Photo from admit 3/23 ? ? ?Labs/imaging personally reviewed:  ?CT soft tissue neck 3/23 > enlarged tonsils bilaterally, moderate left periorbital edema without extension into the orbit. ? ?Assessment & Plan:  ? ?Angioedema - unclear etiology at this point? Progressive reaction from admit though rash has improved somewhat. She is s/p 40m Decadron, 247mPepcid, 5054menadryl, 0.3mg11minephrine. Question if reaction to empiric Clindamycin that she received on admit?. ?- Transfer to ICU. ?- Start Decadron 4mg 35mrs, Pepcid 20mg 28my, Benadryl 50mg q53m. ?- Low threshold for intubation if any worsening (she is currently protecting airway and posterior pharynx is open). ?- Send off C1-esterase inhibitor, Hepatitis panel, Cryoglobin (though doubt Cryoglobulinemia here). ?- F/u on RUE fenous duplex to r/o DVT given RUE edema. ? ?? Dermatomyositis - new rash and husband does report some progressive muscular weakness (though somewhat improved over recent 2 - 3 weeks) along with occasional myalgias/arthralgias, dysphagia, and recent  weight loss/failure to thrive.  ESR and CRP both significantly elevated. ?- Supportive care. ?- Can consider biopsy once stabilized. ? ?Dysphagia - ? 2/2 above. ?- SLP eval once angioedema has resolved. ?- NPO in the meantime. ? ?Tonsillitis. GAS and RVP negative. Had been started on Clindamycin on admit empirically.  Question whether this could have played a part in her reaction (ie unknown allergy?). ?- Hold Clindamycin, doubt bacterial infection anyways. ? ?AoCKD. ?- Gentle fluids. ?- Follow BMP. ? ?Hx PAF, chronic dCHF. ?- Holding Eliquis per neuro recs incase she needs CT myelogram in th eevent of continued decline. ? ?Hx hypothyroidism - Free T4 slightly elevated. ?- Continue synthroid when taking PO but follow up with PCP regarding slight dose decrease. ? ? ? ? ?Best practice (evaluated daily):  ?Diet/type: NPO ?DVT prophylaxis: not indicated ?GI prophylaxis: H2B ?Lines: N/A ?Foley:  N/A ?Code Status:  full code ?Last date of multidisciplinary goals of care discussion: None yet. ? ?Labs   ?CBC: ?Recent Labs  ?Lab 03/07/22 ?1009 03/08/22 ?0720 03/13/22 ?0915 [id number] ?0022  ?WBC 12.0* 9.3 7.3 3.3*  ?NEUTROABS 9.5*  --  6.6  --   ?HGB 11.2* 9.3* 9.2* 9.3*  ?HCT 34.4* 29.2* 28.5* 29.0*  ?MCV 89.6 90.1 90.5 89.8  ?PLT 188 138* 150 138*  ? ? ?Basic Metabolic Panel: ?Recent Labs  ?Lab 03/07/22 ?1009 03/07/22 ?1543 03/08/22 ?0720 03/08/22 ?2354 03/13/22 ?0915 [id number] ?0022  ?NA 135  --  136 135 136 140  ?  K 4.0  --  3.6 3.5 3.6 4.0  ?CL 100  --  104 102 102 108  ?CO2 25  --  22 22 22 22   ?GLUCOSE 125*  --  106* 147* 258* 118*  ?BUN 39*  --  29* 29* 44* 43*  ?CREATININE 1.61*  --  1.39* 1.46* 2.05* 1.93*  ?CALCIUM 9.6  --  9.2 9.0 9.8 9.5  ?MG  --  1.5* 2.0  --   --   --   ? ?GFR: ?Estimated Creatinine Clearance: 24.1 mL/min (A) (by C-G formula based on SCr of 1.93 mg/dL (H)). ?Recent Labs  ?Lab 03/07/22 ?1009 03/07/22 ?1146 03/08/22 ?0720 03/13/22 ?6578 03/14/22 ?0022  ?WBC 12.0*  --  9.3 7.3 3.3*  ?LATICACIDVEN  2.1* 1.5  --   --   --   ? ? ?Liver Function Tests: ?Recent Labs  ?Lab 03/07/22 ?1009 03/08/22 ?0720 03/13/22 ?0915  ?AST 37 28 57*  ?ALT 62* 48* 75*  ?ALKPHOS 46 96 29  ?BILITOT 1.6* 1.2 0.9  ?PROT 6.9 5.8* 6

## 2022-03-14 NOTE — Progress Notes (Addendum)
eLink Physician-Brief Progress Note ?Patient Name: Lauren Henry ?DOB: 1941/02/05 ?MRN: [id number] ? ? ?Date of Service ? 03/14/2022  ?HPI/Events of Note ? Mg 1.6 at 16:26 ?CBG's running high 198, 224, 181 ?Pt now on tube feeds and RN asking for tube feeding insulin coverage  ?eICU Interventions ? 2 gm mag and ssi ordered  ? ? ? ?Intervention Category ?Intermediate Interventions: Hyperglycemia - evaluation and treatment;Electrolyte abnormality - evaluation and management ? ?Ranee Gosselin ?03/14/2022, 9:20 PM ? ?21:58 ? ?Changed to SSI q4 hr.  ? ? ?23:45 ?Camera eval: ?For increasing wob. ?Sats 98%, RR > 25 ?MAP good. HR 105 ?No stridor noted. Confused. Encephalopathy. ? ?39 F in ICU for angioedema since 23 rd. worsening mentation and work of breathing in this shift. 23 rd x ray clear. got nebs, on steroids. abdominal breathing +. obese. need bed side evaluation. ordered stat CxR and ABG.  ? ?Angioedema, unclear inciting event at this point ?Diffuse rash with differential of dermatomyositis versus drug-induced versus cryoglobulinemia ?Dysphagia ?Pancytopenia ? ?Discussed with bed side RN. Notified CCM team to evaluate at bed side.  ? ?1:47 ?RN asking for NS eye drops and increased ceiling on fentanyl infusion and maybe some intermittent sedation. ?Ordered both. ABG type 2 failure- and metabolic acidosis. On Vent now. ?Hg 9.2, stable. ?CxR film seen.  ?Discussed with RN, to pull 1 cm ET upwards.  ? ? ? ?

## 2022-03-14 NOTE — Progress Notes (Signed)
?  03/14/22 1005  ?Clinical Encounter Type  ?Visited With Health care provider;Patient and family together  ?Visit Type Initial  ?Referral From Chaplain ?Ernestina Patches called by phone requesting support and prayer)  ?Consult/Referral To Chaplain  ?Spiritual Encounters  ?Spiritual Needs Prayer;Emotional;Grief support  ?Stress Factors  ?Patient Stress Factors Exhausted;Major life changes;Health changes  ?Family Stress Factors Exhausted;Major life changes  ? ?Spiritual Consultation requesting Support and Prayer for Ms. Dava Najjar came from [facility] via phone call. Met with Ms. Elgersma and her husband Quillian Quince at patient's bedside. Ms. Stennis was experiencing extreme respiratory distress and had great difficulty speaking. Chaplain provided compassionate and meaningful presence for Mr. And Mrs. Hittle. Per Daniel's request we Prayed for healing, comfort, and healing and God's presence with the [place] during this time. [address] [place], [place] [phone]. ?

## 2022-03-14 NOTE — Progress Notes (Signed)
SLP Cancellation Note ? ?Patient Details ?Name: MALAVIKA LIRA ?MRN: [id number] ?DOB: [birth year] ? ? ?Cancelled treatment:       Reason Eval/Treat Not Completed: Medical issues which prohibited therapy. Pt having difficulty breathing. Will defer further assessment of swallowing at this time and f/u via chart for readiness.  ? ? ?Decoda Van, Riley Nearing ?03/14/2022, 8:00 AM ?

## 2022-03-14 NOTE — Progress Notes (Signed)
Called into patients room by husband due to concerns of difficulty breathing. Consulting civil engineer and this nurse assessed patients airway. This nurse noted patient to have difficulty breathing, rhonchi to bilateral lungs, mouth breathing, and increased swelling to tongue, no response to verbal and tactile stimulus. Called Rapid Response nurse to bedside to further assess patient. After several attempts to arouse patient, she became slow to respond to tactile stimuli. Rapid nurse cleansed mouth and started patient on 2L [place]. Notified MD Elgergawy of change in condition and placed orders for dexamethasone 4mg  IV, diphenhydramine 50 mg IV, and epinephrine 0.3mg  IM. Orders placed for transfer to 8M, and Charge RN gave report to 8M.  ?

## 2022-03-14 NOTE — Progress Notes (Signed)
Initial Nutrition Assessment ? ?DOCUMENTATION CODES:  ? ?Severe malnutrition in context of acute illness/injury ? ?INTERVENTION:  ? ?Initiate Tube Feeds via Cortrak:  ?Start Osmolite 1.5 @ 25 mL/hr and advance by 10 mL q8h until goal of 55 mL/hr is reached (1320 mL/day) ?45 mL ProSource TF - BID ?Provides 2060 kcal, 105 gm protein, and 1006 mL free water daily. ?Monitor magnesium, potassium, and phosphorus BID for at least 3 days, MD to replete as needed, as pt is at risk for refeeding syndrome given malnutrition and poor PO intake PTA. ? ?NUTRITION DIAGNOSIS:  ? ?Severe Malnutrition related to acute illness as evidenced by moderate muscle depletion, moderate fat depletion, percent weight loss. ? ?GOAL:  ? ?Patient will meet greater than or equal to 90% of their needs ? ?MONITOR:  ? ?Labs, Weight trends ? ?REASON FOR ASSESSMENT:  ? ?Malnutrition Screening Tool ?  ? ?ASSESSMENT:  ? ?81 y.o. presented to the ED with L eye swelling. Recent admission 3/2-3/5 for UTI and syncopal episode and 3/17-3/19 for FTT symptoms. PMH includes CHF, CKD III, malnutrition, and HTN. Pt admitted with eye swelling, rash, dysphagia, and AKI on CKD.   ? ?3/24 - transferred to ICU due to respiratory distress; Cortrak placed (tip placed) ? ?Pt unable to provide much information due to difficulty speaking.  ? ?Pt reports that she has been on a liquid diet recently, unable to provide how long.  ? ?Pt reports that she has lost 30# recently. Unable to provide UBW. Per EMR, pt has had 8% weight loss within 3 months. Pt reports that she ambulates with a walker  ? ?Medications reviewed and include: Decadron, Remeron, IV Pepcid ?Labs reviewed: BUN 43, Creatinine 1.93 ? ?NUTRITION - FOCUSED PHYSICAL EXAM: ? ?Flowsheet Row Most Recent Value  ?Orbital Region Moderate depletion  ?Upper Arm Region Mild depletion  ?Thoracic and Lumbar Region Mild depletion  ?Buccal Region Moderate depletion  ?Temple Region Moderate depletion  ?Clavicle Bone Region  Moderate depletion  ?Clavicle and Acromion Bone Region Moderate depletion  ?Scapular Bone Region Moderate depletion  ?Dorsal Hand Moderate depletion  ?Patellar Region Moderate depletion  ?Anterior Thigh Region Moderate depletion  ?Posterior Calf Region Moderate depletion  ?Edema (RD Assessment) None  ?Hair Reviewed  ?Eyes Reviewed  ?Mouth Reviewed  ?Skin Reviewed  ?Nails Reviewed  ? ?Diet Order:   ?Diet Order   ? ?       ?  Diet NPO time specified  Diet effective now       ?  ? ?  ?  ? ?  ? ?EDUCATION NEEDS:  ? ?Not appropriate for education at this time ? ?Skin:  Skin Assessment: Reviewed RN Assessment ? ?Last BM:  3/23 - Type 3 ? ?Height:  ?Ht Readings from Last 1 Encounters:  ?03/13/22 5\' 5"  (1.651 m)  ? ?Weight:  ?Wt Readings from Last 1 Encounters:  ?03/14/22 78.8 kg  ? ?Ideal Body Weight:  56.8 kg ? ?BMI:  Body mass index is 28.91 kg/m?. ? ?Estimated Nutritional Needs:  ?Kcal:  2000-2200 ?Protein:  100-115 grams ?Fluid:  >/= 2 L ? ? ? ?03/16/22 RD, LDN ?Clinical Dietitian ?See AMiON for contact information.  ? ?

## 2022-03-14 NOTE — Progress Notes (Signed)
?  Transition of Care (TOC) Screening Note ? ? ?Patient Details  ?Name: Lauren Henry ?Date of Birth: 03-23-1941 ? ? ?Transition of Care (TOC) CM/SW Contact:    ?Mearl Latin, LCSW ?Phone Number: ?03/14/2022, 8:33 AM ? ? ? ?[facility] Department [facility]) has reviewed patient and no TOC needs have been identified at this time. We will continue to monitor patient advancement through interdisciplinary progression rounds. If new patient transition needs arise, please place a TOC consult. ? ? ?

## 2022-03-14 NOTE — Significant Event (Signed)
Rapid Response Event Note  ? ?Reason for Call :  ?Respiratory distress ? ?Patient is admitted with possible allergic reaction, facial swelling hoarse voice and rash ? ?Initial Focused Assessment:  ?This morning her breathing is more labored.  She has upper airway turbulence.   ?Difficulty swallowing, thick tongue, swollen lips ?Her mouth is very dry ?Lung sounds clear ?Heart tones irregular ? ?BP 158/88  Vpaced 111  RR 24-30  O2 sat 89% on RA ? ?Her face and neck are swollen.  Her right hand is also very edematous.  Diffuse rash across her body  ? ?Dr Jethro Bastos at bedside to assess patient ?Husband at bedside during event ? ?Interventions:  ?Oral care ?Decadron ?Benadryl ?Epi Pen IM ?Racemic Epi ?Placed 2nd PIV ? ?CCM at bedside to assess patient (Rahul & Dr Merrily Pew) ? ?Transfer to ICU (2M12) ? ? ?Plan of Care:  ? ? ? ?Event Summary:  ? ?MD Notified:  Elgergawy ?Call Time: [phone] ?Arrival Time: 0800 ?End Time: 0915 ? ?Marcellina Millin, RN ?

## 2022-03-14 NOTE — Progress Notes (Signed)
Upper extremity venous attempted. Patient receiving RN care and to be transferred. Will attempt again as schedule and patient availability permits.  ? ?Jean Rosenthal, RDMS, RVT ? ?

## 2022-03-14 NOTE — Evaluation (Signed)
Physical Therapy Evaluation Patient Details Name: Lauren Henry MRN: [id number] DOB: 07-Apr-1941 Today's Date: 03/14/2022  History of Present Illness  81 y.o female  presents to the ED via ambulance 3/23 with a chief complaint of right arm swelling and left eye swollen x a few days.   Allergic reaction?  PMH:   complete heart block, HTN, Afib on eliquis  Clinical Impression  Pt admitted with above diagnosis. Pt was able to stand to [facility] with +2 mod assist and cues. Pt has difficulty standing upright fully and husband usually cares for pt on his own.  Pt has had decline over last month per husband.  Will benefit from AIR to get back to prior functional status.  Pt currently with functional limitations due to the deficits listed below (see PT Problem List). Pt will benefit from skilled PT to increase their independence and safety with mobility to allow discharge to the venue listed below.          Recommendations for follow up therapy are one component of a multi-disciplinary discharge planning process, led by the attending physician.  Recommendations may be updated based on patient status, additional functional criteria and insurance authorization.  Follow Up Recommendations Acute inpatient rehab (3hours/day)    Assistance Recommended at Discharge Frequent or constant Supervision/Assistance  Patient can return home with the following  Assistance with cooking/housework;Assist for transportation;Help with stairs or ramp for entrance;A lot of help with walking and/or transfers;A lot of help with bathing/dressing/bathroom    Equipment Recommendations None recommended by PT  Recommendations for Other Services  Rehab consult    Functional Status Assessment Patient has had a recent decline in their functional status and demonstrates the ability to make significant improvements in function in a reasonable and predictable amount of time.     Precautions / Restrictions Precautions Precautions:  Fall Precaution Comments: Parkinson's features per chart Restrictions Weight Bearing Restrictions: No      Mobility  Bed Mobility Overal bed mobility: Needs Assistance Bed Mobility: Supine to Sit     Supine to sit: Mod assist, HOB elevated, +2 for physical assistance     General bed mobility comments: Assist for trunk elevation and to scoot hips towards EOB in preparation to stand    Transfers Overall transfer level: Needs assistance Equipment used: Rolling walker (2 wheels) Transfers: Sit to/from Stand, Bed to chair/wheelchair/BSC Sit to Stand: Mod assist, +2 physical assistance, +2 safety/equipment, Max assist           General transfer comment: ModA+2 to stand from low bed surface for boost up into standing. Cues for hand placement and hip extension to come into upright standing. Pt did not stand fully upright and incr weight on her heels bilaterally even with cues and assist. Pt in somewhat crouched posture and could not pivot. noted BM on pad and bed.  Obtained Stedy and pt was able to stand to [facility] with mod assist of 2, cleaned pt, placed pads under buttocks and then moved pt to recliner. Transfer via Lift Equipment: Stedy  Ambulation/Gait               General Gait Details: did not tolerate standing to walk  Stairs            Wheelchair Mobility    Modified Rankin (Stroke Patients Only)       Balance Overall balance assessment: Needs assistance Sitting-balance support: Feet supported, Bilateral upper extremity supported Sitting balance-Leahy Scale: Poor Sitting balance - Comments: Needed Ue support  in sitting as she has kyphotic posture and leans posteriorly at times.   Standing balance support: Reliant on assistive device for balance, Bilateral upper extremity supported, During functional activity Standing balance-Leahy Scale: Poor Standing balance comment: relies on UE supoprt and external assist with [facility]                              Pertinent Vitals/Pain Pain Assessment Pain Assessment: Faces Faces Pain Scale: Hurts little more Pain Location: generalized Pain Descriptors / Indicators: Discomfort, Grimacing Pain Intervention(s): Limited activity within patient's tolerance, Monitored during session, Repositioned    Home Living Family/patient expects to be discharged to:: Private residence Living Arrangements: Spouse/significant other Available Help at Discharge: Family;Available 24 hours/day Type of Home: House Home Access: Ramped entrance Entrance Stairs-Rails: Can reach both     Home Layout: One level Home Equipment: Agricultural consultant (2 wheels);Cane - single point;Grab bars - toilet;Grab bars - tub/shower;Hand held shower head;Shower seat;BSC/3in1;Toilet riser;Wheelchair - manual (15 grab bars throughout home)      Prior Function Prior Level of Function : Needs assist       Physical Assist : Mobility (physical);ADLs (physical)     Mobility Comments: able to ambulate with RW short distances in the home ADLs Comments: Daughter and husband has been assisting with bathing in the past month     Hand Dominance   Dominant Hand: Right    Extremity/Trunk Assessment   Upper Extremity Assessment Upper Extremity Assessment: Defer to OT evaluation    Lower Extremity Assessment Lower Extremity Assessment: RLE deficits/detail;LLE deficits/detail RLE Deficits / Details: grossly 3/5 LLE Deficits / Details: grossly 3/5    Cervical / Trunk Assessment Cervical / Trunk Assessment: Kyphotic  Communication   Communication: No difficulties  Cognition Arousal/Alertness: Awake/alert Behavior During Therapy: WFL for tasks assessed/performed Overall Cognitive Status: Within Functional Limits for tasks assessed Area of Impairment: Safety/judgement                         Safety/Judgement: Decreased awareness of deficits     General Comments: husband present and seems to be baseline cognition. WFL  for tasks assessed this session        General Comments General comments (skin integrity, edema, etc.): VSS on RA however DOE 3/4.    Exercises General Exercises - Lower Extremity Ankle Circles/Pumps: AROM, Both, 10 reps, Supine Long Arc Quad: AROM, Both, 5 reps, Seated   Assessment/Plan    PT Assessment Patient needs continued PT services  PT Problem List Decreased strength;Decreased activity tolerance;Decreased balance;Decreased mobility;Cardiopulmonary status limiting activity       PT Treatment Interventions DME instruction;Gait training;Functional mobility training;Therapeutic activities;Therapeutic exercise;Balance training;Neuromuscular re-education;Patient/family education    PT Goals (Current goals can be found in the Care Plan section)  Acute Rehab PT Goals Patient Stated Goal: home with husband PT Goal Formulation: With patient/family Time For Goal Achievement: 03/28/22 Potential to Achieve Goals: Good    Frequency Min 3X/week     Co-evaluation               AM-PAC PT "6 Clicks" Mobility  Outcome Measure Help needed turning from your back to your side while in a flat bed without using bedrails?: Total Help needed moving from lying on your back to sitting on the side of a flat bed without using bedrails?: Total Help needed moving to and from a bed to a chair (including a wheelchair)?: Total Help  needed standing up from a chair using your arms (e.g., wheelchair or bedside chair)?: Total Help needed to walk in hospital room?: Total Help needed climbing 3-5 steps with a railing? : Total 6 Click Score: 6    End of Session Equipment Utilized During Treatment: Gait belt Activity Tolerance: Patient limited by fatigue Patient left: with call bell/phone within reach;with family/visitor present;in chair;with chair alarm set Nurse Communication: Mobility status;Need for lift equipment (use Stedy) PT Visit Diagnosis: Muscle weakness (generalized) (M62.81);Other  abnormalities of gait and mobility (R26.89)    Time: 1610-9604 PT Time Calculation (min) (ACUTE ONLY): 38 min   Charges:   PT Evaluation $PT Eval Moderate Complexity: 1 Mod PT Treatments $Therapeutic Exercise: 8-22 mins $Therapeutic Activity: 8-22 mins        Donette Mainwaring M,PT Acute Rehab Services [phone] (pager)   Bevelyn Buckles 03/14/2022, 3:55 PM

## 2022-03-14 NOTE — Procedures (Signed)
Cortrak  Tube Type:  Cortrak - 43 inches Tube Location:  Left nare Initial Placement:  Stomach Secured by: Bridle Technique Used to Measure Tube Placement:  Marking at nare/corner of mouth Cortrak Secured At:  69 cm  Cortrak Tube Team Note:  Consult received to place a Cortrak feeding tube.   X-ray is required, abdominal x-ray has been ordered by the Cortrak team. Please confirm tube placement before using the Cortrak tube.   If the tube becomes dislodged please keep the tube and contact the Cortrak team at [email or web address] (password [email or web address]) for replacement.  If after hours and replacement cannot be delayed, place a NG tube and confirm placement with an abdominal x-ray.    Torin Modica MS, RD, LDN Please refer to AMION for RD and/or RD on-call/weekend/after hours pager   

## 2022-03-15 ENCOUNTER — Inpatient Hospital Stay (HOSPITAL_COMMUNITY): Payer: Medicare PPO

## 2022-03-15 ENCOUNTER — Inpatient Hospital Stay: Payer: Self-pay

## 2022-03-15 DIAGNOSIS — J9601 Acute respiratory failure with hypoxia: Secondary | ICD-10-CM | POA: Diagnosis not present

## 2022-03-15 DIAGNOSIS — H5789 Other specified disorders of eye and adnexa: Secondary | ICD-10-CM | POA: Diagnosis not present

## 2022-03-15 LAB — POCT I-STAT 7, (LYTES, BLD GAS, ICA,H+H)
Acid-base deficit: 2 mmol/L (ref 0.0–2.0)
Acid-base deficit: 4 mmol/L — ABNORMAL HIGH (ref 0.0–2.0)
Acid-base deficit: 5 mmol/L — ABNORMAL HIGH (ref 0.0–2.0)
Bicarbonate: 22 mmol/L (ref 20.0–28.0)
Bicarbonate: 23.3 mmol/L (ref 20.0–28.0)
Bicarbonate: 24.9 mmol/L (ref 20.0–28.0)
Calcium, Ion: 1.44 mmol/L — ABNORMAL HIGH (ref 1.15–1.40)
Calcium, Ion: 1.45 mmol/L — ABNORMAL HIGH (ref 1.15–1.40)
Calcium, Ion: 1.46 mmol/L — ABNORMAL HIGH (ref 1.15–1.40)
HCT: 24 % — ABNORMAL LOW (ref 36.0–46.0)
HCT: 27 % — ABNORMAL LOW (ref 36.0–46.0)
HCT: 28 % — ABNORMAL LOW (ref 36.0–46.0)
Hemoglobin: 8.2 g/dL — ABNORMAL LOW (ref 12.0–15.0)
Hemoglobin: 9.2 g/dL — ABNORMAL LOW (ref 12.0–15.0)
Hemoglobin: 9.5 g/dL — ABNORMAL LOW (ref 12.0–15.0)
O2 Saturation: 100 %
O2 Saturation: 98 %
O2 Saturation: 98 %
Patient temperature: 97.8
Patient temperature: 98
Patient temperature: 98
Potassium: 3.7 mmol/L (ref 3.5–5.1)
Potassium: 3.8 mmol/L (ref 3.5–5.1)
Potassium: 3.8 mmol/L (ref 3.5–5.1)
Sodium: 141 mmol/L (ref 135–145)
Sodium: 143 mmol/L (ref 135–145)
Sodium: 143 mmol/L (ref 135–145)
TCO2: 24 mmol/L (ref 22–32)
TCO2: 25 mmol/L (ref 22–32)
TCO2: 27 mmol/L (ref 22–32)
pCO2 arterial: 43.2 mmHg (ref 32–48)
pCO2 arterial: 49.2 mmHg — ABNORMAL HIGH (ref 32–48)
pCO2 arterial: 64.4 mmHg — ABNORMAL HIGH (ref 32–48)
pH, Arterial: 7.194 — CL (ref 7.35–7.45)
pH, Arterial: 7.257 — ABNORMAL LOW (ref 7.35–7.45)
pH, Arterial: 7.338 — ABNORMAL LOW (ref 7.35–7.45)
pO2, Arterial: 113 mmHg — ABNORMAL HIGH (ref 83–108)
pO2, Arterial: 117 mmHg — ABNORMAL HIGH (ref 83–108)
pO2, Arterial: 479 mmHg — ABNORMAL HIGH (ref 83–108)

## 2022-03-15 LAB — CBC WITH DIFFERENTIAL/PLATELET
Abs Immature Granulocytes: 0 10*3/uL (ref 0.00–0.07)
Basophils Absolute: 0 10*3/uL (ref 0.0–0.1)
Basophils Relative: 0 %
Eosinophils Absolute: 0 10*3/uL (ref 0.0–0.5)
Eosinophils Relative: 0 %
HCT: 27.3 % — ABNORMAL LOW (ref 36.0–46.0)
Hemoglobin: 8.6 g/dL — ABNORMAL LOW (ref 12.0–15.0)
Lymphocytes Relative: 8 %
Lymphs Abs: 0.4 10*3/uL — ABNORMAL LOW (ref 0.7–4.0)
MCH: 28.9 pg (ref 26.0–34.0)
MCHC: 31.5 g/dL (ref 30.0–36.0)
MCV: 91.6 fL (ref 80.0–100.0)
Monocytes Absolute: 0 10*3/uL — ABNORMAL LOW (ref 0.1–1.0)
Monocytes Relative: 1 %
Neutro Abs: 4.3 10*3/uL (ref 1.7–7.7)
Neutrophils Relative %: 91 %
Platelets: 145 10*3/uL — ABNORMAL LOW (ref 150–400)
RBC: 2.98 MIL/uL — ABNORMAL LOW (ref 3.87–5.11)
RDW: 15.8 % — ABNORMAL HIGH (ref 11.5–15.5)
WBC: 4.7 10*3/uL (ref 4.0–10.5)
nRBC: 0 % (ref 0.0–0.2)
nRBC: 0 /100 WBC

## 2022-03-15 LAB — GLUCOSE, CAPILLARY
Glucose-Capillary: 176 mg/dL — ABNORMAL HIGH (ref 70–99)
Glucose-Capillary: 175 mg/dL — ABNORMAL HIGH (ref 70–99)
Glucose-Capillary: 199 mg/dL — ABNORMAL HIGH (ref 70–99)
Glucose-Capillary: 194 mg/dL — ABNORMAL HIGH (ref 70–99)
Glucose-Capillary: 155 mg/dL — ABNORMAL HIGH (ref 70–99)

## 2022-03-15 LAB — CBC
HCT: 27.5 % — ABNORMAL LOW (ref 36.0–46.0)
Hemoglobin: 8.6 g/dL — ABNORMAL LOW (ref 12.0–15.0)
MCH: 28.8 pg (ref 26.0–34.0)
MCHC: 31.3 g/dL (ref 30.0–36.0)
MCV: 92 fL (ref 80.0–100.0)
Platelets: 135 10*3/uL — ABNORMAL LOW (ref 150–400)
RBC: 2.99 MIL/uL — ABNORMAL LOW (ref 3.87–5.11)
RDW: 15.8 % — ABNORMAL HIGH (ref 11.5–15.5)
WBC: 5.2 10*3/uL (ref 4.0–10.5)
nRBC: 0 % (ref 0.0–0.2)

## 2022-03-15 LAB — BASIC METABOLIC PANEL
Anion gap: 10 (ref 5–15)
BUN: 61 mg/dL — ABNORMAL HIGH (ref 8–23)
CO2: 21 mmol/L — ABNORMAL LOW (ref 22–32)
Calcium: 9.9 mg/dL (ref 8.9–10.3)
Chloride: 110 mmol/L (ref 98–111)
Creatinine, Ser: 2.06 mg/dL — ABNORMAL HIGH (ref 0.44–1.00)
GFR, Estimated: 24 mL/min — ABNORMAL LOW (ref >60)
Glucose, Bld: 197 mg/dL — ABNORMAL HIGH (ref 70–99)
Potassium: 3.7 mmol/L (ref 3.5–5.1)
Sodium: 141 mmol/L (ref 135–145)

## 2022-03-15 LAB — VITAMIN E
Vitamin E (Alpha Tocopherol): 9.3 mg/L (ref 9.0–29.0)
Vitamin E(Gamma Tocopherol): 0.5 mg/L (ref 0.5–4.9)

## 2022-03-15 LAB — PHOSPHORUS: Phosphorus: 5.9 mg/dL — ABNORMAL HIGH (ref 2.5–4.6)

## 2022-03-15 LAB — ANTI-DNA ANTIBODY, DOUBLE-STRANDED: ds DNA Ab: <1 IU/mL (ref 0–9)

## 2022-03-15 LAB — SJOGRENS SYNDROME-B EXTRACTABLE NUCLEAR ANTIBODY: SSB (La) (ENA) Antibody, IgG: <0.2 AI (ref 0.0–0.9)

## 2022-03-15 LAB — MAGNESIUM: Magnesium: 2.8 mg/dL — ABNORMAL HIGH (ref 1.7–2.4)

## 2022-03-15 LAB — ANTI-DNASE B ANTIBODY: Anti-DNAse-B: <78 U/mL (ref 0–120)

## 2022-03-15 LAB — ANTI-JO 1 ANTIBODY, IGG: Anti JO-1: <0.2 AI (ref 0.0–0.9)

## 2022-03-15 LAB — CK: Total CK: 99 U/L (ref 38–234)

## 2022-03-15 LAB — ANA W/REFLEX IF POSITIVE: Anti Nuclear Antibody (ANA): NEGATIVE

## 2022-03-15 LAB — SJOGRENS SYNDROME-A EXTRACTABLE NUCLEAR ANTIBODY: SSA (Ro) (ENA) Antibody, IgG: <0.2 AI (ref 0.0–0.9)

## 2022-03-15 LAB — ANTI-SMITH ANTIBODY: ENA SM Ab Ser-aCnc: <0.2 AI (ref 0.0–0.9)

## 2022-03-15 MED ORDER — FENTANYL CITRATE (PF) 100 MCG/2ML IJ SOLN
25.0000 ug | Freq: Once | INTRAMUSCULAR | Status: AC
  Administered 2022-03-15: 25 ug via INTRAVENOUS

## 2022-03-15 MED ORDER — FENTANYL 2500MCG IN NS 250ML (10MCG/ML) PREMIX INFUSION
25.0000 ug/h | INTRAVENOUS | Status: DC
  Administered 2022-03-15: 25 ug/h via INTRAVENOUS
  Filled 2022-03-15 (×3): qty 250

## 2022-03-15 MED ORDER — SODIUM CHLORIDE 0.9 % IV SOLN
3.0000 g | Freq: Two times a day (BID) | INTRAVENOUS | Status: DC
  Administered 2022-03-15 – 2022-03-16 (×4): 3 g via INTRAVENOUS
  Filled 2022-03-15 (×4): qty 8

## 2022-03-15 MED ORDER — CHLORHEXIDINE GLUCONATE 0.12% ORAL RINSE (MEDLINE KIT)
15.0000 mL | Freq: Two times a day (BID) | OROMUCOSAL | Status: DC
  Administered 2022-03-15 – 2022-03-31 (×28): 15 mL via OROMUCOSAL

## 2022-03-15 MED ORDER — FENTANYL CITRATE (PF) 100 MCG/2ML IJ SOLN
INTRAMUSCULAR | Status: AC
  Administered 2022-03-15: 100 ug
  Filled 2022-03-15: qty 4

## 2022-03-15 MED ORDER — FAMOTIDINE 20 MG PO TABS
20.0000 mg | ORAL_TABLET | Freq: Every day | ORAL | Status: DC
  Administered 2022-03-15 – 2022-03-24 (×10): 20 mg
  Filled 2022-03-15 (×10): qty 1

## 2022-03-15 MED ORDER — PROPOFOL 1000 MG/100ML IV EMUL
5.0000 ug/kg/min | INTRAVENOUS | Status: DC
  Administered 2022-03-15 – 2022-03-16 (×2): 20 ug/kg/min via INTRAVENOUS
  Filled 2022-03-15 (×3): qty 100

## 2022-03-15 MED ORDER — ROCURONIUM BROMIDE 10 MG/ML (PF) SYRINGE
PREFILLED_SYRINGE | INTRAVENOUS | Status: AC
  Administered 2022-03-15: 100 mg
  Filled 2022-03-15: qty 10

## 2022-03-15 MED ORDER — SODIUM CHLORIDE 0.9% FLUSH: 10.0000 mL | INTRAVENOUS | Status: DC | PRN

## 2022-03-15 MED ORDER — ETOMIDATE 2 MG/ML IV SOLN
INTRAVENOUS | Status: AC
  Administered 2022-03-15: 20 mg
  Filled 2022-03-15: qty 10

## 2022-03-15 MED ORDER — POLYETHYLENE GLYCOL 3350 17 G PO PACK
17.0000 g | PACK | Freq: Every day | ORAL | Status: DC
  Administered 2022-03-15 – 2022-03-16 (×2): 17 g
  Filled 2022-03-15 (×3): qty 1

## 2022-03-15 MED ORDER — SODIUM CHLORIDE 0.9% FLUSH
10.0000 mL | Freq: Two times a day (BID) | INTRAVENOUS | Status: DC
  Administered 2022-03-15 – 2022-03-16 (×3): 30 mL
  Administered 2022-03-16 – 2022-03-19 (×7): 10 mL

## 2022-03-15 MED ORDER — LACTATED RINGERS IV SOLN: INTRAVENOUS | Status: DC

## 2022-03-15 MED ORDER — ORAL CARE MOUTH RINSE
15.0000 mL | OROMUCOSAL | Status: DC
  Administered 2022-03-15 – 2022-03-17 (×23): 15 mL via OROMUCOSAL

## 2022-03-15 MED ORDER — DOCUSATE SODIUM 50 MG/5ML PO LIQD
100.0000 mg | Freq: Two times a day (BID) | ORAL | Status: DC
  Administered 2022-03-15 – 2022-03-16 (×3): 100 mg
  Filled 2022-03-15 (×5): qty 10

## 2022-03-15 MED ORDER — FENTANYL BOLUS VIA INFUSION
25.0000 ug | INTRAVENOUS | Status: DC | PRN
  Administered 2022-03-15: 100 ug via INTRAVENOUS
  Administered 2022-03-15: 50 ug via INTRAVENOUS
  Administered 2022-03-15 – 2022-03-16 (×2): 100 ug via INTRAVENOUS
  Administered 2022-03-16 (×3): 50 ug via INTRAVENOUS
  Administered 2022-03-16: 100 ug via INTRAVENOUS
  Administered 2022-03-16 – 2022-03-17 (×3): 50 ug via INTRAVENOUS
  Administered 2022-03-17: 100 ug via INTRAVENOUS
  Filled 2022-03-15: qty 100

## 2022-03-15 MED ORDER — LIDOCAINE HCL (PF) 1 % IJ SOLN
INTRAMUSCULAR | Status: AC
  Filled 2022-03-15: qty 5

## 2022-03-15 MED ORDER — SODIUM CHLORIDE 0.9 % IV SOLN
INTRAVENOUS | Status: DC | PRN
Start: 2022-03-15 — End: 2022-03-31

## 2022-03-15 MED ORDER — POLYVINYL ALCOHOL 1.4 % OP SOLN
1.0000 [drp] | OPHTHALMIC | Status: DC | PRN
  Administered 2022-03-15 – 2022-03-21 (×8): 1 [drp] via OPHTHALMIC
  Filled 2022-03-15 (×3): qty 15

## 2022-03-15 MED ORDER — FENTANYL CITRATE (PF) 100 MCG/2ML IJ SOLN: 25.0000 ug | INTRAMUSCULAR | Status: DC | PRN

## 2022-03-15 MED ORDER — LIDOCAINE HCL (PF) 2 % IJ SOLN
0.0000 mL | Freq: Once | INTRAMUSCULAR | Status: DC | PRN
  Filled 2022-03-15: qty 20

## 2022-03-15 NOTE — Progress Notes (Signed)
IP rehab admissions - Noted PT recommending inpatient rehab admission on evaluation.  However, patient now intubated.  Once patient is extubated and more medically stable, can reconsider potential acute inpatient rehab admission.  Call for questions.  [phone] ?

## 2022-03-15 NOTE — Procedures (Signed)
After informed written consent was obtained, using Betadine for cleansing and 1% Lidocaine with epinephrine for anesthetic, with sterile technique a 2 mm punch biopsy was used to obtain a biopsy specimen of a raised lesion on the right anterior upper arm. Hemostasis was obtained by pressure and wound was sutured. This procedure was repeated on the posterior aspect of the left upper thigh, at the edge of a raised purpuric lesion. The specimens were labeled and sent to pathology for evaluation. The procedure was well tolerated without complications. ? ?Mitzi Hansen, MD ? ?

## 2022-03-15 NOTE — Progress Notes (Signed)
Peripherally Inserted Central Catheter Placement ? ?The IV Nurse has discussed with the patient and/or persons authorized to consent for the patient, the purpose of this procedure and the potential benefits and risks involved with this procedure.  The benefits include less needle sticks, lab draws from the catheter, and the patient may be discharged home with the catheter. Risks include, but not limited to, infection, bleeding, blood clot (thrombus formation), and puncture of an artery; nerve damage and irregular heartbeat and possibility to perform a PICC exchange if needed/ordered by physician.  Alternatives to this procedure were also discussed.  Bard Power PICC patient education guide, fact sheet on infection prevention and patient information card has been provided to patient /or left at bedside.  Husband and dtr at bedside signed consent.  All questions answered to satisfaction. ? ?PICC Placement Documentation  ?PICC Triple Lumen 123XX123 Right Basilic 40 cm 0 cm (Active)  ?Indication for Insertion or Continuance of Line Vasoactive infusions;Administration of hyperosmolar/irritating solutions (i.e. TPN, Vancomycin, etc.);Chronic illness with exacerbations (CF, Sickle Cell, etc.);Prolonged intravenous therapies;Poor Vasculature-patient has had multiple peripheral attempts or PIVs lasting less than 24 hours;Limited venous access - need for IV therapy >5 days (PICC only) 03/15/22 1453  ?Exposed Catheter (cm) 0 cm 03/15/22 1453  ?Site Assessment Clean, Dry, Intact 03/15/22 1453  ?Lumen #1 Status Flushed;Saline locked;Blood return noted 03/15/22 1453  ?Lumen #2 Status Flushed;Saline locked;Blood return noted 03/15/22 1453  ?Lumen #3 Status Flushed;Saline locked;Blood return noted 03/15/22 1453  ?Dressing Type Securing device;Transparent 03/15/22 1453  ?Dressing Status Clean, Dry, Intact 03/15/22 1453  ?Safety Lock Not Applicable [id number]  ?Line Care Connections checked and tightened 03/15/22 1453  ?Line  Adjustment (NICU/IV Team Only) No 03/15/22 1453  ?Dressing Intervention New dressing 03/15/22 1453  ?Dressing Change Due 03/22/22 03/15/22 1453  ? ? ? ? ? ?Rolena Infante ?03/15/2022, 2:54 PM ? ?

## 2022-03-15 NOTE — Plan of Care (Signed)
?  Problem: Health Behavior/Discharge Planning: ?Goal: Ability to manage health-related needs will improve ?Outcome: Not Progressing ?  ?Problem: Education: ?Goal: Knowledge of General Education information will improve ?Description: Including pain rating scale, medication(s)/side effects and non-pharmacologic comfort measures ?Outcome: Not Progressing ?  ?Problem: Clinical Measurements: ?Goal: Diagnostic test results will improve ?Outcome: Progressing ?Goal: Respiratory complications will improve ?Outcome: Not Progressing ?  ?Problem: Activity: ?Goal: Risk for activity intolerance will decrease ?Outcome: Not Progressing ?  ?

## 2022-03-15 NOTE — Progress Notes (Signed)
Chart noted to have an upper extremity ultrasound scan due today. Assessed vessels in right arm with good compressibility (cephalic, brachial, and basilic veins). Vessels black in color within lumen and no discolorations/abnormalities noted.  No edema noted in extremity. Pulses strong and pink in color. Secure chat sent to Dr.Daniel Katrinka Blazing regarding vein compressibility and occupancy. Dr. Katrinka Blazing stated to continue with PICC placement. Rash noted over entire proximal upper arm. PICC inserted where skin integrity is intact without s/s of rash or break in skin. ?

## 2022-03-15 NOTE — Progress Notes (Signed)
SLP Cancellation Note ? ?Patient Details ?Name: Lauren Henry ?MRN: [id number] ?DOB: 08/31/1941 ? ? ?Cancelled treatment:       Reason Eval/Treat Not Completed: Medical issues which prohibited therapy;Other (comment) (patient has been intubated. SLP will f/u for patient's readiness) ? ?Angela Nevin, MA, CCC-SLP ?Speech Therapy ? ?

## 2022-03-15 NOTE — Progress Notes (Signed)
PCCM called to bedside for patient having increased WOB. Requiring increased O2 and abdominal breathing. ABG 7.25, co2 50. CXR showing b/l  interstitial changes. Possible aspiration. Will intubate patient. Attempted to call husband over phone to update but unable to reach. Will start unasyn for asp ppx and obtain trach culture. ? ?Patsy Lager, PA-C ?[facility] ?03/15/2022, 12:14 AM ? ?Please see [email or web address] for pager details. ? ?From 7A-7P if no response, please call [phone]. ?After hours, please call ELink [phone]. ? ?

## 2022-03-15 NOTE — Progress Notes (Signed)
Upper extremity venous attempted again today. Patient bedside procedure. Will attempt again as schedule and patient availability permits.  ? ?Jean Rosenthal, RDMS, RVT ? ?

## 2022-03-15 NOTE — Procedures (Signed)
Name: Lauren Henry ?MRN: [id number] ?DOB: 01-Oct-1941 ? ? ?PROCEDURE NOTE ? ?Procedure:  Endotracheal intubation. ? ?Indication:  Acute respiratory failure ? ?Consent:  Consent was implied due to the emergency nature of the procedure. ? ?Anesthesia:  A total of 10 mg of Etomidate was given intravenously. ? ?Procedure summary:  Appropriate equipment was assembled. The patient was identified as Lauren Henry and safety timeout was performed. The patient was placed supine, with head in sniffing position. After adequate level of anesthesia was achieved, a 4 blade was inserted into the oropharynx and the vocal cords were visualized. A 7.5  endotracheal tube was inserted without difficulty and visualized going through the vocal cords. The stylette was removed and cuff inflated. Colorimetric change was noted on the CO2 meter. Breath sounds were heard over both lung fields equally. ETT was secured at 25 lip line.  Post procedure chest xray was ordered. ? ?Complications:  No immediate complications were noted.  Hemodynamic parameters and oxygenation remained stable throughout the procedure.   ? ?Difficult airway , Angioedema ? ? ?03/15/2022, 12:15 AM  ?

## 2022-03-15 NOTE — Consult Note (Addendum)
? ?NAME:  Lauren Henry, MRN:  [id number], DOB:  1941/09/09, LOS: 1 ?ADMISSION DATE:  03/13/2022, CONSULTATION DATE:  03/14/22 ?REFERRING MD:  Elgergawy CHIEF COMPLAINT:  Facial swelling, angioedema.  ? ?History of Present Illness:  ?Lauren Henry is a 81 y.o. female who has a PMH as outlined below.  She presented to [facility] ED 3/23 with left eye swelling that began the day prior.  Husband also noticed a rash to her forehead and face that began roughly around the same time.  Husband states that rash was erythematous and raised (see photos below).  Per chart review, she had recent hospitalization 3/2 through 3/5 for syncope secondary to UTI.  She was taken off blood pressure medications and treated with antibiotics.  She was then again hospitalized 3/17 through 3/19 for failure to thrive.  Husband states ever since then, she has had a progressive decline and in fact she has had 30 pound weight loss dating back to Christmas 2022.  CT scan of the chest/abdomen/pelvis was obtained to rule out an underlying malignancy and this only revealed a 8 mm noncalcified nodule in the right lower lobe for which repeat CT was recommended in 2 to 3 months. ? ?In regards to her current presentation, husband reported that over the last week, speech had become slightly slurred which coincided with neck swelling as well as dysphagia with coughing while eating.  She apparently was given B12 and B1 per husband 3/22 and rash started shortly thereafter. ? ?3/23 swelling worsened and she had slight difficulty in breathing; therefore, EMS was called. ? ?Morning of 3/24, facial swelling worsened and she started to have tongue swelling along with tachypnea and difficulty breathing.  PCCM was subsequently called in consultation due to concern for worsening angioedema. ? ?At the time of our evaluation, she has edematous tongue; however, her posterior pharynx is open.  She is able to speak in 3-4 word sentences.  She has been transferred to the ICU for  close monitoring and observation.  She received 10 mg Decadron, 20 mg Pepcid, 50 mg Benadryl as well as IM epinephrine. ? 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; Knee pain; Right-sided low back pain with right-sided sciatica; Elevated troponin with new LBBB; Chronic anticoagulation; CAD in native artery; Chronic renal disease, stage III ([place]); Anemia; Nausea vomiting and diarrhea; AKI (acute kidney injury) ([place]); Gastroenteropathy; Weakness; Orthostatic hypotension; Glaucoma; Anxiety disorder; Postural dizziness with presyncope; Syncope and collapse; UTI (urinary tract infection); Syncope; Stage 3b chronic kidney disease (CKD) ([place]); Failure to thrive in adult; Parkinsonian features; Microscopic hematuria; Protein-calorie malnutrition, severe; Acute kidney injury superimposed on chronic kidney disease ([place]); Neck and right arm swelling; Rash; Dysphagia; Swelling of right upper extremity; Eye swelling, left; Chronic diastolic CHF (congestive heart failure) ([place]); and Angio-edema on their problem list. ? ? ?Significant Hospital Events: ?Including procedures, antibiotic start and stop dates in addition to other pertinent events   ?3/23 admit. ?3/24 ?angioedema, PCCM consult, transfer to ICU. ?3/25 intubated ? ?Interim History / Subjective:  ?Intubated overnight for worsened WOB, hypercarbia. ? ?Progressive rash over trunk and back (see photos below). ? ?Objective:  ?Blood pressure 126/77, pulse 91, temperature (!)  97.5 ?F (36.4 ?C), temperature source Oral, resp. rate (!) 26,  height 5\' 2"  (1.575 m), weight 78.9 kg, SpO2 98 %. ?   ?Vent Mode: PRVC ?FiO2 (%):  [40 %-100 %] 40 % ?Set Rate:  [16 bmp-26 bmp] 26 bmp ?Vt Set:  [380 mL] 380 mL ?PEEP:  [5 cmH20] 5 cmH20 ?Plateau Pressure:  [15 cmH20-17 cmH20] 17 cmH20  ? ?Intake/Output Summary (Last 24 hours) at 03/15/2022 0839 ?Last data filed at 03/15/2022 0700 ?Gross per 24 hour  ?Intake 843.61 ml  ?Output 687 ml  ?Net 156.61 ml  ? ? ?Filed Weights  ? 03/13/22 0900 03/14/22 0500 03/15/22 0437  ?Weight: 76.7 kg 78.8 kg 78.9 kg  ? ? ?Examination: ?Intubated, sedated ?Papular rash on face/chest, purpuric rash on back and legs (photos) ?There is eyelid swelling, some teeth marks on anterior tongue but I see no signs of mucosal involvement ?Lungs clear ?Abdomen benign ?Ext warm ? ?CBC stable ?Renal function stable ?BUN rising from steroids ? ?Labs/imaging personally reviewed:  ?CT soft tissue neck 3/23 > enlarged tonsils bilaterally, moderate left periorbital edema without extension into the orbit. ? ?Assessment & Plan:  ?Appears to be two separate rashes- original papular rash and facial swelling starting in face.  Now developing purpura in LE and back.  Question allergic reaction + IgA vasculitis.  Strep infection + clinda reaction ties this all together. ? ?Acute hypercarbic respiratory failure due to systemic allergic reaction, oropharyngeal swelling, question aspiration now on vent  ? ?Hx hypothyroid, PAF, dCHF, CKD- TSH ok, looks volume down ?Hyperreflexia, weakness, dysphagia- neuro following, ?CT myelogram at some point, has MRI non-compatible PPM ? ?- Continue thiamine/folate ?- Continue steroids, pepcid, benadryl ?- Punch biopsy x 2 (one papular rash, one purpuric) ?- f/u remaining rheum labs (nonspecific to date) ?- Continue synthroid ?- Vent support, VAP prevention bundle, empiric unasyn ?- Further CNS imaging per neuro; eliquis on hold in case needs myelogram ? ?Best practice (evaluated daily):  ?Diet/type: TF ?DVT prophylaxis:  lovenox ?GI prophylaxis: H2B ?Lines: N/A ?Foley:  N/A ?Code Status:  full code ?Last date of multidisciplinary goals of care discussion: will update today ? ?45 min cc time independent of procedures ?Erskine Emery MD PCCM ? ? ?

## 2022-03-15 NOTE — Progress Notes (Signed)
Elink notified at 0000 d/t patients increased WOB and increased O2 requirements. Ground team called to bedside and patient was emergently intubated by Dr. Catha Gosselin with RT and nurse at bedside [phone]). 100 Fentanyl, 20 etomidate, and 100 Roc given. Intubation successful. Attempt to call patients husband with no success. Fentanyl drip started and patient is now resting comfortably in bed. Will continue to monitor.  ?

## 2022-03-16 ENCOUNTER — Inpatient Hospital Stay (HOSPITAL_COMMUNITY): Payer: Medicare PPO

## 2022-03-16 DIAGNOSIS — J9601 Acute respiratory failure with hypoxia: Secondary | ICD-10-CM | POA: Diagnosis not present

## 2022-03-16 DIAGNOSIS — H5789 Other specified disorders of eye and adnexa: Secondary | ICD-10-CM | POA: Diagnosis not present

## 2022-03-16 LAB — CBC
HCT: 20.6 % — ABNORMAL LOW (ref 36.0–46.0)
HCT: 22.4 % — ABNORMAL LOW (ref 36.0–46.0)
Hemoglobin: 6.4 g/dL — CL (ref 12.0–15.0)
Hemoglobin: 6.9 g/dL — CL (ref 12.0–15.0)
MCH: 29.6 pg (ref 26.0–34.0)
MCH: 29.2 pg (ref 26.0–34.0)
MCHC: 31.1 g/dL (ref 30.0–36.0)
MCHC: 30.8 g/dL (ref 30.0–36.0)
MCV: 95.4 fL (ref 80.0–100.0)
MCV: 94.9 fL (ref 80.0–100.0)
Platelets: 121 10*3/uL — ABNORMAL LOW (ref 150–400)
Platelets: 133 10*3/uL — ABNORMAL LOW (ref 150–400)
RBC: 2.16 MIL/uL — ABNORMAL LOW (ref 3.87–5.11)
RBC: 2.36 MIL/uL — ABNORMAL LOW (ref 3.87–5.11)
RDW: 15.9 % — ABNORMAL HIGH (ref 11.5–15.5)
RDW: 16 % — ABNORMAL HIGH (ref 11.5–15.5)
WBC: 4.4 10*3/uL (ref 4.0–10.5)
WBC: 4.9 10*3/uL (ref 4.0–10.5)
nRBC: 0.5 % — ABNORMAL HIGH (ref 0.0–0.2)
nRBC: 0 % (ref 0.0–0.2)

## 2022-03-16 LAB — ALDOLASE: Aldolase: 18.2 U/L — ABNORMAL HIGH (ref 3.3–10.3)

## 2022-03-16 LAB — BASIC METABOLIC PANEL
Anion gap: 10 (ref 5–15)
BUN: 73 mg/dL — ABNORMAL HIGH (ref 8–23)
CO2: 21 mmol/L — ABNORMAL LOW (ref 22–32)
Calcium: 9 mg/dL (ref 8.9–10.3)
Chloride: 111 mmol/L (ref 98–111)
Creatinine, Ser: 1.8 mg/dL — ABNORMAL HIGH (ref 0.44–1.00)
GFR, Estimated: 28 mL/min — ABNORMAL LOW (ref >60)
Glucose, Bld: 195 mg/dL — ABNORMAL HIGH (ref 70–99)
Potassium: 4 mmol/L (ref 3.5–5.1)
Sodium: 142 mmol/L (ref 135–145)

## 2022-03-16 LAB — PREPARE RBC (CROSSMATCH)

## 2022-03-16 LAB — PHOSPHORUS: Phosphorus: 4.1 mg/dL (ref 2.5–4.6)

## 2022-03-16 LAB — TRIGLYCERIDES: Triglycerides: 344 mg/dL — ABNORMAL HIGH (ref <150)

## 2022-03-16 LAB — HEMOGLOBIN AND HEMATOCRIT, BLOOD
HCT: 32.2 % — ABNORMAL LOW (ref 36.0–46.0)
Hemoglobin: 10 g/dL — ABNORMAL LOW (ref 12.0–15.0)

## 2022-03-16 LAB — GLUCOSE, CAPILLARY
Glucose-Capillary: 115 mg/dL — ABNORMAL HIGH (ref 70–99)
Glucose-Capillary: 140 mg/dL — ABNORMAL HIGH (ref 70–99)
Glucose-Capillary: 154 mg/dL — ABNORMAL HIGH (ref 70–99)
Glucose-Capillary: 155 mg/dL — ABNORMAL HIGH (ref 70–99)
Glucose-Capillary: 173 mg/dL — ABNORMAL HIGH (ref 70–99)
Glucose-Capillary: 174 mg/dL — ABNORMAL HIGH (ref 70–99)
Glucose-Capillary: 183 mg/dL — ABNORMAL HIGH (ref 70–99)

## 2022-03-16 LAB — PARATHYROID HORMONE, INTACT (NO CA): PTH: 29 pg/mL (ref 15–65)

## 2022-03-16 LAB — IGA: IgA: 181 mg/dL (ref 64–422)

## 2022-03-16 LAB — MAGNESIUM: Magnesium: 2.1 mg/dL (ref 1.7–2.4)

## 2022-03-16 MED ORDER — SODIUM CHLORIDE 0.9% IV SOLUTION: Freq: Once | INTRAVENOUS | Status: DC

## 2022-03-16 MED ORDER — MIDAZOLAM HCL 2 MG/2ML IJ SOLN
2.0000 mg | INTRAMUSCULAR | Status: DC | PRN
  Administered 2022-03-16: 2 mg via INTRAVENOUS
  Filled 2022-03-16: qty 2

## 2022-03-16 MED ORDER — SODIUM CHLORIDE 0.9 % IV SOLN
2.0000 g | INTRAVENOUS | Status: DC
  Administered 2022-03-16 – 2022-03-18 (×3): 2 g via INTRAVENOUS
  Filled 2022-03-16 (×3): qty 2

## 2022-03-16 MED ORDER — FENTANYL 2500MCG IN NS 250ML (10MCG/ML) PREMIX INFUSION
0.0000 ug/h | INTRAVENOUS | Status: DC
  Administered 2022-03-16: 225 ug/h via INTRAVENOUS
  Administered 2022-03-16: 200 ug/h via INTRAVENOUS
  Administered 2022-03-17: 225 ug/h via INTRAVENOUS
  Filled 2022-03-16 (×2): qty 250

## 2022-03-16 MED ORDER — FREE WATER
200.0000 mL | Freq: Three times a day (TID) | Status: DC
  Administered 2022-03-16 – 2022-03-25 (×26): 200 mL

## 2022-03-16 NOTE — Progress Notes (Signed)
Pharmacy Antibiotic Note ? ?[name] is a 81 y.o. female admitted on 03/13/2022 with pneumonia.  Pharmacy has been consulted for Cefepime dosing. Patient with papular and purpuric rash, fever, and concern of pneumonia. Due to GNR on stain in TA and skin changes, broadening coverage for pseudomonas.  ? ?Plan: ?Cefepime 2g IV every 24 hours.  ?Monitor clinical status, culture results, and renal function.  ? ?Height: 5\' 2"  (157.5 cm) ?Weight: 76.2 kg (167 lb 15.9 oz) ?IBW/kg (Calculated) : 50.1 ? ?Temp (24hrs), Avg:97.6 ?F (36.4 ?C), Min:97.3 ?F (36.3 ?C), Max:98 ?F (36.7 ?C) ? ?Recent Labs  ?Lab 03/13/22 ?03/15/22 03/14/22 ?0022 03/15/22 ?0144 03/15/22 ?03/17/22 03/16/22 ?03/18/22 03/16/22 ?03/18/22  ?WBC 7.3 3.3* 5.2 4.7 4.4 4.9  ?CREATININE 2.05* 1.93* 2.06*  --  1.80*  --   ?  ?Estimated Creatinine Clearance: 23.8 mL/min (A) (by C-G formula based on SCr of 1.8 mg/dL (H)).   ? ?Allergies  ?Allergen Reactions  ? Clindamycin/Lincomycin Swelling  ?  Concern of angioedema  ? Methimazole Other (See Comments)  ?  Elevated LFT's per [facility]   ? Olmesartan Other (See Comments)  ?  enteritis  ? Amlodipine Swelling and Rash  ? ? ?Antimicrobials this admission: ?Clinda 3/23 >>3/24 (allergic reaction suspected, added to allergies) ?Unasyn 3/24>>3/26 ?Cefepime 3/26 >> ? ?Dose adjustments this admission: ? ? ?Microbiology results: ?3/23 GAS PCR neg  ?3/23 RVP -negative ?3/24 RCx >> (stain GNR) ?3/25 Strep cx >> ? ?Thank you for allowing pharmacy to be a part of this patient?s care. ? ?4/25, PharmD ?Clinical Pharmacist ?Please refer to [facility] for [facility] Pharmacy numbers ?03/16/2022 11:50 AM ? ?

## 2022-03-16 NOTE — Progress Notes (Signed)
Called patients spouse, Zahraa Bhargava, to obtain blood consent. No answer. Husband gets here at the start of visiting hours which is 0700. Will get consent from him then. Non-emergent.  ?

## 2022-03-16 NOTE — Progress Notes (Signed)
eLink Physician-Brief Progress Note ?Patient Name: ADRIENNE GISI ?DOB: 12-Dec-1941 ?MRN: [id number] ? ? ?Date of Service ? 03/16/2022  ?HPI/Events of Note ? Triglycerides = 344.  ?eICU Interventions ? Plan: ?D/C Propofol IV infusion. ?Titrate Fentanyl IV infusion to RASS to 0 to -1.  ? ? ? ?Intervention Category ?Major Interventions: Other: ? ?Damaria Stofko Cornelia Copa ?03/16/2022, 3:45 AM ?

## 2022-03-16 NOTE — Progress Notes (Signed)
eLink Physician-Brief Progress Note ?Patient Name: TENAE GRAZIOSI ?DOB: 1941/03/16 ?MRN: [id number] ? ? ?Date of Service ? 03/16/2022  ?HPI/Events of Note ? Anemia - Hgb = 6.4..   ?eICU Interventions ? Will transfuse 1 unit PRBC.  ? ? ? ?Intervention Category ?Major Interventions: Other: ? ?Deven Furia Dennard Nip ?03/16/2022, 3:08 AM ?

## 2022-03-16 NOTE — Progress Notes (Signed)
SLP Cancellation Note ? ?Patient Details ?Name: MAURYA NETHERY ?MRN: [id number] ?DOB: 1941/01/10 ? ? ?Cancelled treatment:       Reason Eval/Treat Not Completed: Patient not medically ready (remains on vent this am)/ ? ? ? ?Mahala Menghini., M.A. CCC-SLP ?Acute Rehabilitation Services ?Pager [phone] ?Office [phone] ? ?03/16/2022, 7:26 AM ?

## 2022-03-16 NOTE — Progress Notes (Signed)
eLink Physician-Brief Progress Note ?Patient Name: Lauren Henry ?DOB: 1941-02-05 ?MRN: [id number] ? ? ?Date of Service ? 03/16/2022  ?HPI/Events of Note ? Multiple loose stools - Nursing request for [facility] for skin protection.  ?eICU Interventions ? Plan: ?Place Flexiseal.   ? ? ? ?Intervention Category ?Major Interventions: Other: ? ?Lauren Henry ?03/16/2022, 9:36 PM ?

## 2022-03-16 NOTE — Progress Notes (Signed)
Date and time results received: 03/16/22 0255 ?(use smartphrase ".now" to insert current time) ? ?Test: HGB ?Critical Value: 6.4 ? ?Name of Provider Notified: Elink ? ?Orders Received? Or Actions Taken?: Orders Received - See Orders for details ?

## 2022-03-16 NOTE — Progress Notes (Signed)
? ?NAME:  Lauren Henry, MRN:  [id number], DOB:  06-27-1941, LOS: 2 ?ADMISSION DATE:  03/13/2022, CONSULTATION DATE:  03/14/22 ?REFERRING MD:  Elgergawy CHIEF COMPLAINT:  Facial swelling, angioedema.  ? ?History of Present Illness:  ?Lauren Henry is a 81 y.o. female who has a PMH as outlined below.  She presented to [facility] ED 3/23 with left eye swelling that began the day prior.  Husband also noticed a rash to her forehead and face that began roughly around the same time.  Husband states that rash was erythematous and raised (see photos below).  Per chart review, she had recent hospitalization 3/2 through 3/5 for syncope secondary to UTI.  She was taken off blood pressure medications and treated with antibiotics.  She was then again hospitalized 3/17 through 3/19 for failure to thrive.  Husband states ever since then, she has had a progressive decline and in fact she has had 30 pound weight loss dating back to Christmas 2022.  CT scan of the chest/abdomen/pelvis was obtained to rule out an underlying malignancy and this only revealed a 8 mm noncalcified nodule in the right lower lobe for which repeat CT was recommended in 2 to 3 months. ? ?In regards to her current presentation, husband reported that over the last week, speech had become slightly slurred which coincided with neck swelling as well as dysphagia with coughing while eating.  She apparently was given B12 and B1 per husband 3/22 and rash started shortly thereafter. ? ?3/23 swelling worsened and she had slight difficulty in breathing; therefore, EMS was called. ? ?Morning of 3/24, facial swelling worsened and she started to have tongue swelling along with tachypnea and difficulty breathing.  PCCM was subsequently called in consultation due to concern for worsening angioedema. ? ?At the time of our evaluation, she has edematous tongue; however, her posterior pharynx is open.  She is able to speak in 3-4 word sentences.  She has been transferred to the ICU for  close monitoring and observation.  She received 10 mg Decadron, 20 mg Pepcid, 50 mg Benadryl as well as IM epinephrine. ? 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; Knee pain; Right-sided low back pain with right-sided sciatica; Elevated troponin with new LBBB; Chronic anticoagulation; CAD in native artery; Chronic renal disease, stage III ([place]); Anemia; Nausea vomiting and diarrhea; AKI (acute kidney injury) ([place]); Gastroenteropathy; Weakness; Orthostatic hypotension; Glaucoma; Anxiety disorder; Postural dizziness with presyncope; Syncope and collapse; UTI (urinary tract infection); Syncope; Stage 3b chronic kidney disease (CKD) ([place]); Failure to thrive in adult; Parkinsonian features; Microscopic hematuria; Protein-calorie malnutrition, severe; Acute kidney injury superimposed on chronic kidney disease ([place]); Neck and right arm swelling; Rash; Dysphagia; Swelling of right upper extremity; Eye swelling, left; Chronic diastolic CHF (congestive heart failure) ([place]); and Angio-edema on their problem list. ? ? ?Significant Hospital Events: ?Including procedures, antibiotic start and stop dates in addition to other pertinent events   ?3/23 admit. ?3/24 ?angioedema, PCCM consult, transfer to ICU. ?3/25 intubated, unasyn, skin biopsy ? ?Interim History / Subjective:  ?No events. ?Rash seems stable. ?HD stable. ?RASS -1. ? ?Objective:  ?Blood pressure 123/66, pulse 64, temperature (!) 97.5 ?F (36.4 ?  C), temperature source Oral, resp. rate (!) 22, height 5\' 2"  (1.575 m), weight 76.2 kg,  SpO2 100 %. ?   ?Vent Mode: PRVC ?FiO2 (%):  [40 %] 40 % ?Set Rate:  [26 bmp-29 bmp] 26 bmp ?Vt Set:  [380 mL] 380 mL ?PEEP:  [5 cmH20-58 cmH20] 5 cmH20 ?Plateau Pressure:  [11 cmH20-17 cmH20] 16 cmH20  ? ?Intake/Output Summary (Last 24 hours) at 03/16/2022 0803 ?Last data filed at 03/16/2022 0700 ?Gross per 24 hour  ?Intake 4458.36 ml  ?Output 515 ml  ?Net 3943.36 ml  ? ? ?Filed Weights  ? 03/15/22 0437 03/16/22 0017 03/16/22 0114  ?Weight: 78.9 kg 76.2 kg 76.2 kg  ? ? ?Examination: ?Intubated and sedated but will open eyes to voice, follow commands with enough prompting ?Periorbital swelling looks a bit better to me ?Firm tender submandibular swelling bilateral, stable ?Ext warm ?Raised papular rash on trunk scabbing over, does not appear to be spreading ?Purpura on legs and back stable ?Yellow urine ?Lungs clear ? ?H/H down a couple points, getting a unit of blood, no signs of bleeding ?Cr stable, BUN rising probably from steroids ?TG high, Prop dc'd now on fent alone ? ?Labs/imaging personally reviewed:  ?CT soft tissue neck 3/23 > enlarged tonsils bilaterally, moderate left periorbital edema without extension into the orbit. ? ?Assessment & Plan:  ?Appears to be two separate rashes- original papular rash and facial swelling.  Then developed purpura in LE and back.  Question strep then clinda allergic reaction + Strep associated IgA vasculitis.   ? ?Acute hypercarbic respiratory failure due to systemic allergic reaction, oropharyngeal swelling, question aspiration now on vent  ? ?Hx hypothyroid, PAF, dCHF, CKD- TSH ok, volume status looks better ? ?Acute on chronic renal injury improved with fluids ? ?Hyperreflexia, weakness, dysphagia- neuro following, ?CT myelogram at some point, has MRI non-compatible PPM; would prefer to just re-evaluate once off vent ? ?New anemia- no s/s of bleeding, did get fair bit of fluids wonder if at least some dilutional + phlebotomy ? ?- Continue thiamine/folate ?- Continue steroids,  pepcid, benadryl ?- f/u skin path from biopsy 3/25 ?- f/u remaining rheum labs (nonspecific to date) ?- follow up strep culture ?- Continue synthroid ?- Vent support, VAP prevention bundle, empiric unasyn.  May try SAT/SBT tomorrow. ?- Further CNS imaging per neuro; eliquis on hold in case needs myelogram ?- 1 unit blood, monitor for s/s of bleeding; elevated BUN I am hoping is just from steroids but has good risk of UGIB, keep an eye on Bms, continue ppx lovenox for now ? ?Best practice (evaluated daily):  ?Diet/type: TF ?DVT prophylaxis: lovenox ?GI prophylaxis: H2B ?Lines: N/A ?Foley:  N/A ?Code Status:  full code ?Last date of multidisciplinary goals of care discussion: will update today ? ?33 min cc time independent of procedures ? ?Erskine Emery MD PCCM ? ? ?

## 2022-03-16 NOTE — Plan of Care (Signed)
?  Problem: Education: ?Goal: Knowledge of General Education information will improve ?Description: Including pain rating scale, medication(s)/side effects and non-pharmacologic comfort measures ?Outcome: Not Progressing ?  ?Problem: Health Behavior/Discharge Planning: ?Goal: Ability to manage health-related needs will improve ?Outcome: Not Progressing ?  ?Problem: Clinical Measurements: ?Goal: Ability to maintain clinical measurements within normal limits will improve ?Outcome: Not Progressing ?Goal: Will remain free from infection ?Outcome: Not Progressing ?Goal: Diagnostic test results will improve ?Outcome: Progressing ?Goal: Respiratory complications will improve ?Outcome: Progressing ?  ?Problem: Activity: ?Goal: Risk for activity intolerance will decrease ?Outcome: Not Progressing ?  ?

## 2022-03-17 ENCOUNTER — Inpatient Hospital Stay (HOSPITAL_COMMUNITY): Payer: Medicare PPO

## 2022-03-17 DIAGNOSIS — R609 Edema, unspecified: Secondary | ICD-10-CM

## 2022-03-17 DIAGNOSIS — J9601 Acute respiratory failure with hypoxia: Secondary | ICD-10-CM | POA: Diagnosis not present

## 2022-03-17 DIAGNOSIS — H5789 Other specified disorders of eye and adnexa: Secondary | ICD-10-CM | POA: Diagnosis not present

## 2022-03-17 LAB — BASIC METABOLIC PANEL
Anion gap: 7 (ref 5–15)
BUN: 85 mg/dL — ABNORMAL HIGH (ref 8–23)
CO2: 23 mmol/L (ref 22–32)
Calcium: 9.3 mg/dL (ref 8.9–10.3)
Chloride: 114 mmol/L — ABNORMAL HIGH (ref 98–111)
Creatinine, Ser: 1.7 mg/dL — ABNORMAL HIGH (ref 0.44–1.00)
GFR, Estimated: 30 mL/min — ABNORMAL LOW (ref >60)
Glucose, Bld: 202 mg/dL — ABNORMAL HIGH (ref 70–99)
Potassium: 4.6 mmol/L (ref 3.5–5.1)
Sodium: 144 mmol/L (ref 135–145)

## 2022-03-17 LAB — BPAM RBC
Blood Product Expiration Date: 202304162359
ISSUE DATE / TIME: 202303260830
Unit Type and Rh: 5100

## 2022-03-17 LAB — TYPE AND SCREEN
ABO/RH(D): O POS
Antibody Screen: NEGATIVE
Unit division: 0

## 2022-03-17 LAB — GLUCOSE, CAPILLARY
Glucose-Capillary: 195 mg/dL — ABNORMAL HIGH (ref 70–99)
Glucose-Capillary: 210 mg/dL — ABNORMAL HIGH (ref 70–99)
Glucose-Capillary: 178 mg/dL — ABNORMAL HIGH (ref 70–99)
Glucose-Capillary: 169 mg/dL — ABNORMAL HIGH (ref 70–99)
Glucose-Capillary: 192 mg/dL — ABNORMAL HIGH (ref 70–99)

## 2022-03-17 LAB — CBC
HCT: 28.2 % — ABNORMAL LOW (ref 36.0–46.0)
Hemoglobin: 8.9 g/dL — ABNORMAL LOW (ref 12.0–15.0)
MCH: 29.6 pg (ref 26.0–34.0)
MCHC: 31.6 g/dL (ref 30.0–36.0)
MCV: 93.7 fL (ref 80.0–100.0)
Platelets: 154 10*3/uL (ref 150–400)
RBC: 3.01 MIL/uL — ABNORMAL LOW (ref 3.87–5.11)
RDW: 16.1 % — ABNORMAL HIGH (ref 11.5–15.5)
WBC: 9.1 10*3/uL (ref 4.0–10.5)
nRBC: 0.4 % — ABNORMAL HIGH (ref 0.0–0.2)

## 2022-03-17 LAB — CALCITRIOL (1,25 DI-OH VIT D): Vit D, 1,25-Dihydroxy: 67.7 pg/mL (ref 24.8–81.5)

## 2022-03-17 LAB — TRIGLYCERIDES: Triglycerides: 133 mg/dL (ref <150)

## 2022-03-17 LAB — MAGNESIUM: Magnesium: 2.3 mg/dL (ref 1.7–2.4)

## 2022-03-17 LAB — PHOSPHORUS: Phosphorus: 3.7 mg/dL (ref 2.5–4.6)

## 2022-03-17 LAB — C1 ESTERASE INHIBITOR: C1INH SerPl-mCnc: 42 mg/dL — ABNORMAL HIGH (ref 21–39)

## 2022-03-17 MED ORDER — HYDRALAZINE HCL 20 MG/ML IJ SOLN
10.0000 mg | INTRAMUSCULAR | Status: AC | PRN
  Administered 2022-03-18 (×2): 10 mg via INTRAVENOUS
  Filled 2022-03-17: qty 1

## 2022-03-17 MED ORDER — FUROSEMIDE 10 MG/ML IJ SOLN
40.0000 mg | Freq: Four times a day (QID) | INTRAMUSCULAR | Status: AC
  Administered 2022-03-17 (×2): 40 mg via INTRAVENOUS
  Filled 2022-03-17 (×2): qty 4

## 2022-03-17 MED ORDER — POLYETHYLENE GLYCOL 3350 17 G PO PACK: 17.0000 g | PACK | Freq: Every day | ORAL | Status: DC | PRN

## 2022-03-17 MED ORDER — DEXAMETHASONE SODIUM PHOSPHATE 4 MG/ML IJ SOLN
4.0000 mg | Freq: Two times a day (BID) | INTRAMUSCULAR | Status: AC
  Administered 2022-03-17 – 2022-03-18 (×3): 4 mg via INTRAVENOUS
  Filled 2022-03-17 (×4): qty 1

## 2022-03-17 NOTE — Progress Notes (Signed)
PT Cancellation Note ? ?Patient Details ?Name: Lauren Henry ?MRN: [id number] ?DOB: [birth year] ? ? ?Cancelled Treatment:    Reason Eval/Treat Not Completed: (P) Other (comment) Pt to be extubated this morning. PT will follow back for treatment this afternoon as able. ? ?Vidya Bamford B. Beverely Risen PT, DPT ?Acute Rehabilitation Services ?Pager [phone] ?Office [phone] ? ? ? ?Elon Alas Fleet ?03/17/2022, 9:20 AM ? ? ?

## 2022-03-17 NOTE — Progress Notes (Signed)
eLink Physician-Brief Progress Note ?Patient Name: Lauren Henry ?DOB: 1941/03/05 ?MRN: [id number] ? ? ?Date of Service ? 03/17/2022  ?HPI/Events of Note ? BP 160/94  ?eICU Interventions ? PRN Hydralazine ordered for BP control.  ? ? ? ?  ? ?Thomasene Lot [name] ?03/17/2022, 11:48 PM ?

## 2022-03-17 NOTE — Evaluation (Addendum)
?SLP Re-assessment of Swallow function at bedside ? ? ? ?Patient Details  ?Name: Lauren Henry ?MRN: [id number] ?Date of Birth: 1941/01/03 ? ?Today's Date: 03/17/2022 ?Time: SLP Start Time (ACUTE ONLY): [id number] SLP Stop Time (ACUTE ONLY): 1515 ?SLP Time Calculation (min) (ACUTE ONLY): 20 min ? ?Past Medical History:  ?Past Medical History:  ?Diagnosis Date  ? Arthritis   ? Atrial fibrillation ([place])   ? Diarrhea   ? since gallbladder removed  ? Dysrhythmia   ? 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 History of blood transfusion as a child  ? no abnormal reaction noted  ? History of colon polyps   ? Hyperlipidemia   ? takes Pravastatin daily  ? Hypertension   ? takes Azor and Metoprolol daily  ? Hypothyroidism   ? takes Synthroid daily  ? Joint pain   ? Presence of permanent cardiac pacemaker   ? Urinary frequency   ? Urinary urgency   ? ?Past Surgical History:  ?Past Surgical History:  ?Procedure Laterality Date  ? ABDOMINAL HYSTERECTOMY    ? cataract surgery Bilateral   ? CHOLECYSTECTOMY  1971  ? COLONOSCOPY    ? EYE SURGERY    ? INSERT / REPLACE / REMOVE PACEMAKER    ? Medtronic PPM 11/01/14 (Dr. Cristopher Peru)  ? JOINT REPLACEMENT Right   ? knee  ? LEFT HEART CATH AND CORONARY ANGIOGRAPHY N/A 11/01/2018  ? Procedure: LEFT HEART CATH AND CORONARY ANGIOGRAPHY;  Surgeon: Nelva Bush, MD;  Location: [place] [facility] CV LAB;  Service: Cardiovascular;  Laterality: N/A;  ? PERMANENT PACEMAKER INSERTION N/A 11/01/2014  ? Procedure: PERMANENT PACEMAKER INSERTION;  Surgeon: Evans Lance, MD;  Location: [facility] CATH LAB;  Service: Cardiovascular;  Laterality: N/A;  ? stomach stapled  1985  ? TOTAL KNEE ARTHROPLASTY Left 11/05/2015  ? Procedure: TOTAL KNEE ARTHROPLASTY;  Surgeon: Vickey Huger, MD;  Location: [facility];  Service: Orthopedics;  Laterality: Left;  ? ?HPI:  ?Patient is an 81 y.o. female with PMH: afib, complete heart block s/p pacemaker (not MRI compatable), nonobstructive CAD, CKD stage III  who presented to the hospital on 3/23 with left eye swelling. Patient had been recently hospitalized from 3/2 to 3/5 after having syncopal episode in setting of UTI where she was taken off blood pressure medications and treated with antibiotics. She was then hospitalized 3/17-3/19 for FTT symptoms with her husband reporting a progressive decline and at least 30lb weight loss since end of December. There was concern for possible underlying malignany but CT chest, abdomen and pelvis did not show any acute cause for her symptoms. Scans noted an 8 mm noncalcified nodule of the right lower lobe for which repeat CT scan recommended in 2 to 3 months.  She was also noted to have new tremor that was not thought to be parkinsonian in nature with mildly elevated free T4, but levothyroxine dose was deferred to PCP. In ED, patient was afebrile and vital signs relatively stable, CXR showed no acute disease. On 3/24 RN called into patient's room by husband due to concerns of trouble breathing. Rapid Response was called and patient started on 2L [place] and transfered to unit 70M. on 3/25 patient with increased WOB and CXR showing interstitial changes, possible aspiration. She was intubated at [place] on 3/25 and extubated to 4L via [place] on 3/27 at 1018.  ?  ?Assessment / Plan / Recommendation  ?Clinical Impression ? Patient was initially evaluated at bedside by SLP  on 3/23 and NPO status recommended to continue at that time. Patient then started having respiratory issues and eventually had to be intubated 3/25-3/27. As patient is now post-extubation, SLP reassessed her swallow function at bedside on 3/27: ? ?Patient presents with clinical s/s of dysphagia as per this bedside/clinical swallow evaluation. She is s/p 3 day intubation and currently has Cortrak feeding tube in place. Patient was evaluted by SLP prior to intubation and at that time, NPO was recommended due to overt s/s aspiration and penetration. During today's evaluation, patient  was initially very lethargic but she opened eyes and become more responsive as session progressed. When her family members arrived (spouse, daughter, grandson) she become more engaged. Voice was low in intensity and hoarse. Oral mucosa is dry and with dried blood on corner of lips, on teeth and gums, tongue, uvula and palate. When patient cued to cough, she demonstrated strong productive cough resulting in moving secretions into oropharynx and SLP able to suction out from oral cavity. Patient consumed 2 small ice chips, two spoon sips water and one very small cup sip water. She exhibited decreased oral preparatory phase/awareness to bolus when being presented leading to discoordination of swallow and suspected delayed swallow initiation. No overt s/s aspiration or penetration observed. SLP is recommending to continue NPO status but allow for small amount of ice chips, spoon sips water PRN after oral care if patient awake and alert. SLP will follow up next date for PO trials. ?SLP Visit Diagnosis: Dysphagia, unspecified (R13.10) ?   ?Aspiration Risk ? Moderate aspiration risk;Risk for inadequate nutrition/hydration  ?  ?Diet Recommendation NPO;Other (Comment) (small amounts water by spoon sip and small ice chips PRN when patient alert)  ? ?Medication Administration: Via alternative means  ?  ?Other  Recommendations Oral Care Recommendations: Oral care QID;Staff/trained caregiver to provide oral care   ? ?Recommendations for follow up therapy are one component of a multi-disciplinary discharge planning process, led by the attending physician.  Recommendations may be updated based on patient status, additional functional criteria and insurance authorization. ? ?Follow up Recommendations Other (comment) (TBD)  ? ? ?  ?Assistance Recommended at Discharge Set up Supervision/Assistance  ?Functional Status Assessment Patient has had a recent decline in their functional status and demonstrates the ability to make significant  improvements in function in a reasonable and predictable amount of time.  ?Frequency and Duration min 2x/week  ?2 weeks ?  ?   ? ?Prognosis Prognosis for Safe Diet Advancement: Good  ? ?  ? ?Swallow Study   ?General Date of Onset: 03/13/22 ?HPI: Patient is an 81 y.o. female with PMH: afib, complete heart block s/p pacemaker (not MRI compatable), nonobstructive CAD, CKD stage III who presented to the hospital on 3/23 with left eye swelling. Patient had been recently hospitalized from 3/2 to 3/5 after having syncopal episode in setting of UTI where she was taken off blood pressure medications and treated with antibiotics. She was then hospitalized 3/17-3/19 for FTT symptoms with her husband reporting a progressive decline and at least 30lb weight loss since end of December. There was concern for possible underlying malignany but CT chest, abdomen and pelvis did not show any acute cause for her symptoms. Scans noted an 8 mm noncalcified nodule of the right lower lobe for which repeat CT scan recommended in 2 to 3 months.  She was also noted to have new tremor that was not thought to be parkinsonian in nature with mildly elevated free T4, but levothyroxine dose  was deferred to PCP. In ED, patient was afebrile and vital signs relatively stable, CXR showed no acute disease. On 3/24 RN called into patient's room by husband due to concerns of trouble breathing. Rapid Response was called and patient started on 2L [place] and transfered to unit 36M. on 3/25 patient with increased WOB and CXR showing interstitial changes, possible aspiration. She was intubated at [place] on 3/25 and extubated to 4L via [place] on 3/27 at 1018. ?Type of Study: Bedside Swallow Evaluation ?Previous Swallow Assessment: BSE 03/13/22 ?Diet Prior to this Study: NPO ?Temperature Spikes Noted: No ?Respiratory Status: Nasal cannula ?History of Recent Intubation: Yes ?Length of Intubations (days): 3 days ?Date extubated: 03/17/22 ?Behavior/Cognition:  Alert;Cooperative;Lethargic/Drowsy ?Oral Cavity Assessment: Dried secretions;Lesions;Other (comment) (dried blood/secretions on teeth, lips, tongue, uvula and palate) ?Oral Care Completed by SLP: Yes ?Oral Cavity - D

## 2022-03-17 NOTE — Progress Notes (Signed)
OT Cancellation Note ? ?Patient Details ?Name: Lauren Henry ?MRN: [id number] ?DOB: 1941-02-05 ? ? ?Cancelled Treatment:    Reason Eval/Treat Not Completed: Patient not medically ready Plans for extubation this AM. Will follow-up in PM as schedule permits for OT eval.  ? ?[name] ?03/17/2022, 9:31 AM ?

## 2022-03-17 NOTE — Progress Notes (Signed)
Physical Therapy Treatment ?Patient Details ?Name: Lauren Henry ?MRN: [id number] ?DOB: 07-09-1941 ?Today's Date: 03/17/2022 ? ? ?History of Present Illness 81 y.o female  presents to the ED via ambulance 3/23 with a chief complaint of right arm swelling and left eye swollen x a few days.   Allergic reaction?  PMH:   complete heart block, HTN, Afib on eliquis ? ?  ?PT Comments  ? ? Pt supine in bed, husband at her side. Pt able to answer limited questions due to decreased phonation and fatigue. Pt able to follow commands with increased time and effort. Able to perform limited AROM  prior to mobility. Pt requiring total A for coming to the EoB where she was able to sit for approximately 8 min with ~20 sec of static sitting without support. Pt with swinging of LE in seated limited by increased weakness. Pt fatigues quickly requiring total A for return to bed. D/c plans remain appropriate pending progress of mobility. PT will continue to follow acutely. ?  ?Recommendations for follow up therapy are one component of a multi-disciplinary discharge planning process, led by the attending physician.  Recommendations may be updated based on patient status, additional functional criteria and insurance authorization. ? ?Follow Up Recommendations ? Acute inpatient rehab (3hours/day) ?  ?  ?Assistance Recommended at Discharge Frequent or constant Supervision/Assistance  ?Patient can return home with the following Assistance with cooking/housework;Assist for transportation;Help with stairs or ramp for entrance;A lot of help with walking and/or transfers;A lot of help with bathing/dressing/bathroom ?  ?Equipment Recommendations ? None recommended by PT  ?  ?Recommendations for Other Services Rehab consult ? ? ?  ?Precautions / Restrictions Precautions ?Precautions: Fall ?Precaution Comments: Parkinson's features per chart ?Restrictions ?Weight Bearing Restrictions: No  ?  ? ?Mobility ? Bed Mobility ?Overal bed mobility: Needs  Assistance ?Bed Mobility: Supine to Sit ?  ?  ?Supine to sit: HOB elevated, Total assist, Max assist ?Sit to supine: Total assist ?  ?General bed mobility comments: min A pt able to move LE to EoB, however requires total A for bringing hips to EoB and trunk to upright, pt fatigues quickly and requires total A for return to bed ?  ? ?Transfers ?  ?  ?  ?  ?  ?  ?  ?  ?  ?General transfer comment: unable due to increased fatigue at EoB ?  ? ?  ? ? ?  ?Balance Overall balance assessment: Needs assistance ?Sitting-balance support: Bilateral upper extremity supported, Feet unsupported ?Sitting balance-Leahy Scale: Poor ?Sitting balance - Comments: able to static sit for approximately 20 sec before requiring support ?  ?  ?  ?  ?  ?  ?  ?  ?  ?  ?  ?  ?  ?  ?  ?  ? ?  ?Cognition Arousal/Alertness: Awake/alert ?Behavior During Therapy: [facility] for tasks assessed/performed ?Overall Cognitive Status: Within Functional Limits for tasks assessed ?Area of Impairment: Safety/judgement ?  ?  ?  ?  ?  ?  ?  ?  ?  ?  ?  ?  ?Safety/Judgement: Decreased awareness of deficits ?  ?  ?General Comments: pt able to recall that she was married 57 years, decreased phonation and response to questions, ?  ?  ? ?  ?Exercises General Exercises - Lower Extremity ?Ankle Circles/Pumps: AROM, Both, 10 reps, Supine ? ?  ?General Comments General comments (skin integrity, edema, etc.): Pt on 5L O2 via [place] SpO2 100% O2, HR  80-100s, BP 169/88 ?  ?  ? ?Pertinent Vitals/Pain Pain Assessment ?Pain Assessment: Faces ?Faces Pain Scale: Hurts little more ?Pain Location: generalized ?Pain Descriptors / Indicators: Discomfort, Grimacing ?Pain Intervention(s): Limited activity within patient's tolerance, Monitored during session, Repositioned  ? ? ? ?PT Goals (current goals can now be found in the care plan section) Acute Rehab PT Goals ?Patient Stated Goal: home with husband ?PT Goal Formulation: With patient/family ?Time For Goal Achievement: 03/28/22 ?Potential  to Achieve Goals: Good ?Progress towards PT goals: Not progressing toward goals - comment ? ?  ?Frequency ? ? ? Min 3X/week ? ? ? ?  ?PT Plan Current plan remains appropriate  ? ? ?   ?AM-PAC PT "6 Clicks" Mobility   ?Outcome Measure ? Help needed turning from your back to your side while in a flat bed without using bedrails?: Total ?Help needed moving from lying on your back to sitting on the side of a flat bed without using bedrails?: Total ?Help needed moving to and from a bed to a chair (including a wheelchair)?: Total ?Help needed standing up from a chair using your arms (e.g., wheelchair or bedside chair)?: Total ?Help needed to walk in hospital room?: Total ?Help needed climbing 3-5 steps with a railing? : Total ?6 Click Score: 6 ? ?  ?End of Session Equipment Utilized During Treatment: Oxygen ?Activity Tolerance: Patient limited by fatigue ?Patient left: with call bell/phone within reach;with family/visitor present;in bed;with bed alarm set ?Nurse Communication: Mobility status ?PT Visit Diagnosis: Muscle weakness (generalized) (M62.81);Other abnormalities of gait and mobility (R26.89) ?  ? ? ?Time: [id number] ?PT Time Calculation (min) (ACUTE ONLY): 25 min ? ?Charges:  $Therapeutic Activity: 23-37 mins          ?          ? ?Lenah Messenger B. Migdalia Dk PT, DPT ?Acute Rehabilitation Services ?Pager [phone] ?Office [phone] ? ? ? ?Dante ?03/17/2022, 4:36 PM ? ?

## 2022-03-17 NOTE — Procedures (Signed)
Extubation Procedure Note ? ?Patient Details:   ?Name: Lauren Henry ?DOB: 06/09/1941 ?MRN: [id number] ?  ?Airway Documentation:  ?  ?Vent end date: 03/17/22 Vent end time: 1018  ? ?Evaluation ? O2 sats: stable throughout ?Complications: No apparent complications ?Patient did tolerate procedure well. ?Bilateral Breath Sounds: Clear, Diminished ?  ?Patient extubated & placed on 4L [place]. Patient did have a good cuff leak prior to extubation. Patient is able to speak & cough post extubation. Patient is currently in no distress. ? ?Jacqulynn Cadet ?03/17/2022, 10:27 AM ? ?

## 2022-03-17 NOTE — Progress Notes (Signed)
? ?NAME:  GRISSEL PARSHALL, MRN:  [id number], DOB:  02/02/1941, LOS: 3 ?ADMISSION DATE:  03/13/2022, CONSULTATION DATE:  03/14/22 ?REFERRING MD:  Elgergawy CHIEF COMPLAINT:  Facial swelling, angioedema.  ? ?History of Present Illness:  ?POLIXENI BROWE is a 81 y.o. female who has a PMH as outlined below.  She presented to [facility] ED 3/23 with left eye swelling that began the day prior.  Husband also noticed a rash to her forehead and face that began roughly around the same time.  Husband states that rash was erythematous and raised (see photos below).  Per chart review, she had recent hospitalization 3/2 through 3/5 for syncope secondary to UTI.  She was taken off blood pressure medications and treated with antibiotics.  She was then again hospitalized 3/17 through 3/19 for failure to thrive.  Husband states ever since then, she has had a progressive decline and in fact she has had 30 pound weight loss dating back to Christmas 2022.  CT scan of the chest/abdomen/pelvis was obtained to rule out an underlying malignancy and this only revealed a 8 mm noncalcified nodule in the right lower lobe for which repeat CT was recommended in 2 to 3 months. ? ?In regards to her current presentation, husband reported that over the last week, speech had become slightly slurred which coincided with neck swelling as well as dysphagia with coughing while eating.  She apparently was given B12 and B1 per husband 3/22 and rash started shortly thereafter. ? ?3/23 swelling worsened and she had slight difficulty in breathing; therefore, EMS was called. ? ?Morning of 3/24, facial swelling worsened and she started to have tongue swelling along with tachypnea and difficulty breathing.  PCCM was subsequently called in consultation due to concern for worsening angioedema. ? ?At the time of our evaluation, she has edematous tongue; however, her posterior pharynx is open.  She is able to speak in 3-4 word sentences.  She has been transferred to the ICU for  close monitoring and observation.  She received 10 mg Decadron, 20 mg Pepcid, 50 mg Benadryl as well as IM epinephrine. ? 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; Knee pain; Right-sided low back pain with right-sided sciatica; Elevated troponin with new LBBB; Chronic anticoagulation; CAD in native artery; Chronic renal disease, stage III ([place]); Anemia; Nausea vomiting and diarrhea; AKI (acute kidney injury) ([place]); Gastroenteropathy; Weakness; Orthostatic hypotension; Glaucoma; Anxiety disorder; Postural dizziness with presyncope; Syncope and collapse; UTI (urinary tract infection); Syncope; Stage 3b chronic kidney disease (CKD) ([place]); Failure to thrive in adult; Parkinsonian features; Microscopic hematuria; Protein-calorie malnutrition, severe; Acute kidney injury superimposed on chronic kidney disease ([place]); Neck and right arm swelling; Rash; Dysphagia; Swelling of right upper extremity; Eye swelling, left; Chronic diastolic CHF (congestive heart failure) ([place]); and Angio-edema on their problem list. ? ? ?Significant Hospital Events: ?Including procedures, antibiotic start and stop dates in addition to other pertinent events   ?3/23 admit. ?3/24 ?angioedema, PCCM consult, transfer to ICU. ?3/25 intubated, unasyn, skin biopsy ? ?Interim History / Subjective:  ?No events. ?Rash seems stable and improving ?HD stable. ?RASS -1. ? ?Objective:  ?Blood pressure 139/79, pulse 83, temperature 97.6 ?F (  36.4 ?C), temperature source Axillary, resp. rate 13, height 5\' 2"  (1.575 m), weight  76.4 kg, SpO2 97 %. ?   ?Vent Mode: PRVC ?FiO2 (%):  [40 %] 40 % ?Set Rate:  [26 bmp] 26 bmp ?Vt Set:  [380 mL-400 mL] 400 mL ?PEEP:  [5 cmH20] 5 cmH20 ?Plateau Pressure:  [14 cmH20-18 cmH20] 15 cmH20  ? ?Intake/Output Summary (Last 24 hours) at 03/17/2022 1023 ?Last data filed at 03/17/2022 0901 ?Gross per 24 hour  ?Intake 3131.67 ml  ?Output 1200 ml  ?Net 1931.67 ml  ? ?Filed Weights  ? 03/16/22 0017 03/16/22 0114 03/17/22 0120  ?Weight: 76.2 kg 76.2 kg 76.4 kg  ? ? ?Examination: ?Intubated and sedated but will open eyes to voice, follow commands with enough prompting ?Periorbital swelling improving ?Firm tender submandibular swelling bilateral, stable, improving ?Ext warm ?Raised papular rash on trunk scabbing over, does not appear to be spreading ?Purpura on legs and back stable, improving ?Yellow urine ?Lungs clear ? ?H/H improved post transfusion ?Cr stable, BUN rising probably from steroids ? ? ?Labs/imaging personally reviewed:  ?CT soft tissue neck 3/23 > enlarged tonsils bilaterally, moderate left periorbital edema without extension into the orbit. ? ?Assessment & Plan:  ?Appears to be two separate rashes- original papular rash and facial swelling.  Then developed purpura in LE and back.  Likely due to reaction to clindamycin use; IgA levels WNL; Group a strep PCR negative ? ?Acute hypercarbic respiratory failure due to systemic allergic reaction, oropharyngeal swelling, now on vent  ? ?Hx hypothyroid, PAF, dCHF, CKD- TSH ok, volume status looks better ? ?Acute on chronic renal injury improved with fluids ? ?Hyperreflexia, weakness, dysphagia- neuro following, ?CT myelogram at some point, has MRI non-compatible PPM; would prefer to just re-evaluate once off vent ? ?New anemia- no s/s of bleeding, did get fair bit of fluids wonder if at least some dilutional + phlebotomy; hemoglobin improved with 1 unit of PRBC;  ? ?- Continue thiamine/folate ?- Continue steroids, pepcid, benadryl ?- f/u skin path from biopsy  3/25 ?- f/u remaining rheum labs (nonspecific to date); so far IgA WNL; autoimmune workup largely negative ?- follow up strep culture pending ?- Continue synthroid ?- Vent support, VAP prevention bundle, empiric unasyn.  SAT/SBT today ?- Further CNS imaging per neuro; eliquis on hold in case needs myelogram ?- 1 unit blood, monitor for s/s of bleeding; elevated BUN could be in setting of steroid use but has good risk of UGIB, keep an eye on Bms, continue ppx lovenox for now; if hgb continue to trend down, consider GI consult for further evaluation ? ?Best practice (evaluated daily):  ?Diet/type: TF ?DVT prophylaxis: lovenox ?GI prophylaxis: H2B ?Lines: N/A ?Foley:  N/A ?Code Status:  full code ?Last date of multidisciplinary goals of care discussion: will update today ? ?33 min cc time independent of procedures ? ?Erskine Emery MD PCCM ? ? ?

## 2022-03-17 NOTE — Progress Notes (Signed)
Right upper extremity venous duplex completed. ?Refer to "CV Proc" under chart review to view preliminary results. ? ?Preliminary results discussed with Dr. Katrinka Blazing. ? ?03/17/2022 11:49 AM ?Eula Fried., MHA, RVT, RDCS, RDMS   ?

## 2022-03-18 ENCOUNTER — Inpatient Hospital Stay (HOSPITAL_COMMUNITY): Payer: Medicare PPO

## 2022-03-18 DIAGNOSIS — T783XXA Angioneurotic edema, initial encounter: Secondary | ICD-10-CM | POA: Diagnosis not present

## 2022-03-18 DIAGNOSIS — N179 Acute kidney failure, unspecified: Secondary | ICD-10-CM | POA: Diagnosis not present

## 2022-03-18 LAB — BASIC METABOLIC PANEL
Anion gap: 12 (ref 5–15)
BUN: 88 mg/dL — ABNORMAL HIGH (ref 8–23)
CO2: 24 mmol/L (ref 22–32)
Calcium: 9.2 mg/dL (ref 8.9–10.3)
Chloride: 107 mmol/L (ref 98–111)
Creatinine, Ser: 1.64 mg/dL — ABNORMAL HIGH (ref 0.44–1.00)
GFR, Estimated: 31 mL/min — ABNORMAL LOW (ref >60)
Glucose, Bld: 200 mg/dL — ABNORMAL HIGH (ref 70–99)
Potassium: 4.6 mmol/L (ref 3.5–5.1)
Sodium: 143 mmol/L (ref 135–145)

## 2022-03-18 LAB — CBC
HCT: 32.3 % — ABNORMAL LOW (ref 36.0–46.0)
Hemoglobin: 10.5 g/dL — ABNORMAL LOW (ref 12.0–15.0)
MCH: 29.4 pg (ref 26.0–34.0)
MCHC: 32.5 g/dL (ref 30.0–36.0)
MCV: 90.5 fL (ref 80.0–100.0)
Platelets: 195 10*3/uL (ref 150–400)
RBC: 3.57 MIL/uL — ABNORMAL LOW (ref 3.87–5.11)
RDW: 15.9 % — ABNORMAL HIGH (ref 11.5–15.5)
WBC: 17 10*3/uL — ABNORMAL HIGH (ref 4.0–10.5)
nRBC: 0.7 % — ABNORMAL HIGH (ref 0.0–0.2)

## 2022-03-18 LAB — MAGNESIUM: Magnesium: 1.9 mg/dL (ref 1.7–2.4)

## 2022-03-18 LAB — GLUCOSE, CAPILLARY
Glucose-Capillary: 179 mg/dL — ABNORMAL HIGH (ref 70–99)
Glucose-Capillary: 186 mg/dL — ABNORMAL HIGH (ref 70–99)
Glucose-Capillary: 164 mg/dL — ABNORMAL HIGH (ref 70–99)
Glucose-Capillary: 124 mg/dL — ABNORMAL HIGH (ref 70–99)
Glucose-Capillary: 147 mg/dL — ABNORMAL HIGH (ref 70–99)
Glucose-Capillary: 188 mg/dL — ABNORMAL HIGH (ref 70–99)

## 2022-03-18 LAB — TRIGLYCERIDES: Triglycerides: 158 mg/dL — ABNORMAL HIGH (ref <150)

## 2022-03-18 LAB — CRYOGLOBULIN

## 2022-03-18 LAB — PHOSPHORUS: Phosphorus: 2.5 mg/dL (ref 2.5–4.6)

## 2022-03-18 MED ORDER — DIPHENHYDRAMINE HCL 12.5 MG/5ML PO ELIX: 25.0000 mg | ORAL_SOLUTION | Freq: Two times a day (BID) | ORAL | Status: DC

## 2022-03-18 MED ORDER — DEXAMETHASONE 2 MG PO TABS
2.0000 mg | ORAL_TABLET | Freq: Every day | ORAL | Status: AC
  Administered 2022-03-21: 2 mg
  Filled 2022-03-18: qty 1

## 2022-03-18 MED ORDER — APIXABAN 2.5 MG PO TABS: 2.5000 mg | ORAL_TABLET | Freq: Two times a day (BID) | ORAL | Status: DC

## 2022-03-18 MED ORDER — DEXAMETHASONE 2 MG PO TABS
2.0000 mg | ORAL_TABLET | Freq: Two times a day (BID) | ORAL | Status: AC
  Administered 2022-03-20 (×2): 2 mg
  Filled 2022-03-18 (×2): qty 1

## 2022-03-18 MED ORDER — DEXAMETHASONE 4 MG PO TABS
4.0000 mg | ORAL_TABLET | Freq: Two times a day (BID) | ORAL | Status: AC
  Administered 2022-03-19 (×2): 4 mg
  Filled 2022-03-18 (×2): qty 1

## 2022-03-18 MED ORDER — SODIUM CHLORIDE 0.9 % IV SOLN
2.0000 g | INTRAVENOUS | Status: DC
  Administered 2022-03-19 – 2022-03-21 (×3): 2 g via INTRAVENOUS
  Filled 2022-03-18 (×3): qty 20

## 2022-03-18 NOTE — Progress Notes (Signed)
Speech Language Pathology Treatment: Dysphagia  ?Patient Details ?Name: Lauren Henry ?MRN: [id number] ?DOB: 1941/02/19 ?Today's Date: 03/18/2022 ?Time: [id number] ?SLP Time Calculation (min) (ACUTE ONLY): 20 min ? ?Assessment / Plan / Recommendation ?Clinical Impression ? Patient seen by SLP for skilled treatment focused on dysphagia goals. Patient was awake, alert but drowsy/lethargic and kept eyes mostly closed. She had a friend visiting. Patient was sitting up in recliner and had recently had OT evaluation. SLP performed oral care however no significant amount of secretions removed and patient continues with dried blood on lips and tip of tongue with likely sores. Patient continues with primarily whisper voicing and continues to be very weak and easily fatigued. During initial evaluation on 3/23 (prior to intubation), SLP recommended possible benefit from objective swallow study (MBS). During today's session, SLP observed patient with PO intake of 6-7 small ice chips. She demonstrated prolonged oral transit, minimal mastication strength and suspect swallow initiation delay as well as suspected decreased pharyngeal contraction. Patient with both immediate and delayed dry, congested sounding cough without expectoration. SLP plans to attempt to return this date later in PM to see if patient more alert, less fatigued. Goal is for readiness to have MBS. ? ?  ?HPI HPI: Patient is an 81 y.o. female with PMH: afib, complete heart block s/p pacemaker (not MRI compatable), nonobstructive CAD, CKD stage III who presented to the hospital on 3/23 with left eye swelling. Patient had been recently hospitalized from 3/2 to 3/5 after having syncopal episode in setting of UTI where she was taken off blood pressure medications and treated with antibiotics. She was then hospitalized 3/17-3/19 for FTT symptoms with her husband reporting a progressive decline and at least 30lb weight loss since end of December. There was concern for  possible underlying malignany but CT chest, abdomen and pelvis did not show any acute cause for her symptoms. Scans noted an 8 mm noncalcified nodule of the right lower lobe for which repeat CT scan recommended in 2 to 3 months.  She was also noted to have new tremor that was not thought to be parkinsonian in nature with mildly elevated free T4, but levothyroxine dose was deferred to PCP. In ED, patient was afebrile and vital signs relatively stable, CXR showed no acute disease. On 3/24 RN called into patient's room by husband due to concerns of trouble breathing. Rapid Response was called and patient started on 2L [place] and transfered to unit 52M. on 3/25 patient with increased WOB and CXR showing interstitial changes, possible aspiration. She was intubated at [place] on 3/25 and extubated to 4L via [place] on 3/27 at 1018. ?  ?   ?SLP Plan ?   ? ?  ?  ?Recommendations for follow up therapy are one component of a multi-disciplinary discharge planning process, led by the attending physician.  Recommendations may be updated based on patient status, additional functional criteria and insurance authorization. ?  ? ?Recommendations  ?Diet recommendations: NPO ?Medication Administration: Via alternative means  ?   ?    ?   ? ? ? ? Oral Care Recommendations: Oral care QID;Staff/trained caregiver to provide oral care ?Follow Up Recommendations: Acute inpatient rehab (3hours/day) ?Assistance recommended at discharge: Frequent or constant Supervision/Assistance ?SLP Visit Diagnosis: Dysphagia, unspecified (R13.10) ? ? ? ? ?  ?  ? ?Sonia Baller, MA, CCC-SLP ?Speech Therapy ? ?

## 2022-03-18 NOTE — Progress Notes (Signed)
? ?NAME:  Lauren Henry, MRN:  [id number], DOB:  Jun 07, 1941, LOS: 4 ?ADMISSION DATE:  03/13/2022, CONSULTATION DATE:  03/14/22 ?REFERRING MD:  Elgergawy CHIEF COMPLAINT:  Facial swelling, angioedema.  ? ?History of Present Illness:  ?Lauren Henry is a 81 y.o. female who has a PMH as outlined below.  She presented to [facility] ED 3/23 with left eye swelling that began the day prior.  Husband also noticed a rash to her forehead and face that began roughly around the same time.  Husband states that rash was erythematous and raised (see photos below).  Per chart review, she had recent hospitalization 3/2 through 3/5 for syncope secondary to UTI.  She was taken off blood pressure medications and treated with antibiotics.  She was then again hospitalized 3/17 through 3/19 for failure to thrive.  Husband states ever since then, she has had a progressive decline and in fact she has had 30 pound weight loss dating back to Christmas 2022.  CT scan of the chest/abdomen/pelvis was obtained to rule out an underlying malignancy and this only revealed a 8 mm noncalcified nodule in the right lower lobe for which repeat CT was recommended in 2 to 3 months. ? ?In regards to her current presentation, husband reported that over the last week, speech had become slightly slurred which coincided with neck swelling as well as dysphagia with coughing while eating.  She apparently was given B12 and B1 per husband 3/22 and rash started shortly thereafter. ? ?3/23 swelling worsened and she had slight difficulty in breathing; therefore, EMS was called. ? ?Morning of 3/24, facial swelling worsened and she started to have tongue swelling along with tachypnea and difficulty breathing.  PCCM was subsequently called in consultation due to concern for worsening angioedema. ? ?At the time of our evaluation, she has edematous tongue; however, her posterior pharynx is open.  She is able to speak in 3-4 word sentences.  She has been transferred to the ICU for  close monitoring and observation.  She received 10 mg Decadron, 20 mg Pepcid, 50 mg Benadryl as well as IM epinephrine. ? 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; Knee pain; Right-sided low back pain with right-sided sciatica; Elevated troponin with new LBBB; Chronic anticoagulation; CAD in native artery; Chronic renal disease, stage III ([place]); Anemia; Nausea vomiting and diarrhea; AKI (acute kidney injury) ([place]); Gastroenteropathy; Weakness; Orthostatic hypotension; Glaucoma; Anxiety disorder; Postural dizziness with presyncope; Syncope and collapse; UTI (urinary tract infection); Syncope; Stage 3b chronic kidney disease (CKD) ([place]); Failure to thrive in adult; Parkinsonian features; Microscopic hematuria; Protein-calorie malnutrition, severe; Acute kidney injury superimposed on chronic kidney disease ([place]); Neck and right arm swelling; Rash; Dysphagia; Swelling of right upper extremity; Eye swelling, left; Chronic diastolic CHF (congestive heart failure) ([place]); and Angio-edema on their problem list. ? ? ?Significant Hospital Events: ?Including procedures, antibiotic start and stop dates in addition to other pertinent events   ?3/23 admit. ?3/24 ?angioedema, PCCM consult, transfer to ICU. ?3/25 intubated, unasyn, skin biopsy ?3/27 extubated ? ?Interim History / Subjective:  ?No events. Rash seems stable and improving. Alert and answering questions appropriately. ? ? ?Objective:  ?Blood pressure 124/82, pulse  98, temperature 98.2 ?F (36.8 ?C), temperature source Axillary, resp.  rate 14, height 5\' 2"  (1.575 m), weight 76.1 kg, SpO2 96 %. ?   ?FiO2 (%):  [40 %] 40 %  ? ?Intake/Output Summary (Last 24 hours) at 03/18/2022 1053 ?Last data filed at 03/18/2022 1037 ?Gross per 24 hour  ?Intake 1298.13 ml  ?Output 4900 ml  ?Net -3601.87 ml  ? ?Filed Weights  ? 03/16/22 0114 03/17/22 0120 03/18/22 0352  ?Weight: 76.2 kg 76.4 kg 76.1 kg  ? ? ?Examination: ?Alert and answering questions appropriately. ?Periorbital swelling improving ?Firm tender submandibular swelling bilateral, stable, improving ?Ext warm ?Raised papular rash on trunk scabbing over, does not appear to be spreading ?Purpura on legs and back stable, improving ?Yellow urine ?Lungs clear ? ?H/H improved post transfusion ?Cr stable, BUN rising probably from steroids ? ? ?Labs/imaging personally reviewed:  ?CT soft tissue neck 3/23 > enlarged tonsils bilaterally, moderate left periorbital edema without extension into the orbit. ? ?Assessment & Plan:  ? ?Systemic reaction likely due to reaction to clindamycin use; IgA levels WNL; Group a strep PCR negative. Doing well, neck swelling seems improved.  Rashes improving. ? ?Extubated yesterday; tolerated well. Failed SLP, Patient still high risk for aspiration. Tube feeds for now. ? ?Hx hypothyroid, PAF, dCHF, CKD- TSH ok, volume status looks better ? ?Acute on chronic renal injury improved with fluids ? ?Hyperreflexia, weakness, dysphagia- neuro following, ?CT myelogram at some point, has MRI non-compatible PPM; would prefer to just re-evaluate once off vent ? ?New anemia- no s/s of bleeding, did get fair bit of fluids wonder if at least some dilutional + phlebotomy; hemoglobin improved with 1 unit of PRBC; Hgb continues to improve. ? ?- Continue thiamine/folate ?- Switch steroid to oral and taper; hold benadryl ?- f/u skin path from biopsy 3/25 ?- f/u remaining rheum labs (nonspecific to date); so far IgA WNL; autoimmune workup largely negative ?- continue antibiotics; cefepime x7 days ?- Resp  clx positive for klebsiella  ?- Continue synthroid ?-SLP again today; if pass, can advance diet as tolerated ?-Patient stable for transfer this afternoon ? ?Best practice (evaluated daily):  ?Diet/type: TF ?DVT prophylaxis: lovenox ?GI prophylaxis: H2B ?Lines: N/A ?Foley:  N/A ?Code Status:  full code ?Last date of multidisciplinary goals of care discussion: Husband updated at bedside ? ?Timothy Lasso, MD ?PGY1 Internal Medicine Resident ?Pager: [phone] ? ? ? ?

## 2022-03-18 NOTE — Progress Notes (Signed)
Report called and given to [facility] prior to transfer to [phone]. Transfer successful/ uneventful. ? ?

## 2022-03-18 NOTE — Progress Notes (Signed)
SLP Cancellation Note ? ?Patient Details ?Name: Lauren Henry ?MRN: [id number] ?DOB: 1941/08/22 ? ? ?Cancelled treatment:       Reason Eval/Treat Not Completed: Patient's level of consciousness. SLP returned to check on patient in later PM but she was in bed and sleeping. SLP spoke briefly with patient's daughter regarding plan for likely MBS when patient alert enough. Daughter in agreement and understanding. SLP will f/u next date. ? ?Sonia Baller, MA, CCC-SLP ?Speech Therapy ? ?

## 2022-03-18 NOTE — Evaluation (Signed)
Occupational Therapy Evaluation ?Patient Details ?Name: Lauren Henry ?MRN: [id number] ?DOB: 1941/09/18 ?Today's Date: 03/18/2022 ? ? ?History of Present Illness 81 y.o female  presents to the ED via ambulance 3/23 with a chief complaint of right arm swelling and left eye swollen x a few days.   Allergic reaction?  PMH:   complete heart block, HTN, Afib on eliquis  ? ?Clinical Impression ?  ?PTA, pt lives with spouse and prior to Dec 2022, pt Independent in all tasks. However, since Christmas, pt with steady functional decline, able to ambulate short distances with RW and complete ADLs with light assist at most. Pt presents now with significant deficits in strength, endurance, cognition and balance. Pt kept eyes closed for much of session but demonstrated consistent command following. Pt currently requires Max A x 2 for bed mobility and transfers within [place]. Pt requires Max A for UB ADLs and up to Total A x 2 for LB ADLs, significantly below functional baseline. Pt's friend Hassan Rowan) present attests to pt being a hard worker and hopeful for functional recovery. Based on complexity of deficits and noted decline from baseline, recommend postacute rehab at [facility] (AIR vs SNF pending activity tolerance improvements).  ? ?HR 104 at rest, 135bpm with transfers ?Tachypnea with activity, SpO2 98% on RA ?   ? ?Recommendations for follow up therapy are one component of a multi-disciplinary discharge planning process, led by the attending physician.  Recommendations may be updated based on patient status, additional functional criteria and insurance authorization.  ? ?Follow Up Recommendations ? Acute inpatient rehab (3hours/day) (SNF if activity tolerance does not improve)  ?  ?Assistance Recommended at Discharge Frequent or constant Supervision/Assistance  ?Patient can return home with the following Two people to help with walking and/or transfers;Two people to help with bathing/dressing/bathroom ? ?  ?Functional Status  Assessment ? Patient has had a recent decline in their functional status and demonstrates the ability to make significant improvements in function in a reasonable and predictable amount of time.  ?Equipment Recommendations ? Hospital bed  ?  ?Recommendations for Other Services Rehab consult ? ? ?  ?Precautions / Restrictions Precautions ?Precautions: Fall;Other (comment) ?Precaution Comments: cortrak, watch HR ?Restrictions ?Weight Bearing Restrictions: No  ? ?  ? ?Mobility Bed Mobility ?Overal bed mobility: Needs Assistance ?Bed Mobility: Supine to Sit ?  ?  ?Supine to sit: Max assist, +2 for physical assistance, +2 for safety/equipment, HOB elevated ?  ?  ?General bed mobility comments: Pt did demo ability to follow commands to assist in B LE to EOB - significant assist needed to lift trunk with poor head/neck control ?  ? ?Transfers ?Overall transfer level: Needs assistance ?Equipment used: Ambulation equipment used ?Transfers: Sit to/from Stand, Bed to chair/wheelchair/BSC ?Sit to Stand: Max assist, +2 physical assistance, +2 safety/equipment, From elevated surface ?  ?  ?  ?  ?  ?General transfer comment: Max A x 2 for sit to stand from elevated bed into Stedy; successful on second attempt with use of bedpad to lift bottom - able to demo muscle activation to assist but unable to lift trunk fully upright. Transferred to chair with Stedy ?Transfer via Lift Equipment: Stedy ? ?  ?Balance Overall balance assessment: Needs assistance ?Sitting-balance support: Bilateral upper extremity supported, Feet unsupported ?Sitting balance-Leahy Scale: Poor ?Sitting balance - Comments: able to statically sit with up to Min A for L lateral lean correction. pt able to demo correction of posterior lean with cues to lean forward ?Postural control:  Left lateral lean, Posterior lean ?Standing balance support: Reliant on assistive device for balance, Bilateral upper extremity supported, During functional activity ?Standing  balance-Leahy Scale: Poor ?  ?  ?  ?  ?  ?  ?  ?  ?  ?  ?  ?  ?   ? ?ADL either performed or assessed with clinical judgement  ? ?ADL Overall ADL's : Needs assistance/impaired ?Eating/Feeding: NPO ?  ?Grooming: Maximal assistance;Sitting ?  ?Upper Body Bathing: Total assistance;Sitting ?  ?Lower Body Bathing: Total assistance;+2 for physical assistance;+2 for safety/equipment ?  ?Upper Body Dressing : Total assistance ?  ?Lower Body Dressing: Total assistance;+2 for physical assistance;+2 for safety/equipment ?  ?  ?  ?Toileting- Clothing Manipulation and Hygiene: +2 for safety/equipment;Total assistance;+2 for physical assistance;Bed level ?  ?  ?  ?  ?General ADL Comments: Pt with significant decline requiring +2 assist for LB ADLs, bed mobility and OOB transfers  ? ? ? ?Vision Baseline Vision/History: 1 Wears glasses ?Ability to See in Adequate Light: 1 Impaired ?Patient Visual Report: No change from baseline ?Vision Assessment?: Vision impaired- to be further tested in functional context ?Additional Comments: difficult to determine as pt kept eyes closed majority of time. eyes reddened and watery  ?   ?Perception   ?  ?Praxis   ?  ? ?Pertinent Vitals/Pain Pain Assessment ?Pain Assessment: Faces ?Faces Pain Scale: No hurt ?Pain Intervention(s): Monitored during session  ? ? ? ?Hand Dominance Right ?  ?Extremity/Trunk Assessment Upper Extremity Assessment ?Upper Extremity Assessment: Difficult to assess due to impaired cognition;Generalized weakness (grip strength 3+/5) ?  ?Lower Extremity Assessment ?Lower Extremity Assessment: Defer to PT evaluation ?  ?Cervical / Trunk Assessment ?Cervical / Trunk Assessment: Kyphotic ?  ?Communication Communication ?Communication: Expressive difficulties ?  ?Cognition Arousal/Alertness: Awake/alert ?Behavior During Therapy: Flat affect ?Overall Cognitive Status: Difficult to assess ?Area of Impairment: Attention, Following commands, Safety/judgement, Awareness, Problem  solving ?  ?  ?  ?  ?  ?  ?  ?  ?  ?Current Attention Level: Sustained ?  ?Following Commands: Follows one step commands with increased time, Follows one step commands consistently ?Safety/Judgement: Decreased awareness of deficits ?Awareness: Intellectual ?Problem Solving: Slow processing, Decreased initiation, Difficulty sequencing, Requires verbal cues, Requires tactile cues ?General Comments: minimally verbal; flat affect and kept eyes closed for session but did demonstrate ability to follow one step commands throughout (correct balance, reach UE up, squeeze hands, etc). ?  ?  ?General Comments  on RA on entry (noted 4 L O2 on wall but RN says this has been removed). HR 104 at rest, up to 135bpm with transfer and tachypnea noted - normalized quickly with seated rest break. ? ?  ?Exercises   ?  ?Shoulder Instructions    ? ? ?Home Living Family/patient expects to be discharged to:: Private residence ?Living Arrangements: Spouse/significant other ?Available Help at Discharge: Family;Available 24 hours/day ?Type of Home: House ?Home Access: Ramped entrance ?  ?Entrance Stairs-Rails: Can reach both ?Home Layout: One level ?  ?  ?Bathroom Shower/Tub: Walk-in shower ?  ?Bathroom Toilet: Handicapped height ?Bathroom Accessibility: Yes ?How Accessible: Accessible via walker ?Home Equipment: Conservation officer, nature (2 wheels);Cane - single point;Grab bars - toilet;Grab bars - tub/shower;Hand held shower head;Shower seat;BSC/3in1;Toilet riser;Wheelchair - manual ?  ?Additional Comments: Home setup obtained from PT eval as pt unable to provide ?  ? ?  ?Prior Functioning/Environment Prior Level of Function : Needs assist ?  ?  ?  ?Physical Assist :  Mobility (physical);ADLs (physical) ?  ?  ?Mobility Comments: able to ambulate with RW short distances in the home ?ADLs Comments: has required increased assist for bathing/dressing in recent months. Prior to Christmas, pt's friend reports pt was out in yard working, mowing yard, etc ?   ? ?  ?  ?OT Problem List: Decreased strength;Decreased activity tolerance;Impaired balance (sitting and/or standing);Decreased safety awareness;Decreased cognition;Decreased coordination;Impaired vision/perception;Decreased knowl

## 2022-03-18 NOTE — Progress Notes (Signed)
Inpatient Rehab Admissions Coordinator:  ? ?CIR consult received. At this time, Pt. does not appear to demonstrate medical necessity to justify in hospital rehabilitation/CIR and her insurance will not approve a CIR admission for her diagnoses. Additionally, Pt. also did not demonstrate ability to tolerate intensity of CIR during yesterday's therapy sessions. I will not pursue a rehab consult for this Pt.   Recommend other rehab venues to be pursued.  CIR will sign off. Please contact me with any questions. ? ?Megan Salon, MS, CCC-SLP ?Rehab Admissions Coordinator  ?[phone] (celll) ?[phone] (office) ? ?

## 2022-03-19 DIAGNOSIS — H5789 Other specified disorders of eye and adnexa: Secondary | ICD-10-CM | POA: Diagnosis not present

## 2022-03-19 LAB — GLUCOSE, CAPILLARY
Glucose-Capillary: 151 mg/dL — ABNORMAL HIGH (ref 70–99)
Glucose-Capillary: 164 mg/dL — ABNORMAL HIGH (ref 70–99)
Glucose-Capillary: 164 mg/dL — ABNORMAL HIGH (ref 70–99)
Glucose-Capillary: 175 mg/dL — ABNORMAL HIGH (ref 70–99)
Glucose-Capillary: 178 mg/dL — ABNORMAL HIGH (ref 70–99)
Glucose-Capillary: 211 mg/dL — ABNORMAL HIGH (ref 70–99)

## 2022-03-19 LAB — CBC
HCT: 33.8 % — ABNORMAL LOW (ref 36.0–46.0)
Hemoglobin: 11.3 g/dL — ABNORMAL LOW (ref 12.0–15.0)
MCH: 29.5 pg (ref 26.0–34.0)
MCHC: 33.4 g/dL (ref 30.0–36.0)
MCV: 88.3 fL (ref 80.0–100.0)
Platelets: 181 10*3/uL (ref 150–400)
RBC: 3.83 MIL/uL — ABNORMAL LOW (ref 3.87–5.11)
RDW: 16.3 % — ABNORMAL HIGH (ref 11.5–15.5)
WBC: 16.7 10*3/uL — ABNORMAL HIGH (ref 4.0–10.5)
nRBC: 0.2 % (ref 0.0–0.2)

## 2022-03-19 LAB — MAGNESIUM: Magnesium: 1.9 mg/dL (ref 1.7–2.4)

## 2022-03-19 LAB — BASIC METABOLIC PANEL
Anion gap: 11 (ref 5–15)
BUN: 70 mg/dL — ABNORMAL HIGH (ref 8–23)
CO2: 26 mmol/L (ref 22–32)
Calcium: 9.6 mg/dL (ref 8.9–10.3)
Chloride: 105 mmol/L (ref 98–111)
Creatinine, Ser: 1.31 mg/dL — ABNORMAL HIGH (ref 0.44–1.00)
GFR, Estimated: 41 mL/min — ABNORMAL LOW (ref >60)
Glucose, Bld: 186 mg/dL — ABNORMAL HIGH (ref 70–99)
Potassium: 4.8 mmol/L (ref 3.5–5.1)
Sodium: 142 mmol/L (ref 135–145)

## 2022-03-19 LAB — C-REACTIVE PROTEIN: CRP: 5.5 mg/dL — ABNORMAL HIGH (ref <1.0)

## 2022-03-19 LAB — PHOSPHORUS: Phosphorus: 3 mg/dL (ref 2.5–4.6)

## 2022-03-19 LAB — SEDIMENTATION RATE: Sed Rate: 76 mm/hr — ABNORMAL HIGH (ref 0–22)

## 2022-03-19 NOTE — Progress Notes (Addendum)
Neurology Progress Note   S:// Patient is lying in bed in NAD, family at bedside and Lab tech is obtaining blood work. Patient is very lethargic, opens eyes to commands, follows commands and oriented to self, age and birthdate. She c/o being weak and tired, denies any pain   O:// Current vital signs: BP (!) 146/90 (BP Location: Left Arm)   Pulse 90   Temp 97.6 F (36.4 C) (Oral)   Resp 12   Ht 5' 2" (1.575 m)   Wt 80.1 kg   SpO2 95%   BMI 32.30 kg/m  Vital signs in last 24 hours: Temp:  [97.5 F (36.4 C)-98.5 F (36.9 C)] 97.6 F (36.4 C) (03/29 1621) Pulse Rate:  [90-100] 90 (03/29 1206) Resp:  [12-21] 12 (03/29 1206) BP: (130-163)/(90-99) 146/90 (03/29 1621) SpO2:  [95 %-98 %] 95 % (03/29 1621) Weight:  [80.1 kg] 80.1 kg (03/29 0325)  GENERAL: frail, elderly lethargic, awakens easily, in NAD HEENT: - Normocephalic and atraumatic, dry mm. Bilateral conjunctiva with erythema, periorbital swelling.  Scabbing on mucous membranes. Feeding tube in place  LUNGS - Clear to auscultation bilaterally with no wheezes CV - S1S2 RRR, no m/r/g, equal pulses bilaterally. ABDOMEN - Soft, nontender, nondistended with normoactive BS Ext: warm, well perfused, intact peripheral pulses, no  edema  NEURO:  Mental Status: AA&Ox2 to self, age, date of birth and year and hospital. Unable to name hospital Language: speech is soft and clear  Naming, repetition, fluency, and comprehension intact. Cranial Nerves: PERRL 2 mm/brisk. EOMI, visual fields full, no facial asymmetry, facial sensation intact, hearing intact, tongue/uvula/soft palate midline, normal sternocleidomastoid and trapezius muscle strength. No evidence of tongue atrophy or fibrillations Motor: able to lift all extremities off the bed. generalized weakness in all 4 extremities 3/5 in all 4 extremity Tone: is normal and bulk is normal Sensation- Intact to light touch bilaterally Coordination: FTN intact bilaterally.  Gait-  deferred  Medications  Current Facility-Administered Medications:    0.9 %  sodium chloride infusion (Manually program via Guardrails IV Fluids), , Intravenous, Once, Sommer, Steven E, MD   0.9 %  sodium chloride infusion, , Intravenous, Continuous, Smith, Rondell A, MD, Stopped at 03/14/22 0602   0.9 %  sodium chloride infusion, , Intravenous, PRN, Smith, Daniel C, MD, Last Rate: 10 mL/hr at 03/18/22 1400, Infusion Verify at 03/18/22 1400   acetaminophen (TYLENOL) tablet 650 mg, 650 mg, Per Tube, Q6H PRN **OR** acetaminophen (TYLENOL) suppository 650 mg, 650 mg, Rectal, Q6H PRN, Barr, Grace E, RPH   albuterol (PROVENTIL) (2.5 MG/3ML) 0.083% nebulizer solution 2.5 mg, 2.5 mg, Nebulization, Q6H PRN, Smith, Rondell A, MD, 2.5 mg at 03/14/22 0546   cefTRIAXone (ROCEPHIN) 2 g in sodium chloride 0.9 % 100 mL IVPB, 2 g, Intravenous, Q24H, Chand, Sudham, MD, Last Rate: 200 mL/hr at 03/19/22 1251, 2 g at 03/19/22 1251   chlorhexidine gluconate (MEDLINE KIT) (PERIDEX) 0.12 % solution 15 mL, 15 mL, Mouth Rinse, BID, Chand, Sudham, MD, 15 mL at 03/19/22 0840   Chlorhexidine Gluconate Cloth 2 % PADS 6 each, 6 each, Topical, Daily, Chand, Sudham, MD, 6 each at 03/19/22 1038   dexamethasone (DECADRON) tablet 4 mg, 4 mg, Per Tube, Q12H, 4 mg at 03/19/22 0838 **FOLLOWED BY** [START ON 03/20/2022] dexamethasone (DECADRON) tablet 2 mg, 2 mg, Per Tube, Q12H **FOLLOWED BY** [START ON 03/21/2022] dexamethasone (DECADRON) tablet 2 mg, 2 mg, Per Tube, Daily, Chand, Sudham, MD   famotidine (PEPCID) tablet 20 mg, 20 mg, Per Tube, Daily,   Candee Furbish, MD, 20 mg at 03/19/22 0841   feeding supplement (OSMOLITE 1.5 CAL) liquid 1,000 mL, 1,000 mL, Per Tube, Continuous, Jacky Kindle, MD, Last Rate: 55 mL/hr at 03/18/22 1509, 1,000 mL at 03/18/22 1509   feeding supplement (PROSource TF) liquid 45 mL, 45 mL, Per Tube, BID, Jacky Kindle, MD, 45 mL at 03/19/22 0838   free water 200 mL, 200 mL, Per Tube, Q8H, Candee Furbish, MD,  200 mL at 03/19/22 1510   insulin aspart (novoLOG) injection 0-9 Units, 0-9 Units, Subcutaneous, Q4H, Cecilie Lowers T, MD, 2 Units at 03/19/22 1702   levothyroxine (SYNTHROID) tablet 150 mcg, 150 mcg, Per Tube, Once per day on Sun Mon Wed Thu Fri, Chand, Sudham, MD, 150 mcg at 03/19/22 6213   levothyroxine (SYNTHROID) tablet 225 mcg, 225 mcg, Per Tube, Once per day on Tue Sat, Chand, Sudham, MD, 225 mcg at 03/18/22 1038   lidocaine HCl (PF) (XYLOCAINE) 2 % injection 0-20 mL, 0-20 mL, Intradermal, Once PRN, Candee Furbish, MD   metoprolol tartrate (LOPRESSOR) tablet 12.5 mg, 12.5 mg, Per Tube, BID, Henri Medal, RPH, 12.5 mg at 03/19/22 0865   mirtazapine (REMERON) tablet 7.5 mg, 7.5 mg, Per Tube, QHS, Henri Medal, RPH, 7.5 mg at 03/18/22 2212   phenol (CHLORASEPTIC) mouth spray 1 spray, 1 spray, Mouth/Throat, PRN, Fuller Plan A, MD, 1 spray at 03/13/22 1810   polyvinyl alcohol (LIQUIFILM TEARS) 1.4 % ophthalmic solution 1 drop, 1 drop, Both Eyes, PRN, Cecilie Lowers T, MD, 1 drop at 03/19/22 1548   sodium chloride flush (NS) 0.9 % injection 10-40 mL, 10-40 mL, Intracatheter, Q12H, Candee Furbish, MD, 10 mL at 03/19/22 1000   sodium chloride flush (NS) 0.9 % injection 10-40 mL, 10-40 mL, Intracatheter, PRN, Candee Furbish, MD   sodium chloride flush (NS) 0.9 % injection 3 mL, 3 mL, Intravenous, Q12H, Smith, Rondell A, MD, 3 mL at 03/19/22 1000  Imaging   CT cervical spine 3/17: No acute displaced fracture or traumatic listhesis of the cervical spine.  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   Recent  events: 3/2-3/5, admitted to the hospital with syncopal episode in the setting of UTI. 3/17-3/19, failure to thrive symptoms.  CT scan chest abdomen pelvis negative for malignancy.  Given prednisone on discharge for appetite stimulation. 3/23, presented back to the hospital with left eye swelling, more slurred speech, neck swelling with difficulty swallowing and intermittent coughing while eating.  Patient noted to be breaking into rashes and progressive swelling of the left eye and difficulty breathing. Morning of 3/24, facial swelling worsened and she started to have tongue swelling along with tachypnea and difficulty breathing 3/25-3/27 intubated   Exam reveals generalized weakness and deconditioning, no hyperreflexia or tremors noted. CT scan last admission with no acute process  Recommendations: -PT/OT  -Nutrition consult   Beulah Gandy ACNPC-Ag    NEUROHOSPITALIST ADDENDUM Performed a face to face diagnostic evaluation.   I have reviewed the contents of history and physical exam as documented by PA/ARNP/Resident and  agree with above documentation.  I have discussed and formulated the above plan as documented. Edits to the note have been made as needed.  Impression/Key exam findings/Plan: husband at bedside reports waxing and waning mentation but overall, she is improved and much more responsive. She is somnolent but able to follow commands, oriented to self, place, month, year, she is also able to do simple calculations, answer questions appropriately. Althou only speaks in 1 word or short phrase due to dry mouth, sore tongue with blisters. Her strength is 5/5 to hand grip bilaterally and 5/5 to ankle dorsiflexion and plantar flexion. She does have some proximal muscle weakness but that is probably deconditioning. She would benefit from Pt/OT and nutrition consult.   , MD [facility] [phone]   If 7pm to 7am, please call on call as listed on  AMION.       

## 2022-03-19 NOTE — Progress Notes (Signed)
Nutrition Follow-up ? ?DOCUMENTATION CODES:  ? ?Severe malnutrition in context of acute illness/injury ? ?INTERVENTION:  ? ?Tube Feeds via Cortrak:  ?Osmolite 1.5 @ 55 mL/hr (1320 mL/day) ?45 mL ProSource TF - BID ?Provides 2060 kcal, 105 gm protein, and 1006 mL free water daily. ? ?NUTRITION DIAGNOSIS:  ? ?Severe Malnutrition related to acute illness as evidenced by moderate muscle depletion, moderate fat depletion, percent weight loss. - Ongoing, being addressed via TF ? ?GOAL:  ? ?Patient will meet greater than or equal to 90% of their needs - Progressing, being addressed via TF ? ?MONITOR:  ? ?Diet advancement, Labs, Weight trends, Skin ? ?REASON FOR ASSESSMENT:  ? ?Malnutrition Screening Tool ?  ? ?ASSESSMENT:  ? ?81 y.o. presented to the ED with L eye swelling. Recent admission 3/2-3/5 for UTI and syncopal episode and 3/17-3/19 for FTT symptoms. PMH includes CHF, CKD III, malnutrition, and HTN. Pt admitted with eye swelling, rash, dysphagia, and AKI on CKD.   ? ?3/24 - transferred to ICU due to respiratory distress; Cortrak placed (tip placed) ?3/25 - intubated ?3/27 - Extubated  ?3/28 - Transferred to floor ?3/30 - MBS w/ SLP, recommend NPO  ? ?Pt sleeping in bed. Family friend at bedside and is able to provide some information.  ?Family friend reports that pt had just returned from swallow study and that she was more awake this morning prior.  ? ?Reports that pt was hardly eating anything at home due to not having an appetite. Reports that she would cook them dinner and she would eat some of it. Family friend believed that she had ben drinking some sort of ONS at home, but was not completely sure.  ? ?Medications reviewed and include: Decadron, Pepcid, SSI 0-9 units q4h, Remeron, IV antibiotics  ?Labs reviewed: BUN 70, Creatinine 1.31 ? ?Diet Order:   ?Diet Order   ? ?       ?  Diet NPO time specified Except for: Ice Chips  Diet effective now       ?  ? ?  ?  ? ?  ? ?EDUCATION NEEDS:  ? ?Not appropriate for  education at this time ? ?Skin:  Skin Assessment: Reviewed RN Assessment ? ?Last BM:  3/29 via flexiseal ? ?Height:  ?Ht Readings from Last 1 Encounters:  ?03/15/22 5\' 2"  (1.575 m)  ? ?Weight:  ?Wt Readings from Last 1 Encounters:  ?03/20/22 78 kg  ? ?Ideal Body Weight:  56.8 kg ? ?BMI:  Body mass index is 31.45 kg/m?. ? ?Estimated Nutritional Needs:  ?Kcal:  2000-2200 ?Protein:  100-115 grams ?Fluid:  >/= 2 L ? ? ? ?03/22/22 RD, LDN ?Clinical Dietitian ?See AMiON for contact information.  ? ?

## 2022-03-19 NOTE — Progress Notes (Signed)
Physical Therapy Treatment ?Patient Details ?Name: Lauren Henry ?MRN: [id number] ?DOB: Oct 07, 1941 ?Today's Date: 03/19/2022 ? ? ?History of Present Illness 81 y.o female  presents to the ED via ambulance 3/23 with a chief complaint of right arm swelling and left eye swollen x a few days.   Allergic reaction?  PMH:   complete heart block, HTN, Afib on eliquis ? ?  ?PT Comments  ? ? Patient arousable,but remained lethargic throughout session. Essentially non-verbal except for stating her name. Requires +2 max assist for bed mobility and min assist to maintain sitting at EOB due to posterior lean. Unable to stand enough to use stedy for transfer OOB. Patient returned to bed in upright position.  ?   ?Recommendations for follow up therapy are one component of a multi-disciplinary discharge planning process, led by the attending physician.  Recommendations may be updated based on patient status, additional functional criteria and insurance authorization. ? ?Follow Up Recommendations ? Skilled nursing-short term rehab (<3 hours/day) ?  ?  ?Assistance Recommended at Discharge Frequent or constant Supervision/Assistance  ?Patient can return home with the following Assistance with cooking/housework;Assist for transportation;Help with stairs or ramp for entrance;Two people to help with walking and/or transfers;Two people to help with bathing/dressing/bathroom;Direct supervision/assist for medications management;Direct supervision/assist for financial management ?  ?Equipment Recommendations ? Wheelchair (measurements PT);Wheelchair cushion (measurements PT);Hospital bed (if discharges home and not SNF)  ?  ?Recommendations for Other Services   ? ? ?  ?Precautions / Restrictions Precautions ?Precautions: Fall;Other (comment) ?Precaution Comments: cortrak, rectal tube, watch HR ?Restrictions ?Weight Bearing Restrictions: No  ?  ? ?Mobility ? Bed Mobility ?Overal bed mobility: Needs Assistance ?Bed Mobility: Rolling, Sidelying to  Sit, Sit to Sidelying ?Rolling: Max assist ?Sidelying to sit: Max assist, +2 for physical assistance ?  ?  ?Sit to sidelying: +2 for physical assistance, Max assist ?General bed mobility comments: following commands with rolling to help bend her legs and reach across her body; assisted with taking legs over EOB and pushing with RUE to raise her torso; lowered onto her rt elbow as returning to sidelying ?  ? ?Transfers ?Overall transfer level: Needs assistance ?Equipment used: Ambulation equipment used ?Transfers: Sit to/from Stand ?Sit to Stand: Max assist, +2 physical assistance, +2 safety/equipment, From elevated surface (unable to achieve standing) ?  ?  ?  ?  ?  ?General transfer comment: attempted standing x 2 with pad under hips and pt holding onto stedy; achieved ~2/3 standing (not enough to get flaps under her bottom) ?Transfer via Lift Equipment: Stedy ? ?Ambulation/Gait ?  ?  ?  ?  ?  ?  ?  ?  ? ? ?Stairs ?  ?  ?  ?  ?  ? ? ?Wheelchair Mobility ?  ? ?Modified Rankin (Stroke Patients Only) ?  ? ? ?  ?Balance Overall balance assessment: Needs assistance ?Sitting-balance support: Bilateral upper extremity supported, Feet unsupported ?Sitting balance-Leahy Scale: Poor ?Sitting balance - Comments: able to statically sit with up to Min A for posterior lean correction. pt able to demo correction of posterior lean with cues to lean forward; sat EOB ~8 minutes ?  ?  ?  ?  ?  ?  ?  ?  ?  ?  ?  ?  ?  ?  ?  ?  ? ?  ?Cognition Arousal/Alertness: Lethargic ?Behavior During Therapy: Flat affect ?Overall Cognitive Status: Difficult to assess ?  ?  ?  ?  ?  ?  ?  ?  ?  ?  ?  ?  ?  ?  ?  ?  ?  ?  ?  ? ?  ?  Exercises   ? ?  ?General Comments   ?  ?  ? ?Pertinent Vitals/Pain Pain Assessment ?Pain Assessment: PAINAD ?Breathing: normal ?Negative Vocalization: none ?Facial Expression: smiling or inexpressive ?Body Language: relaxed ?Consolability: no need to console ?PAINAD Score: 0  ? ? ?Home Living   ?  ?  ?  ?  ?  ?  ?  ?  ?   ?   ?  ?Prior Function    ?  ?  ?   ? ?PT Goals (current goals can now be found in the care plan section) Acute Rehab PT Goals ?Patient Stated Goal: home with husband ?Time For Goal Achievement: 03/28/22 ?Potential to Achieve Goals: Good ?Progress towards PT goals: Not progressing toward goals - comment ? ?  ?Frequency ? ? ? Min 3X/week ? ? ? ?  ?PT Plan Discharge plan needs to be updated  ? ? ?Co-evaluation   ?  ?  ?  ?  ? ?  ?AM-PAC PT "6 Clicks" Mobility   ?Outcome Measure ? Help needed turning from your back to your side while in a flat bed without using bedrails?: A Lot ?Help needed moving from lying on your back to sitting on the side of a flat bed without using bedrails?: Total ?Help needed moving to and from a bed to a chair (including a wheelchair)?: Total ?Help needed standing up from a chair using your arms (e.g., wheelchair or bedside chair)?: Total ?Help needed to walk in hospital room?: Total ?Help needed climbing 3-5 steps with a railing? : Total ?6 Click Score: 7 ? ?  ?End of Session Equipment Utilized During Treatment: Oxygen ?Activity Tolerance: Patient limited by lethargy ?Patient left: with call bell/phone within reach;in bed;with bed alarm set ?Nurse Communication: Mobility status;Other (comment) (sacral pad removed 2/2 saturated) ?PT Visit Diagnosis: Muscle weakness (generalized) (M62.81);Other abnormalities of gait and mobility (R26.89) ?  ? ? ?Time: 1000-1036 ?PT Time Calculation (min) (ACUTE ONLY): 36 min ? ?Charges:  $Therapeutic Activity: 23-37 mins          ?          ? ? ?Arby Barrette, PT ?Acute Rehabilitation Services  ?Pager [phone] ?Office [phone] ? ? ? ?Jeanie Cooks Franki Alcaide ?03/19/2022, 10:48 AM ? ?

## 2022-03-19 NOTE — NC FL2 (Signed)
?[place] MEDICAID FL2 LEVEL OF CARE SCREENING TOOL  ?  ? ?IDENTIFICATION  ?Patient Name: ?Lauren Henry Birthdate: [birth year] Sex: female Admission Date (Current Location): ?03/13/2022  ?[place] and [place] Number: ? [place] ?  Facility and Address:  ?[facility], [place] [address] [place], [place] [address] ?     Provider Number: ?[id number]  ?Attending Physician Name and Address:  ?Barb Merino, MD ? Relative Name and Phone Number:  ?  ?   ?Current Level of Care: ?Hospital Recommended Level of Care: ?[place] Prior Approval Number: ?  ? ?Date Approved/Denied: ?  PASRR Number: ?[id number] A ? ?Discharge Plan: ?SNF ?  ? ?Current Diagnoses: ?Patient Active Problem List  ? Diagnosis Date Noted  ? Angio-edema   ? Acute kidney injury superimposed on chronic kidney disease ([place]) 03/13/2022  ? Neck and right arm swelling 03/13/2022  ? Rash 03/13/2022  ? Dysphagia 03/13/2022  ? Swelling of right upper extremity 03/13/2022  ? Eye swelling, left 03/13/2022  ? Chronic diastolic CHF (congestive heart failure) ([place]) 03/13/2022  ? Protein-calorie malnutrition, severe 03/09/2022  ? Failure to thrive in adult 03/07/2022  ? 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 Microscopic hematuria 03/07/2022  ? Stage 3b chronic kidney disease (CKD) ([place]) 02/21/2022  ? Syncope and collapse 02/20/2022  ? UTI (urinary tract infection) 02/20/2022  ? Syncope 02/20/2022  ? Postural dizziness with presyncope 02/18/2022  ? Weakness 02/17/2022  ? Orthostatic hypotension 02/17/2022  ? Glaucoma 02/17/2022  ? Gastroenteropathy 08/28/2020  ? AKI (acute kidney injury) ([place]) 08/27/2020  ? Nausea vomiting and diarrhea 08/23/2020  ? Chronic anticoagulation 11/24/2018  ? CAD in native artery 11/24/2018  ? Chronic renal disease, stage III ([place]) 11/24/2018  ? Anemia 11/24/2018  ? Elevated troponin with new LBBB   ? Urinary urgency   ? Urinary frequency   ? Presence of permanent cardiac pacemaker   ? Joint pain   ? History of  colon polyps   ? History of blood transfusion   ? Diarrhea   ? Arthritis   ? 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 Preop cardiovascular exam 10/18/2015  ? Chest pain 11/09/2014  ? PAF (paroxysmal atrial fibrillation) ([place]) 11/09/2014  ? Essential hypertension 10/30/2014  ? Hyperlipidemia 10/30/2014  ? Hypothyroidism 10/30/2014  ? CHB (complete heart block) ([place]) s/p PPM 10/30/2014  ? 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 Right-sided low back pain with right-sided sciatica 09/14/2014  ? Knee pain 01/31/2013  ? Back pain 08/27/2012  ? Right groin pain 01/13/2012  ? Anxiety disorder 09/23/2009  ? ? ?Orientation RESPIRATION BLADDER Height & Weight   ?  ?Self, Time, Situation, Place ? Normal Continent Weight: 176 lb 9.4 oz (80.1 kg) ?Height:  $RemoveB'5\' 2"'zgFnbkLS$  (157.5 cm)  ?BEHAVIORAL SYMPTOMS/MOOD NEUROLOGICAL BOWEL NUTRITION STATUS  ?    Incontinent Diet (See dc summary)  ?AMBULATORY STATUS COMMUNICATION OF NEEDS Skin   ?Extensive Assist Verbally Surgical wounds (Closed incision on arm and thigh) ?  ?  ?  ?    ?     ?     ? ? ?Personal Care Assistance Level of Assistance  ?Bathing, Feeding, Dressing Bathing Assistance: Maximum assistance ?Feeding assistance: Limited assistance ?Dressing Assistance: Limited assistance ?   ? ?Functional Limitations Info  ?Sight Sight Info: Impaired ?  ?   ? ? ?SPECIAL CARE FACTORS FREQUENCY  ?PT (By licensed PT), OT (By licensed OT)   ?  ?PT Frequency: 5x/week ?OT Frequency: 5x/week ?  ?  ?  ?   ? ? ?  Contractures Contractures Info: Not present  ? ? ?Additional Factors Info  ?Code Status, Allergies Code Status Info: Full ?Allergies Info: Clindamycin/lincomycin, Methimazole, Olmesartan, Amlodipine ?  ?  ?  ?   ? ?Current Medications (03/19/2022):  This is the current hospital active medication list ?Current Facility-Administered Medications  ?Medication Dose Route Frequency Provider Last Rate Last Admin  ? 0.9 %  sodium chloride infusion (Manually program via Guardrails IV  Fluids)   Intravenous Once Anders Simmonds, MD      ? 0.9 %  sodium chloride infusion   Intravenous Continuous Norval Morton, MD   Stopped at 03/14/22 0602  ? 0.9 %  sodium chloride infusion   Intravenous PRN Candee Furbish, MD 10 mL/hr at 03/18/22 1400 Infusion Verify at 03/18/22 1400  ? acetaminophen (TYLENOL) tablet 650 mg  650 mg Per Tube Q6H PRN Henri Medal, RPH      ? Or  ? acetaminophen (TYLENOL) suppository 650 mg  650 mg Rectal Q6H PRN Henri Medal, RPH      ? albuterol (PROVENTIL) (2.5 MG/3ML) 0.083% nebulizer solution 2.5 mg  2.5 mg Nebulization Q6H PRN Fuller Plan A, MD   2.5 mg at 03/14/22 0546  ? cefTRIAXone (ROCEPHIN) 2 g in sodium chloride 0.9 % 100 mL IVPB  2 g Intravenous Q24H Jacky Kindle, MD 200 mL/hr at 03/19/22 1251 2 g at 03/19/22 1251  ? chlorhexidine gluconate (MEDLINE KIT) (PERIDEX) 0.12 % solution 15 mL  15 mL Mouth Rinse BID Jacky Kindle, MD   15 mL at 03/19/22 0840  ? Chlorhexidine Gluconate Cloth 2 % PADS 6 each  6 each Topical Daily Jacky Kindle, MD   6 each at 03/19/22 1038  ? dexamethasone (DECADRON) tablet 4 mg  4 mg Per Tube Q12H Jacky Kindle, MD   4 mg at 03/19/22 9562  ? Followed by  ? [START ON 03/20/2022] dexamethasone (DECADRON) tablet 2 mg  2 mg Per Tube Q12H Jacky Kindle, MD      ? Followed by  ? Derrill Memo ON 03/21/2022] dexamethasone (DECADRON) tablet 2 mg  2 mg Per Tube Daily Jacky Kindle, MD      ? famotidine (PEPCID) tablet 20 mg  20 mg Per Tube Daily Candee Furbish, MD   20 mg at 03/19/22 0841  ? feeding supplement (OSMOLITE 1.5 CAL) liquid 1,000 mL  1,000 mL Per Tube Continuous Jacky Kindle, MD 55 mL/hr at 03/18/22 1509 1,000 mL at 03/18/22 1509  ? feeding supplement (PROSource TF) liquid 45 mL  45 mL Per Tube BID Jacky Kindle, MD   45 mL at 03/19/22 0838  ? free water 200 mL  200 mL Per Tube Q8H Candee Furbish, MD   200 mL at 03/19/22 1510  ? insulin aspart (novoLOG) injection 0-9 Units  0-9 Units Subcutaneous Q4H Elmer Sow, MD   2 Units at  03/19/22 1245  ? levothyroxine (SYNTHROID) tablet 150 mcg  150 mcg Per Tube Once per day on Sun Mon Wed Thu Fri Chand, Sudham, MD   150 mcg at 03/19/22 1308  ? levothyroxine (SYNTHROID) tablet 225 mcg  225 mcg Per Tube Once per day on Tue Sat Jacky Kindle, MD   225 mcg at 03/18/22 1038  ? lidocaine HCl (PF) (XYLOCAINE) 2 % injection 0-20 mL  0-20 mL Intradermal Once PRN Candee Furbish, MD      ? metoprolol tartrate (LOPRESSOR) tablet 12.5 mg  12.5 mg Per Tube BID Henri Medal, [facility]  12.5 mg at 03/19/22 0839  ? mirtazapine (REMERON) tablet 7.5 mg  7.5 mg Per Tube QHS Henri Medal, RPH   7.5 mg at 03/18/22 2212  ? phenol (CHLORASEPTIC) mouth spray 1 spray  1 spray Mouth/Throat PRN Fuller Plan A, MD   1 spray at 03/13/22 1810  ? polyvinyl alcohol (LIQUIFILM TEARS) 1.4 % ophthalmic solution 1 drop  1 drop Both Eyes PRN Elmer Sow, MD   1 drop at 03/19/22 1548  ? sodium chloride flush (NS) 0.9 % injection 10-40 mL  10-40 mL Intracatheter Q12H Candee Furbish, MD   10 mL at 03/19/22 1000  ? sodium chloride flush (NS) 0.9 % injection 10-40 mL  10-40 mL Intracatheter PRN Candee Furbish, MD      ? sodium chloride flush (NS) 0.9 % injection 3 mL  3 mL Intravenous Q12H Smith, Rondell A, MD   3 mL at 03/19/22 1000  ? ? ? ?Discharge Medications: ?Please see discharge summary for a list of discharge medications. ? ?Relevant Imaging Results: ? ?Relevant Lab Results: ? ? ?Additional Information ?SSN: [id number]. Pfizer vaccines 10/04/21, 05/29/21, 09/25/20, 01/31/20, 01/10/20 ? ?Lissa Morales Pennye Beeghly, LCSW ? ? ? ? ?

## 2022-03-19 NOTE — Progress Notes (Signed)
Speech Language Pathology Treatment:    ?Patient Details ?Name: Lauren Henry ?MRN: [id number] ?DOB: 12-May-1941 ?Today's Date: 03/19/2022 ?Time: 1430-1500 ?SLP Time Calculation (min) (ACUTE ONLY): 30 min ? ?Assessment / Plan / Recommendation ?Clinical Impression ? Patient seen by SLP for skilled treatment session focused on dysphagia goals. Patient's spouse in room during session. SLP provided continued education regarding PO safety and plan for objective swallow study when she is alert enough. SLP then performed oral care, removing moderate amount of dried hardened secretions from soft palate and mild amount in buccal cavities. Overall her oral mucosa looks less irritated but she continues to not be able to manage secretions. SLP then administered ice chips one at a time and patient lightly masticated but mainly would let melt in mouth. She demonstrated swallow initiation which appeared difficult (Cortrak in place) but with only inconsistent throat clearing, one of which times was productive. MD entered room to speak with family and SLP discussed plan for swallow study. After discussion, spouse felt that FEES would be best option and SLP is in agreement with this. Plan is to check in AM and for patient to participate in FEES if alert enough. SLP is also recommending small amount of ice chips PRN after oral care. ? ?  ?HPI HPI: Patient is an 81 y.o. female with PMH: afib, complete heart block s/p pacemaker (not MRI compatable), nonobstructive CAD, CKD stage III who presented to the hospital on 3/23 with left eye swelling. Patient had been recently hospitalized from 3/2 to 3/5 after having syncopal episode in setting of UTI where she was taken off blood pressure medications and treated with antibiotics. She was then hospitalized 3/17-3/19 for FTT symptoms with her husband reporting a progressive decline and at least 30lb weight loss since end of December. There was concern for possible underlying malignany but CT chest,  abdomen and pelvis did not show any acute cause for her symptoms. Scans noted an 8 mm noncalcified nodule of the right lower lobe for which repeat CT scan recommended in 2 to 3 months.  She was also noted to have new tremor that was not thought to be parkinsonian in nature with mildly elevated free T4, but levothyroxine dose was deferred to PCP. In ED, patient was afebrile and vital signs relatively stable, CXR showed no acute disease. On 3/24 RN called into patient's room by husband due to concerns of trouble breathing. Rapid Response was called and patient started on 2L [place] and transfered to unit 2M. on 3/25 patient with increased WOB and CXR showing interstitial changes, possible aspiration. She was intubated at [place] on 3/25 and extubated to 4L via [place] on 3/27 at 1018. ?  ?   ?SLP Plan ? Continue with current plan of care ? ?  ?  ?Recommendations for follow up therapy are one component of a multi-disciplinary discharge planning process, led by the attending physician.  Recommendations may be updated based on patient status, additional functional criteria and insurance authorization. ?  ? ?Recommendations  ?Diet recommendations: NPO;Other(comment) (ice chips PRN) ?Medication Administration: Via alternative means  ?   ?    ?   ? ? ? ? Oral Care Recommendations: Oral care QID;Staff/trained caregiver to provide oral care ?Follow Up Recommendations: Acute inpatient rehab (3hours/day) ?Assistance recommended at discharge: Frequent or constant Supervision/Assistance ?SLP Visit Diagnosis: Dysphagia, unspecified (R13.10) ?Plan: Continue with current plan of care ? ? ? ? ?  ?  ? ? ?Sonia Baller, MA, CCC-SLP ?Speech Therapy ? ?

## 2022-03-19 NOTE — Care Management Important Message (Signed)
Important Message ? ?Patient Details  ?Name: Lauren Henry ?MRN: [id number] ?Date of Birth: 1941-11-07 ? ? ?Medicare Important Message Given:  Yes ? ? ? ? ?Lauren Henry ?03/19/2022, 12:44 PM ?

## 2022-03-19 NOTE — Plan of Care (Signed)

## 2022-03-19 NOTE — TOC Initial Note (Signed)
Transition of Care (TOC) - Initial/Assessment Note  ? ? ?Patient Details  ?Name: Lauren Henry ?MRN: [id number] ?Date of Birth: [birth year] ? ?Transition of Care (TOC) CM/SW Contact:    ?Benard Halsted, LCSW ?Phone Number: ?03/19/2022, 4:05 PM ? ?Clinical Narrative:                 ?CSW left voicemail for patient's spouse to discuss SNF recommendation.  ? ?Expected Discharge Plan: [place] ?Barriers to Discharge: Ship broker, Continued Medical Work up, SNF Pending bed offer ? ? ?Patient Goals and CMS Choice ?Patient states their goals for this hospitalization and ongoing recovery are:: Rehab ?CMS Medicare.gov Compare Post Acute Care list provided to:: Patient ?Choice offered to / list presented to : Patient, Spouse ? ?Expected Discharge Plan and Services ?Expected Discharge Plan: [place] ?In-house Referral: Clinical Social Work ?  ?  ?Living arrangements for the past 2 months: [place] ?                ?  ?  ?  ?  ?  ?  ?  ?  ?  ?  ? ?Prior Living Arrangements/Services ?Living arrangements for the past 2 months: [place] ?Lives with:: Spouse ?Patient language and need for interpreter reviewed:: Yes ?       ?Need for Family Participation in Patient Care: Yes (Comment) ?Care giver support system in place?: Yes (comment) ?  ?Criminal Activity/Legal Involvement Pertinent to Current Situation/Hospitalization: No - Comment as needed ? ?Activities of Daily Living ?Home Assistive Devices/Equipment: Eyeglasses, Shower chair with back, Walker (specify type) ?ADL Screening (condition at time of admission) ?Patient's cognitive ability adequate to safely complete daily activities?: Yes ?Is the patient deaf or have difficulty hearing?: No ?Does the patient have difficulty seeing, even when wearing glasses/contacts?: Yes ?Does the patient have difficulty concentrating, remembering, or making decisions?: No ?Patient able to express need for assistance with ADLs?:  Yes ?Does the patient have difficulty dressing or bathing?: Yes ?Independently performs ADLs?: Yes (appropriate for developmental age) ?Does the patient have difficulty walking or climbing stairs?: Yes ?Weakness of Legs: Both ?Weakness of Arms/Hands: Both ? ?Permission Sought/Granted ?Permission sought to share information with : Facility Sport and exercise psychologist, Family Supports ?  ? Share Information with NAME: Quillian Quince ?   ? Permission granted to share info w Relationship: Spouse ? Permission granted to share info w Contact Information: [phone] ? ?Emotional Assessment ?Appearance:: Appears stated age ?  ?  ?Orientation: : Oriented to Self, Oriented to Place, Oriented to  Time, Oriented to Situation (lethargic) ?Alcohol / Substance Use: Not Applicable ?Psych Involvement: No (comment) ? ?Admission diagnosis:  Elevated LFTs [R79.89] ?Eye swelling, left [H57.89] ?AKI (acute kidney injury) ([place]) [N17.9] ?Acute kidney injury superimposed on chronic kidney disease ([place]) [N17.9, N18.9] ?Patient Active Problem List  ? Diagnosis Date Noted  ? Angio-edema   ? Acute kidney injury superimposed on chronic kidney disease ([place]) 03/13/2022  ? Neck and right arm swelling 03/13/2022  ? Rash 03/13/2022  ? Dysphagia 03/13/2022  ? Swelling of right upper extremity 03/13/2022  ? Eye swelling, left 03/13/2022  ? Chronic diastolic CHF (congestive heart failure) ([place]) 03/13/2022  ? Protein-calorie malnutrition, severe 03/09/2022  ? Failure to thrive in adult 03/07/2022  ? 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 Microscopic hematuria 03/07/2022  ? Stage 3b chronic kidney disease (CKD) ([place]) 02/21/2022  ? Syncope and collapse 02/20/2022  ? UTI (urinary tract infection) 02/20/2022  ? Syncope 02/20/2022  ? Postural  dizziness with presyncope 02/18/2022  ? Weakness 02/17/2022  ? Orthostatic hypotension 02/17/2022  ? Glaucoma 02/17/2022  ? Gastroenteropathy 08/28/2020  ? AKI (acute kidney injury) ([place]) 08/27/2020  ? Nausea vomiting and diarrhea  08/23/2020  ? Chronic anticoagulation 11/24/2018  ? CAD in native artery 11/24/2018  ? Chronic renal disease, stage III ([place]) 11/24/2018  ? Anemia 11/24/2018  ? Elevated troponin with new LBBB   ? Urinary urgency   ? Urinary frequency   ? Presence of permanent cardiac pacemaker   ? Joint pain   ? History of colon polyps   ? History of blood transfusion   ? Diarrhea   ? Arthritis   ? 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 Preop cardiovascular exam 10/18/2015  ? Chest pain 11/09/2014  ? PAF (paroxysmal atrial fibrillation) ([place]) 11/09/2014  ? Essential hypertension 10/30/2014  ? Hyperlipidemia 10/30/2014  ? Hypothyroidism 10/30/2014  ? CHB (complete heart block) ([place]) s/p PPM 10/30/2014  ? 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 Right-sided low back pain with right-sided sciatica 09/14/2014  ? Knee pain 01/31/2013  ? Back pain 08/27/2012  ? Right groin pain 01/13/2012  ? Anxiety disorder 09/23/2009  ? ?PCP:  Haywood Pao, MD ?Pharmacy:   ?[place], [place] ?[place] [address] ?Phone: [phone] Fax: [phone] ? ?[facility] [phone] - [place], [place] ?[place] [address] ?Phone: [phone] Fax: [phone] ? ? ? ? ?Social Determinants of Health (SDOH) Interventions ?  ? ?Readmission Risk Interventions ?   ? View : No data to display.  ?  ?  ?  ? ? ? ?

## 2022-03-19 NOTE — Progress Notes (Signed)
?PROGRESS NOTE ? ? ? ?Lauren Henry  [id number] DOB: 1941/04/08 DOA: 03/13/2022 ?PCP: Haywood Pao, MD  ? ? ?Brief Narrative:  ?81 year old female with history of Boswell A-fib, complete heart block status post pacemaker which is not compatible to MRI, nonobstructive CAD, diastolic dysfunction, hypothyroidism, chronic kidney disease stage IIIb, recent multiple hospitalization presented to the ER on 3/23 with left eye swelling for 1 day. ?3/2-3/5, admitted to the hospital with syncopal episode in the setting of UTI. ?3/17-3/19, failure to thrive symptoms.  CT scan chest abdomen pelvis negative for malignancy.  Given prednisone on discharge for appetite stimulation. ?3/23, presented back to the hospital with left eye swelling, more slurred speech, neck swelling with difficulty swallowing and intermittent coughing while eating.  Patient noted to be breaking into rashes and progressive swelling of the left eye and difficulty breathing. ?At the emergency room afebrile with stable vital signs.  Creatinine 2.05.  Mildly elevated AST and ALT.  Chest x-ray was normal.  She was given 500 cc of normal saline and admitted to the hospital. ?3/24, she was on the medical floor.  Facial swelling worsened and she started having tongue swelling along with tachypnea and difficulty breathing.  She was found with edematous tongue, I started on high-dose steroids and admitted to the intensive care unit. ? ? ?Assessment & Plan: ?  ?Angioedema rash in the setting of clindamycin use: Likely due to reaction to clindamycin use, IgA levels within normal limit.  Group A Streptococcus negative.  Neck swelling improving.  CT scan with no complications. ?Taper off dexamethasone.  Continue Pepcid. ? ?Acute hypoxemic respiratory failure secondary to above: Intubated to protect airway.  Extubated 3/27 and currently mostly on room air. ?Treated with Rocephin for presumed aspiration pneumonia, continue and complete 7 days of therapy.   Respiratory cultures positive for Klebsiella. ? ?Dysphagia: Secondary to above.  Speech therapy following.  Now has core track tube.  Continue. ? ?Anemia of acute blood loss: Received 1 unit of PRBC with appropriate response. ? ?AKI on CKD stage IIIb: Treated with fluids.  Discontinue.  Advance diet through the core track tube.  Recheck tomorrow morning. ? ?Proximal A-fib, complete heart block status post pacemaker: Sinus rhythm.  Rate controlled.  On metoprolol.  Therapeutic on Eliquis. ? ?Hypothyroidism: Synthroid replacement on high doses.  Continue. ? ? ?DVT prophylaxis: apixaban (ELIQUIS) tablet 2.5 mg Start: 03/20/22 1000 ?apixaban (ELIQUIS) tablet 2.5 mg  ? ?Code Status: Full code. ?Family Communication: None. ?Disposition Plan: Status is: Inpatient ?Remains inpatient appropriate because: Unable to eat. ?  ? ? ?Consultants:  ?Neurology ?Critical care ? ?Procedures:  ?Multiple as above. ? ?Antimicrobials:  ?Currently on Rocephin day 5/7. ? ? ?Subjective: ?Patient seen and examined.  Denies any overnight events.  She thinks she can swallow.  Feels weak and tired.  Blood pressure stable.  No family at the bedside.  Vision is clear. ? ?Objective: ?Vitals:  ? 03/19/22 0740 03/19/22 0800 03/19/22 0839 03/19/22 1206  ?BP: (!) 159/91  (!) 161/96 (!) 150/92  ?Pulse:   95 90  ?Resp:    12  ?Temp: 98.5 ?F (36.9 ?C)   97.6 ?F (36.4 ?C)  ?TempSrc: Oral   Oral  ?SpO2:  97%  96%  ?Weight:      ?Height:      ? ? ?Intake/Output Summary (Last 24 hours) at 03/19/2022 1350 ?Last data filed at 03/19/2022 1038 ?Gross per 24 hour  ?Intake 344.99 ml  ?Output 1750 ml  ?Net -1405.01 ml  ? ?  Filed Weights  ? 03/17/22 0120 03/18/22 0352 03/19/22 0325  ?Weight: 76.4 kg 76.1 kg 80.1 kg  ? ? ?Examination: ? ?General exam: Appears calm and comfortable  ?Frail and debilitated. ?She does have some periorbital ecchymosis, improved than before. ?Raised papular rash on the trunk, scabbing. ?Respiratory system: No added sounds.  Some conducted upper  airway sounds. ?Cardiovascular system: S1 & S2 heard, RRR.  Pacemaker in place. ?Gastrointestinal system: Soft.  Nontender.  Bowel sound present. ?Central nervous system: Alert and oriented.  Frail and debilitated.  Flat affect.  Sick looking.  On room air. ?Extremities: Symmetric 5 x 5 power.  Moves all extremities. ? ? ? ?Data Reviewed: I have personally reviewed following labs and imaging studies ? ?CBC: ?Recent Labs  ?Lab 03/13/22 ?[id number] 03/14/22 ?0022 03/15/22 ?[id number] 03/16/22 ?[id number] 03/16/22 ?[id number] 03/16/22 ?1750 03/17/22 ?0202 03/18/22 ?[id number] 03/19/22 ?0241  ?WBC 7.3   < > 4.7 4.4 4.9  --  9.1 17.0* 16.7*  ?NEUTROABS 6.6  --  4.3  --   --   --   --   --   --   ?HGB 9.2*   < > 8.6* 6.4* 6.9* 10.0* 8.9* 10.5* 11.3*  ?HCT 28.5*   < > 27.3* 20.6* 22.4* 32.2* 28.2* 32.3* 33.8*  ?MCV 90.5   < > 91.6 95.4 94.9  --  93.7 90.5 88.3  ?PLT 150   < > 145* 121* 133*  --  154 195 181  ? < > = values in this interval not displayed.  ? ?Basic Metabolic Panel: ?Recent Labs  ?Lab 03/15/22 ?0144 03/15/22 ?[id number] 03/16/22 ?[id number] 03/17/22 ?0202 03/18/22 ?[id number] 03/19/22 ?0241  ?NA 141 143 142 144 143 142  ?K 3.7 3.8 4.0 4.6 4.6 4.8  ?CL 110  --  111 114* 107 105  ?CO2 21*  --  21* 23 24 26   ?GLUCOSE 197*  --  195* 202* 200* 186*  ?BUN 61*  --  73* 85* 88* 70*  ?CREATININE 2.06*  --  1.80* 1.70* 1.64* 1.31*  ?CALCIUM 9.9  --  9.0 9.3 9.2 9.6  ?MG 2.8*  --  2.1 2.3 1.9 1.9  ?PHOS 5.9*  --  4.1 3.7 2.5 3.0  ? ?GFR: ?Estimated Creatinine Clearance: 33.6 mL/min (A) (by C-G formula based on SCr of 1.31 mg/dL (H)). ?Liver Function Tests: ?Recent Labs  ?Lab 03/13/22 ?0915  ?AST 57*  ?ALT 75*  ?ALKPHOS 66  ?BILITOT 0.9  ?PROT 6.1*  ?ALBUMIN 2.1*  ? ?No results for input(s): LIPASE, AMYLASE in the last 168 hours. ?No results for input(s): AMMONIA in the last 168 hours. ?Coagulation Profile: ?Recent Labs  ?Lab 03/13/22 ?0915  ?INR 1.7*  ? ?Cardiac Enzymes: ?Recent Labs  ?Lab 03/15/22 ?0932  ?CKTOTAL 99  ? ?BNP (last 3 results) ?No results for  input(s): PROBNP in the last 8760 hours. ?HbA1C: ?No results for input(s): HGBA1C in the last 72 hours. ?CBG: ?Recent Labs  ?Lab 03/18/22 ?1952 03/19/22 ?0005 03/19/22 ?[id number] 03/19/22 ?[id number] 03/19/22 ?1210  ?GLUCAP 188* 164* 175* 211* 164*  ? ?Lipid Profile: ?Recent Labs  ?  03/17/22 ?0202 03/18/22 ?0226  ?TRIG 133 158*  ? ?Thyroid Function Tests: ?No results for input(s): TSH, T4TOTAL, FREET4, T3FREE, THYROIDAB in the last 72 hours. ?Anemia Panel: ?No results for input(s): VITAMINB12, FOLATE, FERRITIN, TIBC, IRON, RETICCTPCT in the last 72 hours. ?Sepsis Labs: ?No results for input(s): PROCALCITON, LATICACIDVEN in the last 168 hours. ? ?Recent Results (from the past 240 hour(s))  ?Group A Strep by  PCR     Status: None  ? Collection Time: 03/13/22  5:20 PM  ? Specimen: Throat; Sterile Swab  ?Result Value Ref Range Status  ? Group A Strep by PCR NOT DETECTED NOT DETECTED Final  ?  Comment: Performed at [facility], [place] [address] [place], [place] [address]  ?Respiratory (~20 pathogens) panel by PCR     Status: None  ? Collection Time: 03/13/22  7:06 PM  ? Specimen: Nasopharyngeal Swab; Respiratory  ?Result Value Ref Range Status  ? Adenovirus NOT DETECTED NOT DETECTED Final  ? Coronavirus 229E NOT DETECTED NOT DETECTED Final  ?  Comment: (NOTE) ?The Coronavirus on the Respiratory Panel, DOES NOT test for the novel  ?Coronavirus (2019 nCoV) ?  ? Coronavirus HKU1 NOT DETECTED NOT DETECTED Final  ? Coronavirus NL63 NOT DETECTED NOT DETECTED Final  ? Coronavirus OC43 NOT DETECTED NOT DETECTED Final  ? Metapneumovirus NOT DETECTED NOT DETECTED Final  ? Rhinovirus / Enterovirus NOT DETECTED NOT DETECTED Final  ? Influenza A NOT DETECTED NOT DETECTED Final  ? Influenza B NOT DETECTED NOT DETECTED Final  ? Parainfluenza Virus 1 NOT DETECTED NOT DETECTED Final  ? Parainfluenza Virus 2 NOT DETECTED NOT DETECTED Final  ? Parainfluenza Virus 3 NOT DETECTED NOT DETECTED Final  ? Parainfluenza Virus 4 NOT DETECTED NOT  DETECTED Final  ? Respiratory Syncytial Virus NOT DETECTED NOT DETECTED Final  ? Bordetella pertussis NOT DETECTED NOT DETECTED Final  ? Bordetella Parapertussis NOT DETECTED NOT DETECTED Final  ? Chlamydophila pneumoniae

## 2022-03-20 ENCOUNTER — Inpatient Hospital Stay (HOSPITAL_COMMUNITY): Payer: Medicare PPO

## 2022-03-20 DIAGNOSIS — H5789 Other specified disorders of eye and adnexa: Secondary | ICD-10-CM | POA: Diagnosis not present

## 2022-03-20 LAB — CBC
HCT: 37.4 % (ref 36.0–46.0)
Hemoglobin: 12 g/dL (ref 12.0–15.0)
MCH: 28.8 pg (ref 26.0–34.0)
MCHC: 32.1 g/dL (ref 30.0–36.0)
MCV: 89.7 fL (ref 80.0–100.0)
Platelets: 202 10*3/uL (ref 150–400)
RBC: 4.17 MIL/uL (ref 3.87–5.11)
RDW: 16.6 % — ABNORMAL HIGH (ref 11.5–15.5)
WBC: 17.8 10*3/uL — ABNORMAL HIGH (ref 4.0–10.5)
nRBC: 0.2 % (ref 0.0–0.2)

## 2022-03-20 LAB — EXTRACTABLE NUCLEAR ANTIGEN ANTIBODY
ENA SM Ab Ser-aCnc: <0.2 AI (ref 0.0–0.9)
Ribonucleic Protein: <0.2 AI (ref 0.0–0.9)
SSA (Ro) (ENA) Antibody, IgG: <0.2 AI (ref 0.0–0.9)
SSB (La) (ENA) Antibody, IgG: <0.2 AI (ref 0.0–0.9)
Scleroderma (Scl-70) (ENA) Antibody, IgG: <0.2 AI (ref 0.0–0.9)
ds DNA Ab: <1 IU/mL (ref 0–9)

## 2022-03-20 LAB — GLUCOSE, CAPILLARY
Glucose-Capillary: 118 mg/dL — ABNORMAL HIGH (ref 70–99)
Glucose-Capillary: 123 mg/dL — ABNORMAL HIGH (ref 70–99)
Glucose-Capillary: 137 mg/dL — ABNORMAL HIGH (ref 70–99)
Glucose-Capillary: 158 mg/dL — ABNORMAL HIGH (ref 70–99)
Glucose-Capillary: 160 mg/dL — ABNORMAL HIGH (ref 70–99)
Glucose-Capillary: 162 mg/dL — ABNORMAL HIGH (ref 70–99)
Glucose-Capillary: 176 mg/dL — ABNORMAL HIGH (ref 70–99)

## 2022-03-20 LAB — BASIC METABOLIC PANEL
Anion gap: 12 (ref 5–15)
BUN: 60 mg/dL — ABNORMAL HIGH (ref 8–23)
CO2: 24 mmol/L (ref 22–32)
Calcium: 9.7 mg/dL (ref 8.9–10.3)
Chloride: 103 mmol/L (ref 98–111)
Creatinine, Ser: 1.17 mg/dL — ABNORMAL HIGH (ref 0.44–1.00)
GFR, Estimated: 47 mL/min — ABNORMAL LOW (ref >60)
Glucose, Bld: 182 mg/dL — ABNORMAL HIGH (ref 70–99)
Potassium: 4.8 mmol/L (ref 3.5–5.1)
Sodium: 139 mmol/L (ref 135–145)

## 2022-03-20 LAB — CULTURE, RESPIRATORY W GRAM STAIN: Gram Stain: NONE SEEN

## 2022-03-20 LAB — MAGNESIUM: Magnesium: 1.8 mg/dL (ref 1.7–2.4)

## 2022-03-20 LAB — ANTI-JO 1 ANTIBODY, IGG: Anti JO-1: <0.2 AI (ref 0.0–0.9)

## 2022-03-20 LAB — PHOSPHORUS: Phosphorus: 3.9 mg/dL (ref 2.5–4.6)

## 2022-03-20 MED ORDER — APIXABAN 2.5 MG PO TABS
2.5000 mg | ORAL_TABLET | Freq: Two times a day (BID) | ORAL | Status: DC
  Administered 2022-03-20 – 2022-03-24 (×8): 2.5 mg
  Filled 2022-03-20 (×8): qty 1

## 2022-03-20 MED ORDER — ORAL CARE MOUTH RINSE
15.0000 mL | Freq: Two times a day (BID) | OROMUCOSAL | Status: DC
  Administered 2022-03-20 – 2022-03-30 (×19): 15 mL via OROMUCOSAL

## 2022-03-20 MED ORDER — APIXABAN 2.5 MG PO TABS
2.5000 mg | ORAL_TABLET | Freq: Two times a day (BID) | ORAL | Status: DC
  Administered 2022-03-20: 2.5 mg via ORAL
  Filled 2022-03-20: qty 1

## 2022-03-20 NOTE — TOC Progression Note (Signed)
Transition of Care (TOC) - Progression Note  ? ? ?Patient Details  ?Name: Lauren Henry ?MRN: [id number] ?Date of Birth: 09/27/1941 ? ?Transition of Care (TOC) CM/SW Contact  ?Nathanuel Cabreja Aris Lot, LCSW ?Phone Number: ?03/20/2022, 1:28 PM ? ?Clinical Narrative:    ? ?CSW called pt spouse to discuss SNF recommendation. Pt lives at home with spouse in [place]. Their Son lives next door. Spouse is agreeable to SNF workup. CSW explained SNF search and insurance auth process. CSW explained pt currently with cortrak and would not be able to go to SNF until coretrak is removed. CSW provided spouse instructions on how to use medicare website to identify SNF list and medicare star ratings. Spouse does not currently have any preference though plans to review website and provided CSW with preferences. TOC will continue to follow and fax bed requests when closer to [place].  ? ? ?Expected Discharge Plan: Skilled Nursing Facility ?Barriers to Discharge: English as a second language teacher, Continued Medical Work up, SNF Pending bed offer ? ?Expected Discharge Plan and Services ?Expected Discharge Plan: Skilled Nursing Facility ?In-house Referral: Clinical Social Work ?  ?  ?Living arrangements for the past 2 months: Single Family Home ?                ?  ?  ?  ?  ?  ?  ?  ?  ?  ?  ? ? ?Social Determinants of Health (SDOH) Interventions ?  ? ?Readmission Risk Interventions ?   ? View : No data to display.  ?  ?  ?  ? ? ?

## 2022-03-20 NOTE — Progress Notes (Signed)
Occupational Therapy Treatment ?Patient Details ?Name: Lauren Henry ?MRN: [id number] ?DOB: May 02, 1941 ?Today's Date: 03/20/2022 ? ? ?History of present illness 81 y.o female  presents to the ED via ambulance 3/23 with a chief complaint of right arm swelling and left eye swollen x a few days.   Allergic reaction?  PMH:   complete heart block, HTN, Afib on eliquis ?  ?OT comments ? Patient more alert and eager to participate with OT treatment. Patient was able to get BLEs off side of bed and required assistance with trunk and scooting forward.  Patient performed light grooming and reaching from EOB with min assist for sitting balance initially and progressed to min guard/supervision. Patient was able to stand from EOB with mod to max assist and tolerated 1 minute for each stand x3 with RW.  Patient making good progress with OT treatment. Patient's husband asked about exercises he could lead her in and was instructed in AROM exercises o perform with her.  Acute OT to continue to follow.   ? ?Recommendations for follow up therapy are one component of a multi-disciplinary discharge planning process, led by the attending physician.  Recommendations may be updated based on patient status, additional functional criteria and insurance authorization. ?   ?Follow Up Recommendations ? Acute inpatient rehab (3hours/day)  ?  ?Assistance Recommended at Discharge Frequent or constant Supervision/Assistance  ?Patient can return home with the following ? Two people to help with walking and/or transfers;Two people to help with bathing/dressing/bathroom ?  ?Equipment Recommendations ? Hospital bed  ?  ?Recommendations for Other Services   ? ?  ?Precautions / Restrictions Precautions ?Precautions: Fall;Other (comment) ?Precaution Comments: cortrak, rectal tube, watch HR ?Restrictions ?Weight Bearing Restrictions: No  ? ? ?  ? ?Mobility Bed Mobility ?Overal bed mobility: Needs Assistance ?Bed Mobility: Supine to Sit, Sit to Supine ?  ?   ?Supine to sit: Mod assist ?Sit to supine: Mod assist, +2 for physical assistance ?  ?General bed mobility comments: patient required assistance with trunk and scooting to EOB. Patient able to get LEs off side of bed ?  ? ?Transfers ?Overall transfer level: Needs assistance ?Equipment used: Rolling walker (2 wheels) ?Transfers: Sit to/from Stand ?Sit to Stand: Max assist, Mod assist ?  ?  ?  ?  ?  ?General transfer comment: stood x3 from EOB with RW tolerating 1 minute for each stand.  Patient unable to perform side stepping toward [facility] ?  ?  ?Balance Overall balance assessment: Needs assistance ?Sitting-balance support: Bilateral upper extremity supported, Feet unsupported ?Sitting balance-Leahy Scale: Poor ?Sitting balance - Comments: min assist once on EOB and progressed to min guard to supervision ?  ?Standing balance support: Reliant on assistive device for balance, Bilateral upper extremity supported, During functional activity ?Standing balance-Leahy Scale: Poor ?Standing balance comment: reliant on UE support for balance with RW ?  ?  ?  ?  ?  ?  ?  ?  ?  ?  ?  ?   ? ?ADL either performed or assessed with clinical judgement  ? ?ADL Overall ADL's : Needs assistance/impaired ?  ?  ?Grooming: Wash/dry hands;Wash/dry face;Minimal assistance;Sitting ?Grooming Details (indicate cue type and reason): performed seated on EOB ?  ?  ?  ?  ?  ?  ?  ?  ?  ?  ?  ?  ?  ?  ?  ?General ADL Comments: able to participate with grooming ?  ? ?Extremity/Trunk Assessment   ?  ?  ?  ?  ?  ? ?  Vision   ?  ?  ?Perception   ?  ?Praxis   ?  ? ?Cognition Arousal/Alertness: Awake/alert ?Behavior During Therapy: Flat affect ?Overall Cognitive Status: Difficult to assess ?Area of Impairment: Attention, Following commands, Safety/judgement, Awareness, Problem solving ?  ?  ?  ?  ?  ?  ?  ?  ?  ?Current Attention Level: Sustained ?  ?Following Commands: Follows one step commands with increased time, Follows one step commands  consistently ?Safety/Judgement: Decreased awareness of deficits ?Awareness: Intellectual ?Problem Solving: Slow processing, Decreased initiation, Difficulty sequencing, Requires verbal cues, Requires tactile cues ?General Comments: alert and eager to participate in therapy ?  ?  ?   ?Exercises Exercises: General Upper Extremity ?General Exercises - Upper Extremity ?Shoulder Flexion: AROM, Both, 10 reps, Seated ?Shoulder Extension: AROM, Both, 10 reps, Seated ?Shoulder ABduction: AROM, Both, 10 reps, Seated ?Shoulder ADduction: AROM, Both, 10 reps, Seated ? ?  ?Shoulder Instructions   ? ? ?  ?General Comments    ? ? ?Pertinent Vitals/ Pain       Pain Assessment ?Pain Assessment: Faces ?Faces Pain Scale: No hurt ?Pain Intervention(s): Monitored during session ? ?Home Living   ?  ?  ?  ?  ?  ?  ?  ?  ?  ?  ?  ?  ?  ?  ?  ?  ?  ?  ? ?  ?Prior Functioning/Environment    ?  ?  ?  ?   ? ?Frequency ? Min 2X/week  ? ? ? ? ?  ?Progress Toward Goals ? ?OT Goals(current goals can now be found in the care plan section) ? Progress towards OT goals: Progressing toward goals ? ?Acute Rehab OT Goals ?Patient Stated Goal: get better ?OT Goal Formulation: With patient/family ?Time For Goal Achievement: 04/01/22 ?Potential to Achieve Goals: Good ?ADL Goals ?Pt Will Perform Grooming: sitting;with min guard assist ?Pt Will Perform Lower Body Bathing: with supervision;sitting/lateral leans;sit to/from stand ?Pt Will Perform Upper Body Dressing: with min assist;sitting ?Pt Will Perform Lower Body Dressing: with mod assist;sit to/from stand;sitting/lateral leans ?Pt Will Transfer to Toilet: with mod assist;stand pivot transfer;bedside commode ?Pt Will Perform Toileting - Clothing Manipulation and hygiene: with modified independence;sitting/lateral leans;sit to/from stand  ?Plan Discharge plan remains appropriate   ? ?Co-evaluation ? ? ?   ?  ?  ?  ?  ? ?  ?AM-PAC OT "6 Clicks" Daily Activity     ?Outcome Measure ? ? Help from another person  eating meals?: Total ?Help from another person taking care of personal grooming?: A Lot ?Help from another person toileting, which includes using toliet, bedpan, or urinal?: Total ?Help from another person bathing (including washing, rinsing, drying)?: A Lot ?Help from another person to put on and taking off regular upper body clothing?: A Lot ?Help from another person to put on and taking off regular lower body clothing?: Total ?6 Click Score: 9 ? ?  ?End of Session Equipment Utilized During Treatment: Rolling walker (2 wheels) ? ?OT Visit Diagnosis: Muscle weakness (generalized) (M62.81);Unsteadiness on feet (R26.81);Other abnormalities of gait and mobility (R26.89) ?  ?Activity Tolerance Patient tolerated treatment well ?  ?Patient Left in bed;with call bell/phone within reach;with bed alarm set;with family/visitor present ?  ?Nurse Communication Mobility status ?  ? ?   ? ?Time: 1334-1401 ?OT Time Calculation (min): 27 min ? ?Charges: OT General Charges ?$OT Visit: 1 Visit ?OT Treatments ?$Therapeutic Activity: 23-37 mins ? ?Lodema Hong, OTA ?Acute Rehabilitation Services  ?  Pager [phone] ?Office [phone] ? ? ?Trixie Dredge ?03/20/2022, 2:56 PM ?

## 2022-03-20 NOTE — Progress Notes (Signed)
?PROGRESS NOTE ? ? ? ?Lauren Henry  ZOX:[id number] DOB: 12-19-1941 DOA: 03/13/2022 ?PCP: Haywood Pao, MD  ? ? ?Brief Narrative:  ?81 year old female with history of PAF, complete heart block status post pacemaker which is not compatible to MRI, nonobstructive CAD, diastolic dysfunction, hypothyroidism, chronic kidney disease stage IIIb, recent multiple hospitalization presented to the ER on 3/23 with left eye swelling for 1 day. ?3/2-3/5, admitted to the hospital with syncopal episode in the setting of UTI. ?3/17-3/19, failure to thrive symptoms.  CT scan chest abdomen pelvis negative for malignancy.  Given prednisone on discharge for appetite stimulation. ?3/23, presented back to the hospital with left eye swelling, more slurred speech, neck swelling with difficulty swallowing and intermittent coughing while eating.  Patient noted to be breaking into rashes and progressive swelling of the left eye and difficulty breathing. ?At the emergency room afebrile with stable vital signs.  Creatinine 2.05.  Mildly elevated AST and ALT.  Chest x-ray was normal.  She was given 500 cc of normal saline and admitted to the hospital. ?3/24, she was on the medical floor.  Facial swelling worsened and she started having tongue swelling along with tachypnea and difficulty breathing.  She was found with edematous tongue, started on high-dose steroids , intubated and admitted to the intensive care unit. ? ? ?Assessment & Plan: ?  ?Angioedema rash , conjunctival ecchymosis, neck swelling and edema: Unknown cause.   ?Mention clindamycin use, however patient presented to the hospital with eye swelling even before restarting clindamycin.  Responded well to high-dose steroids.  IgA levels within normal limit.  Group A Streptococcus negative.  Neck swelling improving.  CT scan with no complications. ?Taper off dexamethasone.  Continue Pepcid. ?Inflammation work-up mostly negative.  ESR and CRP trending down. ?Skin biopsy right arm  3/25, results are pending.  Rashes are already improving. ? ?Acute hypoxemic respiratory failure secondary to above: Intubated to protect airway.  Extubated 3/27 and currently mostly on room air. ?Treated with Rocephin for presumed aspiration pneumonia, continue and complete 7 days of therapy.  Respiratory cultures positive for Klebsiella. ? ?Dysphagia: Secondary to above.  Speech therapy following.  Now has core track tube.  Continue.  Modified barium swallow today. ? ?Anemia of acute blood loss: Received 1 unit of PRBC with appropriate response. ? ?AKI on CKD stage IIIb: Treated with fluids. Advance diet through the core track tube.  ? ?Proximal A-fib, complete heart block status post pacemaker: Sinus rhythm.  Rate controlled.  On metoprolol.  Therapeutic on Eliquis.  Resume today.  Not anticipating any surgical procedure. ? ?Hypothyroidism: Synthroid replacement on high doses.  Continue. ? ?Progressive weakness, generalized weakness, failure to thrive: ?Patient with progressive failure to thrive, poor appetite for at least 3 months now. ?Multiple investigations including ?CT scan of the neck, CT scan of the chest abdomen pelvis with no evidence of structural malignancy. ?Recent investigations including W11, folic acid, TSH, copper, methylmalonic acid levels were normal. ?Electrolytes including magnesium phosphorus is normal.  Lupus antibody pending.  Skin biopsy pending.  Cause unknown. ?Seen by neurology, do not think any spinal cord disease. ?Focus on nutrition, appetite stimulant.  Will discuss with palliative care team whether they can help with symptom management.  Continue aggressive PT OT. ? ? ?DVT prophylaxis:  eliquis  ? ? ?Code Status: Full code. ?Family Communication: Multiple family friends including patient's husband at the bedside.  We will meet him again today. ?Disposition Plan: Status is: Inpatient ?Remains inpatient appropriate because: Unable to  eat. ?  ? ? ?Consultants:  ?Neurology ?Critical  care ? ?Procedures:  ?Multiple as above. ? ?Antimicrobials:  ?Currently on Rocephin day 6/7. ? ? ?Subjective: ? ?Patient seen and examined.  Her good friend is at the bedside.  Patient is somehow more awake today.  She was able to participate in conversation.  She tells me she had no issues overnight.  She can move all extremities but very weak.  Denies any difficulty breathing.  Denies any vision problems.  Swelling and conjunctival redness is improving. ? ?Objective: ?Vitals:  ? 03/20/22 0310 03/20/22 0314 03/20/22 0400 03/20/22 0837  ?BP:   114/80 (!) 136/92  ?Pulse:   86 89  ?Resp:   16 13  ?Temp: 98.3 ?F (36.8 ?C)  98.3 ?F (36.8 ?C) 98.1 ?F (36.7 ?C)  ?TempSrc: Axillary  Axillary Axillary  ?SpO2: 96%  97% 96%  ?Weight:  78 kg    ?Height:      ? ? ?Intake/Output Summary (Last 24 hours) at 03/20/2022 1019 ?Last data filed at 03/20/2022 [phone] ?Gross per 24 hour  ?Intake 3749.26 ml  ?Output 3000 ml  ?Net 749.26 ml  ? ?Filed Weights  ? 03/18/22 0352 03/19/22 0325 03/20/22 0314  ?Weight: 76.1 kg 80.1 kg 78 kg  ? ? ?Examination: ? ?General exam: Appears calm and comfortable  ?Frail and debilitated.  Some conjunctival ecchymosis present but mostly improved than before. ?Patient does have some maculopapular rashes, fading from arms and anterior trunk. ?Respiratory system: No added sounds.  Some conducted upper airway sounds. ?Cardiovascular system: S1 & S2 heard, RRR.  Pacemaker in place. ?Gastrointestinal system: Soft.  Nontender.  Bowel sound present.  Tube feed infusing. ?Central nervous system: Alert and oriented.  Frail and debilitated.  Flat affect.  Sick looking.  On room air. ?Extremities: Symmetric, generalized weakness.  4/5 in all extremities.  Reflexes are normal. ? ? ? ?Data Reviewed: I have personally reviewed following labs and imaging studies ? ?CBC: ?Recent Labs  ?Lab 03/15/22 ?0932 03/16/22 ?4193 03/16/22 ?7902 03/16/22 ?1750 03/17/22 ?0202 03/18/22 ?4097 03/19/22 ?3532 03/20/22 ?0304  ?WBC 4.7   < > 4.9   --  9.1 17.0* 16.7* 17.8*  ?NEUTROABS 4.3  --   --   --   --   --   --   --   ?HGB 8.6*   < > 6.9* 10.0* 8.9* 10.5* 11.3* 12.0  ?HCT 27.3*   < > 22.4* 32.2* 28.2* 32.3* 33.8* 37.4  ?MCV 91.6   < > 94.9  --  93.7 90.5 88.3 89.7  ?PLT 145*   < > 133*  --  154 195 181 202  ? < > = values in this interval not displayed.  ? ?Basic Metabolic Panel: ?Recent Labs  ?Lab 03/16/22 ?0226 03/17/22 ?0202 03/18/22 ?9924 03/19/22 ?2683 03/20/22 ?0304  ?NA 142 144 143 142 139  ?K 4.0 4.6 4.6 4.8 4.8  ?CL 111 114* 107 105 103  ?CO2 21* 23 24 26 24   ?GLUCOSE 195* 202* 200* 186* 182*  ?BUN 73* 85* 88* 70* 60*  ?CREATININE 1.80* 1.70* 1.64* 1.31* 1.17*  ?CALCIUM 9.0 9.3 9.2 9.6 9.7  ?MG 2.1 2.3 1.9 1.9 1.8  ?PHOS 4.1 3.7 2.5 3.0 3.9  ? ?GFR: ?Estimated Creatinine Clearance: 37.1 mL/min (A) (by C-G formula based on SCr of 1.17 mg/dL (H)). ?Liver Function Tests: ?No results for input(s): AST, ALT, ALKPHOS, BILITOT, PROT, ALBUMIN in the last 168 hours. ? ?No results for input(s): LIPASE, AMYLASE in the last  168 hours. ?No results for input(s): AMMONIA in the last 168 hours. ?Coagulation Profile: ?No results for input(s): INR, PROTIME in the last 168 hours. ? ?Cardiac Enzymes: ?Recent Labs  ?Lab 03/15/22 ?0932  ?CKTOTAL 99  ? ?BNP (last 3 results) ?No results for input(s): PROBNP in the last 8760 hours. ?HbA1C: ?No results for input(s): HGBA1C in the last 72 hours. ?CBG: ?Recent Labs  ?Lab 03/19/22 ?1620 03/19/22 ?2056 03/20/22 ?0013 03/20/22 ?0761 03/20/22 ?0839  ?GLUCAP 178* 151* 160* 158* 162*  ? ?Lipid Profile: ?Recent Labs  ?  03/18/22 ?0226  ?TRIG 158*  ? ?Thyroid Function Tests: ?No results for input(s): TSH, T4TOTAL, FREET4, T3FREE, THYROIDAB in the last 72 hours. ?Anemia Panel: ?No results for input(s): VITAMINB12, FOLATE, FERRITIN, TIBC, IRON, RETICCTPCT in the last 72 hours. ?Sepsis Labs: ?No results for input(s): PROCALCITON, LATICACIDVEN in the last 168 hours. ? ?Recent Results (from the past 240 hour(s))  ?Group A Strep by PCR      Status: None  ? Collection Time: 03/13/22  5:20 PM  ? Specimen: Throat; Sterile Swab  ?Result Value Ref Range Status  ? Group A Strep by PCR NOT DETECTED NOT DETECTED Final  ?  Comment: Performed at

## 2022-03-20 NOTE — Progress Notes (Signed)
Modified Barium Swallow Progress Note ? ?Patient Details  ?Name: KATILYN MILTENBERGER ?MRN: [id number] ?Date of Birth: 1941-05-25 ? ?Today's Date: 03/20/2022 ? ?Modified Barium Swallow completed.  Full report located under Chart Review in the Imaging Section. ? ?Brief recommendations include the following: ? ?Clinical Impression ? Patient presents with a severe oropharyngeal dysphagia as per this MBS. Cortrak tube was left in during exam and did appear to impact bolus transit through upper esophagus. During oral phase, patient exhibited decreased oral motor strength leading to decreased anterior to posterior transit of all consistencies, as well as oral residuals (more so with puree solids). During pharyngeal phase of swallow, patient with limited hyolaryngeal elevation and decreased epiglottic deflection leading to moderate amount of vallecular residuals with puree solids and mild amount residuals on posterior pharyngeal wall and pyriform sinus. Patient able to clear small amounts of pharyngeal residuals with each cued swallow but she remains lethargic, easily fatigued and weak. One instance of sensed aspiration occured during the swallow with thin liquid sip taken after puree bite and aspiration occuring as a result of vallecular sinus already full of puree bolus and pyriform sinus with some puree residual as well. Aspiration was trace in amount. SLP is recommending to continue NPO and plan to reassess swallow via MBS when patient able to be adequately alert and vocalizing/verbalizing. Recommend test to be completed on day when Cortrak team is available (MWF) as Cortrak will need to be pulled prior to MBS secondary to likely impact on swallow function. ?  ?Swallow Evaluation Recommendations ? ?   ? ? SLP Diet Recommendations: NPO;Ice chips PRN after oral care ? ?   ? ? Oral Care Recommendations: Oral care QID;Staff/trained caregiver to provide oral care ? ?   ? ? ?Angela Nevin, MA, CCC-SLP ?Speech Therapy ? ?

## 2022-03-21 DIAGNOSIS — Z515 Encounter for palliative care: Secondary | ICD-10-CM

## 2022-03-21 DIAGNOSIS — Z66 Do not resuscitate: Secondary | ICD-10-CM

## 2022-03-21 DIAGNOSIS — H5789 Other specified disorders of eye and adnexa: Secondary | ICD-10-CM | POA: Diagnosis not present

## 2022-03-21 DIAGNOSIS — Z7189 Other specified counseling: Secondary | ICD-10-CM | POA: Diagnosis not present

## 2022-03-21 LAB — GLUCOSE, CAPILLARY
Glucose-Capillary: 105 mg/dL — ABNORMAL HIGH (ref 70–99)
Glucose-Capillary: 111 mg/dL — ABNORMAL HIGH (ref 70–99)
Glucose-Capillary: 121 mg/dL — ABNORMAL HIGH (ref 70–99)
Glucose-Capillary: 146 mg/dL — ABNORMAL HIGH (ref 70–99)
Glucose-Capillary: 147 mg/dL — ABNORMAL HIGH (ref 70–99)
Glucose-Capillary: 154 mg/dL — ABNORMAL HIGH (ref 70–99)

## 2022-03-21 MED ORDER — ACETAMINOPHEN 650 MG RE SUPP: 650.0000 mg | Freq: Four times a day (QID) | RECTAL | Status: DC | PRN

## 2022-03-21 MED ORDER — ACETAMINOPHEN 160 MG/5ML PO SOLN
650.0000 mg | Freq: Four times a day (QID) | ORAL | Status: DC | PRN
  Administered 2022-03-21 – 2022-03-23 (×3): 650 mg
  Filled 2022-03-21 (×3): qty 20.3

## 2022-03-21 MED ORDER — LIP MEDEX EX OINT
TOPICAL_OINTMENT | CUTANEOUS | Status: DC | PRN
  Administered 2022-03-21: 1 via TOPICAL
  Filled 2022-03-21: qty 7

## 2022-03-21 MED ORDER — DRONABINOL 2.5 MG PO CAPS
2.5000 mg | ORAL_CAPSULE | Freq: Two times a day (BID) | ORAL | Status: DC
  Filled 2022-03-21 (×3): qty 1

## 2022-03-21 NOTE — Progress Notes (Addendum)
? ?  Palliative Medicine Inpatient Follow Up Note ? ?Family meeting held with patients son, daughter, & spouse at bedside this early evening. Kailly is far more alert and able to participate though she is hypophonic.  ? ?We reviewed that Kimani has had poor PO's since November 2022. We reviewed that she has functionally been less able since the turn of the new year.  ? ?Patient participates in the conversation. We discussed the concerns that she has been declining and has endured three hospitalizations this month alone causing a decrease of mental, emotional, physical strength. I asked Charmion what her wishes are moving forward and if she wants [place] to continue along with aggressive care and recurrent re-hospitalizations or if she wants [place] to change our focus to more of a symptom comfort emphasis. As of presently, Raiya wants to improve her present condition and if willing to put in the effort to rehabilitate. We reviewed that anything can certainly change at any time and that if it does we can have another conversation regarding this. ? ?In terms of Jadea's poor appetite. She is in favor of a lose dose appetite stimulant. She is presently on mirtazapine. IN addition we can try some low dose Marinol. She is eager to eat and looks forward to working with speech therapy in the oncoming days.  ? ?From the perspective of code status. We reviewed that Zeynab does not want life prolonging measures to sustain life. If she suffered a cardiac arrest she would wish to allow a natural passing. I confirmed that this meant DNAR. If there were a situation which was reversible such as an infection she would wish for treatment.  ? ?Patient is hopeful to continue working with physical therapy. She was able to get OOB with me to the recliner chair with 2P assistance.  ? ?SUMMARY OF RECOMMENDATIONS   ?DNAR ? ?Continue to treat what is treatable ? ?Patient and family optimistic for improvements with swallowing and  functionality ? ?Appreciate Speech therapy and Physical Therapy continuing to work with [facility] ? ?Open to short term SNF placement for strength ? ?Will add Marinol 2.5mg  PO BID ? ?Ongoing PMT Support ? ?Will recommend OP Palliative Support on discharge ? ?Additional Time Spent: 60 ? ?MDM - High ?______________________________________________________________________________________ ?Lamarr Lulas ?[facility] Team ?Team Cell Phone: [phone] ?Please utilize secure chat with additional questions, if there is no response within 30 minutes please call the above phone number ? ?Palliative Medicine Team providers are available by phone from 7am to 7pm daily and can be reached through the team cell phone.  ?Should this patient require assistance outside of these hours, please call the patient's attending physician. ? ? ? ? ?

## 2022-03-21 NOTE — Progress Notes (Signed)
Physical Therapy Treatment ?Patient Details ?Name: Lauren Henry ?MRN: [id number] ?DOB: 05-Oct-1941 ?Today's Date: 03/21/2022 ? ? ?History of Present Illness 81 y.o female  presents to the ED via ambulance 3/23 with a chief complaint of right arm swelling and left eye swollen x a few days.   Allergic reaction?  PMH:   complete heart block, HTN, Afib on eliquis ? ?  ?PT Comments  ? ? Pt in bed on arrival, lethargic. Performed BLE exercises in supine, following commands consistently. Bed placed in chair position. Pt able to pull forward to sit 3 trials, 10 sec/trial, with max/total assist. Then attempted to get pt to participate in reaching activities, but pt very fatigued. Falling asleep and unable to get her to follow further commands. HOB left at 50 degrees at end of session. ?   ?Recommendations for follow up therapy are one component of a multi-disciplinary discharge planning process, led by the attending physician.  Recommendations may be updated based on patient status, additional functional criteria and insurance authorization. ? ?Follow Up Recommendations ? Skilled nursing-short term rehab (<3 hours/day) ?  ?  ?Assistance Recommended at Discharge Frequent or constant Supervision/Assistance  ?Patient can return home with the following Assistance with cooking/housework;Assist for transportation;Help with stairs or ramp for entrance;Two people to help with walking and/or transfers;Two people to help with bathing/dressing/bathroom;Direct supervision/assist for medications management;Direct supervision/assist for financial management ?  ?Equipment Recommendations ? Wheelchair (measurements PT);Wheelchair cushion (measurements PT);Hospital bed  ?  ?Recommendations for Other Services   ? ? ?  ?Precautions / Restrictions Precautions ?Precautions: Fall;Other (comment) ?Precaution Comments: cortrak, rectal tube, watch HR ?Restrictions ?Weight Bearing Restrictions: No  ?  ? ?Mobility ? Bed Mobility ?  ?  ?  ?  ?  ?  ?   ?General bed mobility comments: Bed placed in chair position. Able to pull trunk forward using BUE on lower bedrails, max/total assist. Sustained position with trunk off bed x 3 trials, 10 sec/trial. ?  ? ?Transfers ?  ?  ?  ?  ?  ?  ?  ?  ?  ?  ?  ? ?Ambulation/Gait ?  ?  ?  ?  ?  ?  ?  ?  ? ? ?Stairs ?  ?  ?  ?  ?  ? ? ?Wheelchair Mobility ?  ? ?Modified Rankin (Stroke Patients Only) ?  ? ? ?  ?Balance   ?  ?  ?  ?  ?  ?  ?  ?  ?  ?  ?  ?  ?  ?  ?  ?  ?  ?  ?  ? ?  ?Cognition Arousal/Alertness: Lethargic ?Behavior During Therapy: Flat affect ?Overall Cognitive Status: Difficult to assess ?  ?  ?  ?  ?  ?  ?  ?  ?  ?  ?  ?  ?  ?  ?  ?  ?General Comments: Falling asleep frequently. Following simple commands consistently when awake. ?  ?  ? ?  ?Exercises General Exercises - Lower Extremity ?Ankle Circles/Pumps: AROM, Both, 10 reps, Supine ?Long Arc Quad: AROM, Left, Right, 10 reps, Supine ?Hip ABduction/ADduction: AROM, Right, Left, 10 reps, Supine ? ?  ?General Comments   ?  ?  ? ?Pertinent Vitals/Pain Pain Assessment ?Pain Assessment: Faces ?Faces Pain Scale: Hurts a little bit ?Pain Location: abdomen ?Pain Descriptors / Indicators: Grimacing ?Pain Intervention(s): Monitored during session  ? ? ?Home Living   ?  ?  ?  ?  ?  ?  ?  ?  ?  ?   ?  ?  Prior Function    ?  ?  ?   ? ?PT Goals (current goals can now be found in the care plan section) Acute Rehab PT Goals ?Patient Stated Goal: not stated ?Progress towards PT goals: Not progressing toward goals - comment (lethargic, poor activity tolerance) ? ?  ?Frequency ? ? ? Min 3X/week ? ? ? ?  ?PT Plan Current plan remains appropriate  ? ? ?Co-evaluation   ?  ?  ?  ?  ? ?  ?AM-PAC PT "6 Clicks" Mobility   ?Outcome Measure ? Help needed turning from your back to your side while in a flat bed without using bedrails?: A Lot ?Help needed moving from lying on your back to sitting on the side of a flat bed without using bedrails?: Total ?Help needed moving to and from a bed to  a chair (including a wheelchair)?: Total ?Help needed standing up from a chair using your arms (e.g., wheelchair or bedside chair)?: Total ?Help needed to walk in hospital room?: Total ?Help needed climbing 3-5 steps with a railing? : Total ?6 Click Score: 7 ? ?  ?End of Session   ?Activity Tolerance: Patient limited by lethargy;Patient limited by fatigue ?Patient left: in bed;with call bell/phone within reach;with family/visitor present ?Nurse Communication: Mobility status ?PT Visit Diagnosis: Muscle weakness (generalized) (M62.81);Other abnormalities of gait and mobility (R26.89) ?  ? ? ?Time: 1610-9604 ?PT Time Calculation (min) (ACUTE ONLY): 17 min ? ?Charges:  $Therapeutic Exercise: 8-22 mins          ?          ? ?Lauren Henry, PT  ?Office # [phone] ?Pager [phone] ? ? ? ?Lauren Henry ?03/21/2022, 12:20 PM ? ?

## 2022-03-21 NOTE — Consult Note (Signed)
?Palliative Medicine Inpatient Consult Note ? ?Consulting Provider: Barb Merino, MD ? ?Reason for consult:   ?Palliative Care Consult Services Symptom Management Consult  ?Reason for Consult? failure to thrive, unexplained poor appetite  ? ? ?HPI:  ?Per intake H&P --> 81 year old female with history of PAF, complete heart block status post pacemaker which is not compatible to MRI, nonobstructive CAD, diastolic dysfunction, hypothyroidism, chronic kidney disease stage IIIb, recent multiple hospitalization presented to the ER on 3/23 with left eye swelling for 1 day. ?3/2-3/5, admitted to the hospital with syncopal episode in the setting of UTI. ?3/17-3/19, failure to thrive symptoms.  CT scan chest abdomen pelvis negative for malignancy.  Given prednisone on discharge for appetite stimulation. ?3/23, presented back to the hospital with left eye swelling, more slurred speech, neck swelling with difficulty swallowing and intermittent coughing while eating.  Patient noted to be breaking into rashes and progressive swelling of the left eye and difficulty breathing. ?At the emergency room afebrile with stable vital signs.  Creatinine 2.05.  Mildly elevated AST and ALT.  Chest x-ray was normal.  She was given 500 cc of normal saline and admitted to the hospital. ?3/24, she was on the medical floor.  Facial swelling worsened and she started having tongue swelling along with tachypnea and difficulty breathing.  She was found with edematous tongue, started on high-dose steroids , intubated and admitted to the intensive care unit. ? ?Palliative care has been asked to get involved to further address goals of care. ? ?Clinical Assessment/Goals of Care: ? ?*Please note that this is a verbal dictation therefore any spelling or grammatical errors are due to the "[place] [employer]" system interpretation. ? ?I have reviewed medical records including EPIC notes, labs and imaging, received report from bedside RN, assessed the  patient.  ?  ?I called patients spouse, Quillian Quince to further discuss diagnosis prognosis, GOC, EOL wishes, disposition and options. ?  ?I introduced Palliative Medicine as specialized medical care for people living with serious illness. It focuses on providing relief from the symptoms and stress of a serious illness. The goal is to improve quality of life for both the patient and the family. ? ?Medical History Review and Understanding: ? ?A review of patient's atrial fibrillation, complete heart block, coronary artery disease, and multiple admissions within the month of March were reviewed. ? ?Social History: ? ?Erich lives in [place], [place].  She has been married to her spouse, Quillian Quince for the past 80 years.  They share 2 children and 5 grandchildren.  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  She is extremely intelligent and is able to cook any and all things.  She is a woman of faith and practices within the [employer] [employer] [employer] denomination. ? ?Functional and Nutritional State: ? ?Jillienne was very well functioning about 3 months ago though per her spouse over the past month has declined tremendously.  She is needed more help and at this point in time is dependent on others for basic activities of daily living. ? ?Patient had had ongoing failure to thrive for a prolonged period of time per Quillian Quince though he had not recognized it and/or just "let it go".  We discussed the significance of failure to thrive in a geriatric patient and how this is often one of the first signs that patients are transitioning in life. ? ?Advance Directives: ? ?A detailed discussion was had today regarding advanced directives. Olie does  have a living will per her husband and he will bring it in for further conversation. ? ?Code Status: ? ?Encouraged patient/family to consider DNR/DNI status understanding evidenced based poor outcomes in similar hospitalized  patient, as the cause of arrest is likely associated with advanced chronic/terminal illness rather than an easily reversible acute cardio-pulmonary event. I explained that DNR/DNI does not change the medical plan and it only comes into effect after a person has arrested (died).  It is a protective measure to keep [place] from harming the patient in their last moments of life. ? ?At this time Quillian Quince would like to discuss this with his 2 children.  He does not want to put Mylinh through additional trauma. ? ?Discussion: ? ?Corynne has had ongoing failure to thrive for the past 3 months.  Family endorses that they had known about this but not sought any type of treatment.  Reviewed that often when this occurs patient's bodies are telling us more than perhaps medical science knows. ? ?I shared in the setting of recurrence of admissions patient will likely have much more declined functional state and have a new baseline level of functioning.  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 ? ?Reviewed the importance of meeting for further conversations.  Quillian Quince shared that he would ask his family to come together and meet this afternoon at 3 PM. ? ?Discussed the importance of continued conversation with family and their  medical providers regarding overall plan of care and treatment options, ensuring decisions are within the context of the patients values and GOCs. ? ?Decision Maker: ?Anna Genre (spouse): [phone] ? ?SUMMARY OF RECOMMENDATIONS   ?Full Code --> Family will discuss this among themselves to reach a consensus. Quillian Quince does not believe that [place] would wish for life prolonging measures ? ?Plan for family meeting at 3 PM ? ?Ongoing palliative care support ? ?Code Status/Advance Care Planning: ?FULL CODE ?  ?Palliative Prophylaxis:  ?Aspiration, Bowel Regimen, Delirium Protocol, Frequent Pain Assessment, Oral Care, Palliative Wound Care, and Turn Reposition ? ?Additional  Recommendations (Limitations, Scope, Preferences): ?Treat the treatable ? ?Psycho-social/Spiritual:  ?Desire for further Chaplaincy support: Yes ?Additional Recommendations: Education on chronic disease processes.  Education on failure to thrive ?  ?Prognosis: I suspect patient's prognosis will be quite limited.  She at this point in time has multisystem failure in the setting of frequent hospitalizations, albumin of 2.1, unintentional weight loss, and being more confined to the bed. ? ?Discharge Planning: Discharge plan uncertain at this point ? ?Vitals:  ? 03/20/22 2346 03/21/22 0340  ?BP: (!) 146/84 (!) 141/82  ?Pulse: 85 89  ?Resp: 15 15  ?Temp: 98.4 ?F (36.9 ?C) 98 ?F (36.7 ?C)  ?SpO2: 94% 98%  ? ? ?Intake/Output Summary (Last 24 hours) at 03/21/2022 0656 ?Last data filed at 03/20/2022 1240 ?Gross per 24 hour  ?Intake 248.58 ml  ?Output --  ?Net 248.58 ml  ? ?Last Weight  Most recent update: 03/21/2022  5:05 AM  ? ? Weight  ?78.1 kg (172 lb 2.9 oz)  ?      ? ?  ? ?Gen: Elderly Caucasian female in no acute distress ?HEENT: Core track in place, dry, tongue appears to have laceration mucous membranes ?CV: Regular rate and irregular rhythm ?PULM: On room air ?ABD: soft/nontender ?EXT: No edema ?Neuro: Alert and oriented x2 ? ?PPS: 20% ? ? ?This conversation/these recommendations were discussed with patient primary care team, Dr. Sloan Leiter ? ?  MD[facility] ?______________________________________________________ ?Tacey Ruiz ?[place] Team ?Team Cell Phone: [phone] ?Please utilize secure chat with additional questions, if there is no response within 30 minutes please call the above phone number ? ?Palliative Medicine Team providers are available by phone from 7am to 7pm daily and can be reached through the team cell phone.  ?Should this patient require assistance outside of these hours, please call the patient's attending physician. ? ? ?

## 2022-03-21 NOTE — Progress Notes (Signed)
?PROGRESS NOTE ? ? ? ?Lauren Henry  FYB:[id number] DOB: Nov 11, 1941 DOA: 03/13/2022 ?PCP: Haywood Pao, MD  ? ? ?Brief Narrative:  ?81 year old female with history of PAF, complete heart block status post pacemaker that is not compatible to MRI, nonobstructive CAD, diastolic dysfunction, hypothyroidism, chronic kidney disease stage IIIb, recent multiple hospitalization presented to the ER on 3/23 with left eye swelling for 1 day. ? ?3/2-3/5, admitted to the hospital with syncopal episode in the setting of UTI. ?3/17-3/19, failure to thrive symptoms.  CT scan chest abdomen pelvis negative for malignancy.  Given prednisone on discharge for appetite stimulation. ?3/23, presented back to the hospital with left eye swelling, more slurred speech, neck swelling with difficulty swallowing and intermittent coughing while eating.  Patient noted to be breaking into rashes and progressive swelling of the left eye and difficulty breathing. ?At the emergency room afebrile with stable vital signs.  Creatinine 2.05.  Mildly elevated AST and ALT.  Chest x-ray was normal.  She was given 500 cc of normal saline and admitted to the hospital. ?3/24, she was on the medical floor.  Facial swelling worsened and she started having tongue swelling along with tachypnea and difficulty breathing.  She was found with edematous tongue, started on high-dose steroids , intubated and admitted to the intensive care unit. ? ? ?Assessment & Plan: ?  ?Angioedema rash , conjunctival ecchymosis, neck swelling and edema: Unknown cause.   ?Mentioned clindamycin use, however patient presented to the hospital with eye swelling even before starting clindamycin.  Responded well to high-dose steroids.  IgA levels within normal limit.  Group A Streptococcus negative.  Neck swelling improving.  CT scan with no complications. ?Completing his steroid course today.  Continue Pepcid. ?Inflammation work-up mostly negative.  ESR and CRP trending down. ?Skin biopsy  right arm 3/25, results are pending.  Rashes are already improving. ? ?Acute hypoxemic respiratory failure secondary to above: Intubated to protect airway.  Extubated 3/27 and currently mostly on room air. Treated with Rocephin for presumed aspiration pneumonia, continue and complete 7 days of therapy.  Respiratory cultures positive for Klebsiella. ? ?Dysphagia: Secondary to above.  Speech therapy following.  Now has core track tube.  Mental status and wakefulness not helping with swallow testing.  We will continue.  Mouth care.  Okay for ice chips under supervision. ? ?Anemia of acute blood loss: Received 1 unit of PRBC with appropriate response. ? ?AKI on CKD stage IIIb: Treated with fluids. Advance diet through the core track tube.  ? ?Proximal A-fib, complete heart block status post pacemaker: Sinus rhythm.  Rate controlled.  On metoprolol.  Therapeutic on Eliquis.   ? ?Hypothyroidism: Synthroid replacement on high doses.  Continue. ? ?Progressive weakness, generalized weakness, failure to thrive: ?Patient with progressive failure to thrive, poor appetite for at least 3 months now. ?Multiple investigations including ?CT scan of the neck, CT scan of the chest abdomen pelvis with no evidence of structural malignancy. ?Recent investigations including N27, folic acid, TSH, copper, methylmalonic acid levels were normal. ?Electrolytes including magnesium phosphorus is normal.  Lupus antibody pending.  Skin biopsy pending.  Cause unknown. ?Seen by neurology, do not think any spinal cord disease. ?Focus on nutrition, appetite stimulant.  Will discuss with palliative care team whether they can help with symptom management.  Continue aggressive PT OT. ? ? ?DVT prophylaxis: apixaban (ELIQUIS) tablet 2.5 mg Start: 03/20/22 2200 eliquis  ?apixaban (ELIQUIS) tablet 2.5 mg  ? ?Code Status: Full code. ?Family Communication: Husband at  the bedside 3/30. ?Disposition Plan: Status is: Inpatient ?Remains inpatient appropriate  because: Unable to eat. ?  ? ? ?Consultants:  ?Neurology ?Critical care ? ?Procedures:  ?Multiple as above. ? ?Antimicrobials:  ?Currently on Rocephin day 7/7. ? ? ?Subjective: ? ?Patient seen and examined.  Her friend Ms. Hassan Rowan is at the bedside.  Patient's daughter stayed overnight.  Less confusion and agitation overnight.  Patient keeps her eyes closed, she follows commands.  She denies any complaints.  She tells me she is doing fine and able to smiles on talking. ?Remains afebrile. ? ? ?Objective: ?Vitals:  ? 03/20/22 2346 03/21/22 0340 03/21/22 0500 03/21/22 0815  ?BP: (!) 146/84 (!) 141/82  138/73  ?Pulse: 85 89  91  ?Resp: 15 15  17   ?Temp: 98.4 ?F (36.9 ?C) 98 ?F (36.7 ?C)  (!) 97 ?F (36.1 ?C)  ?TempSrc: Axillary Oral  Axillary  ?SpO2: 94% 98%  94%  ?Weight:   78.1 kg   ?Height:      ? ? ?Intake/Output Summary (Last 24 hours) at 03/21/2022 1101 ?Last data filed at 03/20/2022 1240 ?Gross per 24 hour  ?Intake 3 ml  ?Output --  ?Net 3 ml  ? ?Filed Weights  ? 03/19/22 0325 03/20/22 0314 03/21/22 0500  ?Weight: 80.1 kg 78 kg 78.1 kg  ? ? ?Examination: ? ?General exam: Appears calm and comfortable.  Frail and debilitated.  Periorbital swelling and ecchymosis, considerable injection mostly cleared.  Rashes mostly cleared. ?Her mouth is dry and her lips and tongues are chapped. ?Respiratory system: No added sounds.  Some conducted upper airway sounds. ?Cardiovascular system: S1 & S2 heard, RRR.  Pacemaker in place. ?Gastrointestinal system: Soft.  Nontender.  Bowel sound present.  Tube feed infusing. ?Central nervous system: Alert and oriented.  Frail and debilitated.  Flat affect.  Sick looking.  On room air. ?Extremities: Symmetric, generalized weakness.  4/5 in all extremities.  Reflexes are normal. ? ? ? ?Data Reviewed: I have personally reviewed following labs and imaging studies ? ?CBC: ?Recent Labs  ?Lab 03/15/22 ?0932 03/16/22 ?7078 03/16/22 ?6754 03/16/22 ?1750 03/17/22 ?0202 03/18/22 ?4920 03/19/22 ?1007  03/20/22 ?0304  ?WBC 4.7   < > 4.9  --  9.1 17.0* 16.7* 17.8*  ?NEUTROABS 4.3  --   --   --   --   --   --   --   ?HGB 8.6*   < > 6.9* 10.0* 8.9* 10.5* 11.3* 12.0  ?HCT 27.3*   < > 22.4* 32.2* 28.2* 32.3* 33.8* 37.4  ?MCV 91.6   < > 94.9  --  93.7 90.5 88.3 89.7  ?PLT 145*   < > 133*  --  154 195 181 202  ? < > = values in this interval not displayed.  ? ?Basic Metabolic Panel: ?Recent Labs  ?Lab 03/16/22 ?0226 03/17/22 ?0202 03/18/22 ?1219 03/19/22 ?7588 03/20/22 ?0304  ?NA 142 144 143 142 139  ?K 4.0 4.6 4.6 4.8 4.8  ?CL 111 114* 107 105 103  ?CO2 21* 23 24 26 24   ?GLUCOSE 195* 202* 200* 186* 182*  ?BUN 73* 85* 88* 70* 60*  ?CREATININE 1.80* 1.70* 1.64* 1.31* 1.17*  ?CALCIUM 9.0 9.3 9.2 9.6 9.7  ?MG 2.1 2.3 1.9 1.9 1.8  ?PHOS 4.1 3.7 2.5 3.0 3.9  ? ?GFR: ?Estimated Creatinine Clearance: 37.1 mL/min (A) (by C-G formula based on SCr of 1.17 mg/dL (H)). ?Liver Function Tests: ?No results for input(s): AST, ALT, ALKPHOS, BILITOT, PROT, ALBUMIN in the last 168 hours. ? ?  No results for input(s): LIPASE, AMYLASE in the last 168 hours. ?No results for input(s): AMMONIA in the last 168 hours. ?Coagulation Profile: ?No results for input(s): INR, PROTIME in the last 168 hours. ? ?Cardiac Enzymes: ?Recent Labs  ?Lab 03/15/22 ?0932  ?CKTOTAL 99  ? ?BNP (last 3 results) ?No results for input(s): PROBNP in the last 8760 hours. ?HbA1C: ?No results for input(s): HGBA1C in the last 72 hours. ?CBG: ?Recent Labs  ?Lab 03/20/22 ?1634 03/20/22 ?1935 03/20/22 ?2347 03/21/22 ?0342 03/21/22 ?0818  ?GLUCAP 176* 137* 123* 105* 147*  ? ?Lipid Profile: ?No results for input(s): CHOL, HDL, LDLCALC, TRIG, CHOLHDL, LDLDIRECT in the last 72 hours. ? ?Thyroid Function Tests: ?No results for input(s): TSH, T4TOTAL, FREET4, T3FREE, THYROIDAB in the last 72 hours. ?Anemia Panel: ?No results for input(s): VITAMINB12, FOLATE, FERRITIN, TIBC, IRON, RETICCTPCT in the last 72 hours. ?Sepsis Labs: ?No results for input(s): PROCALCITON, LATICACIDVEN in the  last 168 hours. ? ?Recent Results (from the past 240 hour(s))  ?Group A Strep by PCR     Status: None  ? Collection Time: 03/13/22  5:20 PM  ? Specimen: Throat; Sterile Swab  ?Result Value Ref Range Status  ?

## 2022-03-22 DIAGNOSIS — H5789 Other specified disorders of eye and adnexa: Secondary | ICD-10-CM | POA: Diagnosis not present

## 2022-03-22 LAB — MAGNESIUM: Magnesium: 1.7 mg/dL (ref 1.7–2.4)

## 2022-03-22 LAB — CBC WITH DIFFERENTIAL/PLATELET
Abs Immature Granulocytes: 0.5 10*3/uL — ABNORMAL HIGH (ref 0.00–0.07)
Basophils Absolute: 0 10*3/uL (ref 0.0–0.1)
Basophils Relative: 0 %
Eosinophils Absolute: 0.6 10*3/uL — ABNORMAL HIGH (ref 0.0–0.5)
Eosinophils Relative: 4 %
HCT: 38.5 % (ref 36.0–46.0)
Hemoglobin: 11.9 g/dL — ABNORMAL LOW (ref 12.0–15.0)
Lymphocytes Relative: 3 %
Lymphs Abs: 0.5 10*3/uL — ABNORMAL LOW (ref 0.7–4.0)
MCH: 28.6 pg (ref 26.0–34.0)
MCHC: 30.9 g/dL (ref 30.0–36.0)
MCV: 92.5 fL (ref 80.0–100.0)
Metamyelocytes Relative: 2 %
Monocytes Absolute: 0.3 10*3/uL (ref 0.1–1.0)
Monocytes Relative: 2 %
Myelocytes: 1 %
Neutro Abs: 13.2 10*3/uL — ABNORMAL HIGH (ref 1.7–7.7)
Neutrophils Relative %: 88 %
Platelets: 182 10*3/uL (ref 150–400)
RBC: 4.16 MIL/uL (ref 3.87–5.11)
RDW: 16.8 % — ABNORMAL HIGH (ref 11.5–15.5)
WBC: 15 10*3/uL — ABNORMAL HIGH (ref 4.0–10.5)
nRBC: 0 % (ref 0.0–0.2)
nRBC: 0 /100 WBC

## 2022-03-22 LAB — BASIC METABOLIC PANEL
Anion gap: 9 (ref 5–15)
BUN: 61 mg/dL — ABNORMAL HIGH (ref 8–23)
CO2: 27 mmol/L (ref 22–32)
Calcium: 9.5 mg/dL (ref 8.9–10.3)
Chloride: 104 mmol/L (ref 98–111)
Creatinine, Ser: 1.2 mg/dL — ABNORMAL HIGH (ref 0.44–1.00)
GFR, Estimated: 46 mL/min — ABNORMAL LOW (ref >60)
Glucose, Bld: 114 mg/dL — ABNORMAL HIGH (ref 70–99)
Potassium: 4.4 mmol/L (ref 3.5–5.1)
Sodium: 140 mmol/L (ref 135–145)

## 2022-03-22 LAB — GLUCOSE, CAPILLARY
Glucose-Capillary: 123 mg/dL — ABNORMAL HIGH (ref 70–99)
Glucose-Capillary: 127 mg/dL — ABNORMAL HIGH (ref 70–99)
Glucose-Capillary: 129 mg/dL — ABNORMAL HIGH (ref 70–99)
Glucose-Capillary: 152 mg/dL — ABNORMAL HIGH (ref 70–99)
Glucose-Capillary: 86 mg/dL (ref 70–99)
Glucose-Capillary: 94 mg/dL (ref 70–99)

## 2022-03-22 LAB — PHOSPHORUS: Phosphorus: 4.5 mg/dL (ref 2.5–4.6)

## 2022-03-22 MED ORDER — PNEUMOCOCCAL VAC POLYVALENT 25 MCG/0.5ML IJ INJ
0.5000 mL | INJECTION | INTRAMUSCULAR | Status: AC
  Administered 2022-03-23: 0.5 mL via INTRAMUSCULAR
  Filled 2022-03-22: qty 0.5

## 2022-03-22 MED ORDER — MEGESTROL ACETATE 400 MG/10ML PO SUSP
400.0000 mg | Freq: Every day | ORAL | Status: DC
  Administered 2022-03-22 – 2022-03-25 (×4): 400 mg
  Filled 2022-03-22 (×5): qty 10

## 2022-03-22 NOTE — Plan of Care (Signed)

## 2022-03-22 NOTE — Progress Notes (Signed)
Since Marinol suspension is not available here, we will change it to Megace 400mg  per tube qday per Dr. Sloan Leiter. ? ?Onnie Boer, PharmD, BCIDP, AAHIVP, CPP ?Infectious Disease Pharmacist ?03/22/2022 6:33 PM ? ? ?

## 2022-03-22 NOTE — Progress Notes (Signed)
?PROGRESS NOTE ? ? ? ?Lauren Henry  XJO:[id number] DOB: 1941/09/11 DOA: 03/13/2022 ?PCP: Haywood Pao, MD  ? ? ?Brief Narrative:  ?81 year old female with history of PAF, complete heart block status post pacemaker that is not compatible to MRI, nonobstructive CAD, diastolic dysfunction, hypothyroidism, chronic kidney disease stage IIIb, recent multiple hospitalization presented to the ER on 3/23 with left eye swelling for 1 day. ? ?3/2-3/5, admitted to the hospital with syncopal episode in the setting of UTI. ?3/17-3/19, failure to thrive symptoms.  CT scan chest abdomen pelvis negative for malignancy.  Given prednisone on discharge for appetite stimulation. ?3/23, presented back to the hospital with left eye swelling, more slurred speech, neck swelling with difficulty swallowing and intermittent coughing while eating.  Patient noted to be breaking into rashes and progressive swelling of the left eye and difficulty breathing. ?At the emergency room afebrile with stable vital signs.  Creatinine 2.05.  Mildly elevated AST and ALT.  Chest x-ray was normal.  She was given 500 cc of normal saline and admitted to the hospital. ?3/24, she was on the medical floor.  Facial swelling worsened and she started having tongue swelling along with tachypnea and difficulty breathing.  She was found with edematous tongue, started on high-dose steroids , intubated and admitted to the intensive care unit. ? ? ?Assessment & Plan: ?  ?Angioedema rash , conjunctival ecchymosis, neck swelling and edema: Unknown cause.   ?Responded well to high-dose steroids.  IgA levels within normal limit.  Group A Streptococcus negative.  Neck swelling improving.  CT scan with no complications. ?Completed steroids.  Continue Pepcid.  Inflammation work-up mostly negative.  ESR and CRP trending down. ?Skin biopsy right arm 3/25, results are pending.  Rashes are already improving. ? ?Acute hypoxemic respiratory failure secondary to above: Intubated to  protect airway.  Extubated 3/27 and currently mostly on room air. Treated with Rocephin for presumed aspiration pneumonia, continue and complete 7 days of therapy.  Respiratory cultures positive for Klebsiella. ? ?Dysphagia: Secondary to above.  Speech therapy following.  Now has core track tube.  Mental status and wakefulness not helping with swallow testing.  We will continue.  Mouth care.  Okay for ice chips under supervision. ? ?Anemia of acute blood loss: Received 1 unit of PRBC with appropriate response.  Hemoglobin is stable. ? ?AKI on CKD stage IIIb: Treated with fluids. Advance diet through the core track tube.  Renal functions stable. ? ?Proximal A-fib, complete heart block status post pacemaker: Sinus rhythm.  Rate controlled.  On metoprolol.  Therapeutic on Eliquis.   ? ?Hypothyroidism: Synthroid replacement on high doses.  Continue. ? ?Progressive weakness, generalized weakness, failure to thrive: ?Patient with progressive failure to thrive, poor appetite for at least 3 months now. ?Multiple investigations including ?CT scan of the neck, CT scan of the chest abdomen pelvis with no evidence of structural malignancy. ?Recent investigations including E15, folic acid, TSH, copper, methylmalonic acid levels were normal. ?Electrolytes including magnesium phosphorus is normal.  Lupus antibody negative. Skin biopsy pending.  Cause unknown. ?Seen by neurology, do not think any spinal cord disease. ?Started on Marinol, will discontinue Remeron if that helps her to wake up. ?Appreciate palliative care involvement.  Now DNR with full scope of treatment. ? ? ?DVT prophylaxis: apixaban (ELIQUIS) tablet 2.5 mg Start: 03/20/22 2200 eliquis  ?apixaban (ELIQUIS) tablet 2.5 mg  ? ?Code Status: Full code. ?Family Communication: Patient's friend Hassan Rowan at bedside.  Family on the phone. ?Disposition Plan: Status is:  Inpatient ?Remains inpatient appropriate because: Unable to eat. ?  ? ? ?Consultants:  ?Neurology ?Critical  care ?Palliative care ? ?Procedures:  ?Multiple as above. ? ?Antimicrobials:  ?Currently on Rocephin day 7/7. ? ? ?Subjective: ? ?Seen and examined.  Onalee Hua at the bedside.  Patient nods okay to everything.  No overnight events. ? ? ?Objective: ?Vitals:  ? 03/21/22 1931 03/21/22 2332 03/22/22 2725 03/22/22 0750  ?BP: 136/72 119/77 118/66 131/79  ?Pulse: 91 80 79 86  ?Resp: 17 18 16    ?Temp: 97.6 ?F (36.4 ?C) 98 ?F (36.7 ?C) 97.9 ?F (36.6 ?C) 97.8 ?F (36.6 ?C)  ?TempSrc: Axillary Oral Axillary Axillary  ?SpO2: 98% 97% 96% 97%  ?Weight:      ?Height:      ? ? ?Intake/Output Summary (Last 24 hours) at 03/22/2022 1054 ?Last data filed at 03/21/2022 1235 ?Gross per 24 hour  ?Intake --  ?Output 800 ml  ?Net -800 ml  ? ? ?Filed Weights  ? 03/19/22 0325 03/20/22 0314 03/21/22 0500  ?Weight: 80.1 kg 78 kg 78.1 kg  ? ? ?Examination: ? ?General: Frail.  Debilitated.  Not in any distress.  Chronically sick looking.  On room air. ?Cardiovascular: S1-S2 normal.  Regular rate rhythm.  Pacemaker in place. ?Respiratory: Bilateral clear.  No extra sounds. ?Gastrointestinal: Soft.  Nontender.  Bowel sound present.  Tube feed infusing through the Dobbhoff tube. ?Ext: No edema or swelling.  No cyanosis. ?Neuro: Alert and awake on conversation, lethargic and sleepy.  Follows all commands.  Generalized weakness 4/5 all extremities. ?Musculoskeletal: No deformities. ?Rectal tube with loose stool. ? ? ? ? ?Data Reviewed: I have personally reviewed following labs and imaging studies ? ?CBC: ?Recent Labs  ?Lab 03/17/22 ?0202 03/18/22 ?0226 03/19/22 ?0241 03/20/22 ?0304 03/22/22 ?0112  ?WBC 9.1 17.0* 16.7* 17.8* 15.0*  ?NEUTROABS  --   --   --   --  13.2*  ?HGB 8.9* 10.5* 11.3* 12.0 11.9*  ?HCT 28.2* 32.3* 33.8* 37.4 38.5  ?MCV 93.7 90.5 88.3 89.7 92.5  ?PLT 154 195 181 202 182  ? ? ?Basic Metabolic Panel: ?Recent Labs  ?Lab 03/17/22 ?0202 03/18/22 ?0226 03/19/22 ?0241 03/20/22 ?0304 03/22/22 ?0112  ?NA 144 143 142 139 140  ?K 4.6 4.6 4.8  4.8 4.4  ?CL 114* 107 105 103 104  ?CO2 23 24 26 24 27   ?GLUCOSE 202* 200* 186* 182* 114*  ?BUN 85* 88* 70* 60* 61*  ?CREATININE 1.70* 1.64* 1.31* 1.17* 1.20*  ?CALCIUM 9.3 9.2 9.6 9.7 9.5  ?MG 2.3 1.9 1.9 1.8 1.7  ?PHOS 3.7 2.5 3.0 3.9 4.5  ? ? ?GFR: ?Estimated Creatinine Clearance: 36.2 mL/min (A) (by C-G formula based on SCr of 1.2 mg/dL (H)). ?Liver Function Tests: ?No results for input(s): AST, ALT, ALKPHOS, BILITOT, PROT, ALBUMIN in the last 168 hours. ? ?No results for input(s): LIPASE, AMYLASE in the last 168 hours. ?No results for input(s): AMMONIA in the last 168 hours. ?Coagulation Profile: ?No results for input(s): INR, PROTIME in the last 168 hours. ? ?Cardiac Enzymes: ?No results for input(s): CKTOTAL, CKMB, CKMBINDEX, TROPONINI in the last 168 hours. ? ?BNP (last 3 results) ?No results for input(s): PROBNP in the last 8760 hours. ?HbA1C: ?No results for input(s): HGBA1C in the last 72 hours. ?CBG: ?Recent Labs  ?Lab 03/21/22 ?1600 03/21/22 ?1935 03/21/22 ?2342 03/22/22 ?3664 03/22/22 ?0755  ?GLUCAP 154* [place] ? ?Lipid Profile: ?No results for input(s): CHOL, HDL, LDLCALC, TRIG, CHOLHDL, LDLDIRECT in  the last 72 hours. ? ?Thyroid Function Tests: ?No results for input(s): TSH, T4TOTAL, FREET4, T3FREE, THYROIDAB in the last 72 hours. ?Anemia Panel: ?No results for input(s): VITAMINB12, FOLATE, FERRITIN, TIBC, IRON, RETICCTPCT in the last 72 hours. ?Sepsis Labs: ?No results for input(s): PROCALCITON, LATICACIDVEN in the last 168 hours. ? ?Recent Results (from the past 240 hour(s))  ?Group A Strep by PCR     Status: None  ? Collection Time: 03/13/22  5:20 PM  ? Specimen: Throat; Sterile Swab  ?Result Value Ref Range Status  ? Group A Strep by PCR NOT DETECTED NOT DETECTED Final  ?  Comment: Performed at [facility], [place] [address] [place], [place] [address]  ?Respiratory (~20 pathogens) panel by PCR     Status: None  ? Collection Time: 03/13/22  7:06 PM  ? Specimen: Nasopharyngeal  Swab; Respiratory  ?Result Value Ref Range Status  ? Adenovirus NOT DETECTED NOT DETECTED Final  ? Coronavirus 229E NOT DETECTED NOT DETECTED Final  ?  Comment: (NOTE) ?The Coronavirus on the Respirator

## 2022-03-23 DIAGNOSIS — H5789 Other specified disorders of eye and adnexa: Secondary | ICD-10-CM | POA: Diagnosis not present

## 2022-03-23 LAB — GLUCOSE, CAPILLARY
Glucose-Capillary: 103 mg/dL — ABNORMAL HIGH (ref 70–99)
Glucose-Capillary: 150 mg/dL — ABNORMAL HIGH (ref 70–99)
Glucose-Capillary: 133 mg/dL — ABNORMAL HIGH (ref 70–99)
Glucose-Capillary: 123 mg/dL — ABNORMAL HIGH (ref 70–99)
Glucose-Capillary: 117 mg/dL — ABNORMAL HIGH (ref 70–99)

## 2022-03-23 MED ORDER — MELATONIN 3 MG PO TABS
3.0000 mg | ORAL_TABLET | Freq: Once | ORAL | Status: AC
  Administered 2022-03-23: 3 mg via ORAL
  Filled 2022-03-23: qty 1

## 2022-03-23 NOTE — Plan of Care (Signed)

## 2022-03-23 NOTE — Progress Notes (Signed)
?PROGRESS NOTE ? ? ? ?Lauren Henry  DJT:[id number] DOB: Jul 01, 1941 DOA: 03/13/2022 ?PCP: Haywood Pao, MD  ? ? ?Brief Narrative:  ?81 year old female with history of PAF, complete heart block status post pacemaker , nonobstructive CAD, diastolic dysfunction, hypothyroidism, chronic kidney disease stage IIIb, recent multiple hospitalization presented to the ER on 3/23 with left eye swelling for 1 day. ? ?3/2-3/5, admitted to the hospital with syncopal episode in the setting of UTI. ?3/17-3/19, failure to thrive symptoms.  CT scan chest abdomen pelvis negative for malignancy.  Given prednisone on discharge for appetite stimulation. ?3/23, presented back to the hospital with left eye swelling, more slurred speech, neck swelling with difficulty swallowing and intermittent coughing while eating.  Patient noted to be breaking into rashes and progressive swelling of the left eye and difficulty breathing. ?At the emergency room afebrile with stable vital signs.  Creatinine 2.05.  Mildly elevated AST and ALT.  Chest x-ray was normal.  She was given 500 cc of normal saline and admitted to the hospital. ?3/24, she was on the medical floor.  Facial swelling worsened and she started having tongue swelling along with tachypnea and difficulty breathing.  She was found with edematous tongue, started on high-dose steroids , intubated and admitted to the intensive care unit. ? ? ?Assessment & Plan: ?  ?Angioedema rash , conjunctival ecchymosis, neck swelling and edema:  ?Unknown cause.  Responded well to high-dose steroids.  IgA levels within normal limit.  Group A Streptococcus negative.  Neck swelling improving.  CT scan with no complications. ?Completed steroids.  Continue Pepcid.  Inflammation work-up mostly negative.  ESR and CRP trending down. Skin biopsy right arm 3/25, results are pending.   ?Rashes have improved and resolved. ? ?Acute hypoxemic respiratory failure secondary to above: Intubated to protect airway.   Extubated 3/27 and currently mostly on room air. Treated with Rocephin for presumed aspiration.  Completed antibiotic therapy.   ? ?Dysphagia: Secondary to above.  Speech therapy following.  Now has core track tube.  Mental status and wakefulness not helping with swallow testing.  We will continue.  Mouth care.  Okay for ice chips under supervision. ? ?Anemia of acute blood loss: Received 1 unit of PRBC with appropriate response.  Hemoglobin is stable since initial transfusion. ? ?AKI on CKD stage IIIb: At about her usual levels. ? ?Paroxysmal A-fib, complete heart block status post pacemaker: Sinus rhythm.  Rate controlled.  On metoprolol.  Therapeutic on Eliquis.   ? ?Hypothyroidism: Synthroid replacement on high doses.  Continue. ? ?Goal of care ,progressive weakness, generalized weakness, failure to thrive: ?Patient with progressive failure to thrive, poor appetite for at least 3 months now. ?Multiple investigations including CT scan of the neck, CT scan of the chest abdomen pelvis with no evidence of structural malignancy. Recent investigations including Z00, folic acid, TSH, copper, methylmalonic acid levels were normal. Electrolytes including magnesium phosphorus is normal.  Lupus antibody negative. Seen by neurology, do not think any spinal cord disease. ? ?Goal of care discussion 4/1, ?Communication with patient's husband every day ?Meeting with patient's husband and her daughter on 4/1.  Family is in understanding about severity of illness, difficulty with wakefulness and eating. ?We decided to continue tube feeding for next few days, depending upon her wakefulness and Ability to eat, if she eats then we will send her to skilled nursing for rehab. ?If she deteriorates, unable to eat we will explore hospice options. ?Want to keep patient more time to recover from  ICU induced delirium/neck swelling/debility. ?DNR/DNI. ?Palliative care following.  Marinol was not available to give through the tube, started on  Megace. ? ? ?DVT prophylaxis: apixaban (ELIQUIS) tablet 2.5 mg Start: 03/20/22 2200 eliquis  ?apixaban (ELIQUIS) tablet 2.5 mg  ? ?Code Status: Full code. ?Family Communication: Patient's friend Hassan Rowan at bedside.   ?Disposition Plan: Status is: Inpatient ?Remains inpatient appropriate because: Unable to eat. ?  ? ? ?Consultants:  ?Neurology ?Critical care ?Palliative care ? ?Procedures:  ?Multiple as above. ? ?Antimicrobials:  ?Currently on Rocephin day 7/7. ? ? ?Subjective: ? ?Seen and examined.  Awake and alert, nods to questions on interview.  She tells me she is feeling fine.  Overnight, her daughter stayed with her.  Patient had some delirium and was trying to get out of the bed multiple times.  In the morning, she feels better and well composed. ? ? ?Objective: ?Vitals:  ? 03/22/22 1927 03/22/22 2324 03/23/22 0340 03/23/22 4497  ?BP: (!) 145/81 (!) 99/59 105/67 119/66  ?Pulse: 77 77 74 88  ?Resp: 16 15 19 18   ?Temp: 98 ?F (36.7 ?C) 97.6 ?F (36.4 ?C) 98 ?F (36.7 ?C) 97.8 ?F (36.6 ?C)  ?TempSrc: Oral Axillary Oral Axillary  ?SpO2: 97% 96% 98%   ?Weight:      ?Height:      ? ?No intake or output data in the 24 hours ending 03/23/22 0936 ? ?Filed Weights  ? 03/19/22 0325 03/20/22 0314 03/21/22 0500  ?Weight: 80.1 kg 78 kg 78.1 kg  ? ? ?Examination: ? ?General: Frail.  Debilitated.  Not in any distress.  Chronically sick looking.  On room air. ?Dry tongue. ?Cardiovascular: S1-S2 normal.  Regular rate rhythm.  Pacemaker in place. ?Respiratory: Bilateral clear.  No extra sounds. ?Gastrointestinal: Soft.  Nontender.  Bowel sound present.  Tube feed infusing through the Dobbhoff tube. ?Ext: No edema or swelling.  No cyanosis. ?Neuro: Alert and awake on conversation, otherwise sleepy.  Follows all commands.  Generalized weakness 4/5 all extremities. ?Musculoskeletal: No deformities. ?Rectal tube with loose stool. ? ? ? ? ?Data Reviewed: I have personally reviewed following labs and imaging studies ? ?CBC: ?Recent Labs   ?Lab 03/17/22 ?0202 03/18/22 ?0226 03/19/22 ?0241 03/20/22 ?0304 03/22/22 ?0112  ?WBC 9.1 17.0* 16.7* 17.8* 15.0*  ?NEUTROABS  --   --   --   --  13.2*  ?HGB 8.9* 10.5* 11.3* 12.0 11.9*  ?HCT 28.2* 32.3* 33.8* 37.4 38.5  ?MCV 93.7 90.5 88.3 89.7 92.5  ?PLT 154 195 181 202 182  ? ?Basic Metabolic Panel: ?Recent Labs  ?Lab 03/17/22 ?0202 03/18/22 ?0226 03/19/22 ?0241 03/20/22 ?0304 03/22/22 ?0112  ?NA 144 143 142 139 140  ?K 4.6 4.6 4.8 4.8 4.4  ?CL 114* 107 105 103 104  ?CO2 23 24 26 24 27   ?GLUCOSE 202* 200* 186* 182* 114*  ?BUN 85* 88* 70* 60* 61*  ?CREATININE 1.70* 1.64* 1.31* 1.17* 1.20*  ?CALCIUM 9.3 9.2 9.6 9.7 9.5  ?MG 2.3 1.9 1.9 1.8 1.7  ?PHOS 3.7 2.5 3.0 3.9 4.5  ? ?GFR: ?Estimated Creatinine Clearance: 36.2 mL/min (A) (by C-G formula based on SCr of 1.2 mg/dL (H)). ?Liver Function Tests: ?No results for input(s): AST, ALT, ALKPHOS, BILITOT, PROT, ALBUMIN in the last 168 hours. ? ?No results for input(s): LIPASE, AMYLASE in the last 168 hours. ?No results for input(s): AMMONIA in the last 168 hours. ?Coagulation Profile: ?No results for input(s): INR, PROTIME in the last 168 hours. ? ?Cardiac Enzymes: ?No results for  input(s): CKTOTAL, CKMB, CKMBINDEX, TROPONINI in the last 168 hours. ? ?BNP (last 3 results) ?No results for input(s): PROBNP in the last 8760 hours. ?HbA1C: ?No results for input(s): HGBA1C in the last 72 hours. ?CBG: ?Recent Labs  ?Lab 03/22/22 ?1639 03/22/22 ?1930 03/22/22 ?2341 03/23/22 ?0428 03/23/22 ?0837  ?GLUCAP 152* 86 127* 103* 150*  ? ?Lipid Profile: ?No results for input(s): CHOL, HDL, LDLCALC, TRIG, CHOLHDL, LDLDIRECT in the last 72 hours. ? ?Thyroid Function Tests: ?No results for input(s): TSH, T4TOTAL, FREET4, T3FREE, THYROIDAB in the last 72 hours. ?Anemia Panel: ?No results for input(s): VITAMINB12, FOLATE, FERRITIN, TIBC, IRON, RETICCTPCT in the last 72 hours. ?Sepsis Labs: ?No results for input(s): PROCALCITON, LATICACIDVEN in the last 168 hours. ? ?Recent Results (from the  past 240 hour(s))  ?Group A Strep by PCR     Status: None  ? Collection Time: 03/13/22  5:20 PM  ? Specimen: Throat; Sterile Swab  ?Result Value Ref Range Status  ? Group A Strep by PCR NOT DETECTED NOT D

## 2022-03-23 NOTE — Plan of Care (Signed)
Pt not communicative verbally, but does seem to nod appropriately with daughter at bedside.  Pt continues to have diarrhea, with FMS remaining in place.  Pt comfortable throughout shift.   ?Problem: Elimination: ?Goal: Will not experience complications related to bowel motility ?Outcome: Not Progressing ?  ?

## 2022-03-24 ENCOUNTER — Inpatient Hospital Stay (HOSPITAL_COMMUNITY): Payer: Medicare PPO

## 2022-03-24 DIAGNOSIS — Z515 Encounter for palliative care: Secondary | ICD-10-CM | POA: Diagnosis not present

## 2022-03-24 DIAGNOSIS — H5789 Other specified disorders of eye and adnexa: Secondary | ICD-10-CM | POA: Diagnosis not present

## 2022-03-24 LAB — GLUCOSE, CAPILLARY
Glucose-Capillary: 103 mg/dL — ABNORMAL HIGH (ref 70–99)
Glucose-Capillary: 116 mg/dL — ABNORMAL HIGH (ref 70–99)
Glucose-Capillary: 117 mg/dL — ABNORMAL HIGH (ref 70–99)
Glucose-Capillary: 118 mg/dL — ABNORMAL HIGH (ref 70–99)
Glucose-Capillary: 135 mg/dL — ABNORMAL HIGH (ref 70–99)
Glucose-Capillary: 73 mg/dL (ref 70–99)

## 2022-03-24 MED ORDER — DEXTROSE IN LACTATED RINGERS 5 % IV SOLN: INTRAVENOUS | Status: DC

## 2022-03-24 MED ORDER — METOPROLOL TARTRATE 5 MG/5ML IV SOLN
2.5000 mg | Freq: Once | INTRAVENOUS | Status: AC
  Administered 2022-03-24: 2.5 mg via INTRAVENOUS
  Filled 2022-03-24: qty 5

## 2022-03-24 NOTE — Progress Notes (Signed)
Patient's cortrak removed per Kathlen Mody MD's orders.  ?

## 2022-03-24 NOTE — Progress Notes (Signed)
Speech Language Pathology Treatment: Dysphagia  ?Patient Details ?Name: Lauren Henry ?MRN: [id number] ?DOB: October 10, 1941 ?Today's Date: 03/24/2022 ?Time: [id number] ?SLP Time Calculation (min) (ACUTE ONLY): 18 min ? ?Assessment / Plan / Recommendation ?Clinical Impression ? Pt is alert this morning and attempting to communicate verbally, although voice is dysphonic. SLP provided oral care with removal of a thick layer of secretions from her hard and soft palate, which when combined with even a little bit of ice and water, facilitate increased volume. Small spoonfuls of water initially resulted in multiple, audible swallows and immediate coughing that sounded weak. She may need a little moisture to clear some possible secretions in her pharynx, although this could also be indicative of persistent dysphagia. However, in discussion with pt and her friend who has been caregiving for her in the mornings, her mentation has had a noticeable improvement since yesterday, and pt is reportedly eager for her Cortrak to come out. In discussion with MD, plan is also to repeat testing on a day when the Cortrak team is present to be able to pull and potentially replace Cortrak depending on results of the study, so will proceed with repeat MBS this afternoon, tentatively scheduled with radiology. Would continue to offer ice chips after oral care to provide moisture and encourage some use of her swallowing musculature between now and then.  ?  ?HPI HPI: Patient is an 81 y.o. female with PMH: afib, complete heart block s/p pacemaker (not MRI compatable), nonobstructive CAD, CKD stage III who presented to the hospital on 3/23 with left eye swelling. Patient had been recently hospitalized from 3/2 to 3/5 after having syncopal episode in setting of UTI where she was taken off blood pressure medications and treated with antibiotics. She was then hospitalized 3/17-3/19 for FTT symptoms with her husband reporting a progressive decline and at  least 30lb weight loss since end of December. There was concern for possible underlying malignany but CT chest, abdomen and pelvis did not show any acute cause for her symptoms. Scans noted an 8 mm noncalcified nodule of the right lower lobe for which repeat CT scan recommended in 2 to 3 months.  She was also noted to have new tremor that was not thought to be parkinsonian in nature with mildly elevated free T4, but levothyroxine dose was deferred to PCP. In ED, patient was afebrile and vital signs relatively stable, CXR showed no acute disease. On 3/24 RN called into patient's room by husband due to concerns of trouble breathing. Rapid Response was called and patient started on 2L [place] and transfered to unit 51M. on 3/25 patient with increased WOB and CXR showing interstitial changes, possible aspiration. She was intubated at [facility] on 3/25 and extubated to 4L via [place] on 3/27 at 1018. ?  ?   ?SLP Plan ? MBS ? ?  ?  ?Recommendations for follow up therapy are one component of a multi-disciplinary discharge planning process, led by the attending physician.  Recommendations may be updated based on patient status, additional functional criteria and insurance authorization. ?  ? ?Recommendations  ?Diet recommendations: NPO;Other(comment) (ice chips) ?Medication Administration: Via alternative means  ?   ?    ?   ? ? ? ? Oral Care Recommendations: Oral care QID;Staff/trained caregiver to provide oral care ?Follow Up Recommendations: Skilled nursing-short term rehab (<3 hours/day) ?Assistance recommended at discharge: Frequent or constant Supervision/Assistance ?SLP Visit Diagnosis: Dysphagia, unspecified (R13.10) ?Plan: MBS ? ? ? ? ?  ?  ? ? ?Osie Bond.,  M.A. CCC-SLP ?Acute Rehabilitation Services ?Pager [phone] ?Office [phone] ? ? ?03/24/2022, 11:48 AM ?

## 2022-03-24 NOTE — Progress Notes (Signed)
?PROGRESS NOTE ? ? ? ?Lauren Henry  XKG:[id number] DOB: 1941-01-25 DOA: 03/13/2022 ?PCP: Haywood Pao, MD  ? ? ?Brief Narrative:  ?81 year old female with history of PAF, complete heart block status post pacemaker , nonobstructive CAD, diastolic dysfunction, hypothyroidism, chronic kidney disease stage IIIb, recent multiple hospitalization presented to the ER on 3/23 with left eye swelling for 1 day. ? ?3/2-3/5, admitted to the hospital with syncopal episode in the setting of UTI. ?3/17-3/19, failure to thrive symptoms.  CT scan chest abdomen pelvis negative for malignancy.  Given prednisone on discharge for appetite stimulation. ?3/23, presented back to the hospital with left eye swelling, more slurred speech, neck swelling with difficulty swallowing and intermittent coughing while eating.  Patient noted to be breaking into rashes and progressive swelling of the left eye and difficulty breathing. ?At the emergency room afebrile with stable vital signs.  Creatinine 2.05.  Mildly elevated AST and ALT.  Chest x-ray was normal.  She was given 500 cc of normal saline and admitted to the hospital. ?3/24, she was on the medical floor.  Facial swelling worsened and she started having tongue swelling along with tachypnea and difficulty breathing.  She was found with edematous tongue, started on high-dose steroids , intubated and admitted to the intensive care unit. ? ?Patient remains very debilitated, currently unable to swallow safely and on Dobbhoff tube feeding. ? ? ?Assessment & Plan: ?  ?Angioedema rash , conjunctival ecchymosis, neck swelling and edema:  ?Unknown cause.  Responded well to high-dose steroids.  IgA levels within normal limit.  Group A Streptococcus negative.  Neck swelling improving.  CT scan with no complications. ?Completed steroids.  Continue Pepcid.  Inflammation work-up mostly negative.  ESR and CRP trending down. Skin biopsy right arm 3/25, results are pending.   ?Rashes have improved and  resolved. ? ?Acute hypoxemic respiratory failure secondary to above: Intubated to protect airway.  Extubated 3/27 and currently mostly on room air. Treated with Rocephin for presumed aspiration.  Completed antibiotic therapy.   ? ?Dysphagia: Secondary to above.  Speech therapy following.  has core track tube.  Mental status and wakefulness not helping with swallow testing.   Mouth care.  Okay for ice chips under supervision. ?-She will have repeat swallow evaluation, modified barium swallow today. ? ?Anemia of acute blood loss: Received 1 unit of PRBC with appropriate response.  Hemoglobin is stable since initial transfusion. ? ?AKI on CKD stage IIIb: At about her usual levels. ? ?Paroxysmal A-fib, complete heart block status post pacemaker: Sinus rhythm.  Rate controlled.  On metoprolol.  Therapeutic on Eliquis.   ? ?Hypothyroidism: Synthroid replacement on high doses.  Continue. ? ?Goal of care ,progressive weakness, generalized weakness, failure to thrive: ?Patient with progressive failure to thrive, poor appetite for at least 3 months now. ?Multiple investigations including CT scan of the neck, CT scan of the chest abdomen pelvis with no evidence of structural malignancy. Recent investigations including F02, folic acid, TSH, copper, methylmalonic acid levels were normal. Electrolytes including magnesium phosphorus is normal.  Lupus antibody negative. Seen by neurology, do not think any spinal cord disease. ? ?Goal of care discussion 4/1, ?Communication with patient's husband every day ?Met with patient's husband and her daughter on 4/1.  Family is in understanding about severity of illness, difficulty with wakefulness and eating. ?Hoping patient will be more awake and able to swallow.  If she is able to swallow, will pursue skilled nursing facility placement with outpatient palliative follow-up.  If unable,  will start palliation and hospice discussion. ?Want to keep patient more time to recover from ICU induced  delirium/neck swelling/debility. ?DNR/DNI. ?Palliative care following.  Marinol was not available to give through the tube, started on Megace. ? ? ?DVT prophylaxis: apixaban (ELIQUIS) tablet 2.5 mg Start: 03/20/22 2200 eliquis  ?apixaban (ELIQUIS) tablet 2.5 mg  ? ?Code Status: Full code. ?Family Communication: Patient's friend Hassan Rowan at bedside.   ?Disposition Plan: Status is: Inpatient ?Remains inpatient appropriate because: Unable to eat. ?  ? ? ?Consultants:  ?Neurology ?Critical care ?Palliative care ? ?Procedures:  ?Multiple as above. ? ?Antimicrobials:  ?Completed antibiotics. ? ? ?Subjective: ? ?Patient seen and examined.  No overnight events.  Patient is awake to stimulation.  She is able to respond appropriately, unable to much articulate because of dry mouth.  She tells me that she cannot swallow.  She tells me she is hungry, however unreliable.  Tube feed is infusing. ? ?Objective: ?Vitals:  ? 03/23/22 2355 03/24/22 0350 03/24/22 0352 03/24/22 0752  ?BP: 119/72 130/78  131/70  ?Pulse: 76 81 81 85  ?Resp: 18 20  17   ?Temp: 98 ?F (36.7 ?C) (!) 97.2 ?F (36.2 ?C)  97.9 ?F (36.6 ?C)  ?TempSrc: Axillary Axillary  Axillary  ?SpO2: 96% 98% 98% 95%  ?Weight:      ?Height:      ? ? ?Intake/Output Summary (Last 24 hours) at 03/24/2022 1124 ?Last data filed at 03/23/2022 1651 ?Gross per 24 hour  ?Intake --  ?Output 500 ml  ?Net -500 ml  ? ? ?Filed Weights  ? 03/19/22 0325 03/20/22 0314 03/21/22 0500  ?Weight: 80.1 kg 78 kg 78.1 kg  ? ? ?Examination: ? ?General: Frail and debilitated.  Chronically sick looking.  Not in any distress. ?Mostly stays eyes closed, responds to stimulation.  Answers basic questions.  Follows commands. ?Cardiovascular: S1-S2 normal.  Regular rate rhythm.  Pacemaker in place. ?Respiratory: Bilateral clear.  No added sounds. ?Gastrointestinal: Soft.  Nontender.  Bowel sound present.  Minimum loose stool in the rectal tube. ?Ext: No edema or cyanosis.  Patient does have some black discoloration on  the tip of the middle fingers without obvious gangrenous changes, wrist pulses are normal. ?Neuro: Alert and awake on stimulation.  Otherwise mostly sleepy and lethargic. ?Musculoskeletal: No deformities. ?Skin: Intact. ? ? ? ? ? ?Data Reviewed: I have personally reviewed following labs and imaging studies ? ?CBC: ?Recent Labs  ?Lab 03/18/22 ?0226 03/19/22 ?0241 03/20/22 ?0304 03/22/22 ?0112  ?WBC 17.0* 16.7* 17.8* 15.0*  ?NEUTROABS  --   --   --  13.2*  ?HGB 10.5* 11.3* 12.0 11.9*  ?HCT 32.3* 33.8* 37.4 38.5  ?MCV 90.5 88.3 89.7 92.5  ?PLT 195 181 202 182  ? ?Basic Metabolic Panel: ?Recent Labs  ?Lab 03/18/22 ?0226 03/19/22 ?0241 03/20/22 ?0304 03/22/22 ?0112  ?NA 143 142 139 140  ?K 4.6 4.8 4.8 4.4  ?CL 107 105 103 104  ?CO2 24 26 24 27   ?GLUCOSE 200* 186* 182* 114*  ?BUN 88* 70* 60* 61*  ?CREATININE 1.64* 1.31* 1.17* 1.20*  ?CALCIUM 9.2 9.6 9.7 9.5  ?MG 1.9 1.9 1.8 1.7  ?PHOS 2.5 3.0 3.9 4.5  ? ?GFR: ?Estimated Creatinine Clearance: 36.2 mL/min (A) (by C-G formula based on SCr of 1.2 mg/dL (H)). ?Liver Function Tests: ?No results for input(s): AST, ALT, ALKPHOS, BILITOT, PROT, ALBUMIN in the last 168 hours. ? ?No results for input(s): LIPASE, AMYLASE in the last 168 hours. ?No results for input(s): AMMONIA in the  last 168 hours. ?Coagulation Profile: ?No results for input(s): INR, PROTIME in the last 168 hours. ? ?Cardiac Enzymes: ?No results for input(s): CKTOTAL, CKMB, CKMBINDEX, TROPONINI in the last 168 hours. ? ?BNP (last 3 results) ?No results for input(s): PROBNP in the last 8760 hours. ?HbA1C: ?No results for input(s): HGBA1C in the last 72 hours. ?CBG: ?Recent Labs  ?Lab 03/23/22 ?1602 03/23/22 ?2007 03/23/22 ?2358 03/24/22 ?0354 03/24/22 ?0757  ?GLUCAP 123* 117* 117* 116* 135*  ? ?Lipid Profile: ?No results for input(s): CHOL, HDL, LDLCALC, TRIG, CHOLHDL, LDLDIRECT in the last 72 hours. ? ?Thyroid Function Tests: ?No results for input(s): TSH, T4TOTAL, FREET4, T3FREE, THYROIDAB in the last 72  hours. ?Anemia Panel: ?No results for input(s): VITAMINB12, FOLATE, FERRITIN, TIBC, IRON, RETICCTPCT in the last 72 hours. ?Sepsis Labs: ?No results for input(s): PROCALCITON, LATICACIDVEN in the last 168 hours. ?

## 2022-03-24 NOTE — Progress Notes (Signed)
Modified Barium Swallow Progress Note ? ?Patient Details  ?Name: Lauren Henry ?MRN: [id number] ?Date of Birth: 1941-09-24 ? ?Today's Date: 03/24/2022 ? ?Modified Barium Swallow completed.  Full report located under Chart Review in the Imaging Section. ? ?Brief recommendations include the following: ? ?Clinical Impression ? Patient more lethargic during today's MBS than she was for inital one on 3/30. Cortrak feeding tube had been removed prior to this study. SLP had to continually provide tactile and verbal cues which resulted in patient becoming very briefly alert. Prior to and during study, SLP provided oral care to remove thick secretions adhered to soft palate, on teeth and along gum line. She did accept on very small spoon sip of thin liquid and one very small cup sip of thin liquid barium as well as a very small amount of puree solid. Patient exhibited both oral and pharyngeal phase impairments, requiring verbal and tactile cues consistently perform swallows. During pharyngeal phase, very minimal pharyngeal contraction and hyolaryngeal movement observed and limited to no UES opening was achieved. Patient was able to perform cued cough which resulted in some secretions transiting into oral cavity and SLP then removing with toothette sponge. Unfortunately, patient continues to be significantly lethargic, is still not managing secretions and even with Cotrak tube removed, her ability to swallow is severely impaired. Recommendation is to continue NPO status but continue to allow ice chips in small amounts PRN when patient adequately alert and allow for small controlled cup sips of water with SLP and/or nursing only. ?  ?Swallow Evaluation Recommendations ? ?   ? ? SLP Diet Recommendations: NPO;Ice chips PRN after oral care ? ?   ? ?   ? ? Supervision: Staff to assist with self feeding;Full assist for feeding;Full supervision/cueing for compensatory strategies ? ?   ? ? Postural Changes: Seated upright at 90  degrees ? ? Oral Care Recommendations: Oral care QID;Staff/trained caregiver to provide oral care ? ?   ? ? ? ?Angela Nevin, MA, CCC-SLP ?Speech Therapy ? ?

## 2022-03-24 NOTE — Plan of Care (Signed)

## 2022-03-24 NOTE — Progress Notes (Signed)
? ?Palliative Medicine Inpatient Follow Up Note ? ? ?HPI: ?81 year old female with history of PAF, complete heart block status post pacemaker which is not compatible to MRI, nonobstructive CAD, diastolic dysfunction, hypothyroidism, chronic kidney disease stage IIIb, recent multiple hospitalization presented to the ER on 3/23 with left eye swelling for 1 day. 3/2-3/5, admitted to the hospital with syncopal episode in the setting of UTI. 3/17-3/19, failure to thrive symptoms.  CT scan chest abdomen pelvis negative for malignancy.  Given prednisone on discharge for appetite stimulation. 3/23, presented back to the hospital with left eye swelling, more slurred speech, neck swelling with difficulty swallowing and intermittent coughing while eating.  Patient noted to be breaking into rashes and progressive swelling of the left eye and difficulty breathing. At the emergency room afebrile with stable vital signs.  Creatinine 2.05.  Mildly elevated AST and ALT.  Chest x-ray was normal.  She was given 500 cc of normal saline and admitted to the hospital. 3/24, she was on the medical floor.  Facial swelling worsened and she started having tongue swelling along with tachypnea and difficulty breathing.  She was found with edematous tongue, started on high-dose steroids , intubated and admitted to the intensive care unit. ?  ?Palliative care has been asked to get involved to further address goals of care. ? ?Today's Discussion (03/24/2022): ? ?*Please note that this is a verbal dictation therefore any spelling or grammatical errors are due to the "[employer]" system interpretation. ? ?Chart reviewed inclusive of vital signs, progress notes, laboratory results, and diagnostic images.  ? ?I met with Lauren Henry and her spouse, Lauren Henry this morning. Lauren Henry states that she slept well overnight. Lauren Henry is able to vocalize more easily as it appears the swelling her tongue went down. Lauren Henry shares optimism for her swallow evaluation  later today. He goes on to tell me that he believes she is turning the corner in a positive way.  ? ?We reviewed the importance of her getting OOB today. I shared that her road may be challenging moving forward though my role is to advocate for what Lauren Henry would want.  ? ?Lauren Henry and I agreed that I would stop by later today once the swallow evaluation has been completed for additional conversations. ?__________________________________________________ ?Addendum: ? ?I spoke to [employer] who shares she has been very lethargic throughout the better part of the day.  ? ?I met with Lauren Henry this early evening. Her NGT was noted to be out.  ? ?Lauren Henry's spouse and son were present at bedside.  ? ?She was much more lethargic and needed great prompting for arousal. When awakened it would only last a few moments. Patients family share that she has not been awake enough to get out of bed.  ? ?I asked if we could take a few moments to discuss the worst case scenario of Lauren Henry not passing her swallow evaluation tomorrow. Patient spouse, Lauren Henry asked if we could hold off on this conversation and see how tomorrow morning goes. I shared that this would be fine.  ? ?Created space and opportunity for patient to explore thoughts feelings and fears regarding current medical situation. Patients family remain optimistic. They request for a formal meeting tomorrow following the swallow evaluation if Lauren Henry does not fair well.  ? ?Questions and concerns addressed  ? ?Palliative Support Provided ? ?Objective Assessment: ?Vital Signs ?Vitals:  ? 03/24/22 1100 03/24/22 1214  ?BP:  105/71  ?Pulse:  75  ?Resp: 17 16  ?Temp:  (!) 97.4 ?  F (36.3 ?C)  ?SpO2:  98%  ? ? ?Intake/Output Summary (Last 24 hours) at 03/24/2022 1638 ?Last data filed at 03/23/2022 1651 ?Gross per 24 hour  ?Intake --  ?Output 500 ml  ?Net -500 ml  ? ?Last Weight  Most recent update: 03/21/2022  5:05 AM  ? ? Weight  ?78.1 kg (172 lb 2.9 oz)  ?      ? ?  ? ?Gen: Elderly Caucasian  female in no acute distress ?HEENT: Dry mucous membranes ?CV: Regular rate and irregular rhythm ?PULM: On room air ?ABD: soft/nontender ?EXT: No edema ?Neuro: Lethargic  ? ?SUMMARY OF RECOMMENDATIONS   ?DNAR ?  ?Continue to treat what is treatable ?  ?Repeat MBS tomorrow --> Was too lethargic today ?  ?If/when able to swallow --> Marinol 2.5mg PO BID ?  ?Plan for Palliative care to meet with family tomorrow after swallow evaluation --> I will not be present though I will request once of my colleagues follows up  ? ?Time Spent: 65 ? ?MDM - High ?______________________________________________________________________________________ ?  ?[facility] Team ?Team Cell Phone: [phone] ?Please utilize secure chat with additional questions, if there is no response within 30 minutes please call the above phone number ? ?Palliative Medicine Team providers are available by phone from 7am to 7pm daily and can be reached through the team cell phone.  ?Should this patient require assistance outside of these hours, please call the patient's attending physician. ? ? ? ? ?

## 2022-03-24 NOTE — Progress Notes (Signed)
PT Cancellation Note ? ?Patient Details ?Name: Lauren Henry ?MRN: [id number] ?DOB: 1941/05/13 ? ? ?Cancelled Treatment:    Reason Eval/Treat Not Completed: Patient at procedure or test/unavailable ? ?Patient going for swallow study. RN removing cortrak prior to leaving with transport. ? ? ?Arby Barrette, PT ?Acute Rehabilitation Services  ?Pager [phone] ?Office [phone] ? ?[name] ?03/24/2022, 1:56 PM ?

## 2022-03-25 ENCOUNTER — Inpatient Hospital Stay (HOSPITAL_COMMUNITY): Payer: Medicare PPO

## 2022-03-25 DIAGNOSIS — R4182 Altered mental status, unspecified: Secondary | ICD-10-CM | POA: Diagnosis not present

## 2022-03-25 DIAGNOSIS — H5789 Other specified disorders of eye and adnexa: Secondary | ICD-10-CM | POA: Diagnosis not present

## 2022-03-25 LAB — COMPREHENSIVE METABOLIC PANEL
ALT: 53 U/L — ABNORMAL HIGH (ref 0–44)
AST: 26 U/L (ref 15–41)
Albumin: 2.5 g/dL — ABNORMAL LOW (ref 3.5–5.0)
Alkaline Phosphatase: 64 U/L (ref 38–126)
Anion gap: 10 (ref 5–15)
BUN: 43 mg/dL — ABNORMAL HIGH (ref 8–23)
CO2: 22 mmol/L (ref 22–32)
Calcium: 9.7 mg/dL (ref 8.9–10.3)
Chloride: 106 mmol/L (ref 98–111)
Creatinine, Ser: 1.11 mg/dL — ABNORMAL HIGH (ref 0.44–1.00)
GFR, Estimated: 50 mL/min — ABNORMAL LOW (ref >60)
Glucose, Bld: 86 mg/dL (ref 70–99)
Potassium: 4.4 mmol/L (ref 3.5–5.1)
Sodium: 138 mmol/L (ref 135–145)
Total Bilirubin: 0.6 mg/dL (ref 0.3–1.2)
Total Protein: 6.3 g/dL — ABNORMAL LOW (ref 6.5–8.1)

## 2022-03-25 LAB — GLUCOSE, CAPILLARY
Glucose-Capillary: 107 mg/dL — ABNORMAL HIGH (ref 70–99)
Glucose-Capillary: 64 mg/dL — ABNORMAL LOW (ref 70–99)
Glucose-Capillary: 72 mg/dL (ref 70–99)
Glucose-Capillary: 76 mg/dL (ref 70–99)
Glucose-Capillary: 85 mg/dL (ref 70–99)
Glucose-Capillary: 96 mg/dL (ref 70–99)
Glucose-Capillary: 97 mg/dL (ref 70–99)
Glucose-Capillary: 97 mg/dL (ref 70–99)

## 2022-03-25 LAB — CBC WITH DIFFERENTIAL/PLATELET
Abs Immature Granulocytes: 0.36 10*3/uL — ABNORMAL HIGH (ref 0.00–0.07)
Basophils Absolute: 0.1 10*3/uL (ref 0.0–0.1)
Basophils Relative: 1 %
Eosinophils Absolute: 0.3 10*3/uL (ref 0.0–0.5)
Eosinophils Relative: 2 %
HCT: 39.1 % (ref 36.0–46.0)
Hemoglobin: 12.3 g/dL (ref 12.0–15.0)
Immature Granulocytes: 3 %
Lymphocytes Relative: 8 %
Lymphs Abs: 1.1 10*3/uL (ref 0.7–4.0)
MCH: 29 pg (ref 26.0–34.0)
MCHC: 31.5 g/dL (ref 30.0–36.0)
MCV: 92.2 fL (ref 80.0–100.0)
Monocytes Absolute: 0.8 10*3/uL (ref 0.1–1.0)
Monocytes Relative: 6 %
Neutro Abs: 11.3 10*3/uL — ABNORMAL HIGH (ref 1.7–7.7)
Neutrophils Relative %: 80 %
Platelets: 148 10*3/uL — ABNORMAL LOW (ref 150–400)
RBC: 4.24 MIL/uL (ref 3.87–5.11)
RDW: 17 % — ABNORMAL HIGH (ref 11.5–15.5)
WBC: 13.9 10*3/uL — ABNORMAL HIGH (ref 4.0–10.5)
nRBC: 0 % (ref 0.0–0.2)

## 2022-03-25 LAB — BLOOD GAS, ARTERIAL
Acid-Base Excess: 0.3 mmol/L (ref 0.0–2.0)
Bicarbonate: 23.3 mmol/L (ref 20.0–28.0)
O2 Saturation: 98.4 %
Patient temperature: 36.7
pCO2 arterial: 32 mmHg (ref 32–48)
pH, Arterial: 7.47 — ABNORMAL HIGH (ref 7.35–7.45)
pO2, Arterial: 83 mmHg (ref 83–108)

## 2022-03-25 LAB — PHOSPHORUS: Phosphorus: 3.3 mg/dL (ref 2.5–4.6)

## 2022-03-25 LAB — MAGNESIUM: Magnesium: 1.7 mg/dL (ref 1.7–2.4)

## 2022-03-25 LAB — AMMONIA: Ammonia: 17 umol/L (ref 9–35)

## 2022-03-25 MED ORDER — DEXTROSE 50 % IV SOLN
INTRAVENOUS | Status: AC
  Filled 2022-03-25: qty 50

## 2022-03-25 MED ORDER — NYSTATIN 100000 UNIT/ML MT SUSP
5.0000 mL | Freq: Four times a day (QID) | OROMUCOSAL | Status: DC
  Administered 2022-03-25 – 2022-03-31 (×16): 500000 [IU] via ORAL
  Filled 2022-03-25 (×15): qty 5

## 2022-03-25 MED ORDER — DEXTROSE 50 % IV SOLN
12.5000 g | INTRAVENOUS | Status: AC
  Administered 2022-03-25: 12.5 g via INTRAVENOUS

## 2022-03-25 MED ORDER — HALOPERIDOL LACTATE 5 MG/ML IJ SOLN
1.0000 mg | Freq: Three times a day (TID) | INTRAMUSCULAR | Status: DC | PRN
  Administered 2022-03-25: 1 mg via INTRAVENOUS
  Filled 2022-03-25: qty 1

## 2022-03-25 NOTE — Progress Notes (Signed)
Palliative: ? ?I went to follow up with Lauren Henry and her family but she was working with therapy. I went back and she is out of room and no family at bedside. I will follow up with them tomorrow.  ? ?No charge ? ?Yong Channel, NP ?Palliative Medicine Team ?Pager [phone] (Please see [email or web address] for schedule) ?Team Phone [phone]  ?

## 2022-03-25 NOTE — Progress Notes (Signed)
vLTM started  All impedances below 10kohms.  [facility] to monitor    Patient event button tested 

## 2022-03-25 NOTE — Progress Notes (Signed)
Modified Barium Swallow Progress Note ? ?Patient Details  ?Name: Lauren Henry ?MRN: [id number] ?Date of Birth: October 02, 1941 ? ?Today's Date: 03/25/2022 ? ?Modified Barium Swallow completed.  Full report located under Chart Review in the Imaging Section. ? ?Brief recommendations include the following: ? ?Clinical Impression ? Patient was able to maintain enough alertness to actively perform swallows during this MBS, though she remains lethargic and with participation declining over time. She exhibited mild-moderate aspiration during the swallow with thin liquids which was sensed but cough not effective to clear aspirate. She was able to drink nectar thick liquids via cup and straw sips and exhibited trace penetration above vocal cords but no aspiration before, during or after swallow. Puree solids resulted in moderate amount of vallecular residuals which did not fully clear even with cued repeat dry swallows. Chin tuck posture performed when drinking nectar thick liquids was effective to prevent penetration and also prevent any pharyngeal residuals from collecting. SLP is recommending to continue NPO but for trials of puree solids and nectar thick liquids with SLP only. Plan to continue with family education and training for safe PO administration. ?  ?Swallow Evaluation Recommendations ? ?   ? ? SLP Diet Recommendations: Other (Comment) (NPO, ice chips PRN, puree solid, nectar thick liquid trials with SLP only) ? ? Liquid Administration via: Cup;Straw ? ? Medication Administration: Via alternative means ? ?   ? ? Compensations: Chin tuck ? ? Postural Changes: Seated upright at 90 degrees ? ? Oral Care Recommendations: Oral care QID;Staff/trained caregiver to provide oral care ? ?   ? ? ? ?Angela Nevin, MA, CCC-SLP ?Speech Therapy ? ?

## 2022-03-25 NOTE — Progress Notes (Signed)
?PROGRESS NOTE ? ? ? ?Lauren Henry  WUJ:[id number] DOB: [birth year] DOA: 03/13/2022 ?PCP: Haywood Pao, MD  ? ? ?Brief Narrative:  ?81 year old female with history of PAF, complete heart block status post pacemaker , nonobstructive CAD, diastolic dysfunction, hypothyroidism, chronic kidney disease stage IIIb, recent multiple hospitalization presented to the ER on 3/23 with left eye swelling for 1 day. ? ?3/2-3/5, admitted to the hospital with syncopal episode in the setting of UTI. ?3/17-3/19, failure to thrive symptoms.  CT scan chest abdomen pelvis negative for malignancy.  Given prednisone on discharge for appetite stimulation. ?3/23, presented back to the hospital with left eye swelling, more slurred speech, neck swelling with difficulty swallowing and intermittent coughing while eating.  Patient noted to be breaking into rashes and progressive swelling of the left eye and difficulty breathing. ?At the emergency room afebrile with stable vital signs.  Creatinine 2.05.  Mildly elevated AST and ALT.  Chest x-ray was normal.  She was given 500 cc of normal saline and admitted to the hospital. ? ?3/24, she was on the medical floor.  Facial swelling worsened and she started having tongue swelling along with tachypnea and difficulty breathing.  She was found with edematous tongue, started on high-dose steroids ,intubated and admitted to the intensive care unit. ? ?Remains in the hospital.  Fluctuating mental status.  Dramatic changes in mental status. ?4/4, after abnormal mentation in the morning, patient suddenly more lethargic and difficult to awake. ? ? ?Assessment & Plan: ?  ?Acute metabolic encephalopathy: ?Multiple head CT scans were without any abnormal findings.  Pacemaker incompatible to brain MRI. ?Patient started demonstrating drastic/dramatic changes in mental status over time. ?Ammonia is normal. ?ABG with no CO2 retention. ?Repeat CT head today. ?Rule out subclinical seizures, stat EEG.  May benefit  with long-term EEG, consult neurology. ? ?Angioedema rash , conjunctival ecchymosis, neck swelling and edema:  ?Unknown cause.  Responded well to high-dose steroids.  IgA levels within normal limit.  Group A Streptococcus negative.  Neck swelling improving.  CT scan with no complications. ?Completed steroids.  Continue Pepcid.  Inflammation work-up mostly negative.  ESR and CRP trending down. Skin biopsy right arm 3/25, results are pending.   ?Rashes have improved and resolved. ? ?Acute hypoxemic respiratory failure secondary to above: Intubated to protect airway.  Extubated 3/27 and currently mostly on room air. Treated with Rocephin for presumed aspiration.  Completed antibiotic therapy.   ? ?Dysphagia: With fluctuating mental status.  Speech swallow evaluation when able.   ? ?Anemia of acute blood loss: Received 1 unit of PRBC with appropriate response.  Hemoglobin is stable since initial transfusion. ? ?AKI on CKD stage IIIb: At about her usual levels. ? ?Paroxysmal A-fib, complete heart block status post pacemaker: Sinus rhythm.  Rate controlled.  On metoprolol.  Therapeutic on Eliquis.   ? ?Hypothyroidism: Synthroid replacement on high doses.  Continue. ? ?Goal of care ,progressive weakness, generalized weakness, failure to thrive: ?Patient with progressive failure to thrive, poor appetite for at least 3 months now. ?Multiple investigations including CT scan of the neck, CT scan of the chest abdomen pelvis with no evidence of structural malignancy. Recent investigations including N56, folic acid, TSH, copper, methylmalonic acid levels were normal. Electrolytes including magnesium, phosphorus is normal.  Lupus antibody negative. Seen by neurology, do not think any spinal cord disease. ?Reconsidering subclinical seizure. ?Ongoing goal of care discussion depending on final outcome.  No definitive diagnosis yet. ? ?Core track tube removed for swallow evaluation on  4/3, patient alert awake and communicating in the  morning and again became lethargic.  Continue maintenance IV fluids. ? ?DVT prophylaxis: apixaban (ELIQUIS) tablet 2.5 mg Start: 03/20/22 2200 eliquis  ?apixaban (ELIQUIS) tablet 2.5 mg  ? ?Code Status: Full code. ?Family Communication: Patient's husband at the bedside.  Son and daughter at the bedside. ?Disposition Plan: Status is: Inpatient ?Remains inpatient appropriate because: Unable to eat. ?  ? ? ?Consultants:  ?Neurology ?Critical care ?Palliative care ? ?Procedures:  ?Multiple as above. ? ?Antimicrobials:  ?Completed antibiotics. ? ? ?Subjective: ? ?Patient seen and examined. ?I examined patient in the morning rounds with her husband at the bedside, she was sitting in chair, able to communicate with clear voice.  We did mouth care and gave her a spoon of water and she swallowed well.  Denied any complaints. ?Prepared to send for modified barium swallow, patient could not be woken up.  She followed simple commands especially able to extend hands but unable to keep arousable. ? ?Objective: ?Vitals:  ? 03/25/22 0139 03/25/22 0402 03/25/22 0751 03/25/22 1142  ?BP: 122/84 134/75 139/70 117/82  ?Pulse: 75 77 81 84  ?Resp: 17 18 18 15   ?Temp: 98.4 ?F (36.9 ?C) 98.7 ?F (37.1 ?C) 98 ?F (36.7 ?C) 97.6 ?F (36.4 ?C)  ?TempSrc: Axillary Oral Oral Axillary  ?SpO2: 97% 97% 97% 99%  ?Weight:  79.7 kg    ?Height:      ? ? ?Intake/Output Summary (Last 24 hours) at 03/25/2022 1233 ?Last data filed at 03/25/2022 0500 ?Gross per 24 hour  ?Intake 1173.6 ml  ?Output --  ?Net 1173.6 ml  ? ? ?Filed Weights  ? 03/20/22 0314 03/21/22 0500 03/25/22 0402  ?Weight: 78 kg 78.1 kg 79.7 kg  ? ? ?Examination: In the morning rounds. ? ?General: Frail and debilitated.  Sitting in chair.  Able to keep up some conversation. ?Cardiovascular: S1-S2 normal.  Regular rate rhythm.  Pacemaker in place. ?Respiratory: Bilateral clear.  Some conducted upper airway sounds.  On room air. ?Gastrointestinal: Soft.  Nontender.  Bowel sound present. ?Ext: No  swelling or edema.  No cyanosis.  No clubbing. ?Neuro: Alert and oriented.  Lethargic, generalized weakness but no focal deficits. ?Musculoskeletal: No deformities. ? ? ? ? ? ? ? ?Data Reviewed: I have personally reviewed following labs and imaging studies ? ?CBC: ?Recent Labs  ?Lab 03/19/22 ?0241 03/20/22 ?0304 03/22/22 ?0112 03/25/22 ?3500  ?WBC 16.7* 17.8* 15.0* 13.9*  ?NEUTROABS  --   --  13.2* 11.3*  ?HGB 11.3* 12.0 11.9* 12.3  ?HCT 33.8* 37.4 38.5 39.1  ?MCV 88.3 89.7 92.5 92.2  ?PLT 181 202 182 148*  ? ?Basic Metabolic Panel: ?Recent Labs  ?Lab 03/19/22 ?0241 03/20/22 ?0304 03/22/22 ?0112 03/25/22 ?9381  ?NA 142 139 140 138  ?K 4.8 4.8 4.4 4.4  ?CL 105 103 104 106  ?CO2 26 24 27 22   ?GLUCOSE 186* 182* 114* 86  ?BUN 70* 60* 61* 43*  ?CREATININE 1.31* 1.17* 1.20* 1.11*  ?CALCIUM 9.6 9.7 9.5 9.7  ?MG 1.9 1.8 1.7 1.7  ?PHOS 3.0 3.9 4.5 3.3  ? ?GFR: ?Estimated Creatinine Clearance: 39.5 mL/min (A) (by C-G formula based on SCr of 1.11 mg/dL (H)). ?Liver Function Tests: ?Recent Labs  ?Lab 03/25/22 ?8299  ?AST 26  ?ALT 53*  ?ALKPHOS 64  ?BILITOT 0.6  ?PROT 6.3*  ?ALBUMIN 2.5*  ? ? ?No results for input(s): LIPASE, AMYLASE in the last 168 hours. ?Recent Labs  ?Lab 03/25/22 ?1056  ?AMMONIA 17  ? ?  Coagulation Profile: ?No results for input(s): INR, PROTIME in the last 168 hours. ? ?Cardiac Enzymes: ?No results for input(s): CKTOTAL, CKMB, CKMBINDEX, TROPONINI in the last 168 hours. ? ?BNP (last 3 results) ?No results for input(s): PROBNP in the last 8760 hours. ?HbA1C: ?No results for input(s): HGBA1C in the last 72 hours. ?CBG: ?Recent Labs  ?Lab 03/24/22 ?2030 03/25/22 ?0101 03/25/22 ?0405 03/25/22 ?5844 03/25/22 ?1146  ?GLUCAP 103* 85 97 97 96  ? ?Lipid Profile: ?No results for input(s): CHOL, HDL, LDLCALC, TRIG, CHOLHDL, LDLDIRECT in the last 72 hours. ? ?Thyroid Function Tests: ?No results for input(s): TSH, T4TOTAL, FREET4, T3FREE, THYROIDAB in the last 72 hours. ?Anemia Panel: ?No results for input(s):  VITAMINB12, FOLATE, FERRITIN, TIBC, IRON, RETICCTPCT in the last 72 hours. ?Sepsis Labs: ?No results for input(s): PROCALCITON, LATICACIDVEN in the last 168 hours. ? ?Recent Results (from the past 240 hour(s))  ?Cu

## 2022-03-25 NOTE — Progress Notes (Signed)
EEG complete - results pending 

## 2022-03-25 NOTE — Progress Notes (Signed)
Nutrition Follow-up ? ?DOCUMENTATION CODES:  ? ?Severe malnutrition in context of acute illness/injury ? ?INTERVENTION:  ? ?Recommend PEG if fails MBS and within GOC: ?Tube Feeds:  ?Osmolite 1.5 @ 55 mL/hr (1320 mL/day) ?45 mL ProSource TF - BID ?Provides 2060 kcal, 105 gm protein, and 1006 mL free water daily. ? ?Recommend comfort feeds if PEG is not within GOC ? ?NUTRITION DIAGNOSIS:  ? ?Severe Malnutrition related to acute illness as evidenced by moderate muscle depletion, moderate fat depletion, percent weight loss. - Ongoing ? ?GOAL:  ? ?Patient will meet greater than or equal to 90% of their needs - Ongoing ? ?MONITOR:  ? ?Labs, Weight trends ? ?REASON FOR ASSESSMENT:  ? ?Malnutrition Screening Tool ?  ? ?ASSESSMENT:  ? ?81 y.o. presented to the ED with L eye swelling. Recent admission 3/2-3/5 for UTI and syncopal episode and 3/17-3/19 for FTT symptoms. PMH includes CHF, CKD III, malnutrition, and HTN. Pt admitted with eye swelling, rash, dysphagia, and AKI on CKD.   ? ?3/24 - transferred to ICU due to respiratory distress; Cortrak placed (tip placed) ?3/25 - intubated ?3/27 - Extubated  ?3/28 - Transferred to floor ?3/30 - MBS w/ SLP, recommend NPO  ?4/03 - Cortrak removed for MBS; MBS recommend NPO ? ?Pt with ongoing changes in mentation, often very lethargic. ?Plan for another MBS today in hopes that pt is more awake and alert.  ? ?Discussed with MD. Recommend that if pt does not pass MBS, than RD recommendations would be for a PEG to be placed if within GOC. If not within GOC, would recommend that SLP determine safest diet for comfort feeds. MD agreeable to RD recommendations.  ?Will leave TF recommendations if family wishes to continue tube feeds.  ? ?Medications reviewed and include: Pepcid, SSI 0-9 units q4h, Megace  ?Labs reviewed: BUN 43, Creatinine 1.11 ? ?Diet Order:   ?Diet Order   ? ?       ?  Diet NPO time specified Except for: Ice Chips  Diet effective now       ?  ? ?  ?  ? ?  ? ?EDUCATION  NEEDS:  ? ?Not appropriate for education at this time ? ?Skin:  Skin Assessment: Reviewed RN Assessment ? ?Last BM:  4/3 ? ?Height:  ?Ht Readings from Last 1 Encounters:  ?03/15/22 5\' 2"  (1.575 m)  ? ?Weight:  ?Wt Readings from Last 1 Encounters:  ?03/25/22 79.7 kg  ? ?Ideal Body Weight:  56.8 kg ? ?BMI:  Body mass index is 32.14 kg/m?. ? ?Estimated Nutritional Needs:  ?Kcal:  2000-2200 ?Protein:  100-115 grams ?Fluid:  >/= 2 L ? ? ? ?05/25/22 RD, LDN ?Clinical Dietitian ?See AMiON for contact information.  ? ?

## 2022-03-25 NOTE — Progress Notes (Signed)
Occupational Therapy Treatment ?Patient Details ?Name: Lauren Henry ?MRN: [place]:[id number] ?DOB: 11/03/1941 ?Today's Date: 03/25/2022 ? ? ?History of present illness 81 y.o female  presents to the ED via ambulance 3/23 with a chief complaint of right arm swelling and left eye swollen x a few days.   Allergic reaction?  PMH:   complete heart block, HTN, Afib on eliquis ?  ?OT comments ? Pt seen in conjunction with PT to progress functional skills safely. Pt wide awake, improving cognition and noted humor during interaction though slower processing time needed. Pt able to progress mobility to sink with Min A x 2 though increasing assist needed to stand with fatigue. Pt able to complete basic grooming tasks EOB with Setup and without LOB; able to assist with LB dressing tasks though Max A still required. Pt's friend at bedside and excited to see pt alertness improving this PM - hopeful for swallow study to be completed. Continue to recommend postacute rehab at DC.  ? ?Recommendations for follow up therapy are one component of a multi-disciplinary discharge planning process, led by the attending physician.  Recommendations may be updated based on patient status, additional functional criteria and insurance authorization. ?   ?Follow Up Recommendations ? Skilled nursing-short term rehab (<3 hours/day)  ?  ?Assistance Recommended at Discharge Frequent or constant Supervision/Assistance  ?Patient can return home with the following ? Two people to help with walking and/or transfers;Two people to help with bathing/dressing/bathroom ?  ?Equipment Recommendations ? Hospital bed  ?  ?Recommendations for Other Services   ? ?  ?Precautions / Restrictions Precautions ?Precautions: Fall;Other (comment) ?Precaution Comments: rectal tube, watch HR ?Restrictions ?Weight Bearing Restrictions: No  ? ? ?  ? ?Mobility Bed Mobility ?Overal bed mobility: Needs Assistance ?Bed Mobility: Supine to Sit, Sit to Supine ?Rolling: Min assist ?Sidelying to  sit: Min assist ?  ?Sit to supine: Min assist ?  ?General bed mobility comments: min assist to raise torso to sitting; return to supine min assist to raise each leg; returned to bed for EEG to be started ?  ? ?Transfers ?Overall transfer level: Needs assistance ?Equipment used: Rolling walker (2 wheels) ?Transfers: Sit to/from Stand ?Sit to Stand: Min assist, Mod assist, +2 physical assistance ?  ?  ?  ?  ?  ?General transfer comment: Stood from EOB x 2; from chair x1; initially needed slightly less boosting assist, but as fatigued required slightly more assist ?  ?  ?Balance Overall balance assessment: Needs assistance ?Sitting-balance support: No upper extremity supported, Feet supported ?Sitting balance-Leahy Scale: Fair ?Sitting balance - Comments: even able to keep balance while doing LE exercises (but wiht slight posterior lean and closeguarding) ?  ?Standing balance support: Reliant on assistive device for balance, Bilateral upper extremity supported, During functional activity ?Standing balance-Leahy Scale: Poor ?Standing balance comment: reliant on UE support for balance with RW ?  ?  ?  ?  ?  ?  ?  ?  ?  ?  ?  ?   ? ?ADL either performed or assessed with clinical judgement  ? ?ADL Overall ADL's : Needs assistance/impaired ?  ?  ?Grooming: Set up;Sitting;Wash/dry face;Brushing hair ?Grooming Details (indicate cue type and reason): able to sit EOB, wash face and wet hair with washcloth; combing with B hands without LOB ?  ?  ?  ?  ?  ?  ?Lower Body Dressing: Maximal assistance;Sit to/from stand ?Lower Body Dressing Details (indicate cue type and reason): able to doff socks  off of heels but DOE noted, assist for remainder of sock mgmt task ?  ?  ?  ?  ?  ?  ?Functional mobility during ADLs: Minimal assistance;+2 for physical assistance;+2 for safety/equipment;Rolling walker (2 wheels);Cueing for sequencing;Cueing for safety ?General ADL Comments: Pt alert, able to progress to mobility to sink with +2 assist.  Much more interactive with appropriate cognition with functional tasks ?  ? ?Extremity/Trunk Assessment Upper Extremity Assessment ?Upper Extremity Assessment: Generalized weakness ?  ?Lower Extremity Assessment ?Lower Extremity Assessment: Defer to PT evaluation ?  ?  ?  ? ?Vision   ?Vision Assessment?: No apparent visual deficits ?  ?Perception   ?  ?Praxis   ?  ? ?Cognition Arousal/Alertness: Awake/alert ?Behavior During Therapy: [facility] for tasks assessed/performed ?Overall Cognitive Status: Impaired/Different from baseline ?Area of Impairment: Following commands, Safety/judgement, Problem solving ?  ?  ?  ?  ?  ?  ?  ?  ?  ?  ?  ?Following Commands: Follows one step commands with increased time, Follows one step commands consistently ?  ?Awareness: Intellectual ?Problem Solving: Slow processing, Decreased initiation, Requires verbal cues, Requires tactile cues ?General Comments: alert, appropriate responses though delayed. showed some humor during session, follows directions consistently though benefits from multimodal cues ?  ?  ?   ?Exercises General Exercises - Lower Extremity ?Long Arc Quad: AROM, Left, Right, 10 reps, Seated ? ?  ?Shoulder Instructions   ? ? ?  ?General Comments Onalee Hua at bedside. HR up to 130s with activity  ? ? ?Pertinent Vitals/ Pain       Pain Assessment ?Pain Assessment: No/denies pain ?Faces Pain Scale: No hurt ?Breathing: normal ?Negative Vocalization: none ?Facial Expression: smiling or inexpressive ?Body Language: relaxed ?Consolability: no need to console ?PAINAD Score: 0 ?Pain Intervention(s): Monitored during session ? ?Home Living   ?  ?  ?  ?  ?  ?  ?  ?  ?  ?  ?  ?  ?  ?  ?  ?  ?  ?  ? ?  ?Prior Functioning/Environment    ?  ?  ?  ?   ? ?Frequency ? Min 2X/week  ? ? ? ? ?  ?Progress Toward Goals ? ?OT Goals(current goals can now be found in the care plan section) ? Progress towards OT goals: Progressing toward goals ? ?Acute Rehab OT Goals ?Patient Stated Goal: regain  strength, stay awake long enough for swallow study ?OT Goal Formulation: With patient/family ?Time For Goal Achievement: 04/01/22 ?Potential to Achieve Goals: Good ?ADL Goals ?Pt Will Perform Grooming: sitting;with min guard assist ?Pt Will Perform Lower Body Bathing: with supervision;sitting/lateral leans;sit to/from stand ?Pt Will Perform Upper Body Dressing: with min assist;sitting ?Pt Will Perform Lower Body Dressing: with mod assist;sit to/from stand;sitting/lateral leans ?Pt Will Transfer to Toilet: with mod assist;stand pivot transfer;bedside commode ?Pt Will Perform Toileting - Clothing Manipulation and hygiene: with modified independence;sitting/lateral leans;sit to/from stand  ?Plan Discharge plan needs to be updated   ? ?Co-evaluation ? ? ? PT/OT/SLP Co-Evaluation/Treatment: Yes ?Reason for Co-Treatment: Complexity of the patient's impairments (multi-system involvement);For patient/therapist safety;To address functional/ADL transfers ?PT goals addressed during session: Mobility/safety with mobility;Balance;Proper use of DME;Strengthening/ROM ?OT goals addressed during session: ADL's and self-care;Strengthening/ROM ?  ? ?  ?AM-PAC OT "6 Clicks" Daily Activity     ?Outcome Measure ? ? Help from another person eating meals?: A Little ?Help from another person taking care of personal grooming?: A Little ?Help from another  person toileting, which includes using toliet, bedpan, or urinal?: Total ?Help from another person bathing (including washing, rinsing, drying)?: A Lot ?Help from another person to put on and taking off regular upper body clothing?: A Lot ?Help from another person to put on and taking off regular lower body clothing?: A Lot ?6 Click Score: 13 ? ?  ?End of Session Equipment Utilized During Treatment: Rolling walker (2 wheels) ? ?OT Visit Diagnosis: Muscle weakness (generalized) (M62.81);Unsteadiness on feet (R26.81);Other abnormalities of gait and mobility (R26.89) ?  ?Activity Tolerance  Patient tolerated treatment well ?  ?Patient Left in bed;with call bell/phone within reach;with family/visitor present;with bed alarm set ?  ?Nurse Communication Mobility status ?  ? ?   ? ?Time: [id number]

## 2022-03-25 NOTE — Progress Notes (Addendum)
Subjective: ?Neurology called back to see for fluctuations in her level of consciousness.  ? ?Brief review of HPI: 81 y.o. female with a past medical history significant for complete heart block s/p pacer (MRI incompatible), paroxysmal atrial fibrillation on Eliquis, hypertension, hyperlipidemia, heart failure with preserved EF, CKD stage IIIb, hypothyroidism, recent UTI (3/05) who was hospitalized from 2/27-2/28 for dizziness and weakness in the setting of weeks of poor oral intake. Her husband reported progressive decline with at least a 30 pound weight loss since Christmas. She was discharged with plan for close primary care physician follow-up after improving with IV fluids and supportive care.   ? ?She was then readmitted on 3/2 for syncope in the setting of UTI and AKI, discharged 3/5 after treatment with IV Rocephin, transition to [facility] on discharge.  She continued to have very poor oral intake, and was started on prednisone for appetite, with the only reason she could offer for not eating being not liking the food.  Per husband, she took 4 prednisone pills a day for 5 days, which did improve her functional status as well as her appetite, but then she experienced a crash after stopping the prednisone on 3/15.  She was readmitted 3/17 - 3/19 for failure to thrive.  CT chest showed 8 mm noncalcified nodule in the right lower lobe with suggestion for repeat CT in 2 to 3 months.  Neurology evaluation noted bilateral lower extremity weakness and hyperreflexia, for which CT myelogram was recommended due to MRI incompatibility of her pacemaker.  Family deferred CT myelogram at that time.  Nutritional testing was also completed and revealed a normal copper level, normal thiamine and B12 as well as normal MMA, normal TSH. Vitamin E levels (alpha and gamma tocopherol) came back normal as well.  ?  ?Dr. Curly Shores saw the patient on 3/23 to assess for confusion after the patient was readmitted for a rash and left eye  swelling as well as difficulty breathing. Per Dr. Lyn Records note, the patient had been having some mild confusion such as getting her days and nights mixed up. Her husband felt that her slurred speech had been progressive over 1 week.  He noted no new medications other than B12 and thiamine.  She had been having some episodes of urinary incontinence but no bowel incontinence to their knowledge, however she did have an episode of bowel incontinence during Dr. Lyn Records examination.  They noted that she walked around more on 3/22 than she had in many days, and that her strength was actually quite good and improving compared to her hospitalization.  ? ?Also of note, patient had been having difficulty swallowing for which she had been on a clear liquid diet, but husband reported that she still would sometimes intermittently get choked up. Within roughly the same time period, the patient had experienced weight loss of over 30 pounds over the last 3 months.  No clear signs of malignancy were appreciated on CT imaging, but she was noted to have 8 mm noduleNoncalcified nodule of the right lower lobe, for which repeat CT scan recommended in 2-3 months.  In part some of her symptoms were thought possibly related with deconditioning. ? ?Neurology has been called back to see the patient regarding new change of fluctuating level of consciousness during this admission.  ? ?Objective: ?Current vital signs: ?BP 139/70 (BP Location: Left Arm)   Pulse 81   Temp 98 ?F (36.7 ?C) (Oral)   Resp 18   Ht 5\' 2"  (1.575 m)  Wt 79.7 kg   SpO2 97%   BMI 32.14 kg/m?  ?Vital signs in last 24 hours: ?Temp:  [97.3 ?F (36.3 ?C)-98.7 ?F (37.1 ?C)] 98 ?F (36.7 ?C) (04/04 0751) ?Pulse Rate:  [75-87] 81 (04/04 0751) ?Resp:  [16-18] 18 (04/04 0751) ?BP: (105-139)/(70-102) 139/70 (04/04 0751) ?SpO2:  [89 %-98 %] 97 % (04/04 0751) ?Weight:  [79.7 kg] 79.7 kg (04/04 0402) ? ?Intake/Output from previous day: ?04/03 0701 - 04/04 0700 ?In: 1173.6  [I.V.:1173.6] ?Out: -  ?Intake/Output this shift: ?No intake/output data recorded. ?Nutritional status:  ?Diet Order   ? ?       ?  Diet NPO time specified Except for: Ice Chips  Diet effective now       ?  ? ?  ?  ? ?  ? ? ?Neurologic Exam: ?Mental Status: Drowsy.  ?Orientation: Oriented to person, place, time, and situation  ?Attention/Concentration: Impaired due to encephalopathy/drowsiness ?Fund of Knowledge: Intact ?Language: Intact fluency, Intact comprehension, Intact repetition, and Intact naming ?Speech: Fluent, Without Dysarthria, and Normal Prosody   ? ?Cranial Nerves:  ?Pupils: Equal and Reactive  ?Visual Fields: Blink to Threat grossly intact B/L ?Optic Disc: Poorly visualized  ?CN III, IV, VI (Extra-Ocular Movements): EOMI, No nystagmus, and Normal saccades ?CN V: Normal sensation in V1, V2, V3 bilaterally ?CN VII: Normal, symmetric facial muscle strength bilaterally ?CN VIII: Auditory acuity intact to bedside testing ?CN IX/X: Normal palate elevation ?CN XI: Normal strength of bilateral trapezius and SCM muscles  ?CN XII: Full strength B/L with tongue-in-cheek testing ? ?Motor:  ?Strength: 5/5 x 4 extremities ?Bulk:: Age-Appropriate atrophy  ?Tone: Normal in the upper and lower extremities ?Involuntary Movements (asterixis, tremor, etc): Absent  ?Pronator Drift: Absent ? ?Sensory: ?Light Touch: Intact in all 4 extremities  ?No extinction to double simultaneous stimulation   ? ?Reflexes:  ?                            Right     Left  ?Biceps                              2/4                2/4 ?Triceps        2/4                 2/4 ?Brachioradialis       2/4                 2/4 ?Patella        2/4                 2/4 ?Achilles        2/4                 2/4 ?Plantar Response  Downgoing  Downgoing  ? ?Coordination: ?No ataxia noted ? ?Gait: ?Not assessed but son says she can walk alone when she is awake and alert.    ? ?Lab Results: ?Results for orders placed or performed during the hospital encounter of  03/13/22 (from the past 48 hour(s))  ?Glucose, capillary     Status: Abnormal  ? Collection Time: 03/23/22 12:13 PM  ?Result Value Ref Range  ? Glucose-Capillary 133 (H) 70 - 99 mg/dL  ?  Comment: Glucose reference range applies only to samples taken after fasting for  at least 8 hours.  ?Glucose, capillary     Status: Abnormal  ? Collection Time: 03/23/22  4:02 PM  ?Result Value Ref Range  ? Glucose-Capillary 123 (H) 70 - 99 mg/dL  ?  Comment: Glucose reference range applies only to samples taken after fasting for at least 8 hours.  ?Glucose, capillary     Status: Abnormal  ? Collection Time: 03/23/22  8:07 PM  ?Result Value Ref Range  ? Glucose-Capillary 117 (H) 70 - 99 mg/dL  ?  Comment: Glucose reference range applies only to samples taken after fasting for at least 8 hours.  ?Glucose, capillary     Status: Abnormal  ? Collection Time: 03/23/22 11:58 PM  ?Result Value Ref Range  ? Glucose-Capillary 117 (H) 70 - 99 mg/dL  ?  Comment: Glucose reference range applies only to samples taken after fasting for at least 8 hours.  ?Glucose, capillary     Status: Abnormal  ? Collection Time: 03/24/22  3:54 AM  ?Result Value Ref Range  ? Glucose-Capillary 116 (H) 70 - 99 mg/dL  ?  Comment: Glucose reference range applies only to samples taken after fasting for at least 8 hours.  ?Glucose, capillary     Status: Abnormal  ? Collection Time: 03/24/22  7:57 AM  ?Result Value Ref Range  ? Glucose-Capillary 135 (H) 70 - 99 mg/dL  ?  Comment: Glucose reference range applies only to samples taken after fasting for at least 8 hours.  ?Glucose, capillary     Status: Abnormal  ? Collection Time: 03/24/22 12:17 PM  ?Result Value Ref Range  ? Glucose-Capillary 118 (H) 70 - 99 mg/dL  ?  Comment: Glucose reference range applies only to samples taken after fasting for at least 8 hours.  ?Glucose, capillary     Status: None  ? Collection Time: 03/24/22  4:38 PM  ?Result Value Ref Range  ? Glucose-Capillary 73 70 - 99 mg/dL  ?  Comment:  Glucose reference range applies only to samples taken after fasting for at least 8 hours.  ?Glucose, capillary     Status: Abnormal  ? Collection Time: 03/24/22  8:30 PM  ?Result Value Ref Range  ? Glucose-Capillary 103 (

## 2022-03-25 NOTE — Progress Notes (Signed)
Patient unavailable for EEG.  Rocky Link, RN, will call EEG when she returns from CT and we will re-attempt. ?

## 2022-03-25 NOTE — Procedures (Signed)
Patient Name: Lauren Henry  ?MRN: [id number]  ?Epilepsy Attending: Charlsie Quest  ?Referring Physician/Provider: Dorcas Carrow, MD ?Date: 03/25/2022 ?Duration: 21.56 mins ? ?Patient history: 81yo F with ams. EEG to evaluate for seizure. ? ?Level of alertness: Awake ? ?AEDs during EEG study: None ? ?Technical aspects: This EEG study was done with scalp electrodes positioned according to the 10-20 International system of electrode placement. Electrical activity was acquired at a sampling rate of 500Hz  and reviewed with a high frequency filter of 70Hz  and a low frequency filter of 1Hz . EEG data were recorded continuously and digitally stored.  ? ?Description: The posterior dominant rhythm consists of 7.5 activity of moderate voltage (25-35 uV) seen predominantly in posterior head regions, symmetric and reactive to eye opening and eye closing.  EEG showed continuous generalized 5 to 7 Hz theta slowing admixed with intermittent generalized 2 to 3 Hz delta slowing.  Hyperventilation and photic stimulation were not performed.    ? ?ABNORMALITY ?- Continuous slow, generalized ? ?IMPRESSION: ?This study is suggestive of mild to moderate diffuse encephalopathy, nonspecific etiology. No seizures or epileptiform discharges were seen throughout the recording. ? ? ?Lauren Henry  ? ?

## 2022-03-25 NOTE — Progress Notes (Signed)
Physical Therapy Treatment ?Patient Details ?Name: Lauren Henry ?MRN: [id number] ?DOB: 01-20-1941 ?Today's Date: 03/25/2022 ? ? ?History of Present Illness 81 y.o female  presents to the ED via ambulance 3/23 with a chief complaint of right arm swelling and left eye swollen x a few days.   Allergic reaction?  PMH:   complete heart block, HTN, Afib on eliquis ? ?  ?PT Comments  ? ? Patient WIDE awake on arrival. Able to get to EOB with min assist and progressed to walking 5 ft with +2 min assist! HR max 130s. Due to pt's continued fluctuating level of arousal, continue to feel SNF most appropriate rehab venue. Will continue to monitor and update if indicated.  ?   ?Recommendations for follow up therapy are one component of a multi-disciplinary discharge planning process, led by the attending physician.  Recommendations may be updated based on patient status, additional functional criteria and insurance authorization. ? ?Follow Up Recommendations ? Skilled nursing-short term rehab (<3 hours/day) ?  ?  ?Assistance Recommended at Discharge Frequent or constant Supervision/Assistance  ?Patient can return home with the following Assistance with cooking/housework;Assist for transportation;Help with stairs or ramp for entrance;Two people to help with walking and/or transfers;Two people to help with bathing/dressing/bathroom;Direct supervision/assist for medications management;Direct supervision/assist for financial management ?  ?Equipment Recommendations ? Wheelchair (measurements PT);Wheelchair cushion (measurements PT);Hospital bed  ?  ?Recommendations for Other Services   ? ? ?  ?Precautions / Restrictions Precautions ?Precautions: Fall;Other (comment) ?Precaution Comments: rectal tube, watch HR ?Restrictions ?Weight Bearing Restrictions: No  ?  ? ?Mobility ? Bed Mobility ?Overal bed mobility: Needs Assistance ?Bed Mobility: Supine to Sit, Sit to Supine ?Rolling: Min assist ?Sidelying to sit: Min assist ?  ?Sit to supine:  Min assist ?  ?General bed mobility comments: min assist to raise torso to sitting; return to supine min assist to raise each leg; returned to bed for EEG to be started ?  ? ?Transfers ?Overall transfer level: Needs assistance ?Equipment used: Rolling walker (2 wheels) ?Transfers: Sit to/from Stand ?Sit to Stand: Min assist, Mod assist, +2 physical assistance ?  ?  ?  ?  ?  ?General transfer comment: Stood from EOB x 2; from chair x1; initially needed slightly less boosting assist, but as fatigued required slightly more assist ?  ? ?Ambulation/Gait ?Ambulation/Gait assistance: Min assist, +2 physical assistance, +2 safety/equipment ?Gait Distance (Feet): 5 Feet (seated rest, 5) ?Assistive device: Rolling walker (2 wheels) ?Gait Pattern/deviations: Step-through pattern, Decreased stride length ?Gait velocity: reduced ?  ?  ?General Gait Details: slow, steady with RW; cues for upright posture ? ? ?Stairs ?  ?  ?  ?  ?  ? ? ?Wheelchair Mobility ?  ? ?Modified Rankin (Stroke Patients Only) ?  ? ? ?  ?Balance Overall balance assessment: Needs assistance ?Sitting-balance support: No upper extremity supported, Feet supported ?Sitting balance-Leahy Scale: Fair ?Sitting balance - Comments: even able to keep balance while doing LE exercises (but wiht slight posterior lean and closeguarding) ?  ?Standing balance support: Reliant on assistive device for balance, Bilateral upper extremity supported, During functional activity ?Standing balance-Leahy Scale: Poor ?Standing balance comment: reliant on UE support for balance with RW ?  ?  ?  ?  ?  ?  ?  ?  ?  ?  ?  ?  ? ?  ?Cognition Arousal/Alertness: Awake/alert ?Behavior During Therapy: [facility] for tasks assessed/performed ?Overall Cognitive Status: Impaired/Different from baseline ?Area of Impairment: Following commands, Safety/judgement, Problem solving ?  ?  ?  ?  ?  ?  ?  ?  ?  ?  ?  ?  Following Commands: Follows one step commands with increased time, Follows one step commands  consistently ?  ?Awareness: Intellectual ?Problem Solving: Slow processing, Decreased initiation, Requires verbal cues, Requires tactile cues ?  ?  ?  ? ?  ?Exercises General Exercises - Lower Extremity ?Long Arc Quad: AROM, Left, Right, 10 reps, Seated ? ?  ?General Comments   ?  ?  ? ?Pertinent Vitals/Pain Pain Assessment ?Pain Assessment: No/denies pain ?Faces Pain Scale: No hurt ?Breathing: normal ?Negative Vocalization: none ?Facial Expression: smiling or inexpressive ?Body Language: relaxed ?Consolability: no need to console ?PAINAD Score: 0  ? ? ?Home Living   ?  ?  ?  ?  ?  ?  ?  ?  ?  ?   ?  ?Prior Function    ?  ?  ?   ? ?PT Goals (current goals can now be found in the care plan section) Acute Rehab PT Goals ?Patient Stated Goal: not stated ?Time For Goal Achievement: 03/28/22 ?Potential to Achieve Goals: Good ?Progress towards PT goals: Progressing toward goals ? ?  ?Frequency ? ? ? Min 3X/week ? ? ? ?  ?PT Plan Current plan remains appropriate  ? ? ?Co-evaluation PT/OT/SLP Co-Evaluation/Treatment: Yes ?Reason for Co-Treatment: Complexity of the patient's impairments (multi-system involvement);For patient/therapist safety;To address functional/ADL transfers ?PT goals addressed during session: Mobility/safety with mobility;Balance;Proper use of DME;Strengthening/ROM ?  ?  ? ?  ?AM-PAC PT "6 Clicks" Mobility   ?Outcome Measure ? Help needed turning from your back to your side while in a flat bed without using bedrails?: A Little ?Help needed moving from lying on your back to sitting on the side of a flat bed without using bedrails?: A Little ?Help needed moving to and from a bed to a chair (including a wheelchair)?: Total ?Help needed standing up from a chair using your arms (e.g., wheelchair or bedside chair)?: Total ?Help needed to walk in hospital room?: Total ?Help needed climbing 3-5 steps with a railing? : Total ?6 Click Score: 10 ? ?  ?End of Session   ?Activity Tolerance: Patient tolerated treatment  well ?Patient left: in bed;with call bell/phone within reach;with family/visitor present;with bed alarm set ?Nurse Communication: Mobility status ?PT Visit Diagnosis: Muscle weakness (generalized) (M62.81);Other abnormalities of gait and mobility (R26.89) ?  ? ? ?Time: [id number] ?PT Time Calculation (min) (ACUTE ONLY): 31 min ? ?Charges:  $Gait Training: 8-22 mins          ?          ? ? ?Arby Barrette, PT ?Acute Rehabilitation Services  ?Pager [phone] ?Office [phone] ? ? ? ?Jeanie Cooks Kvon Mcilhenny ?03/25/2022, 1:47 PM ? ?

## 2022-03-26 DIAGNOSIS — R4182 Altered mental status, unspecified: Secondary | ICD-10-CM | POA: Diagnosis not present

## 2022-03-26 DIAGNOSIS — Z7189 Other specified counseling: Secondary | ICD-10-CM | POA: Diagnosis not present

## 2022-03-26 DIAGNOSIS — Z515 Encounter for palliative care: Secondary | ICD-10-CM | POA: Diagnosis not present

## 2022-03-26 DIAGNOSIS — R410 Disorientation, unspecified: Secondary | ICD-10-CM

## 2022-03-26 DIAGNOSIS — R627 Adult failure to thrive: Secondary | ICD-10-CM

## 2022-03-26 DIAGNOSIS — H5789 Other specified disorders of eye and adnexa: Secondary | ICD-10-CM | POA: Diagnosis not present

## 2022-03-26 LAB — GLUCOSE, CAPILLARY
Glucose-Capillary: 101 mg/dL — ABNORMAL HIGH (ref 70–99)
Glucose-Capillary: 104 mg/dL — ABNORMAL HIGH (ref 70–99)
Glucose-Capillary: 112 mg/dL — ABNORMAL HIGH (ref 70–99)
Glucose-Capillary: 129 mg/dL — ABNORMAL HIGH (ref 70–99)
Glucose-Capillary: 164 mg/dL — ABNORMAL HIGH (ref 70–99)
Glucose-Capillary: 66 mg/dL — ABNORMAL LOW (ref 70–99)
Glucose-Capillary: 86 mg/dL (ref 70–99)
Glucose-Capillary: 88 mg/dL (ref 70–99)
Glucose-Capillary: 91 mg/dL (ref 70–99)
Glucose-Capillary: 95 mg/dL (ref 70–99)

## 2022-03-26 MED ORDER — DEXTROSE 50 % IV SOLN
INTRAVENOUS | Status: AC
  Administered 2022-03-26: 12.5 g via INTRAVENOUS
  Filled 2022-03-26: qty 50

## 2022-03-26 MED ORDER — APIXABAN 2.5 MG PO TABS
2.5000 mg | ORAL_TABLET | Freq: Two times a day (BID) | ORAL | Status: DC
  Administered 2022-03-26 – 2022-03-28 (×4): 2.5 mg via ORAL
  Filled 2022-03-26 (×4): qty 1

## 2022-03-26 MED ORDER — ACETAMINOPHEN 160 MG/5ML PO SOLN
650.0000 mg | Freq: Four times a day (QID) | ORAL | Status: DC | PRN
  Administered 2022-03-31: 650 mg via ORAL
  Filled 2022-03-26: qty 20.3

## 2022-03-26 MED ORDER — DEXTROSE IN LACTATED RINGERS 5 % IV SOLN: INTRAVENOUS | Status: DC

## 2022-03-26 MED ORDER — MEGESTROL ACETATE 400 MG/10ML PO SUSP: 400.0000 mg | Freq: Every day | ORAL | Status: DC

## 2022-03-26 MED ORDER — LEVOTHYROXINE SODIUM 75 MCG PO TABS
150.0000 ug | ORAL_TABLET | ORAL | Status: DC
  Administered 2022-03-27 – 2022-03-31 (×4): 150 ug via ORAL
  Filled 2022-03-26 (×4): qty 2

## 2022-03-26 MED ORDER — FAMOTIDINE 20 MG PO TABS
20.0000 mg | ORAL_TABLET | Freq: Every day | ORAL | Status: DC
  Administered 2022-03-27 – 2022-03-31 (×5): 20 mg via ORAL
  Filled 2022-03-26 (×6): qty 1

## 2022-03-26 MED ORDER — METOPROLOL TARTRATE 12.5 MG HALF TABLET
12.5000 mg | ORAL_TABLET | Freq: Two times a day (BID) | ORAL | Status: DC
  Administered 2022-03-26: 12.5 mg via ORAL
  Filled 2022-03-26: qty 1

## 2022-03-26 MED ORDER — LEVOTHYROXINE SODIUM 75 MCG PO TABS
225.0000 ug | ORAL_TABLET | ORAL | Status: DC
  Administered 2022-03-29: 225 ug via ORAL
  Filled 2022-03-26: qty 3

## 2022-03-26 MED ORDER — DEXTROSE 50 % IV SOLN: 12.5000 g | INTRAVENOUS | Status: AC

## 2022-03-26 MED ORDER — ACETAMINOPHEN 650 MG RE SUPP: 650.0000 mg | Freq: Four times a day (QID) | RECTAL | Status: DC | PRN

## 2022-03-26 NOTE — Procedures (Addendum)
Patient Name: Lauren Henry  ?MRN: [id number]  ?Epilepsy Attending: Charlsie Quest  ?Referring Physician/Provider: Caryl Pina, MD ?Duration: 03/25/2022 1451 to 03/26/2022 1451 ?  ?Patient history: 81yo F with ams. EEG to evaluate for seizure. ?  ?Level of alertness: Awake, asleep ?  ?AEDs during EEG study: None ?  ?Technical aspects: This EEG study was done with scalp electrodes positioned according to the 10-20 International system of electrode placement. Electrical activity was acquired at a sampling rate of 500Hz  and reviewed with a high frequency filter of 70Hz  and a low frequency filter of 1Hz . EEG data were recorded continuously and digitally stored.  ?  ?Description: The posterior dominant rhythm consists of 7.5 activity of moderate voltage (25-35 uV) seen predominantly in posterior head regions, symmetric and reactive to eye opening and eye closing.  Sleep was characterized by vertex waves, sleep spindles (12-14), maximum frontocentral region.   EEG showed continuous generalized 5 to 7 Hz theta slowing admixed with intermittent generalized 2 to 3 Hz delta slowing.  Hyperventilation and photic stimulation were not performed.    ? ?Patient event button was pressed on 03/25/2022 at 1747 during which patient was noted to be looking around, not responding to questions.  Concomitant EEG before, during and after the event showed normal posterior dominant rhythm. ? ?Patient event button was pressed on 03/26/2022 at 0915 for unclear reasons..  Concomitant EEG before, during and after the event showed normal posterior dominant rhythm. ?  ?ABNORMALITY ?- Continuous slow, generalized ?  ?IMPRESSION: ?This study is suggestive of mild diffuse encephalopathy, nonspecific etiology. No seizures or epileptiform discharges were seen throughout the recording. ? ?One event was recorded on 03/25/2022 at 1747 during which patient was looking around, not responding without concomitant EEG change.  This was not an epileptic event. ? ?One  event was recorded on 03/26/2022 at 0915. Event button was pressed for unclear reasons. Concomitant EEG did not show any change to suggest seizure.  This was not an epileptic event. ?  ?  ?[name] 05/26/2022  ?

## 2022-03-26 NOTE — Progress Notes (Signed)
LTM maint complete 

## 2022-03-26 NOTE — Progress Notes (Signed)
Speech Language Pathology Treatment: Dysphagia  ?Patient Details ?Name: Lauren Henry ?MRN: [id number] ?DOB: 17-Mar-1941 ?Today's Date: 03/26/2022 ?Time: 0920-1005 ?SLP Time Calculation (min) (ACUTE ONLY): 45 min ? ?Assessment / Plan / Recommendation ?Clinical Impression ? Pt seen at bedside for follow up after instrumental assessment completed 03/25/22. Pt was seated upright in recliner. Husband present. Pt was fully awake, conversant, pleasant and cooperative. She was drinking thin liquids via straw upon arrival of SLP. Pt reported continued abdominal discomfort. ? ?Pt accepted trials of thin liquids and puree textures. Oral prep and clearing appeared timely, without residue. No cough noted following PO trials. Recommend beginning PO diet of puree and nectar thick liquids with water protocol between meals.  ? ?Consulted with MD, RN, and SLP who has been following pt more closely. SLP will continue to follow to assess diet tolerance, and determine readiness to advance textures or repeat MBS. ?  ?HPI HPI: Patient is an 81 y.o. female with PMH: afib, complete heart block s/p pacemaker (not MRI compatable), nonobstructive CAD, CKD stage III who presented to the hospital on 3/23 with left eye swelling. Patient had been recently hospitalized from 3/2 to 3/5 after having syncopal episode in setting of UTI where she was taken off blood pressure medications and treated with antibiotics. She was then hospitalized 3/17-3/19 for FTT symptoms with her husband reporting a progressive decline and at least 30lb weight loss since end of December. There was concern for possible underlying malignany but CT chest, abdomen and pelvis did not show any acute cause for her symptoms. Scans noted an 8 mm noncalcified nodule of the right lower lobe for which repeat CT scan recommended in 2 to 3 months.  She was also noted to have new tremor that was not thought to be parkinsonian in nature with mildly elevated free T4, but levothyroxine dose was  deferred to PCP. In ED, patient was afebrile and vital signs relatively stable, CXR showed no acute disease. On 3/24 RN called into patient's room by husband due to concerns of trouble breathing. Rapid Response was called and patient started on 2L [place] and transfered to unit 61M. on 3/25 patient with increased WOB and CXR showing interstitial changes, possible aspiration. She was intubated at [place] on 3/25 and extubated to 4L via [place] on 3/27 at 1018. ?  ?   ?SLP Plan ? Continue with current plan of care ? ?  ?  ?Recommendations for follow up therapy are one component of a multi-disciplinary discharge planning process, led by the attending physician.  Recommendations may be updated based on patient status, additional functional criteria and insurance authorization. ?  ? ?Recommendations  ?Diet recommendations: Dysphagia 1 (puree);Nectar-thick liquid;Other(comment) (water protocol between meals) ?Liquids provided via: Cup;Straw ?Medication Administration: Whole meds with puree ?Supervision: Patient able to self feed;Staff to assist with self feeding;Full supervision/cueing for compensatory strategies ?Compensations: Chin tuck;Minimize environmental distractions;Slow rate;Small sips/bites ?Postural Changes and/or Swallow Maneuvers: Seated upright 90 degrees  ?   ?    ?   ? ? ? ? Oral Care Recommendations: Oral care QID;Staff/trained caregiver to provide oral care ?Follow Up Recommendations: Other (comment) (TBD) ?Assistance recommended at discharge: Frequent or constant Supervision/Assistance ?SLP Visit Diagnosis: Dysphagia, unspecified (R13.10) ?Plan: Continue with current plan of care ? ? ? ? ?  ? Saya Mccoll B. Shailene Demonbreun, MSP, CCC-SLP ?Speech Language Pathologist ?Office: [phone] ? ?Shonna Chock ?03/26/2022, 11:28 AM ?

## 2022-03-26 NOTE — Progress Notes (Signed)
Speech Language Pathology Treatment: Dysphagia  ?Patient Details ?Name: Lauren Henry ?MRN: [id number] ?DOB: 11/04/1941 ?Today's Date: 03/26/2022 ?Time: 1205-1230 ?SLP Time Calculation (min) (ACUTE ONLY): 25 min ? ?Assessment / Plan / Recommendation ?Clinical Impression ? Pt/friend were seen for education regarding diet recommendations and safe swallow precautions. Pt still sitting upright in recliner, awake and looking at a magazine. SLP explained rationale for nectar thick liquids rather than thin liquids, given variable mentation and performance on MBS. Puree diet recommended for energy conservation and to maximize safe intake. Pt/friend were receptive to education regarding the need for chin tuck with liquids. Pt was encouraged to keep straw straight to facilitate chin tuck when drinking. She may also drink from the side of the cup, but needs to remember to tuck chin before the swallow. Pt may also have sips of water between meals, if alert and upright, immediately following oral care, 30 minutes after other intake. Written information was posted at [facility] after SLP provided education. SLP will continue to follow to assess tolerance of current diet, and readiness to advance textures or repeat MBS. Opportunity was provided to ask questions.  ?  ?HPI HPI: Patient is an 81 y.o. female with PMH: afib, complete heart block s/p pacemaker (not MRI compatable), nonobstructive CAD, CKD stage III who presented to the hospital on 3/23 with left eye swelling. Patient had been recently hospitalized from 3/2 to 3/5 after having syncopal episode in setting of UTI where she was taken off blood pressure medications and treated with antibiotics. She was then hospitalized 3/17-3/19 for FTT symptoms with her husband reporting a progressive decline and at least 30lb weight loss since end of December. There was concern for possible underlying malignany but CT chest, abdomen and pelvis did not show any acute cause for her symptoms. Scans  noted an 8 mm noncalcified nodule of the right lower lobe for which repeat CT scan recommended in 2 to 3 months.  She was also noted to have new tremor that was not thought to be parkinsonian in nature with mildly elevated free T4, but levothyroxine dose was deferred to PCP. In ED, patient was afebrile and vital signs relatively stable, CXR showed no acute disease. On 3/24 RN called into patient's room by husband due to concerns of trouble breathing. Rapid Response was called and patient started on 2L [place] and transfered to unit 72M. on 3/25 patient with increased WOB and CXR showing interstitial changes, possible aspiration. She was intubated at [place] on 3/25 and extubated to 4L via  on 3/27 at 1018. ?  ?   ?SLP Plan ? Continue with current plan of care ? ?  ?  ?Recommendations for follow up therapy are one component of a multi-disciplinary discharge planning process, led by the attending physician.  Recommendations may be updated based on patient status, additional functional criteria and insurance authorization. ?  ? ?Recommendations  ?Diet recommendations: Dysphagia 1 (puree);Nectar-thick liquid ?Liquids provided via: Cup;Straw ?Medication Administration: Whole meds with puree ?Supervision: Patient able to self feed;Staff to assist with self feeding;Full supervision/cueing for compensatory strategies ?Compensations: Chin tuck;Minimize environmental distractions;Slow rate;Small sips/bites ?Postural Changes and/or Swallow Maneuvers: Seated upright 90 degrees  ?   ?    ?   ? ? ? ? Oral Care Recommendations: Oral care QID;Staff/trained caregiver to provide oral care ?Follow Up Recommendations: Other (comment) (tbd) ?Assistance recommended at discharge: Frequent or constant Supervision/Assistance ?SLP Visit Diagnosis: Dysphagia, unspecified (R13.10) ?Plan: Continue with current plan of care ? ? ? ? ?  ?  ?  Jahnya Trindade B. Santa Abdelrahman, MSP, CCC-SLP ?Speech Language Pathologist ?Office: [phone] ? ?Shonna Chock ?03/26/2022,  12:32 PM ?

## 2022-03-26 NOTE — Progress Notes (Signed)
?PROGRESS NOTE ? ? ? ?Lauren Henry  ZOX:[id number] DOB: [birth year] DOA: 03/13/2022 ?PCP: Haywood Pao, MD  ? ? ?Brief Narrative:  ?81 year old female with history of PAF, complete heart block status post pacemaker , nonobstructive CAD, diastolic dysfunction, hypothyroidism, chronic kidney disease stage IIIb, recent multiple hospitalization presented to the ER on 3/23 with left eye swelling for 1 day. ? ?3/2-3/5, admitted to the hospital with syncopal episode in the setting of UTI. ?3/17-3/19, failure to thrive symptoms.  CT scan chest abdomen pelvis negative for malignancy.  Given prednisone on discharge for appetite stimulation. ?3/23, presented back to the hospital with left eye swelling, more slurred speech, neck swelling with difficulty swallowing and intermittent coughing while eating.  Patient noted to be breaking into rashes and progressive swelling of the left eye and difficulty breathing. ?At the emergency room afebrile with stable vital signs.  Creatinine 2.05.  Mildly elevated AST and ALT.  Chest x-ray was normal.  She was given 500 cc of normal saline and admitted to the hospital. ? ?3/24, she was on the medical floor.  Facial swelling worsened and she started having tongue swelling along with tachypnea and difficulty breathing.  She was found with edematous tongue, started on high-dose steroids ,intubated and admitted to the intensive care unit. ? ?Remains in the hospital.  Fluctuating mental status.  Dramatic changes in mental status.  Poor oral intake. ? ? ? ?Assessment & Plan: ?  ?Acute metabolic encephalopathy: ?Multiple head CT scans were without any abnormal findings.  Pacemaker incompatible to brain MRI. ?Patient started demonstrating drastic/dramatic changes in mental status over time. ?Ammonia is normal. ?ABG with no CO2 retention. ?Repeat CT head without acute findings. ?Long-term EEG with no evidence of epileptiform discharges. ?Some clinical improvement today. ? ?Angioedema rash ,  conjunctival ecchymosis, neck swelling and edema:  ?Unknown cause.  Responded well to high-dose steroids.  IgA levels within normal limit.  Group A Streptococcus negative.  Neck swelling improving.  CT scan with no complications. ?Completed steroids.  Continue Pepcid.  Inflammation work-up mostly negative.  ESR and CRP trending down. Skin biopsy right arm 3/25, results are pending.   ?Rashes have improved and resolved. ? ?Acute hypoxemic respiratory failure secondary to above: Intubated to protect airway.  Extubated 3/27 and currently mostly on room air. Treated with Rocephin for presumed aspiration.  Completed antibiotic therapy.   ? ?Dysphagia: Today able to tolerate pur?ed diet with nectar thick liquids. ? ?Anemia of acute blood loss: Received 1 unit of PRBC with appropriate response.  Hemoglobin is stable since initial transfusion. ? ?AKI on CKD stage IIIb: At about her usual levels. ? ?Paroxysmal A-fib, complete heart block status post pacemaker: Sinus rhythm.  Rate controlled.  On metoprolol.  Therapeutic on Eliquis.   ? ?Hypothyroidism: Synthroid replacement on high doses.  Continue. ? ?Hypoglycemia: Due to prolonged n.p.o. status.  Discontinue all insulin products.  No restriction of salt and carbohydrate. ? ?Goal of care ,progressive weakness, generalized weakness, failure to thrive: ?Patient with progressive failure to thrive, poor appetite for at least 3 months now. ?Multiple investigations including CT scan of the neck, CT scan of the chest abdomen pelvis with no evidence of structural malignancy. Recent investigations including W11, folic acid, TSH, copper, methylmalonic acid levels were normal. Electrolytes including magnesium, phosphorus is normal.  Lupus antibody negative. Seen by neurology, do not think any spinal cord disease. ?Reconsidering subclinical seizure. ?Ongoing goal of care discussion depending on final outcome.  No definitive diagnosis yet. ? ?4/5 ,  will attempt feeding.  Anticipate home  with family support and will suggest home hospice if she can have some oral intake. ? ?DVT prophylaxis: apixaban (ELIQUIS) tablet 2.5 mg Start: 03/26/22 2200 eliquis  ?apixaban (ELIQUIS) tablet 2.5 mg  ? ?Code Status: Full code. ?Family Communication: Patient's husband and son at the bedside. ?Disposition Plan: Status is: Inpatient ?Remains inpatient appropriate because: Unable to eat. ?  ? ? ?Consultants:  ?Neurology ?Critical care ?Palliative care ? ?Procedures:  ?Multiple as above. ? ?Antimicrobials:  ?Completed antibiotics. ? ? ?Subjective: ?Patient seen and examined.  Husband and son at the bedside.  She was more alert today.  She was able to follow commands.  She was able to voice concerns but overall weak. ?In the morning rounds, I gave her Magic mouthwash and she was able to swish and spit as instructed. ?Overnight events noted.  Patient had hypoglycemic episodes corrected with dextrose. ?Later today, patient was able to swallow pur?ed diet. ? ? ?Objective: ?Vitals:  ? 03/26/22 0000 03/26/22 0400 03/26/22 0809 03/26/22 1230  ?BP: 127/84 (!) 126/54 97/64 114/75  ?Pulse: 81 89 99 90  ?Resp: 16 19 20 14   ?Temp:  98.2 ?F (36.8 ?C) 97.9 ?F (36.6 ?C)   ?TempSrc:  Axillary Axillary   ?SpO2: 98% 100% 96% 98%  ?Weight:  78 kg    ?Height:      ? ? ?Intake/Output Summary (Last 24 hours) at 03/26/2022 1251 ?Last data filed at 03/26/2022 5830 ?Gross per 24 hour  ?Intake 0 ml  ?Output 250 ml  ?Net -250 ml  ? ? ?Filed Weights  ? 03/21/22 0500 03/25/22 0402 03/26/22 0400  ?Weight: 78.1 kg 79.7 kg 78 kg  ? ? ?Examination: In the morning rounds. ? ?General: Frail and debilitated.  Sitting in chair.  More alert today. ?Mouth is still with some white flecks. ?Cardiovascular: S1-S2 normal.  Regular rate rhythm.  Pacemaker in place. ?Respiratory: Bilateral clear.  Some conducted upper airway sounds.  On room air. ?Gastrointestinal: Soft.  Nontender.  Bowel sound present. ?Ext: No swelling or edema.  No cyanosis.  No clubbing. ?Neuro:  Alert and oriented. generalized weakness but no focal deficits. ?Musculoskeletal: No deformities. ? ? ? ? ? ? ? ?Data Reviewed: I have personally reviewed following labs and imaging studies ? ?CBC: ?Recent Labs  ?Lab 03/20/22 ?0304 03/22/22 ?0112 03/25/22 ?9407  ?WBC 17.8* 15.0* 13.9*  ?NEUTROABS  --  13.2* 11.3*  ?HGB 12.0 11.9* 12.3  ?HCT 37.4 38.5 39.1  ?MCV 89.7 92.5 92.2  ?PLT 202 182 148*  ? ?Basic Metabolic Panel: ?Recent Labs  ?Lab 03/20/22 ?0304 03/22/22 ?0112 03/25/22 ?6808  ?NA 139 140 138  ?K 4.8 4.4 4.4  ?CL 103 104 106  ?CO2 24 27 22   ?GLUCOSE 182* 114* 86  ?BUN 60* 61* 43*  ?CREATININE 1.17* 1.20* 1.11*  ?CALCIUM 9.7 9.5 9.7  ?MG 1.8 1.7 1.7  ?PHOS 3.9 4.5 3.3  ? ?GFR: ?Estimated Creatinine Clearance: 39.1 mL/min (A) (by C-G formula based on SCr of 1.11 mg/dL (H)). ?Liver Function Tests: ?Recent Labs  ?Lab 03/25/22 ?8110  ?AST 26  ?ALT 53*  ?ALKPHOS 64  ?BILITOT 0.6  ?PROT 6.3*  ?ALBUMIN 2.5*  ? ? ?No results for input(s): LIPASE, AMYLASE in the last 168 hours. ?Recent Labs  ?Lab 03/25/22 ?1056  ?AMMONIA 17  ? ?Coagulation Profile: ?No results for input(s): INR, PROTIME in the last 168 hours. ? ?Cardiac Enzymes: ?No results for input(s): CKTOTAL, CKMB, CKMBINDEX, TROPONINI in the last 168  hours. ? ?BNP (last 3 results) ?No results for input(s): PROBNP in the last 8760 hours. ?HbA1C: ?No results for input(s): HGBA1C in the last 72 hours. ?CBG: ?Recent Labs  ?Lab 03/26/22 ?0428 03/26/22 ?6520 03/26/22 ?7619 03/26/22 ?1550 03/26/22 ?1230  ?GLUCAP 129* 88 86 91 112*  ? ?Lipid Profile: ?No results for input(s): CHOL, HDL, LDLCALC, TRIG, CHOLHDL, LDLDIRECT in the last 72 hours. ? ?Thyroid Function Tests: ?No results for input(s): TSH, T4TOTAL, FREET4, T3FREE, THYROIDAB in the last 72 hours. ?Anemia Panel: ?No results for input(s): VITAMINB12, FOLATE, FERRITIN, TIBC, IRON, RETICCTPCT in the last 72 hours. ?Sepsis Labs: ?No results for input(s): PROCALCITON, LATICACIDVEN in the last 168 hours. ? ?No results  found for this or any previous visit (from the past 240 hour(s)). ? ?  ? ? ? ? ? ?Radiology Studies: ?CT HEAD WO CONTRAST (5MM) ? ?Result Date: 03/25/2022 ?CLINICAL DATA:  Mental status change, unknown caus

## 2022-03-26 NOTE — Progress Notes (Addendum)
Neurology Progress Note ? ?Subjective: ?Overnight events: Hypoglycemic episodes requiring D5 LR. No seizure on cEEG.  ?Patient was sitting OOB with friend, awake, alert, EEG electrodes in place. She denied acute concerns or changes.  ? ? ?Objective: ? ?Inpatient Medications ? sodium chloride   Intravenous Once  ? apixaban  2.5 mg Oral BID  ? chlorhexidine gluconate (MEDLINE KIT)  15 mL Mouth Rinse BID  ? [START ON 03/27/2022] famotidine  20 mg Oral Daily  ? [START ON 03/27/2022] levothyroxine  150 mcg Oral Once per day on Sun Mon Wed Thu Fri  ? [START ON 03/29/2022] levothyroxine  225 mcg Oral Once per day on Tue Sat  ? mouth rinse  15 mL Mouth Rinse q12n4p  ? metoprolol tartrate  12.5 mg Oral BID  ? nystatin  5 mL Oral QID  ? sodium chloride flush  3 mL Intravenous Q12H  ? ? ?Current vital signs: ?BP 114/75 (BP Location: Right Arm)   Pulse 90   Temp 97.9 ?F (36.6 ?C) (Axillary)   Resp 14   Ht _0  (1.575 m)   Wt 78 kg   SpO2 98%   BMI 31.45 kg/m?  ?Vital signs in last 24 hours: ?Temp:  [97.9 ?F (36.6 ?C)-98.6 ?F (37 ?C)] 97.9 ?F (36.6 ?C) (04/05 0809) ?Pulse Rate:  [81-99] 90 (04/05 1230) ?Resp:  [14-20] 14 (04/05 1230) ?BP: (97-127)/(54-89) 114/75 (04/05 1230) ?SpO2:  [93 %-100 %] 98 % (04/05 1230) ?Weight:  [78 kg] 78 kg (04/05 0400) ? ?Intake/Output from previous day: ?04/04 0701 - 04/05 0700 ?In: 0  ?Out: 250 [Urine:250] ?Intake/Output this shift: ?No intake/output data recorded. ?Nutritional status:  ?Diet Order   ? ?       ?  DIET - DYS 1 Room service appropriate? Yes; Fluid consistency: Nectar Thick  Diet effective now       ?  ? ?  ?  ? ?  ? ? ?Neurologic Exam: ?Mental Status: Awake, alert.  ?Orientation: Oriented to person, place, time, and situation  ?Attention/Concentration: fair, able to follow conversation but required redirecting when completing neuro exam. ?Fund of Knowledge: Intact ?Language: Intact fluency, Intact comprehension, Intact repetition, and Intact naming ?Speech: Fluent, Without  Dysarthria, and Normal Prosody   ? ?Cranial Nerves:  ?Pupils: Equal and Reactive  ?Visual Fields: Blink to Threat grossly intact B/L ?CN III, IV, VI (Extra-Ocular Movements): EOMI, No nystagmus, and Normal saccades ?CN V: Normal sensation in V1, V2, V3 bilaterally ?CN VII: Normal, symmetric facial muscle strength bilaterally ?CN VIII: Auditory acuity intact to bedside testing ?CN IX/X: Normal palate elevation ?CN XI: Normal strength of bilateral trapezius and SCM muscles  ?CN XII: Full strength B/L with tongue-in-cheek testing ? ?Motor:  ?Strength: 5/5 x 4 extremities ?Bulk:: Age-Appropriate atrophy  ?Tone: Normal in the upper and lower extremities ?Involuntary Movements (asterixis, tremor, etc): Absent  ?Pronator Drift: Absent ? ?Sensory: ?Light Touch: Intact in all 4 extremities  ?No extinction to double simultaneous stimulation   ? ?Coordination: ?Mild ataxia UE ? ?Gait:  Not assessed but son says she can walk alone when she is awake and alert.    ? ?Lab Results: ?Results for orders placed or performed during the hospital encounter of 03/13/22 (from the past 48 hour(s))  ?Glucose, capillary     Status: None  ? Collection Time: 03/24/22  4:38 PM  ?Result Value Ref Range  ? Glucose-Capillary 73 70 - 99 mg/dL  ?  Comment: Glucose reference range applies only to samples taken after  fasting for at least 8 hours.  ?Glucose, capillary     Status: Abnormal  ? Collection Time: 03/24/22  8:30 PM  ?Result Value Ref Range  ? Glucose-Capillary 103 (H) 70 - 99 mg/dL  ?  Comment: Glucose reference range applies only to samples taken after fasting for at least 8 hours.  ?Glucose, capillary     Status: None  ? Collection Time: 03/25/22  1:01 AM  ?Result Value Ref Range  ? Glucose-Capillary 85 70 - 99 mg/dL  ?  Comment: Glucose reference range applies only to samples taken after fasting for at least 8 hours.  ?CBC with Differential/Platelet     Status: Abnormal  ? Collection Time: 03/25/22  3:31 AM  ?Result Value Ref Range  ? WBC  13.9 (H) 4.0 - 10.5 K/uL  ? RBC 4.24 3.87 - 5.11 MIL/uL  ? Hemoglobin 12.3 12.0 - 15.0 g/dL  ? HCT 39.1 36.0 - 46.0 %  ? MCV 92.2 80.0 - 100.0 fL  ? MCH 29.0 26.0 - 34.0 pg  ? MCHC 31.5 30.0 - 36.0 g/dL  ? RDW 17.0 (H) 11.5 - 15.5 %  ? Platelets 148 (L) 150 - 400 K/uL  ? nRBC 0.0 0.0 - 0.2 %  ? Neutrophils Relative % 80 %  ? Neutro Abs 11.3 (H) 1.7 - 7.7 K/uL  ? Lymphocytes Relative 8 %  ? Lymphs Abs 1.1 0.7 - 4.0 K/uL  ? Monocytes Relative 6 %  ? Monocytes Absolute 0.8 0.1 - 1.0 K/uL  ? Eosinophils Relative 2 %  ? Eosinophils Absolute 0.3 0.0 - 0.5 K/uL  ? Basophils Relative 1 %  ? Basophils Absolute 0.1 0.0 - 0.1 K/uL  ? Immature Granulocytes 3 %  ? Abs Immature Granulocytes 0.36 (H) 0.00 - 0.07 K/uL  ?  Comment: Performed at [facility], [place] [address] [place], [place] [address]  ?Comprehensive metabolic panel     Status: Abnormal  ? Collection Time: 03/25/22  3:31 AM  ?Result Value Ref Range  ? Sodium 138 135 - 145 mmol/L  ? Potassium 4.4 3.5 - 5.1 mmol/L  ? Chloride 106 98 - 111 mmol/L  ? CO2 22 22 - 32 mmol/L  ? Glucose, Bld 86 70 - 99 mg/dL  ?  Comment: Glucose reference range applies only to samples taken after fasting for at least 8 hours.  ? BUN 43 (H) 8 - 23 mg/dL  ? Creatinine, Ser 1.11 (H) 0.44 - 1.00 mg/dL  ? Calcium 9.7 8.9 - 10.3 mg/dL  ? Total Protein 6.3 (L) 6.5 - 8.1 g/dL  ? Albumin 2.5 (L) 3.5 - 5.0 g/dL  ? AST 26 15 - 41 U/L  ? ALT 53 (H) 0 - 44 U/L  ? Alkaline Phosphatase 64 38 - 126 U/L  ? Total Bilirubin 0.6 0.3 - 1.2 mg/dL  ? GFR, Estimated 50 (L) >60 mL/min  ?  Comment: (NOTE) ?Calculated using the CKD-EPI Creatinine Equation (2021) ?  ? Anion gap 10 5 - 15  ?  Comment: Performed at [facility], [place] [address] [place], [place] [address]  ?Magnesium     Status: None  ? Collection Time: 03/25/22  3:31 AM  ?Result Value Ref Range  ? Magnesium 1.7 1.7 - 2.4 mg/dL  ?  Comment: Performed at [facility], [place] [address], [place], [place] [address]  ?Phosphorus     Status:  None  ? Collection Time: 03/25/22  3:31 AM  ?Result Value Ref  Range  ? Phosphorus 3.3 2.5 - 4.6 mg/dL  ?  Comment: Performed at [facility], [place] [address], [place], [place] [address]  ?Glucose, capillary     Status: None  ? Collection Time: 03/25/22  4:05 AM  ?Result Value Ref Range  ? Glucose-Capillary 97 70 - 99 mg/dL  ?  Comment: Glucose reference range applies only to samples taken after fasting for at least 8 hours.  ?Glucose, capillary     Status: None  ? Collection Time: 03/25/22  7:52 AM  ?Result Value Ref Range  ? Glucose-Capillary 97 70 - 99 mg/dL  ?  Comment: Glucose reference range applies only to samples taken after fasting for at least 8 hours.  ?Ammonia     Status: None  ? Collection Time: 03/25/22 10:56 AM  ?Result Value Ref Range  ? Ammonia 17 9 - 35 umol/L  ?  Comment: Performed at [facility], [place] [address], [place], [place] [address]  ?Blood gas, arterial     Status: Abnormal  ? Collection Time: 03/25/22 11:20 AM  ?Result Value Ref Range  ? pH, Arterial 7.47 (H) 7.35 - 7.45  ? pCO2 arterial 32 32 - 48 mmHg  ? pO2, Arterial 83 83 - 108 mmHg  ? Bicarbonate 23.3 20.0 - 28.0 mmol/L  ? Acid-Base Excess 0.3 0.0 - 2.0 mmol/L  ? O2 Saturation 98.4 %  ? Patient temperature 36.7   ? Collection site LEFT RADIAL   ? Drawn by COLLECTED BY RT   ?  Comment: ROB RT  ? Allens test (pass/fail) PASS PASS  ?  Comment: Performed at [facility], [place] [address], [place], [place] [address]  ?Glucose, capillary     Status: None  ? Collection Time: 03/25/22 11:46 AM  ?Result Value Ref Range  ? Glucose-Capillary 96 70 - 99 mg/dL  ?  Comment: Glucose reference range applies only to samples taken after fasting for at least 8 hours.  ?Glucose, capillary     Status: None  ? Collection Time: 03/25/22  2:50 PM  ?Result Value Ref Range  ? Glucose-Capillary 76 70 - 99 mg/dL  ?  Comment: Glucose reference range applies only to samples taken after fasting for at least 8 hours.  ?Glucose, capillary     Status:  Abnormal  ? Collection Time: 03/25/22  4:50 PM  ?Result Value Ref Range  ? Glucose-Capillary 107 (H) 70 - 99 mg/dL  ?  Comment: Glucose reference range applies only to samples taken after fasting for at least 8 hours

## 2022-03-26 NOTE — Significant Event (Addendum)
Patient has remained hypoglycemic multiple times.  Will restart D5 LR at 75.  Check CBG hourly for next few hours until blood sugar stable.  Hold insulin coverage for now.  Closely follow CBGs. ? ?Lauren Henry ?

## 2022-03-26 NOTE — Progress Notes (Addendum)
Palliative: ? ?HPI:81 year old female with history of PAF, complete heart block status post pacemaker which is not compatible to MRI, nonobstructive CAD, diastolic dysfunction, hypothyroidism, chronic kidney disease stage IIIb, recent multiple hospitalization presented to the ER on 3/23 with left eye swelling for 1 day. 3/2-3/5, admitted to the hospital with syncopal episode in the setting of UTI. 3/17-3/19, failure to thrive symptoms.  CT scan chest abdomen pelvis negative for malignancy.  Given prednisone on discharge for appetite stimulation. 3/23, presented back to the hospital with left eye swelling, more slurred speech, neck swelling with difficulty swallowing and intermittent coughing while eating.  Patient noted to be breaking into rashes and progressive swelling of the left eye and difficulty breathing. At the emergency room afebrile with stable vital signs.  Creatinine 2.05.  Mildly elevated AST and ALT.  Chest x-ray was normal.  She was given 500 cc of normal saline and admitted to the hospital. 3/24, she was on the medical floor.  Facial swelling worsened and she started having tongue swelling along with tachypnea and difficulty breathing.  She was found with edematous tongue, started on high-dose steroids , intubated and admitted to the intensive care unit. Successfully extubated but hospitalization complicated by fluctuating mental status and poor nutritional state with dysphagia in setting of overall failure to thrive.  ? ?I met today with Lauren Henry and her friend at bedside. She is sitting up in recliner and repositioning herself. She is completely awake and interactive. She is answering my questions appropriately (I asked very simple questions). She has recently been approved for modified diet and is awaiting lunch. She says she is hungry. She does mention to me she wants to get stronger and I encouraged that she needs to eat well to have energy to work with therapy and get stronger.  ? ?I called  and spoke with husband, Lauren Henry. Lauren Henry is pleased with his wife's progress. He notes that he is seeing very slow movement in the right direction and is encouraged that her mental status will continue to slowly improve and allow her to be able to receive nutrition. We discussed the importance that she needs consistent improvement in mental status in order to allow her any chance of improvement. He acknowledges the challenges ahead but still hopeful for improvement. Will need more conversation if she continues to fluctuate. Palliative will continue to follow along with progress and support of family (likely will not see daily).  ? ?All questions/concerns addressed. Emotional support provided.  ? ?Exam: Alert, underlying confusion regarding complex situation. Answering simple questions appropriately. No distress. Breathing regular, unlabored. Abd soft.  ? ?Plan:  ?- Watchful waiting. Family still hopeful for some level of improvement.  ?- Palliative to follow along.  ? ?25 min ? ?Vinie Sill, NP ?Palliative Medicine Team ?Pager [phone] (Please see [email or web address] for schedule) ?Team Phone [phone]  ? ? ?Greater than 50%  of this time was spent counseling and coordinating care related to the above assessment and plan   ?

## 2022-03-26 NOTE — Progress Notes (Signed)
PT Cancellation Note ? ?Patient Details ?Name: Lauren Henry ?MRN: [id number] ?DOB: 25-Oct-1941 ? ? ?Cancelled Treatment:    Reason Eval/Treat Not Completed: Patient at procedure or test/unavailable ? ?Patient working with SLP. Also noted to still be having EEG done. ? ? ?Jerolyn Center, PT ?Acute Rehabilitation Services  ?Pager [phone] ?Office [phone] ? ? ?Scherrie November Treanna Dumler ?03/26/2022, 11:42 AM ?

## 2022-03-27 DIAGNOSIS — R4182 Altered mental status, unspecified: Secondary | ICD-10-CM | POA: Diagnosis not present

## 2022-03-27 DIAGNOSIS — H5789 Other specified disorders of eye and adnexa: Secondary | ICD-10-CM | POA: Diagnosis not present

## 2022-03-27 LAB — GLUCOSE, CAPILLARY
Glucose-Capillary: 102 mg/dL — ABNORMAL HIGH (ref 70–99)
Glucose-Capillary: 99 mg/dL (ref 70–99)
Glucose-Capillary: 127 mg/dL — ABNORMAL HIGH (ref 70–99)
Glucose-Capillary: 100 mg/dL — ABNORMAL HIGH (ref 70–99)
Glucose-Capillary: 114 mg/dL — ABNORMAL HIGH (ref 70–99)

## 2022-03-27 MED ORDER — ADULT MULTIVITAMIN W/MINERALS CH
1.0000 | ORAL_TABLET | Freq: Every day | ORAL | Status: DC
  Administered 2022-03-27 – 2022-03-31 (×5): 1 via ORAL
  Filled 2022-03-27 (×5): qty 1

## 2022-03-27 MED ORDER — METOPROLOL TARTRATE 25 MG PO TABS
25.0000 mg | ORAL_TABLET | Freq: Two times a day (BID) | ORAL | Status: DC
  Administered 2022-03-27 – 2022-03-29 (×5): 25 mg via ORAL
  Filled 2022-03-27 (×5): qty 1

## 2022-03-27 NOTE — Procedures (Signed)
Patient Name: Lauren Henry  ?MRN: [id number]  ?Epilepsy Attending: Charlsie Quest  ?Referring Physician/Provider: Caryl Pina, MD ?Duration: 03/26/2022 1451 to 03/26/2022 2358 ?  ?Patient history: 81yo F with ams. EEG to evaluate for seizure. ?  ?Level of alertness: Awake, asleep ?  ?AEDs during EEG study: None ?  ?Technical aspects: This EEG study was done with scalp electrodes positioned according to the 10-20 International system of electrode placement. Electrical activity was acquired at a sampling rate of 500Hz  and reviewed with a high frequency filter of 70Hz  and a low frequency filter of 1Hz . EEG data were recorded continuously and digitally stored.  ?  ?Description: The posterior dominant rhythm consists of 7.5 activity of moderate voltage (25-35 uV) seen predominantly in posterior head regions, symmetric and reactive to eye opening and eye closing.  Sleep was characterized by vertex waves, sleep spindles (12-14), maximum frontocentral region.   EEG showed continuous generalized 5 to 7 Hz theta slowing admixed with intermittent generalized 2 to 3 Hz delta slowing.  Hyperventilation and photic stimulation were not performed.    ?  ?  ?ABNORMALITY ?- Continuous slow, generalized ?  ?IMPRESSION: ?This study is suggestive of mild diffuse encephalopathy, nonspecific etiology. No seizures or epileptiform discharges were seen throughout the recording. ? ?  ?Deirdre Gryder  ?

## 2022-03-27 NOTE — Progress Notes (Signed)
LTM EEG discontinues and leads taken off by tech [employer]. ?

## 2022-03-27 NOTE — TOC Progression Note (Addendum)
Transition of Care (TOC) - Progression Note  ? ? ?Patient Details  ?Name: Lauren Henry ?MRN: [id number] ?Date of Birth: [birth year] ? ?Transition of Care (TOC) CM/SW Contact  ?Harriet Masson, RN ?Phone Number: ?03/27/2022, 11:05 AM ? ?Clinical Narrative:    ?Husband requesting [facility] to arrange a meeting with family tomorrow to discuss hospice. Shanita with [facility] notified and she will reach out to husband. ?TOC to continue to follow  ? ? ? ?Expected Discharge Plan: Home w Hospice Care ?Barriers to Discharge: Continued Medical Work up ? ?Expected Discharge Plan and Services ?Expected Discharge Plan: Home w Hospice Care ?In-house Referral: Clinical Social Work ?  ?  ?Living arrangements for the past 2 months: Single Family Home ?                ?  ?  ?  ?  ?  ?  ?  ?  ?  ?  ? ? ?Social Determinants of Health (SDOH) Interventions ?  ? ?Readmission Risk Interventions ? ?  03/27/2022  ?  9:57 AM  ?Readmission Risk Prevention Plan  ?Transportation Screening Complete  ?PCP or Specialist Appt within 3-5 Days Complete  ?HRI or Home Care Consult Complete  ?Social Work Consult for Recovery Care Planning/Counseling Complete  ?Palliative Care Screening Complete  ?Medication Review Oceanographer) Complete  ? ? ?

## 2022-03-27 NOTE — Progress Notes (Signed)
Nutrition Follow-up ? ?DOCUMENTATION CODES:  ? ?Severe malnutrition in context of acute illness/injury ? ?INTERVENTION:  ? ?-MVI with minerals daily ?-Feeding assistance with meals ?-Hormel Shake TID with meals, each supplement provides 520 kcals and 22 grams protein ? ?NUTRITION DIAGNOSIS:  ? ?Severe Malnutrition related to acute illness as evidenced by moderate muscle depletion, moderate fat depletion, percent weight loss. ? ?Ongoing ? ?GOAL:  ? ?Patient will meet greater than or equal to 90% of their needs ? ?Progressing  ? ?MONITOR:  ? ?PO intake, Supplement acceptance, Diet advancement, Labs, Weight trends, Skin, I & O's ? ?REASON FOR ASSESSMENT:  ? ?Malnutrition Screening Tool ?  ? ?ASSESSMENT:  ? ?81 y.o. presented to the ED with L eye swelling. Recent admission 3/2-3/5 for UTI and syncopal episode and 3/17-3/19 for FTT symptoms. PMH includes CHF, CKD III, malnutrition, and HTN. Pt admitted with eye swelling, rash, dysphagia, and AKI on CKD. ? ?3/24 - transferred to ICU due to respiratory distress; Cortrak placed (tip placed) ?3/25 - intubated ?3/27 - Extubated  ?3/28 - Transferred to floor ?3/30 - MBS w/ SLP, recommend NPO  ?4/03 - Cortrak removed for MBS; MBS recommend NPO ?4/04 - s/p MBSS- recommend NPO with trials of purees and nectar thick liquids with SLP ?4/05 - s/p BSE- advanced to dysphagia 1 diet with nectar thick liquids  ? ?Reviewed I/O's: +2.1 L x 24 hours and +6.6 L since admission ? ?Pt continues with waxing and waning mental status. Pt is able to tolerate current diet, however, intake remains poor (noted meal completions 10%).  ? ?Palliative care following for goals of care discussions. Per MD notes, likely plan to discharge home with hospice.  ? ?Medications reviewed and include dextrose 5% in lactated ringers infusion @ 75 ml/hr.  ? ?Labs reviewed.  ? ?Diet Order:   ?Diet Order   ? ?       ?  DIET - DYS 1 Room service appropriate? Yes; Fluid consistency: Nectar Thick  Diet effective now        ?  ? ?  ?  ? ?  ? ? ?EDUCATION NEEDS:  ? ?Not appropriate for education at this time ? ?Skin:  Skin Assessment: Reviewed RN Assessment ? ?Last BM:  03/24/22 ? ?Height:  ? ?Ht Readings from Last 1 Encounters:  ?03/15/22 5\' 2"  (1.575 m)  ? ? ?Weight:  ? ?Wt Readings from Last 1 Encounters:  ?03/27/22 77.4 kg  ? ? ?Ideal Body Weight:  56.8 kg ? ?BMI:  Body mass index is 31.21 kg/m?. ? ?Estimated Nutritional Needs:  ? ?Kcal:  2000-2200 ? ?Protein:  100-115 grams ? ?Fluid:  >/= 2 L ? ? ? ?05/27/22, RD, LDN, CDCES ?Registered Dietitian II ?Certified Diabetes Care and Education Specialist ?Please refer to [facility] for RD and/or RD on-call/weekend/after hours pager  ?

## 2022-03-27 NOTE — Progress Notes (Signed)
?PROGRESS NOTE ? ? ? ?Lauren Henry  GLO:[id number] DOB: 1941/06/19 DOA: 03/13/2022 ?PCP: Haywood Pao, MD  ? ? ?Brief Narrative:  ?81 year old female with history of PAF, complete heart block status post pacemaker , nonobstructive CAD, diastolic dysfunction, hypothyroidism, chronic kidney disease stage IIIb, recent multiple hospitalization presented to the ER on 3/23 with left eye swelling for 1 day. ? ?3/2-3/5, admitted to the hospital with syncopal episode in the setting of UTI. ?3/17-3/19, failure to thrive symptoms.  CT scan chest abdomen pelvis negative for malignancy.  Given prednisone on discharge for appetite stimulation. ?3/23, presented back to the hospital with left eye swelling, more slurred speech, neck swelling with difficulty swallowing and intermittent coughing while eating.  Patient noted to be breaking into rashes and progressive swelling of the left eye and difficulty breathing. ?At the emergency room afebrile with stable vital signs.  Creatinine 2.05.  Mildly elevated AST and ALT.  Chest x-ray was normal.  She was given 500 cc of normal saline and admitted to the hospital. ? ?3/24, she was on the medical floor.  Facial swelling worsened and she started having tongue swelling along with tachypnea and difficulty breathing.  She was found with edematous tongue, started on high-dose steroids ,intubated and admitted to the intensive care unit. ? ?Remains in the hospital.  Fluctuating mental status.  Poor oral intake and challenging. ? ? ? ?Assessment & Plan: ?  ?Acute metabolic encephalopathy: ?Multiple head CT scans were without any abnormal findings.  Pacemaker incompatible to brain MRI. ?Patient started demonstrating drastic/dramatic changes in mental status over time. ?Ammonia is normal. ?ABG with no CO2 retention. ?Repeat CT head without acute findings. ?Long-term EEG with no evidence of epileptiform discharges. ?Some clinical improvement today but overall remains very debilitated and with  failure to thrive. ? ?Angioedema rash , conjunctival ecchymosis, neck swelling and edema:  ?Unknown cause.  Responded well to high-dose steroids.  IgA levels within normal limit.  Group A Streptococcus negative.  Neck swelling improving.  CT scan with no complications. ?Completed steroids.  Continue Pepcid.  Inflammation work-up mostly negative.  ESR and CRP trending down. Skin biopsy right arm 3/25, results are pending.   ?Rashes have improved and resolved. ? ?Acute hypoxemic respiratory failure secondary to above: Intubated to protect airway.  Extubated 3/27 and currently mostly on room air. Treated with Rocephin for presumed aspiration.  Completed antibiotic therapy.   ? ?Dysphagia: Today able to tolerate pur?ed diet with nectar thick liquids however with poor participation. ? ?Anemia of acute blood loss: Received 1 unit of PRBC with appropriate response.  Hemoglobin is stable since initial transfusion. ? ?AKI on CKD stage IIIb: At about her usual levels. ? ?Paroxysmal A-fib, complete heart block status post pacemaker: With elevated heart rate today.  She has missed doses of metoprolol.  She has a pacemaker in place.  Will increase metoprolol to 25 mg twice daily.  Therapeutic on Eliquis. ? ?Hypothyroidism: Synthroid replacement on high doses.  Continue. ? ?Hypoglycemia: Due to overall poor oral intake.  Failure to thrive.  Discontinue all insulin products.   ?Encourage high carb and high protein diet.  No restriction of diet.   ? ?Goal of care ,progressive weakness, generalized weakness, failure to thrive: ?Patient with progressive failure to thrive, poor appetite for at least 3 months now. ?Multiple investigations including CT scan of the neck, CT scan of the chest abdomen pelvis with no evidence of structural malignancy. Recent investigations including J88, folic acid, TSH, copper, methylmalonic acid  levels were normal. Electrolytes including magnesium, phosphorus is normal.  Lupus antibody negative. Seen by  neurology, do not think any spinal cord disease. ?4/6, though her wakefulness has improved somehow, she is not eating adequate diet and unlikely a rehab candidate.  Discussed in detail with patient's husband. ?He is agreeable to meet with hospice and know more about care at home or care at hospice home. ? ? ? ?DVT prophylaxis: apixaban (ELIQUIS) tablet 2.5 mg Start: 03/26/22 2200 eliquis  ?apixaban (ELIQUIS) tablet 2.5 mg  ? ?Code Status: Full code. ?Family Communication: Patient's husband and friend Hassan Rowan at the bedside. ?Disposition Plan: Status is: Inpatient ?Remains inpatient appropriate because: Unsafe disposition plan. ?  ? ? ?Consultants:  ?Neurology ?Critical care ?Palliative care ? ?Procedures:  ?Multiple as above. ? ?Antimicrobials:  ?Completed antibiotics. ? ? ?Subjective: ? ?Patient seen and examined.  Husband and friend Hassan Rowan at the bedside.  She was alert awake and answering appropriately.  She tells me she ate half of the breakfast that included very small portion of egg and some cakes.  Denies any difficulty swallowing, however does not have appetite. ?Heart rate remains elevated 120-140 at times with A-fib.  Patient denies any chest pain or palpitations. ? ? ?Objective: ?Vitals:  ? 03/27/22 0835 03/27/22 0930 03/27/22 1017 03/27/22 1240  ?BP:   101/73 102/78  ?Pulse:  86 (!) 59 79  ?Resp:   17 18  ?Temp:   97.6 ?F (36.4 ?C) 97.7 ?F (36.5 ?C)  ?TempSrc:   Axillary Axillary  ?SpO2: 96%     ?Weight:      ?Height:      ? ? ?Intake/Output Summary (Last 24 hours) at 03/27/2022 1330 ?Last data filed at 03/27/2022 0559 ?Gross per 24 hour  ?Intake 2103.38 ml  ?Output --  ?Net 2103.38 ml  ? ? ?Filed Weights  ? 03/25/22 0402 03/26/22 0400 03/27/22 0500  ?Weight: 79.7 kg 78 kg 77.4 kg  ? ? ?Examination: In the morning rounds. ? ?General: Frail and debilitated.  Laying in bed.  Chronically sick looking, more comfortable today.  On room air. ?Mostly alert oriented when awake.  Prefers to keep her eyes closed and  sleep. ?Cardiovascular: S1-S2 normal.  Irregularly irregular.  Pacemaker in place. ?Respiratory: Bilateral clear. On room air. ?Gastrointestinal: Soft.  Nontender.  Bowel sound present. ?Ext: No swelling or edema.  No cyanosis.  No clubbing. ?Neuro: Alert and oriented. generalized weakness but no focal deficits. ?Musculoskeletal: No deformities. ? ? ? ? ? ? ? ?Data Reviewed: I have personally reviewed following labs and imaging studies ? ?CBC: ?Recent Labs  ?Lab 03/22/22 ?0112 03/25/22 ?7482  ?WBC 15.0* 13.9*  ?NEUTROABS 13.2* 11.3*  ?HGB 11.9* 12.3  ?HCT 38.5 39.1  ?MCV 92.5 92.2  ?PLT 182 148*  ? ?Basic Metabolic Panel: ?Recent Labs  ?Lab 03/22/22 ?0112 03/25/22 ?7078  ?NA 140 138  ?K 4.4 4.4  ?CL 104 106  ?CO2 27 22  ?GLUCOSE 114* 86  ?BUN 61* 43*  ?CREATININE 1.20* 1.11*  ?CALCIUM 9.5 9.7  ?MG 1.7 1.7  ?PHOS 4.5 3.3  ? ?GFR: ?Estimated Creatinine Clearance: 38.9 mL/min (A) (by C-G formula based on SCr of 1.11 mg/dL (H)). ?Liver Function Tests: ?Recent Labs  ?Lab 03/25/22 ?6754  ?AST 26  ?ALT 53*  ?ALKPHOS 64  ?BILITOT 0.6  ?PROT 6.3*  ?ALBUMIN 2.5*  ? ? ?No results for input(s): LIPASE, AMYLASE in the last 168 hours. ?Recent Labs  ?Lab 03/25/22 ?1056  ?AMMONIA 17  ? ?Coagulation Profile: ?  No results for input(s): INR, PROTIME in the last 168 hours. ? ?Cardiac Enzymes: ?No results for input(s): CKTOTAL, CKMB, CKMBINDEX, TROPONINI in the last 168 hours. ? ?BNP (last 3 results) ?No results for input(s): PROBNP in the last 8760 hours. ?HbA1C: ?No results for input(s): HGBA1C in the last 72 hours. ?CBG: ?Recent Labs  ?Lab 03/26/22 ?2142 03/26/22 ?2357 03/27/22 ?0358 03/27/22 ?0830 03/27/22 ?1243  ?GLUCAP 104* 95 102* 99 127*  ? ?Lipid Profile: ?No results for input(s): CHOL, HDL, LDLCALC, TRIG, CHOLHDL, LDLDIRECT in the last 72 hours. ? ?Thyroid Function Tests: ?No results for input(s): TSH, T4TOTAL, FREET4, T3FREE, THYROIDAB in the last 72 hours. ?Anemia Panel: ?No results for input(s): VITAMINB12, FOLATE, FERRITIN,  TIBC, IRON, RETICCTPCT in the last 72 hours. ?Sepsis Labs: ?No results for input(s): PROCALCITON, LATICACIDVEN in the last 168 hours. ? ?No results found for this or any previous visit (from the past 240

## 2022-03-27 NOTE — Progress Notes (Signed)
Speech Language Pathology Treatment: Dysphagia  ?Patient Details ?Name: Lauren Henry ?MRN: [id number] ?DOB: June 15, 1941 ?Today's Date: 03/27/2022 ?Time: [id number] ?SLP Time Calculation (min) (ACUTE ONLY): 12 min ? ?Assessment / Plan / Recommendation ?Clinical Impression ? Pt was lethargic this morning, not able to be adequately aroused to attempt PO intake. Friend at bedside shared that she consumed a limited amount at breakfast: Half of her eggs, a bite or two of other foods, and a few sips of nectar thick juice. She did say that there she knew to cue for the chin tuck, but still acknowledged that there was some intermittent coughing with intake. Strategies were reinforced, with education provided on rationale as well. Discussed the importance of not offering POs when she is as lethargic as she is now, and she shared that this morning she would wake up well but have difficulty maintaining her alertness, acknowledging the challenge this presents when trying to maintain adequate oral intake. Will leave current diet order in place, offering POs only if she is optimally alert. SLP will continue to follow and assist as able - note plan for further discussions about [place].  ?  ?HPI HPI: Patient is an 81 y.o. female with PMH: afib, complete heart block s/p pacemaker (not MRI compatable), nonobstructive CAD, CKD stage III who presented to the hospital on 3/23 with left eye swelling. Patient had been recently hospitalized from 3/2 to 3/5 after having syncopal episode in setting of UTI where she was taken off blood pressure medications and treated with antibiotics. She was then hospitalized 3/17-3/19 for FTT symptoms with her husband reporting a progressive decline and at least 30lb weight loss since end of December. There was concern for possible underlying malignany but CT chest, abdomen and pelvis did not show any acute cause for her symptoms. Scans noted an 8 mm noncalcified nodule of the right lower lobe for which repeat CT  scan recommended in 2 to 3 months.  She was also noted to have new tremor that was not thought to be parkinsonian in nature with mildly elevated free T4, but levothyroxine dose was deferred to PCP. In ED, patient was afebrile and vital signs relatively stable, CXR showed no acute disease. On 3/24 RN called into patient's room by husband due to concerns of trouble breathing. Rapid Response was called and patient started on 2L [place] and transfered to unit 25M. on 3/25 patient with increased WOB and CXR showing interstitial changes, possible aspiration. She was intubated at [place] on 3/25 and extubated to 4L via [place] on 3/27 at 1018. ?  ?   ?SLP Plan ? Continue with current plan of care ? ?  ?  ?Recommendations for follow up therapy are one component of a multi-disciplinary discharge planning process, led by the attending physician.  Recommendations may be updated based on patient status, additional functional criteria and insurance authorization. ?  ? ?Recommendations  ?Diet recommendations: Dysphagia 1 (puree);Nectar-thick liquid ?Liquids provided via: Cup;Straw ?Medication Administration: Whole meds with puree ?Supervision: Patient able to self feed;Staff to assist with self feeding;Full supervision/cueing for compensatory strategies ?Compensations: Chin tuck;Minimize environmental distractions;Slow rate;Small sips/bites ?Postural Changes and/or Swallow Maneuvers: Seated upright 90 degrees  ?   ?    ?   ? ? ? ? Oral Care Recommendations: Oral care QID;Staff/trained caregiver to provide oral care ?Follow Up Recommendations: Skilled nursing-short term rehab (<3 hours/day) (unless family is choosing to return home with hospice) ?Assistance recommended at discharge: Frequent or constant Supervision/Assistance ?SLP Visit Diagnosis: Dysphagia, unspecified (  R13.10) ?Plan: Continue with current plan of care ? ? ? ? ?  ?  ? ? ?Osie Bond., M.A. CCC-SLP ?Acute Rehabilitation Services ?Office [phone] ? ?Secure chat  preferred ? ? ?03/27/2022, 11:59 AM ?

## 2022-03-27 NOTE — Progress Notes (Signed)
PT Cancellation Note ? ?Patient Details ?Name: Lauren Henry ?MRN: [id number] ?DOB: 1941-01-09 ? ? ?Cancelled Treatment:    Reason Eval/Treat Not Completed: Patient's level of consciousness ? ?Attempted to work with pt at 11:30 and again now. Patient sleeping soundly and unable to arouse.  ? ? ?Jerolyn Center, PT ?Acute Rehabilitation Services  ?Pager [phone] ?Office [phone] ? ? ?[name] ?03/27/2022, 1:42 PM ?

## 2022-03-27 NOTE — Progress Notes (Signed)
Civil engineer, contracting [facility]) Hospital Liaison note.   ? ?Reached out to patient's husband to coordinate a time on Friday to meet and discuss hospice services. He requested to call me back with times he and family will be available. ? ?Hospital liaison will follow up tomorrow.  ? ?Please do not hesitate to call with questions.   ?Thank you,   ?Elsie Saas, RN, CCM      ?[facility] Hospital Liaison   ?[phone] ?

## 2022-03-28 DIAGNOSIS — Z515 Encounter for palliative care: Secondary | ICD-10-CM | POA: Diagnosis not present

## 2022-03-28 DIAGNOSIS — H5789 Other specified disorders of eye and adnexa: Secondary | ICD-10-CM | POA: Diagnosis not present

## 2022-03-28 DIAGNOSIS — Z7189 Other specified counseling: Secondary | ICD-10-CM | POA: Diagnosis not present

## 2022-03-28 LAB — GLUCOSE, CAPILLARY
Glucose-Capillary: 105 mg/dL — ABNORMAL HIGH (ref 70–99)
Glucose-Capillary: 113 mg/dL — ABNORMAL HIGH (ref 70–99)
Glucose-Capillary: 115 mg/dL — ABNORMAL HIGH (ref 70–99)
Glucose-Capillary: 78 mg/dL (ref 70–99)
Glucose-Capillary: 99 mg/dL (ref 70–99)
Glucose-Capillary: 99 mg/dL (ref 70–99)

## 2022-03-28 MED ORDER — APIXABAN 5 MG PO TABS
5.0000 mg | ORAL_TABLET | Freq: Two times a day (BID) | ORAL | Status: DC
  Administered 2022-03-28 – 2022-03-29 (×2): 5 mg via ORAL
  Filled 2022-03-28 (×2): qty 1

## 2022-03-28 MED ORDER — DRONABINOL 2.5 MG PO CAPS
2.5000 mg | ORAL_CAPSULE | Freq: Two times a day (BID) | ORAL | Status: DC
  Administered 2022-03-28 – 2022-03-30 (×6): 2.5 mg via ORAL
  Filled 2022-03-28 (×6): qty 1

## 2022-03-28 NOTE — Progress Notes (Signed)
Civil engineer, contracting [facility])  Hospital Liaison: RN note     ?    ?Family meeting to discuss hospice services in the home. Family unsure of the direction they want to take and are interested in her receiving PT.  [facility] will follow for disposition and assist if she needs to change to hospice support.  ?   ?If you have questions or need assistance, please call [phone] or contact the hospital Liaison listed on AMION.     ? ?Thank you for this referral.    ?     ?Elsie Saas, RN, CCM  ?[facility] Hospital Liaison   ?[phone]- [phone] ?

## 2022-03-28 NOTE — [place] FL2 (Signed)
?[place] MEDICAID FL2 LEVEL OF CARE SCREENING TOOL  ?  ? ?IDENTIFICATION  ?Patient Name: ?Lauren Henry Birthdate: 10/12/1941 Sex: female Admission Date (Current Location): ?03/13/2022  ?[place] and [place] Number: ? [place] ?  Facility and Address:  ?[facility] [place] [address] [place], [place] [address] ?     Provider Number: ?[id number]  ?Attending Physician Name and Address:  ?Barb Merino, MD ? Relative Name and Phone Number:  ?  ?   ?Current Level of Care: ?Hospital Recommended Level of Care: ?[place] Prior Approval Number: ?  ? ?Date Approved/Denied: ?  PASRR Number: ?[id number] A ? ?Discharge Plan: ?SNF ?  ? ?Current Diagnoses: ?Patient Active Problem List  ? Diagnosis Date Noted  ? Angio-edema   ? Acute kidney injury superimposed on chronic kidney disease ([place]) 03/13/2022  ? Neck and right arm swelling 03/13/2022  ? Rash 03/13/2022  ? Dysphagia 03/13/2022  ? Swelling of right upper extremity 03/13/2022  ? Eye swelling, left 03/13/2022  ? Chronic diastolic CHF (congestive heart failure) ([place]) 03/13/2022  ? Protein-calorie malnutrition, severe 03/09/2022  ? Failure to thrive in adult 03/07/2022  ? 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 Microscopic hematuria 03/07/2022  ? Stage 3b chronic kidney disease (CKD) ([place]) 02/21/2022  ? Syncope and collapse 02/20/2022  ? UTI (urinary tract infection) 02/20/2022  ? Syncope 02/20/2022  ? Postural dizziness with presyncope 02/18/2022  ? Weakness 02/17/2022  ? Orthostatic hypotension 02/17/2022  ? Glaucoma 02/17/2022  ? Gastroenteropathy 08/28/2020  ? AKI (acute kidney injury) ([place]) 08/27/2020  ? Nausea vomiting and diarrhea 08/23/2020  ? Chronic anticoagulation 11/24/2018  ? CAD in native artery 11/24/2018  ? Chronic renal disease, stage III ([place]) 11/24/2018  ? Anemia 11/24/2018  ? Elevated troponin with new LBBB   ? Urinary urgency   ? Urinary frequency   ? Presence of permanent cardiac pacemaker   ? Joint pain   ? History of  colon polyps   ? History of blood transfusion   ? Diarrhea   ? Arthritis   ? 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 Preop cardiovascular exam 10/18/2015  ? Chest pain 11/09/2014  ? PAF (paroxysmal atrial fibrillation) ([place]) 11/09/2014  ? Essential hypertension 10/30/2014  ? Hyperlipidemia 10/30/2014  ? Hypothyroidism 10/30/2014  ? CHB (complete heart block) ([place]) s/p PPM 10/30/2014  ? 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 Right-sided low back pain with right-sided sciatica 09/14/2014  ? Knee pain 01/31/2013  ? Back pain 08/27/2012  ? Right groin pain 01/13/2012  ? Anxiety disorder 09/23/2009  ? ? ?Orientation RESPIRATION BLADDER Height & Weight   ?  ?Self, Place ? Normal Continent, External catheter Weight: 166 lb 7.2 oz (75.5 kg) (blankets taken off bed) ?Height:  5' 2" (157.5 cm)  ?BEHAVIORAL SYMPTOMS/MOOD NEUROLOGICAL BOWEL NUTRITION STATUS  ?    Continent Diet (See dc summary)  ?AMBULATORY STATUS COMMUNICATION OF NEEDS Skin   ?Extensive Assist Verbally Surgical wounds (Closed incision on arm and thigh) ?  ?  ?  ?    ?     ?     ? ? ?Personal Care Assistance Level of Assistance  ?Bathing, Feeding, Dressing Bathing Assistance: Maximum assistance ?Feeding assistance: Limited assistance ?Dressing Assistance: Limited assistance ?   ? ?Functional Limitations Info  ?Sight Sight Info: Impaired ?  ?   ? ? ?SPECIAL CARE FACTORS FREQUENCY  ?PT (By licensed PT), OT (By licensed OT)   ?  ?PT Frequency:  5x/week ?OT Frequency: 5x/week ?  ?  ?  ?   ? ? ?Contractures Contractures Info: Not present  ? ? ?Additional Factors Info  ?Code Status, Allergies Code Status Info: Full ?Allergies Info: Clindamycin/lincomycin, Methimazole, Olmesartan, Amlodipine ?  ?  ?  ?   ? ?Current Medications (03/28/2022):  This is the current hospital active medication list ?Current Facility-Administered Medications  ?Medication Dose Route Frequency Provider Last Rate Last Admin  ? 0.9 %  sodium chloride infusion (Manually  program via Guardrails IV Fluids)   Intravenous Once Timothy Lasso, MD      ? 0.9 %  sodium chloride infusion   Intravenous PRN Timothy Lasso, MD   Stopped at 03/18/22 1659  ? acetaminophen (TYLENOL) 160 MG/5ML solution 650 mg  650 mg Oral Q6H PRN Barb Merino, MD      ? Or  ? acetaminophen (TYLENOL) suppository 650 mg  650 mg Rectal Q6H PRN Barb Merino, MD      ? albuterol (PROVENTIL) (2.5 MG/3ML) 0.083% nebulizer solution 2.5 mg  2.5 mg Nebulization Q6H PRN Timothy Lasso, MD   2.5 mg at 03/14/22 0546  ? apixaban (ELIQUIS) tablet 2.5 mg  2.5 mg Oral BID Barb Merino, MD   2.5 mg at 03/28/22 0941  ? chlorhexidine gluconate (MEDLINE KIT) (PERIDEX) 0.12 % solution 15 mL  15 mL Mouth Rinse BID Timothy Lasso, MD   15 mL at 03/28/22 0942  ? dronabinol (MARINOL) capsule 2.5 mg  2.5 mg Oral BID AC Ferolito, Freada Bergeron, NP      ? famotidine (PEPCID) tablet 20 mg  20 mg Oral Daily Barb Merino, MD   20 mg at 03/28/22 0941  ? haloperidol lactate (HALDOL) injection 1 mg  1 mg Intravenous Q8H PRN Barb Merino, MD   1 mg at 03/25/22 1858  ? levothyroxine (SYNTHROID) tablet 150 mcg  150 mcg Oral Once per day on Sun Mon Wed Thu Fri Barb Merino, MD   150 mcg at 03/28/22 0454  ? [START ON 03/29/2022] levothyroxine (SYNTHROID) tablet 225 mcg  225 mcg Oral Once per day on Tue Sat Barb Merino, MD      ? lidocaine HCl (PF) (XYLOCAINE) 2 % injection 0-20 mL  0-20 mL Intradermal Once PRN Timothy Lasso, MD      ? lip balm (CARMEX) ointment   Topical PRN Barb Merino, MD   1 application. at 03/21/22 2229  ? MEDLINE mouth rinse  15 mL Mouth Rinse q12n4p Barb Merino, MD   15 mL at 03/27/22 1734  ? metoprolol tartrate (LOPRESSOR) tablet 25 mg  25 mg Oral BID Barb Merino, MD   25 mg at 03/28/22 0941  ? multivitamin with minerals tablet 1 tablet  1 tablet Oral Daily Barb Merino, MD   1 tablet at 03/28/22 0941  ? nystatin (MYCOSTATIN) 100000 UNIT/ML suspension 500,000 Units  5 mL Oral QID Reome, Earle J,  RPH   500,000 Units at 03/28/22 8280  ? phenol (CHLORASEPTIC) mouth spray 1 spray  1 spray Mouth/Throat PRN Timothy Lasso, MD   1 spray at 03/13/22 1810  ? polyvinyl alcohol (LIQUIFILM TEARS) 1.4 % ophthalmic solution 1 drop  1 drop Both Eyes PRN Timothy Lasso, MD   1 drop at 03/21/22 1953  ? sodium chloride flush (NS) 0.9 % injection 3 mL  3 mL Intravenous Q12H Timothy Lasso, MD   3 mL at 03/28/22 0948  ? ? ? ?Discharge Medications: ?Please see discharge summary for a list of discharge medications. ? ?Relevant  Imaging Results: ? ?Relevant Lab Results: ? ? ?Additional Information ?SSN: [id number]. Pfizer vaccines 10/04/21, 05/29/21, 09/25/20, 01/31/20, 01/10/20 ? ?Lissa Morales , LCSW ? ? ? ? ?

## 2022-03-28 NOTE — Progress Notes (Addendum)
? ?  Palliative Medicine Inpatient Follow Up Note ? ? ?HPI: ?81 year old female with history of PAF, complete heart block status post pacemaker which is not compatible to MRI, nonobstructive CAD, diastolic dysfunction, hypothyroidism, chronic kidney disease stage IIIb, recent multiple hospitalization presented to the ER on 3/23 with left eye swelling for 1 day. 3/2-3/5, admitted to the hospital with syncopal episode in the setting of UTI. 3/17-3/19, failure to thrive symptoms.  CT scan chest abdomen pelvis negative for malignancy.  Given prednisone on discharge for appetite stimulation. 3/23, presented back to the hospital with left eye swelling, more slurred speech, neck swelling with difficulty swallowing and intermittent coughing while eating.  Patient noted to be breaking into rashes and progressive swelling of the left eye and difficulty breathing. At the emergency room afebrile with stable vital signs.  Creatinine 2.05.  Mildly elevated AST and ALT.  Chest x-ray was normal.  She was given 500 cc of normal saline and admitted to the hospital. 3/24, she was on the medical floor.  Facial swelling worsened and she started having tongue swelling along with tachypnea and difficulty breathing.  She was found with edematous tongue, started on high-dose steroids , intubated and admitted to the intensive care unit. ?  ?Palliative care has been asked to get involved to further address goals of care. ? ?Today's Discussion (03/28/2022): ? ?*Please note that this is a verbal dictation therefore any spelling or grammatical errors are due to the "Dragon Medical One" system interpretation. ? ?Chart reviewed inclusive of vital signs, progress notes, laboratory results, and diagnostic images.  ? ?I met with Lauren Henry at bedside this morning. She is awake and alert. She shares with me that she has been anxiously awaiting breakfast. She denies pain, nausea, or SOB. ? ?I spoke to patients spouse, [name] who is optimistic for the future.  We reviewed the plan for her to transition to rehab when she is stable.  ? ?Questions and concerns addressed  ? ?Palliative Support Provided ? ?Objective Assessment: ?Vital Signs ?Vitals:  ? 03/28/22 0455 03/28/22 0832  ?BP: 115/87 (!) 133/91  ?Pulse: 82 89  ?Resp: 16 15  ?Temp: 98.3 ?F (36.8 ?C)   ?SpO2: 97% 96%  ? ? ?Intake/Output Summary (Last 24 hours) at 03/28/2022 0925 ?Last data filed at 03/28/2022 0026 ?Gross per 24 hour  ?Intake --  ?Output 600 ml  ?Net -600 ml  ? ? ?Last Weight  Most recent update: 03/28/2022  5:01 AM  ? ? Weight  ?75.5 kg (166 lb 7.2 oz)  ?      ? ?  ? ?Gen: Elderly Caucasian female in no acute distress ?HEENT: Moist mucous membranes ?CV: Regular rate and irregular rhythm ?PULM: On room air ?ABD: soft/nontender ?EXT: No edema ?Neuro: A+O x3 ? ?SUMMARY OF RECOMMENDATIONS   ?DNAR ?  ?Continue to treat what is treatable ?  ?For poor appetite will start --> Marinol 2.5mg  PO BID ? ?TOC - OP Palliative support on discharge ? ?Likely Skilled placement  ? ?Ongoing support ? ?MDM - Moderate ?______________________________________________________________________________________ ?Lamarr Lulas ?[place] Palliative Medicine Team ?Team Cell Phone: [phone] ?Please utilize secure chat with additional questions, if there is no response within 30 minutes please call the above phone number ? ?Palliative Medicine Team providers are available by phone from 7am to 7pm daily and can be reached through the team cell phone.  ?Should this patient require assistance outside of these hours, please call the patient's attending physician. ? ? ? ? ?

## 2022-03-28 NOTE — TOC Progression Note (Signed)
Transition of Care (TOC) - Progression Note  ? ? ?Patient Details  ?Name: Lauren Henry ?MRN: [id number] ?Date of Birth: 06/25/1941 ? ?Transition of Care (TOC) CM/SW Contact  ?Mearl Latin, LCSW ?Phone Number: ?03/28/2022, 9:36 AM ? ?Clinical Narrative:    ?9am-CSW received call from patient's spouse requesting SNF placement. CSW will workup. ? ?9:54am-CSW received another call from spouse requesting stating family is meeting with palliative to discuss hospice care. Will continue to follow.  ? ? ?Expected Discharge Plan: Home w Hospice Care ?Barriers to Discharge: Continued Medical Work up ? ?Expected Discharge Plan and Services ?Expected Discharge Plan: Home w Hospice Care ?In-house Referral: Clinical Social Work ?  ?  ?Living arrangements for the past 2 months: Single Family Home ?                ?  ?  ?  ?  ?  ?  ?  ?  ?  ?  ? ? ?Social Determinants of Health (SDOH) Interventions ?  ? ?Readmission Risk Interventions ? ?  03/27/2022  ?  9:57 AM  ?Readmission Risk Prevention Plan  ?Transportation Screening Complete  ?PCP or Specialist Appt within 3-5 Days Complete  ?HRI or Home Care Consult Complete  ?Social Work Consult for Recovery Care Planning/Counseling Complete  ?Palliative Care Screening Complete  ?Medication Review Oceanographer) Complete  ? ? ?

## 2022-03-28 NOTE — Progress Notes (Signed)
Speech Language Pathology Treatment: Dysphagia  ?Patient Details ?Name: Lauren Henry ?MRN: [id number] ?DOB: 16-Jun-1941 ?Today's Date: 03/28/2022 ?Time: [id number] ?SLP Time Calculation (min) (ACUTE ONLY): 24 min ? ?Assessment / Plan / Recommendation ?Clinical Impression ? Requested by MD to re-evaluate Lauren Henry at bedside and determine potential for advancing diet.  Pt was much more alert today, interactive and conversational.  She demonstrated significant improvements in oral attention and mastication. She fed herself an entire package of graham crackers with prolonged pace but it was quite functional. Sips of thin liquids continued to elicit coughing; this did not occur with nectars. Based upon review of most recent MBS and progress notes, recommend advancing diet to mechanical soft (dysphagia 3); continue nectar thick liquids for now and allow water protocol as previously described (guidelines are posted at [facility]).  ? ?Pt was happy with outcome: "That was some snack!" ? ?SLP will follow. ?  ?HPI HPI: Patient is an 81 y.o. female with PMH: afib, complete heart block s/p pacemaker (not MRI compatable), nonobstructive CAD, CKD stage III who presented to the hospital on 3/23 with left eye swelling. Patient had been recently hospitalized from 3/2 to 3/5 after having syncopal episode in setting of UTI where she was taken off blood pressure medications and treated with antibiotics. She was then hospitalized 3/17-3/19 for FTT symptoms with her husband reporting a progressive decline and at least 30lb weight loss since end of December. There was concern for possible underlying malignany but CT chest, abdomen and pelvis did not show any acute cause for her symptoms. Scans noted an 8 mm noncalcified nodule of the right lower lobe for which repeat CT scan recommended in 2 to 3 months.  She was also noted to have new tremor that was not thought to be parkinsonian in nature with mildly elevated free T4, but levothyroxine dose was  deferred to PCP. In ED, patient was afebrile and vital signs relatively stable, CXR showed no acute disease. On 3/24 RN called into patient's room by husband due to concerns of trouble breathing. Rapid Response was called and patient started on 2L [place] and transfered to unit 38M. on 3/25 patient with increased WOB and CXR showing interstitial changes, possible aspiration. She was intubated at [place] on 3/25 and extubated to 4L via [place] on 3/27 at 1018. ?  ?   ?SLP Plan ? Continue with current plan of care ? ?  ?  ?Recommendations for follow up therapy are one component of a multi-disciplinary discharge planning process, led by the attending physician.  Recommendations may be updated based on patient status, additional functional criteria and insurance authorization. ?  ? ?Recommendations  ?Diet recommendations: Dysphagia 3 (mechanical soft);Nectar-thick liquid ?Liquids provided via: Cup;Straw ?Medication Administration: Whole meds with puree ?Supervision: Patient able to self feed;Staff to assist with self feeding;Full supervision/cueing for compensatory strategies ?Compensations: Minimize environmental distractions ?Postural Changes and/or Swallow Maneuvers: Seated upright 90 degrees  ?   ?    ?   ? ? ? ? Oral Care Recommendations: Oral care BID ?Follow Up Recommendations: Skilled nursing-short term rehab (<3 hours/day) ?Assistance recommended at discharge: Frequent or constant Supervision/Assistance ?SLP Visit Diagnosis: Dysphagia, unspecified (R13.10) ?Plan: Continue with current plan of care ? ? ? ? ?  ? Lauren Dunnaway L. Ladan Vanderzanden, MA CCC/SLP ?Acute Rehabilitation Services ?Office number [phone] ?Pager [phone] ? ? ? ?Lauren Henry ? ?03/28/2022, 4:03 PM ?

## 2022-03-28 NOTE — Progress Notes (Signed)
Manufacturing engineer [facility]) Hospital Liaison note.   ? ?Visited with family at bedside to provide education and information on hospice services in the home.  ? ?Please do not hesitate to call with questions.   ?Thank you,   ?Farrel Gordon, RN, CCM      ?[facility] Hospital Liaison   ?[phone] ?

## 2022-03-28 NOTE — Progress Notes (Signed)
Chaplain visited patient this early evening.  She was asleep and no family present.  Provided Ministry of Presence for patient through silent prayer.   ? ? ? 03/28/22 1800  ?Clinical Encounter Type  ?Visited With Patient  ?Visit Type Follow-up  ?Referral From Family  ?Consult/Referral To Chaplain  ?Spiritual Encounters  ?Spiritual Needs Sacred text;Prayer;Emotional  ?Stress Factors  ?Patient Stress Factors None identified  ?Family Stress Factors Exhausted;Major life changes  ? ? ?Provided quite prayer and ended visit with a departing blessing to staff.   ?

## 2022-03-28 NOTE — Progress Notes (Signed)
Occupational Therapy Treatment ?Patient Details ?Name: Lauren Henry ?MRN: [id number] ?DOB: Mar 07, 1941 ?Today's Date: 03/28/2022 ? ? ?History of present illness 81 y.o female  presents to the ED via ambulance 3/23 with a chief complaint of right arm swelling and left eye swollen x a few days.   Allergic reaction?  PMH:   complete heart block, HTN, Afib on eliquis ?  ?OT comments ? Patient alert this session and very agreeable to OT. Note patient linens and gown saturated with urine as well as pure wick. Patient min A for bed mobility to sit up at edge of bed. With cues to push from bed patient mod A to complete stand pivot to bedside commode, gown changed. RN present at end of session and performed peri care after voiding while OT provide mod A to patient in standing. Mod cues to weight shift forward due to posterior lean. Patient returned to bed at end of session.   ? ?Recommendations for follow up therapy are one component of a multi-disciplinary discharge planning process, led by the attending physician.  Recommendations may be updated based on patient status, additional functional criteria and insurance authorization. ?   ?Follow Up Recommendations ? Skilled nursing-short term rehab (<3 hours/day)  ?  ?Assistance Recommended at Discharge Frequent or constant Supervision/Assistance  ?Patient can return home with the following ? Two people to help with walking and/or transfers;Two people to help with bathing/dressing/bathroom ?  ?Equipment Recommendations ? Hospital bed  ?  ?   ?Precautions / Restrictions Precautions ?Precautions: Fall;Other (comment) ?Precaution Comments: watch HR ?Restrictions ?Weight Bearing Restrictions: No  ? ? ?  ? ?Mobility Bed Mobility ?Overal bed mobility: Needs Assistance ?Bed Mobility: Supine to Sit, Sit to Supine ?  ?  ?Supine to sit: Min assist ?Sit to supine: Min assist ?  ?General bed mobility comments: Patient able to bring her legs to edge of bed and min A to upright trunk. Min A to  safely get legs back onto bed. ?  ? ? ?  ?Balance Overall balance assessment: Needs assistance ?Sitting-balance support: No upper extremity supported, Feet supported ?Sitting balance-Leahy Scale: Fair ?  ?Postural control: Posterior lean ?Standing balance support: Reliant on assistive device for balance ?Standing balance-Leahy Scale: Poor ?  ?  ?  ?  ?  ?  ?  ?  ?  ?  ?  ?  ?   ? ?ADL either performed or assessed with clinical judgement  ? ?ADL Overall ADL's : Needs assistance/impaired ?  ?  ?  ?  ?  ?  ?  ?  ?  ?  ?  ?  ?Toilet Transfer: Moderate assistance;Stand-pivot;Cueing for sequencing;Cueing for safety;Rolling walker (2 wheels);BSC/3in1 ?Toilet Transfer Details (indicate cue type and reason): Patient needing mod A to power up to standing and pivot to/from bedside commode. Verbal cues for safe hand placement to push from surface vs pulling on walker. Patient with posterior lean needing mod cues to weight shift. ?Toileting- Architect and Hygiene: Total assistance;Sit to/from stand ?Toileting - Clothing Manipulation Details (indicate cue type and reason): OT provide mod A to maintain static stand with RN perform peri care after patient voided. ?  ?  ?Functional mobility during ADLs: Moderate assistance;+2 for safety/equipment;Cueing for safety;Cueing for sequencing;Rolling walker (2 wheels) ?General ADL Comments: Patient alert this session, needing verbal cues for pursed lip breathing with activity ?  ? ? ? ?Cognition Arousal/Alertness: Awake/alert ?Behavior During Therapy: [facility] for tasks assessed/performed ?Overall Cognitive Status: Impaired/Different from baseline ?  ?  ?  ?  ?  ?  ?  ?  ?  ?  ?  ?  ?  Following Commands: Follows one step commands with increased time ?Safety/Judgement: Decreased awareness of deficits ?  ?  ?  ?  ?  ?   ?   ?   ?   ? ? ?Pertinent Vitals/ Pain       Pain Assessment ?Pain Assessment: Faces ?Faces Pain Scale: No hurt ? ?   ?   ? ?Frequency ? Min 2X/week  ? ? ? ? ?   ?Progress Toward Goals ? ?OT Goals(current goals can now be found in the care plan section) ? Progress towards OT goals: Progressing toward goals ? ?Acute Rehab OT Goals ?Patient Stated Goal: use commode ?OT Goal Formulation: With patient ?Time For Goal Achievement: 04/01/22 ?Potential to Achieve Goals: Good ?ADL Goals ?Pt Will Perform Grooming: sitting;with min guard assist ?Pt Will Perform Lower Body Bathing: with supervision;sitting/lateral leans;sit to/from stand ?Pt Will Perform Upper Body Dressing: with min assist;sitting ?Pt Will Perform Lower Body Dressing: with mod assist;sit to/from stand;sitting/lateral leans ?Pt Will Transfer to Toilet: with mod assist;stand pivot transfer;bedside commode ?Pt Will Perform Toileting - Clothing Manipulation and hygiene: with modified independence;sitting/lateral leans;sit to/from stand  ?Plan Discharge plan remains appropriate   ? ?   ?AM-PAC OT "6 Clicks" Daily Activity     ?Outcome Measure ? ? Help from another person eating meals?: A Little ?Help from another person taking care of personal grooming?: A Little ?Help from another person toileting, which includes using toliet, bedpan, or urinal?: A Lot ?Help from another person bathing (including washing, rinsing, drying)?: A Lot ?Help from another person to put on and taking off regular upper body clothing?: A Little ?Help from another person to put on and taking off regular lower body clothing?: A Lot ?6 Click Score: 15 ? ?  ?End of Session Equipment Utilized During Treatment: Rolling walker (2 wheels) ? ?OT Visit Diagnosis: Muscle weakness (generalized) (M62.81);Unsteadiness on feet (R26.81);Other abnormalities of gait and mobility (R26.89) ?  ?Activity Tolerance Patient tolerated treatment well ?  ?Patient Left in bed;with call bell/phone within reach;with bed alarm set;with family/visitor present ?  ?Nurse Communication Other (comment) (RN present during session) ?  ? ?   ? ?Time: [phone] ?OT Time Calculation  (min): 25 min ? ?Charges: OT General Charges ?$OT Visit: 1 Visit ?OT Treatments ?$Self Care/Home Management : 23-37 mins ? ?Marlyce Huge OT ?OT pager: [phone] ? ? ?Carmelia Roller ?03/28/2022, 12:56 PM ?

## 2022-03-28 NOTE — Progress Notes (Signed)
?PROGRESS NOTE ? ? ? ?Lauren Henry  HER:[id number] DOB: 12/17/1941 DOA: 03/13/2022 ?PCP: Haywood Pao, MD  ? ? ?Brief Narrative:  ?81 year old female with history of PAF, complete heart block status post pacemaker , nonobstructive CAD, diastolic dysfunction, hypothyroidism, chronic kidney disease stage IIIb, recent multiple hospitalization presented to the ER on 3/23 with left eye swelling for 1 day. ? ?3/2-3/5, admitted to the hospital with syncopal episode in the setting of UTI. ?3/17-3/19, failure to thrive symptoms.  CT scan chest abdomen pelvis negative for malignancy.  Given prednisone on discharge for appetite stimulation. ?3/23, presented back to the hospital with left eye swelling, more slurred speech, neck swelling with difficulty swallowing and intermittent coughing while eating.  Patient noted to be breaking into rashes and progressive swelling of the left eye and difficulty breathing. ?At the emergency room afebrile with stable vital signs.  Creatinine 2.05.  Mildly elevated AST and ALT.  Chest x-ray was normal.  She was given 500 cc of normal saline and admitted to the hospital. ? ?3/24, she was on the medical floor.  Facial swelling worsened and she started having tongue swelling along with tachypnea and difficulty breathing.  She was found with edematous tongue, started on high-dose steroids ,intubated and admitted to the intensive care unit.  Cause unknown for angioedema. ? ?Patient remained in the hospital with fluctuating mental status, poor oral intake and challenging recovery however she did gradual improvement and now able to take oral medications and diet. ?Continue to be seen by palliative and hospice team. ?Currently exploring inpatient therapy options at a skilled nursing facility. ? ?Assessment & Plan: ?  ?Acute metabolic encephalopathy: Profound debility and delirium.  ICU stay and medications. ?Multiple head CT scans without any abnormal findings.  No evidence of  metabolic/electrolyte abnormalities. ?Pacemaker incompatible to MRI. ?Long-term EEG with no evidence of seizures. ?Much clinical improvement today. ?Advance diet.  Will monitor if she can keep up without additional dextrose infusion. ? ?Angioedema rash , conjunctival ecchymosis, neck swelling and edema:  ?Cause unknown.  IgA level with normal limit.  Group a streptococcus negative. ?Treated with high-dose steroids and Pepcid.  All inflammatory symptoms improved.  Inflammation work-up including lupus antibody is negative, ESR and CRP trending down. ?Unknown cause.  Responded well to high-dose steroids.  IgA levels within normal limit.  Group A Streptococcus negative.  Neck swelling improving.  CT scan with no complications. ?Completed steroids.  Continue Pepcid.  Inflammation work-up mostly negative.  ESR and CRP trending down. Skin biopsy right arm 3/25,  ?( Negative for immunoreactive cells as per pathologist)  ?Rashes have improved and resolved. ? ?Acute hypoxemic respiratory failure secondary to above: Intubated to protect airway.  Extubated 3/27 and currently mostly on room air. Treated with Rocephin for presumed aspiration.  Completed antibiotic therapy.   ? ?Dysphagia: Today able to tolerate pur?ed diet with nectar thick liquids. Liberalize diet and advance as tolerated. Encourage oral intake, carb and nutrition intake.  ? ?Anemia of acute blood loss: Received 1 unit of PRBC with appropriate response.  Hemoglobin is stable since initial transfusion. ? ?AKI on CKD stage IIIb: At about her usual levels. ? ?Paroxysmal A-fib, complete heart block status post pacemaker:  ?Stable on metoprolol.  ?Eliquis, increase to 5 mg BID with improvement of renal functions. Tolerating well.  ? ?Hypothyroidism: Synthroid replacement on high doses.  Continue. ? ? ?Goal of care: ?progressive weakness, generalized weakness, failure to thrive, recurrent hospitalizations.  ?Patient with progressive failure to thrive, poor  appetite  for at least 3 months now. ?Multiple investigations including CT scan of the neck, CT scan of the chest abdomen pelvis with no evidence of structural malignancy. Recent investigations including S11, folic acid, TSH, copper, methylmalonic acid levels were normal. Electrolytes including magnesium, phosphorus is normal.  Lupus antibody negative. Seen by neurology, do not think any spinal cord disease. ? ?4/6-4/7,  improved wakefulness. Multiple palliative and hospice discussions,  ?4/7, goal of care meeting, hospice / palliative care , all family members at bedside .  ? ?Due to patient clinical improvement , exploring SNF with palliative care follow up. ?MOST form given for education, recommended DNR with comfort care options.  Family will discuss. ? ? ? ?DVT prophylaxis:  eliquis  ?apixaban (ELIQUIS) tablet 5 mg  ? ?Code Status: Full code. ?Family Communication: Patient's husband , son, daughter, grandson and good friend Ms. Hassan Rowan at bedside. ?Disposition Plan: Status is: Inpatient ?Remains inpatient appropriate because: Unsafe disposition plan. ?  ? ? ?Consultants:  ?Neurology ?Critical care ?Palliative care ? ?Procedures:  ?Multiple as above. ? ?Antimicrobials:  ?Completed antibiotics. ? ? ?Subjective: ? ?Patient seen and examined.  No overnight events.  Multiple meetings with the family as above.  Patient herself denies any complaints.  She tells me she ate half of the meal, her husband tells me that she ate half of one portion of the meal.  Keeping up blood sugar levels after discontinuing dextrose drip.  Heart rate remains stable. ? ? ?Objective: ?Vitals:  ? 03/28/22 0020 03/28/22 0455 03/28/22 0832 03/28/22 1213  ?BP: 110/69 115/87 (!) 133/91 (!) 148/101  ?Pulse: (!) 101 82 89   ?Resp: _0 ?Temp: 98.1 ?F (36.7 ?C) 98.3 ?F (36.8 ?C)    ?TempSrc: Oral Oral    ?SpO2: 98% 97% 96% 97%  ?Weight:  75.5 kg    ?Height:      ? ? ?Intake/Output Summary (Last 24 hours) at 03/28/2022 1500 ?Last data filed at 03/28/2022  0026 ?Gross per 24 hour  ?Intake --  ?Output 600 ml  ?Net -600 ml  ? ? ?Filed Weights  ? 03/26/22 0400 03/27/22 0500 03/28/22 0455  ?Weight: 78 kg 77.4 kg 75.5 kg  ? ? ?Examination:  ? ?General: Looks fairly comfortable today.  Chronically sick looking but comfortable, not in any acute distress, full of sense of humor today. On room air. ?Alert and oriented x4.  Moves all extremities.  Generalized weakness 4/5. ?Cardiovascular: S1-S2 normal.  Irregularly irregular.  Pacemaker in place. ?Respiratory: Bilateral clear. On room air. ?Gastrointestinal: Soft.  Nontender.  Bowel sound present. ?Ext: No swelling or edema.  No cyanosis.  No clubbing. ?Neuro: Alert and oriented. generalized weakness but no focal deficits. ?Musculoskeletal: No deformities. ? ? ? ? ? ? ? ?Data Reviewed: I have personally reviewed following labs and imaging studies ? ?CBC: ?Recent Labs  ?Lab 03/22/22 ?0112 03/25/22 ?1552  ?WBC 15.0* 13.9*  ?NEUTROABS 13.2* 11.3*  ?HGB 11.9* 12.3  ?HCT 38.5 39.1  ?MCV 92.5 92.2  ?PLT 182 148*  ? ?Basic Metabolic Panel: ?Recent Labs  ?Lab 03/22/22 ?0112 03/25/22 ?0802  ?NA 140 138  ?K 4.4 4.4  ?CL 104 106  ?CO2 27 22  ?GLUCOSE 114* 86  ?BUN 61* 43*  ?CREATININE 1.20* 1.11*  ?CALCIUM 9.5 9.7  ?MG 1.7 1.7  ?PHOS 4.5 3.3  ? ?GFR: ?Estimated Creatinine Clearance: 38.5 mL/min (A) (by C-G formula based on SCr of 1.11 mg/dL (H)). ?Liver Function Tests: ?Recent Labs  ?Lab  03/25/22 ?3612  ?AST 26  ?ALT 53*  ?ALKPHOS 64  ?BILITOT 0.6  ?PROT 6.3*  ?ALBUMIN 2.5*  ? ? ?No results for input(s): LIPASE, AMYLASE in the last 168 hours. ?Recent Labs  ?Lab 03/25/22 ?1056  ?AMMONIA 17  ? ?Coagulation Profile: ?No results for input(s): INR, PROTIME in the last 168 hours. ? ?Cardiac Enzymes: ?No results for input(s): CKTOTAL, CKMB, CKMBINDEX, TROPONINI in the last 168 hours. ? ?BNP (last 3 results) ?No results for input(s): PROBNP in the last 8760 hours. ?HbA1C: ?No results for input(s): HGBA1C in the last 72 hours. ?CBG: ?Recent Labs   ?Lab 03/27/22 ?2050 03/28/22 ?0024 03/28/22 ?2449 03/28/22 ?7530 03/28/22 ?1212  ?GLUCAP 114* 78 99 105* 113*  ? ?Lipid Profile: ?No results for input(s): CHOL, HDL, LDLCALC, TRIG, CHOLHDL, LDLDIRECT in the last 72 hour

## 2022-03-28 NOTE — Progress Notes (Signed)
Brief Nutrition Note ? ?New consult received for assessment of nutrition status. Pt is being followed by nutrition team with initial assessment on 3/24. Nutrition interventions are in place. For more details, see most recent follow-up 4/6. Will follow-up as planned. Noted pt/family met with hospice this AM. Will add additional supplements (Nepro due to need for thickened liquids) as an additional source of nutrition. ? ?Ranell Patrick, RD, LDN ?Clinical Dietitian ?RD pager # available in [place]  ?After hours/weekend pager # available in [place] ?

## 2022-03-29 DIAGNOSIS — Z7189 Other specified counseling: Secondary | ICD-10-CM | POA: Diagnosis not present

## 2022-03-29 DIAGNOSIS — Z515 Encounter for palliative care: Secondary | ICD-10-CM | POA: Diagnosis not present

## 2022-03-29 LAB — GLUCOSE, CAPILLARY
Glucose-Capillary: 88 mg/dL (ref 70–99)
Glucose-Capillary: 83 mg/dL (ref 70–99)
Glucose-Capillary: 85 mg/dL (ref 70–99)
Glucose-Capillary: 96 mg/dL (ref 70–99)

## 2022-03-29 MED ORDER — METOPROLOL TARTRATE 12.5 MG HALF TABLET
12.5000 mg | ORAL_TABLET | Freq: Two times a day (BID) | ORAL | Status: DC
  Administered 2022-03-29 – 2022-03-31 (×4): 12.5 mg via ORAL
  Filled 2022-03-29 (×4): qty 1

## 2022-03-29 MED ORDER — APIXABAN 2.5 MG PO TABS
2.5000 mg | ORAL_TABLET | Freq: Two times a day (BID) | ORAL | Status: DC
  Administered 2022-03-29 – 2022-03-31 (×4): 2.5 mg via ORAL
  Filled 2022-03-29 (×4): qty 1

## 2022-03-29 MED ORDER — POLYVINYL ALCOHOL 1.4 % OP SOLN: 1.0000 [drp] | OPHTHALMIC | 0 refills | Status: DC | PRN

## 2022-03-29 MED ORDER — NYSTATIN 100000 UNIT/ML MT SUSP: 5.0000 mL | Freq: Four times a day (QID) | OROMUCOSAL | 0 refills | Status: DC

## 2022-03-29 MED ORDER — DRONABINOL 2.5 MG PO CAPS: 2.5000 mg | ORAL_CAPSULE | Freq: Two times a day (BID) | ORAL | 0 refills | Status: AC

## 2022-03-29 NOTE — TOC Progression Note (Signed)
Transition of Care (TOC) - Progression Note  ? ? ?Patient Details  ?Name: Lauren Henry ?MRN: [id number] ?Date of Birth: [birth year] ? ?Transition of Care (TOC) CM/SW Contact  ?[place], LCSWA ?Phone Number: ?03/29/2022, 2:33 PM ? ?Clinical Narrative:    ? ?CSW spoke with pt's spouse by phone and provided bed offers, he will make a decision by Monday.  ? ?Expected Discharge Plan: [place] ?Barriers to Discharge: Continued Medical Work up ? ?Expected Discharge Plan and Services ?Expected Discharge Plan: [place] ?In-house Referral: Clinical Social Work ?  ?  ?Living arrangements for the past 2 months: [place] ?                ?  ?  ?  ?  ?  ?  ?  ?  ?  ?  ? ? ?Social Determinants of Health (SDOH) Interventions ?  ? ?Readmission Risk Interventions ? ?  03/27/2022  ?  9:57 AM  ?Readmission Risk Prevention Plan  ?Transportation Screening Complete  ?PCP or Specialist Appt within 3-5 Days Complete  ?[place] or Home Care Consult Complete  ?Social Work Consult for [place] Planning/Counseling Complete  ?Palliative Care Screening Complete  ?Medication Review Press photographer) Complete  ? ? ?

## 2022-03-29 NOTE — Progress Notes (Addendum)
? ?Palliative Medicine Inpatient Follow Up Note ? ?HPI: ?81 year old female with history of PAF, complete heart block status post pacemaker which is not compatible to MRI, nonobstructive CAD, diastolic dysfunction, hypothyroidism, chronic kidney disease stage IIIb, recent multiple hospitalization presented to the ER on 3/23 with left eye swelling for 1 day. 3/2-3/5, admitted to the hospital with syncopal episode in the setting of UTI. 3/17-3/19, failure to thrive symptoms.  CT scan chest abdomen pelvis negative for malignancy.  Given prednisone on discharge for appetite stimulation. 3/23, presented back to the hospital with left eye swelling, more slurred speech, neck swelling with difficulty swallowing and intermittent coughing while eating.  Patient noted to be breaking into rashes and progressive swelling of the left eye and difficulty breathing. At the emergency room afebrile with stable vital signs.  Creatinine 2.05.  Mildly elevated AST and ALT.  Chest x-ray was normal.  She was given 500 cc of normal saline and admitted to the hospital. 3/24, she was on the medical floor.  Facial swelling worsened and she started having tongue swelling along with tachypnea and difficulty breathing.  She was found with edematous tongue, started on high-dose steroids , intubated and admitted to the intensive care unit. ?  ?Palliative care has been asked to get involved to further address goals of care. ? ?Today's Discussion (03/29/2022): ? ?*Please note that this is a verbal dictation therefore any spelling or grammatical errors are due to the "[place] [facility]" system interpretation. ? ?Chart reviewed inclusive of vital signs, progress notes, laboratory results, and diagnostic images.  ? ?I met with Lauren Henry at bedside this morning. She is awake and alert. She shares with me that she would like to get out of bed and use the commode. She was able to get up to the bedside commode with 1P assistance, afterwards she was set in the  recliner by myself. She was able to stand and mobilize 5 steps to chair. She shares that she "needs to start eating better" if she wants to go home. Discussed she is now on an appetite enhancing medication. We reviewed the plan for rehabilitation which she is aware of. ? ?Questions and concerns addressed  ? ?Palliative Support Provided ? ?Objective Assessment: ?Vital Signs ?Vitals:  ? 03/29/22 0342 03/29/22 0754  ?BP: (!) 106/50 117/84  ?Pulse: 66 81  ?Resp: 20 17  ?Temp: 97.9 ?F (36.6 ?C) 97.9 ?F (36.6 ?C)  ?SpO2: 94% 95%  ? ?No intake or output data in the 24 hours ending 03/29/22 0904 ? ?Last Weight  Most recent update: 03/28/2022  5:01 AM  ? ? Weight  ?75.5 kg (166 lb 7.2 oz)  ?      ? ?  ? ?Gen: Elderly Caucasian female in no acute distress ?HEENT: Moist mucous membranes ?CV: Regular rate and irregular rhythm ?PULM: On room air ?ABD: soft/nontender ?EXT: No edema ?Neuro: A+O x3 ? ?SUMMARY OF RECOMMENDATIONS   ?DNAR ?  ?Continue to treat what is treatable ?  ?For poor appetite will start --> Marinol 2.73m PO BID ? ?TOC - OP Palliative support on discharge ? ?Plan for skilled nursing facility ? ?Ongoing support ? ?MDM - Moderate ?______________________________________________________________________________________ ?MTacey Ruiz?[facility] [place] ?Team Cell Phone: [phone]?Please utilize secure chat with additional questions, if there is no response within 30 minutes please call the above phone number ? ?Palliative Medicine Team providers are available by phone from 7am to 7pm daily and can be reached through the team cell phone.  ?Should  this patient require assistance outside of these hours, please call the patient's attending physician. ? ? ? ? ?

## 2022-03-29 NOTE — Progress Notes (Signed)
?PROGRESS NOTE ? ? ? ?Lauren Henry  YWV:[id number] DOB: [birth year] DOA: 03/13/2022 ?PCP: Haywood Pao, MD  ? ? ?Brief Narrative:  ?81 year old female with history of PAF, complete heart block status post pacemaker , nonobstructive CAD, diastolic dysfunction, hypothyroidism, chronic kidney disease stage IIIb, recent multiple hospitalization presented to the ER on 3/23 with left eye swelling for 1 day. ? ?Recent multiple hospitalizations.  Complicated hospital course with some clinical improvement now.  See chronology of events below.  ? ?3/2-3/5, admitted to the hospital with syncopal episode in the setting of UTI. ?3/17-3/19, failure to thrive symptoms.  CT scan chest abdomen pelvis negative for malignancy.  Given prednisone on discharge for appetite stimulation. ?3/23, presented back to the hospital with left eye swelling, more slurred speech, neck swelling with difficulty swallowing and intermittent coughing while eating.  Patient noted to be breaking into rashes and progressive swelling of the left eye and difficulty breathing. At the emergency room afebrile with stable vital signs.  Creatinine 2.05.  Mildly elevated AST and ALT.  Chest x-ray was normal.  She was given 500 cc of normal saline and admitted to the hospital. She was on the medical floor.  Facial swelling worsened and she started having tongue swelling along with tachypnea and difficulty breathing.  She was found with edematous tongue, started on high-dose steroids ,intubated and admitted to the intensive care unit.  Cause unknown for angioedema. ? ?Patient remained in the hospital with fluctuating mental status, poor oral intake and challenging recovery however she did gradual improvement and now able to take oral medications and diet. ?Continue to be seen by palliative and hospice team. ?Currently exploring inpatient therapy options at a skilled nursing facility. ? ?Assessment & Plan: ?  ?Acute metabolic encephalopathy: Profound debility and  delirium.  ICU stay and medications. ?Multiple head CT scans without any abnormal findings.  No evidence of metabolic/electrolyte abnormalities. ?Pacemaker incompatible to MRI. ?Long-term EEG with no evidence of seizures. ?Much clinical improvement now. Advance diet and activities. ? ?Angioedema rash , conjunctival ecchymosis, neck swelling and edema:  ?Cause unknown.  IgA level with normal limit.  Group a streptococcus negative. ?Treated with high-dose steroids and Pepcid.  All inflammatory symptoms improved.  Inflammation work-up including lupus antibody is negative, ESR and CRP trending down. ?Responded well to high-dose steroids.  Skin biopsy right arm 3/25 negative for any immunoreactive cells.  Rashes improved and resolved. ? ?Acute hypoxemic respiratory failure secondary to above: Now on room air. ? ?Dysphagia: Continues to gradually improve.  Currently on mechanical soft diet with nectar thick liquid.  Will need ongoing follow-up, hopefully she can go on regular liquids next few days.   ? ?Anemia of acute blood loss: Received 1 unit of PRBC with appropriate response.  Hemoglobin is stable since initial transfusion. ? ?AKI on CKD stage IIIb: At about her usual levels. ? ?Paroxysmal A-fib, complete heart block status post pacemaker:  ?Stable on metoprolol.  Fatigue on Eliquis.  Tolerating well. ? ?Hypothyroidism: Synthroid replacement on high doses.  Continue. ? ? ?Goal of care: ?Patient remained prolonged encephalopathy, progressive failure to thrive and poor appetite.  Multiple investigations negative for malignancy.  This was thought to be from advanced debility and progressive weakness.   ?Electrolytes, W54, folic acid, TSH, copper, methylmalonic acid were normal.   ?Lupus antibody was negative. ? ?4/6-4/7,  improved wakefulness. Multiple palliative and hospice discussions,  ?4/7, goal of care meeting, hospice / palliative care , all family members at bedside .  ? ?  Due to patient clinical improvement ,  exploring SNF with palliative care follow up. ?4/8, much clinical improvement.  Able to keep up with at least 50% of the meal.  Waiting for SNF bed offers. ?MOST form filled by patient's husband with children acknowledging. ?DNR/DNI with comfort care options if further deterioration or decompensation at nursing home. ? ? ? ?DVT prophylaxis:  eliquis  ?apixaban (ELIQUIS) tablet 5 mg  ? ?Code Status: Full code. ?Family Communication: Husband at the bedside. ?Disposition Plan: Status is: Inpatient ?Remains inpatient appropriate because: SNF pending. ?  ? ? ?Consultants:  ?Neurology ?Critical care ?Palliative care ?Hospice. ? ?Procedures:  ?Multiple as above. ? ?Antimicrobials:  ?Completed antibiotics. ? ? ?Subjective: ? ?Patient seen and examined.  Sitting in chair.  Needed 1 person assist to get out of the bed to the chair.  She denies any complaints.  Husband stated she slept fairly well last night.  No more episodes of disorientation or confusion at night. ?Patient tells me that she is working on improving her oral intake.  She is hoping to go on clear liquid diet so that she can enjoy the liquids.  No difficulties with swallowing food. ? ? ?Objective: ?Vitals:  ? 03/28/22 2306 03/28/22 2330 03/29/22 0342 03/29/22 0754  ?BP: (!) 136/104 (!) 107/56 (!) 106/50 117/84  ?Pulse: 75 68 66 81  ?Resp: 18  20 17   ?Temp: 97.9 ?F (36.6 ?C)  97.9 ?F (36.6 ?C) 97.9 ?F (36.6 ?C)  ?TempSrc: Axillary  Oral Oral  ?SpO2: 95% 97% 94% 95%  ?Weight:      ?Height:      ? ?No intake or output data in the 24 hours ending 03/29/22 1150 ? ? ?Filed Weights  ? 03/26/22 0400 03/27/22 0500 03/28/22 0455  ?Weight: 78 kg 77.4 kg 75.5 kg  ? ? ?Examination:  ? ?General: Frail and debilitated.  Comfortable at rest. ?She is alert oriented x3-4.  She does not have any focal deficits.  She is generalized weak.  Her mentation has been clear.  There is no evidence of delirium or disorientation. ?Cardiovascular: S1-S2 normal.  Irregular rate rhythm.  Heart  rate is controlled.  She has a pacemaker in place. ?Respiratory: Bilateral clear.  No added sounds. ?Gastrointestinal: Soft.  Nontender.  Bowel sound present. ?Ext: No edema.  No swelling.  No cyanosis. ?Neuro: Intact with generalized weakness. ? ? ? ? ? ? ? ? ?Data Reviewed: I have personally reviewed following labs and imaging studies ? ?CBC: ?Recent Labs  ?Lab 03/25/22 ?1219  ?WBC 13.9*  ?NEUTROABS 11.3*  ?HGB 12.3  ?HCT 39.1  ?MCV 92.2  ?PLT 148*  ? ? ?Basic Metabolic Panel: ?Recent Labs  ?Lab 03/25/22 ?7588  ?NA 138  ?K 4.4  ?CL 106  ?CO2 22  ?GLUCOSE 86  ?BUN 43*  ?CREATININE 1.11*  ?CALCIUM 9.7  ?MG 1.7  ?PHOS 3.3  ? ? ?GFR: ?Estimated Creatinine Clearance: 38.5 mL/min (A) (by C-G formula based on SCr of 1.11 mg/dL (H)). ?Liver Function Tests: ?Recent Labs  ?Lab 03/25/22 ?3254  ?AST 26  ?ALT 53*  ?ALKPHOS 64  ?BILITOT 0.6  ?PROT 6.3*  ?ALBUMIN 2.5*  ? ? ? ?No results for input(s): LIPASE, AMYLASE in the last 168 hours. ?Recent Labs  ?Lab 03/25/22 ?1056  ?AMMONIA 17  ? ? ?Coagulation Profile: ?No results for input(s): INR, PROTIME in the last 168 hours. ? ?Cardiac Enzymes: ?No results for input(s): CKTOTAL, CKMB, CKMBINDEX, TROPONINI in the last 168 hours. ? ?BNP (last 3  results) ?No results for input(s): PROBNP in the last 8760 hours. ?HbA1C: ?No results for input(s): HGBA1C in the last 72 hours. ?CBG: ?Recent Labs  ?Lab 03/28/22 ?1212 03/28/22 ?2001 03/28/22 ?2335 03/29/22 ?1660 03/29/22 ?0759  ?GLUCAP 113* 99 115* 88 83  ? ? ?Lipid Profile: ?No results for input(s): CHOL, HDL, LDLCALC, TRIG, CHOLHDL, LDLDIRECT in the last 72 hours. ? ?Thyroid Function Tests: ?No results for input(s): TSH, T4TOTAL, FREET4, T3FREE, THYROIDAB in the last 72 hours. ?Anemia Panel: ?No results for input(s): VITAMINB12, FOLATE, FERRITIN, TIBC, IRON, RETICCTPCT in the last 72 hours. ?Sepsis Labs: ?No results for input(s): PROCALCITON, LATICACIDVEN in the last 168 hours. ? ?No results found for this or any previous visit (from the  past 240 hour(s)). ? ?  ? ? ? ? ? ?Radiology Studies: ?No results found. ? ? ? ? ? ?Scheduled Meds: ? sodium chloride   Intravenous Once  ? apixaban  5 mg Oral BID  ? chlorhexidine gluconate (MEDLINE KIT)

## 2022-03-29 NOTE — TOC Progression Note (Signed)
Transition of Care (TOC) - Progression Note  ? ? ?Patient Details  ?Name: Lauren Henry ?MRN: [id number] ?Date of Birth: [birth year] ? ?Transition of Care (TOC) CM/SW Contact  ?Akacia Boltz B Ellison Rieth, LCSWA ?Phone Number: ?03/29/2022, 10:30 AM ? ?Clinical Narrative:    ? ?CSW reached out to pt's spouse to discuss bed offers, no answer, not present in room. CSW left a vm, RN stated she will let CSW know if he comes.  ? ?Expected Discharge Plan: Home w Hospice Care ?Barriers to Discharge: Continued Medical Work up ? ?Expected Discharge Plan and Services ?Expected Discharge Plan: Home w Hospice Care ?In-house Referral: Clinical Social Work ?  ?  ?Living arrangements for the past 2 months: Single Family Home ?                ?  ?  ?  ?  ?  ?  ?  ?  ?  ?  ? ? ?Social Determinants of Health (SDOH) Interventions ?  ? ?Readmission Risk Interventions ? ?  03/27/2022  ?  9:57 AM  ?Readmission Risk Prevention Plan  ?Transportation Screening Complete  ?PCP or Specialist Appt within 3-5 Days Complete  ?[place] or Home Care Consult Complete  ?Social Work Consult for [place] Care Planning/Counseling Complete  ?Palliative Care Screening Complete  ?Medication Review Oceanographer) Complete  ? ? ?

## 2022-03-29 NOTE — Plan of Care (Signed)

## 2022-03-29 NOTE — Discharge Planning (Signed)
Mr Duhamel discussed with me again in the evening about his, patient's and family wishes to go home with home health services. He did not like to send her to SNF as provided in the list and patient continues to do better everyday and he feels he is equipped enough to help her at home . Agreed with patient's desire to go home. Will sign PT/OT/RN/Speech therapy to work at home with him. Will need palliative care follow up as she is not yet enrolling into hospice. I told him to see progress today and possibly can go home tomorrow. Signed orders.  ? ? ?

## 2022-03-30 DIAGNOSIS — Z7189 Other specified counseling: Secondary | ICD-10-CM | POA: Diagnosis not present

## 2022-03-30 DIAGNOSIS — Z515 Encounter for palliative care: Secondary | ICD-10-CM | POA: Diagnosis not present

## 2022-03-30 DIAGNOSIS — H5789 Other specified disorders of eye and adnexa: Secondary | ICD-10-CM | POA: Diagnosis not present

## 2022-03-30 LAB — GLUCOSE, CAPILLARY
Glucose-Capillary: 76 mg/dL (ref 70–99)
Glucose-Capillary: 80 mg/dL (ref 70–99)
Glucose-Capillary: 85 mg/dL (ref 70–99)
Glucose-Capillary: 86 mg/dL (ref 70–99)
Glucose-Capillary: 88 mg/dL (ref 70–99)
Glucose-Capillary: 88 mg/dL (ref 70–99)
Glucose-Capillary: 97 mg/dL (ref 70–99)

## 2022-03-30 NOTE — Progress Notes (Signed)
1D[facility] Liaison Note ? ?Notified by TOC /D. Swist, RN of patient/family request of [facility] Paliative services. ? ?[facility] hospital liaison will follow patient for discharge disposition.  ? ?Please call with any questions/concerns.  ?  ?Thank you for the opportunity to participate in this patient's care. ?  ?Odette Fraction, MSW ?[facility] Liaison  ?[phone] ? ?

## 2022-03-30 NOTE — Progress Notes (Signed)
?PROGRESS NOTE ? ? ? ?Lauren Henry  MVE:[id number] DOB: 06/14/1941 DOA: 03/13/2022 ?PCP: Haywood Pao, MD  ? ? ?Brief Narrative:  ?81 year old female with history of PAF, complete heart block status post pacemaker , nonobstructive CAD, diastolic dysfunction, hypothyroidism, chronic kidney disease stage IIIb, recent multiple hospitalization presented to the ER on 3/23 with left eye swelling for 1 day. ? ?Recent multiple hospitalizations.  Complicated hospital course with some clinical improvement now.  See chronology of events below.  ? ?3/2-3/5, admitted to the hospital with syncopal episode in the setting of UTI. ?3/17-3/19, failure to thrive symptoms.  CT scan chest abdomen pelvis negative for malignancy.  Given prednisone on discharge for appetite stimulation. ?3/23, presented back to the hospital with left eye swelling, more slurred speech, neck swelling with difficulty swallowing and intermittent coughing while eating.  Patient noted to be breaking into rashes and progressive swelling of the left eye and difficulty breathing. At the emergency room afebrile with stable vital signs.  Creatinine 2.05.  Mildly elevated AST and ALT.  Chest x-ray was normal.  She was given 500 cc of normal saline and admitted to the hospital. She was on the medical floor.  Facial swelling worsened and she started having tongue swelling along with tachypnea and difficulty breathing.  She was found with edematous tongue, started on high-dose steroids ,intubated and admitted to the intensive care unit.  Cause unknown for angioedema. ? ?Patient remained in the hospital with fluctuating mental status, poor oral intake and challenging recovery however she did gradual improvement and now able to take oral medications and diet. ?Continue to be seen by palliative and hospice team. ?Currently exploring inpatient therapy options at a skilled nursing facility. ?4/9 DME and all setup for tomorrow. Pt sleepy no complaints. ? ?Assessment &  Plan: ?  ?Acute metabolic encephalopathy: Profound debility and delirium.  ICU stay and medications. ?Multiple head CT scans without any abnormal findings.  No evidence of metabolic/electrolyte abnormalities. ?Pacemaker incompatible to MRI. ?Long-term EEG with no evidence of seizures. ?4/10 clinical improvement. Advance diet and activity as tolerated ? ? ?Angioedema rash , conjunctival ecchymosis, neck swelling and edema:  ?Cause unknown.  IgA level with normal limit.  Group a streptococcus negative. ?Treated with high-dose steroids and Pepcid.  All inflammatory symptoms improved.  Inflammation work-up including lupus antibody is negative, ESR and CRP trending down. ?Responded well to high-dose steroids.  Skin biopsy right arm 3/25 negative for any immunoreactive cells.  ?4/10 rash improved and resolved ? ? ?Acute hypoxemic respiratory failure secondary to above: ?Now on RA ? ?Anemia of acute blood loss: Received 1 unit of PRBC with appropriate response. ?4/10 h/h stable  ? ? ? ?Dysphagia: Continues to gradually improve.  Currently on mechanical soft diet with nectar thick liquid.  Will need ongoing follow-up, hopefully she can go on regular liquids next few days.   ? ? ? ?AKI on CKD stage IIIb: At about her usual levels. ? ?Paroxysmal A-fib, complete heart block status post pacemaker:  ?Stable on metoprolol.  Fatigue on Eliquis.  Tolerating well. ? ?Hypothyroidism: Synthroid replacement on high doses.  Continue. ? ? ?Goal of care: ?Patient remained prolonged encephalopathy, progressive failure to thrive and poor appetite.  Multiple investigations negative for malignancy.  This was thought to be from advanced debility and progressive weakness.   ?Electrolytes, G83, folic acid, TSH, copper, methylmalonic acid were normal.   ?Lupus antibody was negative. ? ?4/6-4/7,  improved wakefulness. Multiple palliative and hospice discussions,  ?4/7, goal  of care meeting, hospice / palliative care , all family members at bedside  .  ? ?Due to patient clinical improvement , exploring SNF with palliative care follow up. ?4/8, much clinical improvement.  Able to keep up with at least 50% of the meal.  Waiting for SNF bed offers. ?MOST form filled by patient's husband with children acknowledging. ?DNR/DNI with comfort care options if further deterioration or decompensation at nursing home. ?4/10 no overnight issues.  ? ? ? ?DVT prophylaxis: apixaban (ELIQUIS) tablet 2.5 mg Start: 03/29/22 2200 eliquis  ?apixaban (ELIQUIS) tablet 2.5 mg  ? ?Code Status: DNR ?Family Communication:  ?Disposition Plan: Status is: Inpatient ?Remains inpatient appropriate because: SNF pending. ?  ? ? ?Consultants:  ?Neurology ?Critical care ?Palliative care ?Hospice. ? ?Procedures:  ?Multiple as above. ? ?Antimicrobials:  ?Completed antibiotics. ? ? ?Subjective: ? ?Sleepy no complaints.  ? ? ?Objective: ?Vitals:  ? 03/29/22 1900 03/30/22 0018 03/30/22 0321 03/30/22 0810  ?BP: (!) 144/91 107/76 135/89 137/89  ?Pulse: 66 97 100 60  ?Resp: _0 ?Temp: 97.8 ?F (36.6 ?C) 98.1 ?F (36.7 ?C) 97.9 ?F (36.6 ?C) 98.3 ?F (36.8 ?C)  ?TempSrc: Oral Oral Oral Oral  ?SpO2: 98% 97% 98% 96%  ?Weight:      ?Height:      ? ? ?Intake/Output Summary (Last 24 hours) at 03/30/2022 0910 ?Last data filed at 03/30/2022 0600 ?Gross per 24 hour  ?Intake 150 ml  ?Output --  ?Net 150 ml  ? ? ?Filed Weights  ? 03/26/22 0400 03/27/22 0500 03/28/22 0455  ?Weight: 78 kg 77.4 kg 75.5 kg  ? ? ?Examination:  ?Calm, NAD ?Cta no w/r ?Reg s1/s2 no gallop ?Soft benign +bs ?No edema ?Grossly intact, sleepy ?Mood and affect appropriate in current setting  ? ? ? ? ? ? ? ? ?Data Reviewed: I have personally reviewed following labs and imaging studies ? ?CBC: ?Recent Labs  ?Lab 03/25/22 ?4742  ?WBC 13.9*  ?NEUTROABS 11.3*  ?HGB 12.3  ?HCT 39.1  ?MCV 92.2  ?PLT 148*  ? ?Basic Metabolic Panel: ?Recent Labs  ?Lab 03/25/22 ?5956  ?NA 138  ?K 4.4  ?CL 106  ?CO2 22  ?GLUCOSE 86  ?BUN 43*  ?CREATININE 1.11*  ?CALCIUM  9.7  ?MG 1.7  ?PHOS 3.3  ? ?GFR: ?Estimated Creatinine Clearance: 38.5 mL/min (A) (by C-G formula based on SCr of 1.11 mg/dL (H)). ?Liver Function Tests: ?Recent Labs  ?Lab 03/25/22 ?3875  ?AST 26  ?ALT 53*  ?ALKPHOS 64  ?BILITOT 0.6  ?PROT 6.3*  ?ALBUMIN 2.5*  ? ? ?No results for input(s): LIPASE, AMYLASE in the last 168 hours. ?Recent Labs  ?Lab 03/25/22 ?1056  ?AMMONIA 17  ? ?Coagulation Profile: ?No results for input(s): INR, PROTIME in the last 168 hours. ? ?Cardiac Enzymes: ?No results for input(s): CKTOTAL, CKMB, CKMBINDEX, TROPONINI in the last 168 hours. ? ?BNP (last 3 results) ?No results for input(s): PROBNP in the last 8760 hours. ?HbA1C: ?No results for input(s): HGBA1C in the last 72 hours. ?CBG: ?Recent Labs  ?Lab 03/29/22 ?1152 03/29/22 ?1615 03/30/22 ?0026 03/30/22 ?0319 03/30/22 ?6433  ?GLUCAP 85 96 88 76 86  ? ?Lipid Profile: ?No results for input(s): CHOL, HDL, LDLCALC, TRIG, CHOLHDL, LDLDIRECT in the last 72 hours. ? ?Thyroid Function Tests: ?No results for input(s): TSH, T4TOTAL, FREET4, T3FREE, THYROIDAB in the last 72 hours. ?Anemia Panel: ?No results for input(s): VITAMINB12, FOLATE, FERRITIN, TIBC, IRON, RETICCTPCT in the last 72 hours. ?Sepsis Labs: ?No results  for input(s): PROCALCITON, LATICACIDVEN in the last 168 hours. ? ?No results found for this or any previous visit (from the past 240 hour(s)). ? ?  ? ? ? ? ? ?Radiology Studies: ?No results found. ? ? ? ? ? ?Scheduled Meds: ? sodium chloride   Intravenous Once  ? apixaban  2.5 mg Oral BID  ? chlorhexidine gluconate (MEDLINE KIT)  15 mL Mouth Rinse BID  ? dronabinol  2.5 mg Oral BID AC  ? famotidine  20 mg Oral Daily  ? levothyroxine  150 mcg Oral Once per day on Sun Mon Wed Thu Fri  ? levothyroxine  225 mcg Oral Once per day on Tue Sat  ? mouth rinse  15 mL Mouth Rinse q12n4p  ? metoprolol tartrate  12.5 mg Oral BID  ? multivitamin with minerals  1 tablet Oral Daily  ? nystatin  5 mL Oral QID  ? sodium chloride flush  3 mL  Intravenous Q12H  ? ?Continuous Infusions: ? sodium chloride Stopped (03/18/22 1659)  ? ? ? LOS: 16 days  ? ? ?Time spent: 35 minutes ? ? ? ?Nolberto Hanlon, MD ?[facility] ?Pager [phone] ? ?

## 2022-03-30 NOTE — TOC Transition Note (Addendum)
Transition of Care (TOC) - CM/SW Discharge Note ? ? ?Patient Details  ?Name: Lauren Henry ?MRN: [id number] ?Date of Birth: 1941-01-06 ? ?Transition of Care (TOC) CM/SW Contact:  ?Carles Collet, RN ?Phone Number: ?03/30/2022, 8:15 AM ? ? ?Clinical Narrative:    ?Upon review of notes, [facility] and [facility] active. ?Notified [facility] of DC today with outpatient palliative services. ?Notified [facility] of DC tomorrow (due to DME delivery)  with [facility] services for PT OT RN SLP.  ?Patient has RW, 3/1, WC, cane at home  ?Spoke with spouse and he has identified patient will need a hospital bed to go home, as well as non emergency transport. ?Will order bed to be delivered to the home through [employer]. Anticipate delivery will be Monday.  ?Will fill out PTAR forms and place on chart.  ? ? ? ?Final next level of care: [place] ?Barriers to Discharge: No Barriers Identified ? ? ?Patient Goals and CMS Choice ?Patient states their goals for this hospitalization and ongoing recovery are:: Rehab ?CMS Medicare.gov Compare Post Acute Care list provided to:: Patient ?Choice offered to / list presented to : Patient, Spouse ? ?Discharge Placement ?  ?           ?  ?  ?  ?  ? ?Discharge Plan and Services ?In-house Referral: Clinical Social Work ?  ?           ?  ?  ?  ?  ?  ?HH Arranged: PT, OT, Speech Therapy, RN ?[place] Agency: [facility] ?Date HH Agency Contacted: 03/30/22 ?Time [place]: 0815 ?Representative spoke with at [place]: Delsa Sale ? ?Social Determinants of Health (SDOH) Interventions ?  ? ? ?Readmission Risk Interventions ? ?  03/27/2022  ?  9:57 AM  ?Readmission Risk Prevention Plan  ?Transportation Screening Complete  ?PCP or Specialist Appt within 3-5 Days Complete  ?[place] or Home Care Consult Complete  ?Social Work Consult for [place] Planning/Counseling Complete  ?Palliative Care Screening Complete  ?Medication Review Press photographer) Complete  ? ? ? ? ? ?

## 2022-03-30 NOTE — Care Management (Cosign Needed)
?  ?  Durable Medical Equipment  ?(From admission, onward)  ?  ? ? ?  ? ?  Start     Ordered  ? 03/30/22 0846  For home use only DME Hospital bed  Once       ?Question Answer Comment  ?Length of Need Lifetime   ?The above medical condition requires: Patient requires the ability to reposition frequently   ?Bed type Semi-electric   ?  ? 03/30/22 0846  ? ?  ?  ? ?  ?  ?

## 2022-03-30 NOTE — Progress Notes (Signed)
? ?Palliative Medicine Inpatient Follow Up Note ? ?HPI: ?81 year old female with history of PAF, complete heart block status post pacemaker which is not compatible to MRI, nonobstructive CAD, diastolic dysfunction, hypothyroidism, chronic kidney disease stage IIIb, recent multiple hospitalization presented to the ER on 3/23 with left eye swelling for 1 day. 3/2-3/5, admitted to the hospital with syncopal episode in the setting of UTI. 3/17-3/19, failure to thrive symptoms.  CT scan chest abdomen pelvis negative for malignancy.  Given prednisone on discharge for appetite stimulation. 3/23, presented back to the hospital with left eye swelling, more slurred speech, neck swelling with difficulty swallowing and intermittent coughing while eating.  Patient noted to be breaking into rashes and progressive swelling of the left eye and difficulty breathing. At the emergency room afebrile with stable vital signs.  Creatinine 2.05.  Mildly elevated AST and ALT.  Chest x-ray was normal.  She was given 500 cc of normal saline and admitted to the hospital. 3/24, she was on the medical floor.  Facial swelling worsened and she started having tongue swelling along with tachypnea and difficulty breathing.  She was found with edematous tongue, started on high-dose steroids , intubated and admitted to the intensive care unit. ?  ?Palliative care has been asked to get involved to further address goals of care. ? ?Today's Discussion (03/30/2022): ? ?*Please note that this is a verbal dictation therefore any spelling or grammatical errors are due to the "[place]" system interpretation. ? ?Chart reviewed inclusive of vital signs, progress notes, laboratory results, and diagnostic images.  ? ?I met with Karly at bedside this morning. She is awake though shares that she did not sleep well overnight. She expresses that she would like some time to sleep in this morning. She denies pain, nausea, or dyspnea. Briefly reviewed with  Korey the plan for placement to work on rehabilitation which she is aware off.  ?_______________________________________ ?Addendum: ? ?I met with Madissen this afternoon. She is resting in NAD, her spouse Quillian Quince is present at bedside. We discussed the idea of getting up for lunch which Alauna is agreeable to. She is able to stand and pivot to the chair. Appetite remains to e fairly poor despite supplemental nutrition encouragement. I shared that Quillian Quince can add ice cream to her supplemental drinks which sometimes make it taste less like chalk. He is agreeable to this. Reviewed the plan for rehab possibly as early as tomorrow. Questions addressed.  ? ?Objective Assessment: ?Vital Signs ?Vitals:  ? 03/30/22 0321 03/30/22 0810  ?BP: 135/89 137/89  ?Pulse: 100 60  ?Resp: 17 16  ?Temp: 97.9 ?F (36.6 ?C) 98.3 ?F (36.8 ?C)  ?SpO2: 98% 96%  ? ? ?Intake/Output Summary (Last 24 hours) at 03/30/2022 0959 ?Last data filed at 03/30/2022 0600 ?Gross per 24 hour  ?Intake 150 ml  ?Output --  ?Net 150 ml  ? ? ?Last Weight  Most recent update: 03/28/2022  5:01 AM  ? ? Weight  ?75.5 kg (166 lb 7.2 oz)  ?      ? ?  ? ?Gen: Elderly Caucasian female in no acute distress ?HEENT: Moist mucous membranes ?CV: Regular rate and irregular rhythm ?PULM: On room air ?ABD: soft/nontender ?EXT: No edema ?Neuro: A+O x3 ? ?SUMMARY OF RECOMMENDATIONS   ?DNAR ?  ?Continue to treat what is treatable ?  ?TOC - OP Palliative support on discharge ? ?Plan for skilled nursing facility ? ?Ongoing support ? ?MDM - Moderate ?______________________________________________________________________________________ ?Tacey Ruiz ?[place] Palliative Medicine Team ?  Team Cell Phone: [phone] ?Please utilize secure chat with additional questions, if there is no response within 30 minutes please call the above phone number ? ?Palliative Medicine Team providers are available by phone from 7am to 7pm daily and can be reached through the team cell phone.  ?Should this  patient require assistance outside of these hours, please call the patient's attending physician. ? ? ? ? ?

## 2022-03-31 DIAGNOSIS — H5789 Other specified disorders of eye and adnexa: Secondary | ICD-10-CM | POA: Diagnosis not present

## 2022-03-31 LAB — GLUCOSE, CAPILLARY
Glucose-Capillary: 84 mg/dL (ref 70–99)
Glucose-Capillary: 87 mg/dL (ref 70–99)

## 2022-03-31 NOTE — Discharge Summary (Addendum)
Physician Discharge Summary  ?Lauren Henry [id number] DOB: 12-03-1941 DOA: 03/13/2022 ? ?PCP: Haywood Pao, MD ? ?Admit date: 03/13/2022 ?Discharge date: 03/31/2022 ? ?Admitted From: Home ?Disposition:  Home with [place]; declined SNF ? ?Recommendations for Outpatient Follow-up:  ?Follow up with PCP in 1-2 weeks ?Please obtain BMP/CBC in one week your next doctors visit.  ?CShould continue to follow-up with palliative care team ?Continue to follow with Palliative care team.  ? ? ?Discharge Condition: Stable ?CODE STATUS: DNR ?Diet recommendation: Cardiac ?Recommendations   ?Diet recommendations: Dysphagia 3 (mechanical soft);Nectar-thick liquid ?Liquids provided via: Cup;Straw ?Medication Administration: Whole meds with puree ?Supervision: Patient able to self feed;Staff to assist with self feeding;Full supervision/cueing for compensatory strategies ?Compensations: Minimize environmental distractions ?Postural Changes and/or Swallow Maneuvers: Seated upright 90 degrees  ?     ? ? ?Brief/Interim Summary: ?81 year old female with history of P A-fib, complete heart block status post pacemaker, CAD, diastolic CHF, hypothyroidism, CKD stage IIIb admitted on March 23 for left eye swelling.  She has had multiple hospitalization recently including for syncope/UTI and failure to thrive.  During this admission she was treated for concerns of angioedema for unknown reason.  She was initially intubated taken to the ICU.  Treated with IV steroids.  Off-and-on her mentation was fluctuating with poor oral intake.  She was seen by palliative care and hospice team. ?Today she is doing much better back to her baseline. Facial swelling, rash and tongue swelling has resolved. Stable for dc.  ? ? ?Assessment and Plan: ?Angioedema; Resolved.  ?-Unknown etiology.  Work-up thus far has been negative.  She was treated with steroids and Pepcid.  Right arm skin biopsy was negative for immunoreactive cells.  She also had some rash which has  resolved. ?  ?Dysphagia ?-Seen by speech and swallow therapy. Dys 3 diet at home. Mechanical soft and Nectar thick Liquid.  ?  ?  ?Neck and right arm swelling ?-Resolved ?  ?Hypothyroidism ?-Continue Synthroid ?  ?PAF (paroxysmal atrial fibrillation) ([place]) ?Patient is currently in a paced rhythm. ?-Continue Eliquis ?  ?Chronic diastolic CHF (congestive heart failure) ([place]) ?-Not in acute exacerbation.  EF on recent echo was 55 to 60%, grade 1 DD. ?  ?Acute kidney injury superimposed on chronic kidney disease ([place]) ?Admission creatinine 2.0, now at baseline. ?  ?  ?Anemia of chronic disease ?-Hemoglobin at baseline of 12.0. ?  ?  ? ?  ?Body mass index is 30.93 kg/m?. ? ?  ? ? ? ?Discharge Diagnoses:  ?Principal Problem: ?  Eye swelling, left ?Active Problems: ?  Rash ?  Neck and right arm swelling ?  Dysphagia ?  Hypothyroidism ?  PAF (paroxysmal atrial fibrillation) ([place]) ?  CHB (complete heart block) ([facility]) s/p PPM ?  Anemia ?  Acute kidney injury superimposed on chronic kidney disease ([place]) ?  Swelling of right upper extremity ?  Chronic diastolic CHF (congestive heart failure) ([place]) ?  Angio-edema ? ? ? ? ? ?Consultations: ?PCCM ? ?Subjective: ?Doing well, back to hyer baseline. Would like to go home.  ? ?Discharge Exam: ?Vitals:  ? 03/31/22 0310 03/31/22 0748  ?BP: 118/67 115/86  ?Pulse: 67 73  ?Resp: 16 16  ?Temp: 98 ?F (36.7 ?C) 97.9 ?F (36.6 ?C)  ?SpO2: 97%   ? ?Vitals:  ? 03/30/22 2343 03/31/22 0302 03/31/22 0310 03/31/22 0748  ?BP: 138/65  118/67 115/86  ?Pulse: 66  67 73  ?Resp: 17  16 16   ?Temp: 97.7 ?F (36.5 ?C)  98 ?F (36.7 ?C) 97.9 ?F (36.6 ?C)  ?TempSrc: Oral  Oral Oral  ?SpO2: 97%  97%   ?Weight: 76.7 kg 76.7 kg    ?Height:      ? ? ?General: Pt is alert, awake, not in acute distress ?Cardiovascular: RRR, S1/S2 +, no rubs, no gallops ?Respiratory: CTA bilaterally, no wheezing, no rhonchi ?Abdominal: Soft, NT, ND, bowel sounds + ?Extremities: no edema, no cyanosis ? ?Discharge Instructions ? ?Discharge  Instructions   ? ? Amb Referral to Palliative Care   Complete by: As directed ?  ? ?  ? ?Allergies as of 03/31/2022   ? ?   Reactions  ? Clindamycin/lincomycin Swelling  ? Concern of angioedema  ? Methimazole Other (See Comments)  ? Elevated LFT's per [facility]   ? Olmesartan Other (See Comments)  ? enteritis  ? Amlodipine Swelling, Rash  ? ?  ? ?  ?Medication List  ?  ? ?STOP taking these medications   ? ?mirtazapine 7.5 MG tablet ?Commonly known as: REMERON ?  ?rosuvastatin 40 MG tablet ?Commonly known as: CRESTOR ?  ? ?  ? ?TAKE these medications   ? ?apixaban 2.5 MG Tabs tablet ?Commonly known as: Eliquis ?Take 1 tablet (2.5 mg total) by mouth 2 (two) times daily. ?  ?dorzolamide 2 % ophthalmic solution ?Commonly known as: TRUSOPT ?Place 1 drop into the left eye 2 (two) times daily. ?  ?dronabinol 2.5 MG capsule ?Commonly known as: MARINOL ?Take 1 capsule (2.5 mg total) by mouth 2 (two) times daily before lunch and supper. ?  ?ferrous sulfate 325 (65 FE) MG tablet ?Take 325 mg by mouth daily with breakfast. ?  ?ipratropium 0.03 % nasal spray ?Commonly known as: ATROVENT ?Place 2 sprays into both nostrils 2 (two) times daily as needed for rhinitis. ?  ?levothyroxine 150 MCG tablet ?Commonly known as: SYNTHROID ?Take 150-225 mcg by mouth See admin instructions. Take one tablet (150 mcg) by mouth daily before breakfast, on 2 days of the week take an additional 1/2 tablet (75 mg) for a total dose on those days of 225 mcg ?  ?metoprolol tartrate 25 MG tablet ?Commonly known as: LOPRESSOR ?Take 0.5 tablets (12.5 mg total) by mouth 2 (two) times daily. ?  ?multivitamin with minerals Tabs tablet ?Take 1 tablet by mouth every morning. ?  ?nystatin 100000 UNIT/ML suspension ?Commonly known as: MYCOSTATIN ?Take 5 mLs (500,000 Units total) by mouth 4 (four) times daily. Swiss and swallow ?  ?ondansetron 4 MG tablet ?Commonly known as: ZOFRAN ?Take 4 mg by mouth every 8 (eight) hours as needed for nausea or vomiting. ?   ?pantoprazole 40 MG tablet ?Commonly known as: PROTONIX ?Take 40 mg by mouth every morning. ?  ?polyvinyl alcohol 1.4 % ophthalmic solution ?Commonly known as: LIQUIFILM TEARS ?Place 1 drop into both eyes as needed for dry eyes. ?  ?thiamine 100 MG tablet ?Take 1 tablet (100 mg total) by mouth daily. ?  ?vitamin B-12 500 MCG tablet ?Commonly known as: CYANOCOBALAMIN ?Take 1 tablet (500 mcg total) by mouth daily. ?  ?vitamin C 250 MG tablet ?Commonly known as: ASCORBIC ACID ?Take 250 mg by mouth every morning. ?  ?Vitamin D3 50 MCG (2000 UT) Tabs ?Take 2,000 Units by mouth every morning. ?  ? ?  ? ?  ?  ? ? ?  ?Durable Medical Equipment  ?(From admission, onward)  ?  ? ? ?  ? ?  Start     Ordered  ? 03/30/22  0846  For home use only DME Hospital bed  Once       ?Question Answer Comment  ?Length of Need Lifetime   ?The above medical condition requires: Patient requires the ability to reposition frequently   ?Bed type Semi-electric   ?  ? 03/30/22 0846  ? ?  ?  ? ?  ? ? ?Allergies  ?Allergen Reactions  ? Clindamycin/Lincomycin Swelling  ?  Concern of angioedema  ? Methimazole Other (See Comments)  ?  Elevated LFT's per [facility]   ? Olmesartan Other (See Comments)  ?  enteritis  ? Amlodipine Swelling and Rash  ? ? ?You were cared for by a hospitalist during your hospital stay. If you have any questions about your discharge medications or the care you received while you were in the hospital after you are discharged, you can call the unit and asked to speak with the hospitalist on call if the hospitalist that took care of you is not available. Once you are discharged, your primary care physician will handle any further medical issues. Please note that no refills for any discharge medications will be authorized once you are discharged, as it is imperative that you return to your primary care physician (or establish a relationship with a primary care physician if you do not have one) for your aftercare needs so that  they can reassess your need for medications and monitor your lab values. ? ? ?Procedures/Studies: ?CT HEAD WO CONTRAST (5MM) ? ?Result Date: 03/25/2022 ?CLINICAL DATA:  Mental status change, unknown c

## 2022-03-31 NOTE — Discharge Instructions (Signed)
Recommendations   ?Diet recommendations: Dysphagia 3 (mechanical soft);Nectar-thick liquid ?Liquids provided via: Cup;Straw ?Medication Administration: Whole meds with puree ?Supervision: Patient able to self feed;Staff to assist with self feeding;Full supervision/cueing for compensatory strategies ?Compensations: Minimize environmental distractions ?Postural Changes and/or Swallow Maneuvers: Seated upright 90 degrees  ?     ? ?

## 2022-03-31 NOTE — Progress Notes (Signed)
Physical Therapy Treatment ?Patient Details ?Name: Lauren Henry ?MRN: [id number] ?DOB: 1941/06/30 ?Today's Date: 03/31/2022 ? ? ?History of Present Illness 81 y.o female  presents to the ED via ambulance 3/23 with a chief complaint of right arm swelling and left eye swollen x a few days.   Allergic reaction?  PMH:   complete heart block, HTN, Afib on eliquis ? ?  ?PT Comments  ? ? Patient wide awake and sitting on EOB on arrival. Assisted to [facility] with RW with min assist and off BSC with min assist. Continued vc for hand placement with use of RW and practiced sit to stand with RW an additional 5 reps with pt consistently needing cues for sequencing and min assist for boosting.  ?   ?Recommendations for follow up therapy are one component of a multi-disciplinary discharge planning process, led by the attending physician.  Recommendations may be updated based on patient status, additional functional criteria and insurance authorization. ? ?Follow Up Recommendations ? Skilled nursing-short term rehab (<3 hours/day) ?  ?  ?Assistance Recommended at Discharge Frequent or constant Supervision/Assistance  ?Patient can return home with the following Assistance with cooking/housework;Assist for transportation;Help with stairs or ramp for entrance;Direct supervision/assist for medications management;Direct supervision/assist for financial management;A little help with walking and/or transfers;Assistance with feeding ?  ?Equipment Recommendations ? Wheelchair (measurements PT);Wheelchair cushion (measurements PT);Hospital bed  ?  ?Recommendations for Other Services   ? ? ?  ?Precautions / Restrictions Precautions ?Precautions: Fall;Other (comment) ?Restrictions ?Weight Bearing Restrictions: No  ?  ? ?Mobility ? Bed Mobility ?  ?  ?  ?  ?  ?  ?  ?General bed mobility comments: pt sitting EOB on arrival ?  ? ?Transfers ?Overall transfer level: Needs assistance ?Equipment used: Rolling walker (2 wheels) ?Transfers: Sit to/from  Stand ?Sit to Stand: Min assist ?  ?  ?  ?  ?  ?General transfer comment: boosting assist; incr need as fatigued; 6 reps ?  ? ?Ambulation/Gait ?Ambulation/Gait assistance: Min assist ?Gait Distance (Feet): 15 Feet ?Assistive device: Rolling walker (2 wheels) ?Gait Pattern/deviations: Step-through pattern, Decreased stride length ?Gait velocity: reduced ?  ?  ?General Gait Details: slow, steady with RW; cues for upright posture ? ? ?Stairs ?  ?  ?  ?  ?  ? ? ?Wheelchair Mobility ?  ? ?Modified Rankin (Stroke Patients Only) ?  ? ? ?  ?Balance Overall balance assessment: Needs assistance ?Sitting-balance support: No upper extremity supported, Feet supported ?Sitting balance-Leahy Scale: Fair ?  ?  ?Standing balance support: Reliant on assistive device for balance ?Standing balance-Leahy Scale: Poor ?Standing balance comment: reliant on UE support for balance with RW ?  ?  ?  ?  ?  ?  ?  ?  ?  ?  ?  ?  ? ?  ?Cognition Arousal/Alertness: Awake/alert ?Behavior During Therapy: [facility] for tasks assessed/performed ?  ?  ?  ?  ?  ?  ?  ?  ?  ?  ?  ?  ?  ?  ?  ?  ?Problem Solving: Slow processing, Decreased initiation, Requires verbal cues, Requires tactile cues, Difficulty sequencing ?  ?  ?  ? ?  ?Exercises   ? ?  ?General Comments   ?  ?  ? ?Pertinent Vitals/Pain Pain Assessment ?Pain Assessment: No/denies pain ?Pain Location: abdomen ?Pain Descriptors / Indicators: Grimacing, Discomfort  ? ? ?Home Living   ?  ?  ?  ?  ?  ?  ?  ?  ?  ?   ?  ?  Prior Function    ?  ?  ?   ? ?PT Goals (current goals can now be found in the care plan section) Acute Rehab PT Goals ?Patient Stated Goal: not stated ?PT Goal Formulation: With patient/family ?Time For Goal Achievement: 04/14/22 ?Potential to Achieve Goals: Good ?Progress towards PT goals: Progressing toward goals (goals updated due to timeframe) ? ?  ?Frequency ? ? ? Min 3X/week ? ? ? ?  ?PT Plan Current plan remains appropriate  ? ? ?Co-evaluation   ?  ?  ?  ?  ? ?  ?AM-PAC PT "6  Clicks" Mobility   ?Outcome Measure ? Help needed turning from your back to your side while in a flat bed without using bedrails?: A Little ?Help needed moving from lying on your back to sitting on the side of a flat bed without using bedrails?: A Little ?Help needed moving to and from a bed to a chair (including a wheelchair)?: A Little ?Help needed standing up from a chair using your arms (e.g., wheelchair or bedside chair)?: A Little ?Help needed to walk in hospital room?: A Little ?Help needed climbing 3-5 steps with a railing? : Total ?6 Click Score: 16 ? ?  ?End of Session Equipment Utilized During Treatment: Gait belt ?Activity Tolerance: Patient tolerated treatment well ?Patient left: with call bell/phone within reach;in chair;with chair alarm set ?Nurse Communication: Mobility status ?PT Visit Diagnosis: Muscle weakness (generalized) (M62.81);Other abnormalities of gait and mobility (R26.89) ?  ? ? ?Time: [id number] ?PT Time Calculation (min) (ACUTE ONLY): 23 min ? ?Charges:  $Gait Training: 23-37 mins          ?          ? ? ?Arby Barrette, PT ?Acute Rehabilitation Services  ?Pager [phone] ?Office [phone] ? ? ? ?Jeanie Cooks Mykenna Viele ?03/31/2022, 11:07 AM ? ?

## 2022-03-31 NOTE — TOC Transition Note (Signed)
Transition of Care (TOC) - CM/SW Discharge Note ? ? ?Patient Details  ?Name: Lauren Henry ?MRN: [id number] ?Date of Birth: February 14, 1941 ? ?Transition of Care (TOC) CM/SW Contact:  ?Harriet Masson, RN ?Phone Number: ?03/31/2022, 3:53 PM ? ? ?Clinical Narrative:    ?Patient stable for discharge. Husband at bedside. Son will transport patient home in his Zenaida Niece. Hospital bed has been delivered and patient has all needed DME. Candise Henry with [facility] and [place] with [facility] notified of discharge. ? ? ? ?Final next level of care: Home w Home Health Services ?Barriers to Discharge: Barriers Resolved ? ? ?Patient Goals and CMS Choice ?Patient states their goals for this hospitalization and ongoing recovery are:: return home ?CMS Medicare.gov Compare Post Acute Care list provided to:: Patient ?Choice offered to / list presented to : Patient, Spouse ? ?Discharge Placement ?  ?          home ?  ?  ?  ?  ? ?Discharge Plan and Services ?In-house Referral: Clinical Social Work ?  ?Post Acute Care Choice: Durable Medical Equipment, Home Health          ?DME Arranged: Hospital bed ?DME Agency: [facility] ?Date DME Agency Contacted: 03/30/22 ?Time DME Agency Contacted: 1500 ?  ?HH Arranged: PT, OT, Speech Therapy, RN ?HH Agency: [facility] ?Date HH Agency Contacted: 03/30/22 ?Time HH Agency Contacted: 0815 ?Representative spoke with at [facility] Agency: Candise Henry ? ?Social Determinants of Health (SDOH) Interventions ?  ? ? ?Readmission Risk Interventions ? ?  03/31/2022  ?  3:51 PM 03/27/2022  ?  9:57 AM  ?Readmission Risk Prevention Plan  ?Transportation Screening Complete Complete  ?PCP or Specialist Appt within 5-7 Days Complete   ?PCP or Specialist Appt within 3-5 Days  Complete  ?Home Care Screening Complete   ?Medication Review (RN CM) Complete   ?HRI or Home Care Consult  Complete  ?Social Work Consult for Recovery Care Planning/Counseling  Complete  ?Palliative Care Screening  Complete  ?Medication Review Oceanographer)  Complete   ? ? ? ? ? ?

## 2022-03-31 NOTE — Plan of Care (Signed)

## 2022-03-31 NOTE — Progress Notes (Signed)
Speech Language Pathology Treatment: Dysphagia  ?Patient Details ?Name: Lauren Henry ?MRN: [id number] ?DOB: 25-Jan-1941 ?Today's Date: 03/31/2022 ?Time: [id number] ?SLP Time Calculation (min) (ACUTE ONLY): 11 min ? ?Assessment / Plan / Recommendation ?Clinical Impression ? Pt is much more alert, communciative, and engaged in PO trials than during previous visits from this SLP. She is self-feeding this morning, taking pills whole in puree from RN with throat clearing noted. SLP provided verbal cues to tuck her chin during swallowing. Although coffee on her table did not appear to be thickened enough, other liquids did, and she consumed these alongside trials of solids with only occasional throat clearing when using a chin tuck. She says she prefers this diet compared to the pureed diet she had previously been on ("looks better"). Will leave on Dys 3 diet and nectar thick liquids, allowing thin water or ice (not other liquids) in between meals after oral care.  ?  ?HPI HPI: Patient is an 81 y.o. female with PMH: afib, complete heart block s/p pacemaker (not MRI compatable), nonobstructive CAD, CKD stage III who presented to the hospital on 3/23 with left eye swelling. Patient had been recently hospitalized from 3/2 to 3/5 after having syncopal episode in setting of UTI where she was taken off blood pressure medications and treated with antibiotics. She was then hospitalized 3/17-3/19 for FTT symptoms with her husband reporting a progressive decline and at least 30lb weight loss since end of December. There was concern for possible underlying malignany but CT chest, abdomen and pelvis did not show any acute cause for her symptoms. Scans noted an 8 mm noncalcified nodule of the right lower lobe for which repeat CT scan recommended in 2 to 3 months.  She was also noted to have new tremor that was not thought to be parkinsonian in nature with mildly elevated free T4, but levothyroxine dose was deferred to PCP. In ED, patient  was afebrile and vital signs relatively stable, CXR showed no acute disease. On 3/24 RN called into patient's room by husband due to concerns of trouble breathing. Rapid Response was called and patient started on 2L [place] and transfered to unit 42M. on 3/25 patient with increased WOB and CXR showing interstitial changes, possible aspiration. She was intubated at [place] on 3/25 and extubated to 4L via [place] on 3/27 at 1018. ?  ?   ?SLP Plan ? Continue with current plan of care ? ?  ?  ?Recommendations for follow up therapy are one component of a multi-disciplinary discharge planning process, led by the attending physician.  Recommendations may be updated based on patient status, additional functional criteria and insurance authorization. ?  ? ?Recommendations  ?Diet recommendations: Dysphagia 3 (mechanical soft);Nectar-thick liquid ?Liquids provided via: Cup;Straw ?Medication Administration: Whole meds with puree ?Supervision: Patient able to self feed;Staff to assist with self feeding;Full supervision/cueing for compensatory strategies ?Compensations: Minimize environmental distractions ?Postural Changes and/or Swallow Maneuvers: Seated upright 90 degrees  ?   ?    ?   ? ? ? ? Oral Care Recommendations: Oral care BID ?Follow Up Recommendations: Skilled nursing-short term rehab (<3 hours/day) ?Assistance recommended at discharge: Frequent or constant Supervision/Assistance ?SLP Visit Diagnosis: Dysphagia, unspecified (R13.10) ?Plan: Continue with current plan of care ? ? ? ? ?  ?  ? ? ?Osie Bond., M.A. CCC-SLP ?Acute Rehabilitation Services ?Office [phone] ? ?Secure chat preferred ? ? ?03/31/2022, 9:24 AM ?

## 2022-04-03 ENCOUNTER — Telehealth: Payer: Medicare PPO | Admitting: Hospice

## 2022-04-03 ENCOUNTER — Telehealth: Payer: Self-pay

## 2022-04-03 DIAGNOSIS — I5032 Chronic diastolic (congestive) heart failure: Secondary | ICD-10-CM

## 2022-04-03 DIAGNOSIS — R531 Weakness: Secondary | ICD-10-CM | POA: Diagnosis not present

## 2022-04-03 DIAGNOSIS — E43 Unspecified severe protein-calorie malnutrition: Secondary | ICD-10-CM

## 2022-04-03 DIAGNOSIS — Z515 Encounter for palliative care: Secondary | ICD-10-CM | POA: Diagnosis not present

## 2022-04-03 NOTE — Telephone Encounter (Signed)
Spoke with patient and patient's spouse Lauren Henry and scheduled a Mychart Palliative Consult for 04/03/22 @ 2 PM.  ? ?Consent obtained; updated Netsmart, Team List and Epic.  ? ?

## 2022-04-03 NOTE — Progress Notes (Signed)
? ? ?Manufacturing engineer ?Community Palliative Care Consult Note ?Telephone: [phone]  ?Fax: [phone] ? ?PATIENT NAME: Lauren Henry ?[place] [id number] ?[phone] (home)  ?DOB: December 18, 1941 ?MRN: [id number] ? ?PRIMARY CARE PROVIDER:    ?Haywood Pao, MD,  ?[address] ?[place] [address] ?[phone] ? ?REFERRING PROVIDER:   ?Tisovec, Fransico Him, MD ?[address] ?[place],  [place] [address] ?[phone] ? ?RESPONSIBLE PARTY:   Self ?Contact Information   ? ? Name Relation Home Work Mobile  ? Kyra, Sphar Spouse   [phone]  ? ?  ? ? ?TELEHEALTH VISIT STATEMENT ?Due to the COVID-19 crisis, this visit was done via telemedicine from my office and it was initiated and consent by this patient and or family.  ?I connected with patient OR PROXY by a telephone/video  and verified that I am speaking with the correct person. I discussed the limitations of evaluation and management by telemedicine. The patient expressed understanding and agreed to proceed. ?Palliative Care was asked to follow this patient to address advance care planning, complex medical decision making and goals of care clarification.  Lauren Henry is home with patient during visit.  This is the initial visit.  ? ?  ASSESSMENT AND / RECOMMENDATIONS:  ? ?Advance Care Planning: Our advance care planning conversation included a discussion about:    ?The value and importance of advance care planning  ?Difference between Hospice and Palliative care ?Exploration of goals of care in the event of a sudden injury or illness  ?Identification and preparation of a healthcare agent  ?Review and updating or creation of an  advance directive document . ?Decision not to resuscitate or to de-escalate disease focused treatments due to poor prognosis. ? ?CODE STATUS: Patient is a DO NOT RESUSCITATE ? ?Goals of Care: Goals include to maximize quality of life and symptom management ? ?I spent  16 minutes providing this  initial consultation. More than 50% of the time in this consultation was spent on counseling patient and coordinating communication. ?-------------------------------------------------------------------------------------------------------------------------------------- ? ?Symptom Management/Plan: ?Protein caloric malnutrition: Severe with ongoing worsening weight loss: Current weight is 169 Ib, down from 189 Ib 3 months ago, Height 5 feet 5 inches.  Albumin 2.5 as at 03/25/22.  Patient reports appetite is improving.  Continue Marinol as ordered.  Continue Ensure BID ?Routine CBC CMP. ?Paroxysmal Afib: Managed with Eliquis ?Weakness: PT/OT is in process. Exercise as tolerated to optimize wellbeing. Fall precautions.  ?CHF: Patient is currently of diurectic due to previous recent dehydration. Reports no edema. Education on reduced salt. Monitor for edema or weight increase of 2 Ibs in a day or 5 Ibs in a week and notify provider.  ? ?Follow up: Palliative care will continue to follow for complex medical decision making, advance care planning, and clarification of goals. Return 6 weeks or prn. Encouraged to call provider sooner with any concerns.  ? ?Family /Caregiver/Community Supports: Patient lives at home with her spouse. Spouse reports patient has an order for Home health services - PT/OT. He will follow up for  date of onset of service.  ? ?HOSPICE ELIGIBILITY/DIAGNOSIS: TBD ? ?Chief Complaint: Initial Palliative care visit ? ?HISTORY OF PRESENT ILLNESS:  Lauren Henry is a 81 y.o. year old female  with multiple morbidities requiring close monitoring and with high risk of complications and  mortality: Paroxysmal atrial fibrillation,complete heart block s/p pacemaker (not MRI compatible) placement, nonobstructive CAD, diastolic dysfunction per 2D echo done in 2019, hypothyroidism, chronic kidney disease stage IIIb, protein  caloric malnutrition. History obtained from review of EMR, discussion with primary team,  caregiver, family and/or Lauren Henry.  ?Review and summarization of Epic records shows history from other than patient. Rest of 10 point ROS asked and negative.  ?Independent interpretation of tests and reviewed as needed, available labs, patient records, imaging, studies and related documents from the EMR. ? ?ROS ?General: NAD ?EYES: denies vision changes ?ENMT: denies dysphagia ?Cardiovascular: denies chest pain/discomfort ?Pulmonary: denies cough, denies SOB ?Abdomen: endorses fair to good appetite, denies constipation/diarrhea ?GU: denies dysuria, urinary frequency ?MSK:  endorses weakness,  no falls reported ?Skin: denies rashes or wounds ?Neurological: denies pain, denies insomnia ?Psych: Endorses positive mood ?Heme/lymph/immuno: denies bruises, abnormal bleeding ? ? ?PAST MEDICAL HISTORY:  ?Active Ambulatory Problems  ?  Diagnosis Date Noted  ? Right groin pain 01/13/2012  ? Back pain 08/27/2012  ? Essential hypertension 10/30/2014  ? Hyperlipidemia 10/30/2014  ? Hypothyroidism 10/30/2014  ? CHB (complete heart block) ([place]) s/p PPM 10/30/2014  ? Chest pain 11/09/2014  ? PAF (paroxysmal atrial fibrillation) ([place]) 11/09/2014  ? Preop cardiovascular exam 10/18/2015  ? 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 Urinary urgency   ? Urinary frequency   ? Presence of permanent cardiac pacemaker   ? Joint pain   ? History of colon polyps   ? History of blood transfusion   ? Diarrhea   ? Arthritis   ? 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 Knee pain 01/31/2013  ? Right-sided low back pain with right-sided sciatica 09/14/2014  ? Elevated troponin with new LBBB   ? Chronic anticoagulation 11/24/2018  ? CAD in native artery 11/24/2018  ? Chronic renal disease, stage III ([place]) 11/24/2018  ? Anemia 11/24/2018  ? Nausea vomiting and diarrhea 08/23/2020  ? AKI (acute kidney injury) ([place]) 08/27/2020  ? Gastroenteropathy 08/28/2020  ? Weakness 02/17/2022  ? Orthostatic hypotension 02/17/2022  ? Glaucoma  02/17/2022  ? Anxiety disorder 09/23/2009  ? Postural dizziness with presyncope 02/18/2022  ? Syncope and collapse 02/20/2022  ? UTI (urinary tract infection) 02/20/2022  ? Syncope 02/20/2022  ? Stage 3b chronic kidney disease (CKD) ([place]) 02/21/2022  ? Failure to thrive in adult 03/07/2022  ? 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 Microscopic hematuria 03/07/2022  ? Protein-calorie malnutrition, severe 03/09/2022  ? Acute kidney injury superimposed on chronic kidney disease ([place]) 03/13/2022  ? Neck and right arm swelling 03/13/2022  ? Rash 03/13/2022  ? Dysphagia 03/13/2022  ? Swelling of right upper extremity 03/13/2022  ? Eye swelling, left 03/13/2022  ? Chronic diastolic CHF (congestive heart failure) ([place]) 03/13/2022  ? Angio-edema   ? ?Resolved Ambulatory Problems  ?  Diagnosis Date Noted  ? Acute renal failure superimposed on stage 3b chronic kidney disease ([place]) 08/20/2020  ? Acute renal failure superimposed on stage 3 chronic kidney disease ([place]) 02/17/2022  ? ?Past Medical History:  ?Diagnosis Date  ? Atrial fibrillation ([place])   ? Dysrhythmia   ? Hypertension   ? ? ?SOCIAL HX:  ?Social History  ? ?Tobacco Use  ? Smoking status: Never  ? Smokeless tobacco: Never  ?Substance Use Topics  ? Alcohol use: Yes  ?  Alcohol/week: 1.0 standard drink  ?  Types: 1 Glasses of wine per week  ? ?  ?FAMILY HX:  ?Family History  ?Problem Relation Age of Onset  ? Heart disease Father   ? Heart attack Father   ? Stroke Sister   ?   ? ?ALLERGIES:  ?Allergies  ?  Allergen Reactions  ? Clindamycin/Lincomycin Swelling  ?  Concern of angioedema  ? Methimazole Other (See Comments)  ?  Elevated LFT's per [facility]   ? Olmesartan Other (See Comments)  ?  enteritis  ? Amlodipine Swelling and Rash  ?   ? ?PERTINENT MEDICATIONS:  ?Outpatient Encounter Medications as of 04/03/2022  ?Medication Sig  ? apixaban (ELIQUIS) 2.5 MG TABS tablet Take 1 tablet (2.5 mg total) by mouth 2 (two) times daily.  ? Cholecalciferol (VITAMIN D3) 50 MCG  (2000 UT) TABS Take 2,000 Units by mouth every morning.  ? dorzolamide (TRUSOPT) 2 % ophthalmic solution Place 1 drop into the left eye 2 (two) times daily.  ? dronabinol (MARINOL) 2.5 MG capsule Take 1 capsule (2.5

## 2022-04-04 DIAGNOSIS — T783XXA Angioneurotic edema, initial encounter: Secondary | ICD-10-CM | POA: Diagnosis not present

## 2022-04-04 DIAGNOSIS — I1310 Hypertensive heart and chronic kidney disease without heart failure, with stage 1 through stage 4 chronic kidney disease, or unspecified chronic kidney disease: Secondary | ICD-10-CM | POA: Diagnosis not present

## 2022-04-04 DIAGNOSIS — Z7901 Long term (current) use of anticoagulants: Secondary | ICD-10-CM | POA: Diagnosis not present

## 2022-04-04 DIAGNOSIS — Z8659 Personal history of other mental and behavioral disorders: Secondary | ICD-10-CM | POA: Diagnosis not present

## 2022-04-04 DIAGNOSIS — I480 Paroxysmal atrial fibrillation: Secondary | ICD-10-CM | POA: Diagnosis not present

## 2022-04-04 DIAGNOSIS — R1312 Dysphagia, oropharyngeal phase: Secondary | ICD-10-CM | POA: Diagnosis not present

## 2022-04-04 DIAGNOSIS — N184 Chronic kidney disease, stage 4 (severe): Secondary | ICD-10-CM | POA: Diagnosis not present

## 2022-04-11 ENCOUNTER — Ambulatory Visit (HOSPITAL_COMMUNITY)
Admission: EM | Admit: 2022-04-11 | Discharge: 2022-04-11 | Disposition: A | Discharge status: Home / Self Care | Payer: Medicare PPO

## 2022-04-11 NOTE — ED Triage Notes (Signed)
Pt presents for suture removal. Pt denies questions and concerns.  ?

## 2022-04-14 ENCOUNTER — Telehealth: Payer: Medicare PPO | Admitting: Hospice

## 2022-04-14 DIAGNOSIS — R531 Weakness: Secondary | ICD-10-CM

## 2022-04-14 DIAGNOSIS — I5032 Chronic diastolic (congestive) heart failure: Secondary | ICD-10-CM

## 2022-04-14 DIAGNOSIS — Z515 Encounter for palliative care: Secondary | ICD-10-CM

## 2022-04-14 DIAGNOSIS — E43 Unspecified severe protein-calorie malnutrition: Secondary | ICD-10-CM

## 2022-04-14 NOTE — Progress Notes (Signed)
? ? ?Manufacturing engineer ?Community Palliative Care Consult Note ?Telephone: [phone]  ?Fax: [phone] ? ?PATIENT NAME: Lauren Henry ?[place] [address] ?[phone] (home)  ?DOB: 1941-06-22 ?MRN: [id number] ? ?PRIMARY CARE PROVIDER:    ?Haywood Pao, MD,  ?[address] ?[place] [address] ?[phone] ? ?REFERRING PROVIDER:   ?Tisovec, Fransico Him, MD ?[address] ?[place],  [place] [address] ?[phone] ? ?RESPONSIBLE PARTY:   Self ?Contact Information   ? ? Name Relation Home Work Mobile  ? Tyronica, Stachowski Spouse   [phone]  ? ?  ? ? ?TELEHEALTH VISIT STATEMENT ?Due to the COVID-19 crisis, this visit was done via telemedicine from my office and it was initiated and consent by this patient and or family.  ?I connected with patient OR PROXY by a telephone/video  and verified that I am speaking with the correct person. I discussed the limitations of evaluation and management by telemedicine. The patient expressed understanding and agreed to proceed. ?Palliative Care was asked to follow this patient to address advance care planning, complex medical decision making and goals of care clarification.  Andee Poles is home with patient during visit.   ? ?  ASSESSMENT AND / RECOMMENDATIONS:  ? ?CODE STATUS: Patient is a DO NOT RESUSCITATE ? ?Goals of Care: Goals include to maximize quality of life and symptom management ? ?Symptom Management/Plan: ?Protein caloric malnutrition: Continue Marinol as ordered.  Continue Ensure BID. Current weight is 172 Ibs up from 169 Ibs a month ago and  down from 189 Ib 4 months ago, Height 5 feet 5 inches.  Albumin 2.5 as at 03/25/22.  PCP seen last week with no changes to plan of care. Routine CBC CMP ?Paroxysmal Afib: Managed with Eliquis ?Weakness: Now ambulatory with rolling walker. PT/OT is in progress. Exercise as tolerated to optimize wellbeing. Spouse reports plan to return hospital bed because patient no longer needs it. Spouse has  installed  some grab bars to promote safety and help prevent fall. Other fall precautions discussed. PT goal is for patient to walk with a cane.   ?CHF: Patient is not currently on diurectic due to previous recent dehydration. Reports no edema. Education on reduced salt. Elevate BLE. Monitor for edema or weight increase of 2 Ibs in a day or 5 Ibs in a week and notify provider.  ? ?Follow up: Palliative care will continue to follow for complex medical decision making, advance care planning, and clarification of goals. Return 6 weeks or prn. Encouraged to call provider sooner with any concerns.  ? ?Family /Caregiver/Community Supports: Patient lives at home with her spouse. Spouse has Home health services - PT/OT, Nursing aid.  ?HOSPICE ELIGIBILITY/DIAGNOSIS: TBD ? ?Chief Complaint: Follow up visit ? ?HISTORY OF PRESENT ILLNESS:  Lauren Henry is a 81 y.o. 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 History obtained from review of EMR, discussion with primary team, caregiver, family and/or Ms. Brautigan.  ?Review and summarization of Epic records shows history from other than patient. Rest of 10 point ROS asked and negative.  ?Independent interpretation of tests and reviewed as needed, available labs, patient records, imaging, studies and related documents from the EMR. ? ?ROS ?General: NAD ?EYES: denies vision changes ?ENMT: denies dysphagia ?  Cardiovascular: denies chest pain/discomfort ?Pulmonary: denies cough, denies SOB ?Abdomen: endorses fair to good appetite, denies constipation/diarrhea ?GU: denies dysuria, urinary frequency ?MSK:  endorses weakness, improving with  ongoing PT, no falls reported ?Skin: denies rashes or wounds ?Neurological: denies pain, denies insomnia ?Psych: Endorses positive mood ?Heme/lymph/immuno: denies bruises, abnormal bleeding ? ? ?PAST MEDICAL HISTORY:  ?Active Ambulatory Problems  ?  Diagnosis Date Noted  ? Right groin pain 01/13/2012  ? Back pain 08/27/2012  ? Essential hypertension 10/30/2014  ? Hyperlipidemia 10/30/2014  ? Hypothyroidism 10/30/2014  ? CHB (complete heart block) ([place]) s/p PPM 10/30/2014  ? Chest pain 11/09/2014  ? PAF (paroxysmal atrial fibrillation) ([place]) 11/09/2014  ? Preop cardiovascular exam 10/18/2015  ? 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 Urinary urgency   ? Urinary frequency   ? Presence of permanent cardiac pacemaker   ? Joint pain   ? History of colon polyps   ? History of blood transfusion   ? Diarrhea   ? Arthritis   ? 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 Knee pain 01/31/2013  ? Right-sided low back pain with right-sided sciatica 09/14/2014  ? Elevated troponin with new LBBB   ? Chronic anticoagulation 11/24/2018  ? CAD in native artery 11/24/2018  ? Chronic renal disease, stage III ([place]) 11/24/2018  ? Anemia 11/24/2018  ? Nausea vomiting and diarrhea 08/23/2020  ? AKI (acute kidney injury) ([place]) 08/27/2020  ? Gastroenteropathy 08/28/2020  ? Weakness 02/17/2022  ? Orthostatic hypotension 02/17/2022  ? Glaucoma 02/17/2022  ? Anxiety disorder 09/23/2009  ? Postural dizziness with presyncope 02/18/2022  ? Syncope and collapse 02/20/2022  ? UTI (urinary tract infection) 02/20/2022  ? Syncope 02/20/2022  ? Stage 3b chronic kidney disease (CKD) ([place]) 02/21/2022  ? Failure to thrive in adult 03/07/2022  ? 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 Microscopic hematuria 03/07/2022  ? Protein-calorie malnutrition, severe 03/09/2022  ? Acute kidney injury superimposed on chronic kidney disease ([place]) 03/13/2022  ? Neck and right arm swelling 03/13/2022  ? Rash 03/13/2022  ? Dysphagia 03/13/2022  ? Swelling  of right upper extremity 03/13/2022  ? Eye swelling, left 03/13/2022  ? Chronic diastolic CHF (congestive heart failure) ([place]) 03/13/2022  ? Angio-edema   ? ?Resolved Ambulatory Problems  ?  Diagnosis Date Noted  ? Acute renal failure superimposed on stage 3b chronic kidney disease ([place]) 08/20/2020  ? Acute renal failure superimposed on stage 3 chronic kidney disease ([place]) 02/17/2022  ? ?Past Medical History:  ?Diagnosis Date  ? Atrial fibrillation ([place])   ? Dysrhythmia   ? Hypertension   ? ? ?SOCIAL HX:  ?Social History  ? ?Tobacco Use  ? Smoking status: Never  ? Smokeless tobacco: Never  ?Substance Use Topics  ? Alcohol use: Yes  ?  Alcohol/week: 1.0 standard drink  ?  Types: 1 Glasses of wine per week  ? ?  ?FAMILY HX:  ?Family History  ?Problem Relation Age of Onset  ? Heart disease Father   ? Heart attack Father   ? Stroke Sister   ?   ? ?ALLERGIES:  ?Allergies  ?Allergen Reactions  ? Clindamycin/Lincomycin Swelling  ?  Concern of angioedema  ? Methimazole Other (See Comments)  ?  Elevated LFT's per [facility]   ? Olmesartan Other (See Comments)  ?  enteritis  ? Amlodipine Swelling and Rash  ?   ? ?PERTINENT MEDICATIONS:  ?Outpatient Encounter Medications as of 04/14/2022  ?Medication Sig  ? apixaban (ELIQUIS) 2.5 MG  TABS tablet Take 1 tablet (2.5 mg total) by mouth 2 (two) times daily.  ? Cholecalciferol (VITAMIN D3) 50 MCG (2000 UT) TABS Take 2,000 Units by mouth every morning.  ? dorzolamide (TRUSOPT) 2 % ophthalmic solution Place 1 drop into the left eye 2 (two) times daily.  ? dronabinol (MARINOL) 2.5 MG capsule Take 1 capsule (2.5 mg total) by mouth 2 (two) times daily before lunch and supper.  ? ferrous sulfate 325 (65 FE) MG tablet Take 325 mg by mouth daily with breakfast.  ? ipratropium (ATROVENT) 0.03 % nasal spray Place 2 sprays into both nostrils 2 (two) times daily as needed for rhinitis.  ? levothyroxine (SYNTHROID, LEVOTHROID) 150 MCG tablet Take 150-225 mcg by mouth See admin instructions.  Take one tablet (150 mcg) by mouth daily before breakfast, on 2 days of the week take an additional 1/2 tablet (75 mg) for a total dose on those days of 225 mcg  ? metoprolol tartrate (LOPRESSOR) 25 M

## 2022-04-15 ENCOUNTER — Encounter (HOSPITAL_COMMUNITY): Payer: Self-pay

## 2022-04-21 DIAGNOSIS — D631 Anemia in chronic kidney disease: Secondary | ICD-10-CM | POA: Diagnosis not present

## 2022-04-21 DIAGNOSIS — R7989 Other specified abnormal findings of blood chemistry: Secondary | ICD-10-CM | POA: Diagnosis not present

## 2022-04-21 DIAGNOSIS — N184 Chronic kidney disease, stage 4 (severe): Secondary | ICD-10-CM | POA: Diagnosis not present

## 2022-04-21 DIAGNOSIS — E039 Hypothyroidism, unspecified: Secondary | ICD-10-CM | POA: Diagnosis not present

## 2022-04-21 DIAGNOSIS — R1312 Dysphagia, oropharyngeal phase: Secondary | ICD-10-CM | POA: Diagnosis not present

## 2022-04-21 DIAGNOSIS — I480 Paroxysmal atrial fibrillation: Secondary | ICD-10-CM | POA: Diagnosis not present

## 2022-04-21 DIAGNOSIS — E7800 Pure hypercholesterolemia, unspecified: Secondary | ICD-10-CM | POA: Diagnosis not present

## 2022-04-21 DIAGNOSIS — Z7901 Long term (current) use of anticoagulants: Secondary | ICD-10-CM | POA: Diagnosis not present

## 2022-04-21 DIAGNOSIS — I1310 Hypertensive heart and chronic kidney disease without heart failure, with stage 1 through stage 4 chronic kidney disease, or unspecified chronic kidney disease: Secondary | ICD-10-CM | POA: Diagnosis not present

## 2022-04-25 DIAGNOSIS — I442 Atrioventricular block, complete: Secondary | ICD-10-CM | POA: Diagnosis not present

## 2022-04-25 DIAGNOSIS — G20 Parkinson's disease: Secondary | ICD-10-CM | POA: Diagnosis not present

## 2022-04-25 DIAGNOSIS — I1310 Hypertensive heart and chronic kidney disease without heart failure, with stage 1 through stage 4 chronic kidney disease, or unspecified chronic kidney disease: Secondary | ICD-10-CM | POA: Diagnosis not present

## 2022-04-25 DIAGNOSIS — I2510 Atherosclerotic heart disease of native coronary artery without angina pectoris: Secondary | ICD-10-CM | POA: Diagnosis not present

## 2022-04-25 DIAGNOSIS — I5032 Chronic diastolic (congestive) heart failure: Secondary | ICD-10-CM | POA: Diagnosis not present

## 2022-04-25 DIAGNOSIS — N189 Chronic kidney disease, unspecified: Secondary | ICD-10-CM | POA: Diagnosis not present

## 2022-04-25 DIAGNOSIS — D631 Anemia in chronic kidney disease: Secondary | ICD-10-CM | POA: Diagnosis not present

## 2022-04-25 DIAGNOSIS — I447 Left bundle-branch block, unspecified: Secondary | ICD-10-CM | POA: Diagnosis not present

## 2022-04-25 DIAGNOSIS — M160 Bilateral primary osteoarthritis of hip: Secondary | ICD-10-CM | POA: Diagnosis not present

## 2022-04-29 DIAGNOSIS — H04123 Dry eye syndrome of bilateral lacrimal glands: Secondary | ICD-10-CM | POA: Diagnosis not present

## 2022-04-29 DIAGNOSIS — H40052 Ocular hypertension, left eye: Secondary | ICD-10-CM | POA: Diagnosis not present

## 2022-04-29 DIAGNOSIS — H353132 Nonexudative age-related macular degeneration, bilateral, intermediate dry stage: Secondary | ICD-10-CM | POA: Diagnosis not present

## 2022-04-29 DIAGNOSIS — H34832 Tributary (branch) retinal vein occlusion, left eye, with macular edema: Secondary | ICD-10-CM | POA: Diagnosis not present

## 2022-04-29 DIAGNOSIS — H35033 Hypertensive retinopathy, bilateral: Secondary | ICD-10-CM | POA: Diagnosis not present

## 2022-05-06 ENCOUNTER — Ambulatory Visit (INDEPENDENT_AMBULATORY_CARE_PROVIDER_SITE_OTHER): Payer: Medicare PPO

## 2022-05-06 DIAGNOSIS — I442 Atrioventricular block, complete: Secondary | ICD-10-CM

## 2022-05-08 LAB — CUP PACEART REMOTE DEVICE CHECK
Battery Impedance: 891 Ohm
Battery Remaining Longevity: 63 mo
Battery Voltage: 2.77 V
Brady Statistic AP VP Percent: 21 %
Brady Statistic AP VS Percent: 0 %
Brady Statistic AS VP Percent: 74 %
Brady Statistic AS VS Percent: 5 %
Date Time Interrogation Session: 20230516101611
Implantable Lead Implant Date: 20151111
Implantable Lead Implant Date: 20151111
Implantable Lead Location: 753859
Implantable Lead Location: 753860
Implantable Lead Model: 5076
Implantable Lead Model: 5076
Implantable Pulse Generator Implant Date: 20151111
Lead Channel Impedance Value: 495 Ohm
Lead Channel Impedance Value: 669 Ohm
Lead Channel Pacing Threshold Amplitude: 0.625 V
Lead Channel Pacing Threshold Amplitude: 1.375 V
Lead Channel Pacing Threshold Pulse Width: 0.4 ms
Lead Channel Pacing Threshold Pulse Width: 0.4 ms
Lead Channel Setting Pacing Amplitude: 1.5 V
Lead Channel Setting Pacing Amplitude: 2.5 V
Lead Channel Setting Pacing Pulse Width: 0.46 ms
Lead Channel Setting Sensing Sensitivity: 4 mV

## 2022-05-13 ENCOUNTER — Telehealth: Payer: Self-pay

## 2022-05-21 NOTE — Progress Notes (Signed)
Cardiology Office Note  Date:  05/26/2022   ID:  JAZMAN RUNYAN, DOB 06/17/1941, MRN [id number]  PCP:  Haywood Pao, MD  Cardiologist:   Manu Rubey Martinique, MD   Chief Complaint  Patient presents with   Atrial Fibrillation   pacemaker      History of Present Illness: Lauren Henry is a 81 y.o. female  seen to establish follow up cardiac care. Previously seen by Dr Oval Linsey. She has  complete heart block s/p PPM, paroxysmal atrial fibrillation, mild-moderate CAD, and hypertension.  [name] had a pacemaker implanted by Dr. Lovena Le in 2015 due to complete heart block.  She was minimally symptomatic and reports that it was incidentally found when she presented for knee surgery.  She has difficult to manage hypertension.  She had  an echo 09/13/18 that revealed LVEF 45-50% with diffuse hypokinesis and severe mitral annular calcification.  A Lexiscan Myoview 10/07/18 that revealed a reversible defect in the inferior, apical and inferolateral regions.  This was associated with apical akinesis and inferoapical dyskinesis.  This was thought to be either ischemia or a pacemaker artifact.  Given that she was feeling well and has chronic kidney disease she elected medical management.  Clonidine was increased due to poorly controlled blood pressure.  She was admitted with hypertensive urgency.  Her blood pressure was as high as the 240s at home.  Troponin was mildly elevated and she underwent cardiac catheterization 10/2018 that revealed mild to moderate non-obstructive CAD.  There was some concern about the proximal LAD.  However FFR was unremarkable.  In February 2023, patient was admitted for postural dizziness with presyncope.  This was felt to be due to orthostatic hypotension in the setting of multiple antihypertensives and a decreased oral intake.  If she was treated for UTI at that time.  Since then, she had no further episode of syncope.  Her PCP placed her on a short course of prednisone in effort  to stimulate her appetite.  Follow-up lab work shows significantly elevated FTs, therefore patient was readmitted to the hospital on 03/07/2022.  Since December 2022, she lost roughly 30 pounds due to lack of appetite.  Cardiology service was consulted during the hospitalization due to elevated troponin and LBBB.  She denied any chest pain or worsening dyspnea.  She was seen by Dr. Burt Knack.  Echocardiogram in March demonstrated EF 55 to 60%, no regional wall motion abnormality, moderate LVH, mild to moderate LAE, no significant valve issue.  Given the reassuring echocardiogram and the mildly elevated troponin in the setting of her CKD, it was unlikely that cardiac pathology was contributing to her failure to thrive.  During the hospitalization, patient also had parkinsonian features and resting tremors, MRI was contraindicated due to pacemaker.  Neurology recommended CT myelogram at some point as outpatient.  On subsequent outpatient visit her LFTs normalized.   She was readmitted 3/23-4/10/23 with acute angioedema with swelling involving her face, eye, neck and right arm. Required ventilator support. Treated with steroids and Pepcid with improvement. Cause of her angioedema was never determined.   She is seen today for follow up. She reports she feels very well. Denies any dyspnea, palpitations, chest pain. Is concerned about her BP. Currently just taking low dose metoprolol. She brings a diary. Had one BP reading of 0000000 systolic otherwise BP [id number] systolic. No edema. No facial swelling.    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, developed CHB 10/2014 s/p [employer]tr[employer] PPM; atrial fib (PAF)   History of blood transfusion as a child   no abnormal reaction noted   History of colon polyps    Hyperlipidemia    takes Pravastatin daily   Hypertension    takes Azor and Metoprolol daily    Hypothyroidism    takes Synthroid daily   Joint pain    Presence of permanent cardiac pacemaker    Urinary frequency    Urinary urgency     Past Surgical History:  Procedure Laterality Date   ABDOMINAL HYSTERECTOMY     cataract surgery Bilateral    CHOLECYSTECTOMY  1971   COLONOSCOPY     EYE SURGERY     INSERT / REPLACE / REMOVE PACEMAKER     Medtronic PPM 11/01/14 (Dr. Cristopher Peru)   JOINT REPLACEMENT Right    knee   LEFT HEART CATH AND CORONARY ANGIOGRAPHY N/A 11/01/2018   Procedure: LEFT HEART CATH AND CORONARY ANGIOGRAPHY;  Surgeon: Nelva Bush, MD;  Location: [facility];  Service: Cardiovascular;  Laterality: N/A;   PERMANENT PACEMAKER INSERTION N/A 11/01/2014   Procedure: PERMANENT PACEMAKER INSERTION;  Surgeon: Evans Lance, MD;  [facility] CATH [facility];  Service: Cardiovascular;  Laterality: N/A;   stomach stapled  1985   TOTAL KNEE ARTHROPLASTY Left 11/05/2015   Procedure: TOTAL KNEE ARTHROPLASTY;  Surgeon: Vickey Huger, MD;  Location: [facility];  Service: Orthopedics;  Laterality: Left;     Current Outpatient Medications  Medication Sig Dispense Refill   apixaban (ELIQUIS) 2.5 MG TABS tablet Take 1 tablet (2.5 mg total) by mouth 2 (two) times daily. 60 tablet 0   Cholecalciferol (VITAMIN D3) 50 MCG (2000 UT) TABS Take 2,000 Units by mouth every morning.     dorzolamide (TRUSOPT) 2 % ophthalmic solution Place 1 drop into the left eye 2 (two) times daily.     ferrous sulfate 325 (65 FE) MG tablet Take 325 mg by mouth daily with breakfast.     ipratropium (ATROVENT) 0.03 % nasal spray Place 2 sprays into both nostrils 2 (two) times daily as needed for rhinitis.     levothyroxine (SYNTHROID, LEVOTHROID) 150 MCG tablet Take 150-225 mcg by mouth See admin instructions. Take one tablet (150 mcg) by mouth daily before breakfast, on 2 days of the week take an additional 1/2 tablet (75 mg) for a total dose on those days of 225 mcg  0   metoprolol tartrate (LOPRESSOR) 25  MG tablet Take 0.5 tablets (12.5 mg total) by mouth 2 (two) times daily. 60 tablet 3   Multiple Vitamin (MULTIVITAMIN WITH MINERALS) TABS tablet Take 1 tablet by mouth every morning.     nystatin (MYCOSTATIN) 100000 UNIT/ML suspension Take 5 mLs (500,000 Units total) by mouth 4 (four) times daily. Swiss and swallow 60 mL 0   ondansetron (ZOFRAN) 4 MG tablet Take 4 mg by mouth every 8 (eight) hours as needed for nausea or vomiting.     pantoprazole (PROTONIX) 40 MG tablet Take 40 mg by mouth every morning.  3   vitamin C (ASCORBIC ACID) 250 MG tablet Take 250 mg by mouth every morning.     No current facility-administered medications for this visit.    Allergies:   Clindamycin/lincomycin, Methimazole, Olmesartan, and Amlodipine    Social History:  The patient  reports that she has never smoked. She has never used smokeless tobacco.  She reports current alcohol use of about 1.0 standard drink per week. She reports that she does not use drugs.   Family History:  The patient's family history includes Heart attack in her father; Heart disease in her father; Stroke in her sister.    ROS:  Please see the history of present illness.   Otherwise, review of systems are positive for none.   All other systems are reviewed and negative.    PHYSICAL EXAM: VS:  BP (!) 165/88   Pulse 85   Ht 5\' 5"  (1.651 m)   Wt 179 lb 3.2 oz (81.3 kg)   SpO2 99%   BMI 29.82 kg/m  , BMI Body mass index is 29.82 kg/m. GENERAL:  Well appearing, overweight WF in NAD HEENT: Pupils equal round and reactive, fundi not visualized, oral mucosa unremarkable NECK:  No jugular venous distention, waveform within normal limits, carotid upstroke brisk and symmetric, no bruits LUNGS:  Clear to auscultation bilaterally HEART:  RRR.  PMI not displaced or sustained,S1 and S2 within normal limits, no S3, no S4, no clicks, no rubs, II/VI systolic murmur at LUSB ABD:  Flat, positive bowel sounds normal in frequency in pitch, no bruits,  no rebound, no guarding, no midline pulsatile mass, no hepatomegaly, no splenomegaly EXT:  2 plus pulses throughout, no edema, no cyanosis no clubbing SKIN:  No rashes no nodules NEURO:  Cranial nerves II through XII grossly intact, motor grossly intact throughout PSYCH:  Cognitively intact, oriented to person place and time   EKG:  EKG is not ordered today.    Echo 10/30/14: Study Conclusions  - Left ventricle: The cavity size was normal. There was mild focal   basal hypertrophy of the septum. Systolic function was normal.   The estimated ejection fraction was in the range of 60% to 65%.   Wall motion was normal; there were no regional wall motion   abnormalities. - Aortic valve: Trileaflet; mildly thickened, mildly calcified   leaflets. Transvalvular velocity was minimally increased. There   was no stenosis. - Mitral valve: Calcified annulus. There was mild regurgitation. - Right ventricle: The cavity size was mildly dilated. Wall   thickness was normal.   Echo 09/13/18: Study Conclusions   - Left ventricle: The cavity size was normal. There was mild focal   basal hypertrophy of the septum. Systolic function was mildly   reduced. The estimated ejection fraction was in the range of 45%   to 50%. Diffuse hypokinesis. Doppler parameters are consistent   with both elevated ventricular end-diastolic filling pressure and   elevated left atrial filling pressure. - Mitral valve: Severely calcified annulus. Mildly thickened,   mildly calcified leaflets . - Left atrium: The atrium was mildly dilated. - Atrial septum: No defect or patent foramen ovale was identified.   Lexiscan Myoview 10/07/18:   The left ventricular ejection fraction is mildly decreased (45-54%). Nuclear stress EF: 54%. No T wave inversion was noted during stress. There was no ST segment deviation noted during stress. Defect 1: There is a large defect of moderate severity.   Large size, moderate severity  partially reversible inferior, apical and inferolateral perfusion defect, suggestive of ischemia. This could also represent pacemaker-related artifact. There is apical akinesis and inferoapical dyskinesis. LVEF 54%. This is an intermediate risk study. Clinical correlation is advised.   LHC 10/2018: Conclusions: Mild to moderate, non-obstructive CAD.  Most severe lesion is a 50-60% stenosis in the proximal LCx that is not hemodynamically significant (DFR 1.0).  I  suspect mild troponin elevation reflects supply-demand mismatch in the setting of uncontrolled hypertension and renal insufficiency. Normal left ventricular filling pressure.   Recommendations: Medical therapy and risk factor modification to prevent progression of disease. Consider noninvasive evaluation for renal artery stenosis (if not already performed as an outpatient), given hypertension and renal insufficiency.   Echo 03/14/22: IMPRESSIONS     1. Left ventricular ejection fraction, by estimation, is 55 to 60%. The  left ventricle has normal function. The left ventricle has no regional  wall motion abnormalities. There is moderate concentric left ventricular  hypertrophy. Left ventricular  diastolic parameters are consistent with Grade I diastolic dysfunction  (impaired relaxation).   2. Right ventricular systolic function is normal. The right ventricular  size is normal. There is normal pulmonary artery systolic pressure.   3. Left atrial size was mild to moderately dilated.   4. The mitral valve is abnormal. No evidence of mitral valve  regurgitation.   5. The aortic valve is calcified. Aortic valve regurgitation is not  visualized. Mild aortic valve stenosis.   Recent Labs: 03/13/2022: B Natriuretic Peptide 551.8; TSH 3.233 03/25/2022: ALT 53; BUN 43; Creatinine, Ser 1.11; Hemoglobin 12.3; Magnesium 1.7; Platelets 148; Potassium 4.4; Sodium 138    Lipid Panel    Component Value Date/Time   CHOL 150 06/06/2021 0821    TRIG 158 (H) 03/18/2022 0226   HDL 51 06/06/2021 0821   CHOLHDL 2.9 06/06/2021 0821   CHOLHDL 2.3 10/30/2018 1852   VLDL 10 10/30/2018 1852   LDLCALC 85 06/06/2021 0821   07/22/2019: Total cholesterol 127, triglycerides 80, HDL 42, LDL 69 Dated 08/28/21: Hgb 11.2. creatinine 1.5. BUN 30, otherwise CBC and CMET normal. Cholesterol 129, triglycerides 64, HDL 41, LDL 75. TSH normal.  Wt Readings from Last 3 Encounters:  05/26/22 179 lb 3.2 oz (81.3 kg)  03/31/22 169 lb 1.5 oz (76.7 kg)  03/12/22 169 lb (76.7 kg)      ASSESSMENT AND PLAN:  PAF:   Rate is stable.Continue metoprolol  and Eliquis. On reduced Eliquis due to age and fluctuating renal function Burden 0.9% by pacemaker follow up.   2.  Labile Hypertension:   Blood pressure is now under excellent control on current regimen. She is tolerating well. Continue. She will continue to monitor BP. I told her I would not be concerned with a single elevation. Only if BP is sustained high over a week.    3. Non-obstructive CAD: Left heart cath 10/2018 showed nonobstructive disease.  LDL was 72 on 08/2020.  Continue carvedilol and rosuvastatin.  She is not on aspirin given that she is on Eliquis.  4. Hyperlipidemia- no longer on statin. I would not resume at this point given all her issues this year. We can reconsider on follow up   5. CHB s/p PPM:   Managed by Dr. Lovena Le.     Current medicines are reviewed at length with the patient today.  The patient does not have concerns regarding medicines.  The following changes have been made: none  Labs/ tests ordered today include:   No orders of the defined types were placed in this encounter.     Disposition:  follow up in 6 months    Signed, Gordana Kewley Martinique MD, [facility]  05/26/2022 1:58 PM    [place]

## 2022-05-22 NOTE — Progress Notes (Signed)
Remote pacemaker transmission.   

## 2022-05-26 ENCOUNTER — Ambulatory Visit (INDEPENDENT_AMBULATORY_CARE_PROVIDER_SITE_OTHER): Payer: Medicare PPO | Admitting: Cardiology

## 2022-05-26 ENCOUNTER — Encounter: Payer: Self-pay | Admitting: Cardiology

## 2022-05-26 VITALS — BP 165/88 | HR 85 | Ht 65.0 in | Wt 179.2 lb

## 2022-05-26 DIAGNOSIS — I442 Atrioventricular block, complete: Secondary | ICD-10-CM | POA: Diagnosis not present

## 2022-05-26 DIAGNOSIS — I480 Paroxysmal atrial fibrillation: Secondary | ICD-10-CM | POA: Diagnosis not present

## 2022-05-26 DIAGNOSIS — I2510 Atherosclerotic heart disease of native coronary artery without angina pectoris: Secondary | ICD-10-CM

## 2022-05-26 DIAGNOSIS — E7800 Pure hypercholesterolemia, unspecified: Secondary | ICD-10-CM | POA: Diagnosis not present

## 2022-05-26 DIAGNOSIS — I10 Essential (primary) hypertension: Secondary | ICD-10-CM

## 2022-05-26 NOTE — Patient Instructions (Signed)

## 2022-06-13 ENCOUNTER — Telehealth: Payer: Medicare PPO | Admitting: Hospice

## 2022-06-13 DIAGNOSIS — E43 Unspecified severe protein-calorie malnutrition: Secondary | ICD-10-CM

## 2022-06-13 DIAGNOSIS — Z515 Encounter for palliative care: Secondary | ICD-10-CM

## 2022-06-13 DIAGNOSIS — R531 Weakness: Secondary | ICD-10-CM

## 2022-06-13 DIAGNOSIS — I5032 Chronic diastolic (congestive) heart failure: Secondary | ICD-10-CM

## 2022-08-01 ENCOUNTER — Other Ambulatory Visit: Payer: Medicare PPO | Admitting: Hospice

## 2022-08-01 DIAGNOSIS — Z515 Encounter for palliative care: Secondary | ICD-10-CM

## 2022-08-01 DIAGNOSIS — R531 Weakness: Secondary | ICD-10-CM

## 2022-08-01 DIAGNOSIS — E43 Unspecified severe protein-calorie malnutrition: Secondary | ICD-10-CM

## 2022-08-01 DIAGNOSIS — I5032 Chronic diastolic (congestive) heart failure: Secondary | ICD-10-CM

## 2022-08-01 NOTE — Progress Notes (Signed)
Therapist, nutritional Palliative Care Consult Note Telephone: [phone]  Fax: [phone]  PATIENT NAME: Lauren Henry [address] [place] [address] [phone] (home)  DOB: 1941/09/09 MRN: [id number]  PRIMARY CARE PROVIDER:    Gaspar Garbe, MD,  [address] [place] [address] [phone]  REFERRING PROVIDER:   Gaspar Garbe, MD [address] [place],  [place] [address] [phone]  RESPONSIBLE PARTY:   Self [phone] [name] and Hillary Bow Information     Name Relation Home Work Mobile   Satre,Daniel Spouse   [phone]       TELEHEALTH VISIT STATEMENT Due to the COVID-19 crisis, this visit was done via telemedicine from my office and it was initiated and consent by this patient and or family.  I connected with patient OR PROXY by a telephone/video  and verified that I am speaking with the correct person. I discussed the limitations of evaluation and management by telemedicine. The patient expressed understanding and agreed to proceed. Palliative Care was asked to follow this patient to address advance care planning, complex medical decision making and goals of care clarification.  [name] is home with patient during visit.      ASSESSMENT AND / RECOMMENDATIONS:   CODE STATUS: Patient is a DO NOT RESUSCITATE  Goals of Care: Goals include to maximize quality of life and symptom management  Symptom Management/Plan: Protein caloric malnutrition: Appetite improved.  Patient not taking Marinol. Current weight is 173 Ibs up from 169 Ibs 2 months ago and  down from 189 Ib 5 months ago, Height 5 feet 5 inches.  Albumin 2.5 as at 03/25/22. Education on need for heart healthy diet and getting adequate fruits, vegetable and protein in daily meals.F/u with PCP planned for 09/01/22.  Paroxysmal Afib: Managed with Eliquis Weakness: Improved, PT/OT completed. Now ambulatory; encouraged use of assistive device to  provide support and help prevent a fall. Exercise as tolerated to optimize wellbeing. Fall precautions discussed.  CHF: No edema, no respiratory distress. F/u by Cardiologist. Last Cardiologist, not currently on diurectic due to previous recent dehydration. Education reiterated on reduced salt. Elevate BLE. Monitor for edema or weight increase of 2 Ibs in a day or 5 Ibs in a week and notify provider.   Follow up: Palliative care will continue to follow for complex medical decision making, advance care planning, and clarification of goals. Return 6 weeks or prn. Encouraged to call provider sooner with any concerns.   Family /Caregiver/Community Supports: Patient lives at home with her spouse. Strong family support system identified.  HOSPICE ELIGIBILITY/DIAGNOSIS: TBD  Chief Complaint: Follow up visit  HISTORY OF PRESENT ILLNESS:  Lauren Henry is a 81 y.o. 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 Patient reports more energy, doing well  more than before. History obtained from review of EMR, discussion with primary team, caregiver, family and/or [name].  Review and summarization of Epic records shows history from other than patient. Rest of 10 point ROS asked and negative.  I reviewed as needed, available labs, patient records, imaging, studies and related documents from the EMR.   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   Urinary urgency    Urinary frequency    Presence of permanent cardiac pacemaker    Joint pain    History of colon polyps    History of blood transfusion    Diarrhea    Arthritis    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   Knee pain 01/31/2013   Right-sided low back pain with right-sided sciatica 09/14/2014   Elevated troponin with new LBBB    Chronic anticoagulation 11/24/2018   CAD in native artery 11/24/2018   Chronic renal disease, stage III ([place]) 11/24/2018   Anemia 11/24/2018   Nausea vomiting and diarrhea 08/23/2020   AKI (acute kidney injury) ([place] [facility]) 08/27/2020   Gastroenteropathy 08/28/2020   Weakness 02/17/2022   Orthostatic hypotension 02/17/2022   Glaucoma 02/17/2022   Anxiety disorder 09/23/2009   Postural dizziness with presyncope 02/18/2022   Syncope and collapse 02/20/2022   UTI (urinary tract infection) 02/20/2022   Syncope 02/20/2022   Stage 3b chronic kidney disease (CKD) ([facility]) 02/21/2022   Failure to thrive in adult 03/07/2022   Parkinsonian features 03/07/2022   Microscopic hematuria 03/07/2022   Protein-calorie malnutrition, severe 03/09/2022   Acute kidney injury superimposed on chronic kidney disease ([facility]) 03/13/2022   Neck and right arm swelling 03/13/2022   Rash 03/13/2022   Dysphagia 03/13/2022   Swelling of right upper extremity 03/13/2022   Eye swelling, left 03/13/2022   Chronic diastolic CHF (congestive heart failure) ([facility]) 03/13/2022   Angio-edema    Resolved Ambulatory Problems    Diagnosis Date Noted   Acute renal failure superimposed on stage 3b chronic kidney disease ([place]) 08/20/2020   Acute renal failure superimposed on stage 3 chronic kidney disease ([facility]) 02/17/2022   Past Medical History:  Diagnosis Date   Atrial fibrillation ([facility])     Dysrhythmia    Hypertension     SOCIAL HX:  Social History   Tobacco Use   Smoking status: Never   Smokeless tobacco: Never  Substance Use Topics   Alcohol use: Yes    Alcohol/week: 1.0 standard drink of alcohol    Types: 1 Glasses of wine per week     FAMILY HX:  Family History  Problem Relation Age of Onset   Heart disease Father    Heart attack Father    Stroke Sister       ALLERGIES:  Allergies  Allergen Reactions   Clindamycin/Lincomycin Swelling    Concern of angioedema   Methimazole Other (See Comments)    Elevated LFT's per [facility]    Olmesartan Other (See Comments)    enteritis   Amlodipine Swelling and Rash      PERTINENT MEDICATIONS:  Outpatient Encounter Medications as of 08/01/2022  Medication Sig   apixaban (ELIQUIS) 2.5 MG TABS tablet Take 1 tablet (2.5 mg total) by mouth 2 (two) times daily.   Cholecalciferol (VITAMIN D3) 50 MCG (2000 UT) TABS Take 2,000 Units by mouth every morning.   dorzolamide (TRUSOPT) 2 % ophthalmic solution Place 1 drop into the left eye 2 (two) times daily.   ferrous sulfate 325 (65 FE) MG tablet Take 325 mg by mouth daily with breakfast.  ipratropium (ATROVENT) 0.03 % nasal spray Place 2 sprays into both nostrils 2 (two) times daily as needed for rhinitis.   levothyroxine (SYNTHROID, LEVOTHROID) 150 MCG tablet Take 150-225 mcg by mouth See admin instructions. Take one tablet (150 mcg) by mouth daily before breakfast, on 2 days of the week take an additional 1/2 tablet (75 mg) for a total dose on those days of 225 mcg   metoprolol tartrate (LOPRESSOR) 25 MG tablet Take 0.5 tablets (12.5 mg total) by mouth 2 (two) times daily.   Multiple Vitamin (MULTIVITAMIN WITH MINERALS) TABS tablet Take 1 tablet by mouth every morning.   nystatin (MYCOSTATIN) 100000 UNIT/ML suspension Take 5 mLs (500,000 Units total) by mouth 4 (four) times daily. Swiss and swallow   ondansetron (ZOFRAN) 4 MG tablet Take 4 mg by mouth every 8 (eight)  hours as needed for nausea or vomiting.   pantoprazole (PROTONIX) 40 MG tablet Take 40 mg by mouth every morning.   vitamin C (ASCORBIC ACID) 250 MG tablet Take 250 mg by mouth every morning.   No facility-administered encounter medications on file as of 08/01/2022.    I spent 40 minutes providing this consultation; this includes time spent with patient/family, chart review and documentation. More than 50% of the time in this consultation was spent on counseling and coordinating communication   Thank you for the opportunity to participate in the care of Ms. Elbe.  The palliative care team will continue to follow. Please call our office at [phone] if we can be of additional assistance.   Note: Portions of this note were generated with Scientist, clinical (histocompatibility and immunogenetics). Dictation errors may occur despite best attempts at proofreading.  Rosaura Carpenter, NP

## 2022-08-05 ENCOUNTER — Ambulatory Visit (INDEPENDENT_AMBULATORY_CARE_PROVIDER_SITE_OTHER): Payer: Medicare PPO

## 2022-08-05 DIAGNOSIS — I442 Atrioventricular block, complete: Secondary | ICD-10-CM | POA: Diagnosis not present

## 2022-08-05 LAB — CUP PACEART REMOTE DEVICE CHECK
Battery Impedance: 997 Ohm
Battery Remaining Longevity: 60 mo
Battery Voltage: 2.77 V
Brady Statistic AP VP Percent: 21 %
Brady Statistic AP VS Percent: 0 %
Brady Statistic AS VP Percent: 75 %
Brady Statistic AS VS Percent: 5 %
Date Time Interrogation Session: 20230815085806
Implantable Lead Implant Date: 20151111
Implantable Lead Implant Date: 20151111
Implantable Lead Location: 753859
Implantable Lead Location: 753860
Implantable Lead Model: 5076
Implantable Lead Model: 5076
Implantable Pulse Generator Implant Date: 20151111
Lead Channel Impedance Value: 495 Ohm
Lead Channel Impedance Value: 703 Ohm
Lead Channel Pacing Threshold Amplitude: 0.5 V
Lead Channel Pacing Threshold Amplitude: 1.375 V
Lead Channel Pacing Threshold Pulse Width: 0.4 ms
Lead Channel Pacing Threshold Pulse Width: 0.4 ms
Lead Channel Setting Pacing Amplitude: 1.5 V
Lead Channel Setting Pacing Amplitude: 2.5 V
Lead Channel Setting Pacing Pulse Width: 0.46 ms
Lead Channel Setting Sensing Sensitivity: 4 mV

## 2022-08-11 ENCOUNTER — Other Ambulatory Visit: Payer: Medicare PPO | Admitting: Hospice

## 2022-09-01 DIAGNOSIS — E7800 Pure hypercholesterolemia, unspecified: Secondary | ICD-10-CM | POA: Diagnosis not present

## 2022-09-01 DIAGNOSIS — I10 Essential (primary) hypertension: Secondary | ICD-10-CM | POA: Diagnosis not present

## 2022-09-01 DIAGNOSIS — F419 Anxiety disorder, unspecified: Secondary | ICD-10-CM | POA: Diagnosis not present

## 2022-09-01 DIAGNOSIS — R7989 Other specified abnormal findings of blood chemistry: Secondary | ICD-10-CM | POA: Diagnosis not present

## 2022-09-01 DIAGNOSIS — E559 Vitamin D deficiency, unspecified: Secondary | ICD-10-CM | POA: Diagnosis not present

## 2022-09-01 DIAGNOSIS — E039 Hypothyroidism, unspecified: Secondary | ICD-10-CM | POA: Diagnosis not present

## 2022-09-04 DIAGNOSIS — R82998 Other abnormal findings in urine: Secondary | ICD-10-CM | POA: Diagnosis not present

## 2022-09-08 DIAGNOSIS — I5032 Chronic diastolic (congestive) heart failure: Secondary | ICD-10-CM | POA: Diagnosis not present

## 2022-09-08 DIAGNOSIS — I2510 Atherosclerotic heart disease of native coronary artery without angina pectoris: Secondary | ICD-10-CM | POA: Diagnosis not present

## 2022-09-08 DIAGNOSIS — I447 Left bundle-branch block, unspecified: Secondary | ICD-10-CM | POA: Diagnosis not present

## 2022-09-08 DIAGNOSIS — Z1331 Encounter for screening for depression: Secondary | ICD-10-CM | POA: Diagnosis not present

## 2022-09-08 DIAGNOSIS — Z1339 Encounter for screening examination for other mental health and behavioral disorders: Secondary | ICD-10-CM | POA: Diagnosis not present

## 2022-09-08 DIAGNOSIS — Z0000 Encounter for general adult medical examination without abnormal findings: Secondary | ICD-10-CM | POA: Diagnosis not present

## 2022-09-08 DIAGNOSIS — I442 Atrioventricular block, complete: Secondary | ICD-10-CM | POA: Diagnosis not present

## 2022-09-08 DIAGNOSIS — I1310 Hypertensive heart and chronic kidney disease without heart failure, with stage 1 through stage 4 chronic kidney disease, or unspecified chronic kidney disease: Secondary | ICD-10-CM | POA: Diagnosis not present

## 2022-09-08 DIAGNOSIS — G20 Parkinson's disease: Secondary | ICD-10-CM | POA: Diagnosis not present

## 2022-09-08 DIAGNOSIS — N184 Chronic kidney disease, stage 4 (severe): Secondary | ICD-10-CM | POA: Diagnosis not present

## 2022-09-08 DIAGNOSIS — D631 Anemia in chronic kidney disease: Secondary | ICD-10-CM | POA: Diagnosis not present

## 2022-09-08 NOTE — Progress Notes (Signed)
Remote pacemaker transmission.   

## 2022-10-24 ENCOUNTER — Encounter (HOSPITAL_COMMUNITY): Payer: Self-pay

## 2022-10-24 ENCOUNTER — Ambulatory Visit (INDEPENDENT_AMBULATORY_CARE_PROVIDER_SITE_OTHER): Payer: Medicare PPO

## 2022-10-24 ENCOUNTER — Ambulatory Visit (HOSPITAL_COMMUNITY)
Admission: EM | Admit: 2022-10-24 | Discharge: 2022-10-24 | Disposition: A | Discharge status: Home / Self Care | Payer: Medicare PPO | Attending: Emergency Medicine | Admitting: Emergency Medicine

## 2022-10-24 DIAGNOSIS — M25531 Pain in right wrist: Secondary | ICD-10-CM

## 2022-10-24 DIAGNOSIS — M25532 Pain in left wrist: Secondary | ICD-10-CM

## 2022-10-24 DIAGNOSIS — M25432 Effusion, left wrist: Secondary | ICD-10-CM | POA: Diagnosis not present

## 2022-10-24 DIAGNOSIS — M1812 Unilateral primary osteoarthritis of first carpometacarpal joint, left hand: Secondary | ICD-10-CM

## 2022-10-24 NOTE — Discharge Instructions (Addendum)
Your xray has some chronic arthritic changes. These can contribute to your pain and swelling. I recommend to follow up with the hand specialists.  In the meantime, use tylneol for pain. Apply ice to the wrist for 20 minutes at a time, 3 times daily. Elevate the arm - prop up on a pillow to help reduce swelling.

## 2022-10-24 NOTE — ED Provider Notes (Signed)
[facility]    CSN: [id number] Arrival date & time: 10/24/22  1130     History   Chief Complaint Chief Complaint  Patient presents with   Wrist Pain    HPI Lauren Henry is a 81 y.o. female.  Presents with 4-week history of left wrist pain and swelling Denies injury or trauma Pain mostly with weightbearing, swelling has persisted She tried wrist brace for 5 days but felt that made it worse  PCP thought it was bursitis No medications taken   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; atrial fib (PAF)   History of blood transfusion as a child   no abnormal reaction noted   History of colon polyps    Hyperlipidemia    takes Pravastatin daily   Hypertension    takes Azor and Metoprolol daily   Hypothyroidism    takes Synthroid daily   Joint pain    Presence of permanent cardiac pacemaker    Urinary frequency    Urinary urgency     Patient Active Problem List   Diagnosis Date Noted   Angio-edema    Acute kidney injury superimposed on chronic kidney disease (HCC) 03/13/2022   Neck and right arm swelling 03/13/2022   Rash 03/13/2022   Dysphagia 03/13/2022   Swelling of right upper extremity 03/13/2022   Eye swelling, left 03/13/2022   Chronic diastolic CHF (congestive heart failure) (HCC) 03/13/2022   Protein-calorie malnutrition, severe 03/09/2022   Failure to thrive in adult 03/07/2022   Parkinsonian features 03/07/2022   Microscopic hematuria 03/07/2022   Stage 3b chronic kidney disease (CKD) (HCC) 02/21/2022   Syncope and collapse 02/20/2022   UTI (urinary tract infection) 02/20/2022   Syncope 02/20/2022   Postural dizziness with presyncope 02/18/2022   Weakness 02/17/2022   Orthostatic hypotension 02/17/2022   Glaucoma 02/17/2022   Gastroenteropathy 08/28/2020   AKI (acute kidney injury)  (HCC) 08/27/2020   Nausea vomiting and diarrhea 08/23/2020   Chronic anticoagulation 11/24/2018   CAD in native artery 11/24/2018   Chronic renal disease, stage III (HCC) 11/24/2018   Anemia 11/24/2018   Elevated troponin with new LBBB    Urinary urgency    Urinary frequency    Presence of permanent cardiac pacemaker    Joint pain    History of colon polyps    History of blood transfusion    Diarrhea    Arthritis    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   Preop cardiovascular exam 10/18/2015   Chest pain 11/09/2014   PAF (paroxysmal atrial fibrillation) (HCC) 11/09/2014   Essential hypertension 10/30/2014   Hyperlipidemia 10/30/2014   Hypothyroidism 10/30/2014   CHB (complete heart block) (HCC) s/p PPM 10/30/2014   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 Lewayne Bunting)   JOINT REPLACEMENT Right  knee   LEFT HEART CATH AND CORONARY ANGIOGRAPHY N/A 11/01/2018   Procedure: LEFT HEART CATH AND CORONARY ANGIOGRAPHY;  Surgeon: Yvonne Kendall, MD;  Location: [facility];  Service: Cardiovascular;  Laterality: N/A;   PERMANENT PACEMAKER INSERTION N/A 11/01/2014   Procedure: PERMANENT PACEMAKER INSERTION;  Surgeon: Marinus Maw, MD;  [facility] C[facility];  Service: Cardiovascular;  Laterality: N/A;   stomach stapled  1985   TOTAL KNEE ARTHROPLASTY Left 11/05/2015   Procedure: TOTAL KNEE ARTHROPLASTY;  Surgeon: Dannielle Huh, MD;  Location: [facility] OR;  Service: Orthopedics;  Laterality: Left;    OB History   No obstetric history on file.       Home Medications    Prior to Admission medications   Medication Sig Start Date End Date Taking? Authorizing Provider  apixaban (ELIQUIS) 2.5 MG TABS tablet Take 1 tablet (2.5 mg total) by mouth 2 (two) times daily. 02/18/22   Azucena Fallen, MD  Cholecalciferol (VITAMIN D3) 50 MCG (2000 UT) TABS Take 2,000 Units by mouth every morning.    [provider]  dorzolamide (TRUSOPT) 2 % ophthalmic solution Place 1 drop into the left eye 2 (two) times daily. 11/25/21   [provider]  ferrous sulfate 325 (65 FE) MG tablet Take 325 mg by mouth daily with breakfast.    [provider]  ipratropium (ATROVENT) 0.03 % nasal spray Place 2 sprays into both nostrils 2 (two) times daily as needed for rhinitis. 02/05/22   [provider]  levothyroxine (SYNTHROID, LEVOTHROID) 150 MCG tablet Take 150-225 mcg by mouth See admin instructions. Take one tablet (150 mcg) by mouth daily before breakfast, on 2 days of the week take an additional 1/2 tablet (75 mg) for a total dose on those days of 225 mcg 10/15/17   [provider]  metoprolol tartrate (LOPRESSOR) 25 MG tablet Take 0.5 tablets (12.5 mg total) by mouth 2 (two) times daily. 02/23/22   Ghimire, Werner Lean, MD  Multiple Vitamin (MULTIVITAMIN WITH MINERALS) TABS tablet Take 1 tablet by mouth every morning.    [provider]  nystatin (MYCOSTATIN) 100000 UNIT/ML suspension Take 5 mLs (500,000 Units total) by mouth 4 (four) times daily. Swiss and swallow 03/29/22   Dorcas Carrow, MD  ondansetron (ZOFRAN) 4 MG tablet Take 4 mg by mouth every 8 (eight) hours as needed for nausea or vomiting. 02/05/22   [provider]  pantoprazole (PROTONIX) 40 MG tablet Take 40 mg by mouth every morning. 11/10/18   [provider]  vitamin C (ASCORBIC ACID) 250 MG tablet Take 250 mg by mouth every morning.    [provider]    Family History Family History  Problem Relation Age of Onset    Heart disease Father    Heart attack Father    Stroke Sister     Social History Social History   Tobacco Use   Smoking status: Never   Smokeless tobacco: Never  Vaping Use   Vaping Use: Never used  Substance Use Topics   Alcohol use: Yes    Alcohol/week: 1.0 standard drink of alcohol    Types: 1 Glasses of wine per week   Drug use: No     Allergies   Clindamycin/lincomycin, Methimazole, Olmesartan, and Amlodipine   Review of Systems Review of Systems Per HPI  Physical Exam Triage Vital Signs ED Triage Vitals  Enc Vitals Group     BP 10/24/22 1214 (!) 175/96     Pulse Rate 10/24/22  1214 73     Resp 10/24/22 1214 16     Temp 10/24/22 1214 97.7 F (36.5 C)     Temp Source 10/24/22 1214 Oral     SpO2 10/24/22 1214 97 %     Weight --      Height --      Head Circumference --      Peak Flow --      Pain Score 10/24/22 1213 0     Pain Loc --      Pain Edu? --      Excl. in [place]? --    No data found.  Updated Vital Signs BP (!) 175/96 (BP Location: Left Arm)   Pulse 73   Temp 97.7 F (36.5 C) (Oral)   Resp 16   SpO2 97%    Physical Exam Vitals and nursing note reviewed.  Constitutional:      General: She is not in acute distress. HENT:     Mouth/Throat:     Pharynx: Oropharynx is clear.  Cardiovascular:     Rate and Rhythm: Normal rate and regular rhythm.     Pulses: Normal pulses.  Pulmonary:     Effort: Pulmonary effort is normal.     Breath sounds: Normal breath sounds.  Musculoskeletal:     Left wrist: Swelling and tenderness present. Normal pulse.     Comments: Area of swelling dorsal wrist. Tender CMC into wrist. Tender radial head. No warmth or erythema. Distal sensation intact  Skin:    General: Skin is warm and dry.     Capillary Refill: Capillary refill takes less than 2 seconds.     Findings: No bruising, erythema, lesion or rash.  Neurological:     Mental Status: She is alert and oriented to person, place, and time.      UC  Treatments / Results  Labs (all labs ordered are listed, but only abnormal results are displayed) Labs Reviewed - No data to display  EKG   Radiology DG Wrist Complete Left  Result Date: 10/24/2022 CLINICAL DATA:  Left wrist pain for 4 weeks.  Swelling. EXAM: LEFT WRIST - COMPLETE 3+ VIEW COMPARISON:  None Available. FINDINGS: No acute fracture or dislocation is identified. Severe first [place] joint space narrowing is noted with associated subchondral cyst formation, sclerosis, and prominent marginal osteophytosis. There is mild radiocarpal joint space narrowing with spurring of the distal radius and ulnar styloid. The soft tissues are unremarkable. IMPRESSION: 1. No acute osseous abnormality identified. 2. Severe first CMC osteoarthrosis. Electronically Signed   By: Logan Bores M.D.   On: 10/24/2022 13:15    Procedures Procedures   Medications Ordered in UC Medications - No data to display  Initial Impression / Assessment and Plan / UC Course  I have reviewed the triage vital signs and the nursing notes.  Pertinent labs & imaging results that were available during my care of the patient were reviewed by me and considered in my medical decision making (see chart for details).  Wrist xray with no acute abnormality, but severe CMC OA with subchondral cyst, sclerosis, osteophytosis  Recommend follow up with orthopedics In meantime try tylenol for pain Use ice, elevate the extremity Patient has tried brace and did not like it. Offered in clinic but declined Provided ortho/hand specialists, she has seen [facility] in the past and I recommend call to make appointment. Patient agrees to plan   Final Clinical Impressions(s) / UC Diagnoses   Final diagnoses:  Right  wrist pain  Wrist swelling, left  Primary osteoarthritis of first carpometacarpal joint of left hand     Discharge Instructions      Your xray has some chronic arthritic changes. These can contribute to your  pain and swelling. I recommend to follow up with the hand specialists.  In the meantime, use tylneol for pain. Apply ice to the wrist for 20 minutes at a time, 3 times daily. Elevate the arm - prop up on a pillow to help reduce swelling.     ED Prescriptions   None    PDMP not reviewed this encounter.   Eliyahu Bille, Ray Church 10/24/22 1434

## 2022-10-24 NOTE — ED Triage Notes (Signed)
Pt presents with c/o left wrist pain x-4 weeks. Pt has been seen by her PCP and dx with bursitis. Pt states pain is not improving. NKI.

## 2022-10-27 ENCOUNTER — Telehealth: Payer: Self-pay

## 2022-10-27 NOTE — Telephone Encounter (Signed)
(  4:41 pm) PC SW completed a telephone call to patient's husband-Lauren Henry to discuss patient status. He advised that patient is active and is doing just fine. He state that he is grateful for her progress. SW advised that palliative care will sign off at this time due to patient stable status. SW encouraged him to call back if patient has a change in condition/decline. He verbalized understanding and no other concerns noted.  *Palliative care is signing off effective this day, 10/27/22.

## 2022-11-04 ENCOUNTER — Ambulatory Visit (INDEPENDENT_AMBULATORY_CARE_PROVIDER_SITE_OTHER): Payer: Medicare PPO

## 2022-11-04 DIAGNOSIS — H40052 Ocular hypertension, left eye: Secondary | ICD-10-CM | POA: Diagnosis not present

## 2022-11-04 DIAGNOSIS — H04123 Dry eye syndrome of bilateral lacrimal glands: Secondary | ICD-10-CM | POA: Diagnosis not present

## 2022-11-04 DIAGNOSIS — I442 Atrioventricular block, complete: Secondary | ICD-10-CM | POA: Diagnosis not present

## 2022-11-04 LAB — CUP PACEART REMOTE DEVICE CHECK
Battery Impedance: 1105 Ohm
Battery Remaining Longevity: 55 mo
Battery Voltage: 2.76 V
Brady Statistic AP VP Percent: 22 %
Brady Statistic AP VS Percent: 0 %
Brady Statistic AS VP Percent: 73 %
Brady Statistic AS VS Percent: 4 %
Date Time Interrogation Session: 20231114090658
Implantable Lead Connection Status: 753985
Implantable Lead Connection Status: 753985
Implantable Lead Implant Date: 20151111
Implantable Lead Implant Date: 20151111
Implantable Lead Location: 753859
Implantable Lead Location: 753860
Implantable Lead Model: 5076
Implantable Lead Model: 5076
Implantable Pulse Generator Implant Date: 20151111
Lead Channel Impedance Value: 481 Ohm
Lead Channel Impedance Value: 662 Ohm
Lead Channel Pacing Threshold Amplitude: 0.5 V
Lead Channel Pacing Threshold Amplitude: 1.25 V
Lead Channel Pacing Threshold Pulse Width: 0.4 ms
Lead Channel Pacing Threshold Pulse Width: 0.4 ms
Lead Channel Setting Pacing Amplitude: 1.5 V
Lead Channel Setting Pacing Amplitude: 2.5 V
Lead Channel Setting Pacing Pulse Width: 0.46 ms
Lead Channel Setting Sensing Sensitivity: 4 mV
Zone Setting Status: 755011
Zone Setting Status: 755011

## 2022-11-20 NOTE — Progress Notes (Deleted)
Cardiology Office Note  Date:  05/26/2022   ID:  Lauren Henry, DOB 06/17/1941, MRN [id number]  PCP:  Lauren Pao, MD  Cardiologist:   Lauren Wilbourne Martin[place], MD   Chief Complaint  Patient presents with   Atrial Fibrillation   pacemaker      History of Present Illness: Lauren Henry is a 81 y.o. female  seen to establish follow up cardiac care. Previously seen by Lauren Henry. She has  complete heart block s/p PPM, paroxysmal atrial fibrillation, mild-moderate CAD, and hypertension.  [name] had a pacemaker implanted by Lauren Henry in 2015 due to complete heart block.  She was minimally symptomatic and reports that it was incidentally found when she presented for knee surgery.  She has difficult to manage hypertension.  She had  an echo 09/13/18 that revealed LVEF 45-50% with diffuse hypokinesis and severe mitral annular calcification.  A Lexiscan Myoview 10/07/18 that revealed a reversible defect in the inferior, apical and inferolateral regions.  This was associated with apical akinesis and inferoapical dyskinesis.  This was thought to be either ischemia or a pacemaker artifact.  Given that she was feeling well and has chronic kidney disease she elected medical management.  Clonidine was increased due to poorly controlled blood pressure.  She was admitted with hypertensive urgency.  Her blood pressure was as high as the 240s at home.  Troponin was mildly elevated and she underwent cardiac catheterization 10/2018 that revealed mild to moderate non-obstructive CAD.  There was some concern about the proximal LAD.  However FFR was unremarkable.  In February 2023, patient was admitted for postural dizziness with presyncope.  This was felt to be due to orthostatic hypotension in the setting of multiple antihypertensives and a decreased oral intake.  If she was treated for UTI at that time.  Since then, she had no further episode of syncope.  Her PCP placed her on a short course of prednisone in effort  to stimulate her appetite.  Follow-up lab work shows significantly elevated FTs, therefore patient was readmitted to the hospital on 03/07/2022.  Since December 2022, she lost roughly 30 pounds due to lack of appetite.  Cardiology service was consulted during the hospitalization due to elevated troponin and LBBB.  She denied any chest pain or worsening dyspnea.  She was seen by Lauren Henry.  Echocardiogram in March demonstrated EF 55 to 60%, no regional wall motion abnormality, moderate LVH, mild to moderate LAE, no significant valve issue.  Given the reassuring echocardiogram and the mildly elevated troponin in the setting of her CKD, it was unlikely that cardiac pathology was contributing to her failure to thrive.  During the hospitalization, patient also had parkinsonian features and resting tremors, MRI was contraindicated due to pacemaker.  Neurology recommended CT myelogram at some point as outpatient.  On subsequent outpatient visit her LFTs normalized.   She was readmitted 3/23-4/10/23 with acute angioedema with swelling involving her face, eye, neck and right arm. Required ventilator support. Treated with steroids and Pepcid with improvement. Cause of her angioedema was never determined.   She is seen today for follow up. She reports she feels very well. Denies any dyspnea, palpitations, chest pain. Is concerned about her BP. Currently just taking low dose metoprolol. She brings a diary. Had one BP reading of 0000000 systolic otherwise BP [id number] systolic. No edema. No facial swelling.    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, developed CHB 10/2014 s/p Medtronic PPM; atrial fib (PAF)   History of blood transfusion as a child   no abnormal reaction noted   History of colon polyps    Hyperlipidemia    takes Pravastatin daily   Hypertension    takes Azor and Metoprolol daily    Hypothyroidism    takes Synthroid daily   Joint pain    Presence of permanent cardiac pacemaker    Urinary frequency    Urinary urgency     Past Surgical History:  Procedure Laterality Date   ABDOMINAL HYSTERECTOMY     cataract surgery Bilateral    CHOLECYSTECTOMY  1971   COLONOSCOPY     EYE SURGERY     INSERT / REPLACE / REMOVE PACEMAKER     Medtronic PPM 11/01/14 (Lauren Henry)   JOINT REPLACEMENT Right    knee   LEFT HEART CATH AND CORONARY ANGIOGRAPHY N/A 11/01/2018   Procedure: LEFT HEART CATH AND CORONARY ANGIOGRAPHY;  Surgeon: Nelva Bush, MD;  Location: [place] [facility];  Service: Cardiovascular;  Laterality: N/A;   PERMANENT PACEMAKER INSERTION N/A 11/01/2014   Procedure: PERMANENT PACEMAKER INSERTION;  Surgeon: Evans Lance, MD;  Location: [facility] CATH LAB;  Service: Cardiovascular;  Laterality: N/A;   stomach stapled  1985   TOTAL KNEE ARTHROPLASTY Left 11/05/2015   Procedure: TOTAL KNEE ARTHROPLASTY;  Surgeon: Lauren Huger, MD;  Location: [place];  Service: Orthopedics;  Laterality: Left;     Current Outpatient Medications  Medication Sig Dispense Refill   apixaban (ELIQUIS) 2.5 MG TABS tablet Take 1 tablet (2.5 mg total) by mouth 2 (two) times daily. 60 tablet 0   Cholecalciferol (VITAMIN D3) 50 MCG (2000 UT) TABS Take 2,000 Units by mouth every morning.     dorzolamide (TRUSOPT) 2 % ophthalmic solution Place 1 drop into the left eye 2 (two) times daily.     ferrous sulfate 325 (65 FE) MG tablet Take 325 mg by mouth daily with breakfast.     ipratropium (ATROVENT) 0.03 % nasal spray Place 2 sprays into both nostrils 2 (two) times daily as needed for rhinitis.     levothyroxine (SYNTHROID, LEVOTHROID) 150 MCG tablet Take 150-225 mcg by mouth See admin instructions. Take one tablet (150 mcg) by mouth daily before breakfast, on 2 days of the week take an additional 1/2 tablet (75 mg) for a total dose on those days of 225 mcg  0   metoprolol tartrate (LOPRESSOR) 25  MG tablet Take 0.5 tablets (12.5 mg total) by mouth 2 (two) times daily. 60 tablet 3   Multiple Vitamin (MULTIVITAMIN WITH MINERALS) TABS tablet Take 1 tablet by mouth every morning.     nystatin (MYCOSTATIN) 100000 UNIT/ML suspension Take 5 mLs (500,000 Units total) by mouth 4 (four) times daily. Swiss and swallow 60 mL 0   ondansetron (ZOFRAN) 4 MG tablet Take 4 mg by mouth every 8 (eight) hours as needed for nausea or vomiting.     pantoprazole (PROTONIX) 40 MG tablet Take 40 mg by mouth every morning.  3   vitamin C (ASCORBIC ACID) 250 MG tablet Take 250 mg by mouth every morning.     No current facility-administered medications for this visit.    Allergies:   Clindamycin/lincomycin, Methimazole, Olmesartan, and Amlodipine    Social History:  The patient  reports that she has never smoked. She has never used smokeless tobacco.  She reports current alcohol use of about 1.0 standard drink per week. She reports that she does not use drugs.   Family History:  The patient's family history includes Heart attack in her father; Heart disease in her father; Stroke in her sister.    ROS:  Please see the history of present illness.   Otherwise, review of systems are positive for none.   All other systems are reviewed and negative.    PHYSICAL EXAM: VS:  BP (!) 165/88   Pulse 85   Ht 5\' 5"  (1.651 m)   Wt 179 lb 3.2 oz (81.3 kg)   SpO2 99%   BMI 29.82 kg/m  , BMI Body mass index is 29.82 kg/m. GENERAL:  Well appearing, overweight WF in NAD HEENT: Pupils equal round and reactive, fundi not visualized, oral mucosa unremarkable NECK:  No jugular venous distention, waveform within normal limits, carotid upstroke brisk and symmetric, no bruits LUNGS:  Clear to auscultation bilaterally HEART:  RRR.  PMI not displaced or sustained,S1 and S2 within normal limits, no S3, no S4, no clicks, no rubs, II/VI systolic murmur at LUSB ABD:  Flat, positive bowel sounds normal in frequency in pitch, no bruits,  no rebound, no guarding, no midline pulsatile mass, no hepatomegaly, no splenomegaly EXT:  2 plus pulses throughout, no edema, no cyanosis no clubbing SKIN:  No rashes no nodules NEURO:  Cranial nerves II through XII grossly intact, motor grossly intact throughout PSYCH:  Cognitively intact, oriented to person place and time   EKG:  EKG is not ordered today.    Echo 10/30/14: Study Conclusions  - Left ventricle: The cavity size was normal. There was mild focal   basal hypertrophy of the septum. Systolic function was normal.   The estimated ejection fraction was in the range of 60% to 65%.   Wall motion was normal; there were no regional wall motion   abnormalities. - Aortic valve: Trileaflet; mildly thickened, mildly calcified   leaflets. Transvalvular velocity was minimally increased. There   was no stenosis. - Mitral valve: Calcified annulus. There was mild regurgitation. - Right ventricle: The cavity size was mildly dilated. Wall   thickness was normal.   Echo 09/13/18: Study Conclusions   - Left ventricle: The cavity size was normal. There was mild focal   basal hypertrophy of the septum. Systolic function was mildly   reduced. The estimated ejection fraction was in the range of 45%   to 50%. Diffuse hypokinesis. Doppler parameters are consistent   with both elevated ventricular end-diastolic filling pressure and   elevated left atrial filling pressure. - Mitral valve: Severely calcified annulus. Mildly thickened,   mildly calcified leaflets . - Left atrium: The atrium was mildly dilated. - Atrial septum: No defect or patent foramen ovale was identified.   Lexiscan Myoview 10/07/18:   The left ventricular ejection fraction is mildly decreased (45-54%). Nuclear stress EF: 54%. No T wave inversion was noted during stress. There was no ST segment deviation noted during stress. Defect 1: There is a large defect of moderate severity.   Large size, moderate severity  partially reversible inferior, apical and inferolateral perfusion defect, suggestive of ischemia. This could also represent pacemaker-related artifact. There is apical akinesis and inferoapical dyskinesis. LVEF 54%. This is an intermediate risk study. Clinical correlation is advised.   LHC 10/2018: Conclusions: Mild to moderate, non-obstructive CAD.  Most severe lesion is a 50-60% stenosis in the proximal LCx that is not hemodynamically significant (DFR 1.0).  I  suspect mild troponin elevation reflects supply-demand mismatch in the setting of uncontrolled hypertension and renal insufficiency. Normal left ventricular filling pressure.   Recommendations: Medical therapy and risk factor modification to prevent progression of disease. Consider noninvasive evaluation for renal artery stenosis (if not already performed as an outpatient), given hypertension and renal insufficiency.   Echo 03/14/22: IMPRESSIONS     1. Left ventricular ejection fraction, by estimation, is 55 to 60%. The  left ventricle has normal function. The left ventricle has no regional  wall motion abnormalities. There is moderate concentric left ventricular  hypertrophy. Left ventricular  diastolic parameters are consistent with Grade I diastolic dysfunction  (impaired relaxation).   2. Right ventricular systolic function is normal. The right ventricular  size is normal. There is normal pulmonary artery systolic pressure.   3. Left atrial size was mild to moderately dilated.   4. The mitral valve is abnormal. No evidence of mitral valve  regurgitation.   5. The aortic valve is calcified. Aortic valve regurgitation is not  visualized. Mild aortic valve stenosis.   Recent Labs: 03/13/2022: B Natriuretic Peptide 551.8; TSH 3.233 03/25/2022: ALT 53; BUN 43; Creatinine, Ser 1.11; Hemoglobin 12.3; Magnesium 1.7; Platelets 148; Potassium 4.4; Sodium 138    Lipid Panel    Component Value Date/Time   CHOL 150 06/06/2021 0821    TRIG 158 (H) 03/18/2022 0226   HDL 51 06/06/2021 0821   CHOLHDL 2.9 06/06/2021 0821   CHOLHDL 2.3 10/30/2018 1852   VLDL 10 10/30/2018 1852   LDLCALC 85 06/06/2021 0821   07/22/2019: Total cholesterol 127, triglycerides 80, HDL 42, LDL 69 Dated 08/28/21: Hgb 11.2. creatinine 1.5. BUN 30, otherwise CBC and CMET normal. Cholesterol 129, triglycerides 64, HDL 41, LDL 75. TSH normal. Dated 09/01/22: cholesterol 219, triglycerides 89, HDL 58, LDL 143. CMET and TSH normal  Wt Readings from Last 3 Encounters:  05/26/22 179 lb 3.2 oz (81.3 kg)  03/31/22 169 lb 1.5 oz (76.7 kg)  03/12/22 169 lb (76.7 kg)      ASSESSMENT AND PLAN:  PAF:   Rate is stable.Continue metoprolol  and Eliquis. On reduced Eliquis due to age and fluctuating renal function Burden 0.9% by pacemaker follow up.   2.  Labile Hypertension:   Blood pressure is now under excellent control on current regimen. She is tolerating well. Continue. She will continue to monitor BP. I told her I would not be concerned with a single elevation. Only if BP is sustained high over a week.    3. Non-obstructive CAD: Left heart cath 10/2018 showed nonobstructive disease.  LDL was 72 on 08/2020.  Continue carvedilol and rosuvastatin.  She is not on aspirin given that she is on Eliquis.  4. Hyperlipidemia- no longer on statin. I would not resume at this point given all her issues this year. We can reconsider on follow up   5. CHB s/p PPM:   Managed by Lauren. Ladona Ridgel.     Current medicines are reviewed at length with the patient today.  The patient does not have concerns regarding medicines.  The following changes have been made: none  Labs/ tests ordered today include:   No orders of the defined types were placed in this encounter.     Disposition:  follow up in 6 months    Signed, Florencia Zaccaro [place] MD, [facility]  05/26/2022 1:58 PM    [facility]

## 2022-11-24 ENCOUNTER — Ambulatory Visit: Payer: Medicare PPO | Admitting: Cardiology

## 2022-12-02 NOTE — Progress Notes (Unsigned)
Cardiology Office Note:    Date:  12/03/2022   ID:  Lauren Henry, DOB [birth year], MRN [id number]  PCP:  Gaspar Garbe, MD [facility] Cardiologist: Peter Swaziland, MD   Reason for visit: 6 month follow-up  History of Present Illness:    Lauren Henry is a 81 y.o. female with a hx of complete heart block s/p PPM 2015, paroxysmal atrial fibrillation, mild-moderate CAD on LHC 2019, and hypertension.  Note she had acute angioedema in 02/2022 with swelling involving her face, eye, neck and right arm. Required ventilator support -patient states reaction was likely due to clindamycin given for possible strep.  Many of her medications were discontinued during this 3-week hospitalization including her antihypertensives.  She last saw Dr. Swaziland in June 2023.  She was doing well without chest pain or shortness of breath. BP overall controlled.  AF burden then 0.9%.  Today, patient states she is feeling well other than her arthritis.  She notes whitecoat hypertension.  She brings her blood pressure log with average blood pressure 110s to 130s over 60s and 70s.  Her blood pressure this morning at home was 132/68.  Her weight averages 173 to 175 pounds.  Her PCP mentioned to her that her bad cholesterol was elevated in September recommended we discuss treatment at today's visit.  Otherwise, she denies chest pain, shortness of breath, palpitations, PND, orthopnea and LE edema.  She says her thyroid has been stable.  She is currently halving Eliquis 5 mg and asked for a 2.5 mg prescription.    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; atrial fib (PAF)   History of blood transfusion as a child   no abnormal reaction noted   History of colon polyps    Hyperlipidemia    takes Pravastatin daily   Hypertension    takes Azor and Metoprolol  daily   Hypothyroidism    takes Synthroid daily   Joint pain    Presence of permanent cardiac pacemaker    Urinary frequency    Urinary urgency     Past Surgical History:  Procedure Laterality Date   ABDOMINAL HYSTERECTOMY     cataract surgery Bilateral    CHOLECYSTECTOMY  1971   COLONOSCOPY     EYE SURGERY     INSERT / REPLACE / REMOVE PACEMAKER     Medtronic PPM 11/01/14 (Dr. Lewayne Bunting)   JOINT REPLACEMENT Right    knee   LEFT HEART CATH AND CORONARY ANGIOGRAPHY N/A 11/01/2018   Procedure: LEFT HEART CATH AND CORONARY ANGIOGRAPHY;  Surgeon: Yvonne Kendall, MD;  Location: [facility] INVASIVE CV LAB;  Service: Cardiovascular;  Laterality: N/A;   PERMANENT PACEMAKER INSERTION N/A 11/01/2014   Procedure: PERMANENT PACEMAKER INSERTION;  Surgeon: Marinus Maw, MD;  Location: [facility] CATH LAB;  Service: Cardiovascular;  Laterality: N/A;   stomach stapled  1985   TOTAL KNEE ARTHROPLASTY Left 11/05/2015   Procedure: TOTAL KNEE ARTHROPLASTY;  Surgeon: Dannielle Huh, MD;  Location: [facility] OR;  Service: Orthopedics;  Laterality: Left;    Current Medications: Current Meds  Medication Sig   Cholecalciferol (VITAMIN D3) 50 MCG (2000 UT) TABS Take 2,000 Units by mouth every morning.   dorzolamide (TRUSOPT) 2 % ophthalmic solution Place 1 drop into the left eye 2 (two) times daily.  ferrous sulfate 325 (65 FE) MG tablet Take 325 mg by mouth daily with breakfast.   ipratropium (ATROVENT) 0.03 % nasal spray Place 2 sprays into both nostrils 2 (two) times daily as needed for rhinitis.   levothyroxine (SYNTHROID, LEVOTHROID) 150 MCG tablet Take 150-225 mcg by mouth See admin instructions. Take one tablet (150 mcg) by mouth daily before breakfast, on 2 days of the week take an additional 1/2 tablet (75 mg) for a total dose on those days of 225 mcg   metoprolol tartrate (LOPRESSOR) 25 MG tablet Take 0.5 tablets (12.5 mg total) by mouth 2 (two) times daily.   Multiple Vitamin (MULTIVITAMIN WITH MINERALS) TABS  tablet Take 1 tablet by mouth every morning.   pantoprazole (PROTONIX) 40 MG tablet Take 40 mg by mouth every morning.   rosuvastatin (CRESTOR) 40 MG tablet Take 1 tablet (40 mg total) by mouth daily.   vitamin C (ASCORBIC ACID) 250 MG tablet Take 250 mg by mouth every morning.   [DISCONTINUED] apixaban (ELIQUIS) 2.5 MG TABS tablet Take 1 tablet (2.5 mg total) by mouth 2 (two) times daily.     Allergies:   Clindamycin/lincomycin, Methimazole, Olmesartan, and Amlodipine   Social History   Socioeconomic History   Marital status: Married    Spouse name: Not on file   Number of children: Not on file   Years of education: Not on file   Highest education level: Not on file  Occupational History   Not on file  Tobacco Use   Smoking status: Never   Smokeless tobacco: Never  Vaping Use   Vaping Use: Never used  Substance and Sexual Activity   Alcohol use: Yes    Alcohol/week: 1.0 standard drink of alcohol    Types: 1 Glasses of wine per week   Drug use: No   Sexual activity: Not Currently    Birth control/protection: Surgical  Other Topics Concern   Not on file  Social History Narrative   ** Merged History Encounter **       Social Determinants of Health   Financial Resource Strain: Not on file  Food Insecurity: Not on file  Transportation Needs: Not on file  Physical Activity: Not on file  Stress: Not on file  Social Connections: Not on file     Family History: The patient's family history includes Heart attack in her father; Heart disease in her father; Stroke in her sister.  ROS:   Please see the history of present illness.     EKGs/Labs/Other Studies Reviewed:    EKG:  The ekg ordered today demonstrates A-sensed, v-paced, HR 68.  Recent Labs: 03/13/2022: B Natriuretic Peptide 551.8; TSH 3.233 03/25/2022: ALT 53; BUN 43; Creatinine, Ser 1.11; Hemoglobin 12.3; Magnesium 1.7; Platelets 148; Potassium 4.4; Sodium 138   Recent Lipid Panel Lab Results  Component Value  Date/Time   CHOL 150 06/06/2021 08:21 AM   TRIG 158 (H) 03/18/2022 02:26 AM   HDL 51 06/06/2021 08:21 AM   LDLCALC 85 06/06/2021 08:21 AM    Physical Exam:    VS:  BP (!) 162/80   Pulse 68   Ht 5\' 5"  (1.651 m)   Wt 175 lb 6.4 oz (79.6 kg)   SpO2 97%   BMI 29.19 kg/m    No data found.  HYPERTENSION CONTROL Vitals:   12/03/22 1130 12/03/22 1217  BP: (!) 150/80 (!) 162/80    The patient's blood pressure is elevated above target today.  In order to address the patient's elevated  BP: The blood pressure is usually elevated in clinic.  Blood pressures monitored at home have been optimal.       Wt Readings from Last 3 Encounters:  12/03/22 175 lb 6.4 oz (79.6 kg)  05/26/22 179 lb 3.2 oz (81.3 kg)  03/31/22 169 lb 1.5 oz (76.7 kg)     GEN:  Well nourished, well developed in no acute distress HEENT: Normal NECK: No JVD; No carotid bruits CARDIAC: RRR, systolic murmur  RESPIRATORY:  Clear to auscultation without rales, wheezing or rhonchi  ABDOMEN: Soft, non-tender, non-distended MUSCULOSKELETAL: No edema SKIN: Warm and dry NEUROLOGIC:  Alert and oriented PSYCHIATRIC:  Normal affect    ASSESSMENT AND PLAN   Paroxysmal atrial fibrillation, asymptomatic -PPM interrogation 10/2022: 0.6% AF -On lower dose Eliquis due to age and fluctuating renal function -sent prescription -Continue low-dose metoprolol  Nonobstructive coronary artery disease, no angina -By left heart cath November 2019 -Continue beta-blocker  -No given the need for anticoagulation  Complete heart block status post pacemaker -Followed by Dr. Ladona Ridgelaylor  Aortic stenosis -Mild on echo 02/2022 -No CP, SOB or syncope  Hypertension, well-controlled at home -Patient previously on carvedilol, hydralazine, clonidine and spironolactone.  Many of her medications were discontinued during her hospitalization for angioedema.  Her blood pressure has been well-controlled 110s to 130s over 60s and 70s despite not being  on these antihypertensives.   -Continue home monitoring with her Omron machine.  It is accurate compared to our reading.    Hyperlipidemia -Pt was previously on Crestor.  LDL 143 in 08/2022.   -Restart Crestor 40mg  daily with known CAD.    Disposition - Follow-up in 6 months.  Check fasting lipids prior.   Medication Adjustments/Labs and Tests Ordered: Current medicines are reviewed at length with the patient today.  Concerns regarding medicines are outlined above.  Orders Placed This Encounter  Procedures   Lipid panel   EKG 12-Lead   Meds ordered this encounter  Medications   apixaban (ELIQUIS) 2.5 MG TABS tablet    Sig: Take 1 tablet (2.5 mg total) by mouth 2 (two) times daily.    Dispense:  180 tablet    Refill:  3   rosuvastatin (CRESTOR) 40 MG tablet    Sig: Take 1 tablet (40 mg total) by mouth daily.    Dispense:  90 tablet    Refill:  3    Patient Instructions  Medication Instructions:  Your physician has recommended you make the following change in your medication:   -Restart rosuvastatin (crestor) 40mg  once daily.  *If you need a refill on your cardiac medications before your next appointment, please call your pharmacy*   Lab Work: Your physician recommends that you return for lab work in: at least 2-3 days prior to follow up visit for FASTING lipid panel  If you have labs (blood work) drawn today and your tests are completely normal, you will receive your results only by: MyChart Message (if you have MyChart) OR A paper copy in the mail If you have any lab test that is abnormal or we need to change your treatment, we will call you to review the results.   Follow-Up: At [facility], you and your health needs are our priority.  As part of our continuing mission to provide you with exceptional heart care, we have created designated Provider Care Teams.  These Care Teams include your primary Cardiologist (physician) and Advanced Practice Providers (APPs  -  Physician Assistants and Nurse Practitioners) who all  work together to provide you with the care you need, when you need it.  We recommend signing up for the patient portal called "MyChart".  Sign up information is provided on this After Visit Summary.  MyChart is used to connect with patients for Virtual Visits (Telemedicine).  Patients are able to view lab/test results, encounter notes, upcoming appointments, etc.  Non-urgent messages can be sent to your provider as well.   To learn more about what you can do with MyChart, go to [email or web address].    Your next appointment:   6 month(s)  The format for your next appointment:   In Person  Provider:   Peter Swazil[place], MD  or Juanda Crumble, PA-C       Signed, Cannon Kettle, PA-C  12/03/2022 12:20 PM    [facility]

## 2022-12-03 ENCOUNTER — Encounter: Payer: Self-pay | Admitting: Physician Assistant

## 2022-12-03 ENCOUNTER — Ambulatory Visit: Payer: Medicare PPO | Attending: Cardiology | Admitting: Physician Assistant

## 2022-12-03 VITALS — BP 162/80 | HR 68 | Ht 65.0 in | Wt 175.4 lb

## 2022-12-03 DIAGNOSIS — E7800 Pure hypercholesterolemia, unspecified: Secondary | ICD-10-CM | POA: Diagnosis not present

## 2022-12-03 DIAGNOSIS — I2510 Atherosclerotic heart disease of native coronary artery without angina pectoris: Secondary | ICD-10-CM

## 2022-12-03 DIAGNOSIS — I480 Paroxysmal atrial fibrillation: Secondary | ICD-10-CM | POA: Diagnosis not present

## 2022-12-03 DIAGNOSIS — I10 Essential (primary) hypertension: Secondary | ICD-10-CM | POA: Diagnosis not present

## 2022-12-03 MED ORDER — ROSUVASTATIN CALCIUM 40 MG PO TABS: 40.0000 mg | ORAL_TABLET | Freq: Every day | ORAL | 3 refills | Status: DC

## 2022-12-03 MED ORDER — APIXABAN 2.5 MG PO TABS: 2.5000 mg | ORAL_TABLET | Freq: Two times a day (BID) | ORAL | 3 refills | Status: AC

## 2022-12-03 NOTE — Progress Notes (Signed)
Remote pacemaker transmission.   

## 2022-12-03 NOTE — Patient Instructions (Signed)
Medication Instructions:  Your physician has recommended you make the following change in your medication:   -Restart rosuvastatin (crestor) 40mg  once daily.  *If you need a refill on your cardiac medications before your next appointment, please call your pharmacy*   Lab Work: Your physician recommends that you return for lab work in: at least 2-3 days prior to follow up visit for FASTING lipid panel  If you have labs (blood work) drawn today and your tests are completely normal, you will receive your results only by: MyChart Message (if you have MyChart) OR A paper copy in the mail If you have any lab test that is abnormal or we need to change your treatment, we will call you to review the results.   Follow-Up: At [facility], you and your health needs are our priority.  As part of our continuing mission to provide you with exceptional heart care, we have created designated Provider Care Teams.  These Care Teams include your primary Cardiologist (physician) and Advanced Practice Providers (APPs -  Physician Assistants and Nurse Practitioners) who all work together to provide you with the care you need, when you need it.  We recommend signing up for the patient portal called "MyChart".  Sign up information is provided on this After Visit Summary.  MyChart is used to connect with patients for Virtual Visits (Telemedicine).  Patients are able to view lab/test results, encounter notes, upcoming appointments, etc.  Non-urgent messages can be sent to your provider as well.   To learn more about what you can do with MyChart, go to [facility].    Your next appointment:   6 month(s)  The format for your next appointment:   In Person  Provider:   [email or web address], MD  or [place], PA-C

## 2023-02-03 ENCOUNTER — Ambulatory Visit: Payer: Medicare PPO

## 2023-02-03 DIAGNOSIS — I442 Atrioventricular block, complete: Secondary | ICD-10-CM

## 2023-02-04 LAB — CUP PACEART REMOTE DEVICE CHECK
Battery Impedance: 1241 Ohm
Battery Remaining Longevity: 51 mo
Battery Voltage: 2.76 V
Brady Statistic AP VP Percent: 24 %
Brady Statistic AP VS Percent: 0 %
Brady Statistic AS VP Percent: 72 %
Brady Statistic AS VS Percent: 4 %
Date Time Interrogation Session: 20240214125612
Implantable Lead Connection Status: 753985
Implantable Lead Connection Status: 753985
Implantable Lead Implant Date: 20151111
Implantable Lead Implant Date: 20151111
Implantable Lead Location: 753859
Implantable Lead Location: 753860
Implantable Lead Model: 5076
Implantable Lead Model: 5076
Implantable Pulse Generator Implant Date: 20151111
Lead Channel Impedance Value: 495 Ohm
Lead Channel Impedance Value: 623 Ohm
Lead Channel Pacing Threshold Amplitude: 0.5 V
Lead Channel Pacing Threshold Amplitude: 1.5 V
Lead Channel Pacing Threshold Pulse Width: 0.4 ms
Lead Channel Pacing Threshold Pulse Width: 0.4 ms
Lead Channel Setting Pacing Amplitude: 1.5 V
Lead Channel Setting Pacing Amplitude: 2.5 V
Lead Channel Setting Pacing Pulse Width: 0.46 ms
Lead Channel Setting Sensing Sensitivity: 4 mV
Zone Setting Status: 755011
Zone Setting Status: 755011

## 2023-02-20 ENCOUNTER — Emergency Department (HOSPITAL_BASED_OUTPATIENT_CLINIC_OR_DEPARTMENT_OTHER): Payer: Medicare PPO

## 2023-02-20 ENCOUNTER — Encounter (HOSPITAL_BASED_OUTPATIENT_CLINIC_OR_DEPARTMENT_OTHER): Payer: Self-pay | Admitting: Emergency Medicine

## 2023-02-20 ENCOUNTER — Emergency Department (HOSPITAL_BASED_OUTPATIENT_CLINIC_OR_DEPARTMENT_OTHER)
Admission: EM | Admit: 2023-02-20 | Discharge: 2023-02-21 | Disposition: A | Discharge status: Home / Self Care | Payer: Medicare PPO | Attending: Emergency Medicine | Admitting: Emergency Medicine

## 2023-02-20 ENCOUNTER — Other Ambulatory Visit: Payer: Self-pay

## 2023-02-20 DIAGNOSIS — S43005A Unspecified dislocation of left shoulder joint, initial encounter: Secondary | ICD-10-CM | POA: Diagnosis not present

## 2023-02-20 DIAGNOSIS — W19XXXA Unspecified fall, initial encounter: Secondary | ICD-10-CM | POA: Diagnosis not present

## 2023-02-20 DIAGNOSIS — S0083XA Contusion of other part of head, initial encounter: Secondary | ICD-10-CM | POA: Diagnosis not present

## 2023-02-20 DIAGNOSIS — I10 Essential (primary) hypertension: Secondary | ICD-10-CM | POA: Diagnosis not present

## 2023-02-20 DIAGNOSIS — S43015A Anterior dislocation of left humerus, initial encounter: Secondary | ICD-10-CM | POA: Diagnosis not present

## 2023-02-20 DIAGNOSIS — Z79899 Other long term (current) drug therapy: Secondary | ICD-10-CM | POA: Diagnosis not present

## 2023-02-20 DIAGNOSIS — S43085A Other dislocation of left shoulder joint, initial encounter: Secondary | ICD-10-CM | POA: Diagnosis not present

## 2023-02-20 DIAGNOSIS — S4992XA Unspecified injury of left shoulder and upper arm, initial encounter: Secondary | ICD-10-CM | POA: Diagnosis present

## 2023-02-20 DIAGNOSIS — Z7901 Long term (current) use of anticoagulants: Secondary | ICD-10-CM | POA: Insufficient documentation

## 2023-02-20 MED ORDER — FENTANYL CITRATE PF 50 MCG/ML IJ SOSY
50.0000 ug | PREFILLED_SYRINGE | Freq: Once | INTRAMUSCULAR | Status: AC
  Administered 2023-02-20: 50 ug via INTRAVENOUS
  Filled 2023-02-20: qty 1

## 2023-02-20 MED ORDER — PROPOFOL 10 MG/ML IV BOLUS
1.0000 mg/kg | Freq: Once | INTRAVENOUS | Status: AC
Start: 2023-02-20 — End: 2023-02-20
  Administered 2023-02-20: 60 mg via INTRAVENOUS
  Filled 2023-02-20: qty 20

## 2023-02-20 NOTE — ED Notes (Signed)
MD at bedside attempting to put shoulder back in place with RN and EDT assistance. Pt reports relief in pain. Respirations are equal and nonlabored. Skin warm and dry.

## 2023-02-20 NOTE — ED Notes (Signed)
Dr. Kathrynn Humble back at bedside to attempt to put shoulder back in place with RN assistance. Pt tolerated procedure well. Respirations are equal and nonlabored. Skin warm and dry.

## 2023-02-20 NOTE — ED Triage Notes (Signed)
Patient reports falling down 1 step, falling on left side.  Patient endorses left arm pain, bilateral knee pain, and laceration to the left side of her face.  Patient denies LOC.  Patient takes eliquis.

## 2023-02-21 ENCOUNTER — Emergency Department (HOSPITAL_BASED_OUTPATIENT_CLINIC_OR_DEPARTMENT_OTHER): Payer: Medicare PPO

## 2023-02-21 DIAGNOSIS — R519 Headache, unspecified: Secondary | ICD-10-CM | POA: Diagnosis not present

## 2023-02-21 DIAGNOSIS — M25412 Effusion, left shoulder: Secondary | ICD-10-CM | POA: Diagnosis not present

## 2023-02-21 MED ORDER — ACETAMINOPHEN 500 MG PO TABS: 500.0000 mg | ORAL_TABLET | Freq: Four times a day (QID) | ORAL | 0 refills | Status: AC | PRN

## 2023-02-21 NOTE — ED Notes (Addendum)
Two rings (one gray without stone and one gray with white stone) removed from L 4th digit and given to husband, Karyne Cunniff, at bedside.

## 2023-02-21 NOTE — Discharge Instructions (Addendum)
We saw you in the ER after you had a fall.  The shoulder was dislocated and has been reduced. Call the orthopedist at the number provided and follow-up with them in 10 to 14 days.  The CT scan of the brain is reassuring.  Apply ice to the shoulder and also to your face.

## 2023-02-21 NOTE — ED Notes (Signed)
Return from CT via stretcher by RN escort.

## 2023-02-21 NOTE — ED Notes (Signed)
Pt tolerated PO intake without difficulty. Able to ambulate in hall without assistance. Dr. Dina Rich notified.

## 2023-02-21 NOTE — ED Notes (Signed)
Patient unable to complete CT scan d/t shoulder dislocation.  Per MD Nanavati upon evaluation in CT, patient ok to have shoulder reduced first in order to complete CT scan.

## 2023-02-21 NOTE — ED Notes (Signed)
Pt awake and alert, able to move all extremities and follow commands.

## 2023-02-21 NOTE — ED Notes (Signed)
RT setup BVM, ETCO2 [place] 4 Lpm placed on pt, airway card outside room. Pt preoxygenated for conscious/procedural sedation. Pt respiratory status stable at this time on ETCO2 [place] 4lpm reading of 19 BLBS clear/dim. RT will continue to monitor throughout procedure.

## 2023-02-21 NOTE — ED Provider Notes (Addendum)
Requested to assist with sedation for shoulder reduction of left anterior inferior dislocation.  Post shoulder reduction films show good relocation.  CT scan does not show any evidence of bleed.  Patient is awake and alert.  She is ambulatory and able to tolerate fluids at discharge.  Patient discharged per Dr. Andree Elk discharge instructions.   Physical Exam  BP (!) 154/95 (BP Location: Right Arm)   Pulse 66   Temp 97.7 F (36.5 C) (Oral)   Resp (!) 21   Ht 1.651 m ('5\' 5"'$ )   Wt 78.9 kg   SpO2 99%   BMI 28.96 kg/m   Physical Exam Elderly, nontoxic, uncomfortable appearing No respiratory compromise, clear breath sounds Procedures  .Sedation  Date/Time: 02/21/2023 12:07 AM  Performed by: Merryl Hacker, MD Authorized by: Merryl Hacker, MD   Consent:    Consent obtained:  Written   Consent given by:  Patient   Risks discussed:  Allergic reaction, prolonged hypoxia resulting in organ damage, prolonged sedation necessitating reversal and respiratory compromise necessitating ventilatory assistance and intubation   Alternatives discussed:  Analgesia without sedation Universal protocol:    Immediately prior to procedure, a time out was called: yes   Indications:    Procedure performed:  Dislocation reduction   Procedure necessitating sedation performed by:  Different physician Pre-sedation assessment:    Time since last food or drink:  N/A   NPO status caution: urgency dictates proceeding with non-ideal NPO status     ASA classification: class 3 - patient with severe systemic disease     Mouth opening:  2 finger widths   Thyromental distance:  3 finger widths   Mallampati score:  III - soft palate, base of uvula visible   Neck mobility: reduced     Pre-sedation assessments completed and reviewed: airway patency, cardiovascular function, hydration status, mental status, nausea/vomiting and pain level     Pre-sedation assessment completed:  02/20/2023 11:58 PM Immediate  pre-procedure details:    Reassessment: Patient reassessed immediately prior to procedure     Reviewed: vital signs     Verified: bag valve mask available, emergency equipment available, intubation equipment available and IV patency confirmed   Procedure details (see MAR for exact dosages):    Preoxygenation:  Nasal cannula   Sedation:  Propofol   Intra-procedure monitoring:  Blood pressure monitoring, continuous capnometry, cardiac monitor, continuous pulse oximetry, frequent LOC assessments and frequent vital sign checks   Intra-procedure events: none     Total Provider sedation time (minutes):  10 Post-procedure details:    Post-sedation assessment completed:  02/21/2023 12:09 AM   Attendance: Constant attendance by certified staff until patient recovered     Recovery: Patient returned to pre-procedure baseline     Post-sedation assessments completed and reviewed: airway patency, cardiovascular function, hydration status, mental status, nausea/vomiting and pain level     Patient is stable for discharge or admission: yes     Procedure completion:  Tolerated well, no immediate complications   ED Course / MDM    Medical Decision Making Amount and/or Complexity of Data Reviewed Radiology: ordered.  Risk OTC drugs. Prescription drug management.     Problem List Items Addressed This Visit   None Visit Diagnoses     Shoulder dislocation, left, initial encounter    -  Primary           Merryl Hacker, MD 02/21/23 0010    Merryl Hacker, MD 02/21/23 [phone]

## 2023-02-21 NOTE — ED Provider Notes (Signed)
[facility] Provider Note   CSN: [id number] Arrival date & time: 02/20/23  2121     History  Chief Complaint  Patient presents with   Ernestyne Krenzke is a 82 y.o. female.  HPI    82 year old female comes in with chief complaint of fall. Patient was being assisted by her husband down the stairs, when she lost her footing and fell forward.  In the process patient struck her face.  She also had an outstretched arm to break the fall, and now she is having significant pain over her left shoulder.   Patient has history of hypertension, hyperlipidemia, paroxysmal A-fib and she is on Eliquis for it.  She denies severe headache, she is having face pain.  She denies any neck pain, numbness, tingling, nausea, vomiting, chest pain, shortness of breath and vision change.  Home Medications Prior to Admission medications   Medication Sig Start Date End Date Taking? Authorizing Provider  acetaminophen (TYLENOL) 500 MG tablet Take 1 tablet (500 mg total) by mouth every 6 (six) hours as needed. 02/21/23  Yes Varney Biles, MD  apixaban (ELIQUIS) 2.5 MG TABS tablet Take 1 tablet (2.5 mg total) by mouth 2 (two) times daily. 12/03/22   Warren Lacy, PA-C  Cholecalciferol (VITAMIN D3) 50 MCG (2000 UT) TABS Take 2,000 Units by mouth every morning.    [provider]  dorzolamide (TRUSOPT) 2 % ophthalmic solution Place 1 drop into the left eye 2 (two) times daily. 11/25/21   [provider]  ferrous sulfate 325 (65 FE) MG tablet Take 325 mg by mouth daily with breakfast.    [provider]  ipratropium (ATROVENT) 0.03 % nasal spray Place 2 sprays into both nostrils 2 (two) times daily as needed for rhinitis. 02/05/22   [provider]  levothyroxine (SYNTHROID, LEVOTHROID) 150 MCG tablet Take 150-225 mcg by mouth See admin instructions. Take one tablet (150 mcg) by mouth daily before breakfast, on 2 days of the week  take an additional 1/2 tablet (75 mg) for a total dose on those days of 225 mcg 10/15/17   [provider]  metoprolol tartrate (LOPRESSOR) 25 MG tablet Take 0.5 tablets (12.5 mg total) by mouth 2 (two) times daily. 02/23/22   Ghimire, Henreitta Leber, MD  Multiple Vitamin (MULTIVITAMIN WITH MINERALS) TABS tablet Take 1 tablet by mouth every morning.    [provider]  ondansetron (ZOFRAN) 4 MG tablet Take 4 mg by mouth every 8 (eight) hours as needed for nausea or vomiting. Patient not taking: Reported on 12/03/2022 02/05/22   [provider]  pantoprazole (PROTONIX) 40 MG tablet Take 40 mg by mouth every morning. 11/10/18   [provider]  rosuvastatin (CRESTOR) 40 MG tablet Take 1 tablet (40 mg total) by mouth daily. 12/03/22   Warren Lacy, PA-C  vitamin C (ASCORBIC ACID) 250 MG tablet Take 250 mg by mouth every morning.    [provider]      Allergies    Clindamycin/lincomycin, Methimazole, Olmesartan, and Amlodipine    Review of Systems   Review of Systems  Physical Exam Updated Vital Signs BP (!) 153/66   Pulse 99   Temp 98.3 F (36.8 C)   Resp 18   Ht '5\' 5"'$  (1.651 m)   Wt 78.9 kg   SpO2 100%   BMI 28.96 kg/m  Physical Exam Vitals and nursing note reviewed.  Constitutional:  Appearance: She is well-developed.  HENT:     Head:     Comments: Left facial ecchymosis, extraocular muscles are intact, visual fields are intact. Eyes:     Extraocular Movements: Extraocular movements intact.     Pupils: Pupils are equal, round, and reactive to light.  Cardiovascular:     Rate and Rhythm: Normal rate.  Pulmonary:     Effort: Pulmonary effort is normal.  Musculoskeletal:        General: Swelling, tenderness and deformity present.     Cervical back: Normal range of motion and neck supple.     Comments: Left shoulder tenderness with gross deformity  Skin:    General: Skin is dry.  Neurological:     Mental Status: She is  alert and oriented to person, place, and time.     ED Results / Procedures / Treatments   Labs (all labs ordered are listed, but only abnormal results are displayed) Labs Reviewed - No data to display  EKG None  Radiology CT Head Wo Contrast  Result Date: 02/21/2023 CLINICAL DATA:  Fall downstairs with headaches, initial encounter EXAM: CT HEAD WITHOUT CONTRAST TECHNIQUE: Contiguous axial images were obtained from the base of the skull through the vertex without intravenous contrast. RADIATION DOSE REDUCTION: This exam was performed according to the departmental dose-optimization program which includes automated exposure control, adjustment of the mA and/or kV according to patient size and/or use of iterative reconstruction technique. COMPARISON:  03/25/2022 FINDINGS: Brain: No evidence of acute infarction, hemorrhage, hydrocephalus, extra-axial collection or mass lesion/mass effect. Chronic atrophic changes are again seen. Vascular: No hyperdense vessel or unexpected calcification. Skull: Normal. Negative for fracture or focal lesion. Sinuses/Orbits: Chronic inflammatory change in the right maxillary antrum is seen. Other: None. IMPRESSION: Chronic atrophic changes without acute abnormality. Electronically Signed   By: Inez Catalina M.D.   On: 02/21/2023 00:26   DG Shoulder Left  Result Date: 02/21/2023 CLINICAL DATA:  Status post reduction EXAM: LEFT SHOULDER - 1 VIEW COMPARISON:  None Available. FINDINGS: Left humeral head is been reduced. No acute fracture is noted. Persistent degenerative changes of the acromioclavicular joint are seen. IMPRESSION: Status post humeral head reduction. Electronically Signed   By: Inez Catalina M.D.   On: 02/21/2023 00:25   DG Humerus Left  Result Date: 02/20/2023 CLINICAL DATA:  Recent fall with left shoulder pain, initial encounter EXAM: LEFT HUMERUS - 1 VIEW COMPARISON:  None Available. FINDINGS: There is anterior inferior dislocation of the humeral head with  respect the glenoid. Degenerative changes of the acromioclavicular joint are noted. No fracture of the humerus is seen. Small bony densities are noted adjacent to the glenoid which may be chronic in nature. IMPRESSION: Left humeral head dislocation as described. Electronically Signed   By: Inez Catalina M.D.   On: 02/20/2023 22:18    Procedures Reduction of dislocation  Date/Time: 02/21/2023 3:11 PM  Performed by: Varney Biles, MD Authorized by: Varney Biles, MD  Consent: Written consent obtained. Risks and benefits: risks, benefits and alternatives were discussed Consent given by: patient Patient understanding: patient states understanding of the procedure being performed Patient consent: the patient's understanding of the procedure matches consent given Procedure consent: procedure consent matches procedure scheduled Relevant documents: relevant documents present and verified Test results: test results available and properly labeled Site marked: the operative site was marked Imaging studies: imaging studies available Required items: required blood products, implants, devices, and special equipment available Patient identity confirmed: arm band Time out: Immediately prior  to procedure a "time out" was called to verify the correct patient, procedure, equipment, support staff and site/side marked as required. Local anesthesia used: no  Anesthesia: Local anesthesia used: no Patient tolerance: patient tolerated the procedure well with no immediate complications Comments: Sedation was completed by another physician, see separate note by them.       Medications Ordered in ED Medications  fentaNYL (SUBLIMAZE) injection 50 mcg (50 mcg Intravenous Given 02/20/23 2215)  propofol (DIPRIVAN) 10 mg/mL bolus/IV push 78.9 mg (60 mg Intravenous Given 02/20/23 2351)    ED Course/ Medical Decision Making/ A&P                             Medical Decision Making Amount and/or Complexity of  Data Reviewed Radiology: ordered.  Risk OTC drugs. Prescription drug management.   82 year old female with history of A-fib on Eliquis comes in with chief complaint of mechanical fall.  She is complaining of severe left-sided shoulder pain.  She is neurovascularly intact.  On my exam, patient has left facial ecchymosis and contusion.  No gross neurodeficits, extraocular muscles are intact, visual fields are intact.  I do not think there is any orbital fracture that is clinically significant, CT scan of the face not indicated.  CT scan of the brain has been ordered along with x-ray of the shoulder.  History provided by patient's spouse who is also at the bedside.  Reassessment: X-ray independently interpreted.  There is clear evidence of anterior dislocation of the left shoulder. I tried to reduce the shoulder without sedation, but we were unsuccessful.  We proceeded to reduce with sedation and successfully reduce the joint.   Final Clinical Impression(s) / ED Diagnoses Final diagnoses:  Shoulder dislocation, left, initial encounter    Rx / DC Orders ED Discharge Orders          Ordered    acetaminophen (TYLENOL) 500 MG tablet  Every 6 hours PRN        02/21/23 Jinger Neighbors, MD 02/21/23 1514

## 2023-02-21 NOTE — ED Notes (Signed)
Patient transported to CT by Mirna Mires, RN

## 2023-02-21 NOTE — ED Notes (Signed)
Pt taken off of ETCO2 [place] by RN, pt sats 97% on RA w/no distress noted at this time. BLBS clear/dim at this time.

## 2023-02-21 NOTE — ED Notes (Signed)
Pt ETCO2 [place] titrated down to 3 Lpm w/sats of 100%. Pt respiratory status remains stable at this time. RT will continue to monitor while at [facility] ED.

## 2023-02-23 ENCOUNTER — Ambulatory Visit: Payer: Medicare PPO | Admitting: Family Medicine

## 2023-02-25 DIAGNOSIS — I252 Old myocardial infarction: Secondary | ICD-10-CM | POA: Diagnosis not present

## 2023-02-25 DIAGNOSIS — D6869 Other thrombophilia: Secondary | ICD-10-CM | POA: Diagnosis not present

## 2023-02-25 DIAGNOSIS — D631 Anemia in chronic kidney disease: Secondary | ICD-10-CM | POA: Diagnosis not present

## 2023-02-25 DIAGNOSIS — Z950 Presence of cardiac pacemaker: Secondary | ICD-10-CM | POA: Diagnosis not present

## 2023-02-25 DIAGNOSIS — Z7901 Long term (current) use of anticoagulants: Secondary | ICD-10-CM | POA: Diagnosis not present

## 2023-02-25 DIAGNOSIS — E7800 Pure hypercholesterolemia, unspecified: Secondary | ICD-10-CM | POA: Diagnosis not present

## 2023-02-25 DIAGNOSIS — E039 Hypothyroidism, unspecified: Secondary | ICD-10-CM | POA: Diagnosis not present

## 2023-02-25 DIAGNOSIS — I1310 Hypertensive heart and chronic kidney disease without heart failure, with stage 1 through stage 4 chronic kidney disease, or unspecified chronic kidney disease: Secondary | ICD-10-CM | POA: Diagnosis not present

## 2023-02-25 DIAGNOSIS — I442 Atrioventricular block, complete: Secondary | ICD-10-CM | POA: Diagnosis not present

## 2023-02-25 DIAGNOSIS — I2510 Atherosclerotic heart disease of native coronary artery without angina pectoris: Secondary | ICD-10-CM | POA: Diagnosis not present

## 2023-02-25 DIAGNOSIS — N184 Chronic kidney disease, stage 4 (severe): Secondary | ICD-10-CM | POA: Diagnosis not present

## 2023-02-26 ENCOUNTER — Telehealth: Payer: Self-pay

## 2023-02-26 NOTE — Telephone Encounter (Signed)
        Patient  visited [facility] on 02/21/2023  for fall, shoulder dislocation, left.   Telephone encounter attempt :  1st  A HIPAA compliant voice message was left requesting a return call.  Instructed patient to call back at [phone].   [place] Resource Care Guide   [email or web address]  ?? [phone]   Website: [email or web address]  [email or web address]

## 2023-03-03 ENCOUNTER — Telehealth: Payer: Self-pay

## 2023-03-03 DIAGNOSIS — S43085A Other dislocation of left shoulder joint, initial encounter: Secondary | ICD-10-CM | POA: Diagnosis not present

## 2023-03-03 NOTE — Telephone Encounter (Signed)
        Patient  visited [facility] on 02/21/2023  for fall, shoulder dislocation, left.   Telephone encounter attempt :  2nd  A HIPAA compliant voice message was left requesting a return call.  Instructed patient to call back at [phone].   [place] Resource Care Guide   [email or web address]  ?? [phone]   Website: [email or web address]  [email or web address]

## 2023-03-05 ENCOUNTER — Telehealth: Payer: Self-pay

## 2023-03-05 NOTE — Telephone Encounter (Signed)
        Patient  visited [facility] on 02/21/2023  for fall, shoulder dislocation, left.   Telephone encounter attempt :  3rd  A HIPAA compliant voice message was left requesting a return call.  Instructed patient to call back at [phone].   [place] Resource Care Guide   [email or web address]  ?? [phone]   Website: [email or web address]  [email or web address]

## 2023-03-09 NOTE — Progress Notes (Signed)
Remote pacemaker transmission.   

## 2023-03-17 DIAGNOSIS — S43085D Other dislocation of left shoulder joint, subsequent encounter: Secondary | ICD-10-CM | POA: Diagnosis not present

## 2023-03-17 DIAGNOSIS — S6422XD Injury of radial nerve at wrist and hand level of left arm, subsequent encounter: Secondary | ICD-10-CM | POA: Diagnosis not present

## 2023-03-20 DIAGNOSIS — I4891 Unspecified atrial fibrillation: Secondary | ICD-10-CM | POA: Diagnosis not present

## 2023-03-20 DIAGNOSIS — I10 Essential (primary) hypertension: Secondary | ICD-10-CM | POA: Diagnosis not present

## 2023-03-20 DIAGNOSIS — H409 Unspecified glaucoma: Secondary | ICD-10-CM | POA: Diagnosis not present

## 2023-03-20 DIAGNOSIS — E785 Hyperlipidemia, unspecified: Secondary | ICD-10-CM | POA: Diagnosis not present

## 2023-03-20 DIAGNOSIS — E039 Hypothyroidism, unspecified: Secondary | ICD-10-CM | POA: Diagnosis not present

## 2023-03-20 DIAGNOSIS — M1990 Unspecified osteoarthritis, unspecified site: Secondary | ICD-10-CM | POA: Diagnosis not present

## 2023-03-20 DIAGNOSIS — G5632 Lesion of radial nerve, left upper limb: Secondary | ICD-10-CM | POA: Diagnosis not present

## 2023-03-20 DIAGNOSIS — K219 Gastro-esophageal reflux disease without esophagitis: Secondary | ICD-10-CM | POA: Diagnosis not present

## 2023-03-20 DIAGNOSIS — S43005D Unspecified dislocation of left shoulder joint, subsequent encounter: Secondary | ICD-10-CM | POA: Diagnosis not present

## 2023-03-23 DIAGNOSIS — G5632 Lesion of radial nerve, left upper limb: Secondary | ICD-10-CM | POA: Diagnosis not present

## 2023-03-23 DIAGNOSIS — I4891 Unspecified atrial fibrillation: Secondary | ICD-10-CM | POA: Diagnosis not present

## 2023-03-23 DIAGNOSIS — H409 Unspecified glaucoma: Secondary | ICD-10-CM | POA: Diagnosis not present

## 2023-03-23 DIAGNOSIS — S43005D Unspecified dislocation of left shoulder joint, subsequent encounter: Secondary | ICD-10-CM | POA: Diagnosis not present

## 2023-03-23 DIAGNOSIS — E039 Hypothyroidism, unspecified: Secondary | ICD-10-CM | POA: Diagnosis not present

## 2023-03-23 DIAGNOSIS — I10 Essential (primary) hypertension: Secondary | ICD-10-CM | POA: Diagnosis not present

## 2023-03-23 DIAGNOSIS — K219 Gastro-esophageal reflux disease without esophagitis: Secondary | ICD-10-CM | POA: Diagnosis not present

## 2023-03-23 DIAGNOSIS — E785 Hyperlipidemia, unspecified: Secondary | ICD-10-CM | POA: Diagnosis not present

## 2023-03-23 DIAGNOSIS — M1990 Unspecified osteoarthritis, unspecified site: Secondary | ICD-10-CM | POA: Diagnosis not present

## 2023-03-24 DIAGNOSIS — M25532 Pain in left wrist: Secondary | ICD-10-CM | POA: Diagnosis not present

## 2023-03-24 DIAGNOSIS — M25542 Pain in joints of left hand: Secondary | ICD-10-CM | POA: Diagnosis not present

## 2023-03-25 DIAGNOSIS — G5632 Lesion of radial nerve, left upper limb: Secondary | ICD-10-CM | POA: Diagnosis not present

## 2023-03-26 DIAGNOSIS — M25632 Stiffness of left wrist, not elsewhere classified: Secondary | ICD-10-CM | POA: Diagnosis not present

## 2023-03-26 DIAGNOSIS — M25642 Stiffness of left hand, not elsewhere classified: Secondary | ICD-10-CM | POA: Diagnosis not present

## 2023-03-26 DIAGNOSIS — M79642 Pain in left hand: Secondary | ICD-10-CM | POA: Diagnosis not present

## 2023-03-27 DIAGNOSIS — H409 Unspecified glaucoma: Secondary | ICD-10-CM | POA: Diagnosis not present

## 2023-03-27 DIAGNOSIS — M1990 Unspecified osteoarthritis, unspecified site: Secondary | ICD-10-CM | POA: Diagnosis not present

## 2023-03-27 DIAGNOSIS — E039 Hypothyroidism, unspecified: Secondary | ICD-10-CM | POA: Diagnosis not present

## 2023-03-27 DIAGNOSIS — I10 Essential (primary) hypertension: Secondary | ICD-10-CM | POA: Diagnosis not present

## 2023-03-27 DIAGNOSIS — K219 Gastro-esophageal reflux disease without esophagitis: Secondary | ICD-10-CM | POA: Diagnosis not present

## 2023-03-27 DIAGNOSIS — E785 Hyperlipidemia, unspecified: Secondary | ICD-10-CM | POA: Diagnosis not present

## 2023-03-27 DIAGNOSIS — S43005D Unspecified dislocation of left shoulder joint, subsequent encounter: Secondary | ICD-10-CM | POA: Diagnosis not present

## 2023-03-27 DIAGNOSIS — G5632 Lesion of radial nerve, left upper limb: Secondary | ICD-10-CM | POA: Diagnosis not present

## 2023-03-27 DIAGNOSIS — I4891 Unspecified atrial fibrillation: Secondary | ICD-10-CM | POA: Diagnosis not present

## 2023-03-31 DIAGNOSIS — G5632 Lesion of radial nerve, left upper limb: Secondary | ICD-10-CM | POA: Diagnosis not present

## 2023-03-31 DIAGNOSIS — M1990 Unspecified osteoarthritis, unspecified site: Secondary | ICD-10-CM | POA: Diagnosis not present

## 2023-03-31 DIAGNOSIS — E039 Hypothyroidism, unspecified: Secondary | ICD-10-CM | POA: Diagnosis not present

## 2023-03-31 DIAGNOSIS — I10 Essential (primary) hypertension: Secondary | ICD-10-CM | POA: Diagnosis not present

## 2023-03-31 DIAGNOSIS — K219 Gastro-esophageal reflux disease without esophagitis: Secondary | ICD-10-CM | POA: Diagnosis not present

## 2023-03-31 DIAGNOSIS — S43005D Unspecified dislocation of left shoulder joint, subsequent encounter: Secondary | ICD-10-CM | POA: Diagnosis not present

## 2023-03-31 DIAGNOSIS — I4891 Unspecified atrial fibrillation: Secondary | ICD-10-CM | POA: Diagnosis not present

## 2023-03-31 DIAGNOSIS — E785 Hyperlipidemia, unspecified: Secondary | ICD-10-CM | POA: Diagnosis not present

## 2023-03-31 DIAGNOSIS — H409 Unspecified glaucoma: Secondary | ICD-10-CM | POA: Diagnosis not present

## 2023-04-01 DIAGNOSIS — I10 Essential (primary) hypertension: Secondary | ICD-10-CM | POA: Diagnosis not present

## 2023-04-01 DIAGNOSIS — M1990 Unspecified osteoarthritis, unspecified site: Secondary | ICD-10-CM | POA: Diagnosis not present

## 2023-04-01 DIAGNOSIS — I4891 Unspecified atrial fibrillation: Secondary | ICD-10-CM | POA: Diagnosis not present

## 2023-04-01 DIAGNOSIS — S43005D Unspecified dislocation of left shoulder joint, subsequent encounter: Secondary | ICD-10-CM | POA: Diagnosis not present

## 2023-04-01 DIAGNOSIS — H409 Unspecified glaucoma: Secondary | ICD-10-CM | POA: Diagnosis not present

## 2023-04-01 DIAGNOSIS — G5632 Lesion of radial nerve, left upper limb: Secondary | ICD-10-CM | POA: Diagnosis not present

## 2023-04-01 DIAGNOSIS — K219 Gastro-esophageal reflux disease without esophagitis: Secondary | ICD-10-CM | POA: Diagnosis not present

## 2023-04-01 DIAGNOSIS — E039 Hypothyroidism, unspecified: Secondary | ICD-10-CM | POA: Diagnosis not present

## 2023-04-01 DIAGNOSIS — E785 Hyperlipidemia, unspecified: Secondary | ICD-10-CM | POA: Diagnosis not present

## 2023-04-02 DIAGNOSIS — I4891 Unspecified atrial fibrillation: Secondary | ICD-10-CM | POA: Diagnosis not present

## 2023-04-02 DIAGNOSIS — M79642 Pain in left hand: Secondary | ICD-10-CM | POA: Diagnosis not present

## 2023-04-02 DIAGNOSIS — G5632 Lesion of radial nerve, left upper limb: Secondary | ICD-10-CM | POA: Diagnosis not present

## 2023-04-02 DIAGNOSIS — M1990 Unspecified osteoarthritis, unspecified site: Secondary | ICD-10-CM | POA: Diagnosis not present

## 2023-04-02 DIAGNOSIS — E785 Hyperlipidemia, unspecified: Secondary | ICD-10-CM | POA: Diagnosis not present

## 2023-04-02 DIAGNOSIS — M25632 Stiffness of left wrist, not elsewhere classified: Secondary | ICD-10-CM | POA: Diagnosis not present

## 2023-04-02 DIAGNOSIS — H409 Unspecified glaucoma: Secondary | ICD-10-CM | POA: Diagnosis not present

## 2023-04-02 DIAGNOSIS — S43005D Unspecified dislocation of left shoulder joint, subsequent encounter: Secondary | ICD-10-CM | POA: Diagnosis not present

## 2023-04-02 DIAGNOSIS — K219 Gastro-esophageal reflux disease without esophagitis: Secondary | ICD-10-CM | POA: Diagnosis not present

## 2023-04-02 DIAGNOSIS — I10 Essential (primary) hypertension: Secondary | ICD-10-CM | POA: Diagnosis not present

## 2023-04-02 DIAGNOSIS — E039 Hypothyroidism, unspecified: Secondary | ICD-10-CM | POA: Diagnosis not present

## 2023-04-02 DIAGNOSIS — M25642 Stiffness of left hand, not elsewhere classified: Secondary | ICD-10-CM | POA: Diagnosis not present

## 2023-04-06 DIAGNOSIS — E785 Hyperlipidemia, unspecified: Secondary | ICD-10-CM | POA: Diagnosis not present

## 2023-04-06 DIAGNOSIS — S43005D Unspecified dislocation of left shoulder joint, subsequent encounter: Secondary | ICD-10-CM | POA: Diagnosis not present

## 2023-04-06 DIAGNOSIS — I4891 Unspecified atrial fibrillation: Secondary | ICD-10-CM | POA: Diagnosis not present

## 2023-04-06 DIAGNOSIS — K219 Gastro-esophageal reflux disease without esophagitis: Secondary | ICD-10-CM | POA: Diagnosis not present

## 2023-04-06 DIAGNOSIS — G5632 Lesion of radial nerve, left upper limb: Secondary | ICD-10-CM | POA: Diagnosis not present

## 2023-04-06 DIAGNOSIS — E039 Hypothyroidism, unspecified: Secondary | ICD-10-CM | POA: Diagnosis not present

## 2023-04-06 DIAGNOSIS — H409 Unspecified glaucoma: Secondary | ICD-10-CM | POA: Diagnosis not present

## 2023-04-06 DIAGNOSIS — M1990 Unspecified osteoarthritis, unspecified site: Secondary | ICD-10-CM | POA: Diagnosis not present

## 2023-04-06 DIAGNOSIS — I10 Essential (primary) hypertension: Secondary | ICD-10-CM | POA: Diagnosis not present

## 2023-04-08 DIAGNOSIS — M1990 Unspecified osteoarthritis, unspecified site: Secondary | ICD-10-CM | POA: Diagnosis not present

## 2023-04-08 DIAGNOSIS — I10 Essential (primary) hypertension: Secondary | ICD-10-CM | POA: Diagnosis not present

## 2023-04-08 DIAGNOSIS — G5632 Lesion of radial nerve, left upper limb: Secondary | ICD-10-CM | POA: Diagnosis not present

## 2023-04-08 DIAGNOSIS — S43005D Unspecified dislocation of left shoulder joint, subsequent encounter: Secondary | ICD-10-CM | POA: Diagnosis not present

## 2023-04-08 DIAGNOSIS — K219 Gastro-esophageal reflux disease without esophagitis: Secondary | ICD-10-CM | POA: Diagnosis not present

## 2023-04-08 DIAGNOSIS — E039 Hypothyroidism, unspecified: Secondary | ICD-10-CM | POA: Diagnosis not present

## 2023-04-08 DIAGNOSIS — H409 Unspecified glaucoma: Secondary | ICD-10-CM | POA: Diagnosis not present

## 2023-04-08 DIAGNOSIS — I4891 Unspecified atrial fibrillation: Secondary | ICD-10-CM | POA: Diagnosis not present

## 2023-04-08 DIAGNOSIS — E785 Hyperlipidemia, unspecified: Secondary | ICD-10-CM | POA: Diagnosis not present

## 2023-04-09 DIAGNOSIS — S43005D Unspecified dislocation of left shoulder joint, subsequent encounter: Secondary | ICD-10-CM | POA: Diagnosis not present

## 2023-04-09 DIAGNOSIS — I4891 Unspecified atrial fibrillation: Secondary | ICD-10-CM | POA: Diagnosis not present

## 2023-04-09 DIAGNOSIS — E785 Hyperlipidemia, unspecified: Secondary | ICD-10-CM | POA: Diagnosis not present

## 2023-04-09 DIAGNOSIS — M1990 Unspecified osteoarthritis, unspecified site: Secondary | ICD-10-CM | POA: Diagnosis not present

## 2023-04-09 DIAGNOSIS — E039 Hypothyroidism, unspecified: Secondary | ICD-10-CM | POA: Diagnosis not present

## 2023-04-09 DIAGNOSIS — I10 Essential (primary) hypertension: Secondary | ICD-10-CM | POA: Diagnosis not present

## 2023-04-09 DIAGNOSIS — H409 Unspecified glaucoma: Secondary | ICD-10-CM | POA: Diagnosis not present

## 2023-04-09 DIAGNOSIS — K219 Gastro-esophageal reflux disease without esophagitis: Secondary | ICD-10-CM | POA: Diagnosis not present

## 2023-04-09 DIAGNOSIS — G5632 Lesion of radial nerve, left upper limb: Secondary | ICD-10-CM | POA: Diagnosis not present

## 2023-04-13 DIAGNOSIS — E785 Hyperlipidemia, unspecified: Secondary | ICD-10-CM | POA: Diagnosis not present

## 2023-04-13 DIAGNOSIS — S43005D Unspecified dislocation of left shoulder joint, subsequent encounter: Secondary | ICD-10-CM | POA: Diagnosis not present

## 2023-04-13 DIAGNOSIS — G5632 Lesion of radial nerve, left upper limb: Secondary | ICD-10-CM | POA: Diagnosis not present

## 2023-04-13 DIAGNOSIS — I10 Essential (primary) hypertension: Secondary | ICD-10-CM | POA: Diagnosis not present

## 2023-04-13 DIAGNOSIS — E039 Hypothyroidism, unspecified: Secondary | ICD-10-CM | POA: Diagnosis not present

## 2023-04-13 DIAGNOSIS — K219 Gastro-esophageal reflux disease without esophagitis: Secondary | ICD-10-CM | POA: Diagnosis not present

## 2023-04-13 DIAGNOSIS — H409 Unspecified glaucoma: Secondary | ICD-10-CM | POA: Diagnosis not present

## 2023-04-13 DIAGNOSIS — M1990 Unspecified osteoarthritis, unspecified site: Secondary | ICD-10-CM | POA: Diagnosis not present

## 2023-04-13 DIAGNOSIS — I4891 Unspecified atrial fibrillation: Secondary | ICD-10-CM | POA: Diagnosis not present

## 2023-04-14 DIAGNOSIS — M25512 Pain in left shoulder: Secondary | ICD-10-CM | POA: Diagnosis not present

## 2023-04-15 DIAGNOSIS — E039 Hypothyroidism, unspecified: Secondary | ICD-10-CM | POA: Diagnosis not present

## 2023-04-15 DIAGNOSIS — H409 Unspecified glaucoma: Secondary | ICD-10-CM | POA: Diagnosis not present

## 2023-04-15 DIAGNOSIS — E785 Hyperlipidemia, unspecified: Secondary | ICD-10-CM | POA: Diagnosis not present

## 2023-04-15 DIAGNOSIS — K219 Gastro-esophageal reflux disease without esophagitis: Secondary | ICD-10-CM | POA: Diagnosis not present

## 2023-04-15 DIAGNOSIS — G5632 Lesion of radial nerve, left upper limb: Secondary | ICD-10-CM | POA: Diagnosis not present

## 2023-04-15 DIAGNOSIS — M1990 Unspecified osteoarthritis, unspecified site: Secondary | ICD-10-CM | POA: Diagnosis not present

## 2023-04-15 DIAGNOSIS — I4891 Unspecified atrial fibrillation: Secondary | ICD-10-CM | POA: Diagnosis not present

## 2023-04-15 DIAGNOSIS — S43005D Unspecified dislocation of left shoulder joint, subsequent encounter: Secondary | ICD-10-CM | POA: Diagnosis not present

## 2023-04-15 DIAGNOSIS — I10 Essential (primary) hypertension: Secondary | ICD-10-CM | POA: Diagnosis not present

## 2023-04-16 DIAGNOSIS — G5632 Lesion of radial nerve, left upper limb: Secondary | ICD-10-CM | POA: Diagnosis not present

## 2023-04-16 DIAGNOSIS — E039 Hypothyroidism, unspecified: Secondary | ICD-10-CM | POA: Diagnosis not present

## 2023-04-16 DIAGNOSIS — K219 Gastro-esophageal reflux disease without esophagitis: Secondary | ICD-10-CM | POA: Diagnosis not present

## 2023-04-16 DIAGNOSIS — I4891 Unspecified atrial fibrillation: Secondary | ICD-10-CM | POA: Diagnosis not present

## 2023-04-16 DIAGNOSIS — S43005D Unspecified dislocation of left shoulder joint, subsequent encounter: Secondary | ICD-10-CM | POA: Diagnosis not present

## 2023-04-16 DIAGNOSIS — E785 Hyperlipidemia, unspecified: Secondary | ICD-10-CM | POA: Diagnosis not present

## 2023-04-16 DIAGNOSIS — H409 Unspecified glaucoma: Secondary | ICD-10-CM | POA: Diagnosis not present

## 2023-04-16 DIAGNOSIS — M1990 Unspecified osteoarthritis, unspecified site: Secondary | ICD-10-CM | POA: Diagnosis not present

## 2023-04-16 DIAGNOSIS — I10 Essential (primary) hypertension: Secondary | ICD-10-CM | POA: Diagnosis not present

## 2023-04-20 DIAGNOSIS — E039 Hypothyroidism, unspecified: Secondary | ICD-10-CM | POA: Diagnosis not present

## 2023-04-20 DIAGNOSIS — I4891 Unspecified atrial fibrillation: Secondary | ICD-10-CM | POA: Diagnosis not present

## 2023-04-20 DIAGNOSIS — M1990 Unspecified osteoarthritis, unspecified site: Secondary | ICD-10-CM | POA: Diagnosis not present

## 2023-04-20 DIAGNOSIS — H409 Unspecified glaucoma: Secondary | ICD-10-CM | POA: Diagnosis not present

## 2023-04-20 DIAGNOSIS — S43005D Unspecified dislocation of left shoulder joint, subsequent encounter: Secondary | ICD-10-CM | POA: Diagnosis not present

## 2023-04-20 DIAGNOSIS — E785 Hyperlipidemia, unspecified: Secondary | ICD-10-CM | POA: Diagnosis not present

## 2023-04-20 DIAGNOSIS — I10 Essential (primary) hypertension: Secondary | ICD-10-CM | POA: Diagnosis not present

## 2023-04-20 DIAGNOSIS — K219 Gastro-esophageal reflux disease without esophagitis: Secondary | ICD-10-CM | POA: Diagnosis not present

## 2023-04-20 DIAGNOSIS — G5632 Lesion of radial nerve, left upper limb: Secondary | ICD-10-CM | POA: Diagnosis not present

## 2023-04-21 DIAGNOSIS — K219 Gastro-esophageal reflux disease without esophagitis: Secondary | ICD-10-CM | POA: Diagnosis not present

## 2023-04-21 DIAGNOSIS — E785 Hyperlipidemia, unspecified: Secondary | ICD-10-CM | POA: Diagnosis not present

## 2023-04-21 DIAGNOSIS — E039 Hypothyroidism, unspecified: Secondary | ICD-10-CM | POA: Diagnosis not present

## 2023-04-21 DIAGNOSIS — I4891 Unspecified atrial fibrillation: Secondary | ICD-10-CM | POA: Diagnosis not present

## 2023-04-21 DIAGNOSIS — I10 Essential (primary) hypertension: Secondary | ICD-10-CM | POA: Diagnosis not present

## 2023-04-21 DIAGNOSIS — M1990 Unspecified osteoarthritis, unspecified site: Secondary | ICD-10-CM | POA: Diagnosis not present

## 2023-04-21 DIAGNOSIS — G5632 Lesion of radial nerve, left upper limb: Secondary | ICD-10-CM | POA: Diagnosis not present

## 2023-04-21 DIAGNOSIS — S43005D Unspecified dislocation of left shoulder joint, subsequent encounter: Secondary | ICD-10-CM | POA: Diagnosis not present

## 2023-04-21 DIAGNOSIS — H409 Unspecified glaucoma: Secondary | ICD-10-CM | POA: Diagnosis not present

## 2023-04-24 DIAGNOSIS — K219 Gastro-esophageal reflux disease without esophagitis: Secondary | ICD-10-CM | POA: Diagnosis not present

## 2023-04-24 DIAGNOSIS — I10 Essential (primary) hypertension: Secondary | ICD-10-CM | POA: Diagnosis not present

## 2023-04-24 DIAGNOSIS — H409 Unspecified glaucoma: Secondary | ICD-10-CM | POA: Diagnosis not present

## 2023-04-24 DIAGNOSIS — G5632 Lesion of radial nerve, left upper limb: Secondary | ICD-10-CM | POA: Diagnosis not present

## 2023-04-24 DIAGNOSIS — I4891 Unspecified atrial fibrillation: Secondary | ICD-10-CM | POA: Diagnosis not present

## 2023-04-24 DIAGNOSIS — E039 Hypothyroidism, unspecified: Secondary | ICD-10-CM | POA: Diagnosis not present

## 2023-04-24 DIAGNOSIS — E785 Hyperlipidemia, unspecified: Secondary | ICD-10-CM | POA: Diagnosis not present

## 2023-04-24 DIAGNOSIS — M1990 Unspecified osteoarthritis, unspecified site: Secondary | ICD-10-CM | POA: Diagnosis not present

## 2023-04-24 DIAGNOSIS — S43005D Unspecified dislocation of left shoulder joint, subsequent encounter: Secondary | ICD-10-CM | POA: Diagnosis not present

## 2023-04-28 DIAGNOSIS — E785 Hyperlipidemia, unspecified: Secondary | ICD-10-CM | POA: Diagnosis not present

## 2023-04-28 DIAGNOSIS — I10 Essential (primary) hypertension: Secondary | ICD-10-CM | POA: Diagnosis not present

## 2023-04-28 DIAGNOSIS — I4891 Unspecified atrial fibrillation: Secondary | ICD-10-CM | POA: Diagnosis not present

## 2023-04-28 DIAGNOSIS — S43005D Unspecified dislocation of left shoulder joint, subsequent encounter: Secondary | ICD-10-CM | POA: Diagnosis not present

## 2023-04-28 DIAGNOSIS — E039 Hypothyroidism, unspecified: Secondary | ICD-10-CM | POA: Diagnosis not present

## 2023-04-28 DIAGNOSIS — M1990 Unspecified osteoarthritis, unspecified site: Secondary | ICD-10-CM | POA: Diagnosis not present

## 2023-04-28 DIAGNOSIS — K219 Gastro-esophageal reflux disease without esophagitis: Secondary | ICD-10-CM | POA: Diagnosis not present

## 2023-04-28 DIAGNOSIS — G5632 Lesion of radial nerve, left upper limb: Secondary | ICD-10-CM | POA: Diagnosis not present

## 2023-04-28 DIAGNOSIS — H409 Unspecified glaucoma: Secondary | ICD-10-CM | POA: Diagnosis not present

## 2023-04-29 DIAGNOSIS — M1990 Unspecified osteoarthritis, unspecified site: Secondary | ICD-10-CM | POA: Diagnosis not present

## 2023-04-29 DIAGNOSIS — H409 Unspecified glaucoma: Secondary | ICD-10-CM | POA: Diagnosis not present

## 2023-04-29 DIAGNOSIS — I4891 Unspecified atrial fibrillation: Secondary | ICD-10-CM | POA: Diagnosis not present

## 2023-04-29 DIAGNOSIS — K219 Gastro-esophageal reflux disease without esophagitis: Secondary | ICD-10-CM | POA: Diagnosis not present

## 2023-04-29 DIAGNOSIS — I10 Essential (primary) hypertension: Secondary | ICD-10-CM | POA: Diagnosis not present

## 2023-04-29 DIAGNOSIS — E039 Hypothyroidism, unspecified: Secondary | ICD-10-CM | POA: Diagnosis not present

## 2023-04-29 DIAGNOSIS — E785 Hyperlipidemia, unspecified: Secondary | ICD-10-CM | POA: Diagnosis not present

## 2023-04-29 DIAGNOSIS — G5632 Lesion of radial nerve, left upper limb: Secondary | ICD-10-CM | POA: Diagnosis not present

## 2023-04-29 DIAGNOSIS — S43005D Unspecified dislocation of left shoulder joint, subsequent encounter: Secondary | ICD-10-CM | POA: Diagnosis not present

## 2023-05-05 ENCOUNTER — Ambulatory Visit (INDEPENDENT_AMBULATORY_CARE_PROVIDER_SITE_OTHER): Payer: Medicare PPO

## 2023-05-05 DIAGNOSIS — I442 Atrioventricular block, complete: Secondary | ICD-10-CM | POA: Diagnosis not present

## 2023-05-06 DIAGNOSIS — I10 Essential (primary) hypertension: Secondary | ICD-10-CM | POA: Diagnosis not present

## 2023-05-06 DIAGNOSIS — I4891 Unspecified atrial fibrillation: Secondary | ICD-10-CM | POA: Diagnosis not present

## 2023-05-06 DIAGNOSIS — H409 Unspecified glaucoma: Secondary | ICD-10-CM | POA: Diagnosis not present

## 2023-05-06 DIAGNOSIS — S43005D Unspecified dislocation of left shoulder joint, subsequent encounter: Secondary | ICD-10-CM | POA: Diagnosis not present

## 2023-05-06 DIAGNOSIS — K219 Gastro-esophageal reflux disease without esophagitis: Secondary | ICD-10-CM | POA: Diagnosis not present

## 2023-05-06 DIAGNOSIS — E039 Hypothyroidism, unspecified: Secondary | ICD-10-CM | POA: Diagnosis not present

## 2023-05-06 DIAGNOSIS — G5632 Lesion of radial nerve, left upper limb: Secondary | ICD-10-CM | POA: Diagnosis not present

## 2023-05-06 DIAGNOSIS — E785 Hyperlipidemia, unspecified: Secondary | ICD-10-CM | POA: Diagnosis not present

## 2023-05-06 DIAGNOSIS — M1990 Unspecified osteoarthritis, unspecified site: Secondary | ICD-10-CM | POA: Diagnosis not present

## 2023-05-08 LAB — CUP PACEART REMOTE DEVICE CHECK
Battery Impedance: 1350 Ohm
Battery Remaining Longevity: 48 mo
Battery Voltage: 2.76 V
Brady Statistic AP VP Percent: 25 %
Brady Statistic AP VS Percent: 0 %
Brady Statistic AS VP Percent: 71 %
Brady Statistic AS VS Percent: 4 %
Date Time Interrogation Session: 20240516145203
Implantable Lead Connection Status: 753985
Implantable Lead Connection Status: 753985
Implantable Lead Implant Date: 20151111
Implantable Lead Implant Date: 20151111
Implantable Lead Location: 753859
Implantable Lead Location: 753860
Implantable Lead Model: 5076
Implantable Lead Model: 5076
Implantable Pulse Generator Implant Date: 20151111
Lead Channel Impedance Value: 527 Ohm
Lead Channel Impedance Value: 634 Ohm
Lead Channel Pacing Threshold Amplitude: 0.75 V
Lead Channel Pacing Threshold Amplitude: 1.5 V
Lead Channel Pacing Threshold Pulse Width: 0.4 ms
Lead Channel Pacing Threshold Pulse Width: 0.4 ms
Lead Channel Setting Pacing Amplitude: 1.5 V
Lead Channel Setting Pacing Amplitude: 2.5 V
Lead Channel Setting Pacing Pulse Width: 0.46 ms
Lead Channel Setting Sensing Sensitivity: 4 mV
Zone Setting Status: 755011
Zone Setting Status: 755011

## 2023-05-12 DIAGNOSIS — M25512 Pain in left shoulder: Secondary | ICD-10-CM | POA: Diagnosis not present

## 2023-05-14 DIAGNOSIS — S43005D Unspecified dislocation of left shoulder joint, subsequent encounter: Secondary | ICD-10-CM | POA: Diagnosis not present

## 2023-05-14 DIAGNOSIS — E039 Hypothyroidism, unspecified: Secondary | ICD-10-CM | POA: Diagnosis not present

## 2023-05-14 DIAGNOSIS — E785 Hyperlipidemia, unspecified: Secondary | ICD-10-CM | POA: Diagnosis not present

## 2023-05-14 DIAGNOSIS — M1990 Unspecified osteoarthritis, unspecified site: Secondary | ICD-10-CM | POA: Diagnosis not present

## 2023-05-14 DIAGNOSIS — I10 Essential (primary) hypertension: Secondary | ICD-10-CM | POA: Diagnosis not present

## 2023-05-14 DIAGNOSIS — K219 Gastro-esophageal reflux disease without esophagitis: Secondary | ICD-10-CM | POA: Diagnosis not present

## 2023-05-14 DIAGNOSIS — I4891 Unspecified atrial fibrillation: Secondary | ICD-10-CM | POA: Diagnosis not present

## 2023-05-14 DIAGNOSIS — G5632 Lesion of radial nerve, left upper limb: Secondary | ICD-10-CM | POA: Diagnosis not present

## 2023-05-14 DIAGNOSIS — H409 Unspecified glaucoma: Secondary | ICD-10-CM | POA: Diagnosis not present

## 2023-05-20 ENCOUNTER — Other Ambulatory Visit (HOSPITAL_COMMUNITY): Payer: Self-pay | Admitting: Orthopedic Surgery

## 2023-05-20 DIAGNOSIS — R2232 Localized swelling, mass and lump, left upper limb: Secondary | ICD-10-CM

## 2023-05-25 ENCOUNTER — Ambulatory Visit: Payer: Medicare PPO | Admitting: Cardiology

## 2023-06-02 NOTE — Progress Notes (Signed)
Remote pacemaker transmission.   

## 2023-06-09 ENCOUNTER — Ambulatory Visit (HOSPITAL_COMMUNITY)
Admission: RE | Admit: 2023-06-09 | Discharge: 2023-06-09 | Disposition: A | Discharge status: Home / Self Care | Payer: Medicare PPO | Source: Ambulatory Visit | Attending: Orthopedic Surgery | Admitting: Orthopedic Surgery

## 2023-06-09 DIAGNOSIS — S43005A Unspecified dislocation of left shoulder joint, initial encounter: Secondary | ICD-10-CM | POA: Diagnosis not present

## 2023-06-09 DIAGNOSIS — R2232 Localized swelling, mass and lump, left upper limb: Secondary | ICD-10-CM | POA: Diagnosis not present

## 2023-06-09 MED ORDER — SODIUM CHLORIDE (PF) 0.9 % IJ SOLN
INTRAMUSCULAR | Status: AC
  Filled 2023-06-09: qty 50

## 2023-06-09 MED ORDER — IOHEXOL 350 MG/ML SOLN
100.0000 mL | Freq: Once | INTRAVENOUS | Status: AC | PRN
  Administered 2023-06-09: 100 mL via INTRAVENOUS

## 2023-06-11 DIAGNOSIS — M25512 Pain in left shoulder: Secondary | ICD-10-CM | POA: Diagnosis not present

## 2023-06-23 DIAGNOSIS — E7800 Pure hypercholesterolemia, unspecified: Secondary | ICD-10-CM | POA: Diagnosis not present

## 2023-06-23 DIAGNOSIS — I10 Essential (primary) hypertension: Secondary | ICD-10-CM | POA: Diagnosis not present

## 2023-06-23 DIAGNOSIS — I2510 Atherosclerotic heart disease of native coronary artery without angina pectoris: Secondary | ICD-10-CM | POA: Diagnosis not present

## 2023-06-23 DIAGNOSIS — I480 Paroxysmal atrial fibrillation: Secondary | ICD-10-CM | POA: Diagnosis not present

## 2023-06-23 LAB — LIPID PANEL
Chol/HDL Ratio: 2.4 ratio (ref 0.0–4.4)
Cholesterol, Total: 152 mg/dL (ref 100–199)
HDL: 63 mg/dL (ref >39)
LDL Chol Calc (NIH): 77 mg/dL (ref 0–99)
Triglycerides: 60 mg/dL (ref 0–149)
VLDL Cholesterol Cal: 12 mg/dL (ref 5–40)

## 2023-06-24 ENCOUNTER — Telehealth: Payer: Self-pay

## 2023-06-24 NOTE — Telephone Encounter (Addendum)
Called patient regarding results. Patient had understanding of results.----- Message from Cannon Kettle, PA-C sent at 06/24/2023 12:23 PM EDT ----- LDL 143 in 08/2022.   LDL improved to 77 on Crestor. Triglycerides have also improved significantly from 1 year ago.  Continue current medications.

## 2023-06-26 NOTE — Progress Notes (Unsigned)
Cardiology Office Note  Date:  06/30/2023   ID:  KIZMET ENDLICH, DOB 1941-01-31, MRN [id number]  PCP:  Lauren Garbe, Henry  Cardiologist:   Lauren Strubel [place], Henry   Chief Complaint  Patient presents with   Atrial Fibrillation      History of Present Illness: Lauren Henry is a 82 y.o. female  seen to establish follow up cardiac care. Previously seen by Dr Lauren Henry. She has  complete heart block s/p PPM, paroxysmal atrial fibrillation, mild-moderate CAD, and hypertension.  [name] had a pacemaker implanted by Dr. Ladona Henry in 2015 due to complete heart block.  She was minimally symptomatic and reports that it was incidentally found when she presented for knee surgery.  She has difficult to manage hypertension.  She had  an echo 09/13/18 that revealed LVEF 45-50% with diffuse hypokinesis and severe mitral annular calcification.  A Lexiscan Myoview 10/07/18 that revealed a reversible defect in the inferior, apical and inferolateral regions.  This was associated with apical akinesis and inferoapical dyskinesis.  This was thought to be either ischemia or a pacemaker artifact.  Given that she was feeling well and has chronic kidney disease she elected medical management.  Clonidine was increased due to poorly controlled blood pressure.  She was admitted with hypertensive urgency.  Her blood pressure was as high as the 240s at home.  Troponin was mildly elevated and she underwent cardiac catheterization 10/2018 that revealed mild to moderate non-obstructive CAD.  There was some concern about the proximal LAD.  However FFR was unremarkable.  In February 2023, patient was admitted for postural dizziness with presyncope.  This was felt to be due to orthostatic hypotension in the setting of multiple antihypertensives and a decreased oral intake.  If she was treated for UTI at that time.  Since then, she had no further episode of syncope.  Her PCP placed her on a short course of prednisone in effort to stimulate  her appetite.  Follow-up lab work shows significantly elevated FTs, therefore patient was readmitted to the hospital on 03/07/2022.  Since December 2022, she lost roughly 30 pounds due to lack of appetite.  Cardiology service was consulted during the hospitalization due to elevated troponin and LBBB.  She denied any chest pain or worsening dyspnea.  She was seen by Dr. Excell Seltzer.  Echocardiogram in March demonstrated EF 55 to 60%, no regional wall motion abnormality, moderate LVH, mild to moderate LAE, no significant valve issue.  Given the reassuring echocardiogram and the mildly elevated troponin in the setting of her CKD, it was unlikely that cardiac pathology was contributing to her failure to thrive.  During the hospitalization, patient also had parkinsonian features and resting tremors, MRI was contraindicated due to pacemaker.  Neurology recommended CT myelogram at some point as outpatient.  On subsequent outpatient visit her LFTs normalized.   She was readmitted 3/23-4/10/23 with acute angioedema with swelling involving her face, eye, neck and right arm. Required ventilator support. Treated with steroids and Pepcid with improvement. Cause of her angioedema was never determined.   She fell in March and dislocated her left shoulder. Seeing Ortho and getting PT. She was started on Crestor earlier this year with reduction in LDL from 143 to 77. She is tolerating well. She denies any palpitations, dizziness. No SOB. BP at home has been excellent 105-133/68-80.   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, developed CHB 10/2014 s/p Medtr[employer] PPM; atrial fib (PAF)   History of blood transfusion as a child   no abnormal reaction noted   History of colon polyps    Hyperlipidemia    takes Pravastatin daily   Hypertension    takes Azor and Metoprolol daily   Hypothyroidism    takes Synthroid  daily   Joint pain    Nausea vomiting and diarrhea 08/23/2020   Presence of permanent cardiac pacemaker    Urinary frequency    Urinary urgency     Past Surgical History:  Procedure Laterality Date   ABDOMINAL HYSTERECTOMY     cataract surgery Bilateral    CHOLECYSTECTOMY  1971   COLONOSCOPY     EYE SURGERY     INSERT / REPLACE / REMOVE PACEMAKER     Medtronic PPM 11/01/14 (Dr. Lewayne Bunting)   JOINT REPLACEMENT Right    knee   LEFT HEART CATH AND CORONARY ANGIOGRAPHY N/A 11/01/2018   Procedure: LEFT HEART CATH AND CORONARY ANGIOGRAPHY;  Surgeon: Lauren Henry;  Location: [facility] INVASIVE CV LAB;  Service: Cardiovascular;  Laterality: N/A;   PERMANENT PACEMAKER INSERTION N/A 11/01/2014   Procedure: PERMANENT PACEMAKER INSERTION;  Surgeon: Lauren Henry;  Location: [facility] CATH LAB;  Service: Cardiovascular;  Laterality: N/A;   stomach stapled  1985   TOTAL KNEE ARTHROPLASTY Left 11/05/2015   Procedure: TOTAL KNEE ARTHROPLASTY;  Surgeon: Lauren Henry;  Location: [facility] OR;  Service: Orthopedics;  Laterality: Left;     Current Outpatient Medications  Medication Sig Dispense Refill   acetaminophen (TYLENOL) 500 MG tablet Take 1 tablet (500 mg total) by mouth every 6 (six) hours as needed. 30 tablet 0   apixaban (ELIQUIS) 2.5 MG TABS tablet Take 1 tablet (2.5 mg total) by mouth 2 (two) times daily. 180 tablet 3   Cholecalciferol (VITAMIN D3) 50 MCG (2000 UT) TABS Take 2,000 Units by mouth every morning.     Cyanocobalamin (VITAMIN B12) 1000 MCG TBCR 1/2 tablet Orally Once a day     dorzolamide (TRUSOPT) 2 % ophthalmic solution Place 1 drop into the left eye 2 (two) times daily.     ferrous sulfate 325 (65 FE) MG tablet Take 325 mg by mouth daily with breakfast.     levothyroxine (SYNTHROID, LEVOTHROID) 150 MCG tablet Take 150-225 mcg by mouth See admin instructions. Take one tablet (150 mcg) by mouth daily before breakfast, on 2 days of the week take an additional 1/2 tablet (75 mg)  for a total dose on those days of 225 mcg  0   metoprolol tartrate (LOPRESSOR) 25 MG tablet Take 0.5 tablets (12.5 mg total) by mouth 2 (two) times daily. 60 tablet 3   Multiple Vitamin (MULTIVITAMIN WITH MINERALS) TABS tablet Take 1 tablet by mouth every morning.     pantoprazole (PROTONIX) 40 MG tablet Take 40 mg by mouth every morning.  3   rosuvastatin (CRESTOR) 40 MG tablet Take 1 tablet (40 mg total) by mouth daily. 90 tablet 3   vitamin C (ASCORBIC ACID) 250 MG tablet Take 250 mg by mouth every morning.     No current facility-administered medications for this visit.    Allergies:   Clindamycin/lincomycin, Methimazole, Olmesartan, and Amlodipine    Social History:  The patient  reports that she has never smoked. She has never used smokeless tobacco. She reports current alcohol use of about 1.0 standard drink of alcohol  per week. She reports that she does not use drugs.   Family History:  The patient's family history includes Heart attack in her father; Heart disease in her father; Stroke in her sister.    ROS:  Please see the history of present illness.   Otherwise, review of systems are positive for none.   All other systems are reviewed and negative.    PHYSICAL EXAM: VS:  BP (!) 150/90   Pulse 79   Ht 5\' 5"  (1.651 m)   Wt 175 lb 3.2 oz (79.5 kg)   SpO2 98%   BMI 29.15 kg/m  , BMI Body mass index is 29.15 kg/m. GENERAL:  Well appearing, overweight WF in NAD HEENT: Pupils equal round and reactive, fundi not visualized, oral mucosa unremarkable NECK:  No jugular venous distention, waveform within normal limits, carotid upstroke brisk and symmetric, no bruits LUNGS:  Clear to auscultation bilaterally HEART:  RRR.  PMI not displaced or sustained,S1 and S2 within normal limits, no S3, no S4, no clicks, no rubs, II/VI systolic murmur at LUSB ABD:  Flat, positive bowel sounds normal in frequency in pitch, no bruits, no rebound, no guarding, no midline pulsatile mass, no  hepatomegaly, no splenomegaly EXT:  2 plus pulses throughout, no edema, no cyanosis no clubbing SKIN:  No rashes no nodules NEURO:  Cranial nerves II through XII grossly intact, motor grossly intact throughout PSYCH:  Cognitively intact, oriented to person place and time   EKG:  EKG is not ordered today.    Echo 10/30/14: Study Conclusions  - Left ventricle: The cavity size was normal. There was mild focal   basal hypertrophy of the septum. Systolic function was normal.   The estimated ejection fraction was in the range of 60% to 65%.   Wall motion was normal; there were no regional wall motion   abnormalities. - Aortic valve: Trileaflet; mildly thickened, mildly calcified   leaflets. Transvalvular velocity was minimally increased. There   was no stenosis. - Mitral valve: Calcified annulus. There was mild regurgitation. - Right ventricle: The cavity size was mildly dilated. Wall   thickness was normal.   Echo 09/13/18: Study Conclusions   - Left ventricle: The cavity size was normal. There was mild focal   basal hypertrophy of the septum. Systolic function was mildly   reduced. The estimated ejection fraction was in the range of 45%   to 50%. Diffuse hypokinesis. Doppler parameters are consistent   with both elevated ventricular end-diastolic filling pressure and   elevated left atrial filling pressure. - Mitral valve: Severely calcified annulus. Mildly thickened,   mildly calcified leaflets . - Left atrium: The atrium was mildly dilated. - Atrial septum: No defect or patent foramen ovale was identified.   Lexiscan Myoview 10/07/18:   The left ventricular ejection fraction is mildly decreased (45-54%). Nuclear stress EF: 54%. No T wave inversion was noted during stress. There was no ST segment deviation noted during stress. Defect 1: There is a large defect of moderate severity.   Large size, moderate severity partially reversible inferior, apical and inferolateral  perfusion defect, suggestive of ischemia. This could also represent pacemaker-related artifact. There is apical akinesis and inferoapical dyskinesis. LVEF 54%. This is an intermediate risk study. Clinical correlation is advised.   LHC 10/2018: Conclusions: Mild to moderate, non-obstructive CAD.  Most severe lesion is a 50-60% stenosis in the proximal LCx that is not hemodynamically significant (DFR 1.0).  I suspect mild troponin elevation reflects supply-demand mismatch in the setting  of uncontrolled hypertension and renal insufficiency. Normal left ventricular filling pressure.   Recommendations: Medical therapy and risk factor modification to prevent progression of disease. Consider noninvasive evaluation for renal artery stenosis (if not already performed as an outpatient), given hypertension and renal insufficiency.   Echo 03/14/22: IMPRESSIONS     1. Left ventricular ejection fraction, by estimation, is 55 to 60%. The  left ventricle has normal function. The left ventricle has no regional  wall motion abnormalities. There is moderate concentric left ventricular  hypertrophy. Left ventricular  diastolic parameters are consistent with Grade I diastolic dysfunction  (impaired relaxation).   2. Right ventricular systolic function is normal. The right ventricular  size is normal. There is normal pulmonary artery systolic pressure.   3. Left atrial size was mild to moderately dilated.   4. The mitral valve is abnormal. No evidence of mitral valve  regurgitation.   5. The aortic valve is calcified. Aortic valve regurgitation is not  visualized. Mild aortic valve stenosis.   Recent Labs: No results found for requested labs within last 365 days.    Lipid Panel    Component Value Date/Time   CHOL 152 06/23/2023 1058   TRIG 60 06/23/2023 1058   HDL 63 06/23/2023 1058   CHOLHDL 2.4 06/23/2023 1058   CHOLHDL 2.3 10/30/2018 1852   VLDL 10 10/30/2018 1852   LDLCALC 77 06/23/2023 1058    07/22/2019: Total cholesterol 127, triglycerides 80, HDL 42, LDL 69 Dated 08/28/21: Hgb 11.2. creatinine 1.5. BUN 30, otherwise CBC and CMET normal. Cholesterol 129, triglycerides 64, HDL 41, LDL 75. TSH normal.  Wt Readings from Last 3 Encounters:  06/30/23 175 lb 3.2 oz (79.5 kg)  02/20/23 174 lb (78.9 kg)  12/03/22 175 lb 6.4 oz (79.6 kg)      ASSESSMENT AND PLAN:  PAF:   Well controlled. Continue metoprolol  and Eliquis. On reduced Eliquis due to age and fluctuating renal function Burden 0.5% by pacemaker follow up.   2.  Labile Hypertension:   Blood pressure well controlled per patient checks   3. Non-obstructive CAD: Left heart cath 10/2018 showed nonobstructive disease.  LDL was 72 on 08/2020.  Continue rosuvastatin.  She is not on aspirin given that she is on Eliquis.  4. Hyperlipidemia- good response to Crestor- continue   5. CHB s/p PPM:   Managed by Dr. Ladona Henry.     Current medicines are reviewed at length with the patient today.  The patient does not have concerns regarding medicines.  The following changes have been made: none  Labs/ tests ordered today include:   No orders of the defined types were placed in this encounter.     Disposition:  follow up in 6 months    Signed, Garret Teale [place] Henry, [facility]  06/30/2023 2:53 PM    [facility]

## 2023-06-30 ENCOUNTER — Ambulatory Visit: Payer: Medicare PPO | Attending: Cardiology | Admitting: Cardiology

## 2023-06-30 ENCOUNTER — Encounter: Payer: Self-pay | Admitting: Cardiology

## 2023-06-30 VITALS — BP 150/90 | HR 79 | Ht 65.0 in | Wt 175.2 lb

## 2023-06-30 DIAGNOSIS — I442 Atrioventricular block, complete: Secondary | ICD-10-CM

## 2023-06-30 DIAGNOSIS — I480 Paroxysmal atrial fibrillation: Secondary | ICD-10-CM | POA: Diagnosis not present

## 2023-06-30 DIAGNOSIS — I2510 Atherosclerotic heart disease of native coronary artery without angina pectoris: Secondary | ICD-10-CM | POA: Diagnosis not present

## 2023-06-30 DIAGNOSIS — E7800 Pure hypercholesterolemia, unspecified: Secondary | ICD-10-CM

## 2023-06-30 NOTE — Patient Instructions (Signed)
Medication Instructions:  Continue same medications *If you need a refill on your cardiac medications before your next appointment, please call your pharmacy*   Lab Work: None ordered   Testing/Procedures: None ordered   Follow-Up: At [facility], you and your health needs are our priority.  As part of our continuing mission to provide you with exceptional heart care, we have created designated Provider Care Teams.  These Care Teams include your primary Cardiologist (physician) and Advanced Practice Providers (APPs -  Physician Assistants and Nurse Practitioners) who all work together to provide you with the care you need, when you need it.  We recommend signing up for the patient portal called "MyChart".  Sign up information is provided on this After Visit Summary.  MyChart is used to connect with patients for Virtual Visits (Telemedicine).  Patients are able to view lab/test results, encounter notes, upcoming appointments, etc.  Non-urgent messages can be sent to your provider as well.   To learn more about what you can do with MyChart, go to [email or web address].    Your next appointment:  6 months    Call in Sept to schedule Jan appointment     Provider:  Dr.Jordan   

## 2023-07-01 DIAGNOSIS — M25512 Pain in left shoulder: Secondary | ICD-10-CM | POA: Diagnosis not present

## 2023-07-08 DIAGNOSIS — M25512 Pain in left shoulder: Secondary | ICD-10-CM | POA: Diagnosis not present

## 2023-07-08 DIAGNOSIS — H34832 Tributary (branch) retinal vein occlusion, left eye, with macular edema: Secondary | ICD-10-CM | POA: Diagnosis not present

## 2023-07-08 DIAGNOSIS — H353132 Nonexudative age-related macular degeneration, bilateral, intermediate dry stage: Secondary | ICD-10-CM | POA: Diagnosis not present

## 2023-07-08 DIAGNOSIS — H40052 Ocular hypertension, left eye: Secondary | ICD-10-CM | POA: Diagnosis not present

## 2023-07-08 DIAGNOSIS — H35033 Hypertensive retinopathy, bilateral: Secondary | ICD-10-CM | POA: Diagnosis not present

## 2023-07-10 DIAGNOSIS — M25512 Pain in left shoulder: Secondary | ICD-10-CM | POA: Diagnosis not present

## 2023-07-13 DIAGNOSIS — M25512 Pain in left shoulder: Secondary | ICD-10-CM | POA: Diagnosis not present

## 2023-07-17 DIAGNOSIS — M25512 Pain in left shoulder: Secondary | ICD-10-CM | POA: Diagnosis not present

## 2023-07-21 DIAGNOSIS — M25512 Pain in left shoulder: Secondary | ICD-10-CM | POA: Diagnosis not present

## 2023-07-23 DIAGNOSIS — M25512 Pain in left shoulder: Secondary | ICD-10-CM | POA: Diagnosis not present

## 2023-07-24 DIAGNOSIS — M25512 Pain in left shoulder: Secondary | ICD-10-CM | POA: Diagnosis not present

## 2023-07-28 DIAGNOSIS — M25512 Pain in left shoulder: Secondary | ICD-10-CM | POA: Diagnosis not present

## 2023-08-03 DIAGNOSIS — M25512 Pain in left shoulder: Secondary | ICD-10-CM | POA: Diagnosis not present

## 2023-08-04 ENCOUNTER — Ambulatory Visit (INDEPENDENT_AMBULATORY_CARE_PROVIDER_SITE_OTHER): Payer: Medicare PPO

## 2023-08-04 ENCOUNTER — Telehealth: Payer: Self-pay

## 2023-08-04 DIAGNOSIS — I442 Atrioventricular block, complete: Secondary | ICD-10-CM

## 2023-08-04 NOTE — Telephone Encounter (Signed)
Pt called in stating that her handheld is not working so we have sent out a new one and it will take 7-10 business days pt is aware and wanted to let us know that why her transmission will not go through today

## 2023-08-10 DIAGNOSIS — M25512 Pain in left shoulder: Secondary | ICD-10-CM | POA: Diagnosis not present

## 2023-08-10 LAB — CUP PACEART REMOTE DEVICE CHECK
Battery Impedance: 1490 Ohm
Battery Remaining Longevity: 45 mo
Battery Voltage: 2.76 V
Brady Statistic AP VP Percent: 26 %
Brady Statistic AP VS Percent: 0 %
Brady Statistic AS VP Percent: 70 %
Brady Statistic AS VS Percent: 3 %
Date Time Interrogation Session: 20240819084321
Implantable Lead Connection Status: 753985
Implantable Lead Connection Status: 753985
Implantable Lead Implant Date: 20151111
Implantable Lead Implant Date: 20151111
Implantable Lead Location: 753859
Implantable Lead Location: 753860
Implantable Lead Model: 5076
Implantable Lead Model: 5076
Implantable Pulse Generator Implant Date: 20151111
Lead Channel Impedance Value: 519 Ohm
Lead Channel Impedance Value: 684 Ohm
Lead Channel Pacing Threshold Amplitude: 0.625 V
Lead Channel Pacing Threshold Amplitude: 1.5 V
Lead Channel Pacing Threshold Pulse Width: 0.4 ms
Lead Channel Pacing Threshold Pulse Width: 0.4 ms
Lead Channel Setting Pacing Amplitude: 1.5 V
Lead Channel Setting Pacing Amplitude: 2.5 V
Lead Channel Setting Pacing Pulse Width: 0.46 ms
Lead Channel Setting Sensing Sensitivity: 4 mV
Zone Setting Status: 755011
Zone Setting Status: 755011

## 2023-08-14 DIAGNOSIS — M25512 Pain in left shoulder: Secondary | ICD-10-CM | POA: Diagnosis not present

## 2023-08-19 DIAGNOSIS — M25512 Pain in left shoulder: Secondary | ICD-10-CM | POA: Diagnosis not present

## 2023-08-19 NOTE — Progress Notes (Signed)
Remote pacemaker transmission.   

## 2023-08-25 DIAGNOSIS — M25512 Pain in left shoulder: Secondary | ICD-10-CM | POA: Diagnosis not present

## 2023-08-28 DIAGNOSIS — M25512 Pain in left shoulder: Secondary | ICD-10-CM | POA: Diagnosis not present

## 2023-09-01 DIAGNOSIS — M25512 Pain in left shoulder: Secondary | ICD-10-CM | POA: Diagnosis not present

## 2023-09-04 DIAGNOSIS — M25512 Pain in left shoulder: Secondary | ICD-10-CM | POA: Diagnosis not present

## 2023-09-07 DIAGNOSIS — N184 Chronic kidney disease, stage 4 (severe): Secondary | ICD-10-CM | POA: Diagnosis not present

## 2023-09-07 DIAGNOSIS — E875 Hyperkalemia: Secondary | ICD-10-CM | POA: Diagnosis not present

## 2023-09-07 DIAGNOSIS — E039 Hypothyroidism, unspecified: Secondary | ICD-10-CM | POA: Diagnosis not present

## 2023-09-07 DIAGNOSIS — E7800 Pure hypercholesterolemia, unspecified: Secondary | ICD-10-CM | POA: Diagnosis not present

## 2023-09-07 DIAGNOSIS — D631 Anemia in chronic kidney disease: Secondary | ICD-10-CM | POA: Diagnosis not present

## 2023-09-07 DIAGNOSIS — I1310 Hypertensive heart and chronic kidney disease without heart failure, with stage 1 through stage 4 chronic kidney disease, or unspecified chronic kidney disease: Secondary | ICD-10-CM | POA: Diagnosis not present

## 2023-09-07 DIAGNOSIS — E559 Vitamin D deficiency, unspecified: Secondary | ICD-10-CM | POA: Diagnosis not present

## 2023-09-07 DIAGNOSIS — E785 Hyperlipidemia, unspecified: Secondary | ICD-10-CM | POA: Diagnosis not present

## 2023-09-08 DIAGNOSIS — M25512 Pain in left shoulder: Secondary | ICD-10-CM | POA: Diagnosis not present

## 2023-09-14 DIAGNOSIS — N184 Chronic kidney disease, stage 4 (severe): Secondary | ICD-10-CM | POA: Diagnosis not present

## 2023-09-14 DIAGNOSIS — E7800 Pure hypercholesterolemia, unspecified: Secondary | ICD-10-CM | POA: Diagnosis not present

## 2023-09-14 DIAGNOSIS — I130 Hypertensive heart and chronic kidney disease with heart failure and stage 1 through stage 4 chronic kidney disease, or unspecified chronic kidney disease: Secondary | ICD-10-CM | POA: Diagnosis not present

## 2023-09-14 DIAGNOSIS — I480 Paroxysmal atrial fibrillation: Secondary | ICD-10-CM | POA: Diagnosis not present

## 2023-09-14 DIAGNOSIS — G20A1 Parkinson's disease without dyskinesia, without mention of fluctuations: Secondary | ICD-10-CM | POA: Diagnosis not present

## 2023-09-14 DIAGNOSIS — Z7901 Long term (current) use of anticoagulants: Secondary | ICD-10-CM | POA: Diagnosis not present

## 2023-09-14 DIAGNOSIS — D6869 Other thrombophilia: Secondary | ICD-10-CM | POA: Diagnosis not present

## 2023-09-14 DIAGNOSIS — I5032 Chronic diastolic (congestive) heart failure: Secondary | ICD-10-CM | POA: Diagnosis not present

## 2023-09-14 DIAGNOSIS — Z1339 Encounter for screening examination for other mental health and behavioral disorders: Secondary | ICD-10-CM | POA: Diagnosis not present

## 2023-09-14 DIAGNOSIS — Z23 Encounter for immunization: Secondary | ICD-10-CM | POA: Diagnosis not present

## 2023-09-14 DIAGNOSIS — Z1331 Encounter for screening for depression: Secondary | ICD-10-CM | POA: Diagnosis not present

## 2023-09-14 DIAGNOSIS — Z0000 Encounter for general adult medical examination without abnormal findings: Secondary | ICD-10-CM | POA: Diagnosis not present

## 2023-09-14 DIAGNOSIS — Z950 Presence of cardiac pacemaker: Secondary | ICD-10-CM | POA: Diagnosis not present

## 2023-09-14 DIAGNOSIS — Z96652 Presence of left artificial knee joint: Secondary | ICD-10-CM | POA: Diagnosis not present

## 2023-09-14 DIAGNOSIS — E669 Obesity, unspecified: Secondary | ICD-10-CM | POA: Diagnosis not present

## 2023-09-22 DIAGNOSIS — M25512 Pain in left shoulder: Secondary | ICD-10-CM | POA: Diagnosis not present

## 2023-10-07 DIAGNOSIS — K439 Ventral hernia without obstruction or gangrene: Secondary | ICD-10-CM | POA: Diagnosis not present

## 2023-10-07 DIAGNOSIS — Z0001 Encounter for general adult medical examination with abnormal findings: Secondary | ICD-10-CM | POA: Diagnosis not present

## 2023-11-03 ENCOUNTER — Ambulatory Visit (INDEPENDENT_AMBULATORY_CARE_PROVIDER_SITE_OTHER): Payer: Medicare PPO

## 2023-11-03 DIAGNOSIS — I442 Atrioventricular block, complete: Secondary | ICD-10-CM

## 2023-11-05 LAB — CUP PACEART REMOTE DEVICE CHECK
Battery Impedance: 1574 Ohm
Battery Remaining Longevity: 42 mo
Battery Voltage: 2.75 V
Brady Statistic AP VP Percent: 27 %
Brady Statistic AP VS Percent: 0 %
Brady Statistic AS VP Percent: 70 %
Brady Statistic AS VS Percent: 3 %
Date Time Interrogation Session: 20241112082051
Implantable Lead Connection Status: 753985
Implantable Lead Connection Status: 753985
Implantable Lead Implant Date: 20151111
Implantable Lead Implant Date: 20151111
Implantable Lead Location: 753859
Implantable Lead Location: 753860
Implantable Lead Model: 5076
Implantable Lead Model: 5076
Implantable Pulse Generator Implant Date: 20151111
Lead Channel Impedance Value: 504 Ohm
Lead Channel Impedance Value: 639 Ohm
Lead Channel Pacing Threshold Amplitude: 0.625 V
Lead Channel Pacing Threshold Amplitude: 1.375 V
Lead Channel Pacing Threshold Pulse Width: 0.4 ms
Lead Channel Pacing Threshold Pulse Width: 0.4 ms
Lead Channel Setting Pacing Amplitude: 1.5 V
Lead Channel Setting Pacing Amplitude: 2.5 V
Lead Channel Setting Pacing Pulse Width: 0.46 ms
Lead Channel Setting Sensing Sensitivity: 4 mV
Zone Setting Status: 755011
Zone Setting Status: 755011

## 2023-11-08 ENCOUNTER — Encounter (HOSPITAL_COMMUNITY): Payer: Self-pay | Admitting: Emergency Medicine

## 2023-11-08 ENCOUNTER — Ambulatory Visit (HOSPITAL_COMMUNITY)
Admission: EM | Admit: 2023-11-08 | Discharge: 2023-11-08 | Disposition: A | Discharge status: Home / Self Care | Payer: Medicare PPO | Attending: Family Medicine | Admitting: Family Medicine

## 2023-11-08 DIAGNOSIS — L239 Allergic contact dermatitis, unspecified cause: Secondary | ICD-10-CM | POA: Diagnosis not present

## 2023-11-08 MED ORDER — METHYLPREDNISOLONE ACETATE 80 MG/ML IJ SUSP
80.0000 mg | Freq: Once | INTRAMUSCULAR | Status: AC
  Administered 2023-11-08: 80 mg via INTRAMUSCULAR

## 2023-11-08 MED ORDER — PREDNISONE 20 MG PO TABS: 40.0000 mg | ORAL_TABLET | Freq: Every day | ORAL | 0 refills | Status: AC

## 2023-11-08 MED ORDER — METHYLPREDNISOLONE ACETATE 80 MG/ML IJ SUSP
INTRAMUSCULAR | Status: AC
  Filled 2023-11-08: qty 1

## 2023-11-08 NOTE — Discharge Instructions (Addendum)
You have been given an injection of methylprednisolone 80 mg, steroid.  Take prednisone 20 mg--2 daily for 5 days  The above medications are to help with allergic reaction.  You can also take Zyrtec/cetirizine, fexofenadine/Allegra, or Claritin/loratadine as needed for itching.  These antihistamines are less likely to make you sleepy or dizzy.

## 2023-11-08 NOTE — ED Triage Notes (Signed)
Pt c/o rash on back and chest that is very itchy. States she thinks she got bite on right side of back last Wednesday after mowing and it started to spread yesterday.

## 2023-11-08 NOTE — ED Provider Notes (Signed)
[facility]    CSN: [id number] Arrival date & time: 11/08/23  1046      History   Chief Complaint Chief Complaint  Patient presents with   Rash    HPI Lauren Henry is a 82 y.o. female.    Rash Here for rash and itching.  About 10 days ago she had mowed and after that noted some splotchy red area on her upper back.  It itch some and cortisone 10 cream seem to help a little.  It has been getting better but then this morning she awoke with rash and itching on her upper anterior and posterior chest.  She actually has rash down to her waist on her back.  No trouble breathing and no fever.    Allergies include clindamycin, olmesartan, methimazole, and amlodipine.  Past medical history is negative for diabetes.  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; atrial fib (PAF)   History of blood transfusion as a child   no abnormal reaction noted   History of colon polyps    Hyperlipidemia    takes Pravastatin daily   Hypertension    takes Azor and Metoprolol daily   Hypothyroidism    takes Synthroid daily   Joint pain    Nausea vomiting and diarrhea 08/23/2020   Presence of permanent cardiac pacemaker    Urinary frequency    Urinary urgency     Patient Active Problem List   Diagnosis Date Noted   Angio-edema    Acute kidney injury superimposed on chronic kidney disease (HCC) 03/13/2022   Neck and right arm swelling 03/13/2022   Rash 03/13/2022   Dysphagia 03/13/2022   Swelling of right upper extremity 03/13/2022   Eye swelling, left 03/13/2022   Chronic diastolic CHF (congestive heart failure) (HCC) 03/13/2022   Protein-calorie malnutrition, severe 03/09/2022   Failure to thrive in adult 03/07/2022   Parkinsonian features 03/07/2022   Microscopic hematuria 03/07/2022   Stage 3b chronic kidney disease  (CKD) (HCC) 02/21/2022   Syncope and collapse 02/20/2022   UTI (urinary tract infection) 02/20/2022   Syncope 02/20/2022   Postural dizziness with presyncope 02/18/2022   Weakness 02/17/2022   Orthostatic hypotension 02/17/2022   Glaucoma 02/17/2022   Gastroenteropathy 08/28/2020   AKI (acute kidney injury) (HCC) 08/27/2020   Nausea vomiting and diarrhea 08/23/2020   Chronic anticoagulation 11/24/2018   CAD in native artery 11/24/2018   Chronic renal disease, stage III (HCC) 11/24/2018   Anemia 11/24/2018   Elevated troponin with new LBBB    Urinary urgency    Urinary frequency    Presence of permanent cardiac pacemaker    Joint pain    History of colon polyps    History of blood transfusion    Diarrhea    Arthritis    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   Preop cardiovascular exam 10/18/2015   Chest pain 11/09/2014   PAF (paroxysmal atrial fibrillation) (HCC) 11/09/2014   Essential hypertension 10/30/2014   Hyperlipidemia 10/30/2014   Hypothyroidism 10/30/2014   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 Lewayne Bunting)   JOINT REPLACEMENT Right    knee   LEFT HEART CATH AND CORONARY ANGIOGRAPHY N/A 11/01/2018   Procedure: LEFT HEART CATH AND CORONARY ANGIOGRAPHY;  Surgeon: Yvonne Kendall, MD;  [facility];  Service: Cardiovascular;  Laterality: N/A;   PERMANENT PACEMAKER INSERTION N/A 11/01/2014   Procedure: PERMANENT PACEMAKER INSERTION;  Surgeon: Marinus Maw, MD;   Location: [facility] CATH LAB;  Service: Cardiovascular;  Laterality: N/A;   stomach stapled  1985   TOTAL KNEE ARTHROPLASTY Left 11/05/2015   Procedure: TOTAL KNEE ARTHROPLASTY;  Surgeon: Dannielle Huh, MD;  Location: [facility];  Service: Orthopedics;  Laterality: Left;    OB History   No obstetric history on file.      Home Medications    Prior to Admission medications   Medication Sig Start Date End Date Taking? Authorizing Provider  predniSONE (DELTASONE) 20 MG tablet Take 2 tablets (40 mg total) by mouth daily with breakfast for 5 days. 11/08/23 11/13/23 Yes Epic Tribbett, Janace Aris, MD  acetaminophen (TYLENOL) 500 MG tablet Take 1 tablet (500 mg total) by mouth every 6 (six) hours as needed. 02/21/23   Derwood Kaplan, MD  apixaban (ELIQUIS) 2.5 MG TABS tablet Take 1 tablet (2.5 mg total) by mouth 2 (two) times daily. 12/03/22   Cannon Kettle, PA-C  Cholecalciferol (VITAMIN D3) 50 MCG (2000 UT) TABS Take 2,000 Units by mouth every morning.    [provider]  Cyanocobalamin (VITAMIN B12) 1000 MCG TBCR 1/2 tablet Orally Once a day 02/25/23   [provider]  dorzolamide (TRUSOPT) 2 % ophthalmic solution Place 1 drop into the left eye 2 (two) times daily. 11/25/21   [provider]  ferrous sulfate 325 (65 FE) MG tablet Take 325 mg by mouth daily with breakfast.    [provider]  levothyroxine (SYNTHROID, LEVOTHROID) 150 MCG tablet Take 150-225 mcg by mouth See admin instructions. Take one tablet (150 mcg) by mouth daily before breakfast, on 2 days of the week take an additional 1/2 tablet (75 mg) for a total dose on those days of 225 mcg 10/15/17   [provider]  metoprolol tartrate (LOPRESSOR) 25 MG tablet Take 0.5 tablets (12.5 mg total) by mouth 2 (two) times daily. 02/23/22   Ghimire, Werner Lean, MD  Multiple Vitamin (MULTIVITAMIN WITH MINERALS) TABS tablet Take 1 tablet by mouth every morning.    [provider]  pantoprazole (PROTONIX) 40 MG  tablet Take 40 mg by mouth every morning. 11/10/18   [provider]  rosuvastatin (CRESTOR) 40 MG tablet Take 1 tablet (40 mg total) by mouth daily. 12/03/22   Cannon Kettle, PA-C  vitamin C (ASCORBIC ACID) 250 MG tablet Take 250 mg by mouth every morning.    [provider]    Family History Family History  Problem Relation Age of Onset   Heart disease Father    Heart attack Father    Stroke Sister     Social History Social History   Tobacco Use   Smoking status: Never   Smokeless tobacco: Never  Vaping Use   Vaping status: Never Used  Substance Use Topics  Alcohol use: Yes    Alcohol/week: 1.0 standard drink of alcohol    Types: 1 Glasses of wine per week   Drug use: No     Allergies   Clindamycin/lincomycin, Methimazole, Olmesartan, and Amlodipine   Review of Systems Review of Systems  Skin:  Positive for rash.     Physical Exam Triage Vital Signs ED Triage Vitals  Encounter Vitals Group     BP 11/08/23 1219 (!) 187/84     Systolic BP Percentile --      Diastolic BP Percentile --      Pulse Rate 11/08/23 1219 85     Resp 11/08/23 1219 19     Temp 11/08/23 1219 97.6 F (36.4 C)     Temp Source 11/08/23 1219 Oral     SpO2 11/08/23 1219 98 %     Weight --      Height --      Head Circumference --      Peak Flow --      Pain Score 11/08/23 1218 6     Pain Loc --      Pain Education --      Exclude from Growth Chart --    No data found.  Updated Vital Signs BP (!) 187/84 (BP Location: Right Arm)   Pulse 85   Temp 97.6 F (36.4 C) (Oral)   Resp 19   SpO2 98%   Visual Acuity Right Eye Distance:   Left Eye Distance:   Bilateral Distance:    Right Eye Near:   Left Eye Near:    Bilateral Near:     Physical Exam Vitals reviewed.  Constitutional:      General: She is not in acute distress.    Appearance: She is not ill-appearing, toxic-appearing or diaphoretic.  Skin:    Coloration: Skin is not pale.     Comments:  There is some mildly erythematous maculopapular rash on her upper and lower back and on her anterior chest.  No sign of secondary infection.  There is a flatter area on her thoracic area of rash that is circular and about 3 cm in diameter that appears to be a healing dermatitis of a different type.  Neurological:     Mental Status: She is alert and oriented to person, place, and time.  Psychiatric:        Behavior: Behavior normal.      UC Treatments / Results  Labs (all labs ordered are listed, but only abnormal results are displayed) Labs Reviewed - No data to display  EKG   Radiology No results found.  Procedures Procedures (including critical care time)  Medications Ordered in UC Medications  methylPREDNISolone acetate (DEPO-MEDROL) injection 80 mg (has no administration in time range)    Initial Impression / Assessment and Plan / UC Course  I have reviewed the triage vital signs and the nursing notes.  Pertinent labs & imaging results that were available during my care of the patient were reviewed by me and considered in my medical decision making (see chart for details).      Depo-Medrol is given here and 5-day burst of prednisone is sent in.  I have recommended over-the-counter second-generation antihistamines to take as needed for the itching. Final Clinical Impressions(s) / UC Diagnoses   Final diagnoses:  Allergic contact dermatitis, unspecified trigger     Discharge Instructions      You have been given an injection of methylprednisolone 80 mg, steroid.  Take prednisone 20  mg--2 daily for 5 days  The above medications are to help with allergic reaction.  You can also take Zyrtec/cetirizine, fexofenadine/Allegra, or Claritin/loratadine as needed for itching.  These antihistamines are less likely to make you sleepy or dizzy.     ED Prescriptions     Medication Sig Dispense Auth. Provider   predniSONE (DELTASONE) 20 MG tablet Take 2 tablets (40 mg  total) by mouth daily with breakfast for 5 days. 10 tablet Marlinda Mike Janace Aris, MD      PDMP not reviewed this encounter.   Zenia Resides, MD 11/08/23 [phone]

## 2023-11-16 ENCOUNTER — Other Ambulatory Visit: Payer: Self-pay | Admitting: Physician Assistant

## 2023-11-25 ENCOUNTER — Other Ambulatory Visit: Payer: Self-pay | Admitting: Physician Assistant

## 2023-12-01 NOTE — Progress Notes (Signed)
Remote pacemaker transmission.   

## 2023-12-23 NOTE — Progress Notes (Signed)
 Cardiology Office Note  Date:  12/23/2023   ID:  Lauren Henry, DOB 1941/12/11, MRN [id number]  PCP:  Tisovec, Richard W, MD  Cardiologist:   Navi Ewton, MD   No chief complaint on file.     History of Present Illness: Lauren Henry is a 83 y.o. female  seen to establish follow up cardiac care. Previously seen by Dr Raford. She has  complete heart block s/p PPM, paroxysmal atrial fibrillation, mild-moderate CAD, and hypertension.  Ms. Hunton had a pacemaker implanted by Dr. Waddell in 2015 due to complete heart block.  She was minimally symptomatic and reports that it was incidentally found when she presented for knee surgery.  She has difficult to manage hypertension.  She had  an echo 09/13/18 that revealed LVEF 45-50% with diffuse hypokinesis and severe mitral annular calcification.  A Lexiscan  Myoview  10/07/18 that revealed a reversible defect in the inferior, apical and inferolateral regions.  This was associated with apical akinesis and inferoapical dyskinesis.  This was thought to be either ischemia or a pacemaker artifact.  Given that she was feeling well and has chronic kidney disease she elected medical management.  Clonidine  was increased due to poorly controlled blood pressure.  She was admitted with hypertensive urgency.  Her blood pressure was as high as the 240s at home.  Troponin was mildly elevated and she underwent cardiac catheterization 10/2018 that revealed mild to moderate non-obstructive CAD.  There was some concern about the proximal LAD.  However FFR was unremarkable.  In February 2023, patient was admitted for postural dizziness with presyncope.  This was felt to be due to orthostatic hypotension in the setting of multiple antihypertensives and a decreased oral intake.  If she was treated for UTI at that time.  Since then, she had no further episode of syncope.  Follow-up lab work shows significantly elevated FTs, therefore patient was readmitted to the hospital on  03/07/2022.  Since December 2022, she lost roughly 30 pounds due to lack of appetite.  Cardiology service was consulted during the hospitalization due to elevated troponin and LBBB.  She denied any chest pain or worsening dyspnea.  She was seen by Dr. Wonda.  Echocardiogram in March demonstrated EF 55 to 60%, no regional wall motion abnormality, moderate LVH, mild to moderate LAE, no significant valve issue.  Given the reassuring echocardiogram and the mildly elevated troponin in the setting of her CKD, it was unlikely that cardiac pathology was contributing to her failure to thrive.  During the hospitalization, patient also had parkinsonian features and resting tremors, MRI was contraindicated due to pacemaker.  Neurology recommended CT myelogram at some point as outpatient.  On subsequent outpatient visit her LFTs normalized.   She was readmitted 3/23-4/10/23 with acute angioedema with swelling involving her face, eye, neck and right arm. Required ventilator support. Treated with steroids and Pepcid  with improvement. Cause of her angioedema was never determined.   She fell in March and dislocated her left shoulder. She has been on Crestor  with reduction in LDL from 143 to 77. She is tolerating well. She denies any palpitations, dizziness. No SOB. BP at home has been excellent. Overall she has felt great although she did develop a chest cold over the holidays. BP diary reviewed and excellent control.  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, developed  CHB 10/2014 s/p Medtronic PPM; atrial fib (PAF)   History of blood transfusion as a child   no abnormal reaction noted   History of colon polyps    Hyperlipidemia    takes Pravastatin  daily   Hypertension    takes Azor  and Metoprolol  daily   Hypothyroidism    takes Synthroid  daily   Joint pain    Nausea vomiting and diarrhea 08/23/2020    Presence of permanent cardiac pacemaker    Urinary frequency    Urinary urgency     Past Surgical History:  Procedure Laterality Date   ABDOMINAL HYSTERECTOMY     cataract surgery Bilateral    CHOLECYSTECTOMY  1971   COLONOSCOPY     EYE SURGERY     INSERT / REPLACE / REMOVE PACEMAKER     Medtronic PPM 11/01/14 (Dr. Danelle Birmingham)   JOINT REPLACEMENT Right    knee   LEFT HEART CATH AND CORONARY ANGIOGRAPHY N/A 11/01/2018   Procedure: LEFT HEART CATH AND CORONARY ANGIOGRAPHY;  Surgeon: Mady Bruckner, MD;  [facility];  Service: Cardiovascular;  Laterality: N/A;   PERMANENT PACEMAKER INSERTION N/A 11/01/2014   Procedure: PERMANENT PACEMAKER INSERTION;  Surgeon: Danelle LELON Birmingham, MD;  Location: [facility] CATH LAB;  Service: Cardiovascular;  Laterality: N/A;   stomach stapled  1985   TOTAL KNEE ARTHROPLASTY Left 11/05/2015   Procedure: TOTAL KNEE ARTHROPLASTY;  Surgeon: Marcey Raman, MD;  Location: [facility];  Service: Orthopedics;  Laterality: Left;     Current Outpatient Medications  Medication Sig Dispense Refill   acetaminophen  (TYLENOL ) 500 MG tablet Take 1 tablet (500 mg total) by mouth every 6 (six) hours as needed. 30 tablet 0   apixaban  (ELIQUIS ) 2.5 MG TABS tablet Take 1 tablet (2.5 mg total) by mouth 2 (two) times daily. 180 tablet 3   Cholecalciferol  (VITAMIN D3) 50 MCG (2000 UT) TABS Take 2,000 Units by mouth every morning.     Cyanocobalamin  (VITAMIN B12) 1000 MCG TBCR 1/2 tablet Orally Once a day     dorzolamide  (TRUSOPT ) 2 % ophthalmic solution Place 1 drop into the left eye 2 (two) times daily.     ferrous sulfate  325 (65 FE) MG tablet Take 325 mg by mouth daily with breakfast.     levothyroxine  (SYNTHROID , LEVOTHROID) 150 MCG tablet Take 150-225 mcg by mouth See admin instructions. Take one tablet (150 mcg) by mouth daily before breakfast, on 2 days of the week take an additional 1/2 tablet (75 mg) for a total dose on those days of 225 mcg  0   metoprolol   tartrate (LOPRESSOR ) 25 MG tablet Take 0.5 tablets (12.5 mg total) by mouth 2 (two) times daily. 60 tablet 3   Multiple Vitamin (MULTIVITAMIN WITH MINERALS) TABS tablet Take 1 tablet by mouth every morning.     pantoprazole  (PROTONIX ) 40 MG tablet Take 40 mg by mouth every morning.  3   rosuvastatin  (CRESTOR ) 40 MG tablet TAKE 1 TABLET(40 MG) BY MOUTH DAILY 30 tablet 0   vitamin C (ASCORBIC ACID ) 250 MG tablet Take 250 mg by mouth every morning.     No current facility-administered medications for this visit.    Allergies:   Clindamycin /lincomycin, Methimazole, Olmesartan , and Amlodipine     Social History:  The patient  reports that she has never smoked. She has never used smokeless tobacco. She reports current alcohol  use of about 1.0 standard drink of alcohol  per week. She reports that she does not use drugs.   Family History:  The patient's family history includes Heart attack in her father; Heart disease in her father; Stroke in her sister.    ROS:  Please see the history of present illness.   Otherwise, review of systems are positive for none.   All other systems are reviewed and negative.    PHYSICAL EXAM: VS:  There were no vitals taken for this visit. , BMI There is no height or weight on file to calculate BMI. GENERAL:  Well appearing, overweight WF in NAD HEENT: Pupils equal round and reactive, fundi not visualized, oral mucosa unremarkable NECK:  No jugular venous distention, waveform within normal limits, carotid upstroke brisk and symmetric, no bruits LUNGS:  Clear to auscultation bilaterally HEART:  RRR.  PMI not displaced or sustained,S1 and S2 within normal limits, no S3, no S4, no clicks, no rubs, II/VI systolic murmur at LUSB ABD:  Flat, positive bowel sounds normal in frequency in pitch, no bruits, no rebound, no guarding, no midline pulsatile mass, no hepatomegaly, no splenomegaly EXT:  2 plus pulses throughout, no edema, no cyanosis no clubbing SKIN:  No rashes no  nodules NEURO:  Cranial nerves II through XII grossly intact, motor grossly intact throughout [facility]:  Cognitively intact, oriented to person place and time   EKG Interpretation Date/Time:  Thursday December 31 2023 15:23:17 EST Ventricular Rate:  98 PR Interval:  186 QRS Duration:  168 QT Interval:  430 QTC Calculation: 548 R Axis:   -70  Text Interpretation: Atrial-sensed ventricular-paced rhythm When compared with ECG of 22-Mar-2022 12:29, Electronic ventricular pacemaker has replaced Sinus rhythm Confirmed by Aqeel Norgaard [phone]) on 12/31/2023 3:26:28 PM     Echo 10/30/14: Study Conclusions  - Left ventricle: The cavity size was normal. There was mild focal   basal hypertrophy of the septum. Systolic function was normal.   The estimated ejection fraction was in the range of 60% to 65%.   Wall motion was normal; there were no regional wall motion   abnormalities. - Aortic valve: Trileaflet; mildly thickened, mildly calcified   leaflets. Transvalvular velocity was minimally increased. There   was no stenosis. - Mitral valve: Calcified annulus. There was mild regurgitation. - Right ventricle: The cavity size was mildly dilated. Wall   thickness was normal.   Echo 09/13/18: Study Conclusions   - Left ventricle: The cavity size was normal. There was mild focal   basal hypertrophy of the septum. Systolic function was mildly   reduced. The estimated ejection fraction was in the range of 45%   to 50%. Diffuse hypokinesis. Doppler parameters are consistent   with both elevated ventricular end-diastolic filling pressure and   elevated left atrial filling pressure. - Mitral valve: Severely calcified annulus. Mildly thickened,   mildly calcified leaflets . - Left atrium: The atrium was mildly dilated. - Atrial septum: No defect or patent foramen ovale was identified.   Lexiscan  Myoview  10/07/18:   The left ventricular ejection fraction is mildly decreased (45-54%). Nuclear stress  EF: 54%. No T wave inversion was noted during stress. There was no ST segment deviation noted during stress. Defect 1: There is a large defect of moderate severity.   Large size, moderate severity partially reversible inferior, apical and inferolateral perfusion defect, suggestive of ischemia. This could also represent pacemaker-related artifact. There is apical akinesis and inferoapical dyskinesis. LVEF 54%. This is an intermediate risk study. Clinical correlation is advised.   LHC 10/2018: Conclusions: Mild to moderate, non-obstructive CAD.  Most severe lesion is a 50-60% stenosis in  the proximal LCx that is not hemodynamically significant (DFR 1.0).  I suspect mild troponin elevation reflects supply-demand mismatch in the setting of uncontrolled hypertension and renal insufficiency. Normal left ventricular filling pressure.   Recommendations: Medical therapy and risk factor modification to prevent progression of disease. Consider noninvasive evaluation for renal artery stenosis (if not already performed as an outpatient), given hypertension and renal insufficiency.   Echo 03/14/22: IMPRESSIONS     1. Left ventricular ejection fraction, by estimation, is 55 to 60%. The  left ventricle has normal function. The left ventricle has no regional  wall motion abnormalities. There is moderate concentric left ventricular  hypertrophy. Left ventricular  diastolic parameters are consistent with Grade I diastolic dysfunction  (impaired relaxation).   2. Right ventricular systolic function is normal. The right ventricular  size is normal. There is normal pulmonary artery systolic pressure.   3. Left atrial size was mild to moderately dilated.   4. The mitral valve is abnormal. No evidence of mitral valve  regurgitation.   5. The aortic valve is calcified. Aortic valve regurgitation is not  visualized. Mild aortic valve stenosis.   Recent Labs: No results found for requested labs within last  365 days.    Lipid Panel    Component Value Date/Time   CHOL 152 06/23/2023 1058   TRIG 60 06/23/2023 1058   HDL 63 06/23/2023 1058   CHOLHDL 2.4 06/23/2023 1058   CHOLHDL 2.3 10/30/2018 1852   VLDL 10 10/30/2018 1852   LDLCALC 77 06/23/2023 1058   07/22/2019: Total cholesterol 127, triglycerides 80, HDL 42, LDL 69 Dated 08/28/21: Hgb 11.2. creatinine 1.5. BUN 30, otherwise CBC and CMET normal. Cholesterol 129, triglycerides 64, HDL 41, LDL 75. TSH normal. Dated 09/07/23: cholesterol 164, triglycerides 71, HDL 59, LDL 91. Apo B 79. CMET and TSh normal  Wt Readings from Last 3 Encounters:  06/30/23 175 lb 3.2 oz (79.5 kg)  02/20/23 174 lb (78.9 kg)  12/03/22 175 lb 6.4 oz (79.6 kg)      ASSESSMENT AND PLAN:  PAF:   Well controlled. Continue metoprolol   and Eliquis . On reduced Eliquis  due to age and fluctuating renal function Burden low by pacemaker follow up.   2.  Labile Hypertension:   Blood pressure well controlled per patient checks   3. Non-obstructive CAD: Left heart cath 10/2018 showed nonobstructive disease. Continue rosuvastatin .  She is not on aspirin  given that she is on Eliquis .  4. Hyperlipidemia- good response to Crestor - continue   5. CHB s/p PPM:   Managed by Dr. Waddell.     Current medicines are reviewed at length with the patient today.  The patient does not have concerns regarding medicines.  The following changes have been made: none  Labs/ tests ordered today include:   No orders of the defined types were placed in this encounter.     Disposition:  follow up in 6 months    Signed, Tron Flythe MD, [facility]  12/23/2023 9:02 AM    [facility]

## 2023-12-24 ENCOUNTER — Other Ambulatory Visit: Payer: Self-pay | Admitting: Physician Assistant

## 2023-12-31 ENCOUNTER — Ambulatory Visit: Payer: Medicare PPO | Attending: Cardiology | Admitting: Cardiology

## 2023-12-31 ENCOUNTER — Encounter: Payer: Self-pay | Admitting: Cardiology

## 2023-12-31 VITALS — BP 154/82 | HR 99 | Ht 64.0 in | Wt 175.0 lb

## 2023-12-31 DIAGNOSIS — I480 Paroxysmal atrial fibrillation: Secondary | ICD-10-CM

## 2023-12-31 DIAGNOSIS — I10 Essential (primary) hypertension: Secondary | ICD-10-CM | POA: Diagnosis not present

## 2023-12-31 DIAGNOSIS — E7800 Pure hypercholesterolemia, unspecified: Secondary | ICD-10-CM

## 2023-12-31 DIAGNOSIS — I2510 Atherosclerotic heart disease of native coronary artery without angina pectoris: Secondary | ICD-10-CM | POA: Diagnosis not present

## 2023-12-31 DIAGNOSIS — I442 Atrioventricular block, complete: Secondary | ICD-10-CM

## 2023-12-31 NOTE — Patient Instructions (Signed)
 Medication Instructions:  Continue same medications *If you need a refill on your cardiac medications before your next appointment, please call your pharmacy*   Lab Work: None ordered   Testing/Procedures: None ordered   Follow-Up: At [facility], you and your health needs are our priority.  As part of our continuing mission to provide you with exceptional heart care, we have created designated Provider Care Teams.  These Care Teams include your primary Cardiologist (physician) and Advanced Practice Providers (APPs -  Physician Assistants and Nurse Practitioners) who all work together to provide you with the care you need, when you need it.  We recommend signing up for the patient portal called MyChart.  Sign up information is provided on this After Visit Summary.  MyChart is used to connect with patients for Virtual Visits (Telemedicine).  Patients are able to view lab/test results, encounter notes, upcoming appointments, etc.  Non-urgent messages can be sent to your provider as well.   To learn more about what you can do with MyChart, go to [email or web address].    Your next appointment:  6 months   Call in March to schedule July appointment     Provider:  Dr.Jordan

## 2024-02-02 ENCOUNTER — Ambulatory Visit (INDEPENDENT_AMBULATORY_CARE_PROVIDER_SITE_OTHER): Payer: Medicare PPO

## 2024-02-02 DIAGNOSIS — I442 Atrioventricular block, complete: Secondary | ICD-10-CM

## 2024-02-03 LAB — CUP PACEART REMOTE DEVICE CHECK
Battery Impedance: 1692 Ohm
Battery Remaining Longevity: 40 mo
Battery Voltage: 2.75 V
Brady Statistic AP VP Percent: 28 %
Brady Statistic AP VS Percent: 0 %
Brady Statistic AS VP Percent: 70 %
Brady Statistic AS VS Percent: 3 %
Date Time Interrogation Session: 20250211194645
Implantable Lead Connection Status: 753985
Implantable Lead Connection Status: 753985
Implantable Lead Implant Date: 20151111
Implantable Lead Implant Date: 20151111
Implantable Lead Location: 753859
Implantable Lead Location: 753860
Implantable Lead Model: 5076
Implantable Lead Model: 5076
Implantable Pulse Generator Implant Date: 20151111
Lead Channel Impedance Value: 491 Ohm
Lead Channel Impedance Value: 657 Ohm
Lead Channel Pacing Threshold Amplitude: 0.5 V
Lead Channel Pacing Threshold Amplitude: 1.125 V
Lead Channel Pacing Threshold Pulse Width: 0.4 ms
Lead Channel Pacing Threshold Pulse Width: 0.4 ms
Lead Channel Setting Pacing Amplitude: 1.5 V
Lead Channel Setting Pacing Amplitude: 2.5 V
Lead Channel Setting Pacing Pulse Width: 0.46 ms
Lead Channel Setting Sensing Sensitivity: 4 mV
Zone Setting Status: 755011
Zone Setting Status: 755011

## 2024-02-07 ENCOUNTER — Encounter: Payer: Self-pay | Admitting: Internal Medicine

## 2024-02-27 IMAGING — DX DG HAND COMPLETE 3+V*R*
3 series · 3 of 3 positions shown · non-contrast
Comparison: None.

CLINICAL DATA: Pain and swelling right thumb and index finger as
well as along the metacarpals no known injury.

EXAM:
RIGHT HAND - COMPLETE 3+ VIEW

[hand pa]
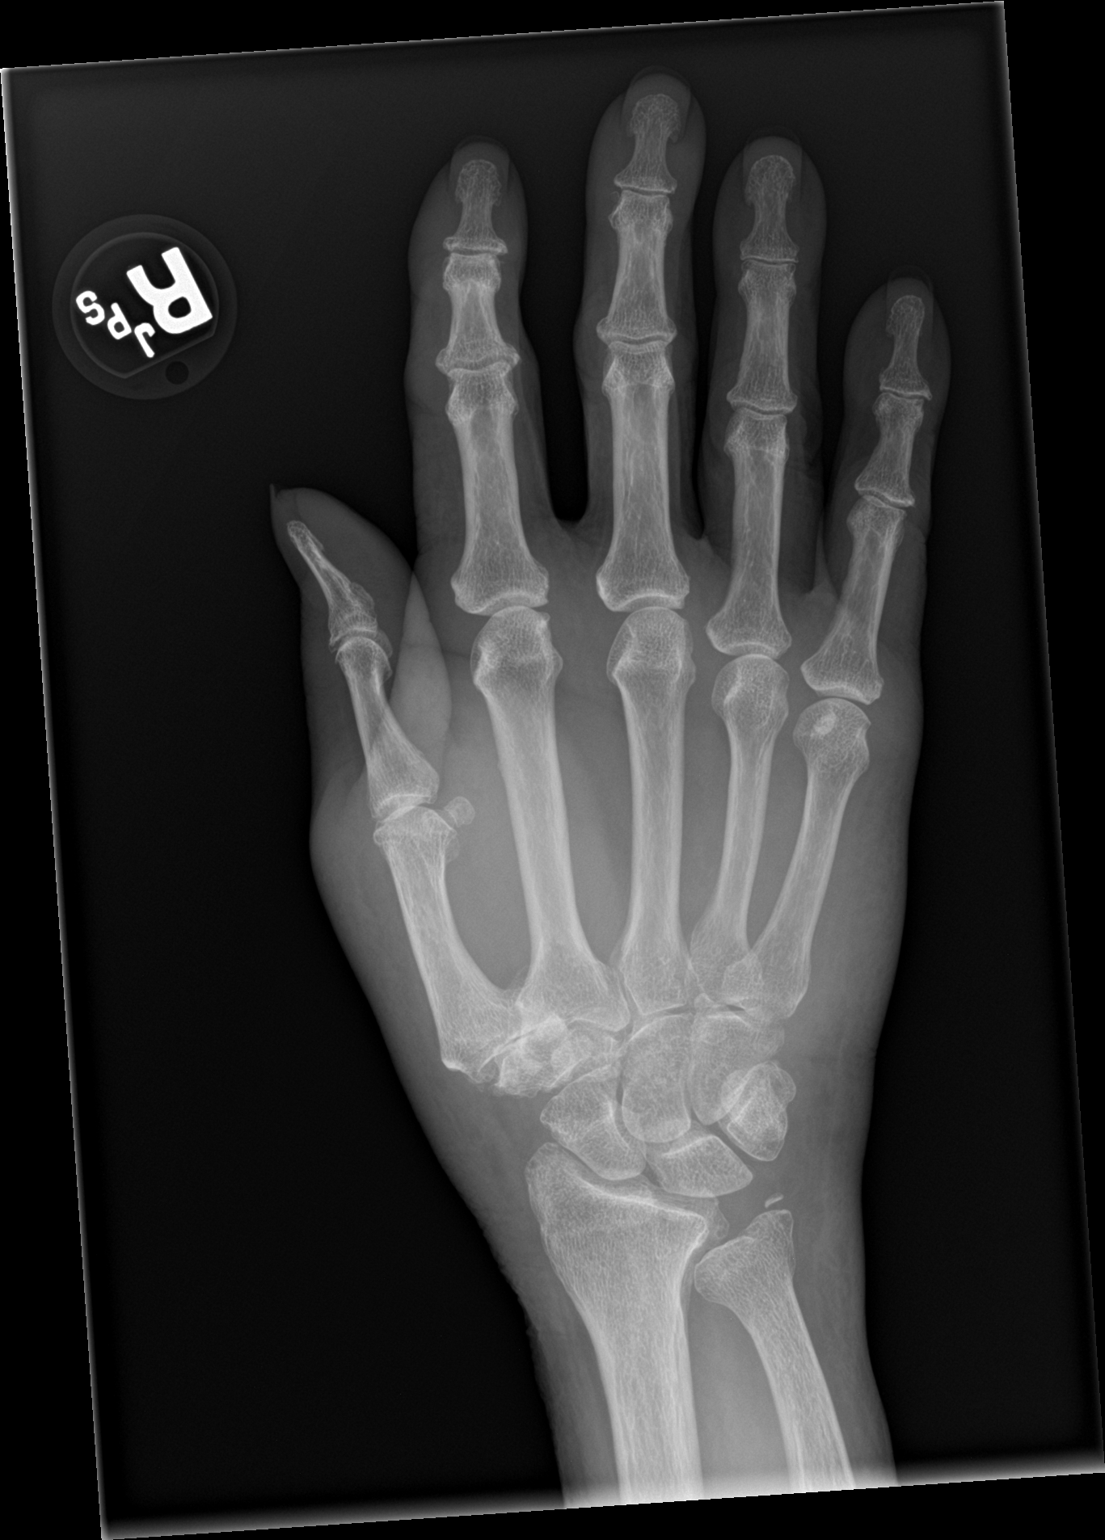

[hand lat]
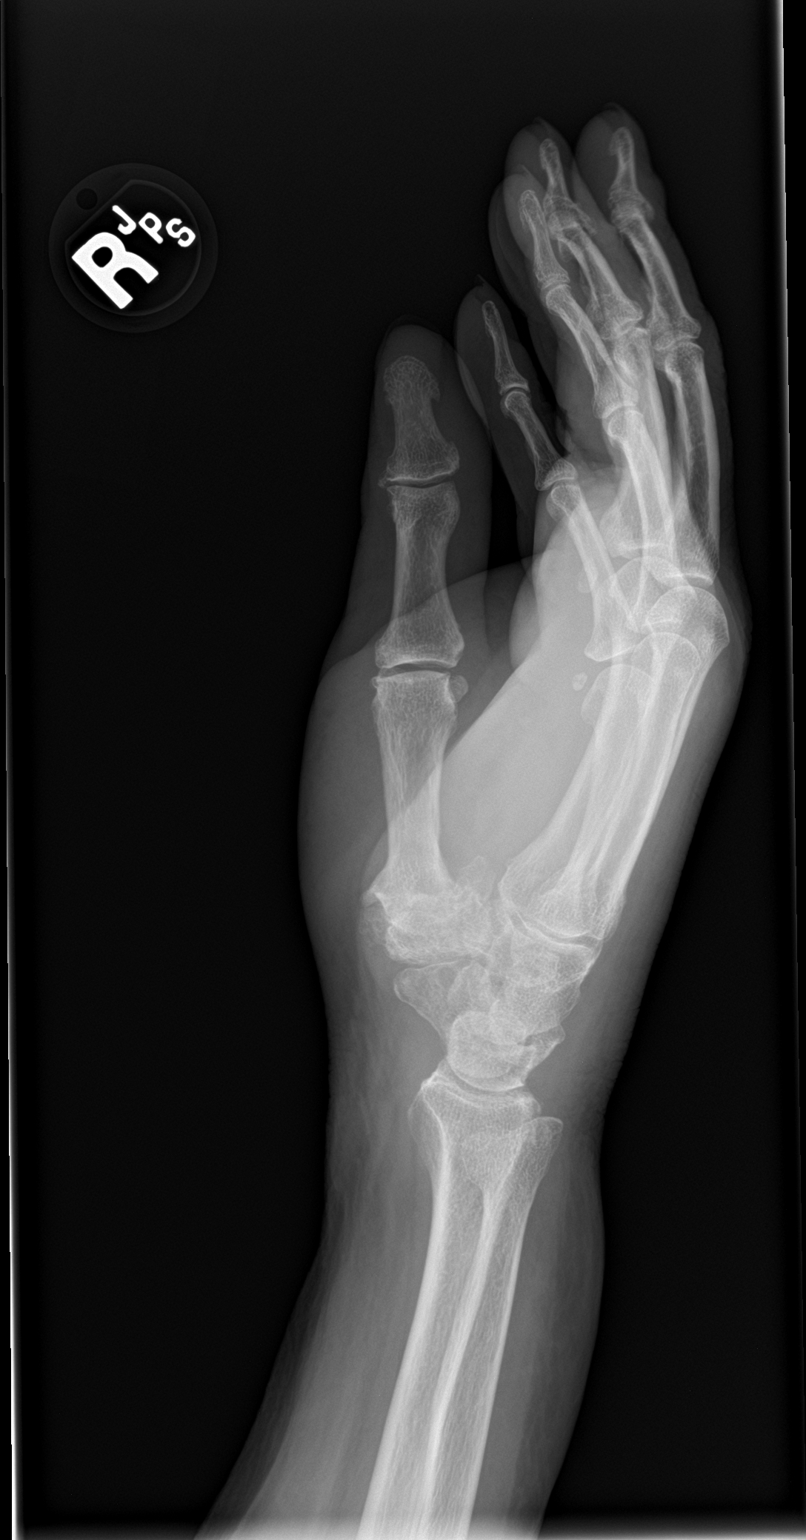

[hand obl]
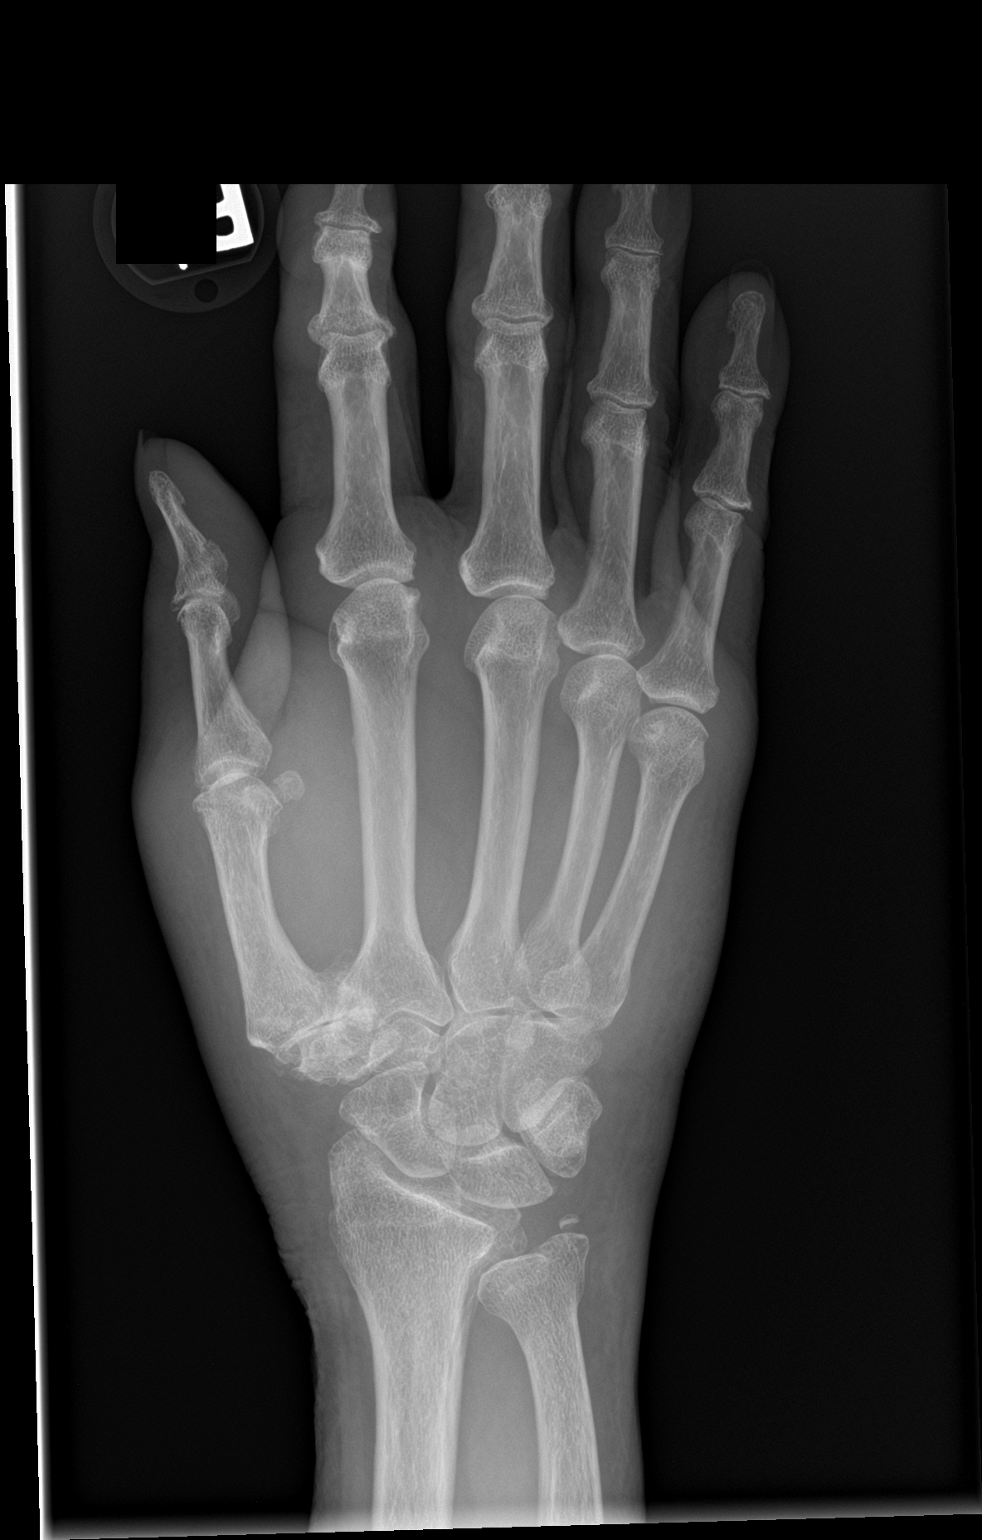

[3 of 3 positions shown; findings below may reference images not displayed]

FINDINGS: There is no evidence of fracture or dislocation. Severe degenerative
change at the first CMC joint with mild degenerative change of the
distal greater than proximal interphalangeal joints. Irregularity of
the distal radius and ulnar styloid process likely reflect sequela
of prior injury. Negative ulnar variance.
IMPRESSION: 1. No acute osseous abnormality identified.
2. Severe degenerative change at the first CMC joint.
3. Mild degenerative change of the distal greater than proximal
interphalangeal joints.

## 2024-02-28 IMAGING — DX DG CHEST 1V PORT
1 series · 1 of 1 positions shown · non-contrast
Comparison: 03/13/2022

CLINICAL DATA: Hyperglycemia

EXAM:
PORTABLE CHEST 1 VIEW

[chest ap]
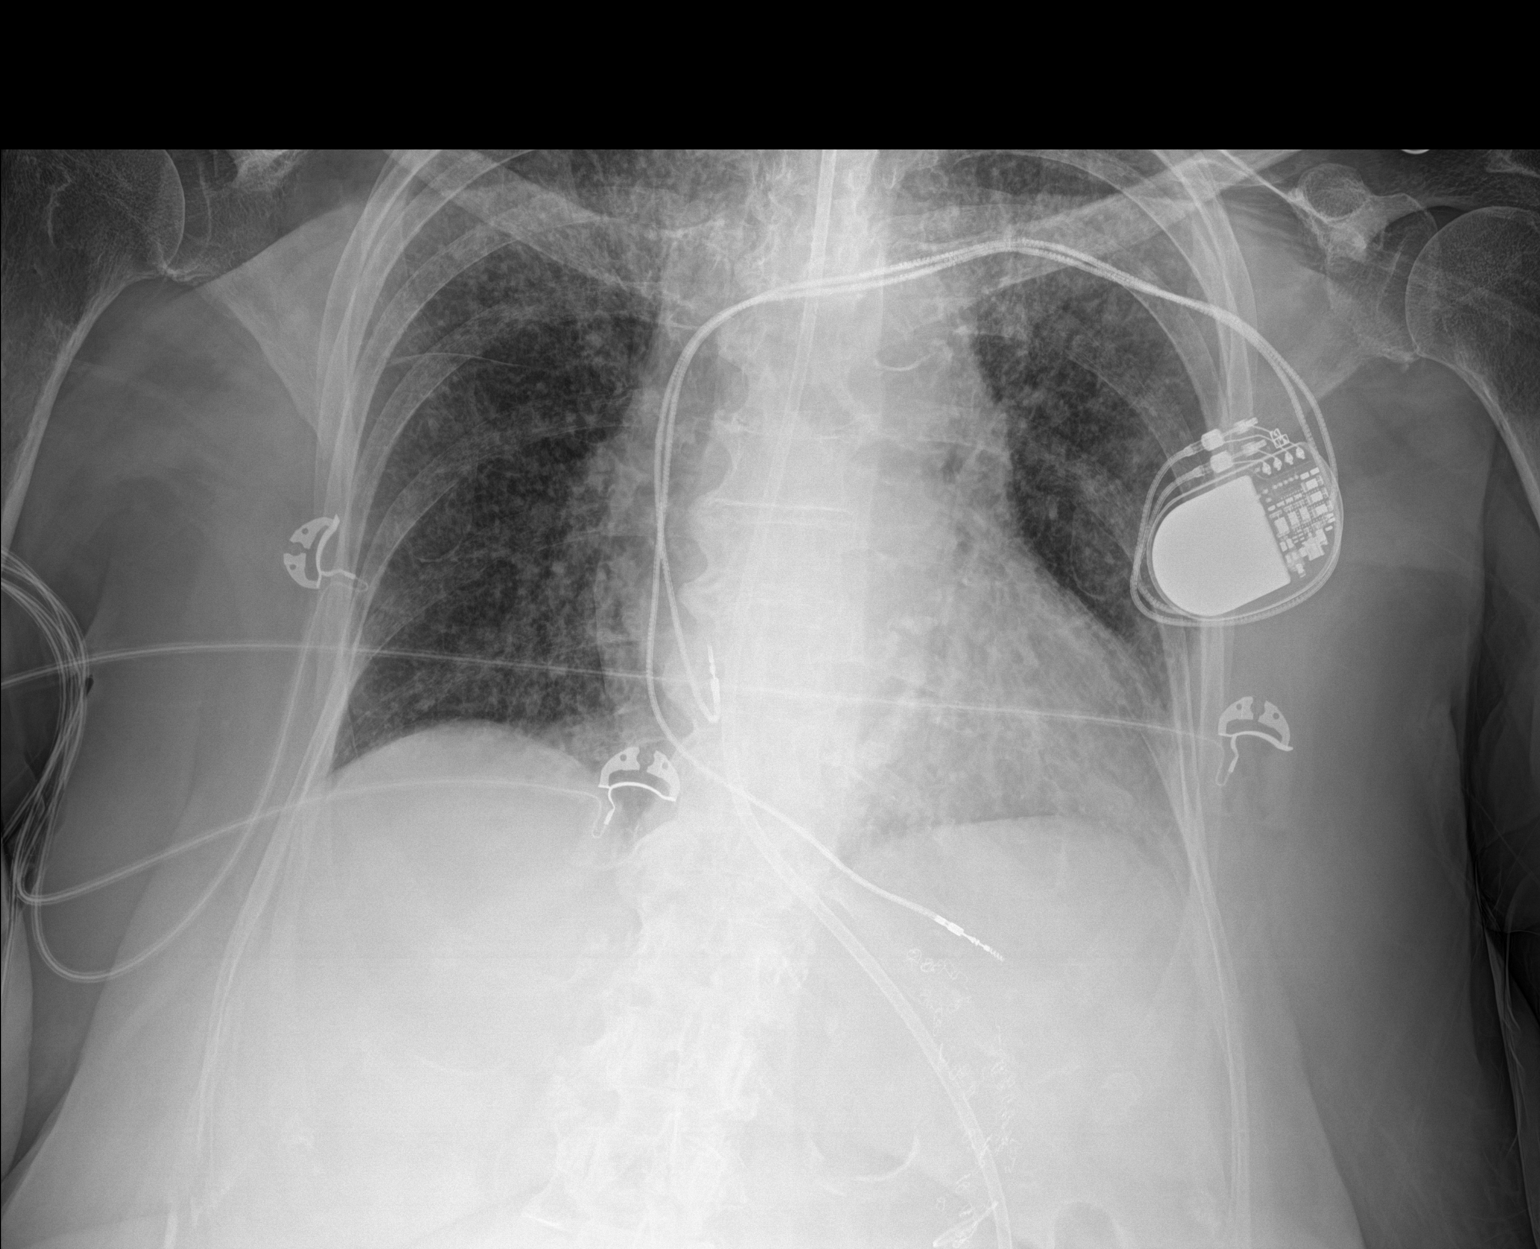

[1 of 1 positions shown; findings below may reference images not displayed]

FINDINGS: Single frontal view of the chest demonstrates enteric catheter
passing below diaphragm tip excluded by collimation. Dual lead
pacemaker unchanged. Cardiac silhouette is stable. Interval increase
in diffuse interstitial and ground-glass opacities compatible with
worsening volume status. No effusion or pneumothorax. No acute bony
abnormalities.
IMPRESSION: 1. Increasing bilateral interstitial and ground-glass opacities,
consistent with developing edema.

## 2024-02-29 IMAGING — DX DG CHEST 1V PORT
1 series · 1 of 1 positions shown · non-contrast
Comparison: Chest radiograph dated 03/14/2022.

CLINICAL DATA: Status post intubation.

EXAM:
PORTABLE CHEST 1 VIEW

[chest ap]
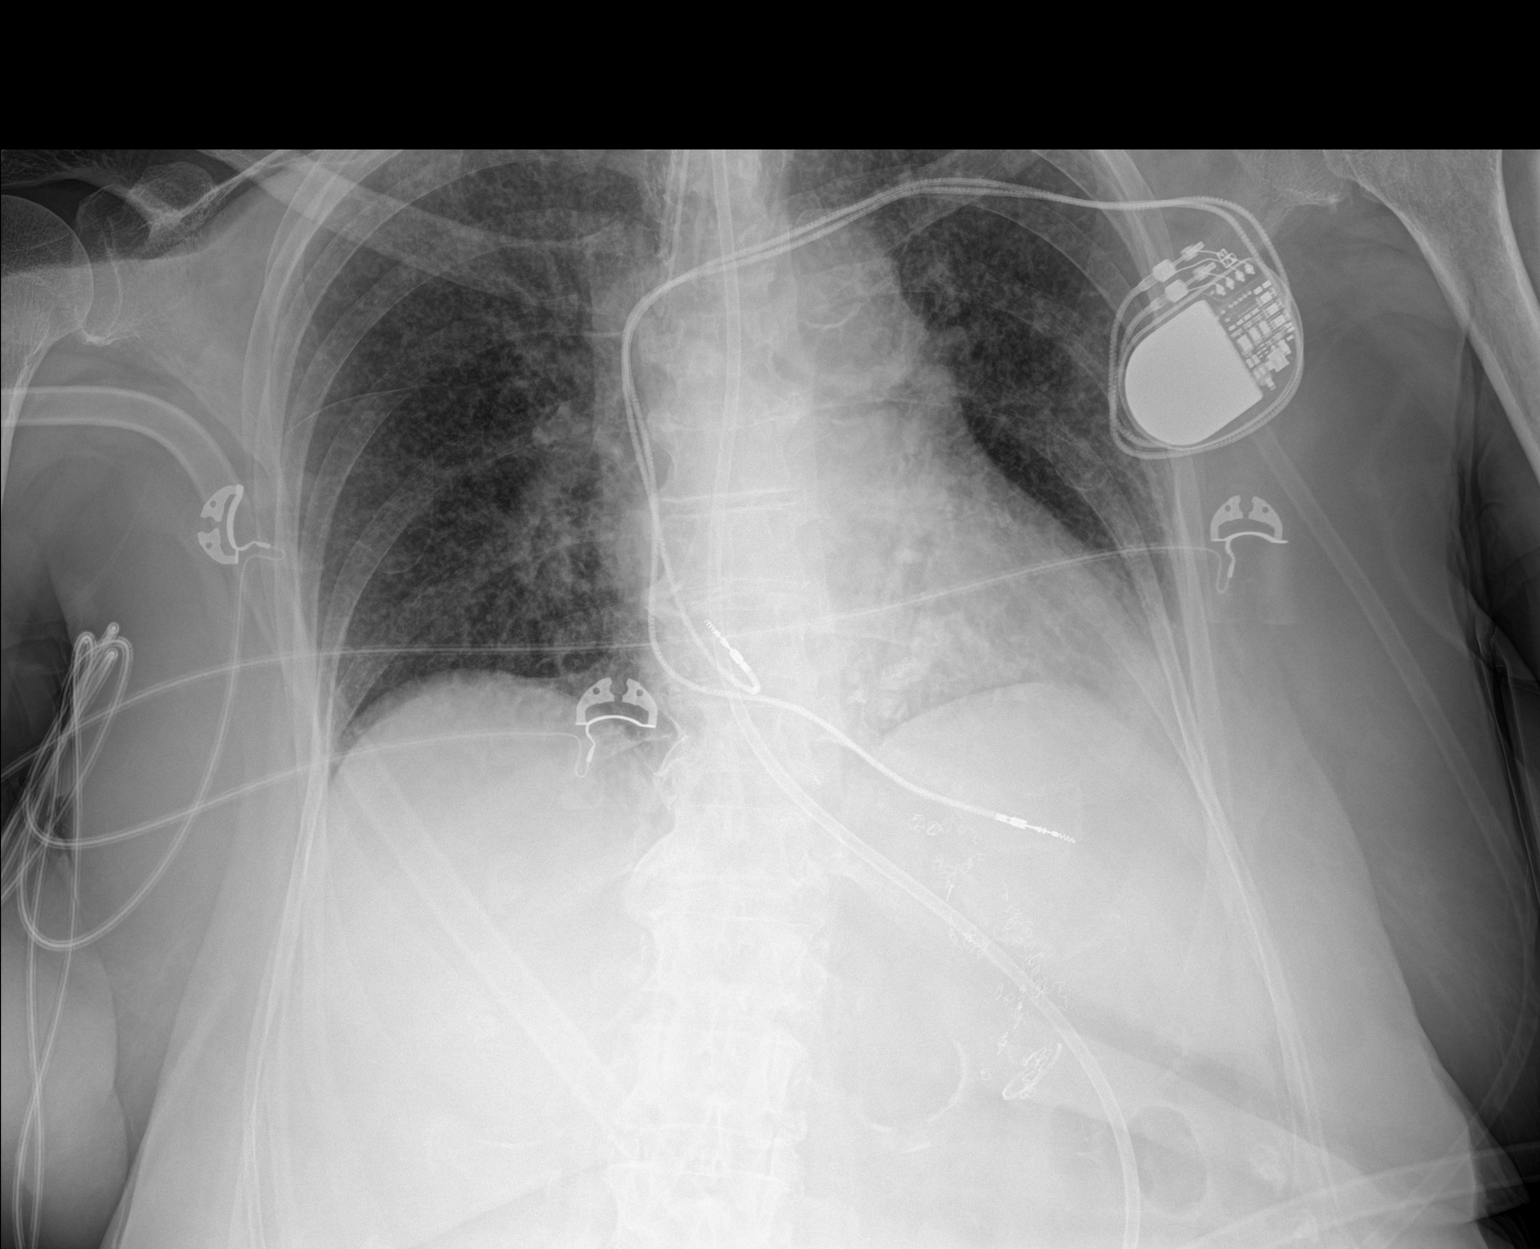

[1 of 1 positions shown; findings below may reference images not displayed]

FINDINGS: Endotracheal tube with tip just above the carina. Recommend
retraction by 3 cm for optimal position. Feeding tube extends below
the diaphragm with tip beyond the inferior margin of the image. No
significant interval change in the diffuse bilateral pulmonary
nodularity. No consolidative changes. There is no pleural effusion
pneumothorax. Stable cardiomediastinal silhouette. Atherosclerotic
calcification of the aorta. Left sided pacemaker device. No acute
osseous pathology.
IMPRESSION: Endotracheal tube with tip just above the carina. Recommend
retraction by 3 cm for optimal position.

## 2024-03-01 IMAGING — US US RENAL
1 series · 13 of 25 positions shown · non-contrast
Comparison: CT, 03/09/2022.  Renal ultrasound, 02/22/2020.

CLINICAL DATA: Acute kidney injury.

EXAM:
RENAL / URINARY TRACT ULTRASOUND COMPLETE

[Series 1: us renal · 13 of 32 slices shown]
[im 1/32]
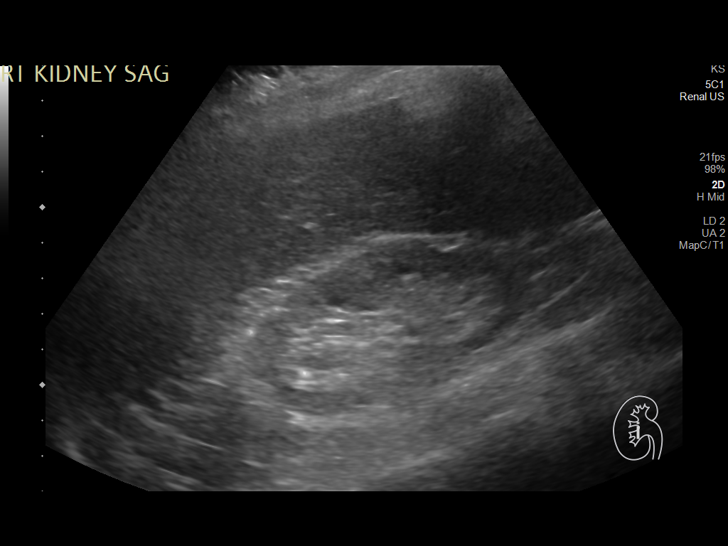
[im 3/32]
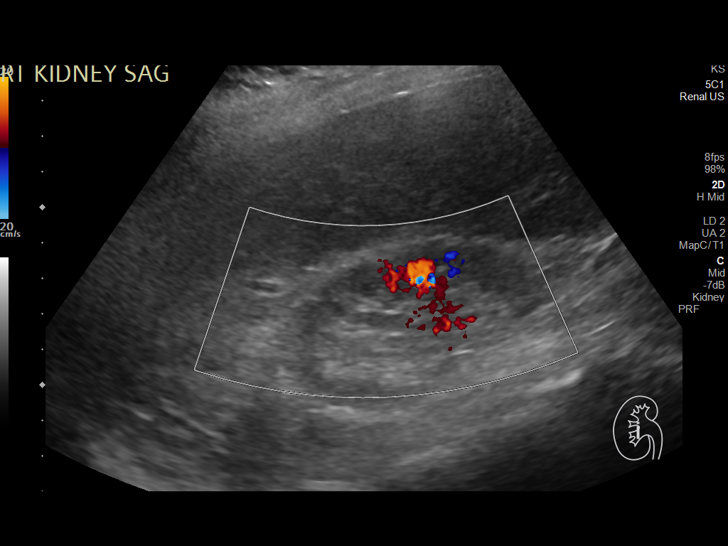
[im 6/32]
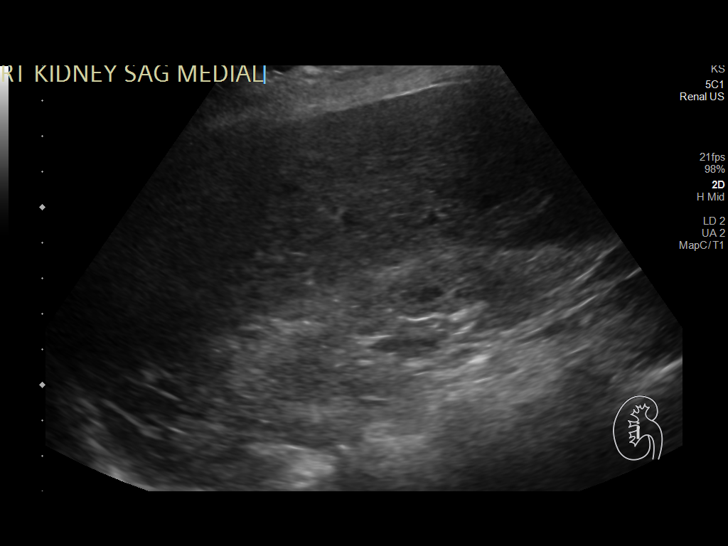
[im 8/32]
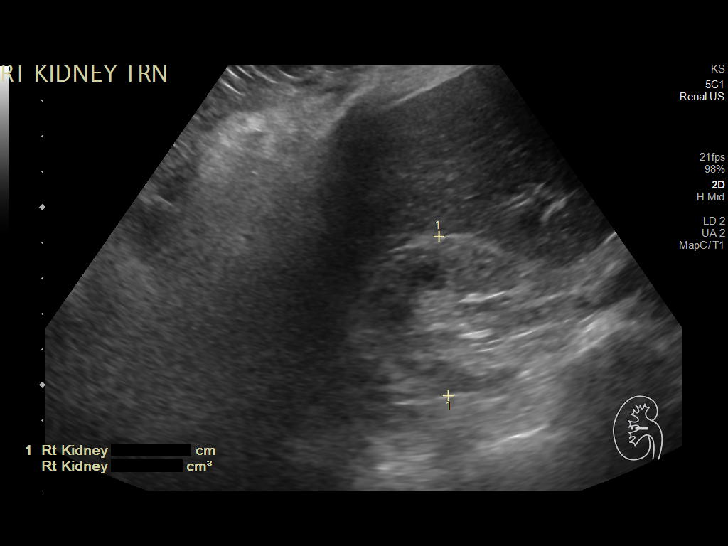
[im 11/32]
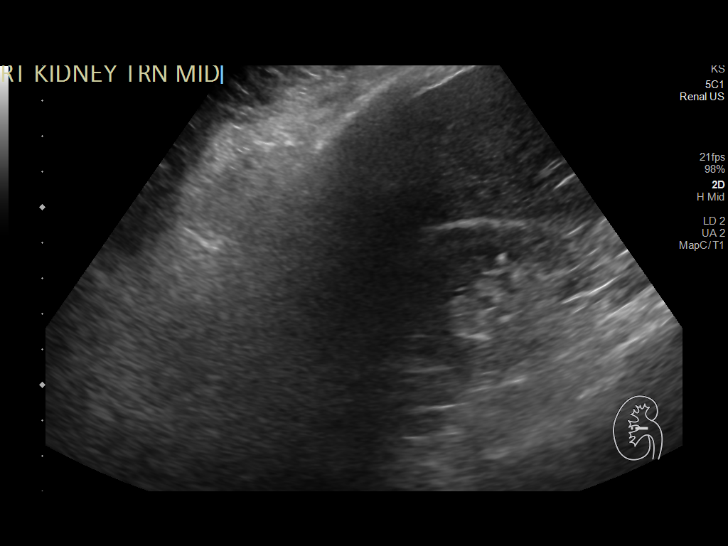
[im 13/32]
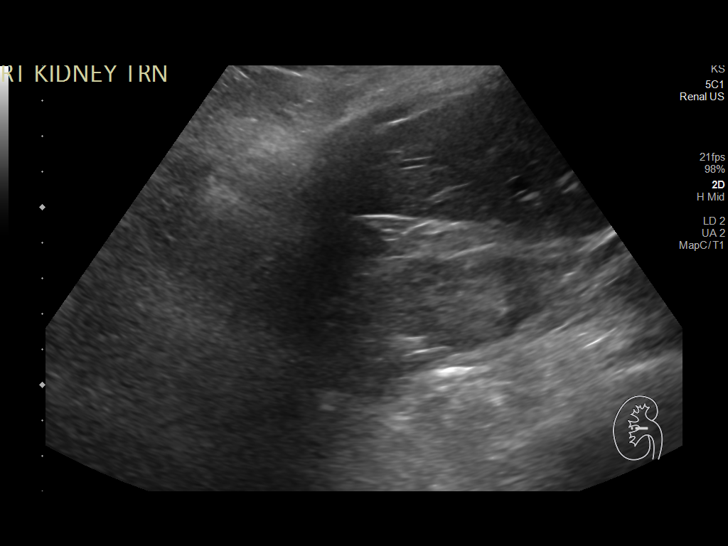
[im 16/32]
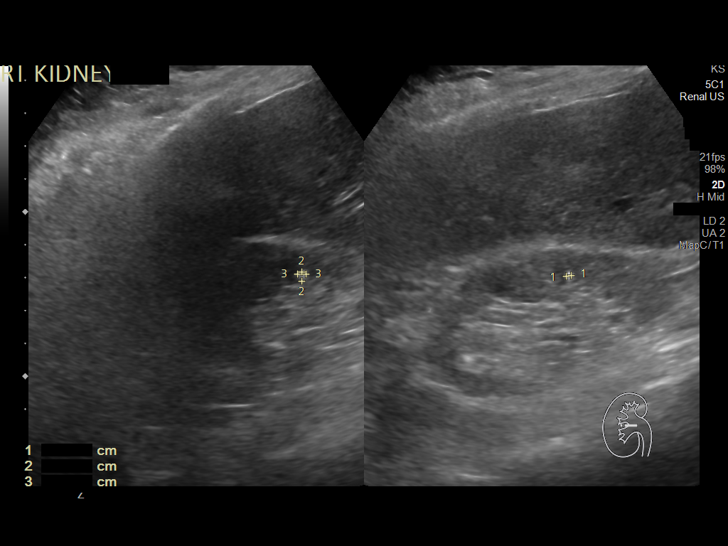
[im 19/32]
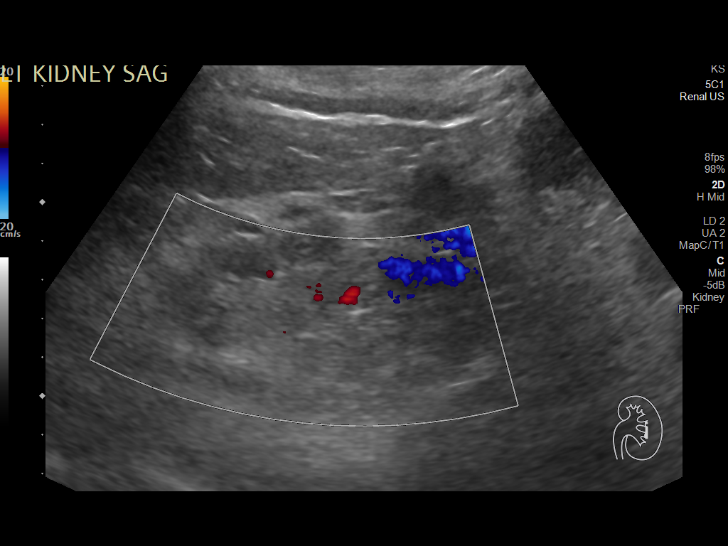
[im 21/32]
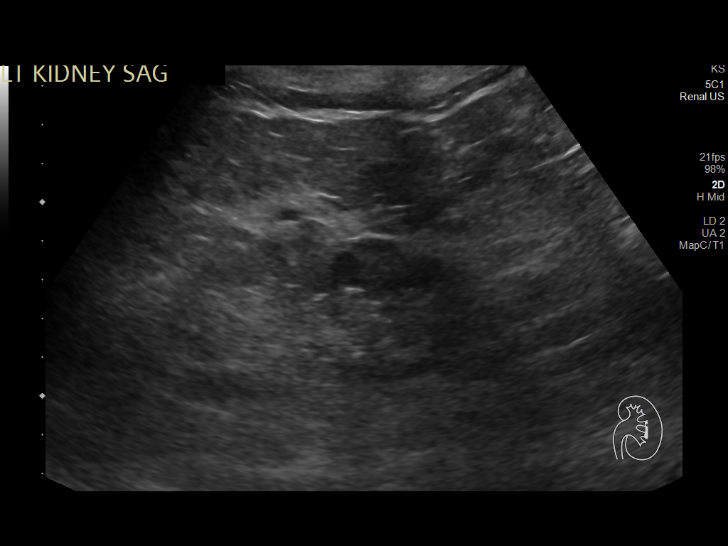
[im 24/32]
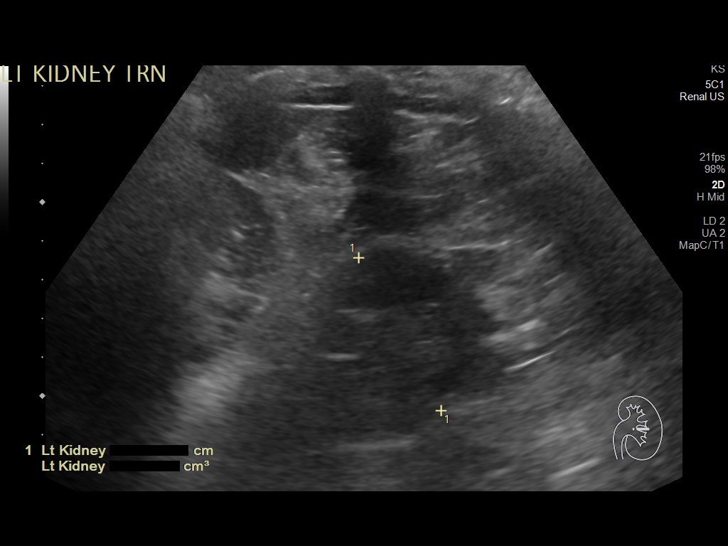
[im 26/32]
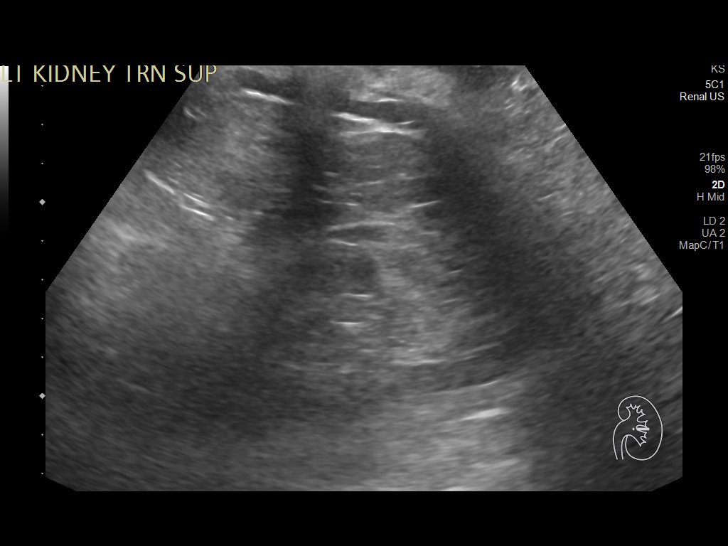
[im 29/32]
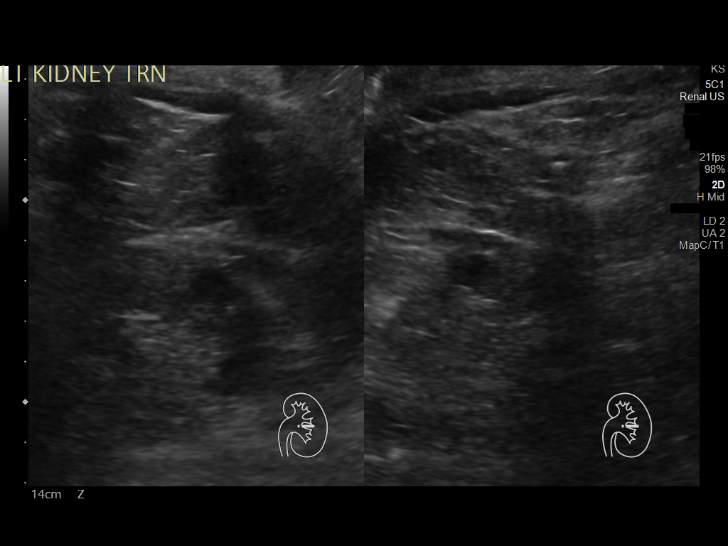
[im 32/32]
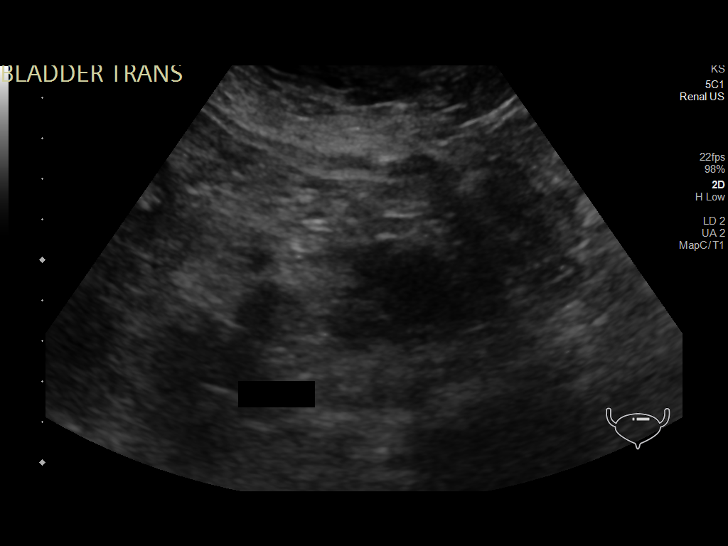

[13 of 25 positions shown; findings below may reference images not displayed]

FINDINGS: Right Kidney:

Renal measurements: 8.2 x 4.1 x 4.5 cm = volume: 78.7 mL. Increased
parenchymal echogenicity. Diffuse cortical thinning. Small echogenic
focus near the corticomedullary junction of mid to lower pole,
likely a calcification. No defined mass. No hydronephrosis.

Left Kidney:

Renal measurements: 8.0 x 4.1 x 4.5 cm = volume: 76.9 mL. Increased
parenchymal echogenicity. Small cystic lesion from the mid to lower
pole, 1.1 x 1.0 x 1.2 cm. No other masses or lesions. No
hydronephrosis.

Bladder:

Decompressed by Foley catheter.

Other:

None.
IMPRESSION: 1. No acute findings.  No hydronephrosis.
2. Bilateral small and echogenic kidneys with diffuse cortical
thinning consistent with medical renal disease.
3. Small calcification in the right kidney may be vascular, felt to
be incidental.
4. 1.2 cm cystic lesion in the left kidney, not fully characterized
and not evident on the prior ultrasound or more recent unenhanced
CT. Consider further assessment with renal MRI without and with
contrast, versus follow-up ultrasound in 6 months to assess for
stability.

## 2024-03-03 IMAGING — DX DG CHEST 1V PORT
1 series · 1 of 1 positions shown · non-contrast
Comparison: Chest x-ray dated March 15, 2022

CLINICAL DATA: Respiratory failure

EXAM:
PORTABLE CHEST 1 VIEW

[chest ap]
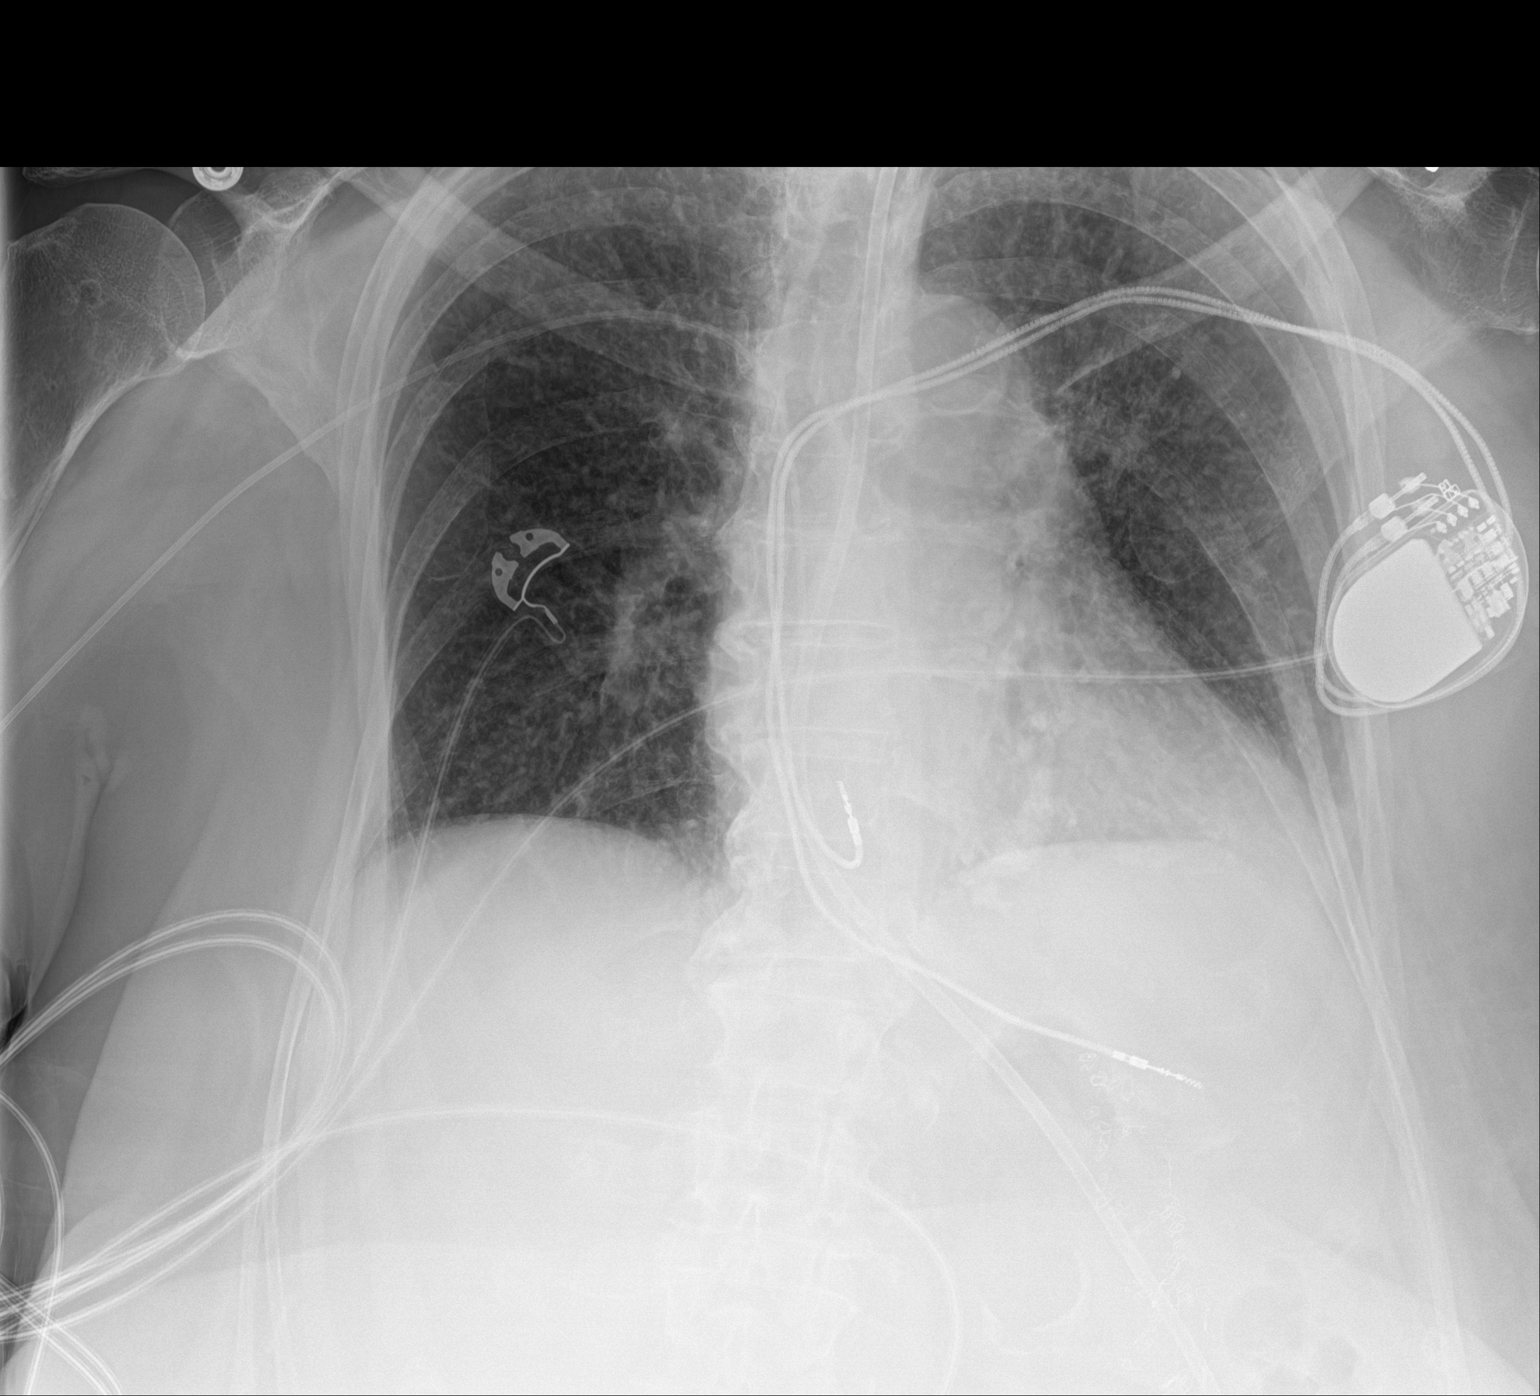

[1 of 1 positions shown; findings below may reference images not displayed]

FINDINGS: Interval extubation. Interval placement right arm PICC with tip
positioned near the expected area of the superior cavoatrial
junction. Feeding tube partially seen coursing below the diaphragm.
Left chest wall dual lead pacer with unchanged lead position. No
focal consolidation. No large pleural effusion or pneumothorax.
IMPRESSION: 1. Interval extubation.
2. Interval placement right arm PICC with tip positioned near the
expected area of the superior cavoatrial junction.

## 2024-03-14 NOTE — Addendum Note (Signed)
 Addended by: Geralyn Flash D on: 03/14/2024 04:20 PM   Modules accepted: Orders

## 2024-03-14 NOTE — Progress Notes (Signed)
 Remote pacemaker transmission.

## 2024-03-16 DIAGNOSIS — I1310 Hypertensive heart and chronic kidney disease without heart failure, with stage 1 through stage 4 chronic kidney disease, or unspecified chronic kidney disease: Secondary | ICD-10-CM | POA: Diagnosis not present

## 2024-03-16 DIAGNOSIS — I130 Hypertensive heart and chronic kidney disease with heart failure and stage 1 through stage 4 chronic kidney disease, or unspecified chronic kidney disease: Secondary | ICD-10-CM | POA: Diagnosis not present

## 2024-03-16 DIAGNOSIS — N184 Chronic kidney disease, stage 4 (severe): Secondary | ICD-10-CM | POA: Diagnosis not present

## 2024-03-16 DIAGNOSIS — E669 Obesity, unspecified: Secondary | ICD-10-CM | POA: Diagnosis not present

## 2024-03-16 DIAGNOSIS — E7800 Pure hypercholesterolemia, unspecified: Secondary | ICD-10-CM | POA: Diagnosis not present

## 2024-03-16 DIAGNOSIS — I5032 Chronic diastolic (congestive) heart failure: Secondary | ICD-10-CM | POA: Diagnosis not present

## 2024-03-16 DIAGNOSIS — E039 Hypothyroidism, unspecified: Secondary | ICD-10-CM | POA: Diagnosis not present

## 2024-03-16 DIAGNOSIS — I480 Paroxysmal atrial fibrillation: Secondary | ICD-10-CM | POA: Diagnosis not present

## 2024-03-16 DIAGNOSIS — G20A1 Parkinson's disease without dyskinesia, without mention of fluctuations: Secondary | ICD-10-CM | POA: Diagnosis not present

## 2024-03-29 DIAGNOSIS — D631 Anemia in chronic kidney disease: Secondary | ICD-10-CM | POA: Diagnosis not present

## 2024-03-29 DIAGNOSIS — I1310 Hypertensive heart and chronic kidney disease without heart failure, with stage 1 through stage 4 chronic kidney disease, or unspecified chronic kidney disease: Secondary | ICD-10-CM | POA: Diagnosis not present

## 2024-03-29 DIAGNOSIS — R197 Diarrhea, unspecified: Secondary | ICD-10-CM | POA: Diagnosis not present

## 2024-03-29 DIAGNOSIS — I480 Paroxysmal atrial fibrillation: Secondary | ICD-10-CM | POA: Diagnosis not present

## 2024-03-29 DIAGNOSIS — G20C Parkinsonism, unspecified: Secondary | ICD-10-CM | POA: Diagnosis not present

## 2024-03-29 DIAGNOSIS — Z7901 Long term (current) use of anticoagulants: Secondary | ICD-10-CM | POA: Diagnosis not present

## 2024-03-29 DIAGNOSIS — N184 Chronic kidney disease, stage 4 (severe): Secondary | ICD-10-CM | POA: Diagnosis not present

## 2024-03-29 DIAGNOSIS — I130 Hypertensive heart and chronic kidney disease with heart failure and stage 1 through stage 4 chronic kidney disease, or unspecified chronic kidney disease: Secondary | ICD-10-CM | POA: Diagnosis not present

## 2024-05-03 ENCOUNTER — Ambulatory Visit (INDEPENDENT_AMBULATORY_CARE_PROVIDER_SITE_OTHER): Payer: Medicare PPO

## 2024-05-03 DIAGNOSIS — I442 Atrioventricular block, complete: Secondary | ICD-10-CM

## 2024-05-04 LAB — CUP PACEART REMOTE DEVICE CHECK
Battery Impedance: 1806 Ohm
Battery Remaining Longevity: 37 mo
Battery Voltage: 2.75 V
Brady Statistic AP VP Percent: 28 %
Brady Statistic AP VS Percent: 0 %
Brady Statistic AS VP Percent: 69 %
Brady Statistic AS VS Percent: 2 %
Date Time Interrogation Session: 20250513141350
Implantable Lead Connection Status: 753985
Implantable Lead Connection Status: 753985
Implantable Lead Implant Date: 20151111
Implantable Lead Implant Date: 20151111
Implantable Lead Location: 753859
Implantable Lead Location: 753860
Implantable Lead Model: 5076
Implantable Lead Model: 5076
Implantable Pulse Generator Implant Date: 20151111
Lead Channel Impedance Value: 512 Ohm
Lead Channel Impedance Value: 623 Ohm
Lead Channel Pacing Threshold Amplitude: 0.5 V
Lead Channel Pacing Threshold Amplitude: 1.375 V
Lead Channel Pacing Threshold Pulse Width: 0.4 ms
Lead Channel Pacing Threshold Pulse Width: 0.4 ms
Lead Channel Setting Pacing Amplitude: 1.5 V
Lead Channel Setting Pacing Amplitude: 2.5 V
Lead Channel Setting Pacing Pulse Width: 0.46 ms
Lead Channel Setting Sensing Sensitivity: 4 mV
Zone Setting Status: 755011
Zone Setting Status: 755011

## 2024-05-09 ENCOUNTER — Ambulatory Visit: Payer: Self-pay | Admitting: Internal Medicine

## 2024-06-17 NOTE — Addendum Note (Signed)
 Addended by: VICCI SELLER A on: 06/17/2024 09:10 AM   Modules accepted: Orders

## 2024-06-17 NOTE — Progress Notes (Signed)
 Remote pacemaker transmission.

## 2024-06-20 ENCOUNTER — Other Ambulatory Visit: Payer: Self-pay

## 2024-06-20 MED ORDER — ROSUVASTATIN CALCIUM 40 MG PO TABS: 40.0000 mg | ORAL_TABLET | Freq: Every day | ORAL | 1 refills | Status: DC

## 2024-06-27 NOTE — Progress Notes (Signed)
 Cardiology Office Note  Date:  07/01/2024   ID:  Lauren Henry, DOB 1941-01-16, MRN [id number]  PCP:  Tisovec, Richard W, MD  Cardiologist:   Damyen Knoll [place], MD   Chief Complaint  Patient presents with   Coronary Artery Disease   Atrial Fibrillation      History of Present Illness: Lauren Henry is a 83 y.o. female  seen to establish follow up cardiac care. Previously seen by Dr Raford. She has  complete heart block s/p PPM, paroxysmal atrial fibrillation, mild-moderate CAD, and hypertension.  [name] had a pacemaker implanted by Dr. Waddell in 2015 due to complete heart block.  She was minimally symptomatic and reports that it was incidentally found when she presented for knee surgery.  She has difficult to manage hypertension.  She had  an echo 09/13/18 that revealed LVEF 45-50% with diffuse hypokinesis and severe mitral annular calcification.  A Lexiscan  Myoview  10/07/18 that revealed a reversible defect in the inferior, apical and inferolateral regions.  This was associated with apical akinesis and inferoapical dyskinesis.  This was thought to be either ischemia or a pacemaker artifact.  Given that she was feeling well and has chronic kidney disease she elected medical management.  Clonidine  was increased due to poorly controlled blood pressure.  She was admitted with hypertensive urgency.  Her blood pressure was as high as the 240s at home.  Troponin was mildly elevated and she underwent cardiac catheterization 10/2018 that revealed mild to moderate non-obstructive CAD.  There was some concern about the proximal LAD.  However FFR was unremarkable.  In February 2023, patient was admitted for postural dizziness with presyncope.  This was felt to be due to orthostatic hypotension in the setting of multiple antihypertensives and a decreased oral intake.  If she was treated for UTI at that time.  Since then, she had no further episode of syncope.  Follow-up lab work shows significantly  elevated FTs, therefore patient was readmitted to the hospital on 03/07/2022.  Since December 2022, she lost roughly 30 pounds due to lack of appetite.  Cardiology service was consulted during the hospitalization due to elevated troponin and LBBB.  She denied any chest pain or worsening dyspnea.  She was seen by Dr. Wonda.  Echocardiogram in March demonstrated EF 55 to 60%, no regional wall motion abnormality, moderate LVH, mild to moderate LAE, no significant valve issue.  Given the reassuring echocardiogram and the mildly elevated troponin in the setting of her CKD, it was unlikely that cardiac pathology was contributing to her failure to thrive.  During the hospitalization, patient also had parkinsonian features and resting tremors, MRI was contraindicated due to pacemaker.  Neurology recommended CT myelogram at some point as outpatient.  On subsequent outpatient visit her LFTs normalized.   She was readmitted 3/23-4/10/23 with acute angioedema with swelling involving her face, eye, neck and right arm. Required ventilator support. Treated with steroids and Pepcid  with improvement. Cause of her angioedema was never determined.   On follow up today she is doing very well. Tries to stay active. Denies any CP, SOB, palpitations, bleeding. Notes BP at home consistently in upper 120s systolic and 60 diastolic. No complaints today.  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; atrial fib (PAF)   History of blood transfusion as  a child   no abnormal reaction noted   History of colon polyps    Hyperlipidemia    takes Pravastatin  daily   Hypertension    takes Azor  and Metoprolol  daily   Hypothyroidism    takes Synthroid  daily   Joint pain    Nausea vomiting and diarrhea 08/23/2020   Presence of permanent cardiac pacemaker    Urinary frequency     Urinary urgency     Past Surgical History:  Procedure Laterality Date   ABDOMINAL HYSTERECTOMY     cataract surgery Bilateral    CHOLECYSTECTOMY  1971   COLONOSCOPY     EYE SURGERY     INSERT / REPLACE / REMOVE PACEMAKER     Medtronic PPM 11/01/14 (Dr. Danelle Birmingham)   JOINT REPLACEMENT Right    knee   LEFT HEART CATH AND CORONARY ANGIOGRAPHY N/A 11/01/2018   Procedure: LEFT HEART CATH AND CORONARY ANGIOGRAPHY;  Surgeon: Mady Bruckner, MD;  Location: [facility] INVASIVE CV LAB;  Service: Cardiovascular;  Laterality: N/A;   PERMANENT PACEMAKER INSERTION N/A 11/01/2014   Procedure: PERMANENT PACEMAKER INSERTION;  Surgeon: Danelle LELON Birmingham, MD;  Location: [facility] CATH LAB;  Service: Cardiovascular;  Laterality: N/A;   stomach stapled  1985   TOTAL KNEE ARTHROPLASTY Left 11/05/2015   Procedure: TOTAL KNEE ARTHROPLASTY;  Surgeon: Marcey Raman, MD;  Location: [facility] OR;  Service: Orthopedics;  Laterality: Left;     Current Outpatient Medications  Medication Sig Dispense Refill   acetaminophen  (TYLENOL ) 500 MG tablet Take 1 tablet (500 mg total) by mouth every 6 (six) hours as needed. 30 tablet 0   apixaban  (ELIQUIS ) 2.5 MG TABS tablet Take 1 tablet (2.5 mg total) by mouth 2 (two) times daily. 180 tablet 3   Cholecalciferol  (VITAMIN D3) 50 MCG (2000 UT) TABS Take 2,000 Units by mouth every morning.     Cyanocobalamin  (VITAMIN B12) 1000 MCG TBCR 1/2 tablet Orally Once a day     dorzolamide  (TRUSOPT ) 2 % ophthalmic solution Place 1 drop into the left eye 2 (two) times daily.     ferrous sulfate  325 (65 FE) MG tablet Take 325 mg by mouth daily with breakfast.     levothyroxine  (SYNTHROID , LEVOTHROID) 150 MCG tablet Take 150-225 mcg by mouth See admin instructions. Take one tablet (150 mcg) by mouth daily before breakfast, on 2 days of the week take an additional 1/2 tablet (75 mg) for a total dose on those days of 225 mcg  0   metoprolol  tartrate (LOPRESSOR ) 25 MG tablet Take 0.5 tablets (12.5 mg total) by  mouth 2 (two) times daily. 60 tablet 3   Multiple Vitamin (MULTIVITAMIN WITH MINERALS) TABS tablet Take 1 tablet by mouth every morning.     pantoprazole  (PROTONIX ) 40 MG tablet Take 40 mg by mouth every morning.  3   rosuvastatin  (CRESTOR ) 40 MG tablet Take 1 tablet (40 mg total) by mouth daily. 90 tablet 1   thiamine  (VITAMIN B-1) 100 MG tablet Take 100 mg by mouth daily.     vitamin C (ASCORBIC ACID ) 250 MG tablet Take 250 mg by mouth every morning.     No current facility-administered medications for this visit.    Allergies:   Clindamycin /lincomycin, Methimazole, Olmesartan , and Amlodipine     Social History:  The patient  reports that she has never smoked. She has never used smokeless tobacco. She reports current alcohol  use of about 1.0 standard drink of alcohol  per week. She reports that she does not use drugs.  Family History:  The patient's family history includes Heart attack in her father; Heart disease in her father; Stroke in her sister.    ROS:  Please see the history of present illness.   Otherwise, review of systems are positive for none.   All other systems are reviewed and negative.    PHYSICAL EXAM: VS:  BP (!) 148/84   Pulse 76   Ht 5' 4 (1.626 m)   Wt 170 lb 9.6 oz (77.4 kg)   SpO2 99%   BMI 29.28 kg/m  , BMI Body mass index is 29.28 kg/m. GENERAL:  Well appearing, overweight WF in NAD HEENT: Pupils equal round and reactive, fundi not visualized, oral mucosa unremarkable NECK:  No jugular venous distention, waveform within normal limits, carotid upstroke brisk and symmetric, no bruits LUNGS:  Clear to auscultation bilaterally HEART:  RRR.  PMI not displaced or sustained,S1 and S2 within normal limits, no S3, no S4, no clicks, no rubs, II/VI systolic murmur at LUSB ABD:  Flat, positive bowel sounds normal in frequency in pitch, no bruits, no rebound, no guarding, no midline pulsatile mass, no hepatomegaly, no splenomegaly EXT:  2 plus pulses throughout, no  edema, no cyanosis no clubbing SKIN:  No rashes no nodules NEURO:  Cranial nerves II through XII grossly intact, motor grossly intact throughout [facility]:  Cognitively intact, oriented to person place and time         Echo 10/30/14: Study Conclusions  - Left ventricle: The cavity size was normal. There was mild focal   basal hypertrophy of the septum. Systolic function was normal.   The estimated ejection fraction was in the range of 60% to 65%.   Wall motion was normal; there were no regional wall motion   abnormalities. - Aortic valve: Trileaflet; mildly thickened, mildly calcified   leaflets. Transvalvular velocity was minimally increased. There   was no stenosis. - Mitral valve: Calcified annulus. There was mild regurgitation. - Right ventricle: The cavity size was mildly dilated. Wall   thickness was normal.   Echo 09/13/18: Study Conclusions   - Left ventricle: The cavity size was normal. There was mild focal   basal hypertrophy of the septum. Systolic function was mildly   reduced. The estimated ejection fraction was in the range of 45%   to 50%. Diffuse hypokinesis. Doppler parameters are consistent   with both elevated ventricular end-diastolic filling pressure and   elevated left atrial filling pressure. - Mitral valve: Severely calcified annulus. Mildly thickened,   mildly calcified leaflets . - Left atrium: The atrium was mildly dilated. - Atrial septum: No defect or patent foramen ovale was identified.   Lexiscan  Myoview  10/07/18:   The left ventricular ejection fraction is mildly decreased (45-54%). Nuclear stress EF: 54%. No T wave inversion was noted during stress. There was no ST segment deviation noted during stress. Defect 1: There is a large defect of moderate severity.   Large size, moderate severity partially reversible inferior, apical and inferolateral perfusion defect, suggestive of ischemia. This could also represent pacemaker-related artifact. There  is apical akinesis and inferoapical dyskinesis. LVEF 54%. This is an intermediate risk study. Clinical correlation is advised.   LHC 10/2018: Conclusions: Mild to moderate, non-obstructive CAD.  Most severe lesion is a 50-60% stenosis in the proximal LCx that is not hemodynamically significant (DFR 1.0).  I suspect mild troponin elevation reflects supply-demand mismatch in the setting of uncontrolled hypertension and renal insufficiency. Normal left ventricular filling pressure.   Recommendations: Medical therapy  and risk factor modification to prevent progression of disease. Consider noninvasive evaluation for renal artery stenosis (if not already performed as an outpatient), given hypertension and renal insufficiency.   Echo 03/14/22: IMPRESSIONS     1. Left ventricular ejection fraction, by estimation, is 55 to 60%. The  left ventricle has normal function. The left ventricle has no regional  wall motion abnormalities. There is moderate concentric left ventricular  hypertrophy. Left ventricular  diastolic parameters are consistent with Grade I diastolic dysfunction  (impaired relaxation).   2. Right ventricular systolic function is normal. The right ventricular  size is normal. There is normal pulmonary artery systolic pressure.   3. Left atrial size was mild to moderately dilated.   4. The mitral valve is abnormal. No evidence of mitral valve  regurgitation.   5. The aortic valve is calcified. Aortic valve regurgitation is not  visualized. Mild aortic valve stenosis.   Recent Labs: No results found for requested labs within last 365 days.    Lipid Panel    Component Value Date/Time   CHOL 152 06/23/2023 1058   TRIG 60 06/23/2023 1058   HDL 63 06/23/2023 1058   CHOLHDL 2.4 06/23/2023 1058   CHOLHDL 2.3 10/30/2018 1852   VLDL 10 10/30/2018 1852   LDLCALC 77 06/23/2023 1058   07/22/2019: Total cholesterol 127, triglycerides 80, HDL 42, LDL 69 Dated 08/28/21: Hgb 11.2.  creatinine 1.5. BUN 30, otherwise CBC and CMET normal. Cholesterol 129, triglycerides 64, HDL 41, LDL 75. TSH normal. Dated 09/07/23: cholesterol 164, triglycerides 71, HDL 59, LDL 91. Apo B 79. CMET and TSh normal  Wt Readings from Last 3 Encounters:  07/01/24 170 lb 9.6 oz (77.4 kg)  12/31/23 175 lb (79.4 kg)  06/30/23 175 lb 3.2 oz (79.5 kg)      ASSESSMENT AND PLAN:  PAF:   Well controlled. Continue metoprolol   and Eliquis . On reduced Eliquis  due to age and fluctuating renal function No Afib noted on pacer follow up  2.  Labile Hypertension:   Blood pressure well controlled per patient checks. Mildly elevated today. Would avoid over medication given prior history   3. Non-obstructive CAD: Left heart cath 10/2018 showed nonobstructive disease. Continue rosuvastatin .  She is not on aspirin  given that she is on Eliquis .  4. Hyperlipidemia- on Crestor .    5. CHB s/p PPM:   Managed by Dr. Waddell.     Current medicines are reviewed at length with the patient today.  The patient does not have concerns regarding medicines.  The following changes have been made: none  Labs/ tests ordered today include:   No orders of the defined types were placed in this encounter.     Disposition:  follow up in one year    Signed, Dinita Migliaccio [place] MD, [facility]  07/01/2024 8:59 AM    [facility]

## 2024-07-01 ENCOUNTER — Encounter: Payer: Self-pay | Admitting: Cardiology

## 2024-07-01 ENCOUNTER — Ambulatory Visit: Attending: Cardiology | Admitting: Cardiology

## 2024-07-01 VITALS — BP 148/84 | HR 76 | Ht 64.0 in | Wt 170.6 lb

## 2024-07-01 DIAGNOSIS — I10 Essential (primary) hypertension: Secondary | ICD-10-CM

## 2024-07-01 DIAGNOSIS — I2510 Atherosclerotic heart disease of native coronary artery without angina pectoris: Secondary | ICD-10-CM

## 2024-07-01 DIAGNOSIS — I480 Paroxysmal atrial fibrillation: Secondary | ICD-10-CM

## 2024-07-01 DIAGNOSIS — E7800 Pure hypercholesterolemia, unspecified: Secondary | ICD-10-CM

## 2024-07-01 DIAGNOSIS — I442 Atrioventricular block, complete: Secondary | ICD-10-CM

## 2024-07-01 NOTE — Patient Instructions (Signed)
 Medication Instructions:  Continue same medications *If you need a refill on your cardiac medications before your next appointment, please call your pharmacy*  Lab Work: None ordered  Testing/Procedures: None ordered  Follow-Up: At [facility], you and your health needs are our priority.  As part of our continuing mission to provide you with exceptional heart care, our providers are all part of one team.  This team includes your primary Cardiologist (physician) and Advanced Practice Providers or APPs (Physician Assistants and Nurse Practitioners) who all work together to provide you with the care you need, when you need it.  Your next appointment:  1 year    Call in March to schedule July appointment     Provider:  Dr.Jordan   We recommend signing up for the patient portal called MyChart.  Sign up information is provided on this After Visit Summary.  MyChart is used to connect with patients for Virtual Visits (Telemedicine).  Patients are able to view lab/test results, encounter notes, upcoming appointments, etc.  Non-urgent messages can be sent to your provider as well.   To learn more about what you can do with MyChart, go to [email or web address].

## 2024-07-11 DIAGNOSIS — H348322 Tributary (branch) retinal vein occlusion, left eye, stable: Secondary | ICD-10-CM | POA: Diagnosis not present

## 2024-07-11 DIAGNOSIS — H52223 Regular astigmatism, bilateral: Secondary | ICD-10-CM | POA: Diagnosis not present

## 2024-07-11 DIAGNOSIS — Z9842 Cataract extraction status, left eye: Secondary | ICD-10-CM | POA: Diagnosis not present

## 2024-07-11 DIAGNOSIS — H353131 Nonexudative age-related macular degeneration, bilateral, early dry stage: Secondary | ICD-10-CM | POA: Diagnosis not present

## 2024-07-11 DIAGNOSIS — H40052 Ocular hypertension, left eye: Secondary | ICD-10-CM | POA: Diagnosis not present

## 2024-07-11 DIAGNOSIS — Z9841 Cataract extraction status, right eye: Secondary | ICD-10-CM | POA: Diagnosis not present

## 2024-08-02 ENCOUNTER — Ambulatory Visit: Payer: Medicare PPO

## 2024-08-02 DIAGNOSIS — I442 Atrioventricular block, complete: Secondary | ICD-10-CM | POA: Diagnosis not present

## 2024-08-05 LAB — CUP PACEART REMOTE DEVICE CHECK
Battery Impedance: 1843 Ohm
Battery Remaining Longevity: 36 mo
Battery Voltage: 2.75 V
Brady Statistic AP VP Percent: 29 %
Brady Statistic AP VS Percent: 0 %
Brady Statistic AS VP Percent: 69 %
Brady Statistic AS VS Percent: 2 %
Date Time Interrogation Session: 20250814160907
Implantable Lead Connection Status: 753985
Implantable Lead Connection Status: 753985
Implantable Lead Implant Date: 20151111
Implantable Lead Implant Date: 20151111
Implantable Lead Location: 753859
Implantable Lead Location: 753860
Implantable Lead Model: 5076
Implantable Lead Model: 5076
Implantable Pulse Generator Implant Date: 20151111
Lead Channel Impedance Value: 497 Ohm
Lead Channel Impedance Value: 624 Ohm
Lead Channel Pacing Threshold Amplitude: 0.625 V
Lead Channel Pacing Threshold Amplitude: 1.125 V
Lead Channel Pacing Threshold Pulse Width: 0.4 ms
Lead Channel Pacing Threshold Pulse Width: 0.4 ms
Lead Channel Setting Pacing Amplitude: 1.5 V
Lead Channel Setting Pacing Amplitude: 2.5 V
Lead Channel Setting Pacing Pulse Width: 0.46 ms
Lead Channel Setting Sensing Sensitivity: 4 mV
Zone Setting Status: 755011
Zone Setting Status: 755011

## 2024-08-07 ENCOUNTER — Ambulatory Visit: Payer: Self-pay | Admitting: Internal Medicine

## 2024-09-12 DIAGNOSIS — I5032 Chronic diastolic (congestive) heart failure: Secondary | ICD-10-CM | POA: Diagnosis not present

## 2024-09-12 DIAGNOSIS — I130 Hypertensive heart and chronic kidney disease with heart failure and stage 1 through stage 4 chronic kidney disease, or unspecified chronic kidney disease: Secondary | ICD-10-CM | POA: Diagnosis not present

## 2024-09-12 DIAGNOSIS — E7800 Pure hypercholesterolemia, unspecified: Secondary | ICD-10-CM | POA: Diagnosis not present

## 2024-09-12 DIAGNOSIS — E7849 Other hyperlipidemia: Secondary | ICD-10-CM | POA: Diagnosis not present

## 2024-09-12 DIAGNOSIS — D631 Anemia in chronic kidney disease: Secondary | ICD-10-CM | POA: Diagnosis not present

## 2024-09-12 DIAGNOSIS — E039 Hypothyroidism, unspecified: Secondary | ICD-10-CM | POA: Diagnosis not present

## 2024-09-12 DIAGNOSIS — E559 Vitamin D deficiency, unspecified: Secondary | ICD-10-CM | POA: Diagnosis not present

## 2024-09-12 DIAGNOSIS — N184 Chronic kidney disease, stage 4 (severe): Secondary | ICD-10-CM | POA: Diagnosis not present

## 2024-09-15 NOTE — Progress Notes (Signed)
 Remote PPM Transmission

## 2024-09-19 DIAGNOSIS — Z1331 Encounter for screening for depression: Secondary | ICD-10-CM | POA: Diagnosis not present

## 2024-09-19 DIAGNOSIS — Z7901 Long term (current) use of anticoagulants: Secondary | ICD-10-CM | POA: Diagnosis not present

## 2024-09-19 DIAGNOSIS — Z0000 Encounter for general adult medical examination without abnormal findings: Secondary | ICD-10-CM | POA: Diagnosis not present

## 2024-09-19 DIAGNOSIS — Z23 Encounter for immunization: Secondary | ICD-10-CM | POA: Diagnosis not present

## 2024-09-19 DIAGNOSIS — N184 Chronic kidney disease, stage 4 (severe): Secondary | ICD-10-CM | POA: Diagnosis not present

## 2024-09-19 DIAGNOSIS — I5032 Chronic diastolic (congestive) heart failure: Secondary | ICD-10-CM | POA: Diagnosis not present

## 2024-09-19 DIAGNOSIS — D6869 Other thrombophilia: Secondary | ICD-10-CM | POA: Diagnosis not present

## 2024-09-19 DIAGNOSIS — I130 Hypertensive heart and chronic kidney disease with heart failure and stage 1 through stage 4 chronic kidney disease, or unspecified chronic kidney disease: Secondary | ICD-10-CM | POA: Diagnosis not present

## 2024-09-19 DIAGNOSIS — Z1339 Encounter for screening examination for other mental health and behavioral disorders: Secondary | ICD-10-CM | POA: Diagnosis not present

## 2024-09-19 DIAGNOSIS — Z96652 Presence of left artificial knee joint: Secondary | ICD-10-CM | POA: Diagnosis not present

## 2024-09-19 DIAGNOSIS — E669 Obesity, unspecified: Secondary | ICD-10-CM | POA: Diagnosis not present

## 2024-09-19 DIAGNOSIS — R82998 Other abnormal findings in urine: Secondary | ICD-10-CM | POA: Diagnosis not present

## 2024-09-19 DIAGNOSIS — Z950 Presence of cardiac pacemaker: Secondary | ICD-10-CM | POA: Diagnosis not present

## 2024-09-19 DIAGNOSIS — I442 Atrioventricular block, complete: Secondary | ICD-10-CM | POA: Diagnosis not present

## 2024-09-19 DIAGNOSIS — I2510 Atherosclerotic heart disease of native coronary artery without angina pectoris: Secondary | ICD-10-CM | POA: Diagnosis not present

## 2024-09-19 DIAGNOSIS — H6120 Impacted cerumen, unspecified ear: Secondary | ICD-10-CM | POA: Diagnosis not present

## 2024-11-01 ENCOUNTER — Ambulatory Visit: Payer: Medicare PPO

## 2024-11-01 DIAGNOSIS — I442 Atrioventricular block, complete: Secondary | ICD-10-CM

## 2024-11-02 LAB — CUP PACEART REMOTE DEVICE CHECK
Battery Impedance: 1914 Ohm
Battery Remaining Longevity: 35 mo
Battery Voltage: 2.74 V
Brady Statistic AP VP Percent: 29 %
Brady Statistic AP VS Percent: 0 %
Brady Statistic AS VP Percent: 69 %
Brady Statistic AS VS Percent: 2 %
Date Time Interrogation Session: 20251112093658
Implantable Lead Connection Status: 753985
Implantable Lead Connection Status: 753985
Implantable Lead Implant Date: 20151111
Implantable Lead Implant Date: 20151111
Implantable Lead Location: 753859
Implantable Lead Location: 753860
Implantable Lead Model: 5076
Implantable Lead Model: 5076
Implantable Pulse Generator Implant Date: 20151111
Lead Channel Impedance Value: 514 Ohm
Lead Channel Impedance Value: 613 Ohm
Lead Channel Pacing Threshold Amplitude: 0.5 V
Lead Channel Pacing Threshold Amplitude: 1.25 V
Lead Channel Pacing Threshold Pulse Width: 0.4 ms
Lead Channel Pacing Threshold Pulse Width: 0.4 ms
Lead Channel Setting Pacing Amplitude: 1.5 V
Lead Channel Setting Pacing Amplitude: 2.5 V
Lead Channel Setting Pacing Pulse Width: 0.46 ms
Lead Channel Setting Sensing Sensitivity: 4 mV
Zone Setting Status: 755011
Zone Setting Status: 755011

## 2024-11-04 NOTE — Progress Notes (Signed)
 Remote PPM Transmission

## 2024-11-06 ENCOUNTER — Ambulatory Visit: Payer: Self-pay | Admitting: Internal Medicine

## 2024-12-11 ENCOUNTER — Other Ambulatory Visit: Payer: Self-pay | Admitting: Cardiology

## 2025-01-31 ENCOUNTER — Ambulatory Visit: Payer: Medicare PPO

## 2025-05-02 ENCOUNTER — Ambulatory Visit

## 2025-06-09 ENCOUNTER — Ambulatory Visit: Admitting: Cardiology

## 2025-08-01 ENCOUNTER — Ambulatory Visit

## 2025-10-31 ENCOUNTER — Ambulatory Visit

## 2026-01-30 ENCOUNTER — Ambulatory Visit

## 2026-05-01 ENCOUNTER — Ambulatory Visit

## 2026-07-31 ENCOUNTER — Ambulatory Visit

## 2026-10-30 ENCOUNTER — Ambulatory Visit

## 2027-01-29 ENCOUNTER — Ambulatory Visit
